# Patient Record
Sex: Male | Born: 1945
Health system: Southern US, Community
[De-identification: ages and names within clinical notes are randomized; demographics above are authoritative.]

## PROBLEM LIST (undated history)

## (undated) DIAGNOSIS — J449 Chronic obstructive pulmonary disease, unspecified: Secondary | ICD-10-CM

## (undated) DIAGNOSIS — G4733 Obstructive sleep apnea (adult) (pediatric): Secondary | ICD-10-CM

## (undated) DIAGNOSIS — I739 Peripheral vascular disease, unspecified: Secondary | ICD-10-CM

## (undated) DIAGNOSIS — I872 Venous insufficiency (chronic) (peripheral): Secondary | ICD-10-CM

## (undated) DIAGNOSIS — I10 Essential (primary) hypertension: Secondary | ICD-10-CM

## (undated) DIAGNOSIS — I639 Cerebral infarction, unspecified: Secondary | ICD-10-CM

## (undated) DIAGNOSIS — J189 Pneumonia, unspecified organism: Secondary | ICD-10-CM

## (undated) DIAGNOSIS — F1011 Alcohol abuse, in remission: Secondary | ICD-10-CM

## (undated) DIAGNOSIS — I219 Acute myocardial infarction, unspecified: Secondary | ICD-10-CM

## (undated) DIAGNOSIS — R7303 Prediabetes: Secondary | ICD-10-CM

## (undated) DIAGNOSIS — Z9981 Dependence on supplemental oxygen: Secondary | ICD-10-CM

## (undated) DIAGNOSIS — M1990 Unspecified osteoarthritis, unspecified site: Secondary | ICD-10-CM

## (undated) DIAGNOSIS — I82409 Acute embolism and thrombosis of unspecified deep veins of unspecified lower extremity: Secondary | ICD-10-CM

## (undated) DIAGNOSIS — I2510 Atherosclerotic heart disease of native coronary artery without angina pectoris: Secondary | ICD-10-CM

## (undated) DIAGNOSIS — I509 Heart failure, unspecified: Secondary | ICD-10-CM

## (undated) DIAGNOSIS — E785 Hyperlipidemia, unspecified: Secondary | ICD-10-CM

## (undated) DIAGNOSIS — R0600 Dyspnea, unspecified: Secondary | ICD-10-CM

## (undated) DIAGNOSIS — E119 Type 2 diabetes mellitus without complications: Secondary | ICD-10-CM

## (undated) HISTORY — DX: Obstructive sleep apnea (adult) (pediatric): G47.33

## (undated) HISTORY — DX: Chronic obstructive pulmonary disease, unspecified: J44.9

## (undated) HISTORY — PX: NOSE SURGERY: SHX723

## (undated) HISTORY — PX: ROTATOR CUFF REPAIR: SHX139

## (undated) HISTORY — DX: Essential (primary) hypertension: I10

## (undated) HISTORY — DX: Hyperlipidemia, unspecified: E78.5

## (undated) HISTORY — DX: Atherosclerotic heart disease of native coronary artery without angina pectoris: I25.10

## (undated) HISTORY — PX: TOTAL HIP ARTHROPLASTY: SHX124

## (undated) HISTORY — PX: LOBECTOMY: SHX5089

## (undated) HISTORY — PX: SPINE SURGERY: SHX786

## (undated) HISTORY — DX: Acute embolism and thrombosis of unspecified deep veins of unspecified lower extremity: I82.409

## (undated) HISTORY — PX: JOINT REPLACEMENT: SHX530

## (undated) HISTORY — DX: Alcohol abuse, in remission: F10.11

## (undated) HISTORY — DX: Heart failure, unspecified: I50.9

## (undated) HISTORY — PX: NEPHRECTOMY: SHX65

## (undated) HISTORY — DX: Cerebral infarction, unspecified: I63.9

## (undated) HISTORY — DX: Venous insufficiency (chronic) (peripheral): I87.2

## (undated) HISTORY — DX: Acute myocardial infarction, unspecified: I21.9
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
## 2003-11-19 ENCOUNTER — Other Ambulatory Visit: Payer: Self-pay

## 2003-12-26 DIAGNOSIS — F1011 Alcohol abuse, in remission: Secondary | ICD-10-CM

## 2003-12-26 HISTORY — DX: Alcohol abuse, in remission: F10.11

## 2004-10-25 ENCOUNTER — Ambulatory Visit (HOSPITAL_COMMUNITY): Admission: RE | Admit: 2004-10-25 | Discharge: 2004-10-25 | Payer: Self-pay | Admitting: Neurosurgery

## 2004-11-04 ENCOUNTER — Encounter: Payer: Self-pay | Admitting: Neurosurgery

## 2004-11-24 ENCOUNTER — Encounter: Payer: Self-pay | Admitting: Neurosurgery

## 2005-02-08 ENCOUNTER — Ambulatory Visit: Payer: Self-pay | Admitting: Anesthesiology

## 2005-03-02 ENCOUNTER — Ambulatory Visit: Payer: Self-pay | Admitting: Anesthesiology

## 2005-04-13 ENCOUNTER — Ambulatory Visit: Payer: Self-pay | Admitting: Anesthesiology

## 2005-05-11 ENCOUNTER — Ambulatory Visit: Payer: Self-pay | Admitting: Anesthesiology

## 2005-05-25 ENCOUNTER — Ambulatory Visit (HOSPITAL_COMMUNITY): Admission: RE | Admit: 2005-05-25 | Discharge: 2005-05-25 | Payer: Self-pay | Admitting: Pediatrics

## 2005-05-31 ENCOUNTER — Ambulatory Visit: Payer: Self-pay | Admitting: Pain Medicine

## 2005-08-30 ENCOUNTER — Other Ambulatory Visit: Payer: Self-pay

## 2005-08-30 ENCOUNTER — Ambulatory Visit: Payer: Self-pay | Admitting: Urology

## 2005-10-15 ENCOUNTER — Encounter: Admission: RE | Admit: 2005-10-15 | Discharge: 2005-10-15 | Payer: Self-pay | Admitting: Neurosurgery

## 2005-11-10 ENCOUNTER — Ambulatory Visit (HOSPITAL_COMMUNITY): Admission: RE | Admit: 2005-11-10 | Discharge: 2005-11-10 | Payer: Self-pay | Admitting: Neurosurgery

## 2005-12-25 HISTORY — PX: LUNG SURGERY: SHX703

## 2006-09-13 ENCOUNTER — Encounter: Payer: Self-pay | Admitting: Cardiovascular Disease

## 2007-05-03 ENCOUNTER — Ambulatory Visit: Payer: Self-pay | Admitting: General Practice

## 2007-05-03 ENCOUNTER — Other Ambulatory Visit: Payer: Self-pay

## 2007-05-17 ENCOUNTER — Ambulatory Visit: Payer: Self-pay | Admitting: General Practice

## 2007-05-31 ENCOUNTER — Encounter: Payer: Self-pay | Admitting: General Practice

## 2007-06-25 ENCOUNTER — Encounter: Payer: Self-pay | Admitting: General Practice

## 2008-07-15 ENCOUNTER — Emergency Department: Payer: Self-pay | Admitting: Emergency Medicine

## 2008-12-25 DIAGNOSIS — I639 Cerebral infarction, unspecified: Secondary | ICD-10-CM

## 2008-12-25 HISTORY — DX: Cerebral infarction, unspecified: I63.9

## 2009-11-16 DIAGNOSIS — I219 Acute myocardial infarction, unspecified: Secondary | ICD-10-CM

## 2009-11-16 HISTORY — DX: Acute myocardial infarction, unspecified: I21.9

## 2010-01-20 ENCOUNTER — Inpatient Hospital Stay: Payer: Self-pay | Admitting: Internal Medicine

## 2010-01-26 LAB — PULMONARY FUNCTION TEST

## 2010-02-10 ENCOUNTER — Encounter: Payer: Self-pay | Admitting: Cardiovascular Disease

## 2010-02-10 LAB — PULMONARY FUNCTION TEST

## 2010-08-09 ENCOUNTER — Encounter: Payer: Self-pay | Admitting: Cardiovascular Disease

## 2010-08-09 LAB — PULMONARY FUNCTION TEST

## 2010-10-24 ENCOUNTER — Inpatient Hospital Stay: Payer: Self-pay | Admitting: Internal Medicine

## 2011-03-01 ENCOUNTER — Ambulatory Visit: Payer: Self-pay | Admitting: Internal Medicine

## 2011-03-28 ENCOUNTER — Encounter: Payer: Self-pay | Admitting: Internal Medicine

## 2011-04-25 ENCOUNTER — Encounter: Payer: Self-pay | Admitting: Internal Medicine

## 2011-06-01 ENCOUNTER — Ambulatory Visit: Payer: Self-pay | Admitting: Internal Medicine

## 2011-08-29 ENCOUNTER — Other Ambulatory Visit: Payer: Self-pay | Admitting: Internal Medicine

## 2011-09-27 ENCOUNTER — Ambulatory Visit (INDEPENDENT_AMBULATORY_CARE_PROVIDER_SITE_OTHER): Payer: MEDICARE | Admitting: Internal Medicine

## 2011-09-27 ENCOUNTER — Encounter: Payer: Self-pay | Admitting: Internal Medicine

## 2011-09-27 VITALS — BP 119/76 | HR 77 | Temp 98.1°F | Resp 16 | Ht 72.0 in | Wt 276.8 lb

## 2011-09-27 DIAGNOSIS — I2589 Other forms of chronic ischemic heart disease: Secondary | ICD-10-CM

## 2011-09-27 DIAGNOSIS — E785 Hyperlipidemia, unspecified: Secondary | ICD-10-CM

## 2011-09-27 DIAGNOSIS — J449 Chronic obstructive pulmonary disease, unspecified: Secondary | ICD-10-CM | POA: Insufficient documentation

## 2011-09-27 DIAGNOSIS — I509 Heart failure, unspecified: Secondary | ICD-10-CM

## 2011-09-27 DIAGNOSIS — J3081 Allergic rhinitis due to animal (cat) (dog) hair and dander: Secondary | ICD-10-CM

## 2011-09-27 DIAGNOSIS — I10 Essential (primary) hypertension: Secondary | ICD-10-CM

## 2011-09-27 DIAGNOSIS — I5030 Unspecified diastolic (congestive) heart failure: Secondary | ICD-10-CM | POA: Insufficient documentation

## 2011-09-27 DIAGNOSIS — I255 Ischemic cardiomyopathy: Secondary | ICD-10-CM

## 2011-09-27 MED ORDER — CARVEDILOL 6.25 MG PO TABS: 6.2500 mg | ORAL_TABLET | Freq: Two times a day (BID) | ORAL | Status: DC

## 2011-09-27 MED ORDER — HYDRALAZINE HCL 25 MG PO TABS: 25.0000 mg | ORAL_TABLET | Freq: Two times a day (BID) | ORAL | Status: DC

## 2011-09-27 MED ORDER — LISINOPRIL 20 MG PO TABS: 20.0000 mg | ORAL_TABLET | Freq: Every day | ORAL | Status: DC

## 2011-09-27 MED ORDER — CLOPIDOGREL BISULFATE 75 MG PO TABS: 75.0000 mg | ORAL_TABLET | Freq: Every day | ORAL | Status: DC

## 2011-09-27 MED ORDER — FLUTICASONE-SALMETEROL 250-50 MCG/DOSE IN AEPB: 1.0000 | INHALATION_SPRAY | Freq: Two times a day (BID) | RESPIRATORY_TRACT | Status: DC

## 2011-09-27 MED ORDER — IPRATROPIUM-ALBUTEROL 18-103 MCG/ACT IN AERO: 2.0000 | INHALATION_SPRAY | Freq: Four times a day (QID) | RESPIRATORY_TRACT | Status: DC | PRN

## 2011-09-27 MED ORDER — SIMVASTATIN 20 MG PO TABS: 20.0000 mg | ORAL_TABLET | Freq: Every day | ORAL | Status: DC

## 2011-09-27 MED ORDER — HYDROCHLOROTHIAZIDE 25 MG PO TABS: 25.0000 mg | ORAL_TABLET | Freq: Every day | ORAL | Status: DC

## 2011-09-27 MED ORDER — CICLESONIDE 50 MCG/ACT NA SUSP: 2.0000 | Freq: Every day | NASAL | Status: DC

## 2011-09-27 MED ORDER — ISOSORBIDE MONONITRATE ER 60 MG PO TB24: 60.0000 mg | ORAL_TABLET | Freq: Every day | ORAL | Status: DC

## 2011-09-27 NOTE — Patient Instructions (Addendum)
Use Afrin sparingly, just once daily to prevent dependence on it.  We are going to try Omnaris, nasal spray  For your congestion and sneezing.  Use 2 squirts in each nostril once a day.  Your blood pressure is well controlled.    We will set you up with the [place] [facility] doctors in the next few months., and change your kidney doctor to [facility] nephrology.  They have an office in [place].

## 2011-09-27 NOTE — Assessment & Plan Note (Signed)
Ischemic cardiomyopathy, with EF of 35% by prior ECHO.  Reviewed the necessity of each of the medications he is taking,  He has requested a change in cardiologist  Will refer to [facility] for followup.

## 2011-09-27 NOTE — Assessment & Plan Note (Signed)
Well-controlled on current regimen. No changes today. 
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
## 2011-09-27 NOTE — Assessment & Plan Note (Signed)
Currently asymptomatic on current regimen.  He is requesting a change in pulmonologist .  Will refer to Dr. Kendrick Fries.

## 2011-09-28 ENCOUNTER — Other Ambulatory Visit: Payer: Self-pay | Admitting: Internal Medicine

## 2011-10-02 ENCOUNTER — Encounter: Payer: Self-pay | Admitting: Internal Medicine

## 2011-10-05 ENCOUNTER — Telehealth: Payer: Self-pay | Admitting: Pulmonary Disease

## 2011-10-05 NOTE — Telephone Encounter (Signed)
Noted  

## 2011-10-06 NOTE — Telephone Encounter (Signed)
Verlon Au, do you need anything else regarding this pt or msg?

## 2011-10-09 ENCOUNTER — Encounter: Payer: Self-pay | Admitting: Pulmonary Disease

## 2011-10-09 ENCOUNTER — Ambulatory Visit (INDEPENDENT_AMBULATORY_CARE_PROVIDER_SITE_OTHER): Payer: Medicare Other | Admitting: Pulmonary Disease

## 2011-10-09 VITALS — BP 126/72 | HR 84 | Temp 98.2°F | Ht 72.0 in | Wt 276.0 lb

## 2011-10-09 DIAGNOSIS — J310 Chronic rhinitis: Secondary | ICD-10-CM

## 2011-10-09 DIAGNOSIS — Z23 Encounter for immunization: Secondary | ICD-10-CM

## 2011-10-09 DIAGNOSIS — J449 Chronic obstructive pulmonary disease, unspecified: Secondary | ICD-10-CM

## 2011-10-09 MED ORDER — IPRATROPIUM BROMIDE 0.03 % NA SOLN: 2.0000 | Freq: Three times a day (TID) | NASAL | Status: DC | PRN

## 2011-10-09 NOTE — Progress Notes (Deleted)
  Subjective:    Patient ID: ATOM SOLIVAN, male    DOB: June 21, 1946, 65 y.o.   MRN: [id number]  HPI    Review of Systems  Constitutional: Negative for fever, chills, activity change, appetite change and unexpected weight change.  HENT: Positive for congestion. Negative for sore throat, rhinorrhea, sneezing, trouble swallowing, dental problem, voice change and postnasal drip.   Eyes: Negative for visual disturbance.  Respiratory: Positive for shortness of breath. Negative for cough and choking.   Cardiovascular: Negative for chest pain and leg swelling.  Gastrointestinal: Negative for nausea, vomiting and abdominal pain.  Genitourinary: Negative for difficulty urinating.  Musculoskeletal: Negative for arthralgias.  Skin: Negative for rash.  Psychiatric/Behavioral: Negative for behavioral problems and confusion.       Objective:   Physical Exam        Assessment & Plan:

## 2011-10-09 NOTE — Progress Notes (Signed)
Subjective:    Patient ID: Victor Daniels, male    DOB: 1946/12/05, 65 y.o.   MRN: [id number]  HPI 65 year old male with COPD, prior MI and stroke in 2010 presented to our office today for further evaluation of his COPD.  He states that he smoked heavily until 2010 when he had his stroke and heart attack.  At that point he quit smoking cigarettes and drinking alcohol and was referred to a pulmonologist (Dr. Mayo Ao) for likely COPD.  He states that he was initially placed on continuous oxygen therapy and inhalers at that point.  He began exercising on a regular basis at that point.  He now walks two miles per day every day while walking his dog, and notes that he usually can't go further than that due to burning in his lungs.  He says that cough does not bother him.  He does not have chest pain.  He notes a constant, daily, clear runny nose that is continuous and is refractory to 11-12 days of what sounds like appropriate use of a nasal steroid.  He does have some swelling in his legs which he says has improved with exercise over the last two years.    Review of Systems  Constitutional: Negative.  Negative for fever, fatigue and unexpected weight change.  HENT: Positive for congestion, rhinorrhea and postnasal drip.   Eyes: Negative.   Respiratory: Positive for chest tightness, shortness of breath and wheezing. Negative for cough.   Cardiovascular: Positive for leg swelling. Negative for chest pain.  Gastrointestinal: Negative.   Genitourinary: Negative.   Musculoskeletal: Negative.   Skin: Negative.   Neurological: Negative.   Hematological: Negative.   Psychiatric/Behavioral: Negative.        Objective:   Physical Exam Gen: well appearing, no acute distress HEENT: NCAT, PERRL, EOMi, OP clear, neck supple without masses PULM: Good air movement, few scattered insp crackles in bases bilaterally CV: RRR, II/VII systolic murmur LUSB, no JVD AB: BS+, soft, nontender, no hsm Ext:  warm, no clubbing or cyanosis; he does have chronic venous stasis changes in his legs bilaterally Derm: no rash or skin breakdown Neuro: A&Ox4, CN II-XII intact, strength 5/5 in all 4 extremeties        Assessment & Plan:  Impression:  1) COPD 2) CHF 3) HTN 4) Obesity 5) Vasomotor Rhinitis.  Mr. Brum is a very pleasant 65 y/o male with severe COPD and vasomotor rhinitis here for evaluation of the same.  His most recent pulmonary function tests show an FEV1 of 33% predicted, consistent with Gold Stage III, severe COPD.  His most significant symptom is shortness of breath with exertion and inability to do "what he used to do".  Of note, he walks two miles daily which is impressive for someone of his age with severe lung obstruction.  I explained to him that considering his lung function, he may derive some benefit from the addition of tiotropium to his regimen in addition to continued weight loss and exercise.  A recent Cochrane review metanalysis showed that combination therapy in patients with class II-IV COPD derived some benefit in post bronchodilator FEV1 and quality of life Yvetta Coder CJ, Cochrane Database Syst Rev. 2012).    His continuous rhinorrhea does not appear to be responding to nasal steroid use (he claims to be using the med appropriately).  The clear, continuous drainage is consistent with vasomotor rhinitis and merits a trial of nasal ipratropium.    1) COPD: GOLD  Stage III Combined recommendations from the [employer], [employer] of  Chest Physicians, Designer, television/film set, European Respiratory Society (Qaseem A et al,  Ann Intern Med. 2011;155(3):179) recommends tobacco cessation, pulmonary rehab (for symptomatic patients with an FEV1 < 50% predicted), supplemental oxygen (for patients with SaO2 <88% or paO2 <55), and appropriate bronchodilator therapy.  In regards to long acting bronchodilators, they recommend monotherapy (FEV1 60-80%  with symptoms weak evidence,  FEV1 with symptoms <60% strong evidence), or combination therapy (FEV1 <60% with symptoms, strong recommendation, moderate evidence). One should also provide patients with annual immunizations and consider therapy for prevention of COPD exacerbations (ie. roflumilast or azithromycin) when appopriate. -O2 therapy: O2 therapy not indicated -Immunizations: Flu shot updated today -Tobacco use: quit since 2010 -Exercise: has undergone pulmonary rehab, currently exercises daily -Bronchodilator therapy: currently on fluticasone/salmeterol which is appropriate, I suggested  tiotropium as noted above, but he would prefer to lose weight and push his exercise routine  before trying this. -Exacerbation prevention: currently not indicated  2) Vasomotor rhinitis (chronic non-allergic rhinitis): When inhaled corticosteroids or antihistamines are not helpful, ipratropium nasal spray (0.03%, 2 puffs each nare tid) is often effective as demonstrated in two trials of 285 and 253 patients (J Allergy Clin Immunol. 1610;96(0 Pt 2):1123. nd J Allergy Clin Immunol. 4540;98(1 Pt 2):1117). -d/c nasal steroid (samples given recently) -start ipratropium nasal spray (0.03%) 2 puffs bid-tid prn  3) RTC 2 months

## 2011-10-09 NOTE — Patient Instructions (Addendum)
1) Continue using your Advair and Combivent as you are doing 2) Continue exercising on a regular basis as you are doing 3) Gradually increase your exercise to a light jog as you plan, but stop if you have excessive shortness of breath or chest pain.  4) Notify our office or any of your doctors if you have chest pain with exertion 5) Flu shot today 6) Start taking ipratropium nasal spray 2 puffs two to three times a day for your runny nose 7) We will see you back in 2 months

## 2011-10-10 NOTE — Progress Notes (Signed)
Called [facility] and gave verbal prescription for atrovent nasal spray, 2 puffs ea nare tid, 3 bottles, refills: 3

## 2011-10-12 ENCOUNTER — Encounter: Payer: Self-pay | Admitting: Cardiovascular Disease

## 2011-10-12 ENCOUNTER — Ambulatory Visit (INDEPENDENT_AMBULATORY_CARE_PROVIDER_SITE_OTHER): Payer: Medicare Other | Admitting: Cardiovascular Disease

## 2011-10-12 DIAGNOSIS — I509 Heart failure, unspecified: Secondary | ICD-10-CM

## 2011-10-12 DIAGNOSIS — I10 Essential (primary) hypertension: Secondary | ICD-10-CM

## 2011-10-12 DIAGNOSIS — E785 Hyperlipidemia, unspecified: Secondary | ICD-10-CM

## 2011-10-12 DIAGNOSIS — J449 Chronic obstructive pulmonary disease, unspecified: Secondary | ICD-10-CM

## 2011-10-12 DIAGNOSIS — I428 Other cardiomyopathies: Secondary | ICD-10-CM

## 2011-10-12 DIAGNOSIS — I2510 Atherosclerotic heart disease of native coronary artery without angina pectoris: Secondary | ICD-10-CM

## 2011-10-12 NOTE — Assessment & Plan Note (Signed)
Symptoms suggest mild COPD. He is improved on his inhalers including Advair.

## 2011-10-12 NOTE — Progress Notes (Signed)
Patient ID: Victor Daniels, male    DOB: [birth year], 65 y.o.   MRN: [id number]  HPI Comments: 65 year old gentleman referred by Dr. Darrick Huntsman, with a history of coronary artery disease, non-Q wave MI in the setting of multiorgan failure in November 2010 in [place], at that time with congestive heart failure and LV dysfunction with ejection fraction 25%, COPD, single kidney with previous renal failure in. On dialysis, CVA x2 with short-term memory loss, history of heavy alcohol abuse and smoking who presents to establish care.  He reports that he did have a significant problem with alcohol and was a heavy smoker though he quit in November. He does not remember much of the details and he was hospitalized in [place] in 2010. He was told that he had a heart attack but he does not remember having a cardiac catheterization. Notes indicate he had a non-Q-wave MI that was likely secondary to multiorgan failure. His low ejection fraction estimated at 25% several years ago has not been reevaluated since that time.   He reports that he exercises on a regular basis, does have mild shortness of breath with heavy exertion, occasional chest tightness with heavy exertion. His lower extremity edema him a which had been chronic with signs of chronic skin changes from venous insufficiency, has significantly improved. He has not been smoking for several years. He does have significant bruising from Plavix. He was off isosorbide for a period of time when this ran out though this was renewed and he has started taking this again.   EKG shows normal sinus rhythm with rate 77 beats per minute with incomplete right bundle branch block, no significant ST or T wave changes   Outpatient Encounter Prescriptions as of 10/12/2011  Medication Sig Dispense Refill  . albuterol-ipratropium (COMBIVENT) 18-103 MCG/ACT inhaler Inhale 2 puffs into the lungs every 6 (six) hours as needed.  3 Inhaler  3  . carvedilol (COREG) 6.25 MG  tablet Take 1 tablet (6.25 mg total) by mouth 2 (two) times daily with a meal.  180 tablet  3  . ciclesonide (OMNARIS) 50 MCG/ACT nasal spray Place 2 sprays into both nostrils daily.  12.5 g  0  . clopidogrel (PLAVIX) 75 MG tablet Take 1 tablet (75 mg total) by mouth daily.  90 tablet  3  . Fluticasone-Salmeterol (ADVAIR DISKUS) 250-50 MCG/DOSE AEPB Inhale 1 puff into the lungs 2 (two) times daily.  60 each  3  . hydrALAZINE (APRESOLINE) 25 MG tablet Take 1 tablet (25 mg total) by mouth 2 (two) times daily.  180 tablet  3  . hydrochlorothiazide (HYDRODIURIL) 25 MG tablet Take 1 tablet (25 mg total) by mouth daily.  90 tablet  3  . isosorbide mononitrate (IMDUR) 60 MG 24 hr tablet Take 1 tablet (60 mg total) by mouth daily.  90 tablet  3  . lisinopril (PRINIVIL,ZESTRIL) 20 MG tablet Take 1 tablet (20 mg total) by mouth daily.  90 tablet  3  . simvastatin (ZOCOR) 20 MG tablet Take 1 tablet (20 mg total) by mouth at bedtime.  90 tablet  3  . ipratropium (ATROVENT) 0.03 % nasal spray Place 2 sprays into the nose 3 (three) times daily as needed for rhinitis. prn  90 mL  3     Review of Systems  Constitutional: Negative.   HENT: Negative.   Eyes: Negative.   Respiratory: Positive for shortness of breath.   Cardiovascular: Positive for chest pain.  Gastrointestinal: Negative.   Musculoskeletal:  Negative.   Skin: Negative.   Neurological: Negative.   Hematological: Negative.   Psychiatric/Behavioral: Negative.   All other systems reviewed and are negative.    BP 142/83  Pulse 77  Ht 6\' 1"  (1.854 m)  Wt 274 lb (124.286 kg)  BMI 36.15 kg/m2  Physical Exam  Nursing note and vitals reviewed. Constitutional: He is oriented to person, place, and time. He appears well-developed and well-nourished.       obesity  HENT:  Head: Normocephalic.  Nose: Nose normal.  Mouth/Throat: Oropharynx is clear and moist.  Eyes: Conjunctivae are normal. Pupils are equal, round, and reactive to light.    Neck: Normal range of motion. Neck supple. No JVD present.  Cardiovascular: Normal rate, regular rhythm, S1 normal, S2 normal, normal heart sounds and intact distal pulses.  Exam reveals no gallop and no friction rub.   No murmur heard.      Discoloration of his lower extremities secondary to venous insufficiency, chronic  Pulmonary/Chest: Effort normal and breath sounds normal. No respiratory distress. He has no wheezes. He has no rales. He exhibits no tenderness.  Abdominal: Soft. Bowel sounds are normal. He exhibits no distension. There is no tenderness.  Musculoskeletal: Normal range of motion. He exhibits edema. He exhibits no tenderness.  Lymphadenopathy:    He has no cervical adenopathy.  Neurological: He is alert and oriented to person, place, and time. Coordination normal.  Skin: Skin is warm and dry. No rash noted. No erythema.  Psychiatric: He has a normal mood and affect. His behavior is normal. Judgment and thought content normal.           Assessment and Plan

## 2011-10-12 NOTE — Assessment & Plan Note (Signed)
Blood pressure is well controlled on today's visit. No changes made to the medications. 

## 2011-10-12 NOTE — Assessment & Plan Note (Signed)
Given his history of non-Q-wave MI, lung history of smoking, obesity, ideally  goal LDL should be close to 70. He is scheduled to have lab work done in followup with Dr. Darrick Huntsman

## 2011-10-12 NOTE — Patient Instructions (Addendum)
You are doing well. No medication changes were made. Please call us if you have new issues that need to be addressed before your next appt.  The office will contact you for a follow up Appt. In 6 months  Your physician has requested that you have an echocardiogram. Echocardiography is a painless test that uses sound waves to create images of your heart. It provides your doctor with information about the size and shape of your heart and how well your heart's chambers and valves are working. This procedure takes approximately one hour. There are no restrictions for this procedure.

## 2011-10-12 NOTE — Assessment & Plan Note (Signed)
I suspect his depressed ejection fraction several years ago could have been secondary to significant alcohol intake. We have ordered an echocardiogram to reevaluate his LV function given the significant prior cardiomyopathy. If his ejection fraction has improved, we would not need to consider an ICD.

## 2011-10-12 NOTE — Assessment & Plan Note (Signed)
Details of his non-Q-wave MI in 2010 are unavailable. We'll try to obtain these records for our system.  I suspect given his long smoking history, he does have underlying coronary artery disease and in the setting of multiorgan failure, he had elevation of his cardiac enzymes. He is unaware if he had a cardiac catheterization at that time or not. As he is essentially pain-free and is exercising without complaints, we will hold off on any stress testing until we have all of his records.

## 2011-10-17 ENCOUNTER — Other Ambulatory Visit: Payer: Medicare Other | Admitting: *Deleted

## 2011-10-26 ENCOUNTER — Ambulatory Visit: Payer: Medicare Other | Admitting: Internal Medicine

## 2011-10-26 ENCOUNTER — Emergency Department: Payer: Self-pay | Admitting: Emergency Medicine

## 2011-10-26 ENCOUNTER — Encounter: Payer: Self-pay | Admitting: Internal Medicine

## 2011-10-29 ENCOUNTER — Encounter: Payer: Self-pay | Admitting: Internal Medicine

## 2011-10-31 ENCOUNTER — Encounter: Payer: Self-pay | Admitting: Internal Medicine

## 2011-10-31 ENCOUNTER — Other Ambulatory Visit (INDEPENDENT_AMBULATORY_CARE_PROVIDER_SITE_OTHER): Payer: Medicare Other | Admitting: *Deleted

## 2011-10-31 ENCOUNTER — Ambulatory Visit (INDEPENDENT_AMBULATORY_CARE_PROVIDER_SITE_OTHER): Payer: Medicare Other | Admitting: Internal Medicine

## 2011-10-31 VITALS — BP 114/70 | HR 90 | Temp 98.3°F | Resp 16 | Ht 73.0 in | Wt 275.8 lb

## 2011-10-31 DIAGNOSIS — R0602 Shortness of breath: Secondary | ICD-10-CM

## 2011-10-31 DIAGNOSIS — I872 Venous insufficiency (chronic) (peripheral): Secondary | ICD-10-CM

## 2011-10-31 DIAGNOSIS — R51 Headache: Secondary | ICD-10-CM

## 2011-10-31 DIAGNOSIS — R6889 Other general symptoms and signs: Secondary | ICD-10-CM

## 2011-10-31 NOTE — Patient Instructions (Addendum)
Stop the hydralazine to see if the headaches resolve.  Check your blood pressure during one of these episodes to see iif it si elevated or low.    I am checking a blood test for the flu today.

## 2011-10-31 NOTE — Progress Notes (Signed)
Subjective:    Patient ID: Victor Daniels, male    DOB: 24-Aug-1946, 65 y.o.   MRN: [id number]  HPI   65 yo white male with history of dilated CM EF 25%, COPD, sent to ER last week after presenting to ffice with hypotension and near syncope.  He was evaluated, given IV fluids and antibiotics and sent home from the ER with a prescription for  Avelox for PNA.  Bps at home have been in the 115 systolic.  He feel better but not completely back to baseline.  HIs chief complaint today is leg pain.  He states that both lower legs have been very sensitive to the touch and feel like they are burning.  The sensation is constant and has been present for 2 years. He saw Dr.  Wyn Quaker 2 yrs ago but no intervention was done because his condition wasn't severe enough. His 2nd  complaint is the occurrence of  twice daily flushing and severe headache which occur about 15 minutes after he takes his meds and last about 30 minutes.     Past Medical History  Diagnosis Date  . COPD (chronic obstructive pulmonary disease)   . CHF (congestive heart failure)     ischemic CM.  EF 25%  . Hyperlipidemia   . Hypertension   . Heart attack 11/16/09  . OSA (obstructive sleep apnea)     not on CPAP  . DVT (deep venous thrombosis)   . Alcoholic gastritis   . CVA (cerebral infarction)     residual short term memory loss  . CAD (coronary artery disease)     Current Outpatient Prescriptions on File Prior to Visit  Medication Sig Dispense Refill  . albuterol-ipratropium (COMBIVENT) 18-103 MCG/ACT inhaler Inhale 2 puffs into the lungs every 6 (six) hours as needed.  3 Inhaler  3  . carvedilol (COREG) 6.25 MG tablet Take 1 tablet (6.25 mg total) by mouth 2 (two) times daily with a meal.  180 tablet  3  . clopidogrel (PLAVIX) 75 MG tablet Take 1 tablet (75 mg total) by mouth daily.  90 tablet  3  . Fluticasone-Salmeterol (ADVAIR DISKUS) 250-50 MCG/DOSE AEPB Inhale 1 puff into the lungs 2 (two) times daily.  60 each  3  .  hydrALAZINE (APRESOLINE) 25 MG tablet Take 1 tablet (25 mg total) by mouth 2 (two) times daily.  180 tablet  3  . hydrochlorothiazide (HYDRODIURIL) 25 MG tablet Take 1 tablet (25 mg total) by mouth daily.  90 tablet  3  . ipratropium (ATROVENT) 0.03 % nasal spray Place 2 sprays into the nose 3 (three) times daily as needed for rhinitis. prn  90 mL  3  . isosorbide mononitrate (IMDUR) 60 MG 24 hr tablet Take 1 tablet (60 mg total) by mouth daily.  90 tablet  3  . lisinopril (PRINIVIL,ZESTRIL) 20 MG tablet Take 1 tablet (20 mg total) by mouth daily.  90 tablet  3  . simvastatin (ZOCOR) 20 MG tablet Take 1 tablet (20 mg total) by mouth at bedtime.  90 tablet  3     Review of Systems  Constitutional: Negative for fever, chills, diaphoresis, activity change, appetite change, fatigue and unexpected weight change.  HENT: Negative for hearing loss, ear pain, nosebleeds, congestion, sore throat, facial swelling, rhinorrhea, sneezing, drooling, mouth sores, trouble swallowing, neck pain, neck stiffness, dental problem, voice change, postnasal drip, sinus pressure, tinnitus and ear discharge.   Eyes: Negative for photophobia, pain, discharge, redness, itching and visual disturbance.  Respiratory: Negative for apnea, cough, choking, chest tightness, shortness of breath, wheezing and stridor.   Cardiovascular: Negative for chest pain, palpitations and leg swelling.  Gastrointestinal: Negative for nausea, vomiting, abdominal pain, diarrhea, constipation, blood in stool, abdominal distention, anal bleeding and rectal pain.  Genitourinary: Negative for dysuria, urgency, frequency, hematuria, flank pain, decreased urine volume, scrotal swelling, difficulty urinating and testicular pain.  Musculoskeletal: Negative for myalgias, back pain, joint swelling, arthralgias and gait problem.  Skin: Negative for color change, rash and wound.  Neurological: Negative for dizziness, tremors, seizures, syncope, speech  difficulty, weakness, light-headedness, numbness and headaches.  Psychiatric/Behavioral: Negative for suicidal ideas, hallucinations, behavioral problems, confusion, sleep disturbance, dysphoric mood, decreased concentration and agitation. The patient is not nervous/anxious.    BP 114/70  Pulse 90  Temp(Src) 98.3 F (36.8 C) (Oral)  Resp 16  Ht 6\' 1"  (1.854 m)  Wt 275 lb 12 oz (125.079 kg)  BMI 36.38 kg/m2  SpO2 93%     Objective:   Physical Exam  Constitutional: He is oriented to person, place, and time.  HENT:  Head: Normocephalic and atraumatic.  Mouth/Throat: Oropharynx is clear and moist.  Eyes: Conjunctivae and EOM are normal.  Neck: Normal range of motion. Neck supple. No JVD present. No thyromegaly present.  Cardiovascular: Normal rate, regular rhythm and normal heart sounds.   Pulmonary/Chest: Effort normal and breath sounds normal. He has no wheezes. He has no rales.  Abdominal: Soft. Bowel sounds are normal. He exhibits no mass. There is no tenderness. There is no rebound.  Musculoskeletal: Normal range of motion. He exhibits no edema.  Neurological: He is alert and oriented to person, place, and time.  Skin: Skin is warm and dry.     Psychiatric: He has a normal mood and affect.          Assessment & Plan:   Pneumonia:  diagnosed during ER evaluation for hypotension :  He has been taking Avelox for treatment and notes improved blood pressures and symptoms.  He will need a 6 week followup chest x ray.  Venous insufficiency: he is requesting referral back to Dr. Wyn Quaker.  He is noncompliant with compression stockings.  Flushing,headaches:  I suspect that this is a reaction to imdur and hydralazine.  Will have him suspend Imdur for a week to see if symptoms resolve.

## 2011-11-06 ENCOUNTER — Encounter: Payer: Self-pay | Admitting: Cardiovascular Disease

## 2011-11-08 LAB — INFLUENZA B ABS IGG & IGM

## 2011-11-08 LAB — INFLUENZA A ABS IGG & IGM

## 2011-12-06 ENCOUNTER — Ambulatory Visit (INDEPENDENT_AMBULATORY_CARE_PROVIDER_SITE_OTHER): Payer: Medicare Other | Admitting: Pulmonary Disease

## 2011-12-06 ENCOUNTER — Encounter: Payer: Self-pay | Admitting: Pulmonary Disease

## 2011-12-06 DIAGNOSIS — J449 Chronic obstructive pulmonary disease, unspecified: Secondary | ICD-10-CM

## 2011-12-06 DIAGNOSIS — J300 Vasomotor rhinitis: Secondary | ICD-10-CM | POA: Insufficient documentation

## 2011-12-06 DIAGNOSIS — J309 Allergic rhinitis, unspecified: Secondary | ICD-10-CM

## 2011-12-06 NOTE — Assessment & Plan Note (Addendum)
GOLD stage III. Doing well on Advair and prn combivent.  Currently exercising more and says that he is not limited by shortness of breath.  I see no indication to change his medications. Recently treated for pneumonia, but he says that he did not have fever, chills, or productive cough.  At baseline he has pleural thickening and atelctasis/scarring in the R base. I see no significant change on the 10/26/11 film compared to a 05/2011 film, so no reason to repeat CXR. Return to clinic in 3 months.

## 2011-12-06 NOTE — Assessment & Plan Note (Signed)
Doing well on ipratropium nasal spray, continue.

## 2011-12-06 NOTE — Progress Notes (Signed)
Subjective:    Patient ID: Victor Daniels, male    DOB: 01-03-1946, 65 y.o.   MRN: [id number]  HPI 11/06/11 ROV --Very pleasant male with GOLD stage III COPD and vasomotor rhinitis presents for follow up.  He states that he has been exercising more and is not limited by shortness of breath.  He rarely uses his combivent inhaler more than once a day, twice is rare.  He has been raking leaves, walking for exercise.  He notes that the ipratropium nasal spray has been very helpful for his allergic rhinitis.  He was taken to [facility] on 10/26/11 for syncope and was treated for bronchitis with avelox, but it sounds like he didn't have cough, shortness of breath or fever.   Review of Systems     Objective:   Physical Exam Filed Vitals:   12/06/11 1332  BP: 124/72  Pulse: 73  Temp: 97.8 F (36.6 C)  TempSrc: Oral  Height: 6\' 1"  (1.854 m)  Weight: 274 lb (124.286 kg)  SpO2: 96%    Gen: well appearing, no acute distress HEENT: NCAT, PERRL, EOMi, OP clear, Neck: supple without masses PULM: CTA B CV: RRR, no mgr, no JVD Ext: warm, no clubbing, no cyanosis   I have personally reviewed the images 10/26/2011 CXR from [facility], see below.    Assessment & Plan:   COPD (chronic obstructive pulmonary disease) GOLD stage III. Doing well on Advair and prn combivent.  Currently exercising more and says that he is not limited by shortness of breath.  I see no indication to change his medications. Recently treated for pneumonia, but he says that he did not have fever, chills, or productive cough.  At baseline he has pleural thickening and atelctasis/scarring in the R base. I see no significant change on the 10/26/11 film compared to a 05/2011 film, so no reason to repeat CXR. Return to clinic in 3 months.  Vasomotor rhinitis Doing well on ipratropium nasal spray, continue.    Updated Medication List Outpatient Encounter Prescriptions as of 12/06/2011  Medication Sig Dispense Refill  .  albuterol (PROVENTIL HFA;VENTOLIN HFA) 108 (90 BASE) MCG/ACT inhaler Inhale 2 puffs into the lungs every 6 (six) hours as needed.        Marland Kitchen albuterol-ipratropium (COMBIVENT) 18-103 MCG/ACT inhaler Inhale 2 puffs into the lungs every 6 (six) hours as needed.  3 Inhaler  3  . carvedilol (COREG) 6.25 MG tablet Take 1 tablet (6.25 mg total) by mouth 2 (two) times daily with a meal.  180 tablet  3  . clopidogrel (PLAVIX) 75 MG tablet Take 1 tablet (75 mg total) by mouth daily.  90 tablet  3  . Fluticasone-Salmeterol (ADVAIR DISKUS) 250-50 MCG/DOSE AEPB Inhale 1 puff into the lungs 2 (two) times daily.  60 each  3  . hydrALAZINE (APRESOLINE) 25 MG tablet Take 1 tablet (25 mg total) by mouth 2 (two) times daily.  180 tablet  3  . hydrochlorothiazide (HYDRODIURIL) 25 MG tablet Take 1 tablet (25 mg total) by mouth daily.  90 tablet  3  . ipratropium (ATROVENT) 0.03 % nasal spray Place 2 sprays into the nose 3 (three) times daily as needed for rhinitis. prn  90 mL  3  . isosorbide mononitrate (IMDUR) 60 MG 24 hr tablet Take 1 tablet (60 mg total) by mouth daily.  90 tablet  3  . lisinopril (PRINIVIL,ZESTRIL) 20 MG tablet Take 1 tablet (20 mg total) by mouth daily.  90 tablet  3  .  moxifloxacin (AVELOX) 400 MG tablet Take 400 mg by mouth daily.        . simvastatin (ZOCOR) 20 MG tablet Take 1 tablet (20 mg total) by mouth at bedtime.  90 tablet  3

## 2011-12-06 NOTE — Patient Instructions (Signed)
Continue exercising regularly and taking your medications as written. We will see you back in 3 months, call sooner if questions or if you need to see [place].

## 2012-01-01 ENCOUNTER — Other Ambulatory Visit (INDEPENDENT_AMBULATORY_CARE_PROVIDER_SITE_OTHER): Payer: Medicare Other | Admitting: *Deleted

## 2012-01-01 ENCOUNTER — Telehealth: Payer: Self-pay | Admitting: *Deleted

## 2012-01-01 DIAGNOSIS — E785 Hyperlipidemia, unspecified: Secondary | ICD-10-CM

## 2012-01-01 DIAGNOSIS — I10 Essential (primary) hypertension: Secondary | ICD-10-CM

## 2012-01-01 LAB — LIPID PANEL
Cholesterol: 143 mg/dL (ref 0–200)
LDL Cholesterol: 79 mg/dL (ref 0–99)
Total CHOL/HDL Ratio: 3
VLDL: 15.8 mg/dL (ref 0.0–40.0)

## 2012-01-01 NOTE — Telephone Encounter (Signed)
Cmet, has a GFR as part of it,  Fasting lipids,

## 2012-01-01 NOTE — Telephone Encounter (Signed)
Labs ordered.

## 2012-01-01 NOTE — Telephone Encounter (Signed)
Patient is also asking if we can check his GFR level

## 2012-01-01 NOTE — Telephone Encounter (Signed)
Patient is coming in today for fasting labs. There is no order. What would you like for me to order.

## 2012-01-02 LAB — COMPREHENSIVE METABOLIC PANEL
ALT: 18 U/L (ref 0–53)
AST: 15 U/L (ref 0–37)
Albumin: 3.8 g/dL (ref 3.5–5.2)
Alkaline Phosphatase: 64 U/L (ref 39–117)
Potassium: 3.7 mEq/L (ref 3.5–5.1)
Sodium: 143 mEq/L (ref 135–145)
Total Bilirubin: 0.5 mg/dL (ref 0.3–1.2)
Total Protein: 7 g/dL (ref 6.0–8.3)

## 2012-01-08 ENCOUNTER — Encounter: Payer: Self-pay | Admitting: Internal Medicine

## 2012-01-08 ENCOUNTER — Telehealth: Payer: Self-pay | Admitting: *Deleted

## 2012-01-08 ENCOUNTER — Ambulatory Visit (INDEPENDENT_AMBULATORY_CARE_PROVIDER_SITE_OTHER): Payer: Medicare Other | Admitting: Internal Medicine

## 2012-01-08 DIAGNOSIS — I10 Essential (primary) hypertension: Secondary | ICD-10-CM

## 2012-01-08 DIAGNOSIS — I509 Heart failure, unspecified: Secondary | ICD-10-CM

## 2012-01-08 DIAGNOSIS — E785 Hyperlipidemia, unspecified: Secondary | ICD-10-CM

## 2012-01-08 DIAGNOSIS — I878 Other specified disorders of veins: Secondary | ICD-10-CM | POA: Insufficient documentation

## 2012-01-08 DIAGNOSIS — I872 Venous insufficiency (chronic) (peripheral): Secondary | ICD-10-CM

## 2012-01-08 NOTE — Patient Instructions (Addendum)
You can try stopping the hydralazine for blood pressure because it also causes fluid retention.  If your blood pressure rises and stays above 140,  Try increasing the carvedilol to 12.5 mg twice daily and recheck bp and pulse daily for monitoring  Goal HR above 60  And bp below 145/80

## 2012-01-08 NOTE — Telephone Encounter (Signed)
When I checked my medications I discovered that I had stopped taking hydralazine back in October. So it seems we need a new game plan....please advise.  (This is a message that patient sent through my chart)

## 2012-01-08 NOTE — Telephone Encounter (Signed)
We were stopping the hydralazine bc it was the least significant medication he was taking  All of his others are necessary for patients with coronary artery disease.  I would not stop any of them.

## 2012-01-08 NOTE — Assessment & Plan Note (Signed)
Secondary to ischemic cardiomyopathy, EF 35%, with no recent exacerbations.  No changes today.

## 2012-01-08 NOTE — Progress Notes (Signed)
Subjective:    Patient ID: Victor Daniels, male    DOB: 1946-05-02, 66 y.o.   MRN: [id number]  HPI  Victor Daniels is a 66 yo white male with a history of COPD secondary to prior tobacco abuse, CAD with ischemic cardioymopathy and reduced EF, and CVC with prior CVA who presents for follow up on general medical issues.  He feels generally well except for recurrent loss of balance, mild, which occurs with sudden position changes,  Particularly in bending over, wh ch is interfering with his yardwork.   He denies any falls in the last 6 months and denies vertigo  and presyncope since his episode of hypotension in the office several weeks ago during an episode of pneumonia. He had a recent vascular surgery follow up with venous Ultrasound by Dr. Wyn Quaker for venous insufficiency.  He refused the compression stockings prescribed by Dr. Wyn Quaker due to cost and difiuculty donning them and has been wearing the OTC type as well as cutting back on sodium in his diet.  Has venous stasis changes but no ulcer.    Past Medical History  Diagnosis Date  . COPD (chronic obstructive pulmonary disease)   . CHF (congestive heart failure)     ischemic CM.  EF 25%  . Hyperlipidemia   . Hypertension   . Heart attack 11/16/09  . OSA (obstructive sleep apnea)     not on CPAP  . DVT (deep venous thrombosis)   . Alcoholic gastritis   . CVA (cerebral infarction)     residual short term memory loss  . CAD (coronary artery disease)    Current Outpatient Prescriptions on File Prior to Visit  Medication Sig Dispense Refill  . albuterol (PROVENTIL HFA;VENTOLIN HFA) 108 (90 BASE) MCG/ACT inhaler Inhale 2 puffs into the lungs every 6 (six) hours as needed.        Marland Kitchen albuterol-ipratropium (COMBIVENT) 18-103 MCG/ACT inhaler Inhale 2 puffs into the lungs every 6 (six) hours as needed.  3 Inhaler  3  . carvedilol (COREG) 6.25 MG tablet Take 1 tablet (6.25 mg total) by mouth 2 (two) times daily with a meal.  180 tablet  3  .  clopidogrel (PLAVIX) 75 MG tablet Take 1 tablet (75 mg total) by mouth daily.  90 tablet  3  . Fluticasone-Salmeterol (ADVAIR DISKUS) 250-50 MCG/DOSE AEPB Inhale 1 puff into the lungs 2 (two) times daily.  60 each  3  . hydrochlorothiazide (HYDRODIURIL) 25 MG tablet Take 1 tablet (25 mg total) by mouth daily.  90 tablet  3  . ipratropium (ATROVENT) 0.03 % nasal spray Place 2 sprays into the nose 3 (three) times daily as needed for rhinitis. prn  90 mL  3  . isosorbide mononitrate (IMDUR) 60 MG 24 hr tablet Take 1 tablet (60 mg total) by mouth daily.  90 tablet  3  . lisinopril (PRINIVIL,ZESTRIL) 20 MG tablet Take 1 tablet (20 mg total) by mouth daily.  90 tablet  3  . simvastatin (ZOCOR) 20 MG tablet Take 1 tablet (20 mg total) by mouth at bedtime.  90 tablet  3  . hydrALAZINE (APRESOLINE) 25 MG tablet Take 1 tablet (25 mg total) by mouth 2 (two) times daily.  180 tablet  3  . moxifloxacin (AVELOX) 400 MG tablet Take 400 mg by mouth daily.          Review of Systems  Constitutional: Negative for fever, chills, diaphoresis, activity change, appetite change, fatigue and unexpected weight change.  HENT: Negative  for hearing loss, ear pain, nosebleeds, congestion, sore throat, facial swelling, rhinorrhea, sneezing, drooling, mouth sores, trouble swallowing, neck pain, neck stiffness, dental problem, voice change, postnasal drip, sinus pressure, tinnitus and ear discharge.   Eyes: Negative for photophobia, pain, discharge, redness, itching and visual disturbance.  Respiratory: Negative for apnea, cough, choking, chest tightness, shortness of breath, wheezing and stridor.   Cardiovascular: Negative for chest pain, palpitations and leg swelling.  Gastrointestinal: Negative for nausea, vomiting, abdominal pain, diarrhea, constipation, blood in stool, abdominal distention, anal bleeding and rectal pain.  Genitourinary: Negative for dysuria, urgency, frequency, hematuria, flank pain, decreased urine volume,  scrotal swelling, difficulty urinating and testicular pain.  Musculoskeletal: Negative for myalgias, back pain, joint swelling, arthralgias and gait problem.  Skin: Negative for color change, rash and wound.  Neurological: Positive for light-headedness. Negative for dizziness, tremors, seizures, syncope, speech difficulty, weakness, numbness and headaches.  Psychiatric/Behavioral: Negative for suicidal ideas, hallucinations, behavioral problems, confusion, sleep disturbance, dysphoric mood, decreased concentration and agitation. The patient is not nervous/anxious.        Objective:   Physical Exam  Constitutional: He is oriented to person, place, and time.  HENT:  Head: Normocephalic and atraumatic.  Mouth/Throat: Oropharynx is clear and moist.  Eyes: Conjunctivae and EOM are normal.  Neck: Normal range of motion. Neck supple. No JVD present. No thyromegaly present.  Cardiovascular: Normal rate, regular rhythm and normal heart sounds.   Pulmonary/Chest: Effort normal and breath sounds normal. He has no wheezes. He has no rales.  Abdominal: Soft. Bowel sounds are normal. He exhibits no mass. There is no tenderness. There is no rebound.  Musculoskeletal: Normal range of motion. He exhibits no edema.  Neurological: He is alert and oriented to person, place, and time.  Skin: Skin is warm and dry.     Psychiatric: He has a normal mood and affect.          Assessment & Plan:

## 2012-01-08 NOTE — Assessment & Plan Note (Signed)
Aggravated by use of vasodilators , improved since stopping hydralazine. Continue leg elevation , compression stockings.

## 2012-01-08 NOTE — Assessment & Plan Note (Signed)
Well controlled on current regimen. Renal function stable, no changes today. 

## 2012-01-08 NOTE — Assessment & Plan Note (Signed)
LDL near gol at 70 on simvastatin 20 mg .  LFTS stable ,  No changes.

## 2012-01-09 NOTE — Telephone Encounter (Signed)
Patient notified

## 2012-01-31 ENCOUNTER — Encounter: Payer: Self-pay | Admitting: Internal Medicine

## 2012-02-28 ENCOUNTER — Ambulatory Visit (INDEPENDENT_AMBULATORY_CARE_PROVIDER_SITE_OTHER): Payer: Medicare Other | Admitting: Pulmonary Disease

## 2012-02-28 ENCOUNTER — Encounter: Payer: Self-pay | Admitting: Pulmonary Disease

## 2012-02-28 VITALS — BP 118/82 | HR 73 | Temp 98.0°F | Ht 73.0 in | Wt 275.0 lb

## 2012-02-28 DIAGNOSIS — J449 Chronic obstructive pulmonary disease, unspecified: Secondary | ICD-10-CM

## 2012-02-28 NOTE — Progress Notes (Signed)
Subjective:    Patient ID: Victor Daniels, male    DOB: August 05, 1946, 66 y.o.   MRN: [id number]  Synopsis: Victor Daniels has COPD (GOLD stage III and vasomotor rhinitis) who was first seen by the [facility] in 09/2011 for the same.    HPI  12/06/11 ROV --Very pleasant male with GOLD stage III COPD and vasomotor rhinitis presents for follow up.  He states that he has been exercising more and is not limited by shortness of breath.  He rarely uses his combivent inhaler more than once a day, twice is rare.  He has been raking leaves, walking for exercise.  He notes that the ipratropium nasal spray has been very helpful for his allergic rhinitis.  He was taken to [facility] on 10/26/11 for syncope and was treated for bronchitis with avelox, but it sounds like he didn't have cough, shortness of breath or fever.  02/28/12 ROV --doing fairly well. Gets some short of breath with exertion. Pushing himself until lungs start burning.  He is walking 20 minutes at a time with his dog but is not back in the gym.  He does not have problems with cough, chest pain or swelling.  Otherwise he is doing well and still using his inhalers.  Combivent helps with shortness of breath on exertion.  Past Medical History  Diagnosis Date  . COPD (chronic obstructive pulmonary disease)   . CHF (congestive heart failure)     ischemic CM.  EF 25%  . Hyperlipidemia   . Hypertension   . Heart attack 11/16/09  . OSA (obstructive sleep apnea)     not on CPAP  . DVT (deep venous thrombosis)   . Alcoholic gastritis   . CVA (cerebral infarction)     residual short term memory loss  . CAD (coronary artery disease)      Review of Systems  CV: no swelling, chest pain or palpitations Gen: weight stable, no sweats, fevers, chills.    Objective:   Physical Exam  Filed Vitals:   02/28/12 1358  BP: 118/82  Pulse: 73  Temp: 98 F (36.7 C)  TempSrc: Oral  Height: 6\' 1"  (1.854 m)  Weight: 275 lb (124.739 kg)    SpO2: 97%    Gen: well appearing, no acute distress HEENT: NCAT, PERRL, EOMi, OP clear, Neck: supple without masses PULM: Diminished throughout, few insp crackles in bases CV: RRR, no mgr, no JVD Ext: warm, no clubbing, no cyanosis      Assessment & Plan:   COPD (chronic obstructive pulmonary disease) GOLD stage III disease.  This has been a stable interval for Victor Daniels.  I don't see a reason to change his medications at this point.  I explained to him at length today that I felt that he should try to exercise more with a goal of 35 minutes daily.  I recommended that he use his combivent before he exercises to see if it helps him walk further.     Updated Medication List Outpatient Encounter Prescriptions as of 02/28/2012  Medication Sig Dispense Refill  . albuterol (PROVENTIL HFA;VENTOLIN HFA) 108 (90 BASE) MCG/ACT inhaler Inhale 2 puffs into the lungs every 6 (six) hours as needed.        Marland Kitchen albuterol-ipratropium (COMBIVENT) 18-103 MCG/ACT inhaler Inhale 2 puffs into the lungs every 6 (six) hours as needed.  3 Inhaler  3  . carvedilol (COREG) 6.25 MG tablet Take 1 tablet (6.25 mg total) by mouth 2 (two) times daily with  a meal.  180 tablet  3  . clopidogrel (PLAVIX) 75 MG tablet Take 1 tablet (75 mg total) by mouth daily.  90 tablet  3  . Fluticasone-Salmeterol (ADVAIR DISKUS) 250-50 MCG/DOSE AEPB Inhale 1 puff into the lungs 2 (two) times daily.  60 each  3  . hydrochlorothiazide (HYDRODIURIL) 25 MG tablet Take 1 tablet (25 mg total) by mouth daily.  90 tablet  3  . ipratropium (ATROVENT) 0.03 % nasal spray Place 2 sprays into the nose 3 (three) times daily as needed for rhinitis. prn  90 mL  3  . isosorbide mononitrate (IMDUR) 60 MG 24 hr tablet Take 1 tablet (60 mg total) by mouth daily.  90 tablet  3  . lisinopril (PRINIVIL,ZESTRIL) 20 MG tablet Take 1 tablet (20 mg total) by mouth daily.  90 tablet  3  . simvastatin (ZOCOR) 20 MG tablet Take 1 tablet (20 mg total) by mouth at  bedtime.  90 tablet  3

## 2012-02-28 NOTE — Patient Instructions (Signed)
Use two puff of the combivent before you exercise. Continue using the advair. Try to exercise more with a goal of 35 minutes of cardiovascular exercise daily.  We will see you back in 3-4 months.

## 2012-02-28 NOTE — Assessment & Plan Note (Signed)
GOLD stage III disease.  This has been a stable interval for Mr. Victor Daniels.  I don't see a reason to change his medications at this point.  I explained to him at length today that I felt that he should try to exercise more with a goal of 35 minutes daily.  I recommended that he use his combivent before he exercises to see if it helps him walk further.

## 2012-03-26 ENCOUNTER — Ambulatory Visit (INDEPENDENT_AMBULATORY_CARE_PROVIDER_SITE_OTHER): Payer: Medicare Other | Admitting: Internal Medicine

## 2012-03-26 ENCOUNTER — Encounter: Payer: Self-pay | Admitting: Internal Medicine

## 2012-03-26 VITALS — BP 122/68 | HR 87 | Temp 98.4°F | Resp 18 | Wt 282.2 lb

## 2012-03-26 DIAGNOSIS — J069 Acute upper respiratory infection, unspecified: Secondary | ICD-10-CM | POA: Insufficient documentation

## 2012-03-26 DIAGNOSIS — J449 Chronic obstructive pulmonary disease, unspecified: Secondary | ICD-10-CM

## 2012-03-26 MED ORDER — LEVOFLOXACIN 500 MG PO TABS: 500.0000 mg | ORAL_TABLET | Freq: Every day | ORAL | Status: AC
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
## 2012-03-26 NOTE — Assessment & Plan Note (Signed)
Will treat with antibiotics given his history of rapid decompensation .

## 2012-03-26 NOTE — Patient Instructions (Signed)
You have a viral Syndrome .  The post nasal drip is causing your sore throat.  Lavage your sinuses twice daly with Simply Saline nasal spray.  You may use  Sudafed PE 10 to 30 every 8 hours to manage any pressure in your sinuses and ears.  Gargle with salt water often for the sore throat.  Use Delsym for cough,  If you need something stronger call and I will call in Cheratussin cough syrup (has codeine) for the cough.  Start the antibiotic

## 2012-04-08 ENCOUNTER — Ambulatory Visit (INDEPENDENT_AMBULATORY_CARE_PROVIDER_SITE_OTHER): Payer: Medicare Other | Admitting: Internal Medicine

## 2012-04-08 ENCOUNTER — Encounter: Payer: Self-pay | Admitting: Internal Medicine

## 2012-04-08 VITALS — BP 122/80 | HR 72 | Temp 98.4°F | Resp 16 | Wt 284.1 lb

## 2012-04-08 DIAGNOSIS — I872 Venous insufficiency (chronic) (peripheral): Secondary | ICD-10-CM

## 2012-04-08 DIAGNOSIS — J069 Acute upper respiratory infection, unspecified: Secondary | ICD-10-CM

## 2012-04-08 DIAGNOSIS — J449 Chronic obstructive pulmonary disease, unspecified: Secondary | ICD-10-CM

## 2012-04-08 DIAGNOSIS — I878 Other specified disorders of veins: Secondary | ICD-10-CM

## 2012-04-08 DIAGNOSIS — E669 Obesity, unspecified: Secondary | ICD-10-CM

## 2012-04-08 DIAGNOSIS — M25559 Pain in unspecified hip: Secondary | ICD-10-CM

## 2012-04-08 DIAGNOSIS — I509 Heart failure, unspecified: Secondary | ICD-10-CM

## 2012-04-08 DIAGNOSIS — M25552 Pain in left hip: Secondary | ICD-10-CM | POA: Insufficient documentation

## 2012-04-08 NOTE — Assessment & Plan Note (Signed)
He is requesting referral to orthopedics for management of what he believes is a bone spur on his right hip. Refer to Dr. Skipper Cliche of [facility].

## 2012-04-08 NOTE — Assessment & Plan Note (Signed)
Managed with compression stockings  

## 2012-04-08 NOTE — Assessment & Plan Note (Addendum)
Resolved with empiric antibiotics and decongestants.
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
## 2012-04-08 NOTE — Patient Instructions (Signed)
Consider the Low Glycemic Index Diet and 6 smaller meals daily :   7 AM Low carbohydrate Protein  Shakes (EAS Carb Control  Or Atkins ,  Available everywhere,   In  cases at [employer] )  2.5 carbs  (Add or substitute a toasted sandwhich thin w/ peanut butter)  10 AM: Protein bar by Atkins (snack size,  Chocolate lover's variety at  [employer])    Lunch: sandwich on pita bread or flatbread (Joseph's makes a low carb pita bread and a flat bread , available at [employer] and [employer]; Toufayah makes a low carb flatbread available at [employer] and [employer]) and [employer] makes a low carb whole wheat tortilla that is 6 net carbs   3 PM:  Mid day :  Another protein bar,  Or a  cheese stick, 1/4 cup of almonds, walnuts, pistachios, pecans, peanuts,  Macadamia nuts  6 PM  Dinner:  "mean and green:"  Meat/chicken/fish, salad, and green veggie : use ranch, vinagrette,  Blue cheese, etc  9 PM snack : Breyer's low carb fudgsicle or  ice cream bar (Carb Smart) Weight Watcher's ice cream bar , or another protein shake

## 2012-04-08 NOTE — Assessment & Plan Note (Signed)
Currently asymptomatic. He has been working out in the yard. I have reminded him that he should use a mask during episodes of high-volume pollens and alert.

## 2012-04-08 NOTE — Assessment & Plan Note (Signed)
He is currently asymptomatic and is able to do light yard work without decompensating.

## 2012-07-08 ENCOUNTER — Encounter: Payer: Self-pay | Admitting: Internal Medicine

## 2012-07-08 ENCOUNTER — Ambulatory Visit (INDEPENDENT_AMBULATORY_CARE_PROVIDER_SITE_OTHER): Payer: Medicare Other | Admitting: Internal Medicine

## 2012-07-08 VITALS — BP 112/80 | HR 83 | Temp 98.4°F | Ht 72.0 in | Wt 286.0 lb

## 2012-07-08 DIAGNOSIS — M25559 Pain in unspecified hip: Secondary | ICD-10-CM

## 2012-07-08 DIAGNOSIS — Z79899 Other long term (current) drug therapy: Secondary | ICD-10-CM

## 2012-07-08 DIAGNOSIS — I878 Other specified disorders of veins: Secondary | ICD-10-CM

## 2012-07-08 DIAGNOSIS — J449 Chronic obstructive pulmonary disease, unspecified: Secondary | ICD-10-CM

## 2012-07-08 DIAGNOSIS — I872 Venous insufficiency (chronic) (peripheral): Secondary | ICD-10-CM | POA: Insufficient documentation

## 2012-07-08 DIAGNOSIS — M25552 Pain in left hip: Secondary | ICD-10-CM

## 2012-07-08 DIAGNOSIS — E785 Hyperlipidemia, unspecified: Secondary | ICD-10-CM

## 2012-07-08 DIAGNOSIS — I428 Other cardiomyopathies: Secondary | ICD-10-CM

## 2012-07-08 DIAGNOSIS — I10 Essential (primary) hypertension: Secondary | ICD-10-CM

## 2012-07-08 DIAGNOSIS — R5383 Other fatigue: Secondary | ICD-10-CM

## 2012-07-08 DIAGNOSIS — I429 Cardiomyopathy, unspecified: Secondary | ICD-10-CM

## 2012-07-08 DIAGNOSIS — F1011 Alcohol abuse, in remission: Secondary | ICD-10-CM | POA: Insufficient documentation

## 2012-07-08 LAB — CBC WITH DIFFERENTIAL/PLATELET
Eosinophils Relative: 8.3 % — ABNORMAL HIGH (ref 0.0–5.0)
HCT: 37.5 % — ABNORMAL LOW (ref 39.0–52.0)
Hemoglobin: 12.5 g/dL — ABNORMAL LOW (ref 13.0–17.0)
Lymphocytes Relative: 27 % (ref 12.0–46.0)
Lymphs Abs: 1.8 10*3/uL (ref 0.7–4.0)
Monocytes Relative: 9.1 % (ref 3.0–12.0)
Neutro Abs: 3.7 10*3/uL (ref 1.4–7.7)
WBC: 6.8 10*3/uL (ref 4.5–10.5)

## 2012-07-08 LAB — LIPID PANEL
LDL Cholesterol: 88 mg/dL (ref 0–99)
Total CHOL/HDL Ratio: 3

## 2012-07-08 MED ORDER — TRAMADOL HCL 50 MG PO TABS: 50.0000 mg | ORAL_TABLET | Freq: Three times a day (TID) | ORAL | Status: DC | PRN

## 2012-07-08 MED ORDER — TRAMADOL HCL 50 MG PO TABS: 50.0000 mg | ORAL_TABLET | Freq: Three times a day (TID) | ORAL | Status: AC | PRN

## 2012-07-08 NOTE — Assessment & Plan Note (Signed)
Well controlled on current regimen, no changes today. 

## 2012-07-08 NOTE — Assessment & Plan Note (Signed)
He has had an evaluation by vascular surgery and is reluctant to go back to them since they have nothing to offer him. He is unable to tolerate compression stockings and is aware that this is the only treatment other than venous ablation

## 2012-07-08 NOTE — Assessment & Plan Note (Signed)
His current complaint shortness of breath appears to be due to the current environment and his chronic illness. He is not wheezing on exam his saturations are fine. Recommended a standing dose during this humid days and continue use his inhalers as directed.

## 2012-07-08 NOTE — Patient Instructions (Signed)
You can take   up up to 2000 mg of tylenol daily    I recommend alternating with tramadol 48m g ,, so you can cut back on your tylenol

## 2012-07-08 NOTE — Assessment & Plan Note (Signed)
He was treated for trochanteric bursitis by Dr. presents with a steroid injection with failure to improve. He does not want narcotics for management of pain. However he is taking excessive amounts of Tylenol at 3 g daily. I have prescribed tramadol for him to alternate with Tylenol to reduce his total Tylenol consumption to 2000 mg daily. CMET ordered today to check liver function tests
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
## 2012-07-09 LAB — COMPLETE METABOLIC PANEL WITH GFR
Albumin: 4.1 g/dL (ref 3.5–5.2)
CO2: 29 mEq/L (ref 19–32)
Calcium: 9.5 mg/dL (ref 8.4–10.5)
Chloride: 102 mEq/L (ref 96–112)
GFR, Est African American: 66 mL/min
GFR, Est Non African American: 57 mL/min — ABNORMAL LOW
Glucose, Bld: 93 mg/dL (ref 70–99)
Potassium: 4.4 mEq/L (ref 3.5–5.3)
Sodium: 140 mEq/L (ref 135–145)
Total Protein: 6.8 g/dL (ref 6.0–8.3)

## 2012-07-10 ENCOUNTER — Encounter: Payer: Self-pay | Admitting: Cardiovascular Disease

## 2012-07-10 ENCOUNTER — Encounter: Payer: Self-pay | Admitting: Internal Medicine

## 2012-07-10 ENCOUNTER — Ambulatory Visit (INDEPENDENT_AMBULATORY_CARE_PROVIDER_SITE_OTHER): Payer: Medicare Other | Admitting: Cardiovascular Disease

## 2012-07-10 VITALS — BP 110/80 | HR 80 | Ht 72.0 in | Wt 285.5 lb

## 2012-07-10 DIAGNOSIS — I2510 Atherosclerotic heart disease of native coronary artery without angina pectoris: Secondary | ICD-10-CM

## 2012-07-10 DIAGNOSIS — E785 Hyperlipidemia, unspecified: Secondary | ICD-10-CM

## 2012-07-10 DIAGNOSIS — I519 Heart disease, unspecified: Secondary | ICD-10-CM

## 2012-07-10 DIAGNOSIS — R42 Dizziness and giddiness: Secondary | ICD-10-CM

## 2012-07-10 DIAGNOSIS — E669 Obesity, unspecified: Secondary | ICD-10-CM

## 2012-07-10 MED ORDER — ISOSORBIDE MONONITRATE ER 30 MG PO TB24: 30.0000 mg | ORAL_TABLET | Freq: Every day | ORAL | Status: DC

## 2012-07-10 MED ORDER — SILODOSIN 8 MG PO CAPS: 8.0000 mg | ORAL_CAPSULE | Freq: Every day | ORAL | Status: DC

## 2012-07-10 MED ORDER — IPRATROPIUM-ALBUTEROL 18-103 MCG/ACT IN AERO: 2.0000 | INHALATION_SPRAY | Freq: Four times a day (QID) | RESPIRATORY_TRACT | Status: DC | PRN

## 2012-07-10 MED ORDER — HYDRALAZINE HCL 25 MG PO TABS: 25.0000 mg | ORAL_TABLET | Freq: Two times a day (BID) | ORAL | Status: DC

## 2012-07-10 MED ORDER — FLUTICASONE-SALMETEROL 250-50 MCG/DOSE IN AEPB: 1.0000 | INHALATION_SPRAY | Freq: Two times a day (BID) | RESPIRATORY_TRACT | Status: DC

## 2012-07-10 MED ORDER — IPRATROPIUM BROMIDE 0.03 % NA SOLN: 2.0000 | Freq: Three times a day (TID) | NASAL | Status: DC | PRN

## 2012-07-10 NOTE — Addendum Note (Signed)
Addended by: Duncan Dull on: 07/10/2012 06:01 AM   Modules accepted: Orders

## 2012-07-10 NOTE — Assessment & Plan Note (Signed)
Recent echocardiogram confirming normal systolic function in December 1610. 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 We have suggested he stay on his current medications and closely monitor his weight.

## 2012-07-10 NOTE — Assessment & Plan Note (Addendum)
I warned about orthostatic type symptoms given low blood pressures with standing in our office, systolic pressure in the low 90s. We have suggested he decrease his isosorbide to 30 mg daily. If symptoms persist, we could decrease the HCTZ in half, or stop this altogether

## 2012-07-10 NOTE — Assessment & Plan Note (Signed)
Cholesterol mildly above goal. We have suggested he closely watch his weight and diet. We could change him to Lipitor 20 or 40 mg in the future if this continues to be elevated.

## 2012-07-10 NOTE — Assessment & Plan Note (Signed)
Currently with no symptoms of angina. No further workup at this time. Continue current medication regimen. 

## 2012-07-10 NOTE — Patient Instructions (Addendum)
You are doing well Please decrease the isosorbide to 30 mg daily (for dizziness) Take plavix every other day  Please call us if you have new issues that need to be addressed before your next appt.  Your physician wants you to follow-up in: 6 months.  You will receive a reminder letter in the mail two months in advance. If you don't receive a letter, please call our office to schedule the follow-up appointment.

## 2012-07-10 NOTE — Assessment & Plan Note (Signed)
We have encouraged continued exercise, careful diet management in an effort to lose weight. 

## 2012-07-10 NOTE — Progress Notes (Signed)
Patient ID: Victor Daniels, male    DOB: December 17, 1946, 66 y.o.   MRN: [id number]  HPI Comments: 66 year old gentleman patient of Dr. Darrick Huntsman, with a history of coronary artery disease, non-Q wave MI in the setting of multiorgan failure in November 2010 in [place], at that time with congestive heart failure and LV dysfunction with ejection fraction 25%, COPD, single kidney with previous renal failure in. On dialysis, CVA x2 with short-term memory loss, history of heavy alcohol abuse and smoking who presents for routine followup. Notes indicate ejection fraction of 40% in 2007, 25% in 2010 at the time when he was drinking heavily, most recent ejection fraction was normal after he stopped drinking. Unable to obtain cardiac catheterization report.  He reports that he has been having episodes of dizziness, typically when he stands up or does anything. He has symptoms when he bends over and stands up. He has migraine headaches. Otherwise he reports feeling well. He denies worsening edema or shortness of breath. He has been more compliant with his medications. He reports not drinking or smoking. He does have significant bruising from Plavix.  EKG shows normal sinus rhythm with rate 81 beats per minute with incomplete right bundle branch block, no significant ST or T wave changes   Outpatient Encounter Prescriptions as of 07/10/2012  Medication Sig Dispense Refill  . albuterol-ipratropium (COMBIVENT) 18-103 MCG/ACT inhaler Inhale 2 puffs into the lungs every 6 (six) hours as needed.  3 Inhaler  3  . carvedilol (COREG) 6.25 MG tablet Take 1 tablet (6.25 mg total) by mouth 2 (two) times daily with a meal.  180 tablet  3  . clopidogrel (PLAVIX) 75 MG tablet Take 1 tablet (75 mg total) by mouth daily.  90 tablet  3  . Fluticasone-Salmeterol (ADVAIR DISKUS) 250-50 MCG/DOSE AEPB Inhale 1 puff into the lungs 2 (two) times daily.  180 each  3  . hydrochlorothiazide (HYDRODIURIL) 25 MG tablet Take 1 tablet (25  mg total) by mouth daily.  90 tablet  3  . ipratropium (ATROVENT) 0.03 % nasal spray Place 2 sprays into the nose 3 (three) times daily as needed for rhinitis. prn  90 mL  3  . lisinopril (PRINIVIL,ZESTRIL) 20 MG tablet Take 1 tablet (20 mg total) by mouth daily.  90 tablet  3  . simvastatin (ZOCOR) 20 MG tablet Take 1 tablet (20 mg total) by mouth at bedtime.  90 tablet  3  . traMADol (ULTRAM) 50 MG tablet Take 1 tablet (50 mg total) by mouth every 8 (eight) hours as needed for pain.  90 tablet  0  .  isosorbide mononitrate (IMDUR) 60 MG 24 hr tablet Take 1 tablet (60 mg total) by mouth daily.  90 tablet  3   Review of Systems  Constitutional: Negative.   HENT: Negative.   Eyes: Negative.   Respiratory: Positive for shortness of breath.   Cardiovascular: Positive for leg swelling.  Gastrointestinal: Negative.   Musculoskeletal: Negative.   Skin: Negative.   Neurological: Negative.   Hematological: Negative.   Psychiatric/Behavioral: Negative.   All other systems reviewed and are negative.    BP 110/80  Pulse 80  Ht 6' (1.829 m)  Wt 285 lb 8 oz (129.502 kg)  BMI 38.72 kg/m2  Physical Exam  Nursing note and vitals reviewed. Constitutional: He is oriented to person, place, and time. He appears well-developed and well-nourished.       obesity  HENT:  Head: Normocephalic.  Nose: Nose normal.  Mouth/Throat: Oropharynx is clear and moist.  Eyes: Conjunctivae are normal. Pupils are equal, round, and reactive to light.  Neck: Normal range of motion. Neck supple. No JVD present.  Cardiovascular: Normal rate, regular rhythm, S1 normal, S2 normal, normal heart sounds and intact distal pulses.  Exam reveals no gallop and no friction rub.   No murmur heard.      Discoloration of his lower extremities secondary to venous insufficiency, chronic  Pulmonary/Chest: Effort normal and breath sounds normal. No respiratory distress. He has no wheezes. He has no rales. He exhibits no tenderness.    Abdominal: Soft. Bowel sounds are normal. He exhibits no distension. There is no tenderness.  Musculoskeletal: Normal range of motion. He exhibits edema. He exhibits no tenderness.  Lymphadenopathy:    He has no cervical adenopathy.  Neurological: He is alert and oriented to person, place, and time. Coordination normal.  Skin: Skin is warm and dry. No rash noted. No erythema.  Psychiatric: He has a normal mood and affect. His behavior is normal. Judgment and thought content normal.           Assessment and Plan

## 2012-07-14 ENCOUNTER — Encounter: Payer: Self-pay | Admitting: Internal Medicine

## 2012-07-18 ENCOUNTER — Encounter: Payer: Self-pay | Admitting: Internal Medicine

## 2012-07-30 ENCOUNTER — Telehealth: Payer: Self-pay | Admitting: *Deleted

## 2012-07-30 MED ORDER — IPRATROPIUM-ALBUTEROL 20-100 MCG/ACT IN AERS: 1.0000 | INHALATION_SPRAY | Freq: Four times a day (QID) | RESPIRATORY_TRACT | Status: DC | PRN

## 2012-07-30 NOTE — Telephone Encounter (Signed)
Patient notified, he will pick up the Rx on Friday.

## 2012-07-30 NOTE — Telephone Encounter (Signed)
Received fax from pharmacy stating that the combivent has been discontinued by the [facility]. They are asking for a new rx for an alternative.

## 2012-07-30 NOTE — Telephone Encounter (Signed)
Yes, it has,  The replacement is called respimat.  We have a coupon at the office for free 30 days.  He can come by to get the coupon and rx.

## 2012-09-18 ENCOUNTER — Ambulatory Visit (INDEPENDENT_AMBULATORY_CARE_PROVIDER_SITE_OTHER): Payer: Medicare Other | Admitting: Internal Medicine

## 2012-09-18 ENCOUNTER — Encounter: Payer: Self-pay | Admitting: Internal Medicine

## 2012-09-18 VITALS — BP 124/76 | HR 80 | Temp 98.1°F | Resp 18 | Wt 282.0 lb

## 2012-09-18 DIAGNOSIS — J449 Chronic obstructive pulmonary disease, unspecified: Secondary | ICD-10-CM

## 2012-09-18 DIAGNOSIS — M25559 Pain in unspecified hip: Secondary | ICD-10-CM

## 2012-09-18 DIAGNOSIS — J069 Acute upper respiratory infection, unspecified: Secondary | ICD-10-CM

## 2012-09-18 DIAGNOSIS — M25552 Pain in left hip: Secondary | ICD-10-CM

## 2012-09-18 DIAGNOSIS — R42 Dizziness and giddiness: Secondary | ICD-10-CM

## 2012-09-18 DIAGNOSIS — M542 Cervicalgia: Secondary | ICD-10-CM

## 2012-09-18 MED ORDER — HYDROCODONE-ACETAMINOPHEN 7.5-300 MG PO TABS: 1.0000 | ORAL_TABLET | Freq: Four times a day (QID) | ORAL | Status: DC | PRN

## 2012-09-18 MED ORDER — CARVEDILOL 6.25 MG PO TABS: 6.2500 mg | ORAL_TABLET | Freq: Two times a day (BID) | ORAL | Status: DC

## 2012-09-18 MED ORDER — SIMVASTATIN 20 MG PO TABS: 20.0000 mg | ORAL_TABLET | Freq: Every day | ORAL | Status: DC

## 2012-09-18 MED ORDER — AMOXICILLIN-POT CLAVULANATE 875-125 MG PO TABS: 1.0000 | ORAL_TABLET | Freq: Two times a day (BID) | ORAL | Status: DC

## 2012-09-18 MED ORDER — ISOSORBIDE MONONITRATE ER 30 MG PO TB24: 30.0000 mg | ORAL_TABLET | Freq: Every day | ORAL | Status: DC

## 2012-09-18 MED ORDER — CLOPIDOGREL BISULFATE 75 MG PO TABS: 75.0000 mg | ORAL_TABLET | ORAL | Status: DC

## 2012-09-18 MED ORDER — LISINOPRIL 20 MG PO TABS: 20.0000 mg | ORAL_TABLET | Freq: Every day | ORAL | Status: DC

## 2012-09-18 MED ORDER — HYDROCHLOROTHIAZIDE 25 MG PO TABS: 25.0000 mg | ORAL_TABLET | Freq: Every day | ORAL | Status: DC

## 2012-09-18 NOTE — Patient Instructions (Addendum)
You have a viral  Syndrome .  The post nasal drip is causing your sore throat.  Lavage your sinuses twice daly with Simply saline nasal spray.  Use benadryl 25 mg every 8 hours and Sudafed PE 10 to 30 every 8 hours to manage the drainage and congestion.  Gargle with salt water often for the sore throat.  If the throat is no better  In 3 to 4 days OR  if you develop T > 100.4,  Green nasal discharge,  Or facial pain,  Start the amoxicillin    Suspend the Imdur .It drops yor blood pressure and may cause flushing.   Trial of hydrocodone/APAP (tylenol with narcotic added)  for pain control.  Continue daily laxative to prevent constipation.  Cut the pain pill in half if it is too strong   You can substitute Symbicort ( 2 puffs twice daily) for the Advair if you run out

## 2012-09-18 NOTE — Assessment & Plan Note (Signed)
Accompanied by neck pain and multiple joint aches. He had no relief of pain with tramadol. Trial of Vicodin. Reminded to take a laxative to prevent constipation.

## 2012-09-18 NOTE — Assessment & Plan Note (Signed)
Presumed secondary to head cold and her persistent hypotension. I have temporarily suspended Imdur .

## 2012-09-18 NOTE — Assessment & Plan Note (Signed)
He has no current radiculopathy symptoms. He does have a history of cervical spine stenosis and underwent decompression with fusion in 2006. Trial of Vicodin for pain control.
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
## 2012-09-18 NOTE — Assessment & Plan Note (Signed)
His exam and history is consistent with a viral URI at this time. He has no myalgias. A flu shot was requested. I have recommended symptomatic management of symptoms in addition of amoxicillin in 2-3 days if symptoms escalate to include fevers facial pain or purulent discharge. He is not wheezing and does not need any treatment for COPD exacerbation.

## 2012-09-18 NOTE — Assessment & Plan Note (Addendum)
Currently managed with Advair twice daily and Combivent 4 times daily.Peggye Form given him samples of Symbicort as he is in the donut hole currently.

## 2012-09-25 ENCOUNTER — Telehealth: Payer: Self-pay | Admitting: *Deleted

## 2012-09-25 MED ORDER — CLOPIDOGREL BISULFATE 75 MG PO TABS: 75.0000 mg | ORAL_TABLET | Freq: Every day | ORAL | Status: DC

## 2012-09-25 NOTE — Telephone Encounter (Signed)
No,  Daily,  rx on printer

## 2012-09-25 NOTE — Telephone Encounter (Signed)
Pharmacist called to verify Plavix Rx, the Rx they have says once every other day.  Is this correct?  Please advise.

## 2012-09-25 NOTE — Telephone Encounter (Signed)
Called and left message on pharmacy voicemail advising as instructed. 

## 2012-10-15 ENCOUNTER — Ambulatory Visit: Payer: Medicare Other | Admitting: Internal Medicine

## 2012-10-23 DIAGNOSIS — R361 Hematospermia: Secondary | ICD-10-CM | POA: Insufficient documentation

## 2012-10-23 DIAGNOSIS — M5430 Sciatica, unspecified side: Secondary | ICD-10-CM | POA: Insufficient documentation

## 2012-10-23 DIAGNOSIS — N401 Enlarged prostate with lower urinary tract symptoms: Secondary | ICD-10-CM | POA: Insufficient documentation

## 2012-10-23 DIAGNOSIS — N411 Chronic prostatitis: Secondary | ICD-10-CM | POA: Insufficient documentation

## 2012-10-23 HISTORY — DX: Hematospermia: R36.1

## 2012-11-27 ENCOUNTER — Ambulatory Visit (INDEPENDENT_AMBULATORY_CARE_PROVIDER_SITE_OTHER): Payer: Medicare Other | Admitting: Internal Medicine

## 2012-11-27 ENCOUNTER — Encounter: Payer: Self-pay | Admitting: Internal Medicine

## 2012-11-27 VITALS — BP 140/82 | HR 84 | Temp 98.0°F | Resp 12 | Ht 73.0 in | Wt 292.5 lb

## 2012-11-27 DIAGNOSIS — Z1331 Encounter for screening for depression: Secondary | ICD-10-CM

## 2012-11-27 DIAGNOSIS — J449 Chronic obstructive pulmonary disease, unspecified: Secondary | ICD-10-CM

## 2012-11-27 DIAGNOSIS — I509 Heart failure, unspecified: Secondary | ICD-10-CM

## 2012-11-27 DIAGNOSIS — R42 Dizziness and giddiness: Secondary | ICD-10-CM

## 2012-11-27 DIAGNOSIS — I2510 Atherosclerotic heart disease of native coronary artery without angina pectoris: Secondary | ICD-10-CM

## 2012-11-27 DIAGNOSIS — E669 Obesity, unspecified: Secondary | ICD-10-CM

## 2012-11-27 MED ORDER — TRAMADOL HCL 50 MG PO TABS: 50.0000 mg | ORAL_TABLET | Freq: Four times a day (QID) | ORAL | Status: DC | PRN
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
## 2012-11-27 NOTE — Patient Instructions (Addendum)
I am substituting Symbicort   2 puffs twice daily  (takes the place of Advair)   You have spiriva samples  For a month  (takes the place of combivent 4 times daily, so save it for when you run out of it)   Try adding tramadol for pain.  50 mg you can take it up to four times daily for pain relief.  It can be added to alleve.   Try cutting the carvedilol in half and using it twice daily to help your heart,  Eat less peanut butter!! You have gained 10 lbs

## 2012-11-29 ENCOUNTER — Other Ambulatory Visit: Payer: Self-pay

## 2012-11-29 ENCOUNTER — Encounter: Payer: Self-pay | Admitting: Internal Medicine

## 2012-11-29 MED ORDER — CARVEDILOL 3.125 MG PO TABS: 6.2500 mg | ORAL_TABLET | Freq: Two times a day (BID) | ORAL | Status: DC

## 2012-11-29 NOTE — Assessment & Plan Note (Signed)
Multifactorial by history ,  Due to trial of tramadol and multiple bp lowering medication started bc of his cardiomyopathy.  Suggested that he resume the carvedilol at 1/2 dose twice daily .

## 2012-11-29 NOTE — Assessment & Plan Note (Signed)
Advised to omit peanut butter as he has been overindulging.  Low gi diet advised.

## 2012-11-29 NOTE — Assessment & Plan Note (Signed)
Weight gain noted,  No significant edema on exam.  Resume carvedilol twice daily at lower dose if tolerated.  Advised to buy scale and weigh daily .  Low sodium diet reminder.

## 2012-11-29 NOTE — Assessment & Plan Note (Signed)
Samples of Symbicort and spiriva given as patient is in donut hole.

## 2012-11-29 NOTE — Assessment & Plan Note (Signed)
Asymptomatic since stopping Imdur,  Resume carvedilol at twice daily lower dose. If tolerated without recurrent of dizziness.

## 2012-12-03 ENCOUNTER — Ambulatory Visit (INDEPENDENT_AMBULATORY_CARE_PROVIDER_SITE_OTHER): Payer: Medicare Other | Admitting: Pulmonary Disease

## 2012-12-03 ENCOUNTER — Encounter: Payer: Self-pay | Admitting: Pulmonary Disease

## 2012-12-03 VITALS — BP 132/88 | HR 83 | Temp 98.4°F | Ht 72.0 in | Wt 293.0 lb

## 2012-12-03 DIAGNOSIS — Z23 Encounter for immunization: Secondary | ICD-10-CM

## 2012-12-03 DIAGNOSIS — J449 Chronic obstructive pulmonary disease, unspecified: Secondary | ICD-10-CM

## 2012-12-03 NOTE — Patient Instructions (Signed)
Take the [facility] sample after you use up the symbicort. Use the albuterol (ProAir) inhaler as a rescue inhaler In 2014 I would like to start you back on Advair and combivent. There is no need to continue the Spiriva after the first of the year.  We will see you back in 4-6 months or sooner if needed.

## 2012-12-03 NOTE — Progress Notes (Signed)
Subjective:    Patient ID: Victor Daniels, male    DOB: 04-11-1946, 66 y.o.   MRN: [id number]  Synopsis: Victor Daniels has COPD (GOLD stage III and vasomotor rhinitis) who was first seen by the [facility] in 09/2011 for the same.    HPI  12/06/11 ROV --Very pleasant male with GOLD stage III COPD and vasomotor rhinitis presents for follow up.  He states that he has been exercising more and is not limited by shortness of breath.  He rarely uses his combivent inhaler more than once a day, twice is rare.  He has been raking leaves, walking for exercise.  He notes that the ipratropium nasal spray has been very helpful for his allergic rhinitis.  He was taken to [facility] on 10/26/11 for syncope and was treated for bronchitis with avelox, but it sounds like he didn't have cough, shortness of breath or fever.  02/28/12 ROV --doing fairly well. Gets some short of breath with exertion. Pushing himself until lungs start burning.  He is walking 20 minutes at a time with his dog but is not back in the gym.  He does not have problems with cough, chest pain or swelling.  Otherwise he is doing well and still using his inhalers.  Combivent helps with shortness of breath on exertion.  12/03/2012 ROV -- Victor Daniels is doing well.  He has been active every day in his yard getting up leaves.  He is not having trouble with shortness of breath or cough.  Sometimes he has to slow down if he pushes himself to hard.  Rest and a rescue inhaler help with this.  He has fallen into the donut hole so he has recently switched to Symbicort and Spiriva since he is relying on samples from [place] until Jan 2014.  Past Medical History  Diagnosis Date  . COPD (chronic obstructive pulmonary disease)   . CHF (congestive heart failure)     ischemic CM.  EF 25%  . Hyperlipidemia   . Hypertension   . Heart attack 11/16/09  . OSA (obstructive sleep apnea)     not on CPAP  . DVT (deep venous thrombosis)   . Alcoholic gastritis   .  CVA (cerebral infarction)     residual short term memory loss  . CAD (coronary artery disease)   . Venous insufficiency of leg   . History of alcohol abuse 2005    now abstinent for years     Review of Systems not obtained    Objective:   Physical Exam  Filed Vitals:   12/03/12 1143  BP: 132/88  Pulse: 83  Temp: 98.4 F (36.9 C)  TempSrc: Oral  Height: 6' (1.829 m)  Weight: 293 lb (132.904 kg)  SpO2: 98%    Gen: well appearing, no acute distress HEENT: NCAT, PERRL, EOMi, OP clear, Neck: supple without masses PULM: Diminished throughout, few insp crackles in bases CV: RRR, no mgr, no JVD Ext: warm, no clubbing, no cyanosis      Assessment & Plan:   COPD (chronic obstructive pulmonary disease) This has been a stable interval for Victor Daniels.  He is surviving on drug samples right now as he fell in the donut hole financially.    He really should go back to Advair and combivent after Jan 1 , 2014 as he has done well with this.  He gets benefit from short acting bronchodilators and seems to have some degree of an allergic component so I'd prefer to keep him  on an inhaled corticosteroid.  Plan: -continue the combivent or dulera samples sequentially until Dec 25, 2012 -stop spiriva after he completes the sample -substitute albuterol for combivent temporarily while on spiriva -restart Advair/Combivent after Dec 25, 2012 -flu shot today -f/u with [place] in 4-6 months    Updated Medication List Outpatient Encounter Prescriptions as of 12/03/2012  Medication Sig Dispense Refill  . carvedilol (COREG) 3.125 MG tablet Take 3.125 mg by mouth 2 (two) times daily with a meal.      . clopidogrel (PLAVIX) 75 MG tablet Take 75 mg by mouth every other day.      . Fluticasone-Salmeterol (ADVAIR) 250-50 MCG/DOSE AEPB Inhale 1 puff into the lungs daily.      Marland Kitchen ipratropium (ATROVENT) 0.03 % nasal spray Place 2 sprays into the nose 3 (three) times daily as needed for rhinitis. prn  90 mL  3  .  Ipratropium-Albuterol (COMBIVENT) 20-100 MCG/ACT AERS respimat 2 puffs before exercise      . lisinopril (PRINIVIL,ZESTRIL) 20 MG tablet Take 1 tablet (20 mg total) by mouth daily.  90 tablet  3  . simvastatin (ZOCOR) 20 MG tablet Take 1 tablet (20 mg total) by mouth at bedtime.  90 tablet  3  . tiotropium (SPIRIVA) 18 MCG inhalation capsule Place 18 mcg into inhaler and inhale daily.      . [DISCONTINUED] carvedilol (COREG) 3.125 MG tablet Take 2 tablets (6.25 mg total) by mouth 2 (two) times daily with a meal.  180 tablet  3  . [DISCONTINUED] clopidogrel (PLAVIX) 75 MG tablet Take 1 tablet (75 mg total) by mouth daily.  90 tablet  3  . [DISCONTINUED] Fluticasone-Salmeterol (ADVAIR DISKUS) 250-50 MCG/DOSE AEPB Inhale 1 puff into the lungs 2 (two) times daily.  180 each  3  . [DISCONTINUED] Ipratropium-Albuterol (COMBIVENT RESPIMAT) 20-100 MCG/ACT AERS respimat Inhale 1 puff into the lungs every 6 (six) hours as needed for wheezing.  4 g  11  . [DISCONTINUED] hydrochlorothiazide (HYDRODIURIL) 25 MG tablet Take 1 tablet (25 mg total) by mouth daily.  90 tablet  3  . [DISCONTINUED] Hydrocodone-Acetaminophen 7.5-300 MG TABS Take 1 tablet by mouth every 6 (six) hours as needed.  120 each  1  . [DISCONTINUED] traMADol (ULTRAM) 50 MG tablet Take 1 tablet (50 mg total) by mouth every 6 (six) hours as needed for pain.  90 tablet  3

## 2012-12-03 NOTE — Assessment & Plan Note (Signed)
This has been a stable interval for Trayon.  He is surviving on drug samples right now as he fell in the donut hole financially.    He really should go back to Advair and combivent after Jan 1 , 2014 as he has done well with this.  He gets benefit from short acting bronchodilators and seems to have some degree of an allergic component so I'd prefer to keep him on an inhaled corticosteroid.  Plan: -continue the combivent or dulera samples sequentially until Dec 25, 2012 -stop spiriva after he completes the sample -substitute albuterol for combivent temporarily while on spiriva -restart Advair/Combivent after Dec 25, 2012 -flu shot today -f/u with [place] in 4-6 months

## 2012-12-31 ENCOUNTER — Ambulatory Visit: Payer: Medicare Other | Admitting: Internal Medicine

## 2013-01-07 ENCOUNTER — Encounter: Payer: Self-pay | Admitting: Cardiovascular Disease

## 2013-01-07 ENCOUNTER — Ambulatory Visit (INDEPENDENT_AMBULATORY_CARE_PROVIDER_SITE_OTHER): Payer: Medicare Other | Admitting: Cardiovascular Disease

## 2013-01-07 VITALS — BP 132/84 | HR 80 | Ht 72.0 in | Wt 292.8 lb

## 2013-01-07 DIAGNOSIS — E785 Hyperlipidemia, unspecified: Secondary | ICD-10-CM

## 2013-01-07 DIAGNOSIS — I872 Venous insufficiency (chronic) (peripheral): Secondary | ICD-10-CM

## 2013-01-07 DIAGNOSIS — I509 Heart failure, unspecified: Secondary | ICD-10-CM

## 2013-01-07 DIAGNOSIS — I10 Essential (primary) hypertension: Secondary | ICD-10-CM

## 2013-01-07 DIAGNOSIS — R0602 Shortness of breath: Secondary | ICD-10-CM

## 2013-01-07 DIAGNOSIS — F1011 Alcohol abuse, in remission: Secondary | ICD-10-CM

## 2013-01-07 DIAGNOSIS — I878 Other specified disorders of veins: Secondary | ICD-10-CM

## 2013-01-07 DIAGNOSIS — I2510 Atherosclerotic heart disease of native coronary artery without angina pectoris: Secondary | ICD-10-CM

## 2013-01-07 NOTE — Assessment & Plan Note (Signed)
Blood pressure is well controlled on today's visit. No changes made to the medications. 

## 2013-01-07 NOTE — Assessment & Plan Note (Signed)
He reports that he has approximately 2 beers per month when he goes out socially and has a meal with his buddies

## 2013-01-07 NOTE — Progress Notes (Signed)
Patient ID: Victor Daniels, male    DOB: [birth year], 67 y.o.   MRN: [id number]  HPI Comments: 67 year old gentleman patient of Dr. Darrick Huntsman, with a history of coronary artery disease, non-Q wave MI in the setting of multiorgan failure in November 2010 in [place], at that time with congestive heart failure and LV dysfunction with ejection fraction 25%, COPD, single kidney with previous renal failure in. On dialysis, CVA x2 with short-term memory loss, history of heavy alcohol abuse and smoking who presents for routine followup. Notes indicate ejection fraction of 40% in 2007, 25% in 2010 at the time when he was drinking heavily, most recent ejection fraction was normal after he stopped drinking. Unable to obtain cardiac catheterization report. He stopped smoking 3 years ago, smoking history for 30 years  Overall he reports that he's been doing well. He continues to take Plavix every other day. He does report having some tingling in neuropathy in his legs which bother him at nighttime. No significant shortness of breath or fluid retention. He is not taking Lasix, only HCTZ. He takes his inhalers periodically, not on regular basis. He would like to lose more weight. No chest pain or symptoms concerning for angina with exertion  EKG shows normal sinus rhythm with rate 80 beats per minute, no significant ST or T wave changes   Outpatient Encounter Prescriptions as of 01/07/2013  Medication Sig Dispense Refill  . albuterol (PROVENTIL) (2.5 MG/3ML) 0.083% nebulizer solution Take 2.5 mg by nebulization every 6 (six) hours as needed.      . budesonide-formoterol (SYMBICORT) 80-4.5 MCG/ACT inhaler Inhale 2 puffs into the lungs 2 (two) times daily.      . carvedilol (COREG) 3.125 MG tablet Take 3.125 mg by mouth 2 (two) times daily with a meal.      . clopidogrel (PLAVIX) 75 MG tablet Take 75 mg by mouth every other day.      . hydrochlorothiazide (HYDRODIURIL) 25 MG tablet Take 25 mg by mouth daily.       Marland Kitchen ipratropium (ATROVENT) 0.03 % nasal spray Place 2 sprays into the nose 3 (three) times daily as needed for rhinitis. prn  90 mL  3  . lisinopril (PRINIVIL,ZESTRIL) 20 MG tablet Take 1 tablet (20 mg total) by mouth daily.  90 tablet  3  . simvastatin (ZOCOR) 20 MG tablet Take 1 tablet (20 mg total) by mouth at bedtime.  90 tablet  3  . tiotropium (SPIRIVA) 18 MCG inhalation capsule Place 18 mcg into inhaler and inhale daily.      . [DISCONTINUED] Fluticasone-Salmeterol (ADVAIR) 250-50 MCG/DOSE AEPB Inhale 1 puff into the lungs daily.      . [DISCONTINUED] Ipratropium-Albuterol (COMBIVENT) 20-100 MCG/ACT AERS respimat 2 puffs before exercise        Review of Systems  Constitutional: Negative.   HENT: Negative.   Eyes: Negative.   Gastrointestinal: Negative.   Musculoskeletal: Negative.   Skin: Negative.   Neurological: Negative.   Hematological: Negative.   Psychiatric/Behavioral: Negative.   All other systems reviewed and are negative.    BP 132/84  Pulse 80  Ht 6' (1.829 m)  Wt 292 lb 12 oz (132.791 kg)  BMI 39.70 kg/m2  Physical Exam  Nursing note and vitals reviewed. Constitutional: He is oriented to person, place, and time. He appears well-developed and well-nourished.       obesity  HENT:  Head: Normocephalic.  Nose: Nose normal.  Mouth/Throat: Oropharynx is clear and moist.  Eyes: Conjunctivae normal  are normal. Pupils are equal, round, and reactive to light.  Neck: Normal range of motion. Neck supple. No JVD present.  Cardiovascular: Normal rate, regular rhythm, S1 normal, S2 normal, normal heart sounds and intact distal pulses.  Exam reveals no gallop and no friction rub.   No murmur heard.      Discoloration of his lower extremities secondary to venous insufficiency, chronic  Pulmonary/Chest: Effort normal and breath sounds normal. No respiratory distress. He has no wheezes. He has no rales. He exhibits no tenderness.  Abdominal: Soft. Bowel sounds are normal.  He exhibits no distension. There is no tenderness.  Musculoskeletal: Normal range of motion. He exhibits edema. He exhibits no tenderness.  Lymphadenopathy:    He has no cervical adenopathy.  Neurological: He is alert and oriented to person, place, and time. Coordination normal.  Skin: Skin is warm and dry. No rash noted. No erythema.  Psychiatric: He has a normal mood and affect. His behavior is normal. Judgment and thought content normal.           Assessment and Plan

## 2013-01-07 NOTE — Patient Instructions (Addendum)
You are doing well. No medication changes were made.  Please call us if you have new issues that need to be addressed before your next appt.  Your physician wants you to follow-up in: 12 months.  You will receive a reminder letter in the mail two months in advance. If you don't receive a letter, please call our office to schedule the follow-up appointment. 

## 2013-01-07 NOTE — Assessment & Plan Note (Signed)
Recommended that he stay on his simvastatin 

## 2013-01-07 NOTE — Assessment & Plan Note (Signed)
Currently with no symptoms of angina. No further workup at this time. Continue current medication regimen. 

## 2013-01-07 NOTE — Assessment & Plan Note (Signed)
Stable venous insufficiency. No significant edema on today's visit.

## 2013-01-07 NOTE — Assessment & Plan Note (Signed)
Clinical he appears to be euvolemic. No significant clinical signs of heart failure. No changes to his medications.

## 2013-01-14 ENCOUNTER — Telehealth: Payer: Self-pay | Admitting: Pulmonary Disease

## 2013-01-14 MED ORDER — ALBUTEROL SULFATE HFA 108 (90 BASE) MCG/ACT IN AERS: 2.0000 | INHALATION_SPRAY | Freq: Four times a day (QID) | RESPIRATORY_TRACT | Status: DC | PRN

## 2013-01-14 NOTE — Telephone Encounter (Signed)
Per Keane Scrape pt that she will send a 90 day ProAir Rx to [facility].  Pt verbalized understanding & stated nothing further needed at this time.    Antionette Fairy

## 2013-01-14 NOTE — Telephone Encounter (Signed)
Message copied from MyChart:   Appointment Request From: [name]  With Provider: Max Fickle, MD [facility] PULMONARY [facility] ] Preferred Date Range: From 01/14/2013 To 01/20/2013  Preferred Times: Mon Morning Reason for visit: Annual Physical Comments:   I am in need of a prescription for an emergency inhaler. Dr Kendrick Fries had given me a sample of Proair'HFR that seems to have worked ok. Let me know what he decides so I can check pricing at [facility] vs [facility].

## 2013-01-14 NOTE — Telephone Encounter (Signed)
RX sent to pharmacy and pt is aware 

## 2013-01-14 NOTE — Telephone Encounter (Signed)
I spoke with pt and he stated he is going to check prices and then call us back for rx to be sent. He is not sure when he will get around to this so will sign off message

## 2013-01-19 ENCOUNTER — Encounter: Payer: Self-pay | Admitting: Pulmonary Disease

## 2013-01-20 MED ORDER — ALBUTEROL SULFATE HFA 108 (90 BASE) MCG/ACT IN AERS: 2.0000 | INHALATION_SPRAY | Freq: Four times a day (QID) | RESPIRATORY_TRACT | Status: DC | PRN

## 2013-03-05 ENCOUNTER — Encounter: Payer: Self-pay | Admitting: Internal Medicine

## 2013-03-05 ENCOUNTER — Ambulatory Visit (INDEPENDENT_AMBULATORY_CARE_PROVIDER_SITE_OTHER): Payer: Medicare Other | Admitting: Internal Medicine

## 2013-03-05 VITALS — BP 140/80 | HR 95 | Temp 98.2°F | Resp 17 | Wt 289.5 lb

## 2013-03-05 DIAGNOSIS — E669 Obesity, unspecified: Secondary | ICD-10-CM

## 2013-03-05 DIAGNOSIS — R42 Dizziness and giddiness: Secondary | ICD-10-CM

## 2013-03-05 DIAGNOSIS — J441 Chronic obstructive pulmonary disease with (acute) exacerbation: Secondary | ICD-10-CM | POA: Insufficient documentation

## 2013-03-05 HISTORY — DX: Chronic obstructive pulmonary disease with (acute) exacerbation: J44.1

## 2013-03-05 LAB — COMPREHENSIVE METABOLIC PANEL
ALT: 25 U/L (ref 0–53)
AST: 20 U/L (ref 0–37)
Albumin: 3 g/dL — ABNORMAL LOW (ref 3.5–5.2)
CO2: 30 mEq/L (ref 19–32)
Calcium: 8.9 mg/dL (ref 8.4–10.5)
Chloride: 99 mEq/L (ref 96–112)
GFR: 59.16 mL/min — ABNORMAL LOW (ref >60.00)
Potassium: 4.3 mEq/L (ref 3.5–5.1)
Total Protein: 7.5 g/dL (ref 6.0–8.3)

## 2013-03-05 LAB — CBC WITH DIFFERENTIAL/PLATELET
Basophils Absolute: 0.1 10*3/uL (ref 0.0–0.1)
Eosinophils Absolute: 0.7 10*3/uL (ref 0.0–0.7)
Lymphocytes Relative: 14.1 % (ref 12.0–46.0)
MCHC: 33.3 g/dL (ref 30.0–36.0)
Monocytes Relative: 9 % (ref 3.0–12.0)
Neutro Abs: 7.6 10*3/uL (ref 1.4–7.7)
Neutrophils Relative %: 69.8 % (ref 43.0–77.0)
Platelets: 325 10*3/uL (ref 150.0–400.0)
RDW: 13.7 % (ref 11.5–14.6)

## 2013-03-05 LAB — LIPID PANEL
Cholesterol: 115 mg/dL (ref 0–200)
HDL: 28.4 mg/dL — ABNORMAL LOW (ref >39.00)
LDL Cholesterol: 72 mg/dL (ref 0–99)
Total CHOL/HDL Ratio: 4
Triglycerides: 74 mg/dL (ref 0.0–149.0)
VLDL: 14.8 mg/dL (ref 0.0–40.0)

## 2013-03-05 MED ORDER — PREDNISONE (PAK) 10 MG PO TABS: ORAL_TABLET | ORAL | Status: DC

## 2013-03-05 MED ORDER — LEVOFLOXACIN 500 MG PO TABS: 500.0000 mg | ORAL_TABLET | Freq: Every day | ORAL | Status: DC

## 2013-03-05 MED ORDER — ALBUTEROL SULFATE (2.5 MG/3ML) 0.083% IN NEBU
2.5000 mg | INHALATION_SOLUTION | Freq: Once | RESPIRATORY_TRACT | Status: AC
  Administered 2013-03-05: 2.5 mg via RESPIRATORY_TRACT

## 2013-03-05 MED ORDER — METHYLPREDNISOLONE ACETATE 40 MG/ML IJ SUSP
40.0000 mg | Freq: Once | INTRAMUSCULAR | Status: AC
  Administered 2013-03-05: 40 mg via INTRAMUSCULAR
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
## 2013-03-05 NOTE — Patient Instructions (Addendum)
You have a viral infection complicated by bacterial infection    .  I am prescribing an antibiotic (levaquin  and  prednisone taper \ to manage the inflammation in your sinuses and lungs.   I also advise use of the following OTC meds to help with your other symptoms.   Take generic OTC benadryl 25 mg every 8 hours for the drainage,  Sudafed PE  10 to 30 mg every 8 hours for the congestion, you may substitute Afrin nasal spray for the nighttime dose of sudafed PE  If needed to prevent insomnia.  flushes your sinuses twice daily with Simply Saline (do over the sink because if you do it right you will spit out globs of mucus)  Use OTC  Delsym for daily cough  Gargle with salt water as needed for sore throat.

## 2013-03-06 ENCOUNTER — Encounter: Payer: Self-pay | Admitting: Internal Medicine

## 2013-03-06 NOTE — Assessment & Plan Note (Signed)
Steroids, empiric abx, and bronchodilator therapy.  Given IM Depo Medrol and albuterol neb in office. Return in one week.

## 2013-03-12 ENCOUNTER — Ambulatory Visit (INDEPENDENT_AMBULATORY_CARE_PROVIDER_SITE_OTHER): Payer: Medicare Other | Admitting: Internal Medicine

## 2013-03-12 ENCOUNTER — Encounter: Payer: Self-pay | Admitting: Internal Medicine

## 2013-03-12 VITALS — BP 126/80 | HR 101 | Temp 98.4°F | Resp 16 | Wt 292.0 lb

## 2013-03-12 NOTE — Assessment & Plan Note (Signed)
Well controlled on current regimen. Renal function stable, no changes today. 

## 2013-03-12 NOTE — Assessment & Plan Note (Signed)
improved with steroids and empiric abx, lung exam is clear today.  Continue maintenance inhalers.
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
## 2013-03-20 ENCOUNTER — Encounter: Payer: Self-pay | Admitting: Internal Medicine

## 2013-03-20 MED ORDER — PREDNISONE (PAK) 10 MG PO TABS: ORAL_TABLET | ORAL | Status: DC

## 2013-03-20 MED ORDER — DOXYCYCLINE HYCLATE 100 MG PO TABS: 100.0000 mg | ORAL_TABLET | Freq: Two times a day (BID) | ORAL | Status: DC

## 2013-04-08 ENCOUNTER — Ambulatory Visit (INDEPENDENT_AMBULATORY_CARE_PROVIDER_SITE_OTHER): Payer: Medicare Other | Admitting: Internal Medicine

## 2013-04-08 ENCOUNTER — Encounter: Payer: Self-pay | Admitting: Internal Medicine

## 2013-04-08 VITALS — BP 136/72 | HR 78 | Temp 98.1°F | Resp 18 | Wt 291.5 lb

## 2013-04-08 DIAGNOSIS — R739 Hyperglycemia, unspecified: Secondary | ICD-10-CM

## 2013-04-08 DIAGNOSIS — R41841 Cognitive communication deficit: Secondary | ICD-10-CM

## 2013-04-08 DIAGNOSIS — D649 Anemia, unspecified: Secondary | ICD-10-CM

## 2013-04-08 DIAGNOSIS — R232 Flushing: Secondary | ICD-10-CM

## 2013-04-08 DIAGNOSIS — R7309 Other abnormal glucose: Secondary | ICD-10-CM

## 2013-04-08 DIAGNOSIS — E538 Deficiency of other specified B group vitamins: Secondary | ICD-10-CM

## 2013-04-08 DIAGNOSIS — G459 Transient cerebral ischemic attack, unspecified: Secondary | ICD-10-CM | POA: Insufficient documentation

## 2013-04-08 DIAGNOSIS — R079 Chest pain, unspecified: Secondary | ICD-10-CM

## 2013-04-08 DIAGNOSIS — R42 Dizziness and giddiness: Secondary | ICD-10-CM

## 2013-04-08 LAB — CBC WITH DIFFERENTIAL/PLATELET
Eosinophils Relative: 3.1 % (ref 0.0–5.0)
HCT: 34 % — ABNORMAL LOW (ref 39.0–52.0)
Hemoglobin: 11.3 g/dL — ABNORMAL LOW (ref 13.0–17.0)
Lymphs Abs: 1.4 10*3/uL (ref 0.7–4.0)
MCV: 86.4 fl (ref 78.0–100.0)
Monocytes Absolute: 0.5 10*3/uL (ref 0.1–1.0)
Monocytes Relative: 9 % (ref 3.0–12.0)
Neutro Abs: 4 10*3/uL (ref 1.4–7.7)
WBC: 6.1 10*3/uL (ref 4.5–10.5)

## 2013-04-08 LAB — TSH: TSH: 0.33 u[IU]/mL — ABNORMAL LOW (ref 0.35–5.50)

## 2013-04-08 MED ORDER — NITROGLYCERIN 0.4 MG SL SUBL: 0.4000 mg | SUBLINGUAL_TABLET | SUBLINGUAL | Status: DC | PRN

## 2013-04-08 NOTE — Assessment & Plan Note (Signed)
Exertional.  He has a history of CAD but no recent evaluation. New sl nitroglycerin rxd and refer to Dr. Mariah Milling for stress test.

## 2013-04-08 NOTE — Assessment & Plan Note (Signed)
Suspected by history of recurrent episodes of loss of conversational thread, and episodes of dizziness.  Carotid ultrasounds ordered and cardiology evaluation .

## 2013-04-08 NOTE — Patient Instructions (Addendum)
I agree with you that your symptoms are concerning   We are going to evaluate your heart and carotid arteries to look for blockages , and we will run some tests on your urine that look for hormones that can cause the flushing symptoms and screenyou for for diabetes   You will have to stop you carvedilol before we do the urine test but we will wait until the heart and carotid arteries have been fully evaluated. (Dr Mariah Milling)   I will send you a new nitroglycerin tablet to pharmacy,  Please take one the next time you have chest pressure.

## 2013-04-08 NOTE — Assessment & Plan Note (Signed)
Etiology unclear given absence of postprandial diarrhea and elevations in blood pressure., so carcinoid tumor and pheochromocytoma less likely but will need to be ruled out if serologic and cardiologic evaluations are normal.  He will need his bet blocker discontinued prior to 25 urine collection.
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
## 2013-04-09 LAB — TESTOSTERONE, FREE, TOTAL, SHBG
Sex Hormone Binding: 37 nmol/L (ref 13–71)
Testosterone: 254 ng/dL — ABNORMAL LOW (ref 300–890)

## 2013-04-09 NOTE — Addendum Note (Signed)
Addended by: [name] on: 04/09/2013 01:22 PM   Modules accepted: Orders

## 2013-04-13 ENCOUNTER — Encounter: Payer: Self-pay | Admitting: Internal Medicine

## 2013-04-14 ENCOUNTER — Ambulatory Visit (INDEPENDENT_AMBULATORY_CARE_PROVIDER_SITE_OTHER): Payer: Medicare Other | Admitting: Cardiovascular Disease

## 2013-04-14 ENCOUNTER — Encounter: Payer: Self-pay | Admitting: Cardiovascular Disease

## 2013-04-14 VITALS — BP 158/82 | HR 76 | Ht 72.0 in | Wt 284.5 lb

## 2013-04-14 DIAGNOSIS — I2510 Atherosclerotic heart disease of native coronary artery without angina pectoris: Secondary | ICD-10-CM

## 2013-04-14 DIAGNOSIS — E669 Obesity, unspecified: Secondary | ICD-10-CM

## 2013-04-14 DIAGNOSIS — I509 Heart failure, unspecified: Secondary | ICD-10-CM

## 2013-04-14 DIAGNOSIS — E785 Hyperlipidemia, unspecified: Secondary | ICD-10-CM

## 2013-04-14 DIAGNOSIS — I10 Essential (primary) hypertension: Secondary | ICD-10-CM

## 2013-04-14 DIAGNOSIS — J449 Chronic obstructive pulmonary disease, unspecified: Secondary | ICD-10-CM

## 2013-04-14 DIAGNOSIS — R262 Difficulty in walking, not elsewhere classified: Secondary | ICD-10-CM

## 2013-04-14 DIAGNOSIS — R0602 Shortness of breath: Secondary | ICD-10-CM

## 2013-04-14 DIAGNOSIS — R079 Chest pain, unspecified: Secondary | ICD-10-CM

## 2013-04-14 NOTE — Assessment & Plan Note (Signed)
I suspect his shortness of breath is secondary to underlying COPD, obesity, deconditioning. Unable to exclude mild diastolic CHF or ischemia. Stress test has been ordered to rule out ischemia. Unable to treadmill secondary to severe shortness of breath.

## 2013-04-14 NOTE — Progress Notes (Signed)
Patient ID: Victor Daniels, male    DOB: Jun 15, 1946, 67 y.o.   MRN: [id number]  HPI Comments: 67 year old gentleman patient of Dr. Darrick Huntsman, with a history of coronary artery disease, non-Q wave MI in the setting of multiorgan failure in November 2010 in [place], at that time with congestive heart failure and LV dysfunction with ejection fraction 25%, COPD, single kidney with previous renal failure in. On dialysis, CVA x2 with short-term memory loss, history of heavy alcohol abuse and smoking who presents for routine followup. Notes indicate ejection fraction of 40% in 2007, 25% in 2010 at the time when he was drinking heavily, most recent ejection fraction was normal after he stopped drinking. Unable to obtain cardiac catheterization report. He stopped smoking 3 years ago, smoking history for 30 years  Overall he reports that he's been doing well. He is concerned about shortness of breath with exertion. He is unable to do any significant walking secondary to symptoms. He states that he has been having significant wheezing.   He is not taking Lasix, only HCTZ. He takes his inhalers. Weight continues to be a major problem. He does not do any regular exercise. Minimal edema of the left lower extremity. No chest pain. Takes Lasix every other day, aspirin daily  EKG shows normal sinus rhythm with rate 76 beats per minute, no significant ST or T wave changes   Outpatient Encounter Prescriptions as of 04/14/2013  Medication Sig Dispense Refill  . albuterol (PROVENTIL HFA;VENTOLIN HFA) 108 (90 BASE) MCG/ACT inhaler Inhale 2 puffs into the lungs every 6 (six) hours as needed for wheezing or shortness of breath.  1 Inhaler  3  . albuterol (PROVENTIL) (2.5 MG/3ML) 0.083% nebulizer solution Take 2.5 mg by nebulization every 6 (six) hours as needed.      . carvedilol (COREG) 3.125 MG tablet Take 3.125 mg by mouth 2 (two) times daily with a meal.      . clopidogrel (PLAVIX) 75 MG tablet Take 75 mg by  mouth every other day.      . Fluticasone-Salmeterol (ADVAIR) 500-50 MCG/DOSE AEPB Inhale 1 puff into the lungs every 12 (twelve) hours.      . hydrochlorothiazide (HYDRODIURIL) 25 MG tablet Take 25 mg by mouth daily.      Marland Kitchen lisinopril (PRINIVIL,ZESTRIL) 20 MG tablet Take 1 tablet (20 mg total) by mouth daily.  90 tablet  3  . nitroGLYCERIN (NITROSTAT) 0.4 MG SL tablet Place 1 tablet (0.4 mg total) under the tongue every 5 (five) minutes as needed for chest pain.  50 tablet  3  . simvastatin (ZOCOR) 20 MG tablet Take 1 tablet (20 mg total) by mouth at bedtime.  90 tablet  3  . [DISCONTINUED] budesonide-formoterol (SYMBICORT) 80-4.5 MCG/ACT inhaler Inhale 2 puffs into the lungs 2 (two) times daily.      . [DISCONTINUED] doxycycline (VIBRA-TABS) 100 MG tablet Take 1 tablet (100 mg total) by mouth 2 (two) times daily.  20 tablet  0  . [DISCONTINUED] ipratropium (ATROVENT) 0.03 % nasal spray Place 2 sprays into the nose 3 (three) times daily as needed for rhinitis. prn  90 mL  3  . [DISCONTINUED] predniSONE (STERAPRED UNI-PAK) 10 MG tablet 6 tablets daily for 2 days, decrease by 1 tablet every 2 days until gone (12 day taper)  42 tablet  0  . [DISCONTINUED] tiotropium (SPIRIVA) 18 MCG inhalation capsule Place 18 mcg into inhaler and inhale daily.       No facility-administered encounter medications on  file as of 04/14/2013.    Review of Systems  Constitutional: Negative.   HENT: Negative.   Eyes: Negative.   Respiratory: Positive for shortness of breath.   Cardiovascular: Positive for leg swelling.  Gastrointestinal: Negative.   Musculoskeletal: Negative.   Skin: Negative.   Neurological: Negative.   Psychiatric/Behavioral: Negative.   All other systems reviewed and are negative.    BP 158/82  Pulse 76  Ht 6' (1.829 m)  Wt 284 lb 8 oz (129.048 kg)  BMI 38.58 kg/m2  Physical Exam  Nursing note and vitals reviewed. Constitutional: He is oriented to person, place, and time. He appears  well-developed and well-nourished.  obesity  HENT:  Head: Normocephalic.  Nose: Nose normal.  Mouth/Throat: Oropharynx is clear and moist.  Eyes: Conjunctivae are normal. Pupils are equal, round, and reactive to light.  Neck: Normal range of motion. Neck supple. No JVD present.  Cardiovascular: Normal rate, regular rhythm, S1 normal, S2 normal, normal heart sounds and intact distal pulses.  Exam reveals no gallop and no friction rub.   No murmur heard. Discoloration of his lower extremities secondary to venous insufficiency, chronic  Pulmonary/Chest: Effort normal and breath sounds normal. No respiratory distress. He has no wheezes. He has no rales. He exhibits no tenderness.  Abdominal: Soft. Bowel sounds are normal. He exhibits no distension. There is no tenderness.  Musculoskeletal: Normal range of motion. He exhibits edema. He exhibits no tenderness.  Lymphadenopathy:    He has no cervical adenopathy.  Neurological: He is alert and oriented to person, place, and time. Coordination normal.  Skin: Skin is warm and dry. No rash noted. No erythema.  Psychiatric: He has a normal mood and affect. His behavior is normal. Judgment and thought content normal.      Assessment and Plan

## 2013-04-14 NOTE — Patient Instructions (Addendum)
You are doing well. No medication changes were made.  We will schedule you for a stress test on April 30th   Please call us if you have new issues that need to be addressed before your next appt.  Your physician wants you to follow-up in: 6 months.  You will receive a reminder letter in the mail two months in advance. If you don't receive a letter, please call our office to schedule the follow-up appointment.  [facility]  Your caregiver has ordered a Stress Test with nuclear imaging. The purpose of this test is to evaluate the blood supply to your heart muscle. This procedure is referred to as a "Non-Invasive Stress Test." This is because other than having an IV started in your vein, nothing is inserted or "invades" your body. Cardiac stress tests are done to find areas of poor blood flow to the heart by determining the extent of coronary artery disease (CAD). Some patients exercise on a treadmill, which naturally increases the blood flow to your heart, while others who are  unable to walk on a treadmill due to physical limitations have a pharmacologic/chemical stress agent called Lexiscan . This medicine will mimic walking on a treadmill by temporarily increasing your coronary blood flow.   Please note: these test may take anywhere between 2-4 hours to complete  PLEASE REPORT TO [facility]TR[facility]  THE VOLUNTEERS AT THE FIRST DESK WILL DIRECT YOU WHERE TO GO  Date of Procedure:__________4/30/14___________________________  Arrival Time for Procedure:_________10:15 am_____________________  Instructions regarding medication:   ____ : Hold diabetes medication morning of procedure  _x___:  Hold betablocker(s) night before procedure and morning of procedure- carvedilol/coreg  ____:  Hold other medications as  follows:_________________________________________________________________________________________________________________________________________________________________________________________________________________________________________________________________________________________  PLEASE NOTIFY THE OFFICE AT LEAST 24 HOURS IN ADVANCE IF YOU ARE UNABLE TO KEEP YOUR APPOINTMENT.  [phone]  How to prepare for your [facility] test:  1. Do not eat or drink after midnight 2. No caffeine for 24 hours prior to test 3. Your medication may be taken with water.  If your doctor stopped a medication because of this test, do not take that medication. 4. Ladies, please do not wear dresses.  Skirts or pants are appropriate. Please wear a short sleeve shirt. 5. No perfume, cologne or lotion. 6. Wear comfortable walking shoes. No heels!

## 2013-04-14 NOTE — Assessment & Plan Note (Signed)
Will start his workup with a stress test. If this shows no ischemia, could perform echocardiogram to evaluate right ventricular systolic pressures. He may benefit from very low-dose diuretics. Would need to proceed carefully given his kidney issues. For now we have suggested he stay on HCTZ and watch his fluid intake.

## 2013-04-14 NOTE — Assessment & Plan Note (Addendum)
Blood pressure elevated. He we'll closely monitor this. Plenty of room for additional medication modification if this continues to run high

## 2013-04-14 NOTE — Assessment & Plan Note (Signed)
I suspect this is a major issue in addition to his weight contributing to shortness of breath

## 2013-04-14 NOTE — Assessment & Plan Note (Signed)
Stress test ordered to rule out ischemia. We'll [place] to [place]

## 2013-04-14 NOTE — Assessment & Plan Note (Signed)
Cholesterol is at goal on the current lipid regimen. No changes to the medications were made.  

## 2013-04-14 NOTE — Assessment & Plan Note (Signed)
We have encouraged continued exercise, careful diet management in an effort to lose weight. I suspect this is a major contributor to his shortness of breath.

## 2013-04-16 ENCOUNTER — Ambulatory Visit: Payer: Medicare Other | Admitting: Internal Medicine

## 2013-04-17 ENCOUNTER — Ambulatory Visit (INDEPENDENT_AMBULATORY_CARE_PROVIDER_SITE_OTHER): Payer: Medicare Other | Admitting: Internal Medicine

## 2013-04-17 VITALS — BP 140/82 | HR 63 | Temp 98.5°F | Resp 14 | Wt 293.8 lb

## 2013-04-17 DIAGNOSIS — J441 Chronic obstructive pulmonary disease with (acute) exacerbation: Secondary | ICD-10-CM

## 2013-04-17 MED ORDER — PREDNISONE (PAK) 10 MG PO TABS: ORAL_TABLET | ORAL | Status: DC

## 2013-04-17 MED ORDER — LEVOFLOXACIN 500 MG PO TABS: 500.0000 mg | ORAL_TABLET | Freq: Every day | ORAL | Status: DC

## 2013-04-17 MED ORDER — ALBUTEROL SULFATE HFA 108 (90 BASE) MCG/ACT IN AERS: 2.0000 | INHALATION_SPRAY | Freq: Four times a day (QID) | RESPIRATORY_TRACT | Status: DC | PRN
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
## 2013-04-17 NOTE — Patient Instructions (Addendum)
Please go ahead with the stress test.  I have never had a patient have a bad time from it .  Levaquin/prednisone for one week  For your bronchitis.

## 2013-04-19 ENCOUNTER — Encounter: Payer: Self-pay | Admitting: Internal Medicine

## 2013-04-19 NOTE — Assessment & Plan Note (Signed)
With one week history of increased sputum, chest tightness and malaise. Empiric antibiotics and steroid taper.

## 2013-04-21 ENCOUNTER — Telehealth: Payer: Self-pay

## 2013-04-21 NOTE — Telephone Encounter (Signed)
See below

## 2013-04-21 NOTE — Telephone Encounter (Signed)
Message copied by [facility], Brylie Sneath E on Mon Apr 21, 2013  3:37 PM ------      Message from: Coralee Rud      Created: Mon Apr 21, 2013  1:30 PM      Regarding: Victor Daniels Stress Test April 30       Pt called and wants to cancel stress test. FYI.Marland KitchenMarland KitchenStates he cannot get anyone to tell him a ballpark figure he will have to pay. He states he knows he will have to pay 20%, but wants to know a figure. I gave him the contact # for authorizations in [place], he states they could not help him. I gave him the # for billing, he called back and stated they could not help him either.  I told him I would try to call and get an answer for him, and he stated he did not want to have the test done anyway. ------

## 2013-04-21 NOTE — Telephone Encounter (Signed)
lmtcb

## 2013-04-22 NOTE — Telephone Encounter (Signed)
lmom that I am cancelling stress test per pt message

## 2013-04-23 NOTE — Telephone Encounter (Signed)
FYI See below 

## 2013-04-23 NOTE — Telephone Encounter (Signed)
i called [facility] to see if pt showed for stress test since i am unable to get in touch with him. They tell me he did not show I will make MD aware

## 2013-05-28 ENCOUNTER — Ambulatory Visit (INDEPENDENT_AMBULATORY_CARE_PROVIDER_SITE_OTHER)
Admission: RE | Admit: 2013-05-28 | Discharge: 2013-05-28 | Disposition: A | Discharge status: Home / Self Care | Payer: Medicare Other | Source: Ambulatory Visit | Attending: Internal Medicine | Admitting: Internal Medicine

## 2013-05-28 ENCOUNTER — Ambulatory Visit (INDEPENDENT_AMBULATORY_CARE_PROVIDER_SITE_OTHER): Payer: Medicare Other | Admitting: Internal Medicine

## 2013-05-28 ENCOUNTER — Encounter: Payer: Self-pay | Admitting: Internal Medicine

## 2013-05-28 VITALS — BP 162/92 | HR 84 | Temp 98.2°F | Resp 16 | Wt 297.5 lb

## 2013-05-28 DIAGNOSIS — R0609 Other forms of dyspnea: Secondary | ICD-10-CM

## 2013-05-28 DIAGNOSIS — Z79899 Other long term (current) drug therapy: Secondary | ICD-10-CM

## 2013-05-28 DIAGNOSIS — R0600 Dyspnea, unspecified: Secondary | ICD-10-CM

## 2013-05-28 DIAGNOSIS — R0989 Other specified symptoms and signs involving the circulatory and respiratory systems: Secondary | ICD-10-CM

## 2013-05-28 DIAGNOSIS — J441 Chronic obstructive pulmonary disease with (acute) exacerbation: Secondary | ICD-10-CM

## 2013-05-28 LAB — BASIC METABOLIC PANEL
BUN: 18 mg/dL (ref 6–23)
CO2: 31 mEq/L (ref 19–32)
Chloride: 102 mEq/L (ref 96–112)
Creatinine, Ser: 1.1 mg/dL (ref 0.4–1.5)

## 2013-05-28 MED ORDER — FUROSEMIDE 20 MG PO TABS: 20.0000 mg | ORAL_TABLET | Freq: Every day | ORAL | Status: DC

## 2013-05-28 NOTE — Patient Instructions (Addendum)
I am prescribing a stronger diuretic than hctz. Take the furosemide daily in the morning for 4 days and follow your weights daily to see if we are making some progress.   DO NOT TAKE HCTZ AND FUROSEMIDE ON THE SAME DAY  If you need a potassium supplement based on your labs today ,  I will call it in and let you know  Chest X Ray at [facility] office
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
## 2013-05-29 ENCOUNTER — Encounter: Payer: Self-pay | Admitting: Internal Medicine

## 2013-05-29 ENCOUNTER — Other Ambulatory Visit: Payer: Self-pay | Admitting: Internal Medicine

## 2013-05-29 NOTE — Assessment & Plan Note (Signed)
Suggested by history,  With BNP and CXR reassuring that he is not in decompensated failure.  If the short trial of lasxi does not help will repeat prednisone trail for 10 days. continue advair and albuterol

## 2013-06-09 ENCOUNTER — Ambulatory Visit (INDEPENDENT_AMBULATORY_CARE_PROVIDER_SITE_OTHER): Payer: Medicare Other | Admitting: Internal Medicine

## 2013-06-09 ENCOUNTER — Encounter: Payer: Self-pay | Admitting: Internal Medicine

## 2013-06-09 VITALS — BP 140/88 | HR 75 | Temp 98.1°F | Resp 14 | Wt 293.5 lb

## 2013-06-09 DIAGNOSIS — J4489 Other specified chronic obstructive pulmonary disease: Secondary | ICD-10-CM

## 2013-06-09 DIAGNOSIS — R7989 Other specified abnormal findings of blood chemistry: Secondary | ICD-10-CM

## 2013-06-09 DIAGNOSIS — R946 Abnormal results of thyroid function studies: Secondary | ICD-10-CM

## 2013-06-09 DIAGNOSIS — J449 Chronic obstructive pulmonary disease, unspecified: Secondary | ICD-10-CM

## 2013-06-09 DIAGNOSIS — D649 Anemia, unspecified: Secondary | ICD-10-CM

## 2013-06-09 DIAGNOSIS — E538 Deficiency of other specified B group vitamins: Secondary | ICD-10-CM

## 2013-06-09 DIAGNOSIS — Z79899 Other long term (current) drug therapy: Secondary | ICD-10-CM

## 2013-06-09 LAB — B12 AND FOLATE PANEL: Vitamin B-12: 248 pg/mL (ref 211–911)

## 2013-06-09 LAB — IRON AND TIBC
Iron: 76 ug/dL (ref 42–165)
TIBC: 304 ug/dL (ref 215–435)
UIBC: 228 ug/dL (ref 125–400)

## 2013-06-09 LAB — CBC WITH DIFFERENTIAL/PLATELET
Basophils Absolute: 0.1 10*3/uL (ref 0.0–0.1)
HCT: 37.6 % — ABNORMAL LOW (ref 39.0–52.0)
Lymphocytes Relative: 32 % (ref 12.0–46.0)
Lymphs Abs: 1.6 10*3/uL (ref 0.7–4.0)
Monocytes Relative: 7.2 % (ref 3.0–12.0)
Neutrophils Relative %: 52.2 % (ref 43.0–77.0)
Platelets: 166 10*3/uL (ref 150.0–400.0)
RDW: 14.6 % (ref 11.5–14.6)

## 2013-06-09 LAB — COMPREHENSIVE METABOLIC PANEL
Alkaline Phosphatase: 44 U/L (ref 39–117)
BUN: 17 mg/dL (ref 6–23)
Glucose, Bld: 78 mg/dL (ref 70–99)
Total Bilirubin: 0.6 mg/dL (ref 0.3–1.2)

## 2013-06-09 LAB — FERRITIN: Ferritin: 48.4 ng/mL (ref 22.0–322.0)

## 2013-06-09 LAB — T4, FREE: Free T4: 0.84 ng/dL (ref 0.60–1.60)

## 2013-06-09 MED ORDER — PREDNISONE 10 MG PO TABS: ORAL_TABLET | ORAL | Status: DC

## 2013-06-09 NOTE — Patient Instructions (Addendum)
12 day prednisone taper  (60 mg  60 mg.  50 mg  50 mg,   40 40 30 30 20 20 10 10  done )   Resume htcz and stop the furosemide
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
## 2013-06-10 ENCOUNTER — Encounter: Payer: Self-pay | Admitting: Internal Medicine

## 2013-06-10 NOTE — Assessment & Plan Note (Addendum)
No significant improvement with use of of mild diuretic. Will stop the furosemide, resume HCTZ and  treat with 2 week prednisone taper and have patient avoid going outside during days of high humidity and heat.

## 2013-06-18 ENCOUNTER — Ambulatory Visit (INDEPENDENT_AMBULATORY_CARE_PROVIDER_SITE_OTHER): Payer: Medicare Other | Admitting: Internal Medicine

## 2013-06-18 ENCOUNTER — Encounter: Payer: Self-pay | Admitting: Internal Medicine

## 2013-06-18 VITALS — BP 138/74 | HR 84 | Temp 98.4°F | Resp 16 | Wt 284.8 lb

## 2013-06-18 DIAGNOSIS — J449 Chronic obstructive pulmonary disease, unspecified: Secondary | ICD-10-CM

## 2013-06-18 DIAGNOSIS — J988 Other specified respiratory disorders: Secondary | ICD-10-CM

## 2013-06-18 DIAGNOSIS — J300 Vasomotor rhinitis: Secondary | ICD-10-CM

## 2013-06-18 DIAGNOSIS — J398 Other specified diseases of upper respiratory tract: Secondary | ICD-10-CM

## 2013-06-18 DIAGNOSIS — R0600 Dyspnea, unspecified: Secondary | ICD-10-CM

## 2013-06-18 DIAGNOSIS — R0609 Other forms of dyspnea: Secondary | ICD-10-CM

## 2013-06-18 DIAGNOSIS — J309 Allergic rhinitis, unspecified: Secondary | ICD-10-CM

## 2013-06-18 DIAGNOSIS — J441 Chronic obstructive pulmonary disease with (acute) exacerbation: Secondary | ICD-10-CM

## 2013-06-18 MED ORDER — CYANOCOBALAMIN 1000 MCG SL SUBL: 1.0000 | SUBLINGUAL_TABLET | Freq: Every day | SUBLINGUAL | Status: DC

## 2013-06-18 MED ORDER — MONTELUKAST SODIUM 10 MG PO TABS: 10.0000 mg | ORAL_TABLET | Freq: Every day | ORAL | Status: DC
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
## 2013-06-18 NOTE — Patient Instructions (Addendum)
Your blood work suggests that yuor allergies are aggravating your sinus and lung symptoms.  should be taking generic allegra (fexofenadine,  180 mg daily)  I am adding Singulair (generic monteleukast) for your allergies.  To take daily  Finish the prednisone taper and follow up with Dr Kendrick Fries as soon as possible    I am ordering a CT of your chest and neck to figure out why your trachea is deviated   You might also get some benefit from a B12 supplement taken oralyl under the tongue (called to pharmacy)

## 2013-06-19 ENCOUNTER — Encounter: Payer: Self-pay | Admitting: Internal Medicine

## 2013-06-19 ENCOUNTER — Encounter: Payer: Self-pay | Admitting: Emergency Medicine

## 2013-06-19 DIAGNOSIS — J398 Other specified diseases of upper respiratory tract: Secondary | ICD-10-CM | POA: Insufficient documentation

## 2013-06-19 NOTE — Assessment & Plan Note (Signed)
May be related to goiter vs prior intubation cuasing scar tissue.,  Dysphagia noted ,  CT chest ordered.

## 2013-06-19 NOTE — Assessment & Plan Note (Signed)
Exacerbation slow to resolve this time.  Finish  12 day prednisone taper , continue Advair,  Adding singulalir for eosinophila.  Follow up with [facility] post CT of chest

## 2013-06-19 NOTE — Assessment & Plan Note (Signed)
Adding singulair and sterid nasal spray

## 2013-06-20 ENCOUNTER — Ambulatory Visit (INDEPENDENT_AMBULATORY_CARE_PROVIDER_SITE_OTHER): Payer: Medicare Other | Admitting: Internal Medicine

## 2013-06-20 ENCOUNTER — Encounter: Payer: Self-pay | Admitting: Internal Medicine

## 2013-06-20 ENCOUNTER — Telehealth: Payer: Self-pay | Admitting: Internal Medicine

## 2013-06-20 VITALS — BP 108/68 | HR 116 | Temp 99.3°F | Resp 18 | Wt 282.2 lb

## 2013-06-20 DIAGNOSIS — J9601 Acute respiratory failure with hypoxia: Secondary | ICD-10-CM

## 2013-06-20 DIAGNOSIS — J441 Chronic obstructive pulmonary disease with (acute) exacerbation: Secondary | ICD-10-CM

## 2013-06-20 DIAGNOSIS — J9600 Acute respiratory failure, unspecified whether with hypoxia or hypercapnia: Secondary | ICD-10-CM

## 2013-06-20 DIAGNOSIS — M25552 Pain in left hip: Secondary | ICD-10-CM

## 2013-06-20 DIAGNOSIS — M25559 Pain in unspecified hip: Secondary | ICD-10-CM

## 2013-06-20 LAB — BRAIN NATRIURETIC PEPTIDE: Brain Natriuretic Peptide: 31.3 pg/mL (ref 0.0–100.0)

## 2013-06-20 LAB — TROPONIN I: Troponin I: 0.02 ng/mL (ref <0.06)

## 2013-06-20 MED ORDER — PREDNISONE 10 MG PO TABS: ORAL_TABLET | ORAL | Status: DC

## 2013-06-20 MED ORDER — METHYLPREDNISOLONE ACETATE 80 MG/ML IJ SUSP
80.0000 mg | Freq: Once | INTRAMUSCULAR | Status: AC
  Administered 2013-06-23: 80 mg via INTRAMUSCULAR

## 2013-06-20 MED ORDER — METHYLPREDNISOLONE ACETATE 80 MG/ML IJ SUSP: 80.0000 mg | Freq: Once | INTRAMUSCULAR | Status: DC

## 2013-06-20 MED ORDER — ALBUTEROL SULFATE (2.5 MG/3ML) 0.083% IN NEBU: 2.5000 mg | INHALATION_SOLUTION | Freq: Four times a day (QID) | RESPIRATORY_TRACT | Status: DC | PRN

## 2013-06-20 MED ORDER — LEVOFLOXACIN 500 MG PO TABS: 500.0000 mg | ORAL_TABLET | Freq: Every day | ORAL | Status: DC

## 2013-06-20 MED ORDER — ALBUTEROL (5 MG/ML) CONTINUOUS INHALATION SOLN
2.5000 mg/h | INHALATION_SOLUTION | Freq: Once | RESPIRATORY_TRACT | Status: AC
  Administered 2013-06-23: 2.5 mg/h via RESPIRATORY_TRACT

## 2013-06-20 NOTE — Progress Notes (Signed)
Patient 02 at 83% on room air with 02 returned to 95% at 2L

## 2013-06-20 NOTE — Telephone Encounter (Signed)
Called patient stated he is okay waiting til 3:30 to see you, FYI
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
## 2013-06-20 NOTE — Assessment & Plan Note (Signed)
Based on exam, with ambulatory hypoxia .  Refuses to go to hospital will treat with steroids, nebs,  Home 02 and abx .  Cardiac enzymes and BNP

## 2013-06-20 NOTE — Telephone Encounter (Signed)
Patient Information:  Caller Name: Titus  Phone: [phone]  Patient: [name], Vangieson  Gender: Male  DOB: 09/16/1946  Age: 67 Years  PCP: Duncan Dull (Adults only)  Office Follow Up:  Does the office need to follow up with this patient?: Yes  Instructions For The Office: Office, disposition was "go to office now" pt has appt at 1530; is Dr Darrick Huntsman ok with appt time? or can pt be seen sooner? Please follow up with patient.  RN Note:  Pt has appt already scheduled for 1530 today  Symptoms  Reason For Call & Symptoms: BP is 104/69, P 105; pt has body aches, chest cough, cough is productive with white phelgm  Reviewed Health History In EMR: Yes  Reviewed Medications In EMR: Yes  Reviewed Allergies In EMR: Yes  Reviewed Surgeries / Procedures: Yes  Date of Onset of Symptoms: 06/19/2013  Treatments Tried: Robitussin, Advil  Treatments Tried Worked: No  Any Fever: Yes  Fever Taken: Oral  Fever Time Of Reading: 08:00:00  Fever Last Reading: 99.2  Guideline(s) Used:  Cough  Disposition Per Guideline:   Go to Office Now  Reason For Disposition Reached:   Wheezing is present  Advice Given:  N/A  Patient Will Follow Care Advice:  YES

## 2013-06-21 LAB — CK: Total CK: 53 U/L (ref 7–232)

## 2013-06-21 LAB — CREATININE KINASE MB: CK, MB: 1.5 ng/mL (ref 0.3–4.0)

## 2013-06-22 ENCOUNTER — Encounter: Payer: Self-pay | Admitting: Internal Medicine

## 2013-06-25 ENCOUNTER — Ambulatory Visit: Payer: Self-pay | Admitting: Internal Medicine

## 2013-06-30 ENCOUNTER — Telehealth: Payer: Self-pay | Admitting: Internal Medicine

## 2013-06-30 NOTE — Telephone Encounter (Signed)
His CT confirmed pneumonia.  He will need a follow up chext x ray in 6 weeks to cofirm  Resolution of the changes seen on the CT

## 2013-07-01 NOTE — Telephone Encounter (Signed)
Patient notified as requested and patient stated he is feeling much better.

## 2013-07-03 NOTE — Telephone Encounter (Signed)
Patient was seen in office

## 2013-07-08 ENCOUNTER — Ambulatory Visit (INDEPENDENT_AMBULATORY_CARE_PROVIDER_SITE_OTHER): Payer: Medicare Other | Admitting: Pulmonary Disease

## 2013-07-08 ENCOUNTER — Encounter: Payer: Self-pay | Admitting: Pulmonary Disease

## 2013-07-08 VITALS — BP 122/78 | HR 76 | Temp 98.5°F | Ht 72.0 in | Wt 285.0 lb

## 2013-07-08 DIAGNOSIS — J449 Chronic obstructive pulmonary disease, unspecified: Secondary | ICD-10-CM

## 2013-07-08 DIAGNOSIS — J690 Pneumonitis due to inhalation of food and vomit: Secondary | ICD-10-CM | POA: Insufficient documentation

## 2013-07-08 DIAGNOSIS — J189 Pneumonia, unspecified organism: Secondary | ICD-10-CM

## 2013-07-08 MED ORDER — AMOXICILLIN-POT CLAVULANATE 875-125 MG PO TABS: 1.0000 | ORAL_TABLET | Freq: Two times a day (BID) | ORAL | Status: AC

## 2013-07-08 NOTE — Progress Notes (Signed)
Subjective:    Patient ID: Victor Daniels, male    DOB: 1946-11-25, 67 y.o.   MRN: [id number]  Synopsis: Victor Daniels has COPD (GOLD stage III and vasomotor rhinitis) who was first seen by the [facility] in 09/2011 for the same.    HPI  12/06/11 ROV --Very pleasant male with GOLD stage III COPD and vasomotor rhinitis presents for follow up.  He states that he has been exercising more and is not limited by shortness of breath.  He rarely uses his combivent inhaler more than once a day, twice is rare.  He has been raking leaves, walking for exercise.  He notes that the ipratropium nasal spray has been very helpful for his allergic rhinitis.  He was taken to [facility] on 10/26/11 for syncope and was treated for bronchitis with avelox, but it sounds like he didn't have cough, shortness of breath or fever.  02/28/12 ROV --doing fairly well. Gets some short of breath with exertion. Pushing himself until lungs start burning.  He is walking 20 minutes at a time with his dog but is not back in the gym.  He does not have problems with cough, chest pain or swelling.  Otherwise he is doing well and still using his inhalers.  Combivent helps with shortness of breath on exertion.  12/03/2012 ROV -- Victor Daniels is doing well.  He has been active every day in his yard getting up leaves.  He is not having trouble with shortness of breath or cough.  Sometimes he has to slow down if he pushes himself to hard.  Rest and a rescue inhaler help with this.  He has fallen into the donut hole so he has recently switched to Symbicort and Spiriva since he is relying on samples from [place] until Jan 2014.  07/08/2013 ROV > Victor Daniels has had a hard time since the last visit. He saw his primary care physician in June with shortness of breath, wheezing, and cough. He was treated with Solu-Medrol and Levaquin and ended up having a CT scan performed in early July which showed right lower lobe pneumonia. He tells me that he does not  have fevers or chills his cough has improved but in general his breathing is still not quite back to baseline. He tells that he frequently chokes on food and he thinks that he likely aspirated this time. He says he's had multiple swallowing test in the past medical all come back "normal". It he continues to use his Advair twice a day. He was placed on oxygen around the time of the pneumonia.   Past Medical History  Diagnosis Date  . COPD (chronic obstructive pulmonary disease)   . CHF (congestive heart failure)     ischemic CM.  EF 25%  . Hyperlipidemia   . Hypertension   . Heart attack 11/16/09  . OSA (obstructive sleep apnea)     not on CPAP  . DVT (deep venous thrombosis)   . Alcoholic gastritis   . CVA (cerebral infarction)     residual short term memory loss  . CAD (coronary artery disease)   . Venous insufficiency of leg   . History of alcohol abuse 2005    now abstinent for years     Review of Systems  Constitutional: Positive for fatigue. Negative for fever and chills.  HENT: Negative for nosebleeds, congestion and rhinorrhea.   Respiratory: Positive for cough and shortness of breath. Negative for wheezing.   Cardiovascular: Positive for chest pain. Negative  for palpitations and leg swelling.      Objective:   Physical Exam  Filed Vitals:   07/08/13 1029  BP: 122/78  Pulse: 76  Temp: 98.5 F (36.9 C)  TempSrc: Oral  Height: 6' (1.829 m)  Weight: 285 lb (129.275 kg)  SpO2: 96%   Walked 500 feet in the office on 07/08/2013 and did not drop oxygen saturation below 93%   Gen: well appearing, no acute distress HEENT: NCAT, PERRL, EOMi, OP clear, Neck: supple without masses PULM: Diminished throughout, inspiratory crackles right base CV: RRR, no mgr, no JVD Ext: warm, no clubbing, no cyanosis  July 2014 CT chest with contrast>> right lower lobe patchy groundglass opacification with right middle lobe and right lower lobe atelectasis and scarring. Mild  mediastinal lymphadenopathy    Assessment & Plan:   COPD (chronic obstructive pulmonary disease) Victor Daniels had a flare of COPD which I think is primarily due to the right lower lobe pneumonia identified by his PCP.  He is not wheezing today but doesn't quite feel back to baseline.  He did not desaturate while walking 500 feet on room air today in the office.  Plan: -continue Advair -hold off on further steroids -see pneumonia  Aspiration pneumonia In general, Victor Daniels is getting better in that he is no longer hypoxemic and not coughing up as much. However, he still doesn't feel back to baseline and he continues to have crackles on exam.  I think that his pneumonia was likely aspiration pneumonia since he says that he chokes on food often and the changes were seen in the RLL.  Some of the findings on his CT scan were related to his prior empyema resection.  He says that he has had multiple swallow tests in the past which were all normal.  Plan: -reviewed proper swallowing technique (chew well, chin tuck, etc.) -Augmentin for 7 more days with probiotic -repeat CT chest in 3 months ordered -reviewed red flag symptoms to call us if worse    Updated Medication List Outpatient Encounter Prescriptions as of 07/08/2013  Medication Sig Dispense Refill  . albuterol (PROVENTIL HFA;VENTOLIN HFA) 108 (90 BASE) MCG/ACT inhaler Inhale 2 puffs into the lungs every 6 (six) hours as needed for wheezing or shortness of breath.  1 Inhaler  3  . albuterol (PROVENTIL) (2.5 MG/3ML) 0.083% nebulizer solution Take 2.5 mg by nebulization every 6 (six) hours as needed.      Marland Kitchen albuterol (PROVENTIL) (2.5 MG/3ML) 0.083% nebulizer solution Take 3 mLs (2.5 mg total) by nebulization every 6 (six) hours as needed for wheezing.  75 mL  12  . carvedilol (COREG) 3.125 MG tablet Take 3.125 mg by mouth 2 (two) times daily with a meal.      . clopidogrel (PLAVIX) 75 MG tablet Take 75 mg by mouth every other day.      .  Cyanocobalamin 1000 MCG SUBL Place 1 tablet (1,000 mcg total) under the tongue daily.  90 tablet  3  . Fluticasone-Salmeterol (ADVAIR) 500-50 MCG/DOSE AEPB Inhale 1 puff into the lungs every 12 (twelve) hours.      . hydrochlorothiazide (HYDRODIURIL) 25 MG tablet Take 25 mg by mouth daily.      Marland Kitchen levofloxacin (LEVAQUIN) 500 MG tablet Take 1 tablet (500 mg total) by mouth daily.  7 tablet  0  . levofloxacin (LEVAQUIN) 500 MG tablet Take 1 tablet (500 mg total) by mouth daily.  7 tablet  0  . lisinopril (PRINIVIL,ZESTRIL) 20 MG tablet Take 1 tablet (  20 mg total) by mouth daily.  90 tablet  3  . montelukast (SINGULAIR) 10 MG tablet Take 1 tablet (10 mg total) by mouth at bedtime.  30 tablet  3  . nitroGLYCERIN (NITROSTAT) 0.4 MG SL tablet Place 1 tablet (0.4 mg total) under the tongue every 5 (five) minutes as needed for chest pain.  50 tablet  3  . predniSONE (DELTASONE) 10 MG tablet 6 tablets daily for 2 days then taper by 1 tablet every 2 days until gone  42 tablet  0  . simvastatin (ZOCOR) 20 MG tablet Take 1 tablet (20 mg total) by mouth at bedtime.  90 tablet  3   Facility-Administered Encounter Medications as of 07/08/2013  Medication Dose Route Frequency Provider Last Rate Last Dose  . methylPREDNISolone acetate (DEPO-MEDROL) injection 80 mg  80 mg Intramuscular Once [name], MD

## 2013-07-08 NOTE — Assessment & Plan Note (Signed)
Victor Daniels had a flare of COPD which I think is primarily due to the right lower lobe pneumonia identified by his PCP.  He is not wheezing today but doesn't quite feel back to baseline.  He did not desaturate while walking 500 feet on room air today in the office.  Plan: -continue Advair -hold off on further steroids -see pneumonia

## 2013-07-08 NOTE — Patient Instructions (Signed)
Make sure you chew your food well and tuck your chin when you swallow  Take the augmentin for one week. Take it with yogurt or a probiotic  We will schedule a follow up CT chest for October and see you after that  Keep using your medications as you are doing  We will see you back in Early October

## 2013-07-08 NOTE — Assessment & Plan Note (Signed)
In general, Victor Daniels is getting better in that he is no longer hypoxemic and not coughing up as much. However, he still doesn't feel back to baseline and he continues to have crackles on exam.  I think that his pneumonia was likely aspiration pneumonia since he says that he chokes on food often and the changes were seen in the RLL.  Some of the findings on his CT scan were related to his prior empyema resection.  He says that he has had multiple swallow tests in the past which were all normal.  Plan: -reviewed proper swallowing technique (chew well, chin tuck, etc.) -Augmentin for 7 more days with probiotic -repeat CT chest in 3 months ordered -reviewed red flag symptoms to call us if worse

## 2013-07-11 ENCOUNTER — Encounter: Payer: Self-pay | Admitting: Internal Medicine

## 2013-07-22 ENCOUNTER — Other Ambulatory Visit: Payer: Self-pay | Admitting: *Deleted

## 2013-07-22 ENCOUNTER — Other Ambulatory Visit (INDEPENDENT_AMBULATORY_CARE_PROVIDER_SITE_OTHER): Payer: Medicare Other

## 2013-07-22 DIAGNOSIS — Z1211 Encounter for screening for malignant neoplasm of colon: Secondary | ICD-10-CM

## 2013-07-23 ENCOUNTER — Encounter: Payer: Self-pay | Admitting: Internal Medicine

## 2013-07-25 NOTE — Telephone Encounter (Signed)
Mailed unread message to patient.  

## 2013-07-30 ENCOUNTER — Other Ambulatory Visit: Payer: Self-pay

## 2013-08-03 ENCOUNTER — Encounter: Payer: Self-pay | Admitting: Internal Medicine

## 2013-08-06 NOTE — Telephone Encounter (Signed)
With his condition should i advise him to keep this in place longer.

## 2013-08-15 ENCOUNTER — Telehealth: Payer: Self-pay | Admitting: Pulmonary Disease

## 2013-08-15 DIAGNOSIS — J449 Chronic obstructive pulmonary disease, unspecified: Secondary | ICD-10-CM

## 2013-08-15 NOTE — Telephone Encounter (Signed)
I spoke with [facility]. She is wanting to know if they can order an ONO on RA to see if he still is needing this at bedtime. Please advise Dr. Kendrick Fries thanks

## 2013-08-19 ENCOUNTER — Other Ambulatory Visit: Payer: Self-pay | Admitting: Internal Medicine

## 2013-08-20 NOTE — Telephone Encounter (Signed)
Fine by me 

## 2013-08-20 NOTE — Telephone Encounter (Signed)
Order has been sent and Angelica Chessman is aware.

## 2013-09-10 ENCOUNTER — Encounter: Payer: Self-pay | Admitting: Internal Medicine

## 2013-09-10 ENCOUNTER — Ambulatory Visit (INDEPENDENT_AMBULATORY_CARE_PROVIDER_SITE_OTHER): Payer: Medicare Other | Admitting: Internal Medicine

## 2013-09-10 VITALS — BP 154/88 | HR 82 | Temp 98.8°F | Resp 16 | Ht 72.0 in | Wt 289.5 lb

## 2013-09-10 DIAGNOSIS — Z23 Encounter for immunization: Secondary | ICD-10-CM

## 2013-09-10 DIAGNOSIS — J449 Chronic obstructive pulmonary disease, unspecified: Secondary | ICD-10-CM

## 2013-09-10 DIAGNOSIS — I10 Essential (primary) hypertension: Secondary | ICD-10-CM

## 2013-09-10 DIAGNOSIS — D649 Anemia, unspecified: Secondary | ICD-10-CM

## 2013-09-10 DIAGNOSIS — I2510 Atherosclerotic heart disease of native coronary artery without angina pectoris: Secondary | ICD-10-CM
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
## 2013-09-11 ENCOUNTER — Telehealth: Payer: Self-pay | Admitting: Internal Medicine

## 2013-09-11 ENCOUNTER — Encounter: Payer: Self-pay | Admitting: Internal Medicine

## 2013-09-11 DIAGNOSIS — D649 Anemia, unspecified: Secondary | ICD-10-CM | POA: Insufficient documentation

## 2013-09-11 MED ORDER — CARVEDILOL 3.125 MG PO TABS: 3.1250 mg | ORAL_TABLET | Freq: Two times a day (BID) | ORAL | Status: DC

## 2013-09-11 MED ORDER — MONTELUKAST SODIUM 10 MG PO TABS: 10.0000 mg | ORAL_TABLET | Freq: Every day | ORAL | Status: DC

## 2013-09-11 MED ORDER — LISINOPRIL 20 MG PO TABS: 20.0000 mg | ORAL_TABLET | Freq: Every day | ORAL | Status: DC

## 2013-09-11 MED ORDER — CYANOCOBALAMIN 1000 MCG SL SUBL: 1.0000 | SUBLINGUAL_TABLET | Freq: Every day | SUBLINGUAL | Status: DC

## 2013-09-11 MED ORDER — HYDROCHLOROTHIAZIDE 25 MG PO TABS: 25.0000 mg | ORAL_TABLET | Freq: Every day | ORAL | Status: DC

## 2013-09-11 MED ORDER — SIMVASTATIN 20 MG PO TABS: 20.0000 mg | ORAL_TABLET | Freq: Every day | ORAL | Status: DC

## 2013-09-11 MED ORDER — CLOPIDOGREL BISULFATE 75 MG PO TABS: 75.0000 mg | ORAL_TABLET | Freq: Every day | ORAL | Status: DC

## 2013-09-11 NOTE — Telephone Encounter (Signed)
90 day srcipts for all oral meds printed for patient to pickup

## 2013-09-11 NOTE — Assessment & Plan Note (Addendum)
Improving with B12 supplementation  Hemoccult was negative,  iron studies were normal and he has normal renal function

## 2013-09-11 NOTE — Assessment & Plan Note (Signed)
Elevated today.  Reviewed list of meds, patient is not taking OTC meds that could be causing,. It.  Have asked patient to recheck bp at home a minimum of 5 times over the next 4 weeks and call readings to office for adjustment of medications.   

## 2013-09-11 NOTE — Assessment & Plan Note (Signed)
He is currently asymptomatic and not requiring supplemental oxygen. Continue current inhaled therapy. Warned to avoid crowds and use sterile saline rinses twice daily during the flu season to mitigate risk of infection he did receive the flu shot today. He will be called when the Prevnar vaccine is available.Victor Daniels

## 2013-09-11 NOTE — Telephone Encounter (Signed)
Patient notified scripts ready for pickup.  

## 2013-09-11 NOTE — Assessment & Plan Note (Addendum)
He was referred to cardiology for stress testing in April but did not complete the evaluation due to cost. 2-D echocardiogram showed normal systolic function and no wall motion abnormalities.

## 2013-09-26 ENCOUNTER — Ambulatory Visit: Payer: Self-pay | Admitting: Pulmonary Disease

## 2013-09-30 ENCOUNTER — Encounter: Payer: Self-pay | Admitting: Pulmonary Disease

## 2013-09-30 ENCOUNTER — Telehealth: Payer: Self-pay | Admitting: *Deleted

## 2013-09-30 NOTE — Telephone Encounter (Signed)
Message copied by Caryl Ada on Tue Sep 30, 2013  1:56 PM ------      Message from: Max Fickle B      Created: Tue Sep 30, 2013  1:24 PM       L,            Please let him know that his CT chest looked better            Thanks,      B ------

## 2013-09-30 NOTE — Telephone Encounter (Signed)
Pt is aware of results. 

## 2013-10-08 DIAGNOSIS — R339 Retention of urine, unspecified: Secondary | ICD-10-CM | POA: Insufficient documentation

## 2013-10-16 ENCOUNTER — Encounter: Payer: Self-pay | Admitting: Cardiovascular Disease

## 2013-10-16 ENCOUNTER — Ambulatory Visit (INDEPENDENT_AMBULATORY_CARE_PROVIDER_SITE_OTHER): Payer: Medicare Other | Admitting: Cardiovascular Disease

## 2013-10-16 VITALS — BP 150/90 | HR 74 | Ht 72.0 in | Wt 290.0 lb

## 2013-10-16 DIAGNOSIS — R0602 Shortness of breath: Secondary | ICD-10-CM

## 2013-10-16 DIAGNOSIS — I10 Essential (primary) hypertension: Secondary | ICD-10-CM

## 2013-10-16 DIAGNOSIS — I509 Heart failure, unspecified: Secondary | ICD-10-CM

## 2013-10-16 DIAGNOSIS — I2510 Atherosclerotic heart disease of native coronary artery without angina pectoris: Secondary | ICD-10-CM

## 2013-10-16 DIAGNOSIS — E669 Obesity, unspecified: Secondary | ICD-10-CM

## 2013-10-16 NOTE — Assessment & Plan Note (Signed)
Currently with no symptoms of angina. No further workup at this time. Continue current medication regimen. 

## 2013-10-16 NOTE — Assessment & Plan Note (Signed)
We have encouraged continued exercise, careful diet management in an effort to lose weight. 

## 2013-10-16 NOTE — Assessment & Plan Note (Signed)
Blood pressure is elevated today. He reports that he is stressed. We have recommended he closely monitor his blood pressure at home and call our office if it runs high

## 2013-10-16 NOTE — Progress Notes (Signed)
Patient ID: Victor Daniels, male    DOB: 1946/01/07, 67 y.o.   MRN: [id number]  HPI Comments: 67 year old gentleman patient of Dr. Darrick Huntsman, with a history of heavy alcohol use, smoking, coronary artery disease, non-Q wave MI in the setting of multiorgan failure in November 2010 in [place], at that time with congestive heart failure and LV dysfunction with ejection fraction 25%, COPD, single kidney with previous renal failure in. On dialysis, CVA x2 with short-term memory loss, who presents for routine followup. Notes indicate ejection fraction of 40% in 2007, 25% in 2010 at the time when he was drinking heavily, most recent ejection fraction in 2012 was greater than 55% after he stopped drinking. Unable to obtain cardiac catheterization report. He stopped smoking several years ago, smoking history for 30 years  Overall he reports that he's been doing well. He is trying to work on his weight. This continues to be a problem. He is active, reports he is more stressed recently as he is working on [employer], doing volunteering.   He takes his inhalers.  He does not do any regular exercise. Marland Kitchen No chest pain.He was unable to tolerate a strict diet as recommended by Dr. Darrick Huntsman. He reports his leg edema has improved. Blood pressure at home by his report is typically in the 130 range.  EKG shows normal sinus rhythm with rate 74 beats per minute, no significant ST or T wave changes   Outpatient Encounter Prescriptions as of 10/16/2013  Medication Sig Dispense Refill  . carvedilol (COREG) 3.125 MG tablet Take 1 tablet (3.125 mg total) by mouth 2 (two) times daily with a meal.  180 tablet  3  . clopidogrel (PLAVIX) 75 MG tablet Take 1 tablet (75 mg total) by mouth daily.  90 tablet  3  . Fluticasone-Salmeterol (ADVAIR) 500-50 MCG/DOSE AEPB Inhale 1 puff into the lungs every 12 (twelve) hours.      . hydrochlorothiazide (HYDRODIURIL) 25 MG tablet Take 1 tablet (25 mg total) by mouth daily.  90 tablet   3  . ipratropium (ATROVENT) 0.03 % nasal spray Place 2 sprays into the nose 2 (two) times daily as needed for rhinitis.      Marland Kitchen lisinopril (PRINIVIL,ZESTRIL) 20 MG tablet Take 1 tablet (20 mg total) by mouth daily.  90 tablet  3  . montelukast (SINGULAIR) 10 MG tablet Take 1 tablet (10 mg total) by mouth at bedtime.  90 tablet  3  . nitroGLYCERIN (NITROSTAT) 0.4 MG SL tablet Place 1 tablet (0.4 mg total) under the tongue every 5 (five) minutes as needed for chest pain.  50 tablet  3  . simvastatin (ZOCOR) 20 MG tablet Take 1 tablet (20 mg total) by mouth at bedtime.  90 tablet  3  . VENTOLIN HFA 108 (90 BASE) MCG/ACT inhaler INHALE TWO PUFFS EVERY 6 HOURS AS NEEDED FOR WHEEZING OR SHORTNESS OF BREATH  18 each  2  . [DISCONTINUED] albuterol (PROVENTIL) (2.5 MG/3ML) 0.083% nebulizer solution Take 2.5 mg by nebulization every 6 (six) hours as needed.      . [DISCONTINUED] albuterol (PROVENTIL) (2.5 MG/3ML) 0.083% nebulizer solution Take 3 mLs (2.5 mg total) by nebulization every 6 (six) hours as needed for wheezing.  75 mL  12  . [DISCONTINUED] Cyanocobalamin 1000 MCG SUBL Place 1 tablet (1,000 mcg total) under the tongue daily.  90 tablet  3  . [DISCONTINUED] levofloxacin (LEVAQUIN) 500 MG tablet Take 1 tablet (500 mg total) by mouth daily.  7 tablet  0  . [DISCONTINUED] levofloxacin (LEVAQUIN) 500 MG tablet Take 1 tablet (500 mg total) by mouth daily.  7 tablet  0  . [DISCONTINUED] predniSONE (DELTASONE) 10 MG tablet 6 tablets daily for 2 days then taper by 1 tablet every 2 days until gone  42 tablet  0   No facility-administered encounter medications on file as of 10/16/2013.    Review of Systems  Constitutional: Negative.   HENT: Negative.   Eyes: Negative.   Respiratory: Positive for shortness of breath.   Cardiovascular: Negative.   Gastrointestinal: Negative.   Endocrine: Negative.   Musculoskeletal: Negative.   Skin: Negative.   Allergic/Immunologic: Negative.   Neurological:  Negative.   Hematological: Negative.   Psychiatric/Behavioral: Negative.   All other systems reviewed and are negative.    BP 150/90  Ht 6' (1.829 m)  Wt 290 lb (131.543 kg)  BMI 39.32 kg/m2  Physical Exam  Nursing note and vitals reviewed. Constitutional: He is oriented to person, place, and time. He appears well-developed and well-nourished.  obesity  HENT:  Head: Normocephalic.  Nose: Nose normal.  Mouth/Throat: Oropharynx is clear and moist.  Eyes: Conjunctivae are normal. Pupils are equal, round, and reactive to light.  Neck: Normal range of motion. Neck supple. No JVD present.  Cardiovascular: Normal rate, regular rhythm, S1 normal, S2 normal, normal heart sounds and intact distal pulses.  Exam reveals no gallop and no friction rub.   No murmur heard. Discoloration of his lower extremities secondary to venous insufficiency, chronic  Pulmonary/Chest: Effort normal and breath sounds normal. No respiratory distress. He has no wheezes. He has no rales. He exhibits no tenderness.  Abdominal: Soft. Bowel sounds are normal. He exhibits no distension. There is no tenderness.  Musculoskeletal: Normal range of motion. He exhibits edema. He exhibits no tenderness.  Lymphadenopathy:    He has no cervical adenopathy.  Neurological: He is alert and oriented to person, place, and time. Coordination normal.  Skin: Skin is warm and dry. No rash noted. No erythema.  Psychiatric: He has a normal mood and affect. His behavior is normal. Judgment and thought content normal.      Assessment and Plan

## 2013-10-16 NOTE — Assessment & Plan Note (Signed)
Ejection fraction improved since 2012, normal at that time. He is on HCTZ. Appears relatively euvolemic on today's visit. He does drink significant fluids and takes Lasix every other day. We have suggested he closely monitor his leg edema, abdominal bloating, breathing. For any weight gain or new symptoms concerning for CHF, we have suggested he take Lasix twice a day until symptoms improve.

## 2013-10-16 NOTE — Patient Instructions (Signed)
You are doing well. No medication changes were made.  Please call us if you have new issues that need to be addressed before your next appt.  Your physician wants you to follow-up in: 12 months.  You will receive a reminder letter in the mail two months in advance. If you don't receive a letter, please call our office to schedule the follow-up appointment. 

## 2013-10-16 NOTE — Assessment & Plan Note (Signed)
Mild shortness of breath from underlying COPD, deconditioning, obesity. Suggested he continue Lasix as he is high risk of diastolic CHF.

## 2013-10-23 ENCOUNTER — Other Ambulatory Visit: Payer: Self-pay | Admitting: *Deleted

## 2013-10-23 MED ORDER — IPRATROPIUM BROMIDE 0.03 % NA SOLN: 2.0000 | Freq: Two times a day (BID) | NASAL | Status: DC | PRN

## 2013-11-05 ENCOUNTER — Encounter: Payer: Self-pay | Admitting: Pulmonary Disease

## 2013-11-17 ENCOUNTER — Other Ambulatory Visit: Payer: Self-pay | Admitting: Internal Medicine

## 2013-12-11 ENCOUNTER — Ambulatory Visit (INDEPENDENT_AMBULATORY_CARE_PROVIDER_SITE_OTHER): Payer: Medicare Other | Admitting: Internal Medicine

## 2013-12-11 VITALS — BP 134/89 | HR 76 | Temp 98.5°F | Wt 299.0 lb

## 2013-12-11 DIAGNOSIS — Z862 Personal history of diseases of the blood and blood-forming organs and certain disorders involving the immune mechanism: Secondary | ICD-10-CM

## 2013-12-11 DIAGNOSIS — E785 Hyperlipidemia, unspecified: Secondary | ICD-10-CM

## 2013-12-11 DIAGNOSIS — J449 Chronic obstructive pulmonary disease, unspecified: Secondary | ICD-10-CM

## 2013-12-11 DIAGNOSIS — E669 Obesity, unspecified: Secondary | ICD-10-CM

## 2013-12-11 DIAGNOSIS — I2510 Atherosclerotic heart disease of native coronary artery without angina pectoris: Secondary | ICD-10-CM

## 2013-12-11 DIAGNOSIS — I509 Heart failure, unspecified: Secondary | ICD-10-CM

## 2013-12-11 DIAGNOSIS — I10 Essential (primary) hypertension: Secondary | ICD-10-CM

## 2013-12-11 LAB — CBC WITH DIFFERENTIAL/PLATELET
Basophils Absolute: 0 10*3/uL (ref 0.0–0.1)
Basophils Relative: 0.6 % (ref 0.0–3.0)
Eosinophils Absolute: 0.5 10*3/uL (ref 0.0–0.7)
Hemoglobin: 13.2 g/dL (ref 13.0–17.0)
Lymphocytes Relative: 31.5 % (ref 12.0–46.0)
Monocytes Absolute: 0.5 10*3/uL (ref 0.1–1.0)
Monocytes Relative: 8.8 % (ref 3.0–12.0)
Neutro Abs: 3.1 10*3/uL (ref 1.4–7.7)
Neutrophils Relative %: 50.7 % (ref 43.0–77.0)
RBC: 4.57 Mil/uL (ref 4.22–5.81)
RDW: 13.6 % (ref 11.5–14.6)

## 2013-12-11 LAB — COMPREHENSIVE METABOLIC PANEL
Alkaline Phosphatase: 54 U/L (ref 39–117)
BUN: 18 mg/dL (ref 6–23)
CO2: 30 mEq/L (ref 19–32)
Creatinine, Ser: 1.2 mg/dL (ref 0.4–1.5)
GFR: 64.78 mL/min (ref >60.00)
Glucose, Bld: 93 mg/dL (ref 70–99)
Total Bilirubin: 0.7 mg/dL (ref 0.3–1.2)
Total Protein: 6.9 g/dL (ref 6.0–8.3)

## 2013-12-11 LAB — HEMOGLOBIN A1C: Hgb A1c MFr Bld: 6.2 % (ref 4.6–6.5)

## 2013-12-11 LAB — LIPID PANEL
HDL: 55.8 mg/dL (ref >39.00)
Total CHOL/HDL Ratio: 3
Triglycerides: 96 mg/dL (ref 0.0–149.0)
VLDL: 19.2 mg/dL (ref 0.0–40.0)

## 2013-12-11 NOTE — Patient Instructions (Addendum)
You have gained 9 lbs since last visit!!  Substitute Atkins bars for the granola bars.  Try munching on the mini sweet peppers as a side dish to your sandwich  Instead of cheese doodles  Also try [employer] .  It is a good popcorn snack with less sugar and calories   We are checking your cholesterol today   For your inhalers:  Use advair OR symbicort twice daily (symbicort 2 puffs twice daily)  Use spiriva daily
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
## 2013-12-11 NOTE — Progress Notes (Signed)
Pre visit review using our clinic review tool, if applicable. No additional management support is needed unless otherwise documented below in the visit note. 

## 2013-12-14 NOTE — Assessment & Plan Note (Signed)
Lung exam is clear today.  Continue daily use of LABA .  Samples given of symbicort and spiriva.

## 2013-12-14 NOTE — Assessment & Plan Note (Signed)
Weight gain noted,  No significant edema on exam.

## 2013-12-14 NOTE — Assessment & Plan Note (Addendum)
At goal  on current statin therapy.   Liver enzymes are normal , no changes today.  Lab Results  Component Value Date   CHOL 154 12/11/2013   HDL 55.80 12/11/2013   LDLCALC 79 12/11/2013   TRIG 96.0 12/11/2013   CHOLHDL 3 12/11/2013   Lab Results  Component Value Date   ALT 20 12/11/2013   AST 15 12/11/2013   ALKPHOS 54 12/11/2013   BILITOT 0.7 12/11/2013

## 2013-12-14 NOTE — Assessment & Plan Note (Signed)
I have addressed  BMI and recommended wt loss of 10% of body weigh over the next 6 months using a low glycemic index diet and regular exercise a minimum of 5 days per week.   

## 2013-12-15 ENCOUNTER — Encounter: Payer: Self-pay | Admitting: Internal Medicine

## 2013-12-30 ENCOUNTER — Telehealth: Payer: Self-pay | Admitting: Internal Medicine

## 2013-12-30 NOTE — Telephone Encounter (Signed)
Pt states he does not want the prescriptions yet, wants us to tell mail order to hold them until he needs them.  Does not need any filled at this time but does want them called to [facility] so they are ready when he is.

## 2013-12-30 NOTE — Telephone Encounter (Signed)
Pt came into office with new insurance card.  Asking for prescriptions to now be called to [facility] mail order.  Card scanned, ins updated.

## 2013-12-31 ENCOUNTER — Encounter: Payer: Self-pay | Admitting: Internal Medicine

## 2013-12-31 NOTE — Telephone Encounter (Signed)
Is okay to send meds.

## 2014-01-01 MED ORDER — CARVEDILOL 3.125 MG PO TABS: 3.1250 mg | ORAL_TABLET | Freq: Two times a day (BID) | ORAL | Status: DC

## 2014-01-01 NOTE — Telephone Encounter (Signed)
Carvedilol was due and refill sent to [facility]

## 2014-01-02 MED ORDER — LISINOPRIL 20 MG PO TABS: 20.0000 mg | ORAL_TABLET | Freq: Every day | ORAL | Status: DC

## 2014-01-02 MED ORDER — HYDROCHLOROTHIAZIDE 25 MG PO TABS: 25.0000 mg | ORAL_TABLET | Freq: Every day | ORAL | Status: DC

## 2014-01-02 NOTE — Addendum Note (Signed)
Addended by: Dennie BibleAVIS, Anzleigh Slaven R on: 01/02/2014 11:53 AM   Modules accepted: Orders

## 2014-01-02 NOTE — Telephone Encounter (Signed)
This is a new pharmacy for patient due to insurance change so he has to have new scripts sent to pharmacy I called patient verified which ones he was running out of and refilled.

## 2014-01-06 ENCOUNTER — Other Ambulatory Visit: Payer: Self-pay | Admitting: Internal Medicine

## 2014-01-06 ENCOUNTER — Encounter: Payer: Self-pay | Admitting: Internal Medicine

## 2014-01-06 DIAGNOSIS — I2510 Atherosclerotic heart disease of native coronary artery without angina pectoris: Secondary | ICD-10-CM

## 2014-01-06 MED ORDER — CLOPIDOGREL BISULFATE 75 MG PO TABS: 75.0000 mg | ORAL_TABLET | ORAL | Status: DC

## 2014-01-15 ENCOUNTER — Encounter: Payer: Self-pay | Admitting: Internal Medicine

## 2014-01-15 MED ORDER — FLUTICASONE-SALMETEROL 500-50 MCG/DOSE IN AEPB: 1.0000 | INHALATION_SPRAY | Freq: Two times a day (BID) | RESPIRATORY_TRACT | Status: DC

## 2014-01-15 MED ORDER — ALBUTEROL SULFATE HFA 108 (90 BASE) MCG/ACT IN AERS: INHALATION_SPRAY | RESPIRATORY_TRACT | Status: DC

## 2014-01-20 ENCOUNTER — Encounter: Payer: Self-pay | Admitting: Internal Medicine

## 2014-01-20 MED ORDER — IPRATROPIUM BROMIDE 0.03 % NA SOLN: 2.0000 | Freq: Two times a day (BID) | NASAL | Status: DC | PRN

## 2014-03-12 ENCOUNTER — Ambulatory Visit: Payer: Medicare Other | Admitting: Internal Medicine

## 2014-03-20 ENCOUNTER — Other Ambulatory Visit: Payer: Self-pay | Admitting: *Deleted

## 2014-03-20 MED ORDER — SIMVASTATIN 20 MG PO TABS: 20.0000 mg | ORAL_TABLET | Freq: Every day | ORAL | Status: DC

## 2014-03-23 ENCOUNTER — Encounter: Payer: Self-pay | Admitting: Internal Medicine

## 2014-03-23 ENCOUNTER — Ambulatory Visit (INDEPENDENT_AMBULATORY_CARE_PROVIDER_SITE_OTHER): Payer: Medicare HMO | Admitting: Internal Medicine

## 2014-03-23 VITALS — BP 144/84 | HR 81 | Temp 98.1°F | Resp 18 | Wt 301.8 lb

## 2014-03-23 DIAGNOSIS — M25551 Pain in right hip: Secondary | ICD-10-CM

## 2014-03-23 DIAGNOSIS — M25559 Pain in unspecified hip: Secondary | ICD-10-CM

## 2014-03-23 DIAGNOSIS — E119 Type 2 diabetes mellitus without complications: Secondary | ICD-10-CM

## 2014-03-23 DIAGNOSIS — I5030 Unspecified diastolic (congestive) heart failure: Secondary | ICD-10-CM

## 2014-03-23 DIAGNOSIS — J441 Chronic obstructive pulmonary disease with (acute) exacerbation: Secondary | ICD-10-CM

## 2014-03-23 DIAGNOSIS — I509 Heart failure, unspecified: Secondary | ICD-10-CM

## 2014-03-23 MED ORDER — HYDROCODONE-ACETAMINOPHEN 5-325 MG PO TABS: 1.0000 | ORAL_TABLET | Freq: Four times a day (QID) | ORAL | Status: DC | PRN

## 2014-03-23 MED ORDER — PREDNISONE (PAK) 10 MG PO TABS: ORAL_TABLET | ORAL | Status: DC

## 2014-03-23 MED ORDER — DOXYCYCLINE HYCLATE 100 MG PO CAPS: 100.0000 mg | ORAL_CAPSULE | Freq: Two times a day (BID) | ORAL | Status: DC

## 2014-03-23 NOTE — Patient Instructions (Addendum)
Increase the furosemide to 40 mg daily for a few days( 3)  Use the nebulizer  Up to 4 times daily with albuterol,  hycrodocoen for cough and hip pain   Prednisone taper  For 6 days   Add the doxycycline (antibiotic)  WITH FOOD TWICE DAILY IF YOU DEVELOP COLORED SPUTUM  PLAIN FILMS OF THE RIGHT HIP WHEN YOU ARE FEELING BETTER

## 2014-03-23 NOTE — Progress Notes (Signed)
Pre-visit discussion using our clinic review tool. No additional management support is needed unless otherwise documented below in the visit note.  

## 2014-03-24 DIAGNOSIS — M169 Osteoarthritis of hip, unspecified: Secondary | ICD-10-CM | POA: Insufficient documentation

## 2014-03-24 LAB — COMPREHENSIVE METABOLIC PANEL
ALT: 20 U/L (ref 0–53)
AST: 18 U/L (ref 0–37)
Albumin: 4.2 g/dL (ref 3.5–5.2)
Alkaline Phosphatase: 57 U/L (ref 39–117)
BUN: 21 mg/dL (ref 6–23)
CO2: 33 mEq/L — ABNORMAL HIGH (ref 19–32)
CREATININE: 1.3 mg/dL (ref 0.4–1.5)
Calcium: 9.5 mg/dL (ref 8.4–10.5)
Chloride: 99 mEq/L (ref 96–112)
GFR: 57.43 mL/min — ABNORMAL LOW (ref >60.00)
GLUCOSE: 81 mg/dL (ref 70–99)
Potassium: 4 mEq/L (ref 3.5–5.1)
SODIUM: 140 meq/L (ref 135–145)
TOTAL PROTEIN: 7.3 g/dL (ref 6.0–8.3)
Total Bilirubin: 0.6 mg/dL (ref 0.3–1.2)

## 2014-03-24 LAB — HEMOGLOBIN A1C: HEMOGLOBIN A1C: 6.1 % (ref 4.6–6.5)

## 2014-03-24 LAB — BRAIN NATRIURETIC PEPTIDE: Pro B Natriuretic peptide (BNP): 47 pg/mL (ref 0.0–100.0)
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
## 2014-03-24 NOTE — Assessment & Plan Note (Signed)
Based on exam, but not hypoxic today or hypotensive.  will treat with steroids, nebs,  Home 02 and add abx if sputum turns purulent (doxy rx given)  Cardiac enzymes and BNP

## 2014-03-24 NOTE — Assessment & Plan Note (Signed)
He has increased LE edema toady and increased dyspnea,.  Increase lasix for 3 days to 40 mg daiy .

## 2014-03-24 NOTE — Assessment & Plan Note (Addendum)
Plain films ordered.  Steroid taper for COPD exacerbation may help.  vicdoin for pain .

## 2014-03-27 ENCOUNTER — Encounter: Payer: Self-pay | Admitting: Internal Medicine

## 2014-04-03 ENCOUNTER — Telehealth: Payer: Self-pay

## 2014-04-03 ENCOUNTER — Ambulatory Visit: Payer: Self-pay | Admitting: Internal Medicine

## 2014-04-03 NOTE — Telephone Encounter (Signed)
Relevant patient education assigned to patient using Emmi. ° °

## 2014-04-06 ENCOUNTER — Encounter: Payer: Self-pay | Admitting: Internal Medicine

## 2014-04-07 ENCOUNTER — Telehealth: Payer: Self-pay | Admitting: Internal Medicine

## 2014-04-07 DIAGNOSIS — M25551 Pain in right hip: Secondary | ICD-10-CM

## 2014-04-07 NOTE — Telephone Encounter (Signed)
Referral is in process as requested 

## 2014-04-07 NOTE — Telephone Encounter (Signed)
Plain films of right hip showed signigicant arthritic changes and mild dysplasia of the joint .  Left hip joint was normal. I am [facility]  [facility]

## 2014-04-07 NOTE — Telephone Encounter (Signed)
Patient stated that as long as the dr. Are approved by insurance. If not will have to find another orthopedist. Patient does want referral.

## 2014-04-07 NOTE — Assessment & Plan Note (Signed)
Plain films of right hip showed signigicant arthritic changes and mild dysplasia of the joint .  Left hip joint was normal. I am recommending orthopedic evaluation  By [facility]  

## 2014-04-08 ENCOUNTER — Other Ambulatory Visit: Payer: Self-pay | Admitting: Internal Medicine

## 2014-04-08 MED ORDER — HYDROCODONE-ACETAMINOPHEN 10-325 MG PO TABS: 1.0000 | ORAL_TABLET | Freq: Three times a day (TID) | ORAL | Status: DC | PRN

## 2014-04-08 NOTE — Progress Notes (Unsigned)
Patient notified

## 2014-04-13 ENCOUNTER — Encounter: Payer: Self-pay | Admitting: Internal Medicine

## 2014-04-13 NOTE — Telephone Encounter (Signed)
Any news on his referral?

## 2014-04-13 NOTE — Telephone Encounter (Signed)
He has [employer], and we have not got the ok to schedule an apt with [facility].

## 2014-04-16 ENCOUNTER — Telehealth: Payer: Self-pay | Admitting: Internal Medicine

## 2014-04-16 NOTE — Telephone Encounter (Signed)
Requesting Provider - Duncan Dulleresa Tullo  Treating Provider - Venita Lickahari Brooks  [facility]  Procedure -  [phone]- Unlisted E& M services  Visits - 4  Start Date - 4.16.15  Expires on Date - 7.15.15  Diagnosis - 719.45 Joint Pain -Pelvis

## 2014-04-17 ENCOUNTER — Telehealth: Payer: Self-pay | Admitting: Internal Medicine

## 2014-04-17 NOTE — Telephone Encounter (Signed)
Pt states [facility] and told to bring copy of his x-rays to his appt.  Advised pt he can go to [facility], 2nd desk on right is Radiology, and ask [facility].  Pt agrees.

## 2014-05-01 ENCOUNTER — Encounter: Payer: Self-pay | Admitting: Internal Medicine

## 2014-05-04 ENCOUNTER — Encounter: Payer: Self-pay | Admitting: Internal Medicine

## 2014-05-17 ENCOUNTER — Encounter: Payer: Self-pay | Admitting: Internal Medicine

## 2014-05-18 ENCOUNTER — Encounter: Payer: Self-pay | Admitting: Internal Medicine

## 2014-05-18 DIAGNOSIS — M25561 Pain in right knee: Secondary | ICD-10-CM

## 2014-05-19 MED ORDER — FLUTICASONE-SALMETEROL 500-50 MCG/DOSE IN AEPB: 1.0000 | INHALATION_SPRAY | Freq: Two times a day (BID) | RESPIRATORY_TRACT | Status: DC

## 2014-05-20 ENCOUNTER — Ambulatory Visit: Payer: Self-pay | Admitting: Internal Medicine

## 2014-05-21 ENCOUNTER — Encounter: Payer: Self-pay | Admitting: Internal Medicine

## 2014-05-25 ENCOUNTER — Telehealth: Payer: Self-pay | Admitting: Internal Medicine

## 2014-06-22 ENCOUNTER — Encounter: Payer: Self-pay | Admitting: Internal Medicine

## 2014-06-25 ENCOUNTER — Ambulatory Visit (INDEPENDENT_AMBULATORY_CARE_PROVIDER_SITE_OTHER): Payer: Medicare HMO | Admitting: Internal Medicine

## 2014-06-25 ENCOUNTER — Encounter: Payer: Self-pay | Admitting: Internal Medicine

## 2014-06-25 VITALS — BP 144/80 | HR 89 | Resp 16 | Ht 72.0 in | Wt 290.5 lb

## 2014-06-25 DIAGNOSIS — I10 Essential (primary) hypertension: Secondary | ICD-10-CM

## 2014-06-25 DIAGNOSIS — R238 Other skin changes: Secondary | ICD-10-CM

## 2014-06-25 DIAGNOSIS — E669 Obesity, unspecified: Secondary | ICD-10-CM

## 2014-06-25 DIAGNOSIS — I872 Venous insufficiency (chronic) (peripheral): Secondary | ICD-10-CM

## 2014-06-25 DIAGNOSIS — R233 Spontaneous ecchymoses: Secondary | ICD-10-CM

## 2014-06-25 DIAGNOSIS — I509 Heart failure, unspecified: Secondary | ICD-10-CM

## 2014-06-25 DIAGNOSIS — I5030 Unspecified diastolic (congestive) heart failure: Secondary | ICD-10-CM

## 2014-06-25 DIAGNOSIS — M25551 Pain in right hip: Secondary | ICD-10-CM

## 2014-06-25 DIAGNOSIS — M25559 Pain in unspecified hip: Secondary | ICD-10-CM

## 2014-06-25 NOTE — Patient Instructions (Addendum)
EAS Carb Contol chocolate  and vanilla shakes  Atkins shakes  Are also  Available at [facility]   You can use these  For breakfast,  They are lower calorie than Ensure  If you use the powdered EAS  Protein,  Use almond/coconut milk,  Unsweetened  45 cal/1 carb   You can add tylenol 1000 mg twice daily  To the aleve for pain control  Ok to suspend Plavix and use a baby or full aspirin daily   Look up the [employer] Fast diet as an alternative to Nutrisystem ,  It is available for review online   Return for fasting labs in October, before your next visit

## 2014-06-25 NOTE — Progress Notes (Signed)
Pre visit review using our clinic review tool, if applicable. No additional management support is needed unless otherwise documented below in the visit note. 
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
## 2014-06-28 DIAGNOSIS — D692 Other nonthrombocytopenic purpura: Secondary | ICD-10-CM | POA: Insufficient documentation

## 2014-06-28 NOTE — Assessment & Plan Note (Signed)
With venous stasis changes.  Advised use of compression stockings as much as possible

## 2014-06-28 NOTE — Assessment & Plan Note (Signed)
Well controlled on current regimen. Renal function stable, no changes today.  Lab Results  Component Value Date   CREATININE 1.3 03/24/2014   Lab Results  Component Value Date   NA 140 03/24/2014   K 4.0 03/24/2014   CL 99 03/24/2014   CO2 33* 03/24/2014

## 2014-06-28 NOTE — Assessment & Plan Note (Signed)
I have congratulated him in reduction of   BMI and encouraged  Continued weight loss with goal of 10% of body weighg over the next 6 months using a low glycemic index diet and regular exercise a minimum of 5 days per week.

## 2014-06-28 NOTE — Assessment & Plan Note (Addendum)
Secondary to moderate to severe DJD by plain films.  He is not interested in hip replacement until he loses more weight.  Continue NSAIDs and I will refill narcotics as needed.

## 2014-06-28 NOTE — Assessment & Plan Note (Signed)
He is currently asymptomatic.  No changes to meds today

## 2014-06-28 NOTE — Assessment & Plan Note (Signed)
He is requesting discontinuation of Plavix, which he has taken for years.  No history of CVA and no recent stent placement. Recommended use of baby aspirin daily instead

## 2014-06-28 NOTE — Assessment & Plan Note (Signed)
Currently asymptomatic, managed with LABA ,

## 2014-07-31 ENCOUNTER — Encounter: Payer: Self-pay | Admitting: Adult Health

## 2014-07-31 ENCOUNTER — Ambulatory Visit (INDEPENDENT_AMBULATORY_CARE_PROVIDER_SITE_OTHER): Payer: Medicare HMO | Admitting: Adult Health

## 2014-07-31 VITALS — BP 158/92 | HR 98 | Temp 98.3°F | Resp 18 | Ht 72.0 in | Wt 297.8 lb

## 2014-07-31 DIAGNOSIS — J441 Chronic obstructive pulmonary disease with (acute) exacerbation: Secondary | ICD-10-CM

## 2014-07-31 DIAGNOSIS — R600 Localized edema: Secondary | ICD-10-CM

## 2014-07-31 DIAGNOSIS — R609 Edema, unspecified: Secondary | ICD-10-CM

## 2014-07-31 DIAGNOSIS — IMO0002 Reserved for concepts with insufficient information to code with codable children: Secondary | ICD-10-CM

## 2014-07-31 MED ORDER — LEVOFLOXACIN 500 MG PO TABS: 500.0000 mg | ORAL_TABLET | Freq: Every day | ORAL | Status: DC

## 2014-07-31 NOTE — Progress Notes (Signed)
Pre-visit discussion using our clinic review tool. No additional management support is needed unless otherwise documented below in the visit note.  

## 2014-07-31 NOTE — Patient Instructions (Addendum)
   Increase lasix to 2 tablets for the next 3 days.  Elevate your legs as much as possible.  Start levaquin 500 mg daily for 10 days  Continue your nebulizer treatments at home. Call if no improvement within 4-5 days or sooner if necessary.  Do not pull the dressings off. Let them fall off on their own.

## 2014-07-31 NOTE — Progress Notes (Signed)
Patient ID: Victor Daniels, male   DOB: July 15, 1946, 68 y.o.   MRN: [id number]    Subjective:    Patient ID: Victor Daniels, male    DOB: 21-Mar-1946, 68 y.o.   MRN: [id number]  HPI Pt is a pleasant 68 y/o male who presents to clinic with the following concerns:  1. Cough He has recently be started on a prednisone taper. Reports increasing shortness of breath and wheezing. Has been using his nebulizer for wheezing.  2. Skin tear Ran into his car door mirror and developed a skin tear on his right arm. He also has a skin tear behind his left calf. Rubbed against something and caused a tear in his skin. This one is weeping.   3. Bilateral edema of lower extremity Hx of venous insufficiency. He has seen vascular in the past. Cannot wear compression stocking because he reports getting short of breath trying to get them on. Has noticed a slight increase in his edema in the last couple of days.   Past Medical History  Diagnosis Date  . COPD (chronic obstructive pulmonary disease)   . CHF (congestive heart failure)     ischemic CM.  EF 25%  . Hyperlipidemia   . Hypertension   . Heart attack 11/16/09  . OSA (obstructive sleep apnea)     not on CPAP  . DVT (deep venous thrombosis)   . Alcoholic gastritis   . CVA (cerebral infarction)     residual short term memory loss  . CAD (coronary artery disease)   . Venous insufficiency of leg   . History of alcohol abuse 2005    now abstinent for years    Current Outpatient Prescriptions on File Prior to Visit  Medication Sig Dispense Refill  . albuterol (VENTOLIN HFA) 108 (90 BASE) MCG/ACT inhaler INHALE TWO PUFFS EVERY 6 HOURS AS NEEDED FOR WHEEZING OR SHORTNESS OF BREATH  18 each  4  . carvedilol (COREG) 3.125 MG tablet Take 1 tablet (3.125 mg total) by mouth 2 (two) times daily with a meal.  180 tablet  3  . cyanocobalamin 1000 MCG tablet Take 100 mcg by mouth daily.      . fexofenadine (ALLEGRA) 180 MG tablet Take 180 mg by  mouth daily.      . Fluticasone-Salmeterol (ADVAIR) 500-50 MCG/DOSE AEPB Inhale 1 puff into the lungs every 12 (twelve) hours.  180 each  1  . furosemide (LASIX) 20 MG tablet Take 20 mg by mouth every other day.      . hydrochlorothiazide (HYDRODIURIL) 25 MG tablet Take 1 tablet (25 mg total) by mouth daily.  90 tablet  3  . ipratropium (ATROVENT) 0.03 % nasal spray Place 2 sprays into the nose 2 (two) times daily as needed for rhinitis.  30 mL  2  . lisinopril (PRINIVIL,ZESTRIL) 20 MG tablet Take 1 tablet (20 mg total) by mouth daily.  90 tablet  3  . naproxen sodium (ANAPROX) 220 MG tablet Take 440 mg by mouth 3 (three) times daily with meals.      . nitroGLYCERIN (NITROSTAT) 0.4 MG SL tablet Place 1 tablet (0.4 mg total) under the tongue every 5 (five) minutes as needed for chest pain.  50 tablet  3  . simvastatin (ZOCOR) 20 MG tablet Take 1 tablet (20 mg total) by mouth at bedtime.  90 tablet  1   No current facility-administered medications on file prior to visit.     Review of Systems  Constitutional: Negative.  Respiratory: Positive for cough, chest tightness, shortness of breath and wheezing.   Cardiovascular: Positive for leg swelling. Negative for chest pain and palpitations.  Gastrointestinal: Negative.   Genitourinary: Negative.   Skin:       Skin tear right arm and left lower extremity  Neurological: Negative.   Psychiatric/Behavioral: Negative.   All other systems reviewed and are negative.      Objective:  BP 158/92  Pulse 98  Temp(Src) 98.3 F (36.8 C) (Oral)  Resp 18  Ht 6' (1.829 m)  Wt 297 lb 12 oz (135.059 kg)  BMI 40.37 kg/m2  SpO2 95%   Physical Exam  Constitutional: He is oriented to person, place, and time. He appears well-developed and well-nourished. No distress.  HENT:  Head: Normocephalic and atraumatic.  Eyes: Conjunctivae and EOM are normal.  Cardiovascular: Normal rate, regular rhythm, normal heart sounds and intact distal pulses.  Exam  reveals no gallop and no friction rub.   No murmur heard. Pulmonary/Chest: Effort normal. No respiratory distress. He has wheezes. He has no rales.  Scattered rhonchi - does not clear with coughing. On prednisone taper.  Musculoskeletal: He exhibits edema (2+ pitting edema bil LE).  Neurological: He is alert and oriented to person, place, and time.  Skin:  Right forearm with skin tear. Left calf skin tear.   Psychiatric: He has a normal mood and affect. His behavior is normal. Judgment and thought content normal.      Assessment & Plan:   1. COPD exacerbation Continue prednisone taper. Add levaquin 500 mg daily x 10 days. Continue nebulizer treatments q6h for wheezing, shortness of breath. RTC if symptoms not improved within 4-5 days or sooner if necessary.  2. Skin tear Area cleased with saline and dried. Tegaderm applied to each skin tear.  3. Bilateral edema of lower extremity Increase lasix to bid for the next 3-4 days. Elevate lower extremities. Call if no improvement within 3-4 days.

## 2014-08-04 ENCOUNTER — Encounter: Payer: Self-pay | Admitting: Internal Medicine

## 2014-08-04 MED ORDER — FUROSEMIDE 20 MG PO TABS: 20.0000 mg | ORAL_TABLET | ORAL | Status: DC

## 2014-08-14 ENCOUNTER — Telehealth: Payer: Self-pay | Admitting: Internal Medicine

## 2014-08-14 NOTE — Telephone Encounter (Signed)
Received this message from pt please advise "My issues are sweating of the head, dizziness, headaches, numbness of the neck, tightness across the upper back, blood pressure swings from to high 159/124 to on the low side 103/68. Constant tingling of fingers on both hands. All the present while at rest seated. I can't wait to 09-25-14 to see Dr [name]...is there Anything SOONER???"

## 2014-08-14 NOTE — Telephone Encounter (Signed)
Spoke to pt, states these symptoms have been going on for awhile, has seen numerous specialists, pulmonary, cardiology, states he has been told it's not vertigo, just would like to talk to Dr. [name] about what to do from here. Not currently having any symptoms. Advised ED visit with symptoms of chest pain, increased SOB, verbalized understanding. Appt scheduled 08/20/14 to discuss.

## 2014-08-20 ENCOUNTER — Encounter: Payer: Self-pay | Admitting: Internal Medicine

## 2014-08-20 ENCOUNTER — Ambulatory Visit (INDEPENDENT_AMBULATORY_CARE_PROVIDER_SITE_OTHER): Payer: Medicare HMO | Admitting: Internal Medicine

## 2014-08-20 VITALS — BP 134/78 | HR 81 | Temp 98.3°F | Resp 16 | Ht 72.0 in | Wt 275.5 lb

## 2014-08-20 DIAGNOSIS — L97929 Non-pressure chronic ulcer of unspecified part of left lower leg with unspecified severity: Secondary | ICD-10-CM

## 2014-08-20 DIAGNOSIS — I209 Angina pectoris, unspecified: Secondary | ICD-10-CM

## 2014-08-20 DIAGNOSIS — E785 Hyperlipidemia, unspecified: Secondary | ICD-10-CM

## 2014-08-20 DIAGNOSIS — R072 Precordial pain: Secondary | ICD-10-CM

## 2014-08-20 DIAGNOSIS — I2510 Atherosclerotic heart disease of native coronary artery without angina pectoris: Secondary | ICD-10-CM

## 2014-08-20 DIAGNOSIS — I872 Venous insufficiency (chronic) (peripheral): Secondary | ICD-10-CM

## 2014-08-20 DIAGNOSIS — I10 Essential (primary) hypertension: Secondary | ICD-10-CM

## 2014-08-20 DIAGNOSIS — H814 Vertigo of central origin: Secondary | ICD-10-CM

## 2014-08-20 DIAGNOSIS — R410 Disorientation, unspecified: Secondary | ICD-10-CM

## 2014-08-20 DIAGNOSIS — I25119 Atherosclerotic heart disease of native coronary artery with unspecified angina pectoris: Secondary | ICD-10-CM

## 2014-08-20 DIAGNOSIS — Z79899 Other long term (current) drug therapy: Secondary | ICD-10-CM

## 2014-08-20 DIAGNOSIS — F05 Delirium due to known physiological condition: Secondary | ICD-10-CM

## 2014-08-20 LAB — CBC WITH DIFFERENTIAL/PLATELET
BASOS ABS: 0 10*3/uL (ref 0.0–0.1)
BASOS PCT: 0.4 % (ref 0.0–3.0)
EOS ABS: 0.1 10*3/uL (ref 0.0–0.7)
Eosinophils Relative: 0.9 % (ref 0.0–5.0)
HCT: 37.9 % — ABNORMAL LOW (ref 39.0–52.0)
Hemoglobin: 12.6 g/dL — ABNORMAL LOW (ref 13.0–17.0)
LYMPHS PCT: 31.2 % (ref 12.0–46.0)
Lymphs Abs: 3 10*3/uL (ref 0.7–4.0)
MCHC: 33.2 g/dL (ref 30.0–36.0)
MCV: 90.9 fl (ref 78.0–100.0)
MONO ABS: 0.8 10*3/uL (ref 0.1–1.0)
Monocytes Relative: 8.8 % (ref 3.0–12.0)
Neutro Abs: 5.6 10*3/uL (ref 1.4–7.7)
Neutrophils Relative %: 58.7 % (ref 43.0–77.0)
PLATELETS: 183 10*3/uL (ref 150.0–400.0)
RBC: 4.17 Mil/uL — ABNORMAL LOW (ref 4.22–5.81)
RDW: 13.8 % (ref 11.5–15.5)
WBC: 9.6 10*3/uL (ref 4.0–10.5)

## 2014-08-20 LAB — LIPID PANEL
CHOLESTEROL: 163 mg/dL (ref 0–200)
HDL: 67.3 mg/dL (ref >39.00)
LDL Cholesterol: 79 mg/dL (ref 0–99)
NonHDL: 95.7
Total CHOL/HDL Ratio: 2
Triglycerides: 86 mg/dL (ref 0.0–149.0)
VLDL: 17.2 mg/dL (ref 0.0–40.0)

## 2014-08-20 LAB — COMPREHENSIVE METABOLIC PANEL
ALBUMIN: 3.5 g/dL (ref 3.5–5.2)
ALK PHOS: 62 U/L (ref 39–117)
ALT: 35 U/L (ref 0–53)
AST: 26 U/L (ref 0–37)
BUN: 28 mg/dL — AB (ref 6–23)
CO2: 33 mEq/L — ABNORMAL HIGH (ref 19–32)
Calcium: 9.1 mg/dL (ref 8.4–10.5)
Chloride: 96 mEq/L (ref 96–112)
Creatinine, Ser: 1.3 mg/dL (ref 0.4–1.5)
GFR: 59.43 mL/min — ABNORMAL LOW (ref >60.00)
Glucose, Bld: 88 mg/dL (ref 70–99)
POTASSIUM: 3.5 meq/L (ref 3.5–5.1)
SODIUM: 139 meq/L (ref 135–145)
Total Bilirubin: 0.8 mg/dL (ref 0.2–1.2)
Total Protein: 6.6 g/dL (ref 6.0–8.3)

## 2014-08-20 LAB — URINALYSIS, ROUTINE W REFLEX MICROSCOPIC
BILIRUBIN URINE: NEGATIVE
Hgb urine dipstick: NEGATIVE
Ketones, ur: NEGATIVE
Leukocytes, UA: NEGATIVE
Nitrite: NEGATIVE
PH: 6 (ref 5.0–8.0)
RBC / HPF: NONE SEEN (ref 0–?)
Specific Gravity, Urine: 1.01 (ref 1.000–1.030)
TOTAL PROTEIN, URINE-UPE24: NEGATIVE
Urine Glucose: NEGATIVE
Urobilinogen, UA: 0.2 (ref 0.0–1.0)
WBC, UA: NONE SEEN (ref 0–?)

## 2014-08-20 LAB — POCT URINALYSIS DIPSTICK
BILIRUBIN UA: NEGATIVE
Glucose, UA: NEGATIVE
Ketones, UA: NEGATIVE
Leukocytes, UA: NEGATIVE
Nitrite, UA: NEGATIVE
Protein, UA: NEGATIVE
RBC UA: NEGATIVE
SPEC GRAV UA: 1.01
Urobilinogen, UA: 0.2
pH, UA: 5.5

## 2014-08-20 LAB — HEMOGLOBIN A1C: Hgb A1c MFr Bld: 6.3 % (ref 4.6–6.5)

## 2014-08-20 LAB — CK TOTAL AND CKMB (NOT AT ARMC)
CK TOTAL: 73 U/L (ref 7–232)
CK, MB: 1.3 ng/mL (ref 0.0–5.0)

## 2014-08-20 LAB — MAGNESIUM: Magnesium: 1.8 mg/dL (ref 1.5–2.5)

## 2014-08-20 LAB — TROPONIN I: TROPONIN I: 0.02 ng/mL (ref <0.06)

## 2014-08-20 LAB — BRAIN NATRIURETIC PEPTIDE: Pro B Natriuretic peptide (BNP): 59 pg/mL (ref 0.0–100.0)

## 2014-08-20 MED ORDER — CARVEDILOL 6.25 MG PO TABS: 3.1250 mg | ORAL_TABLET | Freq: Two times a day (BID) | ORAL | Status: DC

## 2014-08-20 NOTE — Assessment & Plan Note (Signed)
His EKG is unchanged.  He never had the Myoview that Dr Mariah Milling recommended because he states that he had too many unanswered questions about the risks of the procedure. I assured him that the procedure had a very low risk of complications.

## 2014-08-20 NOTE — Patient Instructions (Addendum)
I am increasing your carvedilol to 6.25 mg twice daily.  You cna use your current tablets; just take 2 twice daily until gone  Your attacks of vertigo and sweating may be due to an abnormal heart rhythm with or without a blockage of an artery in your heart  I am sending you to [facility] cardiology  To see Dr Mariah Milling tomorrow at 8 am   Please take a nitroglycerin tablet the night time you have chest pain   Reduce your lasix to once daily
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
## 2014-08-20 NOTE — Assessment & Plan Note (Signed)
Recurrent episodes accompaniedy by flushing  And headache in the setting of CAD .  Last episodes was this am.  Checking lyte  Cardiac enzyemes and EKG.,  Increasing carvedilol to 6.l25 mg bid and urgent cardiology eval

## 2014-08-20 NOTE — Assessment & Plan Note (Signed)
With venous ulcers that are oozing.  He has lost 22 lbs with lasix dosing and I am concerned that he has caused some renal dysfunciton or electrolyte distrubances.  Will reduce lasxi to once daily and refer to Wound Care for compression wraps to reduce his leg girth.

## 2014-08-20 NOTE — Progress Notes (Signed)
Pre-visit discussion using our clinic review tool. No additional management support is needed unless otherwise documented below in the visit note.  

## 2014-08-21 ENCOUNTER — Encounter: Payer: Self-pay | Admitting: Cardiovascular Disease

## 2014-08-21 ENCOUNTER — Encounter: Payer: Self-pay | Admitting: Internal Medicine

## 2014-08-21 ENCOUNTER — Ambulatory Visit (INDEPENDENT_AMBULATORY_CARE_PROVIDER_SITE_OTHER): Payer: Medicare HMO | Admitting: Cardiovascular Disease

## 2014-08-21 VITALS — BP 160/100 | HR 79 | Ht 72.0 in | Wt 275.5 lb

## 2014-08-21 DIAGNOSIS — R0602 Shortness of breath: Secondary | ICD-10-CM

## 2014-08-21 DIAGNOSIS — E669 Obesity, unspecified: Secondary | ICD-10-CM

## 2014-08-21 DIAGNOSIS — I2510 Atherosclerotic heart disease of native coronary artery without angina pectoris: Secondary | ICD-10-CM

## 2014-08-21 DIAGNOSIS — I25119 Atherosclerotic heart disease of native coronary artery with unspecified angina pectoris: Secondary | ICD-10-CM

## 2014-08-21 DIAGNOSIS — I10 Essential (primary) hypertension: Secondary | ICD-10-CM

## 2014-08-21 DIAGNOSIS — I5030 Unspecified diastolic (congestive) heart failure: Secondary | ICD-10-CM

## 2014-08-21 DIAGNOSIS — E785 Hyperlipidemia, unspecified: Secondary | ICD-10-CM

## 2014-08-21 DIAGNOSIS — I872 Venous insufficiency (chronic) (peripheral): Secondary | ICD-10-CM

## 2014-08-21 DIAGNOSIS — I509 Heart failure, unspecified: Secondary | ICD-10-CM

## 2014-08-21 DIAGNOSIS — I209 Angina pectoris, unspecified: Secondary | ICD-10-CM

## 2014-08-21 DIAGNOSIS — R42 Dizziness and giddiness: Secondary | ICD-10-CM

## 2014-08-21 LAB — URINE CULTURE
COLONY COUNT: NO GROWTH
ORGANISM ID, BACTERIA: NO GROWTH

## 2014-08-21 NOTE — Assessment & Plan Note (Signed)
He appears relatively euvolemic on today's visit. He is down to Lasix 20 mg daily, down from 20 mg twice a day

## 2014-08-21 NOTE — Patient Instructions (Addendum)
You are doing well. No medication changes were made.  We will schedule a stress test for shortness of breath  Please call us if you have new issues that need to be addressed before your next appt.  Your physician wants you to follow-up in: 6 months.  You will receive a reminder letter in the mail two months in advance. If you don't receive a letter, please call our office to schedule the follow-up appointment.  [facility] MYOVIEW  Your caregiver has ordered a Stress Test with nuclear imaging. The purpose of this test is to evaluate the blood supply to your heart muscle. This procedure is referred to as a "Non-Invasive Stress Test." This is because other than having an IV started in your vein, nothing is inserted or "invades" your body. Cardiac stress tests are done to find areas of poor blood flow to the heart by determining the extent of coronary artery disease (CAD). Some patients exercise on a treadmill, which naturally increases the blood flow to your heart, while others who are  unable to walk on a treadmill due to physical limitations have a pharmacologic/chemical stress agent called Lexiscan . This medicine will mimic walking on a treadmill by temporarily increasing your coronary blood flow.   Please note: these test may take anywhere between 2-4 hours to complete  PLEASE REPORT TO [facility]TR[facility]  THE VOLUNTEERS AT THE FIRST DESK WILL DIRECT YOU WHERE TO GO  Date of Procedure:______Wednesday, September 9___________  Arrival Time for Procedure:_______9:45am_____________________  Instructions regarding medication:   __X__:  Hold betablocker(s) night before procedure and morning of procedure:  CARVEDILOL   PLEASE NOTIFY THE OFFICE AT LEAST 24 HOURS IN ADVANCE IF YOU ARE UNABLE TO KEEP YOUR APPOINTMENT.  [phone] AND  PLEASE NOTIFY NUCLEAR MEDICINE AT [facility] AT LEAST 24 HOURS IN ADVANCE IF YOU ARE UNABLE TO KEEP YOUR APPOINTMENT. [phone]  How to prepare for your  Myoview test:  1. Do not eat or drink after midnight 2. No caffeine for 24 hours prior to test 3. No smoking 24 hours prior to test. 4. Your medication may be taken with water.  If your doctor stopped a medication because of this test, do not take that medication: CARVEDILOL 5. Ladies, please do not wear dresses.  Skirts or pants are appropriate. Please wear a short sleeve shirt. 6. No perfume, cologne or lotion. 7. Wear comfortable walking shoes. No heels!

## 2014-08-21 NOTE — Assessment & Plan Note (Signed)
Significant shortness of breath with exertion, possible ischemia. He is unable to treadmill for testing. We will order a pharmacologic Myoview, lexiscan to rule out ischemia

## 2014-08-21 NOTE — Assessment & Plan Note (Signed)
Blood pressure is well controlled on today's visit. No changes made to the medications. 

## 2014-08-21 NOTE — Assessment & Plan Note (Signed)
Close to goal total cholesterol less than 70. Recommended weight loss. Could consider higher statin if unable to reach goal

## 2014-08-21 NOTE — Assessment & Plan Note (Signed)
Recommended compression hose. He is unable to put these on secondary to severe hip arthritis

## 2014-08-21 NOTE — Assessment & Plan Note (Signed)
We have encouraged continued exercise, careful diet management in an effort to lose weight. 

## 2014-08-21 NOTE — Assessment & Plan Note (Signed)
Shortness of breath likely multifactorial including COPD, deconditioning, obesity. Unable to exclude ischemia. Pharmacologic stress test has been ordered

## 2014-08-21 NOTE — Progress Notes (Signed)
Patient ID: Victor Daniels, male    DOB: Mar 25, 1946, 68 y.o.   MRN: [id number]  HPI Comments: 68 year old gentleman patient of Dr. Darrick Huntsman, with a history of heavy alcohol use, long  Smoking history for at least 30 years, coronary artery disease, non-Q wave MI in the setting of multiorgan failure in November 2010 in [place], at that time with congestive heart failure and LV dysfunction with ejection fraction 25%, COPD, single kidney with previous renal failure. On dialysis in the past, CVA x2 with short-term memory loss, who presents for routine followup. He reports having lobectomy on the right in the past   ejection fraction of 40% in 2007, 25% in 2010 at the time when he was drinking heavily,   ejection fraction in 2012 was greater than 55% after he stopped drinking.   In followup today, he reports that he was previously taking Lasix 20 mg every other day.  After he had weight gain and leg edema, started taking Lasix 20 mg twice a day. On this he had a 15 pound weight loss, creatinine stable at 1.3 with climb in his BUN up to 28 Now taking Lasix 20 mg daily Reports his leg edema has significantly improved, still some cracking   He takes his inhalers.  He does not do any regular exercise. . does report significant shortness of breath with exertion  No chest pain.  Blood pressure at home by his report is typically in the 130 range.  EKG shows normal sinus rhythm with rate 78 beats per minute, no significant ST or T wave changes   Outpatient Encounter Prescriptions as of 08/21/2014  Medication Sig  . albuterol (VENTOLIN HFA) 108 (90 BASE) MCG/ACT inhaler INHALE TWO PUFFS EVERY 6 HOURS AS NEEDED FOR WHEEZING OR SHORTNESS OF BREATH  . aspirin 81 MG tablet Take 81 mg by mouth daily.  . carvedilol (COREG) 6.25 MG tablet Take 0.5 tablets (3.125 mg total) by mouth 2 (two) times daily with a meal.  . cyanocobalamin 1000 MCG tablet Take 100 mcg by mouth daily.  . fexofenadine (ALLEGRA) 180  MG tablet Take 180 mg by mouth daily.  . Fluticasone-Salmeterol (ADVAIR) 500-50 MCG/DOSE AEPB Inhale 1 puff into the lungs every 12 (twelve) hours.  . furosemide (LASIX) 20 MG tablet Take 20 mg by mouth 2 (two) times daily.  . hydrochlorothiazide (HYDRODIURIL) 25 MG tablet Take 1 tablet (25 mg total) by mouth daily.  Marland Kitchen ipratropium (ATROVENT) 0.03 % nasal spray Place 2 sprays into the nose 2 (two) times daily as needed for rhinitis.  Marland Kitchen lisinopril (PRINIVIL,ZESTRIL) 20 MG tablet Take 1 tablet (20 mg total) by mouth daily.  . naproxen sodium (ANAPROX) 220 MG tablet Take 440 mg by mouth 3 (three) times daily with meals.  . nitroGLYCERIN (NITROSTAT) 0.4 MG SL tablet Place 1 tablet (0.4 mg total) under the tongue every 5 (five) minutes as needed for chest pain.  . simvastatin (ZOCOR) 20 MG tablet Take 1 tablet (20 mg total) by mouth at bedtime.    Review of Systems  Constitutional: Negative.   HENT: Negative.   Eyes: Negative.   Respiratory: Positive for shortness of breath.   Cardiovascular: Positive for leg swelling.  Gastrointestinal: Negative.   Endocrine: Negative.   Musculoskeletal: Negative.   Skin: Negative.   Allergic/Immunologic: Negative.   Neurological: Negative.   Hematological: Negative.   Psychiatric/Behavioral: Negative.   All other systems reviewed and are negative.   BP 160/100  Pulse 79  Ht 6' (1.829  m)  Wt 275 lb 8 oz (124.966 kg)  BMI 37.36 kg/m2  Physical Exam  Nursing note and vitals reviewed. Constitutional: He is oriented to person, place, and time. He appears well-developed and well-nourished.  obesity  HENT:  Head: Normocephalic.  Nose: Nose normal.  Mouth/Throat: Oropharynx is clear and moist.  Eyes: Conjunctivae are normal. Pupils are equal, round, and reactive to light.  Neck: Normal range of motion. Neck supple. No JVD present.  Cardiovascular: Normal rate, regular rhythm, S1 normal, S2 normal, normal heart sounds and intact distal pulses.  Exam  reveals no gallop and no friction rub.   No murmur heard. Discoloration of his lower extremities secondary to venous insufficiency, chronic  Pulmonary/Chest: Effort normal and breath sounds normal. No respiratory distress. He has no wheezes. He has no rales. He exhibits no tenderness.  Abdominal: Soft. Bowel sounds are normal. He exhibits no distension. There is no tenderness.  Musculoskeletal: Normal range of motion. He exhibits edema. He exhibits no tenderness.  Lymphadenopathy:    He has no cervical adenopathy.  Neurological: He is alert and oriented to person, place, and time. Coordination normal.  Skin: Skin is warm and dry. No rash noted. No erythema.  Psychiatric: He has a normal mood and affect. His behavior is normal. Judgment and thought content normal.      Assessment and Plan

## 2014-08-24 NOTE — Telephone Encounter (Signed)
Mailed unread message to pt  

## 2014-08-25 ENCOUNTER — Other Ambulatory Visit: Payer: Self-pay | Admitting: Internal Medicine

## 2014-08-25 MED ORDER — FUROSEMIDE 20 MG PO TABS: 20.0000 mg | ORAL_TABLET | Freq: Two times a day (BID) | ORAL | Status: DC

## 2014-08-25 NOTE — Progress Notes (Signed)
Patient requested refill on furosemide refill sent electronically.

## 2014-08-26 ENCOUNTER — Encounter: Payer: Self-pay | Admitting: Internal Medicine

## 2014-09-01 ENCOUNTER — Encounter: Payer: Self-pay | Admitting: General Surgery

## 2014-09-02 ENCOUNTER — Other Ambulatory Visit: Payer: Self-pay

## 2014-09-02 ENCOUNTER — Ambulatory Visit: Payer: Self-pay | Admitting: Cardiovascular Disease

## 2014-09-02 DIAGNOSIS — R42 Dizziness and giddiness: Secondary | ICD-10-CM

## 2014-09-02 DIAGNOSIS — R0602 Shortness of breath: Secondary | ICD-10-CM

## 2014-09-07 ENCOUNTER — Encounter: Payer: Self-pay | Admitting: Internal Medicine

## 2014-09-07 ENCOUNTER — Ambulatory Visit (INDEPENDENT_AMBULATORY_CARE_PROVIDER_SITE_OTHER): Payer: Medicare HMO | Admitting: Internal Medicine

## 2014-09-07 VITALS — BP 136/80 | HR 81 | Temp 98.4°F | Resp 16 | Ht 72.0 in | Wt 290.2 lb

## 2014-09-07 DIAGNOSIS — R079 Chest pain, unspecified: Secondary | ICD-10-CM

## 2014-09-07 DIAGNOSIS — J449 Chronic obstructive pulmonary disease, unspecified: Secondary | ICD-10-CM

## 2014-09-07 DIAGNOSIS — M1631 Unilateral osteoarthritis resulting from hip dysplasia, right hip: Secondary | ICD-10-CM

## 2014-09-07 DIAGNOSIS — Z9889 Other specified postprocedural states: Secondary | ICD-10-CM

## 2014-09-07 DIAGNOSIS — Z905 Acquired absence of kidney: Secondary | ICD-10-CM | POA: Insufficient documentation

## 2014-09-07 DIAGNOSIS — I509 Heart failure, unspecified: Secondary | ICD-10-CM

## 2014-09-07 DIAGNOSIS — I5030 Unspecified diastolic (congestive) heart failure: Secondary | ICD-10-CM

## 2014-09-07 DIAGNOSIS — I872 Venous insufficiency (chronic) (peripheral): Secondary | ICD-10-CM

## 2014-09-07 DIAGNOSIS — M167 Other unilateral secondary osteoarthritis of hip: Secondary | ICD-10-CM

## 2014-09-07 HISTORY — DX: Other specified postprocedural states: Z98.890

## 2014-09-07 MED ORDER — FUROSEMIDE 20 MG PO TABS: 20.0000 mg | ORAL_TABLET | Freq: Two times a day (BID) | ORAL | Status: DC

## 2014-09-07 MED ORDER — NAPROXEN SODIUM 220 MG PO TABS: ORAL_TABLET | ORAL | Status: DC
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
## 2014-09-07 NOTE — Progress Notes (Signed)
Pre-visit discussion using our clinic review tool. No additional management support is needed unless otherwise documented below in the visit note.  

## 2014-09-07 NOTE — Patient Instructions (Signed)
You are doing well  Continue furosemide two tablets daily  For swelling  Return in 3 months

## 2014-09-08 ENCOUNTER — Encounter: Payer: Self-pay | Admitting: Internal Medicine

## 2014-09-08 NOTE — Assessment & Plan Note (Signed)
He has normal renal function

## 2014-09-08 NOTE — Assessment & Plan Note (Signed)
Repeat assessment with adenosine Myoview shoed normal KV function and no reversible ischemia

## 2014-09-08 NOTE — Assessment & Plan Note (Signed)
No reversible ischemia seen on myoview August 2015.

## 2014-09-08 NOTE — Assessment & Plan Note (Addendum)
He had no relief with prior steroid injection done by [facility] [name]. And is requesting referral to [facility] [name].  He is not taking narcotics by choice.

## 2014-09-08 NOTE — Assessment & Plan Note (Signed)
His disease is optimally managed.  He does not want to follow up with pulmonology at this time.  Meds reviewed

## 2014-09-08 NOTE — Assessment & Plan Note (Addendum)
Complicated by cor pulmonale With ulcerations, managed with [facility] recently with nearly 1005 resolution,  Advised to resume compression stocking use once ulcer has completely healed.  Continue furosemide 40 mg daily

## 2014-09-19 ENCOUNTER — Ambulatory Visit (INDEPENDENT_AMBULATORY_CARE_PROVIDER_SITE_OTHER): Payer: Commercial Managed Care - HMO

## 2014-09-19 DIAGNOSIS — Z23 Encounter for immunization: Secondary | ICD-10-CM

## 2014-09-21 ENCOUNTER — Telehealth: Payer: Self-pay

## 2014-09-21 MED ORDER — OXYCODONE-ACETAMINOPHEN 5-325 MG PO TABS: 1.0000 | ORAL_TABLET | Freq: Three times a day (TID) | ORAL | Status: DC | PRN

## 2014-09-21 NOTE — Telephone Encounter (Signed)
vicodin 10 /325 was given i Aril with no effect.  Will prescribe oxycodone /Apap (percocet) much much stronger. Remind him to take  Dulcolax every night to prevent constipation

## 2014-09-21 NOTE — Telephone Encounter (Signed)
Patient Knee and back pain is 8 out of 10 and cannot sleep due to the pain. Patient stated you prescribed Norco at one time but did not seem to help, just constipated him. Patient does see Ortho next week but wanted to know if he can get something for pain until then?

## 2014-09-21 NOTE — Telephone Encounter (Signed)
The patient called and is hoping to get a different pain medication.  He states the pain medicine he is on now is not working.  Pt callback - [phone]

## 2014-09-21 NOTE — Telephone Encounter (Signed)
Pt notified and  verbalized understanding. Rx placed up front for pick up 

## 2014-09-24 ENCOUNTER — Other Ambulatory Visit: Payer: Medicare HMO

## 2014-09-25 ENCOUNTER — Ambulatory Visit: Payer: Medicare HMO | Admitting: Internal Medicine

## 2014-09-28 ENCOUNTER — Telehealth: Payer: Self-pay | Admitting: Internal Medicine

## 2014-09-28 MED ORDER — OXYCODONE HCL 15 MG PO TABS: 15.0000 mg | ORAL_TABLET | Freq: Three times a day (TID) | ORAL | Status: DC | PRN

## 2014-09-28 NOTE — Telephone Encounter (Signed)
All medications to relieve severe pain are constipating.  He HAS to take dulcolax 10 mg daily at bedtime .  Oxy IR one tablet every 8 hours prn

## 2014-09-28 NOTE — Telephone Encounter (Signed)
Patient saw [facility] today and is scheduled for hip replacement at the end of the month, patient stated that the Oxycodone is to constipating and is not relieving the pain and wanted to know if there is something he can get until his surgery.

## 2014-09-29 ENCOUNTER — Encounter: Payer: Self-pay | Admitting: Internal Medicine

## 2014-09-29 NOTE — Telephone Encounter (Signed)
Patient notified medication ready for pickup and that he needs to take Dulcolax every night to avoid constipation.

## 2014-10-01 ENCOUNTER — Other Ambulatory Visit: Payer: Self-pay | Admitting: Internal Medicine

## 2014-10-05 NOTE — Telephone Encounter (Signed)
See other telephone note.  

## 2014-10-06 ENCOUNTER — Ambulatory Visit: Payer: Medicare HMO | Admitting: Pulmonary Disease

## 2014-10-08 ENCOUNTER — Ambulatory Visit: Payer: Self-pay | Admitting: Orthopedic Surgery

## 2014-10-08 LAB — BASIC METABOLIC PANEL
ANION GAP: 10 (ref 7–16)
BUN: 46 mg/dL — AB (ref 4–21)
BUN: 46 mg/dL — AB (ref 7–18)
CALCIUM: 9.4 mg/dL (ref 8.5–10.1)
Chloride: 90 mmol/L — ABNORMAL LOW (ref 98–107)
Co2: 35 mmol/L — ABNORMAL HIGH (ref 21–32)
Creatinine: 2.2 mg/dL — AB (ref 0.6–1.3)
Creatinine: 2.21 mg/dL — ABNORMAL HIGH (ref 0.60–1.30)
EGFR (African American): 38 — ABNORMAL LOW
EGFR (Non-African Amer.): 32 — ABNORMAL LOW
GLUCOSE: 117 mg/dL — AB (ref 65–99)
GLUCOSE: 117 mg/dL
OSMOLALITY: 283 (ref 275–301)
POTASSIUM: 3 mmol/L — AB (ref 3.4–5.3)
Potassium: 3 mmol/L — ABNORMAL LOW (ref 3.5–5.1)
Sodium: 135 mmol/L — ABNORMAL LOW (ref 136–145)
Sodium: 136 mmol/L — AB (ref 137–147)

## 2014-10-08 LAB — APTT: Activated PTT: 37.1 secs — ABNORMAL HIGH (ref 23.6–35.9)

## 2014-10-08 LAB — PROTIME-INR
INR: 1.1
Prothrombin Time: 13.6 secs (ref 11.5–14.7)

## 2014-10-08 LAB — CBC
HCT: 38.5 % — AB (ref 40.0–52.0)
HGB: 12.7 g/dL — ABNORMAL LOW (ref 13.0–18.0)
MCH: 29.7 pg (ref 26.0–34.0)
MCHC: 32.9 g/dL (ref 32.0–36.0)
MCV: 90 fL (ref 80–100)
PLATELETS: 221 10*3/uL (ref 150–440)
RBC: 4.27 10*6/uL — ABNORMAL LOW (ref 4.40–5.90)
RDW: 13.6 % (ref 11.5–14.5)
WBC: 7.6 10*3/uL (ref 3.8–10.6)

## 2014-10-08 LAB — MRSA PCR SCREENING

## 2014-10-08 LAB — URINALYSIS, COMPLETE
Bacteria: NONE SEEN
Bilirubin,UR: NEGATIVE
Blood: NEGATIVE
GLUCOSE, UR: NEGATIVE mg/dL (ref 0–75)
Hyaline Cast: 13
KETONE: NEGATIVE
Leukocyte Esterase: NEGATIVE
Nitrite: NEGATIVE
PH: 5 (ref 4.5–8.0)
PROTEIN: NEGATIVE
RBC, UR: NONE SEEN /HPF (ref 0–5)
SPECIFIC GRAVITY: 1.008 (ref 1.003–1.030)
SQUAMOUS EPITHELIAL: NONE SEEN
WBC UR: <1 /HPF (ref 0–5)

## 2014-10-08 LAB — POCT ERYTHROCYTE SEDIMENTATION RATE, NON-AUTOMATED: SED RATE: 66 mm

## 2014-10-08 LAB — SEDIMENTATION RATE: Erythrocyte Sed Rate: 66 mm/hr — ABNORMAL HIGH (ref 0–20)

## 2014-10-12 ENCOUNTER — Telehealth: Payer: Self-pay | Admitting: Internal Medicine

## 2014-10-12 MED ORDER — LACTULOSE 20 GM/30ML PO SOLN: 30.0000 mL | Freq: Four times a day (QID) | ORAL | Status: DC | PRN

## 2014-10-12 NOTE — Telephone Encounter (Signed)
Patient has been taking laxatives, furosemide and the Hctz. Patient is having severe constipation due to Oxycodone, and is having some nausea, not eating well.

## 2014-10-12 NOTE — Telephone Encounter (Signed)
Patient notified and voiced understanding repeated medication to stop and lab appointment made.

## 2014-10-12 NOTE — Telephone Encounter (Signed)
See note Dr. [name]

## 2014-10-12 NOTE — Telephone Encounter (Signed)
The patient has been cleared by Dr. [name] for surgery. Dr. Martha ClanKrasinski wants her to look at the labs that were done at [facility] . He is wanting her to check his Kidney functions.

## 2014-10-12 NOTE — Telephone Encounter (Signed)
Ed's recent renal function checked at the hospitla was off,  Please fid out if he has been taking any diuretics and NSAIDs.

## 2014-10-12 NOTE — Telephone Encounter (Signed)
Tell him to stop the hctz and the furosemide.  Immediately   He is very dehydrated, and he needs to take dulcolax 10 mg every night for the constipation  I will also call in a strong cathartic leaxative to take as well called lactulose but he needs to increase his water intake to 3 20 ounce servings daily and have a repeat kidney function test on Thursday here. im the office.

## 2014-10-13 ENCOUNTER — Other Ambulatory Visit: Payer: Self-pay | Admitting: *Deleted

## 2014-10-13 MED ORDER — LACTULOSE 20 GM/30ML PO SOLN: 30.0000 mL | Freq: Four times a day (QID) | ORAL | Status: DC | PRN

## 2014-10-13 NOTE — Progress Notes (Signed)
Resent lactulose to local pharmacy.

## 2014-10-15 ENCOUNTER — Telehealth: Payer: Self-pay | Admitting: *Deleted

## 2014-10-15 ENCOUNTER — Other Ambulatory Visit (INDEPENDENT_AMBULATORY_CARE_PROVIDER_SITE_OTHER): Payer: Medicare HMO

## 2014-10-15 DIAGNOSIS — N179 Acute kidney failure, unspecified: Secondary | ICD-10-CM

## 2014-10-15 NOTE — Telephone Encounter (Signed)
What labs and dx?  

## 2014-10-15 NOTE — Telephone Encounter (Signed)
Pt stopped by scales to check weight on the way to lab. Pt wanted to inform you that today's weight was 275lbs 8oz. Per last office visit, he was 290lbs 4oz on 09/07/14.

## 2014-10-16 LAB — BASIC METABOLIC PANEL
BUN: 53 mg/dL — ABNORMAL HIGH (ref 6–23)
CALCIUM: 9.8 mg/dL (ref 8.4–10.5)
CO2: 32 mEq/L (ref 19–32)
Chloride: 84 mEq/L — ABNORMAL LOW (ref 96–112)
Creatinine, Ser: 2.6 mg/dL — ABNORMAL HIGH (ref 0.4–1.5)
GFR: 26.57 mL/min — AB (ref >60.00)
Glucose, Bld: 111 mg/dL — ABNORMAL HIGH (ref 70–99)
Potassium: 3.4 mEq/L — ABNORMAL LOW (ref 3.5–5.1)
SODIUM: 130 meq/L — AB (ref 135–145)

## 2014-10-18 ENCOUNTER — Encounter: Payer: Self-pay | Admitting: Internal Medicine

## 2014-10-18 ENCOUNTER — Other Ambulatory Visit: Payer: Self-pay | Admitting: Internal Medicine

## 2014-10-18 DIAGNOSIS — N179 Acute kidney failure, unspecified: Secondary | ICD-10-CM

## 2014-10-19 ENCOUNTER — Telehealth: Payer: Self-pay | Admitting: Internal Medicine

## 2014-10-19 ENCOUNTER — Emergency Department: Payer: Self-pay | Admitting: Emergency Medicine

## 2014-10-19 LAB — CBC WITH DIFFERENTIAL/PLATELET
BASOS PCT: 1.2 %
Basophil #: 0.1 10*3/uL (ref 0.0–0.1)
EOS PCT: 3.9 %
Eosinophil #: 0.3 10*3/uL (ref 0.0–0.7)
HCT: 36.4 % — ABNORMAL LOW (ref 40.0–52.0)
HGB: 12.1 g/dL — AB (ref 13.0–18.0)
LYMPHS PCT: 20.8 %
Lymphocyte #: 1.5 10*3/uL (ref 1.0–3.6)
MCH: 30.3 pg (ref 26.0–34.0)
MCHC: 33.3 g/dL (ref 32.0–36.0)
MCV: 91 fL (ref 80–100)
Monocyte #: 0.7 x10 3/mm (ref 0.2–1.0)
Monocyte %: 10 %
Neutrophil #: 4.5 10*3/uL (ref 1.4–6.5)
Neutrophil %: 64.1 %
PLATELETS: 185 10*3/uL (ref 150–440)
RBC: 4 10*6/uL — ABNORMAL LOW (ref 4.40–5.90)
RDW: 13.3 % (ref 11.5–14.5)
WBC: 7 10*3/uL (ref 3.8–10.6)

## 2014-10-19 LAB — URINALYSIS, COMPLETE
Bacteria: NONE SEEN
Bilirubin,UR: NEGATIVE
Blood: NEGATIVE
GLUCOSE, UR: NEGATIVE mg/dL (ref 0–75)
Ketone: NEGATIVE
Leukocyte Esterase: NEGATIVE
Nitrite: NEGATIVE
PROTEIN: NEGATIVE
Ph: 7 (ref 4.5–8.0)
RBC,UR: NONE SEEN /HPF (ref 0–5)
Specific Gravity: 1.014 (ref 1.003–1.030)
Squamous Epithelial: NONE SEEN

## 2014-10-19 LAB — BASIC METABOLIC PANEL
Anion Gap: 7 (ref 7–16)
BUN: 30 mg/dL — ABNORMAL HIGH (ref 7–18)
Calcium, Total: 9.2 mg/dL (ref 8.5–10.1)
Chloride: 99 mmol/L (ref 98–107)
Co2: 35 mmol/L — ABNORMAL HIGH (ref 21–32)
Creatinine: 1.67 mg/dL — ABNORMAL HIGH (ref 0.60–1.30)
EGFR (African American): 53 — ABNORMAL LOW
GFR CALC NON AF AMER: 44 — AB
Glucose: 92 mg/dL (ref 65–99)
Osmolality: 287 (ref 275–301)
Potassium: 3.6 mmol/L (ref 3.5–5.1)
SODIUM: 141 mmol/L (ref 136–145)

## 2014-10-19 NOTE — Telephone Encounter (Signed)
Notified patient and patient is taking advice an going to ER notified ER by fax, Labs and OV notes faxed to ER.

## 2014-10-19 NOTE — Telephone Encounter (Signed)
Caller name:Hutch Relation to ZO:XWRUpt:self  Call back number:[phone] Pharmacy:  Reason for call:  Called patient to advise of ultrasound appointment.  He said he feels really bad and wondered if you had considered putting him in the hospital

## 2014-10-19 NOTE — Telephone Encounter (Signed)
I cannot direct admit a patient to the hospital ,  But given his kidney failure and feeling so poorly   If he went to the ER and told them his doctor said he was acute renal failure,  they would definitely admit him and the workup would be expedited.

## 2014-10-20 ENCOUNTER — Telehealth: Payer: Self-pay | Admitting: Internal Medicine

## 2014-10-20 NOTE — Telephone Encounter (Signed)
EPIC strikes again,  just typed 5 minute note about hsi ER visit and it is apparenlty lost into the EPIC ether  So here goes AGAIN:  Was not admitted from ER Kidney function much better but not at baseline yet.  Stay off the meds we have been holding and repeat BMET on Friday. Kidney looked OK on CT scan done in ER so no ultrasound needed. Will keep the nephrology referral just in case. If still constipated let me know.

## 2014-10-20 NOTE — Telephone Encounter (Signed)
Message copied by Sherlene ShamsULLO, Ross Hefferan L on Tue Oct 20, 2014  8:26 AM ------      Message from: Dennie BibleAVIS, KATHY R      Created: Mon Oct 19, 2014  1:30 PM       Patient has talked with Ortho and surgery is being rescheduled. Patient stated he is not taking the Naproxen. FYI ------

## 2014-10-20 NOTE — Telephone Encounter (Signed)
Patient aware and scheduled patient for BMET on Friday.

## 2014-10-22 ENCOUNTER — Other Ambulatory Visit: Payer: Medicare HMO

## 2014-10-22 ENCOUNTER — Telehealth: Payer: Self-pay

## 2014-10-22 NOTE — Telephone Encounter (Signed)
Received clearance request from [facility].  Pt is sched for rt total hip surgery on 10/22/14. Per Eula Listenyan Dunn, PA: 1.  Recent lexiscan myoview 09/02/14 w/ no significant ischemia, no wall motion abnormality, EF 67%, no EKG changes concerning for ischemia.  Overall low risk study. 2.  May stop aspirin 3-5 days prior, if needed.  Restart when possible.   Faxed to [phone].

## 2014-10-23 ENCOUNTER — Other Ambulatory Visit (INDEPENDENT_AMBULATORY_CARE_PROVIDER_SITE_OTHER): Payer: Medicare HMO

## 2014-10-23 DIAGNOSIS — N179 Acute kidney failure, unspecified: Secondary | ICD-10-CM

## 2014-10-23 LAB — RENAL FUNCTION PANEL
Albumin: 3 g/dL — ABNORMAL LOW (ref 3.5–5.2)
BUN: 16 mg/dL (ref 6–23)
CALCIUM: 9.4 mg/dL (ref 8.4–10.5)
CHLORIDE: 101 meq/L (ref 96–112)
CO2: 27 meq/L (ref 19–32)
Creatinine, Ser: 1.4 mg/dL (ref 0.4–1.5)
GFR: 51.85 mL/min — ABNORMAL LOW (ref >60.00)
Glucose, Bld: 101 mg/dL — ABNORMAL HIGH (ref 70–99)
POTASSIUM: 3.7 meq/L (ref 3.5–5.1)
Phosphorus: 3 mg/dL (ref 2.3–4.6)
SODIUM: 138 meq/L (ref 135–145)

## 2014-10-24 ENCOUNTER — Encounter: Payer: Self-pay | Admitting: Internal Medicine

## 2014-10-26 LAB — PTH, INTACT AND CALCIUM
Calcium: 8.6 mg/dL (ref 8.4–10.5)
PTH: 38 pg/mL (ref 14–64)

## 2014-10-27 ENCOUNTER — Telehealth: Payer: Self-pay

## 2014-10-27 LAB — PROTEIN ELECTROPHORESIS, SERUM
ALPHA-1-GLOBULIN: 6 % — AB (ref 2.9–4.9)
ALPHA-2-GLOBULIN: 13.4 % — AB (ref 7.1–11.8)
Albumin ELP: 51.5 % — ABNORMAL LOW (ref 55.8–66.1)
BETA GLOBULIN: 6.4 % (ref 4.7–7.2)
Beta 2: 6.2 % (ref 3.2–6.5)
Gamma Globulin: 16.5 % (ref 11.1–18.8)
Total Protein, Serum Electrophoresis: 5.6 g/dL — ABNORMAL LOW (ref 6.0–8.3)

## 2014-10-27 LAB — UIFE/LIGHT CHAINS/TP QN, 24-HR UR
ALPHA 2 UR: DETECTED — AB
Albumin, U: DETECTED
Alpha 1, Urine: DETECTED — AB
Beta, Urine: DETECTED — AB
GAMMA UR: DETECTED — AB
Total Protein, Urine: 34 mg/dL — ABNORMAL HIGH (ref 5–25)

## 2014-10-27 NOTE — Telephone Encounter (Signed)
Faxed letter to DR. Charm RingsK's office and notified his nurse Tabitha.

## 2014-10-27 NOTE — Telephone Encounter (Signed)
Placed letter in quick sign

## 2014-10-27 NOTE — Telephone Encounter (Signed)
Dr.Krasinski's office is calling, hoping to have a couple questions answered regarding the patient being cleared for surgery, even with his poor kidney function.   Callback - [phone]

## 2014-10-27 NOTE — Telephone Encounter (Signed)
Called Dr. Samuel GermanyKrasinski's office and left message for them to return call, not available at this time.

## 2014-10-27 NOTE — Telephone Encounter (Signed)
Letter printed .  Cleared for surgery

## 2014-10-27 NOTE — Telephone Encounter (Signed)
Dr. Kirtland BouchardK would like surgical clearance in writing and should they wait for Nephrology before putting him on schedule? Tabitha called for advice. Patient is wanting to go on with surgery and stated to Dr. Charm RingsK's office MD is clearing him for surgery Please advise.

## 2014-10-30 ENCOUNTER — Telehealth: Payer: Self-pay | Admitting: Internal Medicine

## 2014-10-30 MED ORDER — OXYCODONE HCL 15 MG PO TABS: 15.0000 mg | ORAL_TABLET | Freq: Three times a day (TID) | ORAL | Status: DC | PRN

## 2014-10-30 NOTE — Telephone Encounter (Signed)
Patient notified and script ready for pick up placed at front desk.

## 2014-10-30 NOTE — Telephone Encounter (Signed)
Patient calling for refill on Oxycodone 15 mg IR please advise ok to fill? Last fill 09/28/14

## 2014-10-30 NOTE — Telephone Encounter (Signed)
Ok to refill,  printed rx  

## 2014-10-30 NOTE — Telephone Encounter (Signed)
Refaxed form as requested to [place] .

## 2014-10-30 NOTE — Telephone Encounter (Signed)
:  The patient stated that Victor Daniels at [facility] sent you a message regarding the release for the patients surgery. Victor Daniels has not received a release.

## 2014-10-31 ENCOUNTER — Encounter: Payer: Self-pay | Admitting: Internal Medicine

## 2014-11-02 ENCOUNTER — Telehealth: Payer: Self-pay | Admitting: Internal Medicine

## 2014-11-02 NOTE — Telephone Encounter (Signed)
Victor Daniels called saying his hip operation has been moved up to Dec. 10th. He appreciates all that Dr. [name] has done and wanted to let her know. Thank you.

## 2014-11-02 NOTE — Telephone Encounter (Signed)
Olegario MessierKathy, please disregard the message about calling Dr Dario AveKraskinski ,  The surgery has been moved up

## 2014-11-03 ENCOUNTER — Telehealth: Payer: Self-pay | Admitting: Internal Medicine

## 2014-11-03 NOTE — Telephone Encounter (Signed)
Victor Daniels called wondering if Dr. [name] can get his surgery moved up even more. He said he realizes it's on Dec 10th but he can't imagine continuing with the pain that long. He said he's taking Oxycodone but doesn't want to become addicted to it. He's wondering if Dr. [name] can move up the surgery date even more. Please call the pt. Pt ph# [phone]. Thank you.

## 2014-11-03 NOTE — Telephone Encounter (Signed)
Spoke with Egyptabatha at [facility].  She states Dr Martha ClanKrasinski does not have an earlier surgical date.  She further states she actually asked another pt to move their surgery so that Mr Maryruth BunKirchgessner could be seen on 12.10.15.  Pt notified.

## 2014-11-03 NOTE — Telephone Encounter (Signed)
Please call Dr Samuel GermanyKrasinski's RN to see if his surgery can be moved up from Dec 10 tp ASAP

## 2014-11-03 NOTE — Telephone Encounter (Signed)
Please tell patient what Dr Samuel GermanyKrasinski;s RN said .

## 2014-11-04 ENCOUNTER — Telehealth: Payer: Self-pay | Admitting: Internal Medicine

## 2014-11-04 MED ORDER — AMLODIPINE BESYLATE 5 MG PO TABS: 5.0000 mg | ORAL_TABLET | Freq: Every day | ORAL | Status: DC

## 2014-11-04 NOTE — Telephone Encounter (Signed)
Patient Information:  Caller Name: Ramon Dredgedward  Phone: [phone]  Patient: Victor Daniels, Polk  Gender: Male  DOB: [birth year]  Age: 68 Years  PCP: Duncan Dullullo, Teresa (Adults only)  Office Follow Up:  Does the office need to follow up with this patient?: Yes  Instructions For The Office: Please review Kidney function lab studies. Please review Blood pressure medications.   Question regarding elevated blood pressure and starting medications again.  He goes tomorrow for Pre-Op for hip surgery and does not want pressure elevated. Please review and contact patient.  RN Note:  Please review Kidney function lab studies. Please review Blood pressure medications.   Question regarding elevated blood pressure and starting medications again.  He goes tomorrow for Pre-Op for hip surgery and does not want pressure elevated. Please review and contact patient.  Symptoms  Reason For Call & Symptoms: Patient is having a hip replacement November 18th, 2015.  He was taken diuretics because of his Kidney function. He stopped Lasix , HCTZ, lisinopril and Naprosyn.  BUN/Creating has improved . Labs 10/23/14.  He checked his blood pressure- 186/109 P 77.  He would like guidance from physician.  What should he do?  No CP , no shortness of breath, no headaches, no vision changes.  Reviewed Health History In EMR: Yes  Reviewed Medications In EMR: Yes  Reviewed Allergies In EMR: Yes  Reviewed Surgeries / Procedures: Yes  Date of Onset of Symptoms: 11/04/2014  Treatments Tried: Hip medication tid- oxycodone for hip pain.  Treatments Tried Worked: No  Guideline(s) Used:  High Blood Pressure  Disposition Per Guideline:   See Within 2 Weeks in Office  Reason For Disposition Reached:   BP > 160/100  Advice Given:  BP less than 120 / 80   This is considered normal blood pressure  Call Back If:  Headache, blurred vision, difficulty talking, or difficulty walking occurs  Chest pain or difficulty breathing occurs  You become worse.  RN Overrode Recommendation:  Document Patient  Please review Kidney function lab studies. Please review Blood pressure medications.   Question regarding elevated blood pressure and starting medications again.  He goes tomorrow for Pre-Op for hip surgery and does not want pressure elevated. Please review and contact patient.

## 2014-11-04 NOTE — Telephone Encounter (Signed)
Patient notified of medication change and will start immediately.

## 2014-11-04 NOTE — Telephone Encounter (Signed)
I have sent a new bp medication called amlodipine to his pharmacy to start today.  It wiill NOT affect his kidney function.  Take it once daily starting today and take it again tomorrow morning as soon as he wakes up so it is in effect when he goes for pre op. We can increase it in one week if needed for BP control, to 10 mg daily

## 2014-11-05 ENCOUNTER — Ambulatory Visit: Payer: Self-pay | Admitting: Orthopedic Surgery

## 2014-11-05 LAB — URINALYSIS, COMPLETE
BACTERIA: NONE SEEN
BLOOD: NEGATIVE
Bilirubin,UR: NEGATIVE
Glucose,UR: NEGATIVE mg/dL (ref 0–75)
Ketone: NEGATIVE
Leukocyte Esterase: NEGATIVE
NITRITE: NEGATIVE
Ph: 5 (ref 4.5–8.0)
Protein: NEGATIVE
RBC, UR: NONE SEEN /HPF (ref 0–5)
SPECIFIC GRAVITY: 1.016 (ref 1.003–1.030)

## 2014-11-05 LAB — BASIC METABOLIC PANEL
Anion Gap: 7 (ref 7–16)
BUN: 11 mg/dL (ref 7–18)
CALCIUM: 8.8 mg/dL (ref 8.5–10.1)
CREATININE: 1.11 mg/dL (ref 0.60–1.30)
Chloride: 106 mmol/L (ref 98–107)
Co2: 28 mmol/L (ref 21–32)
EGFR (Non-African Amer.): >60
Glucose: 86 mg/dL (ref 65–99)
Osmolality: 280 (ref 275–301)
Potassium: 4 mmol/L (ref 3.5–5.1)
Sodium: 141 mmol/L (ref 136–145)

## 2014-11-05 LAB — MRSA PCR SCREENING

## 2014-11-05 LAB — CBC
HCT: 34.4 % — ABNORMAL LOW (ref 40.0–52.0)
HGB: 11.2 g/dL — ABNORMAL LOW (ref 13.0–18.0)
MCH: 29.6 pg (ref 26.0–34.0)
MCHC: 32.5 g/dL (ref 32.0–36.0)
MCV: 91 fL (ref 80–100)
PLATELETS: 229 10*3/uL (ref 150–440)
RBC: 3.77 10*6/uL — ABNORMAL LOW (ref 4.40–5.90)
RDW: 13.6 % (ref 11.5–14.5)
WBC: 6.5 10*3/uL (ref 3.8–10.6)

## 2014-11-05 LAB — SEDIMENTATION RATE: ERYTHROCYTE SED RATE: 56 mm/h — AB (ref 0–20)

## 2014-11-05 LAB — APTT: ACTIVATED PTT: 36.2 s — AB (ref 23.6–35.9)

## 2014-11-05 LAB — PROTIME-INR
INR: 1.1
Prothrombin Time: 13.9 secs (ref 11.5–14.7)

## 2014-11-10 ENCOUNTER — Telehealth: Payer: Self-pay | Admitting: Cardiovascular Disease

## 2014-11-10 NOTE — Telephone Encounter (Signed)
Faxed over an EKG yesterday,  Pt had surgery tomorrow. Pt is having a Right total hip surgery. They wanted to know if pt can do surgery based upon the ekg.  Please call them ([facility]) .

## 2014-11-10 NOTE — Telephone Encounter (Signed)
The EKG is not scanned in for Dr. Mariah MillingGollan to review.

## 2014-11-10 NOTE — Telephone Encounter (Signed)
Attempted to call Mindi JunkerMarsha, but she was in-between locations.  Faxed Ryan's clearance consent to [facility].

## 2014-11-10 NOTE — Telephone Encounter (Signed)
All EKGs given to Eula Listenyan Dunn, PA for review.

## 2014-11-10 NOTE — Telephone Encounter (Signed)
Pre admit calling back, needs to know if pt is cleared for surgery. I asked Mindi JunkerMarsha to refax EKG, I will obtain and scan to triage. Fax [phone]

## 2014-11-11 ENCOUNTER — Inpatient Hospital Stay: Payer: Self-pay | Admitting: Orthopedic Surgery

## 2014-11-11 LAB — CBC WITH DIFFERENTIAL/PLATELET
BASOS PCT: 0.4 %
Basophil #: 0 10*3/uL (ref 0.0–0.1)
Eosinophil #: 0.1 10*3/uL (ref 0.0–0.7)
Eosinophil %: 1.3 %
HCT: 30.1 % — ABNORMAL LOW (ref 40.0–52.0)
HGB: 9.8 g/dL — ABNORMAL LOW (ref 13.0–18.0)
LYMPHS PCT: 10.5 %
Lymphocyte #: 0.8 10*3/uL — ABNORMAL LOW (ref 1.0–3.6)
MCH: 29.5 pg (ref 26.0–34.0)
MCHC: 32.7 g/dL (ref 32.0–36.0)
MCV: 90 fL (ref 80–100)
MONOS PCT: 10 %
Monocyte #: 0.8 x10 3/mm (ref 0.2–1.0)
Neutrophil #: 5.8 10*3/uL (ref 1.4–6.5)
Neutrophil %: 77.8 %
PLATELETS: 196 10*3/uL (ref 150–440)
RBC: 3.34 10*6/uL — AB (ref 4.40–5.90)
RDW: 13.7 % (ref 11.5–14.5)
WBC: 7.5 10*3/uL (ref 3.8–10.6)

## 2014-11-12 LAB — CBC WITH DIFFERENTIAL/PLATELET
BASOS ABS: 0 10*3/uL (ref 0.0–0.1)
Basophil %: 0.5 %
EOS ABS: 0 10*3/uL (ref 0.0–0.7)
Eosinophil %: 0.2 %
HCT: 28.6 % — ABNORMAL LOW (ref 40.0–52.0)
HGB: 9.4 g/dL — AB (ref 13.0–18.0)
LYMPHS ABS: 0.9 10*3/uL — AB (ref 1.0–3.6)
LYMPHS PCT: 15.6 %
MCH: 29.6 pg (ref 26.0–34.0)
MCHC: 32.9 g/dL (ref 32.0–36.0)
MCV: 90 fL (ref 80–100)
MONO ABS: 0.7 x10 3/mm (ref 0.2–1.0)
Monocyte %: 11.8 %
NEUTROS ABS: 4.4 10*3/uL (ref 1.4–6.5)
Neutrophil %: 71.9 %
Platelet: 184 10*3/uL (ref 150–440)
RBC: 3.19 10*6/uL — ABNORMAL LOW (ref 4.40–5.90)
RDW: 13.5 % (ref 11.5–14.5)
WBC: 6.1 10*3/uL (ref 3.8–10.6)

## 2014-11-12 LAB — BASIC METABOLIC PANEL
ANION GAP: 4 — AB (ref 7–16)
BUN: 15 mg/dL (ref 7–18)
CHLORIDE: 105 mmol/L (ref 98–107)
Calcium, Total: 7.9 mg/dL — ABNORMAL LOW (ref 8.5–10.1)
Co2: 29 mmol/L (ref 21–32)
Creatinine: 1.06 mg/dL (ref 0.60–1.30)
EGFR (African American): >60
EGFR (Non-African Amer.): >60
GLUCOSE: 123 mg/dL — AB (ref 65–99)
Osmolality: 278 (ref 275–301)
Potassium: 4.1 mmol/L (ref 3.5–5.1)
SODIUM: 138 mmol/L (ref 136–145)

## 2014-11-13 LAB — HEMOGLOBIN: HGB: 8.1 g/dL — ABNORMAL LOW (ref 13.0–18.0)

## 2014-11-14 LAB — CBC WITH DIFFERENTIAL/PLATELET
BASOS ABS: 0.1 10*3/uL (ref 0.0–0.1)
BASOS PCT: 0.6 %
Eosinophil #: 0.2 10*3/uL (ref 0.0–0.7)
Eosinophil %: 2 %
HCT: 25.6 % — ABNORMAL LOW (ref 40.0–52.0)
HGB: 8.5 g/dL — ABNORMAL LOW (ref 13.0–18.0)
Lymphocyte #: 1.4 10*3/uL (ref 1.0–3.6)
Lymphocyte %: 17.9 %
MCH: 29.6 pg (ref 26.0–34.0)
MCHC: 33.1 g/dL (ref 32.0–36.0)
MCV: 89 fL (ref 80–100)
MONOS PCT: 11.7 %
Monocyte #: 0.9 x10 3/mm (ref 0.2–1.0)
NEUTROS ABS: 5.3 10*3/uL (ref 1.4–6.5)
Neutrophil %: 67.8 %
PLATELETS: 183 10*3/uL (ref 150–440)
RBC: 2.87 10*6/uL — ABNORMAL LOW (ref 4.40–5.90)
RDW: 13.8 % (ref 11.5–14.5)
WBC: 7.9 10*3/uL (ref 3.8–10.6)

## 2014-11-14 LAB — BASIC METABOLIC PANEL
ANION GAP: 6 — AB (ref 7–16)
BUN: 13 mg/dL (ref 7–18)
CALCIUM: 8.3 mg/dL — AB (ref 8.5–10.1)
CO2: 30 mmol/L (ref 21–32)
Chloride: 101 mmol/L (ref 98–107)
Creatinine: 1.19 mg/dL (ref 0.60–1.30)
Glucose: 131 mg/dL — ABNORMAL HIGH (ref 65–99)
Osmolality: 276 (ref 275–301)
Potassium: 3.8 mmol/L (ref 3.5–5.1)
Sodium: 137 mmol/L (ref 136–145)

## 2014-12-11 ENCOUNTER — Ambulatory Visit (INDEPENDENT_AMBULATORY_CARE_PROVIDER_SITE_OTHER): Payer: Medicare HMO | Admitting: Internal Medicine

## 2014-12-11 ENCOUNTER — Encounter: Payer: Self-pay | Admitting: Internal Medicine

## 2014-12-11 VITALS — BP 142/78 | HR 88 | Temp 98.6°F | Resp 16 | Wt 269.5 lb

## 2014-12-11 DIAGNOSIS — M1612 Unilateral primary osteoarthritis, left hip: Secondary | ICD-10-CM

## 2014-12-11 DIAGNOSIS — Z9660 Presence of unspecified orthopedic joint implant: Secondary | ICD-10-CM

## 2014-12-11 DIAGNOSIS — J441 Chronic obstructive pulmonary disease with (acute) exacerbation: Secondary | ICD-10-CM

## 2014-12-11 DIAGNOSIS — Z96649 Presence of unspecified artificial hip joint: Secondary | ICD-10-CM

## 2014-12-11 MED ORDER — DOXYCYCLINE HYCLATE 100 MG PO CAPS: 100.0000 mg | ORAL_CAPSULE | Freq: Two times a day (BID) | ORAL | Status: DC

## 2014-12-11 MED ORDER — PREDNISONE (PAK) 10 MG PO TABS: ORAL_TABLET | ORAL | Status: DC

## 2014-12-11 NOTE — Progress Notes (Signed)
Pre visit review using our clinic review tool, if applicable. No additional management support is needed unless otherwise documented below in the visit note. 

## 2014-12-11 NOTE — Patient Instructions (Signed)
I am treating your for a COPD exacerbation :  Prednisone taper for 6 days  Doxycycline 100 mg twice daily 7 days TAKE WITH FOOD   Continue your inhalers  Use your albuterol inhaler as needed
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
## 2014-12-12 DIAGNOSIS — Z96649 Presence of unspecified artificial hip joint: Secondary | ICD-10-CM | POA: Insufficient documentation

## 2014-12-12 DIAGNOSIS — J441 Chronic obstructive pulmonary disease with (acute) exacerbation: Secondary | ICD-10-CM | POA: Insufficient documentation

## 2014-12-12 NOTE — Assessment & Plan Note (Signed)
Current presentation suggest viral etiology. However he has COPD.  Will treat supportively,  Advised to start the antibiotic and prednisone taper

## 2014-12-12 NOTE — Assessment & Plan Note (Signed)
[facility] Dr[facility] Murlean CallerKevin Krasinksi.  He is not taking narcotics by choice.

## 2014-12-14 ENCOUNTER — Other Ambulatory Visit: Payer: Self-pay | Admitting: Internal Medicine

## 2014-12-14 ENCOUNTER — Other Ambulatory Visit: Payer: Self-pay | Admitting: *Deleted

## 2014-12-14 ENCOUNTER — Telehealth: Payer: Self-pay | Admitting: Internal Medicine

## 2014-12-14 MED ORDER — LEVOFLOXACIN 500 MG PO TABS: 500.0000 mg | ORAL_TABLET | Freq: Every day | ORAL | Status: DC

## 2014-12-14 NOTE — Telephone Encounter (Signed)
Mr. Victor Daniels called saying he saw Dr. [name] on Friday and was given medication but was also told if he's not feeling better by Monday to call back. He said he's not feeling any better and actually still "feels like crap." He has the same symptoms: body aches, cough, chest cold, etc and has taken half of the medication that was prescribed to him. He's wondering if he needs to come back in or if something else can be prescribed for him. Please call the pt. Pt ph# [phone] Thank you.

## 2014-12-14 NOTE — Progress Notes (Signed)
Pt notified & Rx already sent in

## 2014-12-14 NOTE — Progress Notes (Unsigned)
Called in different antibiotic for him to take,  Instead of doxycycline,  Can start levaquin .

## 2014-12-19 ENCOUNTER — Encounter: Payer: Self-pay | Admitting: Internal Medicine

## 2014-12-21 MED ORDER — FLUTICASONE-SALMETEROL 500-50 MCG/DOSE IN AEPB: 1.0000 | INHALATION_SPRAY | Freq: Two times a day (BID) | RESPIRATORY_TRACT | Status: DC

## 2014-12-21 MED ORDER — ALBUTEROL SULFATE HFA 108 (90 BASE) MCG/ACT IN AERS: INHALATION_SPRAY | RESPIRATORY_TRACT | Status: DC

## 2014-12-21 NOTE — Telephone Encounter (Signed)
Refills sent. Please advise in Dr. Melina Schoolsullo's absence, thanks

## 2014-12-24 ENCOUNTER — Ambulatory Visit (INDEPENDENT_AMBULATORY_CARE_PROVIDER_SITE_OTHER)
Admission: RE | Admit: 2014-12-24 | Discharge: 2014-12-24 | Disposition: A | Discharge status: Home / Self Care | Payer: Commercial Managed Care - HMO | Source: Ambulatory Visit | Attending: Internal Medicine | Admitting: Internal Medicine

## 2014-12-24 ENCOUNTER — Encounter: Payer: Self-pay | Admitting: Internal Medicine

## 2014-12-24 ENCOUNTER — Ambulatory Visit (INDEPENDENT_AMBULATORY_CARE_PROVIDER_SITE_OTHER): Payer: Medicare HMO | Admitting: Internal Medicine

## 2014-12-24 VITALS — BP 128/76 | HR 82 | Temp 98.5°F | Resp 16 | Ht 72.0 in | Wt 265.2 lb

## 2014-12-24 DIAGNOSIS — R0600 Dyspnea, unspecified: Secondary | ICD-10-CM

## 2014-12-24 DIAGNOSIS — R0602 Shortness of breath: Secondary | ICD-10-CM

## 2014-12-24 DIAGNOSIS — J441 Chronic obstructive pulmonary disease with (acute) exacerbation: Secondary | ICD-10-CM

## 2014-12-24 DIAGNOSIS — R5383 Other fatigue: Secondary | ICD-10-CM

## 2014-12-24 LAB — COMPREHENSIVE METABOLIC PANEL
ALBUMIN: 3.4 g/dL — AB (ref 3.5–5.2)
ALK PHOS: 71 U/L (ref 39–117)
ALT: 10 U/L (ref 0–53)
AST: 13 U/L (ref 0–37)
BUN: 12 mg/dL (ref 6–23)
CALCIUM: 9.2 mg/dL (ref 8.4–10.5)
CHLORIDE: 102 meq/L (ref 96–112)
CO2: 30 mEq/L (ref 19–32)
Creatinine, Ser: 1 mg/dL (ref 0.4–1.5)
GFR: 77.15 mL/min (ref >60.00)
Glucose, Bld: 99 mg/dL (ref 70–99)
Potassium: 4.5 mEq/L (ref 3.5–5.1)
SODIUM: 137 meq/L (ref 135–145)
TOTAL PROTEIN: 6.4 g/dL (ref 6.0–8.3)
Total Bilirubin: 0.5 mg/dL (ref 0.2–1.2)

## 2014-12-24 LAB — CBC WITH DIFFERENTIAL/PLATELET
BASOS ABS: 0.1 10*3/uL (ref 0.0–0.1)
Basophils Relative: 0.7 % (ref 0.0–3.0)
EOS ABS: 0.4 10*3/uL (ref 0.0–0.7)
EOS PCT: 4.9 % (ref 0.0–5.0)
HEMATOCRIT: 33.7 % — AB (ref 39.0–52.0)
Hemoglobin: 10.6 g/dL — ABNORMAL LOW (ref 13.0–17.0)
LYMPHS ABS: 1.7 10*3/uL (ref 0.7–4.0)
LYMPHS PCT: 20.1 % (ref 12.0–46.0)
MCHC: 31.5 g/dL (ref 30.0–36.0)
MCV: 83.4 fl (ref 78.0–100.0)
MONOS PCT: 6.5 % (ref 3.0–12.0)
Monocytes Absolute: 0.5 10*3/uL (ref 0.1–1.0)
Neutro Abs: 5.6 10*3/uL (ref 1.4–7.7)
Neutrophils Relative %: 67.8 % (ref 43.0–77.0)
Platelets: 317 10*3/uL (ref 150.0–400.0)
RBC: 4.03 Mil/uL — ABNORMAL LOW (ref 4.22–5.81)
RDW: 16.2 % — AB (ref 11.5–15.5)
WBC: 8.3 10*3/uL (ref 4.0–10.5)

## 2014-12-24 LAB — BRAIN NATRIURETIC PEPTIDE: Pro B Natriuretic peptide (BNP): 44 pg/mL (ref 0.0–100.0)

## 2014-12-24 LAB — TSH: TSH: 1.67 u[IU]/mL (ref 0.35–4.50)

## 2014-12-24 MED ORDER — ALBUTEROL SULFATE HFA 108 (90 BASE) MCG/ACT IN AERS: INHALATION_SPRAY | RESPIRATORY_TRACT | Status: DC

## 2014-12-24 MED ORDER — OXYCODONE HCL 15 MG PO TABS: 15.0000 mg | ORAL_TABLET | Freq: Three times a day (TID) | ORAL | Status: DC | PRN

## 2014-12-24 MED ORDER — METHYLPREDNISOLONE ACETATE 80 MG/ML IJ SUSP
40.0000 mg | Freq: Once | INTRAMUSCULAR | Status: AC
  Administered 2014-12-24: 40 mg via INTRAMUSCULAR

## 2014-12-24 MED ORDER — FLUTICASONE-SALMETEROL 500-50 MCG/DOSE IN AEPB: 1.0000 | INHALATION_SPRAY | Freq: Two times a day (BID) | RESPIRATORY_TRACT | Status: DC

## 2014-12-24 MED ORDER — BENZONATATE 200 MG PO CAPS: 200.0000 mg | ORAL_CAPSULE | Freq: Three times a day (TID) | ORAL | Status: DC | PRN

## 2014-12-24 MED ORDER — PREDNISONE (PAK) 10 MG PO TABS: ORAL_TABLET | ORAL | Status: DC

## 2014-12-24 NOTE — Patient Instructions (Signed)
I am continuining the prednisone for another week and will add tessalon perles for the cough  You may end up on another antibiotic if the chest  Xray suggests you have a pneumonia   Your fatigue may be due to worsening sleep apnea,  Which also  puts a strain on your heart,  But I am ruling out anemia and thyroid problems today

## 2014-12-24 NOTE — Progress Notes (Signed)
Pre-visit discussion using our clinic review tool. No additional management support is needed unless otherwise documented below in the visit note.  
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
## 2014-12-25 ENCOUNTER — Encounter: Payer: Self-pay | Admitting: Internal Medicine

## 2014-12-26 ENCOUNTER — Encounter: Payer: Self-pay | Admitting: Internal Medicine

## 2014-12-26 NOTE — Assessment & Plan Note (Addendum)
He has finished a 7 day course of antibiotics, first doxy for 3 days,  Now finishing Levaquin.  CBC is normal, and CX suggests resolving PNA vs atelectasis.  Given his lack of fevers and purulent sputum, will avoid additional antibiotics,  But repeat the prednisolone taper and add a cough suppressant.   Lab Results  Component Value Date   WBC 8.3 12/24/2014   HGB 10.6* 12/24/2014   HCT 33.7* 12/24/2014   MCV 83.4 12/24/2014   PLT 317.0 12/24/2014

## 2014-12-26 NOTE — Assessment & Plan Note (Signed)
BNP was checked to rule out concurrent CHf,  And was < 100.

## 2014-12-28 ENCOUNTER — Telehealth: Payer: Self-pay | Admitting: Internal Medicine

## 2014-12-28 DIAGNOSIS — K521 Toxic gastroenteritis and colitis: Secondary | ICD-10-CM

## 2014-12-28 DIAGNOSIS — T3695XA Adverse effect of unspecified systemic antibiotic, initial encounter: Principal | ICD-10-CM

## 2014-12-28 NOTE — Telephone Encounter (Signed)
Needing a follow up appointment this week late morning or early afternoon.

## 2014-12-29 NOTE — Telephone Encounter (Signed)
If his sputum is clear or white and he is not having documented fevers,  Does not need another round of antibiotics.  Have him add sudafed PE 10 mg every 6 hours for the congestion . If he is having diarrhe he needs to drop off a stool sample for c dif.  Encourage him to start taking a probiotic daily .  c dif ordered.

## 2014-12-29 NOTE — Telephone Encounter (Signed)
Patient stated that he took lactulose on 12/28/14 that is when about 30 minutes later he had a large loose stool but has not had one since, nurse had questioned patient to this earlier and denied taking anything but stool softener, patient was advised Md order test for C-diff and of OTC medication for congestion, nurse did advise patient lactulose can cause very large loose stools.

## 2014-12-29 NOTE — Telephone Encounter (Signed)
Patient scheduled but stated things are the same and he is not feeling any better than before, still having constipation with bouts of diarrhea and just general malaise, patient C/O congestion and cough, not really changed from last Visit.

## 2015-01-01 DIAGNOSIS — M19011 Primary osteoarthritis, right shoulder: Secondary | ICD-10-CM | POA: Diagnosis not present

## 2015-01-01 DIAGNOSIS — Z96641 Presence of right artificial hip joint: Secondary | ICD-10-CM | POA: Diagnosis not present

## 2015-01-11 ENCOUNTER — Ambulatory Visit (INDEPENDENT_AMBULATORY_CARE_PROVIDER_SITE_OTHER): Payer: Commercial Managed Care - HMO | Admitting: Internal Medicine

## 2015-01-11 ENCOUNTER — Encounter: Payer: Self-pay | Admitting: Internal Medicine

## 2015-01-11 VITALS — BP 140/78 | HR 86 | Temp 98.0°F | Resp 16 | Ht 72.0 in | Wt 264.8 lb

## 2015-01-11 DIAGNOSIS — J441 Chronic obstructive pulmonary disease with (acute) exacerbation: Secondary | ICD-10-CM

## 2015-01-11 DIAGNOSIS — E785 Hyperlipidemia, unspecified: Secondary | ICD-10-CM

## 2015-01-11 DIAGNOSIS — D510 Vitamin B12 deficiency anemia due to intrinsic factor deficiency: Secondary | ICD-10-CM

## 2015-01-11 DIAGNOSIS — D509 Iron deficiency anemia, unspecified: Secondary | ICD-10-CM | POA: Diagnosis not present

## 2015-01-11 MED ORDER — BENZONATATE 200 MG PO CAPS: 200.0000 mg | ORAL_CAPSULE | Freq: Three times a day (TID) | ORAL | Status: DC | PRN

## 2015-01-11 NOTE — Patient Instructions (Signed)
Your lung exam is improving, but I recommend refraining from any outdoor activities (like raking leaves!) for another 10 days to let your lungs recover from the pneumonia  I am checking your hemoglobin today , along with iron, B12 and folate to see if the problem is due to a deficiency  Continue your current medications and continue deep breathing exercises to clear your lungs

## 2015-01-11 NOTE — Progress Notes (Signed)
Pre-visit discussion using our clinic review tool. No additional management support is needed unless otherwise documented below in the visit note.  
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
## 2015-01-12 ENCOUNTER — Encounter: Payer: Self-pay | Admitting: Internal Medicine

## 2015-01-12 LAB — CBC WITH DIFFERENTIAL/PLATELET
BASOS ABS: 0 10*3/uL (ref 0.0–0.1)
Basophils Relative: 0.5 % (ref 0.0–3.0)
EOS ABS: 0.4 10*3/uL (ref 0.0–0.7)
Eosinophils Relative: 5.1 % — ABNORMAL HIGH (ref 0.0–5.0)
HCT: 36.9 % — ABNORMAL LOW (ref 39.0–52.0)
Hemoglobin: 11.7 g/dL — ABNORMAL LOW (ref 13.0–17.0)
Lymphocytes Relative: 23.7 % (ref 12.0–46.0)
Lymphs Abs: 1.8 10*3/uL (ref 0.7–4.0)
MCHC: 31.8 g/dL (ref 30.0–36.0)
MCV: 82 fl (ref 78.0–100.0)
MONO ABS: 0.4 10*3/uL (ref 0.1–1.0)
Monocytes Relative: 5 % (ref 3.0–12.0)
NEUTROS ABS: 5 10*3/uL (ref 1.4–7.7)
Neutrophils Relative %: 65.7 % (ref 43.0–77.0)
Platelets: 209 10*3/uL (ref 150.0–400.0)
RBC: 4.5 Mil/uL (ref 4.22–5.81)
RDW: 16.5 % — ABNORMAL HIGH (ref 11.5–15.5)
WBC: 7.6 10*3/uL (ref 4.0–10.5)

## 2015-01-12 LAB — VITAMIN B12: VITAMIN B 12: 208 pg/mL — AB (ref 211–911)

## 2015-01-12 LAB — FERRITIN: Ferritin: 52.1 ng/mL (ref 22.0–322.0)

## 2015-01-12 LAB — IRON AND TIBC
%SAT: 16 % — ABNORMAL LOW (ref 20–55)
IRON: 40 ug/dL — AB (ref 42–165)
TIBC: 250 ug/dL (ref 215–435)
UIBC: 210 ug/dL (ref 125–400)

## 2015-01-13 LAB — FOLATE RBC: RBC FOLATE: 1027 ng/mL (ref >280)

## 2015-01-14 DIAGNOSIS — D510 Vitamin B12 deficiency anemia due to intrinsic factor deficiency: Secondary | ICD-10-CM | POA: Insufficient documentation

## 2015-01-14 NOTE — Assessment & Plan Note (Addendum)
Symptoms have finally resolved after second round of prednisone and antibiotic for chest x ray concerning for lobar pneumonia. Victor Daniels.  He is no longer wheezing and has no evidence of antibiotic associated diarrhea.  Continue maintenance inhaled therapy, and refrain from outdoor activities for two weeks.

## 2015-01-14 NOTE — Assessment & Plan Note (Addendum)
Repeat assessment of recent drop in hemogloboin noted two weeks ago notes b12 deficiency. Dietary llikely,  But will start injections.   Lab Results  Component Value Date   VITAMINB12 208* 01/11/2015   Lab Results  Component Value Date   WBC 7.6 01/11/2015   HGB 11.7* 01/11/2015   HCT 36.9* 01/11/2015   MCV 82.0 01/11/2015   PLT 209.0 01/11/2015

## 2015-01-19 ENCOUNTER — Ambulatory Visit (INDEPENDENT_AMBULATORY_CARE_PROVIDER_SITE_OTHER): Payer: Commercial Managed Care - HMO | Admitting: *Deleted

## 2015-01-19 DIAGNOSIS — E538 Deficiency of other specified B group vitamins: Secondary | ICD-10-CM

## 2015-01-19 MED ORDER — CYANOCOBALAMIN 1000 MCG/ML IJ SOLN
1000.0000 ug | Freq: Once | INTRAMUSCULAR | Status: AC
  Administered 2015-01-19: 1000 ug via INTRAMUSCULAR

## 2015-01-26 ENCOUNTER — Ambulatory Visit (INDEPENDENT_AMBULATORY_CARE_PROVIDER_SITE_OTHER): Payer: Commercial Managed Care - HMO | Admitting: *Deleted

## 2015-01-26 DIAGNOSIS — E538 Deficiency of other specified B group vitamins: Secondary | ICD-10-CM

## 2015-01-26 MED ORDER — CYANOCOBALAMIN 1000 MCG/ML IJ SOLN
1000.0000 ug | Freq: Once | INTRAMUSCULAR | Status: AC
  Administered 2015-01-26: 1000 ug via INTRAMUSCULAR

## 2015-02-02 ENCOUNTER — Ambulatory Visit (INDEPENDENT_AMBULATORY_CARE_PROVIDER_SITE_OTHER): Payer: Commercial Managed Care - HMO | Admitting: *Deleted

## 2015-02-02 DIAGNOSIS — E538 Deficiency of other specified B group vitamins: Secondary | ICD-10-CM | POA: Diagnosis not present

## 2015-02-02 MED ORDER — CYANOCOBALAMIN 1000 MCG/ML IJ SOLN
1000.0000 ug | Freq: Once | INTRAMUSCULAR | Status: AC
  Administered 2015-02-02: 1000 ug via INTRAMUSCULAR

## 2015-02-09 ENCOUNTER — Ambulatory Visit (INDEPENDENT_AMBULATORY_CARE_PROVIDER_SITE_OTHER): Payer: Commercial Managed Care - HMO | Admitting: *Deleted

## 2015-02-09 DIAGNOSIS — E538 Deficiency of other specified B group vitamins: Secondary | ICD-10-CM | POA: Diagnosis not present

## 2015-02-09 MED ORDER — CYANOCOBALAMIN 1000 MCG/ML IJ SOLN
1000.0000 ug | Freq: Once | INTRAMUSCULAR | Status: AC
  Administered 2015-02-09: 1000 ug via INTRAMUSCULAR

## 2015-02-23 ENCOUNTER — Encounter: Payer: Self-pay | Admitting: Internal Medicine

## 2015-02-23 ENCOUNTER — Ambulatory Visit: Payer: Commercial Managed Care - HMO

## 2015-02-23 ENCOUNTER — Ambulatory Visit (INDEPENDENT_AMBULATORY_CARE_PROVIDER_SITE_OTHER): Payer: Commercial Managed Care - HMO | Admitting: Internal Medicine

## 2015-02-23 VITALS — BP 146/88 | HR 81 | Temp 98.7°F | Resp 16 | Ht 74.0 in | Wt 271.5 lb

## 2015-02-23 DIAGNOSIS — R059 Cough, unspecified: Secondary | ICD-10-CM

## 2015-02-23 DIAGNOSIS — R918 Other nonspecific abnormal finding of lung field: Secondary | ICD-10-CM

## 2015-02-23 DIAGNOSIS — R05 Cough: Secondary | ICD-10-CM | POA: Diagnosis not present

## 2015-02-23 DIAGNOSIS — I10 Essential (primary) hypertension: Secondary | ICD-10-CM

## 2015-02-23 DIAGNOSIS — R5383 Other fatigue: Secondary | ICD-10-CM

## 2015-02-23 DIAGNOSIS — D509 Iron deficiency anemia, unspecified: Secondary | ICD-10-CM

## 2015-02-23 DIAGNOSIS — R351 Nocturia: Secondary | ICD-10-CM
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
## 2015-02-23 NOTE — Patient Instructions (Signed)
Iron Deficiency Anemia Anemia is a condition in which there are less red blood cells or hemoglobin in the blood than normal. Hemoglobin is the part of red blood cells that carries oxygen. Iron deficiency anemia is anemia caused by too little iron. It is the most common type of anemia. It may leave you tired and short of breath. CAUSES   Lack of iron in the diet.  Poor absorption of iron, as seen with intestinal disorders.  Intestinal bleeding.  Heavy periods. SIGNS AND SYMPTOMS  Mild anemia may not be noticeable. Symptoms may include:  Fatigue.  Headache.  Pale skin.  Weakness.  Tiredness.  Shortness of breath.  Dizziness.  Cold hands and feet.  Fast or irregular heartbeat. DIAGNOSIS  Diagnosis requires a thorough evaluation and physical exam by your health care provider. Blood tests are generally used to confirm iron deficiency anemia. Additional tests may be done to find the underlying cause of your anemia. These may include:  Testing for blood in the stool (fecal occult blood test).  A procedure to see inside the colon and rectum (colonoscopy).  A procedure to see inside the esophagus and stomach (endoscopy). TREATMENT  Iron deficiency anemia is treated by correcting the cause of the deficiency. Treatment may involve:  Adding iron-rich foods to your diet.  Taking iron supplements. Pregnant or breastfeeding women need to take extra iron because their normal diet usually does not provide the required amount.  Taking vitamins. Vitamin C improves the absorption of iron. Your health care provider may recommend that you take your iron tablets with a glass of orange juice or vitamin C supplement.  Medicines to make heavy menstrual flow lighter.  Surgery. HOME CARE INSTRUCTIONS   Take iron as directed by your health care provider.  If you cannot tolerate taking iron supplements by mouth, talk to your health care provider about taking them through a vein  (intravenously) or an injection into a muscle.  For the best iron absorption, iron supplements should be taken on an empty stomach. If you cannot tolerate them on an empty stomach, you may need to take them with food.  Do not drink milk or take antacids at the same time as your iron supplements. Milk and antacids may interfere with the absorption of iron.  Iron supplements can cause constipation. Make sure to include fiber in your diet to prevent constipation. A stool softener may also be recommended.  Take vitamins as directed by your health care provider.  Eat a diet rich in iron. Foods high in iron include liver, lean beef, whole-grain bread, eggs, dried fruit, and dark green leafy vegetables. SEEK IMMEDIATE MEDICAL CARE IF:   You faint. If this happens, do not drive. Call your local emergency services (911 in [place] if no other help is available.  You have chest pain.  You feel nauseous or vomit.  You have severe or increased shortness of breath with activity.  You feel weak.  You have a rapid heartbeat.  You have unexplained sweating.  You become light-headed when getting up from a chair or bed. MAKE SURE YOU:   Understand these instructions.  Will watch your condition.  Will get help right away if you are not doing well or get worse. Document Released: 12/08/2000 Document Revised: 12/16/2013 Document Reviewed: 08/18/2013 [employer] Patient Information 2015 [employer]. This information is not intended to replace advice given to you by your health care provider. Make sure you discuss any questions you have with your health care provider.  

## 2015-02-23 NOTE — Progress Notes (Signed)
Pre-visit discussion using our clinic review tool. No additional management support is needed unless otherwise documented below in the visit note.  

## 2015-02-25 ENCOUNTER — Encounter: Payer: Self-pay | Admitting: Internal Medicine

## 2015-02-25 DIAGNOSIS — D509 Iron deficiency anemia, unspecified: Secondary | ICD-10-CM | POA: Insufficient documentation

## 2015-02-25 DIAGNOSIS — R5383 Other fatigue: Secondary | ICD-10-CM | POA: Insufficient documentation

## 2015-02-25 DIAGNOSIS — R531 Weakness: Secondary | ICD-10-CM | POA: Insufficient documentation

## 2015-02-25 DIAGNOSIS — R351 Nocturia: Secondary | ICD-10-CM | POA: Insufficient documentation

## 2015-02-25 NOTE — Assessment & Plan Note (Signed)
Well controlled on current regimen. Renal function stable, no changes today.  Lab Results  Component Value Date   CREATININE 1.0 12/24/2014   Lab Results  Component Value Date   NA 137 12/24/2014   K 4.5 12/24/2014   CL 102 12/24/2014   CO2 30 12/24/2014

## 2015-02-25 NOTE — Assessment & Plan Note (Signed)
Chronic x 6, requesting referral to Urology

## 2015-02-25 NOTE — Assessment & Plan Note (Signed)
Likely multifactorial ,  From b12 deficiency,  Untreated OSA, and sedentary lifestyle more recently. Will reassess after his b12 deficiency has been addressed

## 2015-02-25 NOTE — Assessment & Plan Note (Signed)
MILD, with normal TIBC, low iron saturation.  Will recommend once daily ferrous sulfate

## 2015-03-05 ENCOUNTER — Ambulatory Visit: Payer: Self-pay | Admitting: Internal Medicine

## 2015-03-05 DIAGNOSIS — J449 Chronic obstructive pulmonary disease, unspecified: Secondary | ICD-10-CM | POA: Diagnosis not present

## 2015-03-05 DIAGNOSIS — I2510 Atherosclerotic heart disease of native coronary artery without angina pectoris: Secondary | ICD-10-CM | POA: Diagnosis not present

## 2015-03-09 ENCOUNTER — Telehealth: Payer: Self-pay | Admitting: Internal Medicine

## 2015-03-09 DIAGNOSIS — J449 Chronic obstructive pulmonary disease, unspecified: Secondary | ICD-10-CM

## 2015-03-09 NOTE — Assessment & Plan Note (Signed)
CT showed stable lung volumes,  peribronchial thickening (which is a sign of inflammation) and dependent secretions in the carina and right mainstem bronchus (persistent mucous production) . These would account for any persistent cough. You need to resume Advair twice daily and add combivent inhaler every 6 hours to help dry up the secretions .  If the advair is too $$ or if you are already currently using it,  i will add a 6 day prednisone taper.

## 2015-03-09 NOTE — Telephone Encounter (Signed)
Your CT showed stable lung volumes,  peribronchial thickening (which is a sign of inflammation) and dependent secretions in the carina and right mainstem bronchus (persistent mucous production) . These would account for any persistent cough. You need to resume Advair twice daily and add combivent inhaler every 6 hours to help dry up the secretions .  If the advair is too $$ or if you are already currently using it,  i will add a 6 day prednisone taper.

## 2015-03-12 ENCOUNTER — Ambulatory Visit: Payer: Medicare HMO | Admitting: Internal Medicine

## 2015-03-24 ENCOUNTER — Ambulatory Visit (INDEPENDENT_AMBULATORY_CARE_PROVIDER_SITE_OTHER): Payer: Commercial Managed Care - HMO | Admitting: Internal Medicine

## 2015-03-24 ENCOUNTER — Encounter: Payer: Self-pay | Admitting: Internal Medicine

## 2015-03-24 ENCOUNTER — Telehealth: Payer: Self-pay | Admitting: Internal Medicine

## 2015-03-24 VITALS — BP 164/96 | HR 79 | Temp 98.0°F | Resp 16 | Ht 74.0 in | Wt 277.2 lb

## 2015-03-24 DIAGNOSIS — D692 Other nonthrombocytopenic purpura: Secondary | ICD-10-CM

## 2015-03-24 DIAGNOSIS — I10 Essential (primary) hypertension: Secondary | ICD-10-CM | POA: Diagnosis not present

## 2015-03-24 DIAGNOSIS — M4722 Other spondylosis with radiculopathy, cervical region: Secondary | ICD-10-CM | POA: Insufficient documentation

## 2015-03-24 DIAGNOSIS — M542 Cervicalgia: Secondary | ICD-10-CM | POA: Diagnosis not present

## 2015-03-24 DIAGNOSIS — J42 Unspecified chronic bronchitis: Secondary | ICD-10-CM | POA: Diagnosis not present

## 2015-03-24 MED ORDER — OXYCODONE HCL 15 MG PO TABS: 15.0000 mg | ORAL_TABLET | Freq: Three times a day (TID) | ORAL | Status: DC | PRN

## 2015-03-24 MED ORDER — TIOTROPIUM BROMIDE MONOHYDRATE 2.5 MCG/ACT IN AERS: 2.0000 | INHALATION_SPRAY | Freq: Every day | RESPIRATORY_TRACT | Status: DC

## 2015-03-24 MED ORDER — TAMSULOSIN HCL 0.4 MG PO CAPS: 0.4000 mg | ORAL_CAPSULE | Freq: Every day | ORAL | Status: DC

## 2015-03-24 NOTE — Progress Notes (Signed)
Pre-visit discussion using our clinic review tool. No additional management support is needed unless otherwise documented below in the visit note.  
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
## 2015-03-24 NOTE — Telephone Encounter (Signed)
emmi emailed °

## 2015-03-24 NOTE — Assessment & Plan Note (Addendum)
Elevated secondary to rapaflo. Dc'd,  Flomax prescribed for BPH w ith LUTs issues

## 2015-03-24 NOTE — Patient Instructions (Signed)
I recommend Dr Danford BadVan Dre  For general dentistry and [facility] for [facility]   Your numbness is coming from  The arthritis and herniated disks in your neck.  If it gets worse, please let me know.  i have added spiriva inhlaer to help your cough,  If it is too $$$$ let me know

## 2015-03-24 NOTE — Assessment & Plan Note (Addendum)
No improvement,  adding spiriva  To steroid /LABA inhaler.  He does not want o to see pulmonology

## 2015-03-25 IMAGING — CR PELVIS - 1-2 VIEW
1 series · 1 of 1 positions shown · non-contrast
Comparison: 04/03/2014 and 11/11/2014 earlier time

CLINICAL DATA: Postop right hip replacement.

EXAM:
PELVIS - 1-2 VIEW

[ap]
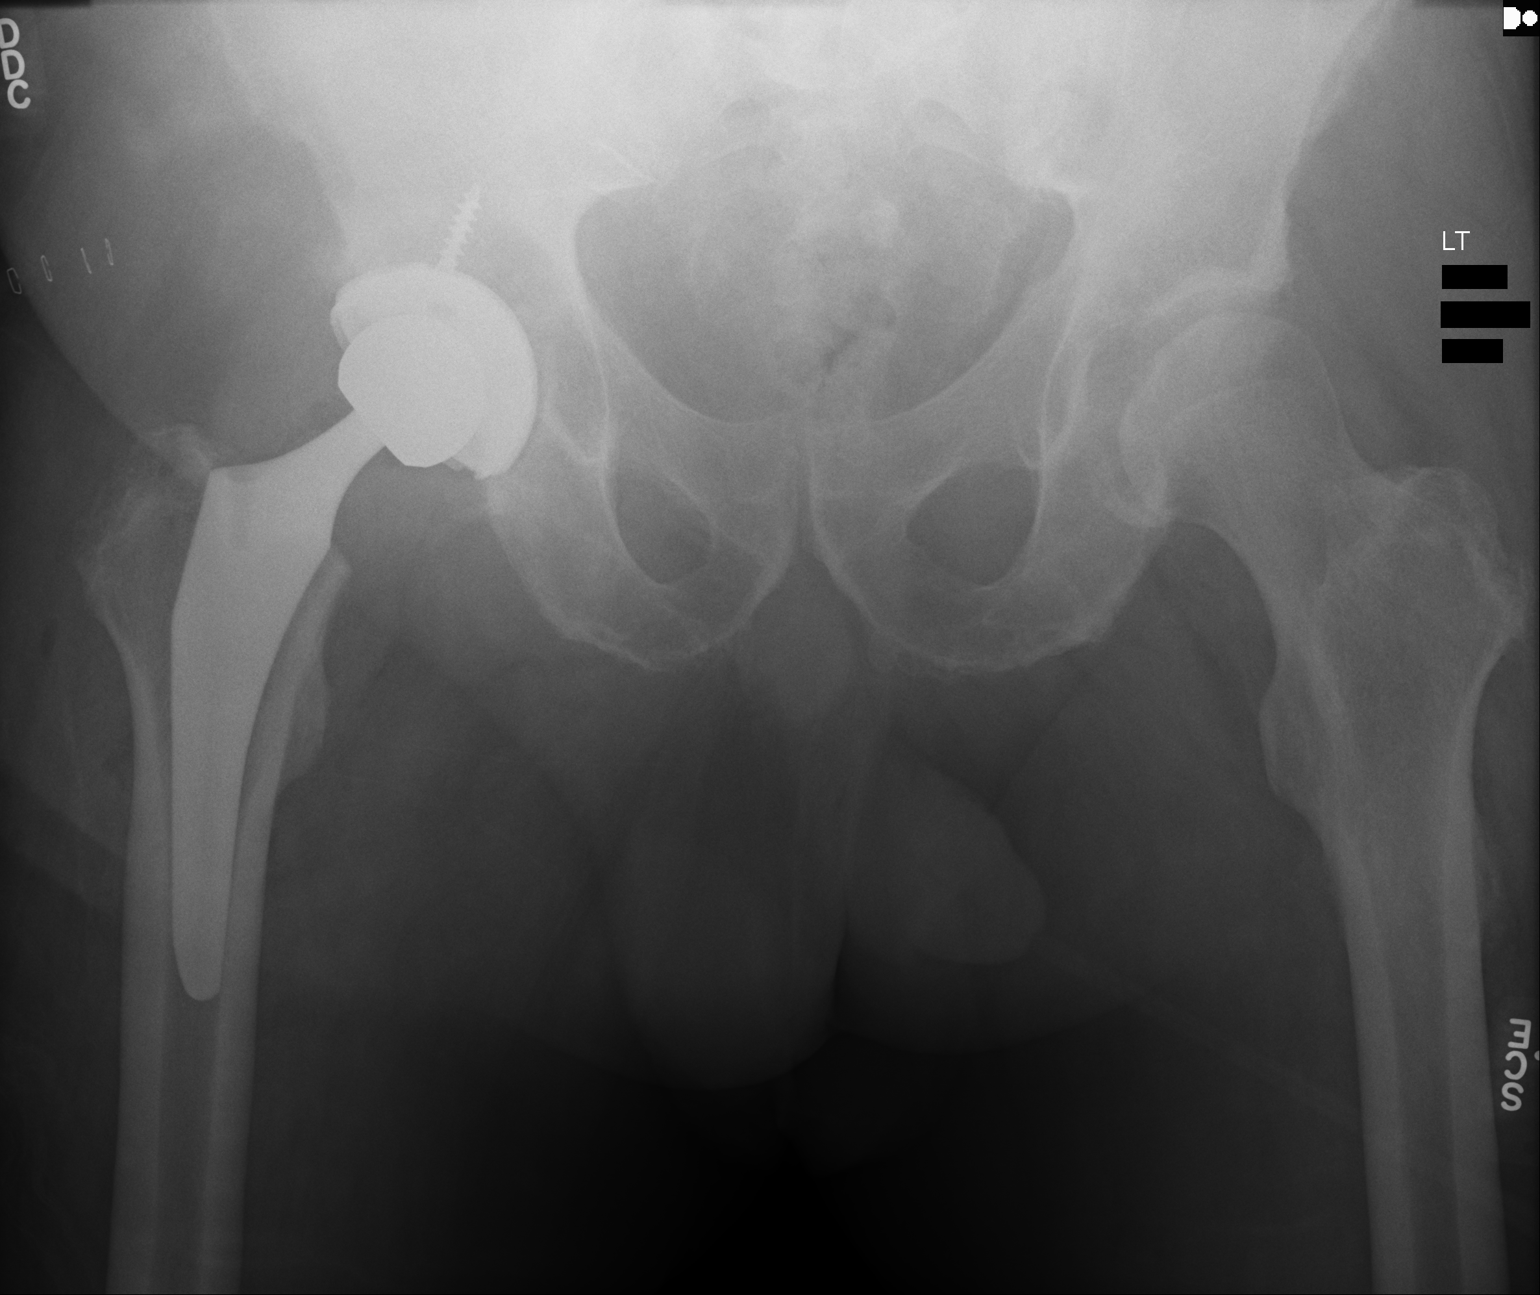

[1 of 1 positions shown; findings below may reference images not displayed]

FINDINGS: Examination demonstrates evidence of patient's right total hip
arthroplasty with femoral and acetabular components intact and
normally located. Skin staples are present over the superior lateral
soft tissues. Stable mild to moderate degenerative changes of the
left hip.
IMPRESSION: Normal postoperative appearance right total hip arthroplasty.

## 2015-03-27 ENCOUNTER — Encounter: Payer: Self-pay | Admitting: Internal Medicine

## 2015-03-27 NOTE — Assessment & Plan Note (Signed)
Continue oxycodone bid for neck pain with failed surgery

## 2015-03-27 NOTE — Assessment & Plan Note (Signed)
He has recurrent bruising of forearms and recently adopted a puppy.

## 2015-04-02 ENCOUNTER — Inpatient Hospital Stay
Admit: 2015-04-02 | Disposition: A | Discharge status: Home / Self Care | Payer: Self-pay | Attending: Specialist | Admitting: Specialist

## 2015-04-02 ENCOUNTER — Telehealth: Payer: Self-pay | Admitting: Internal Medicine

## 2015-04-02 DIAGNOSIS — N400 Enlarged prostate without lower urinary tract symptoms: Secondary | ICD-10-CM | POA: Diagnosis not present

## 2015-04-02 DIAGNOSIS — R079 Chest pain, unspecified: Secondary | ICD-10-CM | POA: Diagnosis not present

## 2015-04-02 DIAGNOSIS — I10 Essential (primary) hypertension: Secondary | ICD-10-CM | POA: Diagnosis not present

## 2015-04-02 DIAGNOSIS — Z8679 Personal history of other diseases of the circulatory system: Secondary | ICD-10-CM | POA: Diagnosis not present

## 2015-04-02 DIAGNOSIS — R0602 Shortness of breath: Secondary | ICD-10-CM | POA: Diagnosis not present

## 2015-04-02 DIAGNOSIS — J9601 Acute respiratory failure with hypoxia: Secondary | ICD-10-CM | POA: Diagnosis not present

## 2015-04-02 DIAGNOSIS — R0902 Hypoxemia: Secondary | ICD-10-CM | POA: Diagnosis not present

## 2015-04-02 DIAGNOSIS — A419 Sepsis, unspecified organism: Secondary | ICD-10-CM | POA: Diagnosis not present

## 2015-04-02 DIAGNOSIS — R05 Cough: Secondary | ICD-10-CM | POA: Diagnosis not present

## 2015-04-02 DIAGNOSIS — Z882 Allergy status to sulfonamides status: Secondary | ICD-10-CM | POA: Diagnosis not present

## 2015-04-02 DIAGNOSIS — R0689 Other abnormalities of breathing: Secondary | ICD-10-CM | POA: Diagnosis not present

## 2015-04-02 DIAGNOSIS — I509 Heart failure, unspecified: Secondary | ICD-10-CM | POA: Diagnosis not present

## 2015-04-02 DIAGNOSIS — J189 Pneumonia, unspecified organism: Secondary | ICD-10-CM | POA: Diagnosis not present

## 2015-04-02 DIAGNOSIS — J441 Chronic obstructive pulmonary disease with (acute) exacerbation: Secondary | ICD-10-CM | POA: Diagnosis not present

## 2015-04-02 DIAGNOSIS — G8929 Other chronic pain: Secondary | ICD-10-CM | POA: Diagnosis not present

## 2015-04-02 LAB — CBC WITH DIFFERENTIAL/PLATELET
BASOS PCT: 0.9 %
Basophil #: 0.2 10*3/uL — ABNORMAL HIGH (ref 0.0–0.1)
Eosinophil #: 0.3 10*3/uL (ref 0.0–0.7)
Eosinophil %: 1.6 %
HCT: 42.9 % (ref 40.0–52.0)
HGB: 13.4 g/dL (ref 13.0–18.0)
Lymphocyte #: 2 10*3/uL (ref 1.0–3.6)
Lymphocyte %: 12.1 %
MCH: 25.1 pg — ABNORMAL LOW (ref 26.0–34.0)
MCHC: 31.3 g/dL — ABNORMAL LOW (ref 32.0–36.0)
MCV: 80 fL (ref 80–100)
MONOS PCT: 3.6 %
Monocyte #: 0.6 x10 3/mm (ref 0.2–1.0)
Neutrophil #: 13.8 10*3/uL — ABNORMAL HIGH (ref 1.4–6.5)
Neutrophil %: 81.8 %
Platelet: 198 10*3/uL (ref 150–440)
RBC: 5.35 10*6/uL (ref 4.40–5.90)
RDW: 18 % — ABNORMAL HIGH (ref 11.5–14.5)
WBC: 16.8 10*3/uL — AB (ref 3.8–10.6)

## 2015-04-02 LAB — PROTIME-INR
INR: 0.9
PROTHROMBIN TIME: 12.5 s

## 2015-04-02 LAB — COMPREHENSIVE METABOLIC PANEL
ANION GAP: 9 (ref 7–16)
Albumin: 4.1 g/dL
Alkaline Phosphatase: 79 U/L
BILIRUBIN TOTAL: 0.8 mg/dL
BUN: 19 mg/dL
CALCIUM: 9.3 mg/dL
CO2: 29 mmol/L
Chloride: 101 mmol/L
Creatinine: 1 mg/dL
EGFR (Non-African Amer.): >60
Glucose: 149 mg/dL — ABNORMAL HIGH
Potassium: 3.7 mmol/L
SGOT(AST): 18 U/L
SGPT (ALT): 12 U/L — ABNORMAL LOW
SODIUM: 139 mmol/L
Total Protein: 7.7 g/dL

## 2015-04-02 LAB — URINALYSIS, COMPLETE
BACTERIA: NONE SEEN
Bilirubin,UR: NEGATIVE
Blood: NEGATIVE
GLUCOSE, UR: NEGATIVE mg/dL (ref 0–75)
Ketone: NEGATIVE
LEUKOCYTE ESTERASE: NEGATIVE
Nitrite: NEGATIVE
Ph: 6 (ref 4.5–8.0)
Protein: NEGATIVE
Specific Gravity: 1.013 (ref 1.003–1.030)

## 2015-04-02 LAB — RAPID INFLUENZA A&B ANTIGENS (ARMC ONLY)

## 2015-04-02 LAB — PHOSPHORUS: Phosphorus: 2.7 mg/dL

## 2015-04-02 LAB — TROPONIN I

## 2015-04-02 LAB — MAGNESIUM: MAGNESIUM: 1.8 mg/dL

## 2015-04-02 LAB — PRO B NATRIURETIC PEPTIDE: B-Type Natriuretic Peptide: 71 pg/mL

## 2015-04-02 LAB — LACTIC ACID, PLASMA: LACTIC ACID, VENOUS: 1.8 mmol/L

## 2015-04-02 NOTE — Telephone Encounter (Signed)
The patient has been admitted to [facility]. He called to inform Dr. [name] . If she wishes to call the patient he can be reached on his cell phone.

## 2015-04-03 LAB — CBC WITH DIFFERENTIAL/PLATELET
BASOS PCT: 0.3 %
Basophil #: 0 10*3/uL (ref 0.0–0.1)
Eosinophil #: 0.1 10*3/uL (ref 0.0–0.7)
Eosinophil %: 0.4 %
HCT: 33.9 % — ABNORMAL LOW (ref 40.0–52.0)
HGB: 10.7 g/dL — ABNORMAL LOW (ref 13.0–18.0)
Lymphocyte #: 1.2 10*3/uL (ref 1.0–3.6)
Lymphocyte %: 8.1 %
MCH: 25.5 pg — AB (ref 26.0–34.0)
MCHC: 31.7 g/dL — ABNORMAL LOW (ref 32.0–36.0)
MCV: 81 fL (ref 80–100)
MONO ABS: 0.8 x10 3/mm (ref 0.2–1.0)
MONOS PCT: 5.2 %
Neutrophil #: 13.3 10*3/uL — ABNORMAL HIGH (ref 1.4–6.5)
Neutrophil %: 86 %
Platelet: 124 10*3/uL — ABNORMAL LOW (ref 150–440)
RBC: 4.21 10*6/uL — AB (ref 4.40–5.90)
RDW: 17.8 % — ABNORMAL HIGH (ref 11.5–14.5)
WBC: 15.4 10*3/uL — AB (ref 3.8–10.6)

## 2015-04-03 LAB — BASIC METABOLIC PANEL
Anion Gap: 5 — ABNORMAL LOW (ref 7–16)
BUN: 16 mg/dL
CO2: 30 mmol/L
Calcium, Total: 8.4 mg/dL — ABNORMAL LOW
Chloride: 100 mmol/L — ABNORMAL LOW
Creatinine: 1 mg/dL
EGFR (African American): >60
Glucose: 161 mg/dL — ABNORMAL HIGH
POTASSIUM: 3.2 mmol/L — AB
Sodium: 135 mmol/L

## 2015-04-04 LAB — CBC WITH DIFFERENTIAL/PLATELET
BASOS ABS: 0 10*3/uL (ref 0.0–0.1)
Basophil %: 0.4 %
Eosinophil #: 0 10*3/uL (ref 0.0–0.7)
Eosinophil %: 0.4 %
HCT: 35.1 % — ABNORMAL LOW (ref 40.0–52.0)
HGB: 11 g/dL — ABNORMAL LOW (ref 13.0–18.0)
Lymphocyte #: 1.4 10*3/uL (ref 1.0–3.6)
Lymphocyte %: 12.7 %
MCH: 25.6 pg — ABNORMAL LOW (ref 26.0–34.0)
MCHC: 31.5 g/dL — ABNORMAL LOW (ref 32.0–36.0)
MCV: 81 fL (ref 80–100)
MONOS PCT: 6.6 %
Monocyte #: 0.7 x10 3/mm (ref 0.2–1.0)
Neutrophil #: 8.8 10*3/uL — ABNORMAL HIGH (ref 1.4–6.5)
Neutrophil %: 79.9 %
Platelet: 150 10*3/uL (ref 150–440)
RBC: 4.31 10*6/uL — ABNORMAL LOW (ref 4.40–5.90)
RDW: 17.4 % — ABNORMAL HIGH (ref 11.5–14.5)
WBC: 11 10*3/uL — AB (ref 3.8–10.6)

## 2015-04-04 LAB — POTASSIUM: POTASSIUM: 3.6 mmol/L

## 2015-04-04 LAB — URINE CULTURE

## 2015-04-05 ENCOUNTER — Telehealth: Payer: Self-pay | Admitting: Internal Medicine

## 2015-04-05 NOTE — Telephone Encounter (Signed)
i can't say for sure, since he has taken prednisone and levaquin  Plenty of times before.  Has he been checked for glaucoma in the last year?  Is he having any discharge fro [facility]?  Any vision changes ? arr the eyes bloodshot?

## 2015-04-05 NOTE — Telephone Encounter (Addendum)
     Discharge Date: 04/04/15  Transition Care Management Follow-up Telephone Call  How have you been since you were released from the hospital? Pt was discharged home yesterday. States he has low energy, but is taking it easy, has run a few errands.    Do you understand why you were in the hospital?  YES. Pneumonia, COPD exacerbation   Do you understand the discharge instructions? YES, reviewed new medications with patient. See below.   Items Reviewed:  Medications reviewed: YES. Started on Prednisone taper x 3 days and Levaquin 750 mg daily x 5 days. Has picked up Rxs and started.   Allergies reviewed: YES, no new allergies  Dietary changes reviewed: No changes  Referrals reviewed: n/a   Functional Questionnaire:   Activities of Daily Living (ADLs):  States they are independent in the following: Independent with ADLs.  States they require assistance with the following: None   Any transportation issues/concerns?: NO   Any patient concerns? Yes, pt states since discharge he has noticed pain around bilateral eye sockets, mainly around the right eye.  Michaelyn BarterWanders if it is related to one of his medications? Denies sinus congestion or pressure. Denies edema, denies fever.    Confirmed importance and date/time of follow-up visits scheduled: YES 04/12/15 @ 2:00 with Dr. [name]   Confirmed with patient if condition begins to worsen call PCP or go to the ER. Patient was given the Call-a-Nurse line [phone]: YES

## 2015-04-05 NOTE — Telephone Encounter (Signed)
TCM call completed. See below. Pt had one question/concern. Advised I would let Dr. [name] know.  Any patient concerns? Yes, pt states since discharge he has noticed pain around bilateral eye sockets, mainly around the right eye.  Michaelyn BarterWanders if it is related to one of his medications? Denies sinus congestion or pressure. Denies edema, denies fever, no rash.

## 2015-04-05 NOTE — Telephone Encounter (Signed)
Called and scheduled patient hospital follow up for 04/12/15, FYI

## 2015-04-05 NOTE — Telephone Encounter (Signed)
Left message for pt to return my call.

## 2015-04-06 LAB — EXPECTORATED SPUTUM ASSESSMENT W REFEX TO RESP CULTURE

## 2015-04-06 NOTE — Telephone Encounter (Signed)
Since annual eye exams are recommended for everyone over 65 for glaucoma checks, Offer to set that up for him bc I can't really offer any advice about what else could be causing it, except maybe allergies.

## 2015-04-06 NOTE — Telephone Encounter (Signed)
Patient denies any discharge or redness to either eye. Patient stated his eyes have always been watery just the left eye seems to be painful at a 2 on scale of 1  To 10, patient stated he is having allergies, patient also confirmed last eye doctor 1 year ago and was checked for glaucoma at that time.

## 2015-04-07 LAB — CULTURE, BLOOD (SINGLE)

## 2015-04-07 NOTE — Telephone Encounter (Signed)
Patient will discuss at visit.   

## 2015-04-12 ENCOUNTER — Ambulatory Visit (INDEPENDENT_AMBULATORY_CARE_PROVIDER_SITE_OTHER): Payer: Commercial Managed Care - HMO | Admitting: Internal Medicine

## 2015-04-12 ENCOUNTER — Encounter: Payer: Self-pay | Admitting: Internal Medicine

## 2015-04-12 VITALS — BP 148/80 | HR 80 | Temp 98.4°F | Resp 18 | Ht 74.0 in | Wt 278.2 lb

## 2015-04-12 DIAGNOSIS — Z635 Disruption of family by separation and divorce: Secondary | ICD-10-CM

## 2015-04-12 DIAGNOSIS — J189 Pneumonia, unspecified organism: Secondary | ICD-10-CM

## 2015-04-12 DIAGNOSIS — D509 Iron deficiency anemia, unspecified: Secondary | ICD-10-CM

## 2015-04-12 DIAGNOSIS — Z630 Problems in relationship with spouse or partner: Secondary | ICD-10-CM

## 2015-04-12 DIAGNOSIS — R5382 Chronic fatigue, unspecified: Secondary | ICD-10-CM

## 2015-04-12 DIAGNOSIS — J449 Chronic obstructive pulmonary disease, unspecified: Secondary | ICD-10-CM

## 2015-04-12 MED ORDER — IPRATROPIUM BROMIDE 0.03 % NA SOLN: 2.0000 | Freq: Two times a day (BID) | NASAL | Status: DC
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
## 2015-04-12 NOTE — Patient Instructions (Addendum)
Contact [email or web address] for a catalogue of their compression stockings. (they make them in an athletic sock)  I have refilled your Atrovent nasal spray  Please return in 4 weeks

## 2015-04-13 DIAGNOSIS — Z635 Disruption of family by separation and divorce: Secondary | ICD-10-CM | POA: Insufficient documentation

## 2015-04-13 DIAGNOSIS — J189 Pneumonia, unspecified organism: Secondary | ICD-10-CM | POA: Insufficient documentation

## 2015-04-13 NOTE — Assessment & Plan Note (Signed)
MILD,multifactorial with normal TIBC, low iron saturation.  Will continue once daily ferrous sulfate    Lab Results  Component Value Date   HGB 11.7* 01/11/2015

## 2015-04-13 NOTE — Assessment & Plan Note (Addendum)
GOLD stage III 07/2010 PFT [facility] >>Ratio 44%, FEV1 1.2 (33%), TLC 5.47 L ( 80% pred) DLCO 59% pred  History of VATS with decortication and pleural biopsy Nov 2004 ([facility] [place]) complicated by PTX   09/2013 CT chest > resolved RLL patchy ggo and airspace disease, persistent scar RML with no change on repeat CT march 2016

## 2015-04-13 NOTE — Assessment & Plan Note (Signed)
No reversible causes given his refusal to treat OSA, other deficiencies addressed

## 2015-04-13 NOTE — Assessment & Plan Note (Addendum)
Treated with 7 days of levaquin. RML infiltrate; CT done one month earlier showed persistent unchanged RML scarring, nothing more

## 2015-04-13 NOTE — Assessment & Plan Note (Signed)
complicated by wife's ongoing alcohol abuse .Marland Kitchen.   A total of 40 minutes was spent with patient more than half of which was spent in counseling patient on the above mentioned issues , reviewing and explaining recent labs and imaging studies done, and coordination of care.

## 2015-04-16 DIAGNOSIS — N411 Chronic prostatitis: Secondary | ICD-10-CM | POA: Diagnosis not present

## 2015-04-16 DIAGNOSIS — I10 Essential (primary) hypertension: Secondary | ICD-10-CM | POA: Diagnosis not present

## 2015-04-16 DIAGNOSIS — Z96641 Presence of right artificial hip joint: Secondary | ICD-10-CM | POA: Diagnosis not present

## 2015-04-16 DIAGNOSIS — R361 Hematospermia: Secondary | ICD-10-CM | POA: Diagnosis not present

## 2015-04-16 DIAGNOSIS — Z905 Acquired absence of kidney: Secondary | ICD-10-CM | POA: Diagnosis not present

## 2015-04-16 DIAGNOSIS — N401 Enlarged prostate with lower urinary tract symptoms: Secondary | ICD-10-CM | POA: Diagnosis not present

## 2015-04-16 DIAGNOSIS — R600 Localized edema: Secondary | ICD-10-CM | POA: Diagnosis not present

## 2015-04-16 DIAGNOSIS — R339 Retention of urine, unspecified: Secondary | ICD-10-CM | POA: Diagnosis not present

## 2015-04-17 NOTE — H&P (Signed)
Subjective/Chief Complaint Right hip osteoarthritis   History of Present Illness 69 year old male who has had progressively worsening right hip pain.  Pain is now so severe that he has difficulty walking.  Patient has joint space narrowing, subchondral cysts and sclerosis and osteophytes by xrays.  He has failed non-operative management and wishes to proceed with a right total hip arthroplasty.   Past Med/Surgical Hx:  Folliculitis to scrotum:   CVA x 2:   MI - Myocardial Infarct:   5 left eye surgeries for lazy eye:   copd:   AAA - Abdominal Aortic Aneurysm Repair:   nose surgery:   left nephrectomy:   ALLERGIES:  Catapres: Rash  Sulfa drugs: Other  HOME MEDICATIONS: Medication Instructions Status  albuterol CFC free 90 mcg/inh aerosol 2 puff(s) inhaled 4 times a day, As Needed Active  carvedilol 3.125 mg oral tablet 1 tab(s) orally 2 times a day Active  Aspirin Low Dose 81 mg oral delayed release tablet 1 tab(s) orally once a day Active  oxyCODONE 15 mg oral tablet 1 tab(s) orally every 8 hours, As Needed - for Pain Active  Advair Diskus 500 mcg-50 mcg inhalation powder 1 puff(s) inhaled 2 times a day Active  amLODIPine 5 mg oral tablet 1 tab(s) orally once a day Active  Dulcolax Laxative 5 mg oral delayed release tablet 2-3 tab(s) orally once a day Active  nitroglycerin 0.3 mg sublingual tablet 1  sublingual  (under tongue) every 5 minutes as needed for chest pain, up to 3 times then call 911 Active  docusate sodium 100 mg oral capsule 1 cap(s) orally once a day (in the morning), As Needed - for Chest Pain, for Constipation  Active   Family and Social History:  Family History Non-Contributory   Place of Living Home   Review of Systems:  Subjective/Chief Complaint Severe right hip pain   Medications/Allergies Reviewed Medications/Allergies reviewed   Physical Exam:  GEN no acute distress   HEENT PERRL, hearing intact to voice, moist oral mucosa, Oropharynx clear    NECK supple  No masses  trachea midline   RESP normal resp effort  clear BS  no use of accessory muscles   CARD regular rate  no murmur   EXTR Chronic venous stasis changes, mild chronic lower leg edema.  Right leg shorter by approximately half an inch.  Motor/sensory function intact.  Right hip is limited in IR and ER.   SKIN normal to palpation, No ulcers   NEURO motor/sensory function intact   PSYCH A+O to time, place, person   Lab Results: Routine BB:  12-Nov-15 13:01   Crossmatch Unit 1 Ready  Crossmatch Unit 2 Ready (Result(s) reported on 11 Nov 2014 at 10:58AM.)   Radiology Results: XRay:    10-Apr-15 13:26, Hip Right Complete  Hip Right Complete  REASON FOR EXAM:    hip pain  COMMENTS:       PROCEDURE: DXR - DXR HIP RIGHT COMPLETE  - Apr 03 2014  1:26PM     CLINICAL DATA:  Persistent right hip pain.  No injury    EXAM:  RIGHT HIP - COMPLETE 2+ VIEW    COMPARISON:  None.    FINDINGS:  No fracture or dislocation.  There is marked superior lateral right hip joint space narrowing,  subchondral sclerosis, bony prominence along the superior acetabular  ran and small osteophytes from the base of the right femoral head.    The left hip joint is normally space and  aligned.    Bones are demineralized.  SI joints are normally aligned.    Soft tissues are unremarkable.     IMPRESSION:  1. No fracture or dislocation.  2. Advanced arthropathic changes. Findings are consistent with  osteoarthritis. Mild right hip dysplasia suggested.  Electronically Signed    By: Amie Portland M.D.    On: 04/03/2014 14:08         Verified By: Domenic Moras, M.D.,    27-May-15 14:17, Knee Right Complete  Knee Right Complete  REASON FOR EXAM:    persistent knee pain  COMMENTS:       PROCEDURE: DXR - DXR KNEE RT COMP WITH OBLIQUES  - May 20 2014  2:17PM     CLINICAL DATA:  Knee pain for 3 to 4 years.    EXAM:  RIGHT KNEE - COMPLETE 4+ VIEW    COMPARISON:   None.    FINDINGS:  There is no evidence of fracture, dislocation, or joint effusion.  There is minimal tibial spine osteophytosis. There is minimal  narrowing of the medial femoral tibial joint space. Soft tissues are  unremarkable.     IMPRESSION:  No acute fractureor dislocation. Minimal degenerative joint changes  of right knee.      Electronically Signed    By: Sherian Rein M.D.    On: 05/20/2014 15:47         Verified By: Sherian Rein, M.D.,  LabUnknown:    10-Apr-15 13:26, Hip Right Complete  PACS Image    27-May-15 14:17, Knee Right Complete  PACS Image    Assessment/Admission Diagnosis Right hip osteoarthritis   Plan Patient is scheduled for a right total hip arthroplasty.  The risks and benefits of surgical intervention were discussed in detail with the patient. The patient expressed understanding of the risks and benefits and agreed with plans for surgery. The risks include, but are not limited to: infection requiring removal of the prosthesis, bleeding requiring transfusion, nerve and blood vessel injury (especially the sciatic nerve leading to foot drop), dislocation, fracture, dislocation, leg length discrepancy, change in lower extremity rotation, persistent hip pain or loss of motion, need for more surgery including conversion to a total hip arthroplasty, DVT, and PE, MI, stroke, pneumonia, respiratory failure and death.  He has been cleared for surgery by his PCP, Dr. Darrick Huntsman.  Surgical site signed as per "right site surgery" protocol.   Electronic Signatures: Juanell Fairly (MD)  (Signed [phone] 12:53)  Authored: CHIEF COMPLAINT and HISTORY, PAST MEDICAL/SURGIAL HISTORY, ALLERGIES, HOME MEDICATIONS, FAMILY AND SOCIAL HISTORY, REVIEW OF SYSTEMS, PHYSICAL EXAM, LABS, Radiology, ASSESSMENT AND PLAN   Last Updated: 18-Nov-15 12:53 by Juanell Fairly (MD)

## 2015-04-17 NOTE — Op Note (Signed)
PATIENT NAME:  Victor Daniels, COLEE MR#:  [id number] DATE OF BIRTH:  03-03-1946  DATE OF PROCEDURE:  11/11/2014  PREOPERATIVE DIAGNOSIS: Right hip osteoarthritis.   POSTOPERATIVE DIAGNOSIS: Right hip osteoarthritis.      PROCEDURE: Right total hip arthroplasty.   SURGEON: Juanell Fairly, MD    ASSISTANT: Dr. Myra Rude.   ANESTHESIA: Spinal.   ESTIMATED BLOOD LOSS: 250 mL    URINE OUTPUT: 200 mL   IMPLANTS: Stryker Accolade 2; 132 degree neck angle hip stem size 6, a Stryker Tritanium hemispherical cluster hole acetabular shell size 58 mm, a Stryker Torx 6.5 mm cancellous bone screw x 1 and 25 mm in length, acetabular dome hole plug, a Trident X3, 10 degree polyethylene liner, 36 mm in diameter and finally, a Stryker LFIT anatomic V40 femoral head 36 + 5.   INDICATIONS FOR PROCEDURE: Victor Daniels is a 69 year old male who has had severe pain in the right hip making it difficult for him to stand or ambulate.  He has radiographs demonstrating bone on bone contact in the right hip, subchondral cyst formation and sclerosis with marginal osteophytes.  Given his severe disability due to his pain, he decided to proceed with a total hip arthroplasty.   I reviewed the risks and benefits of surgery with the patient prior to surgery. He understands that the risks include infection requiring the removal of the prosthesis, bleeding requiring blood transfusion, nerve or blood vessel injury, especially injury to the sciatic nerve leading to permanent foot drop and/or numbness in operative leg, persistent hip pain, change in leg length or change in lower extremity rotation, fracture, dislocation, persistent hip pain or weakness and the need for further surgery including revision total hip arthroplasty. Medical risks include, but are not limited to deep vein thrombosis and pulmonary embolism, myocardial infarction, stroke, pneumonia, respiratory failure and death. The patient understood these  risks and wished to proceed.   PROCEDURE NOTE: The patient was brought to the Operating Room. He underwent a spinal anesthetic by the anesthesia service. A Foley was placed by the circulating nurse.  He was then placed on his left side in a left lateral decubitus position.  An axillary roll was placed under his left side, and all bony prominences were adequately padded. The padding was placed under the left leg to avoid pressure on the common peroneal nerve during this case. A pegboard was used to position the patient. He was then prepped and draped in a sterile fashion.  The timeout was performed to verify the patient's name, date of birth, medical record number, correct site of surgery and correct procedure to be performed.  It was also used to verify the patient had received antibiotics and that all appropriate instruments, implants, and radiographic studies were available in the room. Once all in attendance were in agreement, the case began.   A curvilinear posterior lateral incision was made over the right hip. The subcutaneous tissues were dissected using electrocautery and all bleeding vessels were cauterized during exposure. The fascia lata was then identified and sharply incised with a deep #10 blade revealing the underlying hip bursa. Bursa was resected off the greater trochanter. The external rotators were then identified and dissected off their posterior attachment to the greater trochanter. They were tagged with a #2 Tycron and reflected posteriorly to protect the sciatic nerve. The underlying hip capsule was then identified.  A T-shaped capsulotomy was created and again a #2 Tycron was used to tag each leaflet of the capsulotomy for later  repair.  Right hip was then dislocated. An oscillating saw was then used to make a proximal femoral osteotomy approximately a centimeter above the lesser trochanter.  The attention was then turned to the acetabulum. Two straight Hohmann retractors were placed over  the anterior and posterior rim of the acetabulum. The labrum was resected using a #10 blade.  Then sequential acetabular reamers were used to prepare the acetabulum for the prosthesis.  Initially, a 46 mm basket was used to medialize the acetabulum close to the medial wall.  Then sequential baskets were used to achieve an excellent rim fit. The patient had the best rim fit with a 58 mm hemispherical trial. The actual Tritanium 58 mm hemispherical cluster hole shell was then impacted into the acetabulum. A single 6.5 Torx cancellous bone screw was placed through 1 of the cluster holes in the dome for additional fixation.  A trial liner was placed in the acetabulum. The attention was then turned to preparation of the femur.   A femoral hip skid was placed under the femoral neck along with a cobra retractor for adequate visualization of the proximal femur.  A box osteotome was used to make the initial entry into the proximal femur.  A single hand reamer was then sent down the femoral canal, and a femoral canal sounder was then used to confirm that no penetration of the femoral cortex had occurred during reaming.  Sequential broaches were then used to fill the proximal femoral canal.  Initially a size 4 stem with a trial 132 degree neck angle and a 36 + 0 head was placed.   This was then reduced. The ligaments were slightly short so then the 0 was exchanged out for a 36 + 5 head.  The leg lengths were improved.  AP pelvis was taken of this construct.  The leg lengths were adequate but the size 4 stem was found to be undersized.  Therefore, the femoral canal was re- broached and a size 6 was implanted with excellent stability.  Again, the 36 + 5 head was placed on the femoral stem and the hip taken through a range of motion and found to have excellent stability. All femoral trials were removed.   Two Hohmann retractors were then placed again over the anterior and posterior rims of the acetabulum. The trial acetabular  shell was removed.  A Stryker Trident X3, 10 degree polyethylene, a 36 mm liner was then implanted after a dome screw had been placed in the acetabular shell to eliminate the central hole. The liner was checked for stability.   The actual femoral implants were then implanted.  The Accolade 2, 132 degree neck angle size 6 hip stem was then implanted and gently malleted into position.  It had excellent stability. The actual 36 + 5 head was then [facility] onto the trunnion.  The construct was reduced.  It was again taken through a full range of motion and found to have excellent stability. The leg lengths were equivalent. The hip joint was then copiously irrigated with a pulse lavage impregnated with GU saline. The capsule was repaired using #2 Tycron.  A soft tissue repair was performed on the external rotators.  The hip joint again was copiously irrigated. The fascia lata was closed with an interrupted 0 Vicryl.  The subcutaneous tissue was closed in 2 layers with 2-0 Vicryl. The skin was approximated with staples.  A dry sterile dressing was applied.  The patient was then turned onto his back on  the OR table.  Leg lengths were equivalent on examination.  A hip abduction pillow was placed between his legs. He was transferred carefully to a hospital bed and brought to the PACU in stable condition.   I was scrubbed and present for the entire case, and all sharp and instrument counts were correct at the conclusion of the case.  I spoke with his wife in the postoperative consultation room to let her know the case had gone without complication and the patient was stable in the recovery room.      ____________________________ Kathreen Devoid, MD klk:DT D: 11/16/2014 08:31:50 ET T: 11/16/2014 08:55:06 ET JOB#: [id number]  cc: Kathreen Devoid, MD, <Dictator> Kathreen Devoid MD ELECTRONICALLY SIGNED 11/17/2014 10:06

## 2015-04-17 NOTE — Discharge Summary (Signed)
PATIENT NAME:  Victor Daniels, [name] MR#:  [id number] DATE OF BIRTH:  [birth year]  DATE OF ADMISSION:  11/11/2014 DATE OF DISCHARGE:  11/14/2014  REASON FOR ADMISSION: Status post right total hip arthroplasty.   HOSPITAL COURSE: Victor Daniels is a 69 year old male who underwent an uncomplicated right total hip arthroplasty on 11/11/2014. He was admitted to the orthopedic surgery floor postoperatively. On the date of the operation, the patient was stable and doing well postoperatively. Later that night, he was found to have significant drainage from his right hip incision. The orthopedic surgeon placed a pressure dressing over the right hip. The patient struggled with postoperative pain control as well as he is normally on oxycodone 15 mg every 8 at home. The following morning, the patient's drainage had all but stopped. Nevertheless, the decision was made to place a Prevena VAC dressing over his right hip incision. This was placed by the orthopedic surgeon. The patient's pain was much better controlled. He was able to participate with physical therapy and was up out of bed to a chair. The patient completed 24 hours of postoperative antibiotics.   Physical therapy and occupational therapy saw and evaluated the patient beginning on postoperative day #1. On postoperative day #2, the patient's Foley was removed. He continued to have excellent pain control and was making appropriate progress with physical therapy. It was recommended, however, that he go to rehab upon discharge. Given the patient's clinical improvement, he is being prepared for discharge on postoperative day #3. His laboratory studies remained stable postoperatively as did his vital signs. The Prevena dressing continued to function but was not putting out any significant drainage. There was no redness or erythema around his incision. His thigh compartments were soft and compressible. Distally, he was neurovascularly intact and had intact  dorsiflexion, plantar flexion of the ankle on the right side. He had no calf tenderness. He can flex and extend his toes. His sensation was at baseline. He has some baseline loss of sensation in both feet, likely due to a neuropathy.   DISCHARGE INSTRUCTIONS: The patient will be discharged to rehab with instructions to continue carefully follow posterior precautions. He will continue the Prevena dressing until he returns to the office in approximately 7 days. He will be partially weight-bearing on the right lower extremity. He will continue wearing his TED stockings. The right lower extremity should be elevated and ice may be applied to the right hip. He will continue physical and occupational therapy at rehab. He would require these services to improve his gait training, hip range of motion and lower extremity strengthening. He will use a walker for assistance with ambulation. He will be partially weight-bearing for a total of 4 weeks postoperative. He will remain on Lovenox 40 mg b.i.d. for postoperative DVT prophylaxis given his weight. He will remain on oxycodone 10 to 15 mg every 3 to 4 hours p.r.n. for pain control.      ____________________________ Kathreen DevoidKevin L. Clevester Helzer, MD klk:TT D: 11/13/2014 17:35:00 ET T: 11/13/2014 18:18:56 ET JOB#: [id number]  cc: Kathreen DevoidKevin L. Stanton Kissoon, MD, <Dictator> Kathreen DevoidKEVIN L Vinessa Macconnell MD ELECTRONICALLY SIGNED 11/17/2014 10:06

## 2015-04-19 LAB — SURGICAL PATHOLOGY

## 2015-04-25 NOTE — H&P (Signed)
PATIENT NAME:  Victor Daniels, RIDDLES MR#:  [id number] DATE OF BIRTH:  [birth year]  DATE OF ADMISSION:  04/02/2015  PRIMARY CARE PHYSICIAN: Duncan Dull, MD   CHIEF COMPLAINT: Shortness of breath.   HISTORY OF PRESENT ILLNESS: This is a 69 year old male who presents to the hospital due to progressive shortness of breath over the past 2 days. The patient has a history of underlying COPD, but is not on oxygen at home. He says that his shortness of breath has gotten progressively worse, even at rest or with minimal exertion. He also has had chills, cough, which is nonproductive, but no nausea, no vomiting. Positive chest pain associated with the cough. He came to the ER for further evaluation and was noted to be febrile with a temperature of 101, also noted to be tachycardic, and chest x-ray findings suggestive of pneumonia. Hospitalist services were contacted for further treatment and evaluation.   REVIEW OF SYSTEMS:  CONSTITUTIONAL: Positive documented fever of 101. Positive weakness. No weight gain, no weight loss.  EYES: No blurry or double vision.  ENT: No tinnitus. No postnasal drip. No redness of the oropharynx.  RESPIRATORY: Positive cough, no wheeze, no hemoptysis. Positive dyspnea.  CARDIOVASCULAR: No chest pain. No orthopnea, no palpitations, no syncope.  GASTROINTESTINAL: No nausea. No vomiting, diarrhea. No abdominal pain. No melena or hematochezia.  GENITOURINARY: No dysuria or hematuria.  ENDOCRINE: No polyuria or nocturia. No heat or cold intolerance.  HEMATOLOGY: No anemia, no bruising, no bleeding.  INTEGUMENTARY: No rashes, no lesions.  MUSCULOSKELETAL: No arthritis, no swelling, no gout.  NEUROLOGIC: No numbness, tingling, or ataxia. No seizure-type activity.  PHYCHIATRIC: No anxiety, no insomnia, no ADD.   PAST MEDICAL HISTORY: Consistent with a history of COPD, hypertension, CHF, chronic pain, BPH.   ALLERGIES: CATAPRES AND SULFA DRUGS.   SOCIAL HISTORY: He used to be a  smoker, a pack per day for 25 years, quit 5 years ago. Also used to drink heavily, now just drinks occasionally. No illicit drug abuse. Lives at home with his wife.   FAMILY HISTORY: Mother and father both deceased. Both died from complications of an MI.   CURRENT MEDICATIONS: As follows: Advair 500/50 one puff b.i.d., albuterol inhaler 2 puffs 4 times daily as needed, amlodipine 5 mg daily, aspirin 81 mg daily, Tessalon Perles t.i.d. as needed, Coreg 3.125 mg b.i.d., chlorpheniramine and dextromethorphan 1 tablet q. 6 hours as needed, Colace 100 mg b.i.d., fexofenadine 180 mg daily, add sublingual nitroglycerin as needed. Oxycodone 15 mg every 8 hours as needed, Spiriva Respimat 2 puffs daily, Flomax 0.4 mg daily, vitamin B12 at 2500 mcg sublingual daily.   PHYSICAL EXAMINATION: Presently is as follows:  VITAL SIGNS: Noted to be: 102, pulse 130, respirations 20, blood pressure 153/91, saturations 98% on a BiPAP.  GENERAL: He is a pleasant -appearing male in mild respiratory distress.  HEAD, EYES, EARS, NOSE AND THROAT: Atraumatic, normocephalic. Extraocular muscles are intact. Pupils are equal and reactive to light. Sclerae anicteric. No conjunctival injection. No pharyngeal erythema.  NECK: Supple. There is no jugular venous distention. No bruits, no lymphadenopathy, no thyromegaly.  HEART: Regular rate and rhythm, tachycardic. No murmurs, no rubs, no clicks.  LUNGS: He has some coarse rhonchi and crackles at the bases. Negative use of accessory muscle, dullness to percussion, no wheezing.  ABDOMEN: Soft, flat, nontender, nondistended. Has good bowel sounds. No hepatosplenomegaly appreciated.  EXTREMITIES: No evidence of any cyanosis or clubbing. He does have +1 to 2 pitting edema from the knees  to the ankles bilaterally, +2 pedal and radial pulses bilaterally.  NEUROLOGICAL: The patient is alert and oriented x 3 with no focal motor or sensory deficits appreciated bilaterally.  SKIN: Moist and warm  with no rashes.  LYMPHATIC: There is no cervical or axillary lymphadenopathy.   LABORATORY DATA: Serum glucose of 149, BUN 19, creatinine 1, sodium 139, potassium 3.7, chloride 101, bicarbonate 29. The patient's LFTs are within normal limits. Troponin less than 0.03. White cell count 16.8, hemoglobin 13.4, hematocrit 42.9, platelet count 198, INR 0.9. ABG showed a pH of 7.41, pCO2 of 46, pO2 of 79, saturations 95% on BiPAP.   IMAGING STUDIES: The patient did have a chest x-ray done, which showed a considerable new airspace opacity in the right midlung and right lung base, associated with pleural thickening, concerning for pneumonia.   ASSESSMENT AND PLAN: This is a 69 year old male with history of congestive heart failure, chronic obstructive pulmonary disease, hypertension, chronic pain, benign prostatic hypertrophy, history of constipation, who presents to the hospital with shortness of breath, fevers, chills, and noted to have sepsis secondary to pneumonia.  1. Sepsis. This is likely secondary to pneumonia. The patient presented with fever, tachycardia, and hypoxia and chest x-ray findings suggestive of pneumonia. For now, I will treat the patient with IV antibiotics with ceftriaxone and Zithromax for community-acquired pneumonia, follow blood and sputum cultures and follow the patient clinically. Use antipyretics for his fever.  2. Pneumonia. This was likely community-acquired pneumonia. We will treat the patient on IV ceftriaxone and Zithromax. Follow sputum and blood cultures, continue antitussives.  3. Acute respiratory failure with hypoxia. This is secondary to his pneumonia. Currently, he is on BiPAP, but he likely can be weaned off of that. We will place him on nasal cannula O2 for now. We will continue IV antibiotics for pneumonia. Follow him clinically.  4. Chronic obstructive pulmonary disease. There is no acute chronic obstructive pulmonary disease exacerbation. Continue his Advair, Spiriva,  and p.r.n. DuoNebs. Assess him for home oxygen prior to discharge.  5. Hypertension. Continue Coreg and Norvasc.  6. History of congestive heart failure. Clinically, the patient is not in congestive heart failure. I will continue his Coreg for now.  7. Benign prostatic hypertrophy. Continue Flomax.  8. Chronic pain. Continue oxycodone.   CODE STATUS: The patient is a full code.   TIME SPENT ON THE ADMISSION: 50 minutes     ____________________________ Rolly PancakeVivek J. Cherlynn KaiserSainani, MD vjs:mw D: 04/02/2015 10:57:16 ET T: 04/02/2015 11:15:14 ET JOB#: [id number]  cc: Rolly PancakeVivek J. Cherlynn KaiserSainani, MD, <Dictator> Houston SirenVIVEK J SAINANI MD ELECTRONICALLY SIGNED 04/14/2015 11:27

## 2015-04-25 NOTE — Discharge Summary (Signed)
PATIENT NAME:  Victor Daniels, Victor Daniels MR#:  [id number] DATE OF BIRTH:  [birth year]  DATE OF ADMISSION:  04/02/2015 DATE OF DISCHARGE:  04/04/2015  For details, please look at the history and physical done on admission.   DIAGNOSES AT DISCHARGE: 1. Acute respiratory failure with hypoxia secondary to pneumonia and chronic obstructive pulmonary disease exacerbation.  2. Pneumonia, likely community-acquired pneumonia.  3. Chronic obstructive pulmonary disease exacerbation.  4. Chronic pain.  5. History of congestive heart failure.  6. Benign prostatic hypertrophy.   DIET: The patient is being discharged on a low-sodium, low-fat diet.   ACTIVITY: As tolerated.   FOLLOWUP: With Dr. Duncan Dull in the next 1 to 2 weeks.   DISCHARGE MEDICATIONS: Albuterol inhaler 2 puffs 4 times daily as needed, aspirin 81 mg daily, Advair 500/50 one puff b.i.d. amlodipine 5 mg daily, Coreg 6.25 mg 1/2 tablet b.i.d., Colace 100 mg b.i.d. as needed, Tessalon Perles t.i.d. as needed, Allegra 180 mg daily, vitamin B12 at 2500 mcg sublingual daily, dextromethorphan chlorpheniramine 4/30 one tablet every 6 hours as needed, Spiriva Respimat 2 puffs daily, Flomax 0.4 mg daily, sublingual nitroglycerin as needed, oxycodone 50 mg q.8 h. as needed, prednisone taper starting at 30 mg down to 10 mg over the next 3 days, and Levaquin 750 mg daily x5 days.   PERTINENT STUDIES DONE DURING THE HOSPITAL COURSE: A chest x-ray done on admission showing considerable new airspace opacity in the right mid lung and right lung base associated with pleural thickening concerning for pneumonia. The patient's blood cultures noted to be negative.   HOSPITAL COURSE: This is a 69 year old male with a past medical history as mentioned above, presented to the hospital due to fever, chills, shortness of breath, and noted to have pneumonia.   1. Sepsis. This is likely secondary to pneumonia. The patient presented with fever, tachycardia, hypoxia and  chest x-ray findings suggestive of pneumonia. The patient was admitted to the hospital, started on IV ceftriaxone and Zithromax. Also given some IV fluids and antipyretics. After getting the IV antibiotics and fluids over the next 48 hours, the patient's clinical symptoms have improved. He is currently afebrile and hemodynamically stable. His blood cultures are negative; therefore, he is being discharged on oral Levaquin for treatment of his pneumonia.  2. Pneumonia, this was likely community-acquired pneumonia. The patient was treated with IV ceftriaxone and Zithromax in the hospital, and is currently being discharged on oral Levaquin. His blood cultures have remained negative.  3. Acute respiratory failure with hypoxia. This was secondary to the pneumonia. The patient was treated with IV antibiotics as mentioned. He was also assessed for home oxygen, which he did not qualify for. His hypoxia and shortness of breath has significantly improved, and, therefore, he is being discharged home on an oral prednisone taper and oral antibiotics.  4. COPD with acute exacerbation. This was likely secondary to pneumonia. The patient was placed on an oral prednisone taper while in the hospital. Also given IV antibiotics for his pneumonia, continued on his Advair and Spiriva. His bronchospasm and wheezing has improved; therefore, he is being discharged on finishing his prednisone taper and oral antibiotics and maintenance of his inhalers. As mentioned above, he did not qualify for home oxygen.  5. Hypertension. The patient remained hemodynamically stable. He will continue his Coreg and Norvasc.  6. History of congestive heart failure. Clinically, the patient was not in congestive heart failure while in the hospital. He will continue his Coreg. He is currently not on  diuretics.  7. BPH. The patient was maintained on his Flomax. He will resume that.  8. Chronic pain. The patient was maintained on his oxycodone. He will  resume that upon discharge.   CODE STATUS: The patient is a FULL CODE.   DISPOSITION: He is being discharged home.   TIME SPENT ON DISCHARGE: 40 minutes    ____________________________ Rolly PancakeVivek J. Cherlynn KaiserSainani, MD vjs:JT D: 04/04/2015 13:08:23 ET T: 04/04/2015 14:03:00 ET JOB#: [id number]  cc: Rolly PancakeVivek J. Cherlynn KaiserSainani, MD, <Dictator> Duncan Dulleresa Tullo, MD Houston SirenVIVEK J Rheba Diamond MD ELECTRONICALLY SIGNED 04/14/2015 11:27

## 2015-04-30 ENCOUNTER — Other Ambulatory Visit: Payer: Self-pay | Admitting: Internal Medicine

## 2015-05-05 ENCOUNTER — Encounter: Payer: Self-pay | Admitting: Internal Medicine

## 2015-05-10 ENCOUNTER — Ambulatory Visit (INDEPENDENT_AMBULATORY_CARE_PROVIDER_SITE_OTHER): Payer: Commercial Managed Care - HMO | Admitting: Internal Medicine

## 2015-05-10 ENCOUNTER — Encounter: Payer: Self-pay | Admitting: Internal Medicine

## 2015-05-10 VITALS — BP 158/88 | HR 80 | Temp 98.2°F | Resp 14 | Ht 74.0 in | Wt 278.5 lb

## 2015-05-10 DIAGNOSIS — M542 Cervicalgia: Secondary | ICD-10-CM

## 2015-05-10 DIAGNOSIS — I10 Essential (primary) hypertension: Secondary | ICD-10-CM

## 2015-05-10 DIAGNOSIS — I872 Venous insufficiency (chronic) (peripheral): Secondary | ICD-10-CM | POA: Diagnosis not present

## 2015-05-10 DIAGNOSIS — R0602 Shortness of breath: Secondary | ICD-10-CM | POA: Diagnosis not present

## 2015-05-10 MED ORDER — OXYCODONE HCL 15 MG PO TABS: 15.0000 mg | ORAL_TABLET | Freq: Three times a day (TID) | ORAL | Status: DC | PRN

## 2015-05-10 MED ORDER — ALBUTEROL SULFATE HFA 108 (90 BASE) MCG/ACT IN AERS: INHALATION_SPRAY | RESPIRATORY_TRACT | Status: DC

## 2015-05-10 MED ORDER — CARVEDILOL 6.25 MG PO TABS: 6.2500 mg | ORAL_TABLET | Freq: Two times a day (BID) | ORAL | Status: DC
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
## 2015-05-10 NOTE — Patient Instructions (Addendum)
Please increase your carvedilol to 6.25 mg every 12 hours (2 doses daily) for your blood pressure  If your BP is not < 140/80 after a week,  Let me know    I agree with starting the [facility]akers Program at the [facility] to improve your endurance   Your legs look much better since you started using the compression stockings  Don't let Rocky scratch your legs!!

## 2015-05-10 NOTE — Progress Notes (Signed)
Pre visit review using our clinic review tool, if applicable. No additional management support is needed unless otherwise documented below in the visit note. 

## 2015-05-11 ENCOUNTER — Encounter: Payer: Self-pay | Admitting: Internal Medicine

## 2015-05-11 NOTE — Assessment & Plan Note (Signed)
He has no interest in the Cardiopulmonary rehab program. Advised to consider the [employer] [facility]

## 2015-05-11 NOTE — Assessment & Plan Note (Signed)
With radiculopathy o right shoulder.  Patient is using  peercocet every 8 hours. For severe pain . Patient has not had an escalation of use of narcotics and is reminded not to share medication with others or to combine with alcohol or other sedating medications.  Patient is providing urine sample upon request per narcotics contract, Refill on narcotic was given today.

## 2015-05-11 NOTE — Assessment & Plan Note (Signed)
Elevated today  ,  Due to medication nonadherence,  increse carvedilol to 6 .25 mg twice daily

## 2015-05-11 NOTE — Assessment & Plan Note (Signed)
Improved with use of venous insufficiency,.  No open wounds,

## 2015-05-18 ENCOUNTER — Telehealth: Payer: Self-pay | Admitting: *Deleted

## 2015-05-18 NOTE — Telephone Encounter (Signed)
Pt called states his blood pressure is still elevated.  States this morning it was 173/101, yesterday and Sunday it was 176/93.  Please advise

## 2015-05-18 NOTE — Telephone Encounter (Signed)
Pt states this morning his pulse was 101, yesterday 96 and Sunday 85.  Pt states he is taking Carvedilol 6.25 mg BID.

## 2015-05-18 NOTE — Telephone Encounter (Signed)
Spoke with pt, advised of medication increase.  Pt states he has refills at the pharmacy.

## 2015-05-18 NOTE — Telephone Encounter (Signed)
I cannot adjust meds without knowing his pulse.  Please be advised that you should always get both,  And review his list of bp meds to confirm that he dose he is taking

## 2015-05-18 NOTE — Telephone Encounter (Signed)
HE CAN INCREASE THE CARVEDILOL TO 12.5 MG BID ,  DOES HE HAVE ENOUGH TO DO THAT FOR A WEEK?

## 2015-05-22 ENCOUNTER — Other Ambulatory Visit: Payer: Self-pay | Admitting: Internal Medicine

## 2015-05-27 ENCOUNTER — Encounter: Payer: Self-pay | Admitting: Internal Medicine

## 2015-05-27 MED ORDER — LOSARTAN POTASSIUM 50 MG PO TABS: 50.0000 mg | ORAL_TABLET | Freq: Every day | ORAL | Status: DC

## 2015-06-07 ENCOUNTER — Other Ambulatory Visit: Payer: Self-pay | Admitting: *Deleted

## 2015-06-07 MED ORDER — CARVEDILOL 12.5 MG PO TABS: 12.5000 mg | ORAL_TABLET | Freq: Two times a day (BID) | ORAL | Status: DC

## 2015-06-07 NOTE — Telephone Encounter (Signed)
Pharmacy sent fax, pt stating he is taking Carvedilol 6.25 mg 2 tabs bid, pharmacy requesting new Rx. Per 05/18/15 telephone note, pt was increased to 12 .5 mg bid by Dr. Darrick Huntsman. New Rx sent to pharmacy.

## 2015-06-14 ENCOUNTER — Encounter: Payer: Self-pay | Admitting: Internal Medicine

## 2015-06-14 MED ORDER — LOSARTAN POTASSIUM-HCTZ 100-25 MG PO TABS: 1.0000 | ORAL_TABLET | Freq: Every day | ORAL | Status: DC

## 2015-06-24 ENCOUNTER — Telehealth: Payer: Self-pay | Admitting: *Deleted

## 2015-06-24 DIAGNOSIS — R5383 Other fatigue: Secondary | ICD-10-CM

## 2015-06-24 DIAGNOSIS — I2510 Atherosclerotic heart disease of native coronary artery without angina pectoris: Secondary | ICD-10-CM

## 2015-06-24 DIAGNOSIS — D510 Vitamin B12 deficiency anemia due to intrinsic factor deficiency: Secondary | ICD-10-CM

## 2015-06-24 DIAGNOSIS — E559 Vitamin D deficiency, unspecified: Secondary | ICD-10-CM

## 2015-06-24 DIAGNOSIS — D509 Iron deficiency anemia, unspecified: Secondary | ICD-10-CM

## 2015-06-24 DIAGNOSIS — I2583 Coronary atherosclerosis due to lipid rich plaque: Principal | ICD-10-CM

## 2015-06-24 DIAGNOSIS — R42 Dizziness and giddiness: Secondary | ICD-10-CM

## 2015-06-24 NOTE — Addendum Note (Signed)
Addended by: Sherlene ShamsULLO, Uri Turnbough L on: 06/24/2015 05:06 PM   Modules accepted: Orders

## 2015-06-24 NOTE — Telephone Encounter (Signed)
Pt coming in tomorrow what labs and dx?  

## 2015-06-25 ENCOUNTER — Other Ambulatory Visit (INDEPENDENT_AMBULATORY_CARE_PROVIDER_SITE_OTHER): Payer: Commercial Managed Care - HMO

## 2015-06-25 DIAGNOSIS — I2510 Atherosclerotic heart disease of native coronary artery without angina pectoris: Secondary | ICD-10-CM | POA: Diagnosis not present

## 2015-06-25 DIAGNOSIS — I2583 Coronary atherosclerosis due to lipid rich plaque: Secondary | ICD-10-CM

## 2015-06-25 DIAGNOSIS — D509 Iron deficiency anemia, unspecified: Secondary | ICD-10-CM

## 2015-06-25 DIAGNOSIS — R5383 Other fatigue: Secondary | ICD-10-CM

## 2015-06-25 DIAGNOSIS — E559 Vitamin D deficiency, unspecified: Secondary | ICD-10-CM | POA: Diagnosis not present

## 2015-06-25 DIAGNOSIS — D510 Vitamin B12 deficiency anemia due to intrinsic factor deficiency: Secondary | ICD-10-CM | POA: Diagnosis not present

## 2015-06-25 LAB — COMPREHENSIVE METABOLIC PANEL
ALBUMIN: 3.7 g/dL (ref 3.5–5.2)
ALT: 13 U/L (ref 0–53)
AST: 12 U/L (ref 0–37)
Alkaline Phosphatase: 77 U/L (ref 39–117)
BUN: 20 mg/dL (ref 6–23)
CALCIUM: 9.2 mg/dL (ref 8.4–10.5)
CHLORIDE: 99 meq/L (ref 96–112)
CO2: 33 mEq/L — ABNORMAL HIGH (ref 19–32)
Creatinine, Ser: 1.17 mg/dL (ref 0.40–1.50)
GFR: 65.76 mL/min (ref >60.00)
GLUCOSE: 105 mg/dL — AB (ref 70–99)
Potassium: 3.8 mEq/L (ref 3.5–5.1)
SODIUM: 140 meq/L (ref 135–145)
TOTAL PROTEIN: 6.7 g/dL (ref 6.0–8.3)
Total Bilirubin: 0.5 mg/dL (ref 0.2–1.2)

## 2015-06-25 LAB — CBC WITH DIFFERENTIAL/PLATELET
BASOS PCT: 0.5 % (ref 0.0–3.0)
Basophils Absolute: 0 10*3/uL (ref 0.0–0.1)
Eosinophils Absolute: 0.3 10*3/uL (ref 0.0–0.7)
Eosinophils Relative: 4.3 % (ref 0.0–5.0)
HCT: 41.2 % (ref 39.0–52.0)
Hemoglobin: 13.6 g/dL (ref 13.0–17.0)
Lymphocytes Relative: 27.7 % (ref 12.0–46.0)
Lymphs Abs: 1.9 10*3/uL (ref 0.7–4.0)
MCHC: 33 g/dL (ref 30.0–36.0)
MCV: 84.1 fl (ref 78.0–100.0)
MONOS PCT: 11.5 % (ref 3.0–12.0)
Monocytes Absolute: 0.8 10*3/uL (ref 0.1–1.0)
Neutro Abs: 3.9 10*3/uL (ref 1.4–7.7)
Neutrophils Relative %: 56 % (ref 43.0–77.0)
PLATELETS: 181 10*3/uL (ref 150.0–400.0)
RBC: 4.9 Mil/uL (ref 4.22–5.81)
RDW: 16 % — ABNORMAL HIGH (ref 11.5–15.5)
WBC: 7 10*3/uL (ref 4.0–10.5)

## 2015-06-25 LAB — IRON AND TIBC
%SAT: 23 % (ref 20–55)
Iron: 68 ug/dL (ref 42–165)
TIBC: 293 ug/dL (ref 215–435)
UIBC: 225 ug/dL (ref 125–400)

## 2015-06-25 LAB — LIPID PANEL
CHOL/HDL RATIO: 3
Cholesterol: 196 mg/dL (ref 0–200)
HDL: 61.1 mg/dL (ref >39.00)
LDL Cholesterol: 115 mg/dL — ABNORMAL HIGH (ref 0–99)
NonHDL: 134.9
Triglycerides: 99 mg/dL (ref 0.0–149.0)
VLDL: 19.8 mg/dL (ref 0.0–40.0)

## 2015-06-25 LAB — FERRITIN: Ferritin: 37.4 ng/mL (ref 22.0–322.0)

## 2015-06-25 LAB — VITAMIN D 25 HYDROXY (VIT D DEFICIENCY, FRACTURES): VITD: 31.68 ng/mL (ref 30.00–100.00)

## 2015-06-25 LAB — VITAMIN B12: Vitamin B-12: 1050 pg/mL — ABNORMAL HIGH (ref 211–911)

## 2015-06-28 ENCOUNTER — Encounter: Payer: Self-pay | Admitting: Internal Medicine

## 2015-06-29 ENCOUNTER — Ambulatory Visit (INDEPENDENT_AMBULATORY_CARE_PROVIDER_SITE_OTHER): Payer: Commercial Managed Care - HMO | Admitting: Internal Medicine

## 2015-06-29 ENCOUNTER — Encounter: Payer: Self-pay | Admitting: Internal Medicine

## 2015-06-29 ENCOUNTER — Telehealth: Payer: Self-pay | Admitting: Internal Medicine

## 2015-06-29 VITALS — BP 139/78 | HR 72 | Temp 98.5°F | Ht 74.0 in | Wt 283.0 lb

## 2015-06-29 DIAGNOSIS — M542 Cervicalgia: Secondary | ICD-10-CM

## 2015-06-29 DIAGNOSIS — I2510 Atherosclerotic heart disease of native coronary artery without angina pectoris: Secondary | ICD-10-CM | POA: Diagnosis not present

## 2015-06-29 DIAGNOSIS — I208 Other forms of angina pectoris: Secondary | ICD-10-CM | POA: Diagnosis not present

## 2015-06-29 DIAGNOSIS — I2583 Coronary atherosclerosis due to lipid rich plaque: Principal | ICD-10-CM

## 2015-06-29 MED ORDER — OXYCODONE HCL ER 15 MG PO T12A: 15.0000 mg | EXTENDED_RELEASE_TABLET | Freq: Two times a day (BID) | ORAL | Status: DC

## 2015-06-29 MED ORDER — OXYCODONE HCL 15 MG PO TABS: 15.0000 mg | ORAL_TABLET | Freq: Three times a day (TID) | ORAL | Status: DC | PRN

## 2015-06-29 MED ORDER — NITROGLYCERIN 0.4 MG SL SUBL: 0.4000 mg | SUBLINGUAL_TABLET | SUBLINGUAL | Status: AC | PRN

## 2015-06-29 NOTE — Telephone Encounter (Signed)
Pt dropped off handicapped driver plate form to be filled out.. Placed in Dr.Tullos box.. Please advise pt when ready

## 2015-06-29 NOTE — Progress Notes (Signed)
Subjective:  Patient ID: Victor KempEdward R Pursley, male    DOB: Sep 27, 1946  Age: 69 y.o. MRN: [id number]  CC: The primary encounter diagnosis was Coronary artery disease due to lipid rich plaque. Diagnoses of Cervical spine pain and Angina at rest were also pertinent to this visit.  HPI Victor Kempdward R Bushnell presents for follow up on COPD, hypertension, CAD, and chronic neck pain due to DDD cervical spine and venous insufficiency   He has not been feeling well.  He has been having recurrent episodes of chest tightness,  Sometimes"knew what they were doing",  But he is willing to see Dr. Mariah MillingGollan again .   He has been having episodes of bilateral hand numbness and tingling.  He has noted Some loss of dexterity.  He  has persistent ,neck pain with recurrent sharp pain occurring in the parietal scalp area when he turns his head from side to side. He has a history of previous decompression of cercical spine  By Dr  Dutch QuintPoole 10 years ago (says it made things worse)  After recurrent trials of  ESI  By  Pain Clinic  did not help.    He was offered and MRI of cervical spine today but has decided against it "since I'm not going to do anything about it."  He is requesting an increase pr change in his narcotics to trrat his persistent pain bette.r  He is taking oxycodone every 8 hours and the medication is only providing 4 hours of transient moderate relief  He is never pain free.    He reports constant fatigue.  He continues to defere treatment of sleep apnea due to intolerance of CPAP .   Outpatient Prescriptions Prior to Visit  Medication Sig Dispense Refill  . albuterol (VENTOLIN HFA) 108 (90 BASE) MCG/ACT inhaler INHALE TWO PUFFS BY MOUTH EVERY 6 HOURS AS NEEDED FOR WHEEZING OR SHORTNESS OF BREATH 18 each 5  . amLODipine (NORVASC) 5 MG tablet Take 1 tablet (5 mg total) by mouth daily. 90 tablet 3  . aspirin 81 MG tablet Take 81 mg by mouth daily.    . benzonatate (TESSALON) 200 MG capsule Take 1 capsule  (200 mg total) by mouth 3 (three) times daily as needed for cough. 60 capsule 3  . carvedilol (COREG) 12.5 MG tablet Take 1 tablet (12.5 mg total) by mouth 2 (two) times daily with a meal. 60 tablet 5  . cyanocobalamin 1000 MCG tablet Take 100 mcg by mouth daily.    Marland Kitchen. ipratropium (ATROVENT) 0.03 % nasal spray Place 2 sprays into both nostrils every 12 (twelve) hours. 30 mL 12  . losartan-hydrochlorothiazide (HYZAAR) 100-25 MG per tablet Take 1 tablet by mouth daily. 90 tablet 3  . montelukast (SINGULAIR) 10 MG tablet Take 10 mg by mouth at bedtime.    . nitroGLYCERIN (NITROSTAT) 0.4 MG SL tablet Place 1 tablet (0.4 mg total) under the tongue every 5 (five) minutes as needed for chest pain. 50 tablet 3  . phenylephrine (SUDAFED PE) 10 MG TABS tablet Take 10 mg by mouth every 6 (six) hours as needed.    Marland Kitchen. SPIRIVA RESPIMAT 2.5 MCG/ACT AERS INHALE TWO SPRAY(S) BY MOUTH ONCE DAILY 4 g 3  . losartan (COZAAR) 50 MG tablet Take 1 tablet (50 mg total) by mouth daily. 90 tablet 3  . oxyCODONE (ROXICODONE) 15 MG immediate release tablet Take 1 tablet (15 mg total) by mouth every 8 (eight) hours as needed for pain. 90 tablet 0  . tamsulosin (FLOMAX)  0.4 MG CAPS capsule Take 1 capsule (0.4 mg total) by mouth daily after supper. 30 capsule 5   No facility-administered medications prior to visit.    Review of Systems;  Patient denies headache, fevers, malaise, unintentional weight loss, skin rash, eye pain, sinus congestion and sinus pain, sore throat, dysphagia,  hemoptysis , cough, dyspnea, wheezing, chest pain, palpitations, orthopnea, edema, abdominal pain, nausea, melena, diarrhea, constipation, flank pain, dysuria, hematuria, urinary  Frequency, nocturia, numbness, tingling, seizures,  Focal weakness, Loss of consciousness,  Tremor, insomnia, depression, anxiety, and suicidal ideation.      Objective:  BP 139/78 mmHg  Pulse 72  Temp(Src) 98.5 F (36.9 C) (Oral)  Ht 6\' 2"  (1.88 m)  Wt 283 lb  (128.368 kg)  BMI 36.32 kg/m2  SpO2 95%  BP Readings from Last 3 Encounters:  06/29/15 139/78  05/10/15 158/88  04/12/15 148/80    Wt Readings from Last 3 Encounters:  06/29/15 283 lb (128.368 kg)  05/10/15 278 lb 8 oz (126.327 kg)  04/12/15 278 lb 4 oz (126.213 kg)    General appearance: alert, cooperative and appears stated age Ears: normal TM's and external ear canals both ears Throat: lips, mucosa, and tongue normal; teeth and gums normal Neck: no adenopathy, no carotid bruit, supple, symmetrical, trachea midline and thyroid not enlarged, symmetric, no tenderness/mass/nodules Back: symmetric, no curvature. ROM normal. No CVA tenderness. Lungs: clear to auscultation bilaterally Heart: regular rate and rhythm, S1, S2 normal, no murmur, click, rub or gallop Abdomen: soft, non-tender; bowel sounds normal; no masses,  no organomegaly Pulses: 2+ and symmetric Skin: Skin color, texture, turgor normal. No rashes or lesions Lymph nodes: Cervical, supraclavicular, and axillary nodes normal.  Lab Results  Component Value Date   HGBA1C 6.3 08/20/2014   HGBA1C 6.1 03/24/2014   HGBA1C 6.2 12/11/2013    Lab Results  Component Value Date   CREATININE 1.17 06/25/2015   CREATININE 1.00 04/03/2015   CREATININE 1.00 04/02/2015    Lab Results  Component Value Date   WBC 7.0 06/25/2015   HGB 13.6 06/25/2015   HCT 41.2 06/25/2015   PLT 181.0 06/25/2015   GLUCOSE 105* 06/25/2015   CHOL 196 06/25/2015   TRIG 99.0 06/25/2015   HDL 61.10 06/25/2015   LDLCALC 115* 06/25/2015   ALT 13 06/25/2015   AST 12 06/25/2015   NA 140 06/25/2015   K 3.8 06/25/2015   CL 99 06/25/2015   CREATININE 1.17 06/25/2015   BUN 20 06/25/2015   CO2 33* 06/25/2015   TSH 1.67 12/24/2014   INR 0.9 04/02/2015   HGBA1C 6.3 08/20/2014    No results found.  Assessment & Plan:   Problem List Items Addressed This Visit      Unprioritized   CAD (coronary artery disease) - Primary    With recurrent  episodes of chest pain concerning for angina at rest.  NTG prescribed,  appt made with Dr. Mariah Milling for repeat evaluation.       Relevant Medications   nitroGLYCERIN (NITROSTAT) 0.4 MG SL tablet   Other Relevant Orders   Ambulatory referral to Cardiology   Cervical spine pain    With radiculopathy,  Prior decompressive surgery.  He refuses MRI , neurosurgical evaluation and Pain Clinic referral since prior ESIs did not help.  His pain is not controlled with oxycodone 15 mg every 8 hours due to breakthrough pain, . Adding Oxycontin 15 mg every 12 hours for long acting relief and continue 15 mg immediate release for breakthrough pain.  Other Visit Diagnoses    Angina at rest        Relevant Medications    nitroGLYCERIN (NITROSTAT) 0.4 MG SL tablet    Other Relevant Orders    Ambulatory referral to Cardiology     A total of 25 minutes of face to face time was spent with patient more than half of which was spent in counselling about the above mentioned conditions  and coordination of care   I have discontinued Mr. Stejskal tamsulosin and losartan. I am also having him start on OxyCODONE and nitroGLYCERIN. Additionally, I am having him maintain his nitroGLYCERIN, cyanocobalamin, aspirin, montelukast, amLODipine, phenylephrine, benzonatate, ipratropium, albuterol, SPIRIVA RESPIMAT, carvedilol, losartan-hydrochlorothiazide, and oxyCODONE.  Meds ordered this encounter  Medications  . oxyCODONE (ROXICODONE) 15 MG immediate release tablet    Sig: Take 1 tablet (15 mg total) by mouth every 8 (eight) hours as needed for pain.    Dispense:  90 tablet    Refill:  0  . OxyCODONE (OXYCONTIN) 15 mg T12A 12 hr tablet    Sig: Take 1 tablet (15 mg total) by mouth every 12 (twelve) hours.    Dispense:  60 tablet    Refill:  0  . nitroGLYCERIN (NITROSTAT) 0.4 MG SL tablet    Sig: Place 1 tablet (0.4 mg total) under the tongue every 5 (five) minutes as needed for chest pain. Maximum dose 3  tablets    Dispense:  50 tablet    Refill:  3    Medications Discontinued During This Encounter  Medication Reason  . tamsulosin (FLOMAX) 0.4 MG CAPS capsule Patient Preference  . losartan (COZAAR) 50 MG tablet Change in therapy  . oxyCODONE (ROXICODONE) 15 MG immediate release tablet Reorder    Follow-up: No Follow-up on file.   [name], MD

## 2015-06-29 NOTE — Patient Instructions (Addendum)
I am prescribing oxycodone ("oxycontin") to use every 12 hours  You can continue using the oxycodone in between for breakthrough pain   You will need to use dulcolax or sennakot s  Daily to prevent constipation   MRi  Cervical spine to be done  Referral to Dr Mariah MillingGollan for evaluation  of angina  Please use your nitrolgycerin when you get chest  tightness

## 2015-06-29 NOTE — Progress Notes (Signed)
Pre visit review using our clinic review tool, if applicable. No additional management support is needed unless otherwise documented below in the visit note. 

## 2015-06-30 NOTE — Telephone Encounter (Signed)
Form signed by physician and mailed to the pt as requested

## 2015-07-01 NOTE — Assessment & Plan Note (Signed)
With radiculopathy,  Prior decompressive surgery.  He refuses MRI , neurosurgical evaluation and Pain Clinic referral since prior ESIs did not help.  His pain is not controlled with oxycodone 15 mg every 8 hours due to breakthrough pain, . Adding Oxycontin 15 mg every 12 hours for long acting relief and continue 15 mg immediate release for breakthrough pain.

## 2015-07-01 NOTE — Assessment & Plan Note (Signed)
With recurrent episodes of chest pain concerning for angina at rest.  NTG prescribed,  appt made with Dr. Mariah MillingGollan for repeat evaluation.

## 2015-07-05 ENCOUNTER — Encounter: Payer: Self-pay | Admitting: Internal Medicine

## 2015-07-12 ENCOUNTER — Telehealth: Payer: Self-pay | Admitting: Internal Medicine

## 2015-07-12 NOTE — Telephone Encounter (Signed)
PA for OxyContin has been started on cover my meds. FYI

## 2015-07-14 ENCOUNTER — Telehealth: Payer: Self-pay | Admitting: Cardiovascular Disease

## 2015-07-14 NOTE — Telephone Encounter (Signed)
PCP wants patient seen for Angina at rest.  Gollan 1st available is 09-02-15 .  Put patient on waiting list.  Any other suggestions?  Thanks!

## 2015-07-20 ENCOUNTER — Encounter: Payer: Self-pay | Admitting: Cardiovascular Disease

## 2015-07-20 ENCOUNTER — Ambulatory Visit (INDEPENDENT_AMBULATORY_CARE_PROVIDER_SITE_OTHER): Payer: Commercial Managed Care - HMO | Admitting: Cardiovascular Disease

## 2015-07-20 VITALS — BP 112/82 | HR 69 | Ht 72.0 in | Wt 286.5 lb

## 2015-07-20 DIAGNOSIS — R0602 Shortness of breath: Secondary | ICD-10-CM

## 2015-07-20 DIAGNOSIS — I2510 Atherosclerotic heart disease of native coronary artery without angina pectoris: Secondary | ICD-10-CM | POA: Diagnosis not present

## 2015-07-20 DIAGNOSIS — I10 Essential (primary) hypertension: Secondary | ICD-10-CM

## 2015-07-20 DIAGNOSIS — I2583 Coronary atherosclerosis due to lipid rich plaque: Secondary | ICD-10-CM

## 2015-07-20 DIAGNOSIS — R0789 Other chest pain: Secondary | ICD-10-CM | POA: Diagnosis not present

## 2015-07-20 DIAGNOSIS — E785 Hyperlipidemia, unspecified: Secondary | ICD-10-CM

## 2015-07-20 DIAGNOSIS — R079 Chest pain, unspecified: Secondary | ICD-10-CM

## 2015-07-20 DIAGNOSIS — I872 Venous insufficiency (chronic) (peripheral): Secondary | ICD-10-CM

## 2015-07-20 DIAGNOSIS — I5030 Unspecified diastolic (congestive) heart failure: Secondary | ICD-10-CM | POA: Diagnosis not present

## 2015-07-20 MED ORDER — SIMVASTATIN 20 MG PO TABS: 20.0000 mg | ORAL_TABLET | Freq: Every day | ORAL | Status: DC

## 2015-07-20 NOTE — Assessment & Plan Note (Signed)
Atypical chest pain. Discussed his symptoms in detail with him. As symptoms are presenting at rest, not with exertion, atypical in nature, recommended we monitor his symptoms for now.  No change on EKG. Recommended he start his exercise program. He has joined a gym If he has worsening symptoms of chest discomfort with exertion, occluded he call our office for testing He would like a treadmill stress Myoview test rather than a pharmacologic Myoview He is hoping to avoid a catheterization

## 2015-07-20 NOTE — Assessment & Plan Note (Signed)
Atypical chest pain as above He will contact us if symptoms persist. Treadmill Myoview could be ordered at that time. He declined on today's visit

## 2015-07-20 NOTE — Assessment & Plan Note (Signed)
Appears relatively euvolemic. Prior echocardiogram with normal ejection fraction Symptoms improved after he stopped drinking alcohol

## 2015-07-20 NOTE — Assessment & Plan Note (Signed)
Previously on simvastatin 20 minute grams daily. Cholesterol was well controlled on this. He does not know where this will off the list. We will restart this for him today. Prescription sent in

## 2015-07-20 NOTE — Assessment & Plan Note (Signed)
Chronic shortness of breath. Likely exacerbated too long smoking history, obesity, deconditioning Unable to exclude ischemia. If symptoms get worse, will start ischemia workup

## 2015-07-20 NOTE — Assessment & Plan Note (Signed)
Encouraged him to continue wearing his compression hose
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
## 2015-07-20 NOTE — Assessment & Plan Note (Signed)
Blood pressure is well controlled on today's visit. No changes made to the medications. 

## 2015-07-20 NOTE — Patient Instructions (Addendum)
You are doing well.  Please restart simvastatin one a day  Please call the office if you have more chest pain with exertion We would order a stress or cardiac cath  Please call us if you have new issues that need to be addressed before your next appt.  Your physician wants you to follow-up in: 6 months.  You will receive a reminder letter in the mail two months in advance. If you don't receive a letter, please call our office to schedule the follow-up appointment.

## 2015-07-21 ENCOUNTER — Other Ambulatory Visit: Payer: Self-pay

## 2015-07-21 MED ORDER — IPRATROPIUM BROMIDE 0.03 % NA SOLN: 2.0000 | Freq: Two times a day (BID) | NASAL | Status: DC

## 2015-07-21 MED ORDER — LOSARTAN POTASSIUM-HCTZ 100-25 MG PO TABS: 1.0000 | ORAL_TABLET | Freq: Every day | ORAL | Status: DC

## 2015-07-23 ENCOUNTER — Telehealth: Payer: Self-pay | Admitting: Internal Medicine

## 2015-07-23 DIAGNOSIS — M25511 Pain in right shoulder: Secondary | ICD-10-CM

## 2015-07-23 DIAGNOSIS — M25512 Pain in left shoulder: Secondary | ICD-10-CM

## 2015-07-23 NOTE — Telephone Encounter (Signed)
Pt was seen by Dr. Mariah Milling and request a two opinon. Pt also stated that is will need another referral to Dr. Darrel Reach

## 2015-07-23 NOTE — Telephone Encounter (Signed)
Please advise 

## 2015-07-25 ENCOUNTER — Encounter: Payer: Self-pay | Admitting: Internal Medicine

## 2015-07-25 DIAGNOSIS — M25511 Pain in right shoulder: Secondary | ICD-10-CM

## 2015-07-26 NOTE — Telephone Encounter (Signed)
Referral to Dr Shela Nevin is in process.  I read Dr Windell Hummingbird note. He stated that Mr Victor Daniels  Can call his office any time  to arrange a treadmill stress test if he develops more chest pain

## 2015-07-26 NOTE — Telephone Encounter (Signed)
Patient has changed his mind about cardiology but does need referral for Right shoulder pain to Dr. Martha Clan.

## 2015-07-26 NOTE — Telephone Encounter (Signed)
Is he Ok with referral to Dr Lady Gary at [facility] for the cardiology?  And the referral to Dr Martha Clan is for which joint?

## 2015-07-27 ENCOUNTER — Telehealth: Payer: Self-pay

## 2015-07-27 ENCOUNTER — Other Ambulatory Visit: Payer: Self-pay

## 2015-07-27 MED ORDER — AMLODIPINE BESYLATE 5 MG PO TABS: 5.0000 mg | ORAL_TABLET | Freq: Every day | ORAL | Status: DC

## 2015-07-27 NOTE — Telephone Encounter (Signed)
Pt was called about which pharmacy he wanted the prescription sent to. He had [facility] for this prescription, but Francine Graven is sending a new Rx form for this medication.

## 2015-07-27 NOTE — Telephone Encounter (Signed)
Pt was called about where he wanted his medications sent. The question was to [facility].

## 2015-07-27 NOTE — Telephone Encounter (Signed)
Pt want his medication sent to [facility] mail order.

## 2015-07-27 NOTE — Telephone Encounter (Signed)
[facility] pharmacy has faxed a physician new rx request form for carvedilol which was refilled by becky on 06/07/15 with 5 refills, losartan- hydrochlorothiazide which was refilled by Tanya on 07/21/15 and gave 3 refills. Please advise on how'd you would like to proceed.

## 2015-07-28 MED ORDER — LOSARTAN POTASSIUM-HCTZ 100-25 MG PO TABS: 1.0000 | ORAL_TABLET | Freq: Every day | ORAL | Status: DC

## 2015-07-28 MED ORDER — CARVEDILOL 12.5 MG PO TABS: 12.5000 mg | ORAL_TABLET | Freq: Two times a day (BID) | ORAL | Status: DC

## 2015-07-28 NOTE — Telephone Encounter (Signed)
Ok to refill,  Refill sent TO [facility]

## 2015-07-29 ENCOUNTER — Other Ambulatory Visit: Payer: Self-pay

## 2015-07-29 MED ORDER — LOSARTAN POTASSIUM-HCTZ 100-25 MG PO TABS: 1.0000 | ORAL_TABLET | Freq: Every day | ORAL | Status: DC

## 2015-07-29 MED ORDER — AMLODIPINE BESYLATE 5 MG PO TABS: 5.0000 mg | ORAL_TABLET | Freq: Every day | ORAL | Status: DC

## 2015-07-29 MED ORDER — CARVEDILOL 12.5 MG PO TABS: 12.5000 mg | ORAL_TABLET | Freq: Two times a day (BID) | ORAL | Status: DC

## 2015-07-29 NOTE — Telephone Encounter (Signed)
Pt sent in a prescription request but [facility] had already faxed over medication request and the refills were faxed back. So the ones sent electronically are not going to be used. If any other questions are needed they will call.

## 2015-08-03 DIAGNOSIS — M25512 Pain in left shoulder: Secondary | ICD-10-CM | POA: Insufficient documentation

## 2015-08-03 NOTE — Telephone Encounter (Signed)
Chart updated with patiebt's deferral of Ortho appt

## 2015-08-03 NOTE — Telephone Encounter (Signed)
-----   Message from Mordecai Maes sent at 08/02/2015  2:18 PM EDT ----- Regarding: referral Dr. Darrick Huntsman, I had scheduled Ramon Dredge to see Dr. Martha Clan for this Friday Aug 12 but he does not want the appt. I have called and cxled. I will update the referral as well. Thanks, [employer]

## 2015-09-02 ENCOUNTER — Ambulatory Visit: Payer: Commercial Managed Care - HMO | Admitting: Cardiovascular Disease

## 2015-09-02 ENCOUNTER — Telehealth: Payer: Self-pay | Admitting: *Deleted

## 2015-09-02 MED ORDER — OXYCODONE HCL ER 15 MG PO T12A: 15.0000 mg | EXTENDED_RELEASE_TABLET | Freq: Two times a day (BID) | ORAL | Status: DC

## 2015-09-02 NOTE — Telephone Encounter (Signed)
Pt called requesting Oxycodone .  Last OV 7.5.16.  Please advise refill.

## 2015-09-02 NOTE — Telephone Encounter (Signed)
Ok to refill,  printed rx  

## 2015-09-02 NOTE — Telephone Encounter (Signed)
Notified patient script ready for pick up placed at front desk by Nitchia.

## 2015-09-06 ENCOUNTER — Other Ambulatory Visit: Payer: Self-pay | Admitting: *Deleted

## 2015-09-06 MED ORDER — OXYCODONE HCL 15 MG PO TABS: 15.0000 mg | ORAL_TABLET | Freq: Three times a day (TID) | ORAL | Status: DC | PRN

## 2015-09-06 NOTE — Progress Notes (Signed)
12 hour Oxy cost 90 and the IR patient can get for 9 dollars.

## 2015-10-06 ENCOUNTER — Telehealth: Payer: Self-pay | Admitting: *Deleted

## 2015-10-06 NOTE — Telephone Encounter (Signed)
Patient dropped off a form to be filled out, for a parking placard. Forms are in the box.

## 2015-10-07 NOTE — Telephone Encounter (Signed)
Forms ready for pick up and patient notified placed at front desk.

## 2015-10-25 ENCOUNTER — Other Ambulatory Visit: Payer: Self-pay | Admitting: *Deleted

## 2015-10-25 NOTE — Telephone Encounter (Signed)
Patient calling for refill on Oxycodone IR  Last fill 09/06/15 ok to fill?

## 2015-10-25 NOTE — Telephone Encounter (Signed)
Patient has requested a medication refill for his fast acting oxycodone .

## 2015-10-25 NOTE — Telephone Encounter (Signed)
Left message for patient to return call to office patient last filled the Oxycodone IR need to verify which.

## 2015-10-27 MED ORDER — OXYCODONE HCL 15 MG PO TABS: 15.0000 mg | ORAL_TABLET | Freq: Three times a day (TID) | ORAL | Status: DC | PRN

## 2015-10-27 NOTE — Telephone Encounter (Signed)
Ok to refill,  printed rx  

## 2015-10-27 NOTE — Telephone Encounter (Signed)
Called patient to let him know RX is up front for him to pick up.

## 2015-10-28 ENCOUNTER — Telehealth: Payer: Self-pay | Admitting: Internal Medicine

## 2015-10-28 NOTE — Telephone Encounter (Signed)
Pt came in stating that he needs a refill on  albuterol (VENTOLIN HFA) 108 (90 BASE) MCG/ACT inhaler..please advise pt..Marland Kitchen

## 2015-10-28 NOTE — Telephone Encounter (Signed)
Rx at pharmacy already. Per pharmacy, pt was using a old Rx number when requesting refills.

## 2015-12-13 ENCOUNTER — Encounter: Payer: Self-pay | Admitting: Family Medicine

## 2015-12-13 ENCOUNTER — Ambulatory Visit (INDEPENDENT_AMBULATORY_CARE_PROVIDER_SITE_OTHER): Payer: Commercial Managed Care - HMO | Admitting: Family Medicine

## 2015-12-13 VITALS — BP 132/80 | HR 70 | Temp 98.1°F | Ht 72.0 in | Wt 300.0 lb

## 2015-12-13 DIAGNOSIS — J029 Acute pharyngitis, unspecified: Secondary | ICD-10-CM

## 2015-12-13 LAB — POCT RAPID STREP A (OFFICE): Rapid Strep A Screen: NEGATIVE

## 2015-12-13 NOTE — Progress Notes (Signed)
   Subjective:  Patient ID: Victor Daniels, male    DOB: 1946-12-07  Age: 69 y.o. MRN: [id number]  CC: Sore throat  HPI:  69 year old male with a collocated past medical history including CHF, CAD, COPD presents to clinic today for an acute visit with complaints of sore throat.  Sore Throat  Patient reports that he developed sore throat this past Friday.  He states that it is severe and makes swallowing difficult.  He's been using lozenges and over-the-counter Tylenol as well as OTC cold medications with no improvement.  No associated fever, chills. He denies any shortness of breath. No reports of cough.  No known exacerbating factors.  Social Hx   Social History   Social History  . Marital Status: Married    Spouse Name: N/A  . Number of Children: 0  . Years of Education: N/A   Occupational History  . Retired     Landscape architectTraveling Salesman   Social History Main Topics  . Smoking status: Former Smoker -- 2.00 packs/day for 25 years    Types: Cigarettes    Quit date: 09/26/2009  . Smokeless tobacco: Never Used  . Alcohol Use: No  . Drug Use: No  . Sexual Activity: Not Asked   Other Topics Concern  . None   Social History Narrative   Review of Systems  Constitutional: Negative for fever.  HENT: Positive for sore throat.   Respiratory: Negative for cough and shortness of breath.    Objective:  BP 132/80 mmHg  Pulse 70  Temp(Src) 98.1 F (36.7 C) (Oral)  Ht 6' (1.829 m)  Wt 300 lb (136.079 kg)  BMI 40.68 kg/m2  SpO2 93%  BP/Weight 12/13/2015 07/20/2015 06/29/2015  Systolic BP 132 112 139  Diastolic BP 80 82 78  Wt. (Lbs) 300 286.5 283  BMI 40.68 38.85 36.32    Physical Exam  Constitutional:  Obese elderly male in no acute distress.  HENT:  Head: Normocephalic and atraumatic.  Oropharynx with mild erythema. No exudate.  Eyes: Conjunctivae are normal.  Neck: Neck supple.  Cardiovascular: Normal rate and regular rhythm.   Pulmonary/Chest: Effort  normal and breath sounds normal.  Lymphadenopathy:    He has no cervical adenopathy.  Neurological: He is alert.  Psychiatric: He has a normal mood and affect.  Vitals reviewed.  Lab Results  Component Value Date   WBC 7.0 06/25/2015   HGB 13.6 06/25/2015   HCT 41.2 06/25/2015   PLT 181.0 06/25/2015   GLUCOSE 105* 06/25/2015   CHOL 196 06/25/2015   TRIG 99.0 06/25/2015   HDL 61.10 06/25/2015   LDLCALC 115* 06/25/2015   ALT 13 06/25/2015   AST 12 06/25/2015   NA 140 06/25/2015   K 3.8 06/25/2015   CL 99 06/25/2015   CREATININE 1.17 06/25/2015   BUN 20 06/25/2015   CO2 33* 06/25/2015   TSH 1.67 12/24/2014   INR 0.9 04/02/2015   HGBA1C 6.3 08/20/2014   Assessment & Plan:   Problem List Items Addressed This Visit    Sore throat - Primary    New problem. Rapid strep negative today. This is likely viral in etiology. Advised supportive care and PRN Tylenol for pain.       Relevant Orders   POCT rapid strep A (Completed)     Follow-up: PRN  Everlene OtherJayce Whitnee Orzel DO [facility] [place]

## 2015-12-13 NOTE — Progress Notes (Signed)
Pre visit review using our clinic review tool, if applicable. No additional management support is needed unless otherwise documented below in the visit note. 

## 2015-12-13 NOTE — Patient Instructions (Signed)
Your strep test was negative.  Continue supportive care and OTC Tylenol.  Call your PCP if you worsen.  Take care  Dr. Adriana Simasook

## 2015-12-13 NOTE — Assessment & Plan Note (Signed)
New problem. Rapid strep negative today. This is likely viral in etiology. Advised supportive care and PRN Tylenol for pain.

## 2015-12-23 ENCOUNTER — Other Ambulatory Visit: Payer: Self-pay | Admitting: Internal Medicine

## 2015-12-23 NOTE — Telephone Encounter (Signed)
Pt called needing a refill for medication oxyCODONE (ROXICODONE) 15 MG immediate release tablet. Please call when ready to pick up.Thank You!

## 2015-12-23 NOTE — Telephone Encounter (Signed)
Pt requests medication refill Last seen by PCP 06/29/15, seen for an acute visit on 12/13/15 Last ordered 10-27-15

## 2015-12-24 ENCOUNTER — Telehealth: Payer: Self-pay | Admitting: Internal Medicine

## 2015-12-24 MED ORDER — OXYCODONE HCL 15 MG PO TABS: 15.0000 mg | ORAL_TABLET | Freq: Three times a day (TID) | ORAL | Status: DC | PRN

## 2015-12-24 NOTE — Telephone Encounter (Signed)
Pt came into pick up a prescription.Victor Daniels. And wanted to know if Dr. [name] wanted to see him for an appt but didn't want to schedule one unless it was necessary.. Please advise pt

## 2015-12-24 NOTE — Telephone Encounter (Signed)
Ok to refill,  printed rx  

## 2015-12-24 NOTE — Telephone Encounter (Signed)
Notified patient Rx is ready to be picked up.  Placed in envelope in front office.

## 2015-12-24 NOTE — Telephone Encounter (Signed)
No note about an appointment needed.

## 2015-12-30 ENCOUNTER — Other Ambulatory Visit: Payer: Self-pay | Admitting: Internal Medicine

## 2016-01-20 ENCOUNTER — Telehealth: Payer: Self-pay | Admitting: Cardiovascular Disease

## 2016-01-20 NOTE — Telephone Encounter (Signed)
Attempted to schedule appt from recall list.  Patient does not want to be seen at this time .  Will call if needed in the future.  Deleting recall.

## 2016-02-03 ENCOUNTER — Encounter: Payer: Self-pay | Admitting: Cardiovascular Disease

## 2016-02-03 ENCOUNTER — Ambulatory Visit (INDEPENDENT_AMBULATORY_CARE_PROVIDER_SITE_OTHER): Payer: Commercial Managed Care - HMO | Admitting: Cardiovascular Disease

## 2016-02-03 VITALS — BP 130/78 | HR 70 | Ht 72.0 in | Wt 300.0 lb

## 2016-02-03 DIAGNOSIS — R5383 Other fatigue: Secondary | ICD-10-CM

## 2016-02-03 DIAGNOSIS — I2510 Atherosclerotic heart disease of native coronary artery without angina pectoris: Secondary | ICD-10-CM

## 2016-02-03 DIAGNOSIS — J449 Chronic obstructive pulmonary disease, unspecified: Secondary | ICD-10-CM

## 2016-02-03 DIAGNOSIS — I10 Essential (primary) hypertension: Secondary | ICD-10-CM | POA: Diagnosis not present

## 2016-02-03 DIAGNOSIS — I5030 Unspecified diastolic (congestive) heart failure: Secondary | ICD-10-CM | POA: Diagnosis not present

## 2016-02-03 DIAGNOSIS — R0602 Shortness of breath: Secondary | ICD-10-CM

## 2016-02-03 DIAGNOSIS — Z905 Acquired absence of kidney: Secondary | ICD-10-CM

## 2016-02-03 DIAGNOSIS — I2583 Coronary atherosclerosis due to lipid rich plaque: Principal | ICD-10-CM

## 2016-02-03 DIAGNOSIS — E785 Hyperlipidemia, unspecified: Secondary | ICD-10-CM

## 2016-02-03 MED ORDER — POTASSIUM CHLORIDE ER 10 MEQ PO TBCR: 10.0000 meq | EXTENDED_RELEASE_TABLET | Freq: Every day | ORAL | Status: DC | PRN

## 2016-02-03 MED ORDER — FUROSEMIDE 20 MG PO TABS: 20.0000 mg | ORAL_TABLET | Freq: Every day | ORAL | Status: DC | PRN

## 2016-02-03 NOTE — Assessment & Plan Note (Signed)
Blood pressure is well controlled on today's visit. No changes made to the medications. 

## 2016-02-03 NOTE — Assessment & Plan Note (Signed)
History of chronic fatigue, worsening symptoms, spends much of his time lounging around the house, strongly recommended he become more active, start a weight loss program, weight is up 25 pounds in the past 2 years

## 2016-02-03 NOTE — Patient Instructions (Addendum)
You are doing well.  Please start lasix/furosemide every other day with potassium every other day   We will refer you to pulmonary  Date & time: ____________________________  Please call us if you have new issues that need to be addressed before your next appt.  Your physician wants you to follow-up in: 6 months.  You will receive a reminder letter in the mail two months in advance. If you don't receive a letter, please call our office to schedule the follow-up appointment.
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
## 2016-02-03 NOTE — Assessment & Plan Note (Signed)
Worsening shortness of breath Likely multifactorial including deconditioning, weight gain, COPD, unable to exclude pulmonary hypertension/diastolic CHF. He does not want any further testing to help [place] facilitate a workup. We will start Lasix with low-dose potassium every other day for possible diastolic CHF, referral to pulmonary. Ideally we would order echocardiogram to evaluate right heart pressures but he has declined

## 2016-02-03 NOTE — Assessment & Plan Note (Signed)
Encouraged him to stay on his simvastatin, goal LDL less than 70   Total encounter time more than 25 minutes  Greater than 50% was spent in counseling and coordination of care with the patient

## 2016-02-03 NOTE — Assessment & Plan Note (Signed)
Suspect he has a component of diastolic CHF or pulmonary hypertension given his severe underlying lung disease and shortness of breath symptoms. Will proceed with diuresis for a cautiously given single kidney. He is trying to establish follow-up with Dr. Darrick Huntsman in the next several weeks. If possible, we have recommended he have a basic metabolic panel

## 2016-02-06 ENCOUNTER — Encounter: Payer: Self-pay | Admitting: Cardiovascular Disease

## 2016-02-20 ENCOUNTER — Encounter: Payer: Self-pay | Admitting: Cardiovascular Disease

## 2016-02-23 ENCOUNTER — Ambulatory Visit (INDEPENDENT_AMBULATORY_CARE_PROVIDER_SITE_OTHER): Payer: Commercial Managed Care - HMO

## 2016-02-23 VITALS — BP 130/80 | HR 76 | Temp 98.1°F | Resp 16 | Ht 72.0 in | Wt 297.4 lb

## 2016-02-23 DIAGNOSIS — Z0000 Encounter for general adult medical examination without abnormal findings: Secondary | ICD-10-CM

## 2016-02-23 DIAGNOSIS — Z23 Encounter for immunization: Secondary | ICD-10-CM

## 2016-02-23 DIAGNOSIS — Z1159 Encounter for screening for other viral diseases: Secondary | ICD-10-CM

## 2016-02-23 DIAGNOSIS — E559 Vitamin D deficiency, unspecified: Secondary | ICD-10-CM

## 2016-02-23 LAB — CBC WITH DIFFERENTIAL/PLATELET
BASOS PCT: 0.6 % (ref 0.0–3.0)
Basophils Absolute: 0 10*3/uL (ref 0.0–0.1)
EOS PCT: 7.1 % — AB (ref 0.0–5.0)
Eosinophils Absolute: 0.5 10*3/uL (ref 0.0–0.7)
HCT: 37.6 % — ABNORMAL LOW (ref 39.0–52.0)
Hemoglobin: 12.6 g/dL — ABNORMAL LOW (ref 13.0–17.0)
LYMPHS ABS: 2.1 10*3/uL (ref 0.7–4.0)
Lymphocytes Relative: 27.9 % (ref 12.0–46.0)
MCHC: 33.5 g/dL (ref 30.0–36.0)
MCV: 85.9 fl (ref 78.0–100.0)
MONO ABS: 0.6 10*3/uL (ref 0.1–1.0)
MONOS PCT: 8.6 % (ref 3.0–12.0)
NEUTROS ABS: 4.2 10*3/uL (ref 1.4–7.7)
NEUTROS PCT: 55.8 % (ref 43.0–77.0)
PLATELETS: 182 10*3/uL (ref 150.0–400.0)
RBC: 4.38 Mil/uL (ref 4.22–5.81)
RDW: 13.8 % (ref 11.5–15.5)
WBC: 7.6 10*3/uL (ref 4.0–10.5)

## 2016-02-23 LAB — COMPREHENSIVE METABOLIC PANEL
ALBUMIN: 4.1 g/dL (ref 3.5–5.2)
ALT: 22 U/L (ref 0–53)
AST: 18 U/L (ref 0–37)
Alkaline Phosphatase: 63 U/L (ref 39–117)
BUN: 26 mg/dL — ABNORMAL HIGH (ref 6–23)
CALCIUM: 9.6 mg/dL (ref 8.4–10.5)
CHLORIDE: 97 meq/L (ref 96–112)
CO2: 33 mEq/L — ABNORMAL HIGH (ref 19–32)
Creatinine, Ser: 1.33 mg/dL (ref 0.40–1.50)
GFR: 56.6 mL/min — AB (ref >60.00)
Glucose, Bld: 111 mg/dL — ABNORMAL HIGH (ref 70–99)
POTASSIUM: 3.7 meq/L (ref 3.5–5.1)
SODIUM: 138 meq/L (ref 135–145)
Total Bilirubin: 0.7 mg/dL (ref 0.2–1.2)
Total Protein: 7.5 g/dL (ref 6.0–8.3)

## 2016-02-23 LAB — LIPID PANEL
CHOLESTEROL: 160 mg/dL (ref 0–200)
HDL: 50.8 mg/dL (ref >39.00)
LDL Cholesterol: 78 mg/dL (ref 0–99)
NonHDL: 109.68
TRIGLYCERIDES: 157 mg/dL — AB (ref 0.0–149.0)
Total CHOL/HDL Ratio: 3
VLDL: 31.4 mg/dL (ref 0.0–40.0)

## 2016-02-23 LAB — HEMOGLOBIN A1C: HEMOGLOBIN A1C: 6.4 % (ref 4.6–6.5)

## 2016-02-23 LAB — HEPATITIS C ANTIBODY: HCV Ab: NEGATIVE

## 2016-02-23 LAB — VITAMIN B12: VITAMIN B 12: 385 pg/mL (ref 211–911)

## 2016-02-23 LAB — VITAMIN D 25 HYDROXY (VIT D DEFICIENCY, FRACTURES): VITD: 21.04 ng/mL — ABNORMAL LOW (ref 30.00–100.00)

## 2016-02-23 NOTE — Patient Instructions (Addendum)
  Victor Daniels , Thank you for taking time to come for your Medicare Wellness Visit. I appreciate your ongoing commitment to your health goals. Please review the following plan we discussed and let me know if I can assist you in the future.   Labs today  Return in 1 week for physical  Follow up with eye exam  Consider COLOGUARD option    This is a list of the screening recommended for you and due dates:  Health Maintenance  Topic Date Due  . Complete foot exam   10/16/1956  . Eye exam for diabetics  10/16/1956  . Colon Cancer Screening  10/16/1996  . Shingles Vaccine  10/16/2006  . Flu Shot  03/24/2016*  . Hemoglobin A1C  08/25/2016  . Pneumonia vaccines (2 of 2 - PPSV23) 02/22/2017  . Tetanus Vaccine  05/25/2025  .  Hepatitis C: One time screening is recommended by Center for Disease Control  (CDC) for  adults born from [birth year] through 1965.   Completed  *Topic was postponed. The date shown is not the original due date.
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
## 2016-02-24 NOTE — Progress Notes (Signed)
  I have reviewed the above information and agree with above.   Ari Bernabei, MD 

## 2016-02-28 ENCOUNTER — Encounter: Payer: Self-pay | Admitting: Internal Medicine

## 2016-02-29 ENCOUNTER — Encounter: Payer: Self-pay | Admitting: Internal Medicine

## 2016-02-29 ENCOUNTER — Ambulatory Visit (INDEPENDENT_AMBULATORY_CARE_PROVIDER_SITE_OTHER): Payer: Commercial Managed Care - HMO | Admitting: Internal Medicine

## 2016-02-29 VITALS — BP 112/64 | HR 72 | Temp 98.1°F | Resp 12 | Ht 72.0 in | Wt 298.8 lb

## 2016-02-29 DIAGNOSIS — E1169 Type 2 diabetes mellitus with other specified complication: Secondary | ICD-10-CM

## 2016-02-29 DIAGNOSIS — R0789 Other chest pain: Secondary | ICD-10-CM

## 2016-02-29 DIAGNOSIS — J441 Chronic obstructive pulmonary disease with (acute) exacerbation: Secondary | ICD-10-CM

## 2016-02-29 DIAGNOSIS — M542 Cervicalgia: Secondary | ICD-10-CM

## 2016-02-29 DIAGNOSIS — E119 Type 2 diabetes mellitus without complications: Secondary | ICD-10-CM

## 2016-02-29 DIAGNOSIS — Z0000 Encounter for general adult medical examination without abnormal findings: Secondary | ICD-10-CM | POA: Diagnosis not present

## 2016-02-29 DIAGNOSIS — E669 Obesity, unspecified: Secondary | ICD-10-CM

## 2016-02-29 DIAGNOSIS — I5030 Unspecified diastolic (congestive) heart failure: Secondary | ICD-10-CM | POA: Diagnosis not present

## 2016-02-29 DIAGNOSIS — R5383 Other fatigue: Secondary | ICD-10-CM | POA: Diagnosis not present

## 2016-02-29 DIAGNOSIS — D509 Iron deficiency anemia, unspecified: Secondary | ICD-10-CM

## 2016-02-29 MED ORDER — PREDNISONE 10 MG PO TABS: ORAL_TABLET | ORAL | Status: DC

## 2016-02-29 MED ORDER — TRAMADOL HCL 50 MG PO TABS: 100.0000 mg | ORAL_TABLET | Freq: Two times a day (BID) | ORAL | Status: DC

## 2016-02-29 MED ORDER — OXYCODONE HCL 15 MG PO TABS: 15.0000 mg | ORAL_TABLET | Freq: Three times a day (TID) | ORAL | Status: DC | PRN

## 2016-02-29 MED ORDER — LOSARTAN POTASSIUM 50 MG PO TABS: 50.0000 mg | ORAL_TABLET | Freq: Every day | ORAL | Status: DC

## 2016-02-29 NOTE — Progress Notes (Signed)
Patient ID: Victor Daniels, male    DOB: 1946/05/14  Age: 70 y.o. MRN: [id number]  The patient is here for annual  wellness examination but has not been seen since October and has several chronic issues that are not well managed.     Sees  Victor Daniels annually for management of BPH No previous flu vaccine this year  Last colonoscopy 1997 FOBT negative 2014   The risk factors are reflected in the social history.  The roster of all physicians providing medical care to patient - is listed in the Snapshot section of the chart.  Activities of daily living:  The patient is 100% independent in all ADLs: dressing, toileting, feeding as well as independent mobility  Home safety : The patient has smoke detectors in the home. They wear seatbelts.  There are no firearms at home. There is no violence in the home.   There is no risks for hepatitis, STDs or HIV. There is no   history of blood transfusion. They have no travel history to infectious disease endemic areas of the world.  The patient has seen their dentist in the last six month. They have seen their eye doctor in the last year. They admit to slight hearing difficulty with regard to whispered voices and some television programs.  They have deferred audiologic testing in the last year.  They do not  have excessive sun exposure. Discussed the need for sun protection: hats, long sleeves and use of sunscreen if there is significant sun exposure.   Diet: the importance of a healthy diet is discussed. They do have a healthy diet.  The benefits of regular aerobic exercise were discussed. She walks 4 times per week ,  20 minutes.   Depression screen: there are no signs or vegative symptoms of depression- irritability, change in appetite, anhedonia, sadness/tearfullness.  Cognitive assessment: the patient manages all their financial and personal affairs and is actively engaged. They could relate day,date,year and events; recalled 2/3 objects at 3  minutes; performed clock-face test normally.  The following portions of the patient's history were reviewed and updated as appropriate: allergies, current medications, past family history, past medical history,  past surgical history, past social history  and problem list.  Visual acuity was not assessed per patient preference since she has regular follow up with her ophthalmologist. Hearing and body mass index were assessed and reviewed.   During the course of the visit the patient was educated and counseled about appropriate screening and preventive services including : fall prevention , diabetes screening, nutrition counseling, colorectal cancer screening, and recommended immunizations.    CC: The primary encounter diagnosis was Other chest pain. Diagnoses of COPD exacerbation (HCC), CHF with left ventricular diastolic dysfunction, NYHA class 2 (HCC), Other fatigue, Iron deficiency anemia, Cervical spine pain, Visit for preventive health examination, and Diabetes mellitus type 2 in obese [facility]) were also pertinent to this visit. 1) he reports that he has Constant pain in his neck and lower  Back, which he is manageing with one oxycodone in the evening so he can sleep.  He has had prior attempts at treatment by a Pain Clinic but had no relief with multiple ESI injections and resented "being treated like I was a drug seeker/addict."   Not sleeping well due to pain   Discussed adding tramadol for daytime pain   2) he feels cold all the time  3) His breathing worse.  He has advanced COPD.  He cannot  afford the cost  of monthly Spiriva and Advair.   4)  He was seen recently by Dr Victor Daniels who referred him to Dr Victor Daniels for pulmonology and prescribed lasix and potassium supplement .  An ECHO was advised but patient refused it  .  Discussion today reveals that he did not understand what an  ECHO would be.   5)  He is having some word finding difficulty.  6) He feels drowsy all the time.  Thinks it  may be his medications.   has untreated  OSA>   7) Diabetes, new onset.  Discussed low glycemic index diet  History Victor Daniels has a past medical history of COPD (chronic obstructive pulmonary disease) (HCC); CHF (congestive heart failure) (HCC); Hyperlipidemia; Hypertension; Heart attack (HCC) (11/16/09); OSA (obstructive sleep apnea); DVT (deep venous thrombosis) (HCC); Alcoholic gastritis; CVA (cerebral infarction); CAD (coronary artery disease); Venous insufficiency of leg; and History of alcohol abuse (2005).   He has past surgical history that includes Joint replacement; Lung surgery (2007); Nephrectomy; Lobectomy (age 72); Nephrectomy (age 17); Spine surgery; and Total hip arthroplasty.   His family history includes Heart disease in his father and mother.He reports that he quit smoking about 6 years ago. His smoking use included Cigarettes. He has a 50 pack-year smoking history. He has never used smokeless tobacco. He reports that he drinks alcohol. He reports that he does not use illicit drugs.  Outpatient Prescriptions Prior to Visit  Medication Sig Dispense Refill  . albuterol (VENTOLIN HFA) 108 (90 BASE) MCG/ACT inhaler INHALE TWO PUFFS BY MOUTH EVERY 6 HOURS AS NEEDED FOR WHEEZING OR SHORTNESS OF BREATH 18 each 5  . amLODipine (NORVASC) 5 MG tablet Take 1 tablet (5 mg total) by mouth daily. 90 tablet 3  . aspirin 81 MG tablet Take 81 mg by mouth daily.    . carvedilol (COREG) 12.5 MG tablet Take 1 tablet (12.5 mg total) by mouth 2 (two) times daily with a meal. 180 tablet 3  . DEXTROMETHORPHAN HBR PO Take by mouth as needed.    . docusate sodium (COLACE) 100 MG capsule Take 100 mg by mouth daily.    . furosemide (LASIX) 20 MG tablet Take 1 tablet (20 mg total) by mouth daily as needed. 30 tablet 3  . ipratropium (ATROVENT) 0.03 % nasal spray Place 2 sprays into both nostrils every 12 (twelve) hours. 30 mL 12  . losartan-hydrochlorothiazide (HYZAAR) 100-25 MG per tablet Take 1 tablet by  mouth daily. 90 tablet 3  . Multiple Vitamin (MULTIVITAMIN) capsule Take 1 capsule by mouth daily.    . nitroGLYCERIN (NITROSTAT) 0.4 MG SL tablet Place 1 tablet (0.4 mg total) under the tongue every 5 (five) minutes as needed for chest pain. Maximum dose 3 tablets 50 tablet 3  . potassium chloride (K-DUR) 10 MEQ tablet Take 1 tablet (10 mEq total) by mouth daily as needed. 30 tablet 3  . simvastatin (ZOCOR) 20 MG tablet TAKE 1 TABLET AT BEDTIME 90 tablet 2  . SPIRIVA RESPIMAT 2.5 MCG/ACT AERS INHALE TWO SPRAY(S) BY MOUTH ONCE DAILY 4 g 3  . losartan (COZAAR) 50 MG tablet Take 50 mg by mouth daily.    Marland Kitchen oxyCODONE (ROXICODONE) 15 MG immediate release tablet Take 1 tablet (15 mg total) by mouth every 8 (eight) hours as needed for pain. 90 tablet 0   No facility-administered medications prior to visit.    Review of Systems   Patient denies headache, fevers, malaise, unintentional weight loss, skin rash, eye pain, sinus congestion and  sinus pain, sore throat, dysphagia,  hemoptysis , cough,  chest pain, palpitations, orthopnea,  abdominal pain, nausea, melena, diarrhea, constipation, flank pain, dysuria, hematuria, urinary  Frequency, nocturia, numbness, tingling, seizures,  Focal weakness, Loss of consciousness,  Tremor, insomnia, depression, anxiety, and suicidal ideation.      Objective:  BP 112/64 mmHg  Pulse 72  Temp(Src) 98.1 F (36.7 C) (Oral)  Resp 12  Ht 6' (1.829 m)  Wt 298 lb 12 oz (135.512 kg)  BMI 40.51 kg/m2  SpO2 95%  Physical Exam   General appearance: alert, cooperative and appears stated age Ears: normal TM's and external ear canals both ears Throat: lips, mucosa, and tongue normal; teeth and gums normal Neck: no adenopathy, no carotid bruit, supple, symmetrical, trachea midline and thyroid not enlarged, symmetric, no tenderness/mass/nodules Back: symmetric, no curvature. ROM normal. No CVA tenderness. Lungs: bilateral wheezing  Heart: regular rate and rhythm, S1,  S2 normal, no murmur, click, rub or gallop Abdomen: soft, non-tender; bowel sounds normal; no masses,  no organomegaly Pulses: 2+ and symmetric Skin: venous stasis changes ,  1+ pitting edema.  Lymph nodes: Cervical, supraclavicular, and axillary nodes normal.    Assessment & Plan:   Problem List Items Addressed This Visit    CHF with left ventricular diastolic dysfunction, NYHA class 2 (HCC)    I suspect he has decreased systolic functio and he  has agreed to having a repeat ECHO, which has been ordered today       Relevant Medications   losartan (COZAAR) 50 MG tablet   COPD exacerbation (HCC)    He is wheezing lightly today and reports shortness of breath.  He has been started on lasix by Dr Victor MillingGollan.  Adding prednisone taper .       Relevant Medications   predniSONE (DELTASONE) 10 MG tablet   Iron deficiency anemia    He is not a good candidate for colonoscopy given his COPD and untreated sleep apnea.  Cologuard ordered.       Fatigue    He continues to complain that none of his physicians have been able to cure his fatigue . i have reminded him that untreated sleep apnea ,  Sedentary lifestyle,  And COPD have all significant impact on his fatigue.       Cervical spine pain    He has not had cervical spine films since his fusion in 2006.  Adding tramadol for daytime pain.Marland Kitchen. Plain films ordered       Relevant Orders   DG Cervical Spine Complete   Visit for preventive health examination    Annual comprehensive preventive exam was done as well as an evaluation and management of acute and chronic conditions .  During the course of the visit the patient was educated and counseled about appropriate screening and preventive services including :  diabetes screening, lipid analysis with projected  10 year  risk for CAD , nutrition counseling, prostate and colorectal cancer screening, and recommended immunizations.  Printed recommendations for health maintenance screenings was given.        Diabetes mellitus type 2 in obese [facility](HCC)    Discussed his A1c which establishes him as a diabetic despite lacking a fasting glucose of 125. Low glycemic index diet discussed.       Relevant Medications   losartan (COZAAR) 50 MG tablet    Other Visit Diagnoses    Other chest pain    -  Primary    Relevant Orders    Echocardiogram  I am having Mr. Heckmann start on traMADol and predniSONE. I am also having him maintain his aspirin, albuterol, SPIRIVA RESPIMAT, nitroGLYCERIN, DEXTROMETHORPHAN HBR PO, ipratropium, losartan-hydrochlorothiazide, carvedilol, amLODipine, simvastatin, docusate sodium, multivitamin, furosemide, potassium chloride, losartan, and oxyCODONE.  Meds ordered this encounter  Medications  . traMADol (ULTRAM) 50 MG tablet    Sig: Take 2 tablets (100 mg total) by mouth 2 (two) times daily.    Dispense:  120 tablet    Refill:  1  . DISCONTD: losartan (COZAAR) 50 MG tablet    Sig: Take 1 tablet (50 mg total) by mouth daily.    Dispense:  30 tablet    Refill:  0  . losartan (COZAAR) 50 MG tablet    Sig: Take 1 tablet (50 mg total) by mouth daily.    Dispense:  90 tablet    Refill:  1  . predniSONE (DELTASONE) 10 MG tablet    Sig: 6 tablets on Day 1 , then reduce by 1 tablet daily until gone    Dispense:  21 tablet    Refill:  0  . oxyCODONE (ROXICODONE) 15 MG immediate release tablet    Sig: Take 1 tablet (15 mg total) by mouth every 8 (eight) hours as needed for pain.    Dispense:  90 tablet    Refill:  0    Medications Discontinued During This Encounter  Medication Reason  . losartan (COZAAR) 50 MG tablet Reorder  . losartan (COZAAR) 50 MG tablet Reorder  . oxyCODONE (ROXICODONE) 15 MG immediate release tablet Reorder    Follow-up: Return in about 4 weeks (around 03/28/2016) for follow up diabetes.   [name], MD

## 2016-02-29 NOTE — Patient Instructions (Addendum)
I am prescribing tramadol for daytime pain ,  And you can Continue using oxycodone in the evening  You can use the tramadol up to 100 mg every 12 hours maximum 4 tablets daily  You are tired all the time because you have untreated sleep apnea  And you are sedentary because of your COPD and  Heart condition   Prednisone taper today for your wheezing.   This is my  example of a  "Low GI" (glycemic index )  Diet:  It will allow you to lose 4 to 8  lbs  per month if you follow it carefully.  Your goal with exercise is a minimum of 30 minutes of aerobic exercise 5 days per week (Walking does not count once it becomes easy!)    All of the foods can be found at grocery stores and in bulk at [employer].  The Atkins protein bars and shakes are available in more varieties at Target, WalMart and [employer]'s [employer].     7 AM Breakfast:  Choose from the following:  Low carbohydrate Protein  Shakes (I recommend the  Premier Protein chocolate shake, s EAS AdvantEdge "Carb Control" shakes  Or the low carb shakes by Atkins.    2.5 carbs)   Arnold's "Sandwhich Thin"toasted  w/ peanut butter (no jelly: about 20 net carbs  "Bagel Thin" with cream cheese and salmon: about 20 carbs   a scrambled egg/bacon/cheese burrito made with Mission's "carb balance" whole wheat tortilla  (about 10 net carbs )  Medical laboratory scientific officerJimmy Deans sells microwaveable frittata (basically a quiche without the pastry crust) that is eaten cold and very convenient way to get your eggs  If you make your own shakes, avoid bananas and pineapple,  And use low carb greek yogurt or almond milk    Avoid cereal and bananas, oatmeal and cream of wheat and grits. They are loaded with carbohydrates!   10 AM: high protein snack:  Protein bar by Atkins (the snack size, under 200 cal, usually < 6 net carbs).    A stick of cheese:  Around 1 carb,  100 cal     Dannon Light n Fit [place]Greek Yogurt  (80 cal, 8 carbs)  Other so called "protein bars" and Greek yogurts tend to be  loaded with carbohydrates.  Remember, in food advertising, the word "energy" is synonymous for " carbohydrate."  Lunch:   A Sandwich using the bread choices listed, Can use any  Eggs,  lunchmeat, grilled meat or canned tuna), avocado, regular mayo/mustard  and cheese.  A Salad using blue cheese, ranch,  Goddess or vinagrette,  Avoid taco shells, croutons or "confetti" and no "candied nuts" but regular nuts OK.   No pretzels, nabs  or chips.  Pickles and miniature sweet peppers are a good low carb alternative that provide a "crunch"  The bread is the only source of carbohydrate in a sandwich and  can be decreased by trying some of these alternatives to traditional loaf bread  Joseph's makes a pita bread and a flat bread that are 50 cal and 4 net carbs available at BJs and WalMart.  This can be toasted to use with hummous as well  Toufayan makes a low carb flatbread that's 100 cal and 9 net carbs available at [employer] and [employer]  Mission makes 2 sizes of  Low carb whole wheat tortilla  (The large one is 210 cal and 6 net carbs)  Ezekiel bread is a loaf bread sold in  the frozen section of higher end grocery chains,  Very low carb  Avoid "Low fat dressings, as well as Reyne Dumas and [address] dressings They are loaded with sugar!   3 PM/ Mid day  Snack:  Consider  1 ounce of  almonds, walnuts, pistachios, pecans, peanuts,  Macadamia nuts or a nut medley.  Avoid "granola"; the dried cranberries and raisins are loaded with carbohydrates. Mixed nuts as long as there are no raisins,  cranberries or dried fruit.    Try the prosciutto/mozzarella cheese sticks by Fiorruci  In deli /backery section   High protein      6 PM  Dinner:     Meat/fowl/fish with a green salad, and either broccoli, cauliflower, green beans, spinach, brussel sprouts or  Lima beans. DO NOT BREAD THE PROTEIN!!      There is a low carb pasta by Dreamfield's that is acceptable and tastes great: only 5 digestible carbs/serving.( All  grocery stores but BJs carry it )  Try Kai Levins Angelo's chicken piccata or chicken or eggplant parm over low carb pasta.(Lowes and BJs)   Clifton Custard Sanchez's "Carnitas" (pulled pork, no sauce,  0 carbs) or his beef pot roast to make a dinner burrito (at BJ's)  Pesto over low carb pasta (bj's sells a good quality pesto in the center refrigerated section of the deli   Try satueeing  Roosvelt Harps with mushroooms  Whole wheat pasta is still full of digestible carbs and  Not as low in glycemic index as Dreamfield's.   Brown rice is still rice,  So skip the rice and noodles if you eat [place] or [place] (or at least limit to 1/2 cup)  9 PM snack :   Breyer's "low carb" fudgsicle or  ice cream bar (Carb Smart line), or  Weight Watcher's ice cream bar , or another "no sugar added" ice cream;  a serving of fresh berries/cherries with whipped cream   Cheese or DANNON'S LlGHT N FIT GREEK YOGURT  8 ounces of [place] unsweetened almond/cococunut milk    Treat yourself to a parfait made with whipped cream blueberiies, walnuts and vanilla greek yogurt  Avoid bananas, pineapple, grapes  and watermelon on a regular basis because they are high in sugar.  THINK OF THEM AS DESSERT  Remember that snack Substitutions should be less than 10 NET carbs per serving and meals < 20 carbs. Remember to subtract fiber grams to get the "net carbs."  .

## 2016-02-29 NOTE — Progress Notes (Signed)
Pre-visit discussion using our clinic review tool. No additional management support is needed unless otherwise documented below in the visit note.  

## 2016-03-01 ENCOUNTER — Encounter: Payer: Self-pay | Admitting: Pulmonary Disease

## 2016-03-01 DIAGNOSIS — Z0000 Encounter for general adult medical examination without abnormal findings: Secondary | ICD-10-CM | POA: Insufficient documentation

## 2016-03-01 DIAGNOSIS — E1159 Type 2 diabetes mellitus with other circulatory complications: Secondary | ICD-10-CM | POA: Insufficient documentation

## 2016-03-01 NOTE — Assessment & Plan Note (Signed)
He continues to complain that none of his physicians have been able to cure his fatigue . i have reminded him that untreated sleep apnea ,  Sedentary lifestyle,  And COPD have all significant impact on his fatigue.

## 2016-03-01 NOTE — Assessment & Plan Note (Signed)
He is not a good candidate for colonoscopy given his COPD and untreated sleep apnea.  Cologuard ordered.

## 2016-03-01 NOTE — Assessment & Plan Note (Signed)
I suspect he has decreased systolic functio and he  has agreed to having a repeat ECHO, which has been ordered today

## 2016-03-01 NOTE — Assessment & Plan Note (Signed)
Annual comprehensive preventive exam was done as well as an evaluation and management of acute and chronic conditions .  During the course of the visit the patient was educated and counseled about appropriate screening and preventive services including :  diabetes screening, lipid analysis with projected  10 year  risk for CAD , nutrition counseling, prostate and colorectal cancer screening, and recommended immunizations.  Printed recommendations for health maintenance screenings was given.  

## 2016-03-01 NOTE — Assessment & Plan Note (Signed)
Discussed his A1c which establishes him as a diabetic despite lacking a fasting glucose of 125. Low glycemic index diet discussed.

## 2016-03-01 NOTE — Assessment & Plan Note (Addendum)
He has not had cervical spine films since his fusion in 2006.  Adding tramadol for daytime pain.Marland Kitchen. Plain films ordered

## 2016-03-01 NOTE — Assessment & Plan Note (Signed)
He is wheezing lightly today and reports shortness of breath.  He has been started on lasix by Dr Mariah MillingGollan.  Adding prednisone taper .

## 2016-03-02 ENCOUNTER — Other Ambulatory Visit: Payer: Self-pay

## 2016-03-02 ENCOUNTER — Ambulatory Visit (INDEPENDENT_AMBULATORY_CARE_PROVIDER_SITE_OTHER): Payer: Commercial Managed Care - HMO

## 2016-03-02 DIAGNOSIS — R0789 Other chest pain: Secondary | ICD-10-CM | POA: Diagnosis not present

## 2016-03-02 LAB — ECHOCARDIOGRAM COMPLETE
CHL CUP STROKE VOLUME: 104 mL
E decel time: 225 msec
FS: 22 % — AB (ref 28–44)
IV/PV OW: 0.91
LA diam end sys: 31 cm
LV PW d: 12.3 mm — AB (ref 0.6–1.1)
LVOT area: 5.31 cm2
LVOT peak vel: 95.7 m/s
MV pk E vel: 68.4 m/s
MVPKAVEL: 62.6 m/s
VTI: 19.6 cm

## 2016-03-02 MED ORDER — POTASSIUM CHLORIDE ER 10 MEQ PO TBCR: 10.0000 meq | EXTENDED_RELEASE_TABLET | Freq: Every day | ORAL | Status: DC | PRN

## 2016-03-02 MED ORDER — FUROSEMIDE 20 MG PO TABS: 20.0000 mg | ORAL_TABLET | Freq: Every day | ORAL | Status: DC | PRN

## 2016-03-02 NOTE — Telephone Encounter (Signed)
Refill sent for Furosemide and Klor Con

## 2016-03-06 ENCOUNTER — Other Ambulatory Visit: Payer: Self-pay | Admitting: *Deleted

## 2016-03-06 MED ORDER — POTASSIUM CHLORIDE ER 10 MEQ PO TBCR: 10.0000 meq | EXTENDED_RELEASE_TABLET | Freq: Every day | ORAL | Status: DC | PRN

## 2016-03-06 MED ORDER — FUROSEMIDE 20 MG PO TABS: 20.0000 mg | ORAL_TABLET | Freq: Every day | ORAL | Status: DC | PRN

## 2016-03-06 NOTE — Telephone Encounter (Signed)
Requested Prescriptions   Signed Prescriptions Disp Refills  . furosemide (LASIX) 20 MG tablet 90 tablet 3    Sig: Take 1 tablet (20 mg total) by mouth daily as needed.    Authorizing Provider: Antonieta IbaGOLLAN, TIMOTHY J    Ordering User: Shawnie DapperLOPEZ, MARINA C  . potassium chloride (K-DUR) 10 MEQ tablet 90 tablet 3    Sig: Take 1 tablet (10 mEq total) by mouth daily as needed.    Authorizing Provider: Antonieta IbaGOLLAN, TIMOTHY J    Ordering User: Kendrick FriesLOPEZ, MARINA C

## 2016-03-10 ENCOUNTER — Telehealth: Payer: Self-pay | Admitting: Internal Medicine

## 2016-03-14 NOTE — Addendum Note (Signed)
Addended by: Henrene PastorAVIS, KATHY R on: 03/14/2016 11:05 AM   Modules accepted: Kipp BroodSmartSet

## 2016-03-14 NOTE — Progress Notes (Signed)
Cologuard ordered

## 2016-03-20 ENCOUNTER — Encounter: Payer: Self-pay | Admitting: Pulmonary Disease

## 2016-03-20 ENCOUNTER — Ambulatory Visit (INDEPENDENT_AMBULATORY_CARE_PROVIDER_SITE_OTHER): Payer: Commercial Managed Care - HMO | Admitting: Pulmonary Disease

## 2016-03-20 VITALS — BP 118/70 | HR 81 | Ht 72.0 in | Wt 299.1 lb

## 2016-03-20 DIAGNOSIS — R0902 Hypoxemia: Secondary | ICD-10-CM

## 2016-03-20 DIAGNOSIS — Z1212 Encounter for screening for malignant neoplasm of rectum: Secondary | ICD-10-CM | POA: Diagnosis not present

## 2016-03-20 DIAGNOSIS — E669 Obesity, unspecified: Secondary | ICD-10-CM | POA: Diagnosis not present

## 2016-03-20 DIAGNOSIS — J449 Chronic obstructive pulmonary disease, unspecified: Secondary | ICD-10-CM

## 2016-03-20 DIAGNOSIS — R0600 Dyspnea, unspecified: Secondary | ICD-10-CM | POA: Diagnosis not present

## 2016-03-20 DIAGNOSIS — Z1211 Encounter for screening for malignant neoplasm of colon: Secondary | ICD-10-CM | POA: Diagnosis not present

## 2016-03-20 DIAGNOSIS — G4733 Obstructive sleep apnea (adult) (pediatric): Secondary | ICD-10-CM

## 2016-03-20 MED ORDER — GLYCOPYRROLATE-FORMOTEROL 9-4.8 MCG/ACT IN AERO: 2.0000 | INHALATION_SPRAY | Freq: Two times a day (BID) | RESPIRATORY_TRACT | Status: DC

## 2016-03-23 ENCOUNTER — Telehealth: Payer: Self-pay | Admitting: *Deleted
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
## 2016-03-23 NOTE — Telephone Encounter (Signed)
Submitted PA for Bevespi thru [facility]. Key: [id number] Submitted for review. Will await response.

## 2016-03-24 MED ORDER — TIOTROPIUM BROMIDE-OLODATEROL 2.5-2.5 MCG/ACT IN AERS: 2.0000 | INHALATION_SPRAY | Freq: Every day | RESPIRATORY_TRACT | Status: DC

## 2016-03-24 NOTE — Telephone Encounter (Signed)
Victor Daniels has been denied by insurance. States must try Stiolto, Anoro, Serevent, and Striverdi. Please advise.

## 2016-03-24 NOTE — Telephone Encounter (Signed)
Pt informed of medication change. Nothing further needed. 

## 2016-03-24 NOTE — Telephone Encounter (Signed)
LMOM for pt to return call. 

## 2016-03-24 NOTE — Telephone Encounter (Signed)
I placed order for Stiolto - 2 actuations daily  Billy Fischeravid Cali Cuartas, MD PCCM service Mobile [phone] Pager [phone] 03/24/2016

## 2016-03-29 ENCOUNTER — Ambulatory Visit (INDEPENDENT_AMBULATORY_CARE_PROVIDER_SITE_OTHER): Payer: Commercial Managed Care - HMO | Admitting: Internal Medicine

## 2016-03-29 ENCOUNTER — Encounter: Payer: Self-pay | Admitting: Internal Medicine

## 2016-03-29 VITALS — BP 126/68 | HR 74 | Temp 97.7°F | Resp 12 | Ht 72.0 in | Wt 303.0 lb

## 2016-03-29 DIAGNOSIS — R079 Chest pain, unspecified: Secondary | ICD-10-CM

## 2016-03-29 DIAGNOSIS — R7303 Prediabetes: Secondary | ICD-10-CM | POA: Diagnosis not present

## 2016-03-29 DIAGNOSIS — K209 Esophagitis, unspecified without bleeding: Secondary | ICD-10-CM

## 2016-03-29 DIAGNOSIS — I5030 Unspecified diastolic (congestive) heart failure: Secondary | ICD-10-CM

## 2016-03-29 LAB — COLOGUARD

## 2016-03-29 MED ORDER — OMEPRAZOLE 40 MG PO CPDR: 40.0000 mg | DELAYED_RELEASE_CAPSULE | Freq: Every day | ORAL | Status: DC

## 2016-03-29 NOTE — Progress Notes (Signed)
Subjective:  Patient ID: Victor KempEdward R Deadwyler, male    DOB: 02-Dec-1946  Age: 70 y.o. MRN: [id number]  CC: The primary encounter diagnosis was Esophagitis. Diagnoses of Prediabetes, Chest pain with high risk for cardiac etiology, and CHF with left ventricular diastolic dysfunction, NYHA class 2 (HCC) were also pertinent to this visit.  HPI Victor Daniels presents for 1 month follow up on diabetes.  Patient has been diagnosed as pre diabetic due to a1c of 6.4 .His fasting sugars have been nondiagnostic.   Has stopped drinking Diet coke,  And is following a low GI diet  left hand going numb a lot.  .   BP improved.   Stopped tramadol due to constipation   Has PFTS  Ordered for May 24th ,  still undecided about repeating the sleep study   Still having chest pain that feels like indigestion in the substernal area both at rest and with exertion . Resolves spontaneously after 5 minutes, occasionally acc'd by a hot flash ,occurs about once a week not daily , has not tried  NTG.   He has gained 4 lbs since last visit. Not using the furosemde.   Had gastritis and black stools after starting furosemide and potassium  Prescribed by Dr Mariah MillingGollan . So he stopped them after 2 days  Pain in right  hip and pain in neck and shoulder keeping him up at night,  it up .  Had previous cervical spine surgery  with fusion by Cordie GriceGreg Poole.  10 years ago  takign oxycodone prn ,   Feet are numb.      Lab Results  Component Value Date   HGBA1C 6.4 02/23/2016     Outpatient Prescriptions Prior to Visit  Medication Sig Dispense Refill  . albuterol (VENTOLIN HFA) 108 (90 BASE) MCG/ACT inhaler INHALE TWO PUFFS BY MOUTH EVERY 6 HOURS AS NEEDED FOR WHEEZING OR SHORTNESS OF BREATH 18 each 5  . amLODipine (NORVASC) 5 MG tablet Take 1 tablet (5 mg total) by mouth daily. 90 tablet 3  . aspirin 81 MG tablet Take 81 mg by mouth daily.    . carvedilol (COREG) 12.5 MG tablet Take 1 tablet (12.5 mg total) by mouth 2  (two) times daily with a meal. 180 tablet 3  . DEXTROMETHORPHAN HBR PO Take by mouth as needed.    . docusate sodium (COLACE) 100 MG capsule Take 100 mg by mouth daily.    . Glycopyrrolate-Formoterol (BEVESPI AEROSPHERE) 9-4.8 MCG/ACT AERO Inhale 2 puffs into the lungs 2 (two) times daily. 10.7 g 11  . ipratropium (ATROVENT) 0.03 % nasal spray Place 2 sprays into both nostrils every 12 (twelve) hours. 30 mL 12  . losartan-hydrochlorothiazide (HYZAAR) 100-25 MG per tablet Take 1 tablet by mouth daily. 90 tablet 3  . Multiple Vitamin (MULTIVITAMIN) capsule Take 1 capsule by mouth daily.    . nitroGLYCERIN (NITROSTAT) 0.4 MG SL tablet Place 1 tablet (0.4 mg total) under the tongue every 5 (five) minutes as needed for chest pain. Maximum dose 3 tablets 50 tablet 3  . oxyCODONE (ROXICODONE) 15 MG immediate release tablet Take 1 tablet (15 mg total) by mouth every 8 (eight) hours as needed for pain. 90 tablet 0  . simvastatin (ZOCOR) 20 MG tablet TAKE 1 TABLET AT BEDTIME 90 tablet 2  . SPIRIVA RESPIMAT 2.5 MCG/ACT AERS INHALE TWO SPRAY(S) BY MOUTH ONCE DAILY 4 g 3  . Tiotropium Bromide-Olodaterol (STIOLTO RESPIMAT) 2.5-2.5 MCG/ACT AERS Inhale 2 puffs into the lungs daily.  1 Inhaler 11  . losartan (COZAAR) 50 MG tablet Take 1 tablet (50 mg total) by mouth daily. 90 tablet 1  . Glycopyrrolate-Formoterol (BEVESPI AEROSPHERE) 9-4.8 MCG/ACT AERO Inhale 2 puffs into the lungs 2 (two) times daily. 1 Inhaler 0  . traMADol (ULTRAM) 50 MG tablet Take 2 tablets (100 mg total) by mouth 2 (two) times daily. (Patient not taking: Reported on 03/29/2016) 120 tablet 1   No facility-administered medications prior to visit.    Review of Systems;  Patient denies headache, fevers, malaise, unintentional weight loss, skin rash, eye pain, sinus congestion and sinus pain, sore throat, dysphagia,  hemoptysis , cough, dyspnea, wheezing, chest pain, palpitations, orthopnea, edema, abdominal pain, nausea, melena, diarrhea,  constipation, flank pain, dysuria, hematuria, urinary  Frequency, nocturia, numbness, tingling, seizures,  Focal weakness, Loss of consciousness,  Tremor, insomnia, depression, anxiety, and suicidal ideation.      Objective:  BP 126/68 mmHg  Pulse 74  Temp(Src) 97.7 F (36.5 C) (Oral)  Resp 12  Ht 6' (1.829 m)  Wt 303 lb (137.44 kg)  BMI 41.09 kg/m2  SpO2 95%  BP Readings from Last 3 Encounters:  03/29/16 126/68  03/20/16 118/70  02/29/16 112/64    Wt Readings from Last 3 Encounters:  03/29/16 303 lb (137.44 kg)  03/20/16 299 lb 1.6 oz (135.671 kg)  02/29/16 298 lb 12 oz (135.512 kg)    General appearance: alert, cooperative and appears stated age Ears: normal TM's and external ear canals both ears Throat: lips, mucosa, and tongue normal; teeth and gums normal Neck: no adenopathy, no carotid bruit, supple, symmetrical, trachea midline and thyroid not enlarged, symmetric, no tenderness/mass/nodules Back: symmetric, no curvature. ROM normal. No CVA tenderness. Lungs: clear to auscultation bilaterally Heart: regular rate and rhythm, S1, S2 normal, no murmur, click, rub or gallop Abdomen: soft, non-tender; bowel sounds normal; no masses,  no organomegaly Pulses: 2+ and symmetric Skin: Skin color, texture, turgor normal. No rashes or lesions Lymph nodes: Cervical, supraclavicular, and axillary nodes normal.  Lab Results  Component Value Date   HGBA1C 6.4 02/23/2016   HGBA1C 6.3 08/20/2014   HGBA1C 6.1 03/24/2014    Lab Results  Component Value Date   CREATININE 1.33 02/23/2016   CREATININE 1.17 06/25/2015   CREATININE 1.00 04/03/2015    Lab Results  Component Value Date   WBC 7.6 02/23/2016   HGB 12.6* 02/23/2016   HCT 37.6* 02/23/2016   PLT 182.0 02/23/2016   GLUCOSE 111* 02/23/2016   CHOL 160 02/23/2016   TRIG 157.0* 02/23/2016   HDL 50.80 02/23/2016   LDLCALC 78 02/23/2016   ALT 22 02/23/2016   AST 18 02/23/2016   NA 138 02/23/2016   K 3.7 02/23/2016     CL 97 02/23/2016   CREATININE 1.33 02/23/2016   BUN 26* 02/23/2016   CO2 33* 02/23/2016   TSH 1.67 12/24/2014   INR 0.9 04/02/2015   HGBA1C 6.4 02/23/2016    Assessment & Plan:   Problem List Items Addressed This Visit    CHF with left ventricular diastolic dysfunction, NYHA class 2 (HCC)    His weight gain of 4 lbs suggests fluid retention .Marland Kitchen Advised to resume furosemide        Chest pain with high risk for cardiac etiology    He continues to have pain both at rest and with exertion,  Workup thus far negative.  Trial of PPI for esophagitis      Prediabetes    Discussed his A1c which establishes  him as a pre diabetic despite lacking a fasting glucose of 125. Low glycemic index diet discussed.         Relevant Orders   Hemoglobin A1c   Lipid panel   Microalbumin / creatinine urine ratio   Comprehensive metabolic panel    Other Visit Diagnoses    Esophagitis    -  Primary      A total of 40 minutes of face to face time was spent with patient more than half of which was spent in counselling and coordination of care   I have discontinued Mr. Macrae traMADol and losartan. I am also having him start on omeprazole. Additionally, I am having him maintain his aspirin, albuterol, SPIRIVA RESPIMAT, nitroGLYCERIN, DEXTROMETHORPHAN HBR PO, ipratropium, losartan-hydrochlorothiazide, carvedilol, amLODipine, simvastatin, docusate sodium, multivitamin, oxyCODONE, Glycopyrrolate-Formoterol, and Tiotropium Bromide-Olodaterol.  Meds ordered this encounter  Medications  . omeprazole (PRILOSEC) 40 MG capsule    Sig: Take 1 capsule (40 mg total) by mouth daily.    Dispense:  30 capsule    Refill:  5    Medications Discontinued During This Encounter  Medication Reason  . Glycopyrrolate-Formoterol (BEVESPI AEROSPHERE) 9-4.8 MCG/ACT AERO Duplicate  . traMADol (ULTRAM) 50 MG tablet   . losartan (COZAAR) 50 MG tablet     Follow-up: Return in about 2 months (around  05/29/2016).   [name], MD

## 2016-03-29 NOTE — Progress Notes (Signed)
Pre-visit discussion using our clinic review tool. No additional management support is needed unless otherwise documented below in the visit note.  

## 2016-03-29 NOTE — Patient Instructions (Addendum)
  You might want to try a premixed protein drink called Premier Protein shake in the morning.  It is less $$$ and very low sugar.    160 cal  30 g protein  1 g sugar 50% calcium daily requirement  KIND bars look for the 5 g sugar variety     I am going to treat you for esophagitis ,  Which may be causing your episodes of chest pain .  I am starting you on omeprazole 40  mg daily to take in the am 15 minutes or more before eating   Try resuming 20 mg of furosemide every other day in the morning with omeprazole, for fluid  Retention,   if  The stomach pain returns STOP and let me know

## 2016-03-31 ENCOUNTER — Telehealth: Payer: Self-pay | Admitting: Internal Medicine

## 2016-03-31 NOTE — Telephone Encounter (Signed)
MyChart message sent  Re cologuard results

## 2016-04-01 NOTE — Assessment & Plan Note (Signed)
His weight gain of 4 lbs suggests fluid retention .Marland Kitchen. Advised to resume furosemide

## 2016-04-01 NOTE — Assessment & Plan Note (Signed)
He continues to have pain both at rest and with exertion,  Workup thus far negative.  Trial of PPI for esophagitis

## 2016-04-01 NOTE — Assessment & Plan Note (Signed)
Discussed his A1c which establishes him as a pre diabetic despite lacking a fasting glucose of 125. Low glycemic index diet discussed.

## 2016-04-11 ENCOUNTER — Encounter: Payer: Self-pay | Admitting: Internal Medicine

## 2016-04-19 ENCOUNTER — Other Ambulatory Visit: Payer: Self-pay | Admitting: Internal Medicine

## 2016-04-20 ENCOUNTER — Telehealth: Payer: Self-pay | Admitting: Internal Medicine

## 2016-04-20 MED ORDER — ALBUTEROL SULFATE HFA 108 (90 BASE) MCG/ACT IN AERS: INHALATION_SPRAY | RESPIRATORY_TRACT | Status: DC

## 2016-04-20 NOTE — Telephone Encounter (Signed)
Refilled. thanks

## 2016-04-20 NOTE — Telephone Encounter (Signed)
Pt called about a refill on getting a refill on his albuterol (VENTOLIN HFA) 108 (90 BASE) MCG/ACT inhaler. Pharmacy is [facility] on [address]

## 2016-04-25 ENCOUNTER — Ambulatory Visit: Payer: Commercial Managed Care - HMO

## 2016-04-25 ENCOUNTER — Other Ambulatory Visit: Payer: Self-pay | Admitting: Internal Medicine

## 2016-04-26 ENCOUNTER — Other Ambulatory Visit: Payer: Self-pay | Admitting: Internal Medicine

## 2016-04-26 MED ORDER — OXYCODONE HCL 15 MG PO TABS: 15.0000 mg | ORAL_TABLET | Freq: Three times a day (TID) | ORAL | Status: DC | PRN

## 2016-04-26 NOTE — Telephone Encounter (Signed)
Refilled 02/29/2016. Patient last seen 03/29/16 and labs done same time. Please advise?

## 2016-04-26 NOTE — Telephone Encounter (Signed)
RefillED 

## 2016-04-26 NOTE — Telephone Encounter (Signed)
Notified patient refill ready for pick up and placed script at front desk.

## 2016-05-11 DIAGNOSIS — R6882 Decreased libido: Secondary | ICD-10-CM | POA: Diagnosis not present

## 2016-05-11 DIAGNOSIS — N411 Chronic prostatitis: Secondary | ICD-10-CM | POA: Diagnosis not present

## 2016-05-11 DIAGNOSIS — R339 Retention of urine, unspecified: Secondary | ICD-10-CM | POA: Diagnosis not present

## 2016-05-11 DIAGNOSIS — R351 Nocturia: Secondary | ICD-10-CM | POA: Diagnosis not present

## 2016-05-11 DIAGNOSIS — N401 Enlarged prostate with lower urinary tract symptoms: Secondary | ICD-10-CM | POA: Diagnosis not present

## 2016-05-15 ENCOUNTER — Other Ambulatory Visit: Payer: Self-pay

## 2016-05-15 MED ORDER — LOSARTAN POTASSIUM-HCTZ 100-25 MG PO TABS: 1.0000 | ORAL_TABLET | Freq: Every day | ORAL | Status: DC

## 2016-05-17 ENCOUNTER — Ambulatory Visit (INDEPENDENT_AMBULATORY_CARE_PROVIDER_SITE_OTHER): Payer: Commercial Managed Care - HMO | Admitting: *Deleted

## 2016-05-17 DIAGNOSIS — R0600 Dyspnea, unspecified: Secondary | ICD-10-CM

## 2016-05-17 LAB — PULMONARY FUNCTION TEST
DL/VA % pred: 88 %
DL/VA: 4.16 ml/min/mmHg/L
DLCO UNC % PRED: 51 %
DLCO unc: 17.99 ml/min/mmHg
FEF 25-75 POST: 0.65 L/s
FEF 25-75 PRE: 0.53 L/s
FEF2575-%CHANGE-POST: 21 %
FEF2575-%PRED-POST: 24 %
FEF2575-%PRED-PRE: 19 %
FEV1-%Change-Post: 16 %
FEV1-%PRED-POST: 35 %
FEV1-%PRED-PRE: 30 %
FEV1-POST: 1.25 L
FEV1-PRE: 1.07 L
FEV1FVC-%CHANGE-POST: 9 %
FEV1FVC-%PRED-PRE: 67 %
FEV6-%CHANGE-POST: 5 %
FEV6-%PRED-POST: 50 %
FEV6-%Pred-Pre: 47 %
FEV6-PRE: 2.15 L
FEV6-Post: 2.27 L
FEV6FVC-%CHANGE-POST: 0 %
FEV6FVC-%Pred-Post: 103 %
FEV6FVC-%Pred-Pre: 104 %
FVC-%CHANGE-POST: 6 %
FVC-%PRED-POST: 48 %
FVC-%Pred-Pre: 45 %
FVC-Post: 2.3 L
FVC-Pre: 2.16 L
POST FEV1/FVC RATIO: 54 %
PRE FEV1/FVC RATIO: 50 %
Post FEV6/FVC ratio: 99 %
Pre FEV6/FVC Ratio: 99 %

## 2016-05-17 NOTE — Progress Notes (Signed)
PFT with Nitrogen Washout.

## 2016-05-19 ENCOUNTER — Encounter: Payer: Self-pay | Admitting: Pulmonary Disease

## 2016-05-19 ENCOUNTER — Ambulatory Visit
Admission: RE | Admit: 2016-05-19 | Discharge: 2016-05-19 | Disposition: A | Discharge status: Home / Self Care | Payer: Commercial Managed Care - HMO | Source: Ambulatory Visit | Attending: Pulmonary Disease | Admitting: Pulmonary Disease

## 2016-05-19 ENCOUNTER — Ambulatory Visit (INDEPENDENT_AMBULATORY_CARE_PROVIDER_SITE_OTHER): Payer: Commercial Managed Care - HMO | Admitting: Pulmonary Disease

## 2016-05-19 VITALS — BP 142/90 | HR 74 | Ht 72.0 in | Wt 301.0 lb

## 2016-05-19 DIAGNOSIS — I429 Cardiomyopathy, unspecified: Secondary | ICD-10-CM

## 2016-05-19 DIAGNOSIS — R0600 Dyspnea, unspecified: Secondary | ICD-10-CM | POA: Diagnosis not present

## 2016-05-19 DIAGNOSIS — E669 Obesity, unspecified: Secondary | ICD-10-CM

## 2016-05-19 DIAGNOSIS — R0602 Shortness of breath: Secondary | ICD-10-CM | POA: Diagnosis not present

## 2016-05-19 DIAGNOSIS — J449 Chronic obstructive pulmonary disease, unspecified: Secondary | ICD-10-CM

## 2016-05-19 MED ORDER — ARFORMOTEROL TARTRATE 15 MCG/2ML IN NEBU: 15.0000 ug | INHALATION_SOLUTION | Freq: Two times a day (BID) | RESPIRATORY_TRACT | Status: DC

## 2016-05-19 MED ORDER — BUDESONIDE 0.25 MG/2ML IN SUSP: 0.2500 mg | Freq: Two times a day (BID) | RESPIRATORY_TRACT | Status: DC

## 2016-05-22 NOTE — Progress Notes (Signed)
PULMONARY CONSULT NOTE  Requesting MD/Service: [name] Date of initial consultation: 03/20/16 Reason for consultation: COPD, Dyspnea  PT PROFILE: 70 y.o. M former smoker, previously followed by [name] (last seen in 2014) with obesity, COPD (FEV1 40% predicted in 2011) and probable OSA re-evaluated 03/20/16 for progressive DOE. Also with prior hx of cardiomyopathy (LVEF 26% in 2010 but normal 03/02/16)  DATA: 05/17/16 PFTs: severe airflow limitation due to mixed obstruction and restriction. Lung volumes moderately reduced 05/19/16 CXR: Chronic changes in R base  SUBJ: This is a routine office follow up. He continues to have class IV dyspnea. He is not using Spiriva due to high copayment. It was only modestly beneficial. He uses albuterol MDI 3-4 X per day with some noted benefit. Denies CP, fever, purulent sputum, hemoptysis, LE edema and calf tenderness  OBJ:  Filed Vitals:   05/19/16 1156  BP: 142/90  Pulse: 74  Height: 6' (1.829 m)  Weight: 301 lb (136.533 kg)  SpO2: 93%     EXAM:  Gen: Obese, NAD HEENT: NCAT Neck: No JVD Lungs: breath sounds moderately diminished, No wheezes, R basilar crackles Cardiovascular: Normal rate, reg rhythm, no murmurs noted Abdomen: Soft, nontender, normal BS Ext: Chronic stasis changes, no clubbing or cyanosis, 1-2 + symmetric edema Neuro: grossly intact  DATA:   BMP Latest Ref Rng 02/23/2016 06/25/2015 04/04/2015  Glucose 70 - 99 mg/dL 604(V111(H) 409(W105(H) -  BUN 6 - 23 mg/dL 11(B26(H) 20 -  Creatinine 0.40 - 1.50 mg/dL 1.471.33 8.291.17 -  Sodium 562135 - 145 mEq/L 138 140 -  Potassium 3.5 - 5.1 mEq/L 3.7 3.8 3.6  Chloride 96 - 112 mEq/L 97 99 -  CO2 19 - 32 mEq/L 33(H) 33(H) -  Calcium 8.4 - 10.5 mg/dL 9.6 9.2 -    CBC Latest Ref Rng 02/23/2016 06/25/2015 04/04/2015  WBC 4.0 - 10.5 K/uL 7.6 7.0 11.0(H)  Hemoglobin 13.0 - 17.0 g/dL 12.6(L) 13.6 11.0(L)  Hematocrit 39.0 - 52.0 % 37.6(L) 41.2 35.1(L)  Platelets 150.0 - 400.0 K/uL 182.0 181.0 150    CXR:  As  above  IMPRESSION:   1) Class 3/4 dyspnea due to COPD/restrictive physiology. The restriction is due to obesity and prior R lung surgery 2) Borderline hypoxemia - he has nocturnal O2 but uses minimally during day 3) Suspected OSA 4) Difficulty affording co-payments for medication  PLAN:  1) Again discussed weight loss strategies 2) Begin nebulized budesonide and arformoterol in hopes this will be covered by Medicare 3) Sleep study ordered but not yet performed 4) ROV in 6-8 weeks   Victor Fischeravid Cloys [name], MD PCCM service Mobile [phone] Pager [phone] 05/23/2016

## 2016-05-24 DIAGNOSIS — R6882 Decreased libido: Secondary | ICD-10-CM | POA: Diagnosis not present

## 2016-05-29 ENCOUNTER — Telehealth: Payer: Self-pay | Admitting: Pulmonary Disease

## 2016-05-29 ENCOUNTER — Other Ambulatory Visit (INDEPENDENT_AMBULATORY_CARE_PROVIDER_SITE_OTHER): Payer: Commercial Managed Care - HMO

## 2016-05-29 DIAGNOSIS — R7303 Prediabetes: Secondary | ICD-10-CM | POA: Diagnosis not present

## 2016-05-29 DIAGNOSIS — J449 Chronic obstructive pulmonary disease, unspecified: Secondary | ICD-10-CM

## 2016-05-29 LAB — LIPID PANEL
CHOLESTEROL: 139 mg/dL (ref 0–200)
HDL: 42.8 mg/dL (ref >39.00)
LDL CALC: 74 mg/dL (ref 0–99)
NONHDL: 95.85
TRIGLYCERIDES: 107 mg/dL (ref 0.0–149.0)
Total CHOL/HDL Ratio: 3
VLDL: 21.4 mg/dL (ref 0.0–40.0)

## 2016-05-29 LAB — MICROALBUMIN / CREATININE URINE RATIO
CREATININE, U: 104.3 mg/dL
MICROALB UR: 2.3 mg/dL — AB (ref 0.0–1.9)
Microalb Creat Ratio: 2.2 mg/g (ref 0.0–30.0)

## 2016-05-29 LAB — COMPREHENSIVE METABOLIC PANEL
ALBUMIN: 3.9 g/dL (ref 3.5–5.2)
ALT: 14 U/L (ref 0–53)
AST: 14 U/L (ref 0–37)
Alkaline Phosphatase: 69 U/L (ref 39–117)
BUN: 30 mg/dL — ABNORMAL HIGH (ref 6–23)
CALCIUM: 9.6 mg/dL (ref 8.4–10.5)
CHLORIDE: 100 meq/L (ref 96–112)
CO2: 34 meq/L — AB (ref 19–32)
Creatinine, Ser: 1.35 mg/dL (ref 0.40–1.50)
GFR: 55.6 mL/min — ABNORMAL LOW (ref >60.00)
Glucose, Bld: 120 mg/dL — ABNORMAL HIGH (ref 70–99)
POTASSIUM: 3.6 meq/L (ref 3.5–5.1)
Sodium: 141 mEq/L (ref 135–145)
Total Bilirubin: 0.8 mg/dL (ref 0.2–1.2)
Total Protein: 6.6 g/dL (ref 6.0–8.3)

## 2016-05-29 LAB — HEMOGLOBIN A1C: HEMOGLOBIN A1C: 6.2 % (ref 4.6–6.5)

## 2016-05-29 NOTE — Telephone Encounter (Signed)
Spoke with pt who states Rosalyn GessBrovana is $3000. [facility] [employer] [facility] state brovana needs a PA. Initiated PA over the phone as expedited. Informed pt [employer] states we should receive fax in 24hrs. Will call pt once received.

## 2016-05-29 NOTE — Telephone Encounter (Signed)
Patient said he left msg for misty x 2 about medication denial .  Brovana 15 mcg/ 2 ml x 2 neb was denied for coverage.  Please call .     Misty spoke with patient in office.

## 2016-05-29 NOTE — Telephone Encounter (Signed)
[employer] Ref # [id number]

## 2016-05-29 NOTE — Telephone Encounter (Signed)
err

## 2016-05-31 ENCOUNTER — Encounter: Payer: Self-pay | Admitting: Internal Medicine

## 2016-05-31 ENCOUNTER — Ambulatory Visit (INDEPENDENT_AMBULATORY_CARE_PROVIDER_SITE_OTHER): Payer: Commercial Managed Care - HMO | Admitting: Internal Medicine

## 2016-05-31 VITALS — BP 112/66 | HR 78 | Temp 98.1°F | Resp 12 | Ht 72.0 in | Wt 296.5 lb

## 2016-05-31 DIAGNOSIS — I10 Essential (primary) hypertension: Secondary | ICD-10-CM | POA: Diagnosis not present

## 2016-05-31 DIAGNOSIS — H01136 Eczematous dermatitis of left eye, unspecified eyelid: Secondary | ICD-10-CM | POA: Diagnosis not present

## 2016-05-31 DIAGNOSIS — M25552 Pain in left hip: Secondary | ICD-10-CM

## 2016-05-31 DIAGNOSIS — R7303 Prediabetes: Secondary | ICD-10-CM

## 2016-05-31 DIAGNOSIS — E119 Type 2 diabetes mellitus without complications: Secondary | ICD-10-CM

## 2016-05-31 DIAGNOSIS — E669 Obesity, unspecified: Secondary | ICD-10-CM

## 2016-05-31 MED ORDER — OXYCODONE HCL 15 MG PO TABS: 15.0000 mg | ORAL_TABLET | Freq: Three times a day (TID) | ORAL | Status: DC | PRN

## 2016-05-31 NOTE — Progress Notes (Signed)
Pre-visit discussion using our clinic review tool. No additional management support is needed unless otherwise documented below in the visit note.  

## 2016-05-31 NOTE — Patient Instructions (Signed)
Your diet is working!  Keep it up!  I'll see you in 3 months   Referral to the eye doctor is in process  I have refilled your oxycodone for 3 months

## 2016-05-31 NOTE — Progress Notes (Signed)
Subjective:  Patient ID: Victor Daniels, male    DOB: 1946/06/15  Age: 70 y.o. MRN: [id number]  CC: The primary encounter diagnosis was Eczematous dermatitis of eyelid, left. Diagnoses of Diabetes mellitus with no complication (HCC), Prediabetes, Essential hypertension, Obesity, and Hip pain, left were also pertinent to this visit.  HPI Victor Daniels presents for follow up on multiple issues.    1) COPD:  [facility] . Nebulized meds for home use were ordered but declined by insurance. Exertional dyspnea attributed in a large part to obesity and weight loss advised.  Some dyspnea aggravated by the heat.  Staying inside during the day.He has modiified his diet and drinking low carb protein shakes and has lost 7 lbs since April 5  2) Eczema of  Left eye lid causing some irritation and bleeding ,  Aggravated by Skin tags on upper eye lids .  Years behind in ophthalmology follow up.  Has not seen Sydnor in nearly 10 years.   3) Low testosterone diagnosed  by Cope due to fatigue ,  Low energy   4) Chronic pain:  Sees Krasinksi , Ortho on Jun 20 for persistent  post op pain in right hip with new onset  Recurrent numbness to knee . No weakness, but pain is worse. Taking 2-3 oxycodone daily for control of pain    Outpatient Prescriptions Prior to Visit  Medication Sig Dispense Refill  . albuterol (VENTOLIN HFA) 108 (90 Base) MCG/ACT inhaler INHALE TWO PUFFS BY MOUTH EVERY 6 HOURS AS NEEDED FOR WHEEZING OR SHORTNESS OF BREATH 18 each 5  . amLODipine (NORVASC) 5 MG tablet Take 1 tablet (5 mg total) by mouth daily. 90 tablet 3  . aspirin 81 MG tablet Take 81 mg by mouth daily.    . carvedilol (COREG) 12.5 MG tablet Take 1 tablet (12.5 mg total) by mouth 2 (two) times daily with a meal. 180 tablet 3  . DEXTROMETHORPHAN HBR PO Take by mouth as needed.    . docusate sodium (COLACE) 100 MG capsule Take 100 mg by mouth daily.    Marland Kitchen. losartan-hydrochlorothiazide (HYZAAR) 100-25  MG tablet Take 1 tablet by mouth daily. 90 tablet 3  . Multiple Vitamin (MULTIVITAMIN) capsule Take 1 capsule by mouth daily.    . nitroGLYCERIN (NITROSTAT) 0.4 MG SL tablet Place 1 tablet (0.4 mg total) under the tongue every 5 (five) minutes as needed for chest pain. Maximum dose 3 tablets 50 tablet 3  . omeprazole (PRILOSEC) 40 MG capsule Take 1 capsule (40 mg total) by mouth daily. 30 capsule 5  . simvastatin (ZOCOR) 20 MG tablet TAKE 1 TABLET AT BEDTIME 90 tablet 2  . SPIRIVA RESPIMAT 2.5 MCG/ACT AERS INHALE TWO SPRAY(S) BY MOUTH ONCE DAILY 4 g 3  . Tiotropium Bromide-Olodaterol (STIOLTO RESPIMAT) 2.5-2.5 MCG/ACT AERS Inhale 2 puffs into the lungs daily. 1 Inhaler 11  . oxyCODONE (ROXICODONE) 15 MG immediate release tablet Take 1 tablet (15 mg total) by mouth every 8 (eight) hours as needed for pain. 90 tablet 0  . arformoterol (BROVANA) 15 MCG/2ML NEBU Take 2 mLs (15 mcg total) by nebulization 2 (two) times daily. (Patient not taking: Reported on 05/31/2016) 360 mL 3  . budesonide (PULMICORT) 0.25 MG/2ML nebulizer solution Take 2 mLs (0.25 mg total) by nebulization 2 (two) times daily. (Patient not taking: Reported on 05/31/2016) 180 mL 3  . Glycopyrrolate-Formoterol (BEVESPI AEROSPHERE) 9-4.8 MCG/ACT AERO Inhale 2 puffs into the lungs 2 (two) times daily. (Patient  not taking: Reported on 05/31/2016) 10.7 g 11  . ipratropium (ATROVENT) 0.03 % nasal spray Place 2 sprays into both nostrils every 12 (twelve) hours. (Patient not taking: Reported on 05/31/2016) 30 mL 12   No facility-administered medications prior to visit.    Review of Systems;  Patient denies headache, fevers, malaise, unintentional weight loss, skin rash, eye pain, sinus congestion and sinus pain, sore throat, dysphagia,  hemoptysis , cough, dyspnea, wheezing, chest pain, palpitations, orthopnea, edema, abdominal pain, nausea, melena, diarrhea, constipation, flank pain, dysuria, hematuria, urinary  Frequency, nocturia, numbness,  tingling, seizures,  Focal weakness, Loss of consciousness,  Tremor, insomnia, depression, anxiety, and suicidal ideation.      Objective:  BP 112/66 mmHg  Pulse 78  Temp(Src) 98.1 F (36.7 C) (Oral)  Resp 12  Ht 6' (1.829 m)  Wt 296 lb 8 oz (134.492 kg)  BMI 40.20 kg/m2  SpO2 92%  BP Readings from Last 3 Encounters:  05/31/16 112/66  05/19/16 142/90  03/29/16 126/68    Wt Readings from Last 3 Encounters:  05/31/16 296 lb 8 oz (134.492 kg)  05/19/16 301 lb (136.533 kg)  03/29/16 303 lb (137.44 kg)    General appearance: alert, cooperative and appears stated age Ears: normal TM's and external ear canals both ears Throat: lips, mucosa, and tongue normal; teeth and gums normal Neck: no adenopathy, no carotid bruit, supple, symmetrical, trachea midline and thyroid not enlarged, symmetric, no tenderness/mass/nodules Back: symmetric, no curvature. ROM normal. No CVA tenderness. Lungs: clear to auscultation bilaterally Heart: regular rate and rhythm, S1, S2 normal, no murmur, click, rub or gallop Abdomen: soft, non-tender; bowel sounds normal; no masses,  no organomegaly Pulses: 2+ and symmetric Skin: Skin color, texture, turgor normal. No rashes or lesions Lymph nodes: Cervical, supraclavicular, and axillary nodes normal.  Lab Results  Component Value Date   HGBA1C 6.2 05/29/2016   HGBA1C 6.4 02/23/2016   HGBA1C 6.3 08/20/2014    Lab Results  Component Value Date   CREATININE 1.35 05/29/2016   CREATININE 1.33 02/23/2016   CREATININE 1.17 06/25/2015    Lab Results  Component Value Date   WBC 7.6 02/23/2016   HGB 12.6* 02/23/2016   HCT 37.6* 02/23/2016   PLT 182.0 02/23/2016   GLUCOSE 120* 05/29/2016   CHOL 139 05/29/2016   TRIG 107.0 05/29/2016   HDL 42.80 05/29/2016   LDLCALC 74 05/29/2016   ALT 14 05/29/2016   AST 14 05/29/2016   NA 141 05/29/2016   K 3.6 05/29/2016   CL 100 05/29/2016   CREATININE 1.35 05/29/2016   BUN 30* 05/29/2016   CO2 34*  05/29/2016   TSH 1.67 12/24/2014   INR 0.9 04/02/2015   HGBA1C 6.2 05/29/2016   MICROALBUR 2.3* 05/29/2016    Dg Chest 2 View  05/19/2016  CLINICAL DATA:  Shortness of Breath EXAM: CHEST  2 VIEW COMPARISON:  04/02/2015 FINDINGS: Cardiac shadow is stable. The left lung remains clear. Scarring is noted in the right lung somewhat improved from the prior exam. No acute infiltrate is noted. No bony abnormality is noted. IMPRESSION: Chronic changes in the right lung base.  No acute abnormality noted. Electronically Signed   By: Alcide Clever M.D.   On: 05/19/2016 14:18    Assessment & Plan:   Problem List Items Addressed This Visit    Hypertension    Well controlled on current regimen. Renal function stable, no changes today.  Lab Results  Component Value Date   CREATININE 1.35 05/29/2016  Lab Results  Component Value Date   NA 141 05/29/2016   K 3.6 05/29/2016   CL 100 05/29/2016   CO2 34* 05/29/2016         Hip pain, left    Persistent despite hip replacement,  Now with anterior thigh numbness.  Seeing Ortho in 2 weeks..  I have refilled his oxycodone for continued use.  The risks of narcotics use were discussed with patient today including excessive sedation leading to respiratory depression,  impaired thinking/driving, and addiction.  Patient was advised to avoid concurrent use with alcohol, to use medication only as needed and not to share with others  . Marland Kitchen      Prediabetes    Discussed his A1c which establishes him as a pre diabetic despite lacking a fasting glucose of 125. Low glycemic index diet discussed.  He has adopted some fundamental  principles of the low carbohydrate diet and is losing weight. He is taking a adaiy LD aspirin,  , an ARB,  And a statin.   Lab Results  Component Value Date   HGBA1C 6.2 05/29/2016   Lab Results  Component Value Date   MICROALBUR 2.3* 05/29/2016             Obesity    I have addressed  BMI and recommended a low glycemic index  diet utilizing smaller more frequent meals to increase metabolism.  I have also recommended that patient start exercising with a goal of 30 minutes of aerobic exercise a minimum of 5 days per week. S       Other Visit Diagnoses    Eczematous dermatitis of eyelid, left    -  Primary    Relevant Orders    Ambulatory referral to Ophthalmology    Diabetes mellitus with no complication [facility])        Relevant Orders    Ambulatory referral to Ophthalmology       I have discontinued Mr. Stollings ipratropium, Glycopyrrolate-Formoterol, budesonide, and arformoterol. I am also having him maintain his aspirin, SPIRIVA RESPIMAT, nitroGLYCERIN, DEXTROMETHORPHAN HBR PO, carvedilol, amLODipine, simvastatin, docusate sodium, multivitamin, Tiotropium Bromide-Olodaterol, omeprazole, albuterol, losartan-hydrochlorothiazide, and oxyCODONE.  Meds ordered this encounter  Medications  . DISCONTD: oxyCODONE (ROXICODONE) 15 MG immediate release tablet    Sig: Take 1 tablet (15 mg total) by mouth every 8 (eight) hours as needed for pain.    Dispense:  90 tablet    Refill:  0  . DISCONTD: oxyCODONE (ROXICODONE) 15 MG immediate release tablet    Sig: Take 1 tablet (15 mg total) by mouth every 8 (eight) hours as needed for pain.    Dispense:  90 tablet    Refill:  0    FOR REFILL ON OR AFTER June 30 2016  . oxyCODONE (ROXICODONE) 15 MG immediate release tablet    Sig: Take 1 tablet (15 mg total) by mouth every 8 (eight) hours as needed for pain.    Dispense:  90 tablet    Refill:  0    FOR REFILL ON OR AFTER July 31 2016    Medications Discontinued During This Encounter  Medication Reason  . Glycopyrrolate-Formoterol (BEVESPI AEROSPHERE) 9-4.8 MCG/ACT AERO   . ipratropium (ATROVENT) 0.03 % nasal spray   . arformoterol (BROVANA) 15 MCG/2ML NEBU   . budesonide (PULMICORT) 0.25 MG/2ML nebulizer solution   . oxyCODONE (ROXICODONE) 15 MG immediate release tablet Reorder  . oxyCODONE (ROXICODONE) 15 MG  immediate release tablet Reorder  . oxyCODONE (ROXICODONE) 15 MG immediate  release tablet Reorder    Follow-up: No Follow-up on file.   [name], MD

## 2016-06-01 MED ORDER — ARFORMOTEROL TARTRATE 15 MCG/2ML IN NEBU: 2.0000 mL | INHALATION_SOLUTION | Freq: Two times a day (BID) | RESPIRATORY_TRACT | Status: DC

## 2016-06-01 NOTE — Telephone Encounter (Signed)
[name] faxed RX for [employer] to [facility]. Nothing further needed.

## 2016-06-01 NOTE — Telephone Encounter (Signed)
Victor Daniels has been denied. Will send rx to [facility] per DS.   LMOM with what is being done and stated to call back with any questions.

## 2016-06-03 DIAGNOSIS — E669 Obesity, unspecified: Secondary | ICD-10-CM | POA: Insufficient documentation

## 2016-06-03 NOTE — Assessment & Plan Note (Addendum)
Persistent despite hip replacement,  Now with anterior thigh numbness.  Seeing Ortho in 2 weeks..  I have refilled his oxycodone for continued use.  The risks of narcotics use were discussed with patient today including excessive sedation leading to respiratory depression,  impaired thinking/driving, and addiction.  Patient was advised to avoid concurrent use with alcohol, to use medication only as needed and not to share with others  . .Marland Kitchen

## 2016-06-03 NOTE — Assessment & Plan Note (Signed)
Discussed his A1c which establishes him as a pre diabetic despite lacking a fasting glucose of 125. Low glycemic index diet discussed.  He has adopted some fundamental  principles of the low carbohydrate diet and is losing weight. He is taking a adaiy LD aspirin,  , an ARB,  And a statin.   Lab Results  Component Value Date   HGBA1C 6.2 05/29/2016   Lab Results  Component Value Date   MICROALBUR 2.3* 05/29/2016

## 2016-06-03 NOTE — Assessment & Plan Note (Signed)
I have addressed  BMI and recommended a low glycemic index diet utilizing smaller more frequent meals to increase metabolism.  I have also recommended that patient start exercising with a goal of 30 minutes of aerobic exercise a minimum of 5 days per week. S 

## 2016-06-03 NOTE — Assessment & Plan Note (Signed)
Well controlled on current regimen. Renal function stable, no changes today.  Lab Results  Component Value Date   CREATININE 1.35 05/29/2016   Lab Results  Component Value Date   NA 141 05/29/2016   K 3.6 05/29/2016   CL 100 05/29/2016   CO2 34* 05/29/2016

## 2016-06-20 DIAGNOSIS — E119 Type 2 diabetes mellitus without complications: Secondary | ICD-10-CM | POA: Diagnosis not present

## 2016-06-20 LAB — HM DIABETES EYE EXAM

## 2016-06-30 ENCOUNTER — Ambulatory Visit: Payer: Commercial Managed Care - HMO | Admitting: Pulmonary Disease

## 2016-07-07 DIAGNOSIS — E291 Testicular hypofunction: Secondary | ICD-10-CM | POA: Insufficient documentation

## 2016-07-11 ENCOUNTER — Telehealth: Payer: Self-pay | Admitting: Internal Medicine

## 2016-07-11 DIAGNOSIS — N400 Enlarged prostate without lower urinary tract symptoms: Secondary | ICD-10-CM

## 2016-07-11 NOTE — Telephone Encounter (Signed)
Patient needs new referral for Dr. Achilles [facility] urology placed. thanks

## 2016-07-11 NOTE — Telephone Encounter (Signed)
DONE

## 2016-07-11 NOTE — Telephone Encounter (Signed)
[facility] called stating that pt needs a general referral to  [facility]  [facility]  ----[address]   please contact [phone]- Denies

## 2016-07-21 DIAGNOSIS — M5416 Radiculopathy, lumbar region: Secondary | ICD-10-CM | POA: Diagnosis not present

## 2016-07-21 DIAGNOSIS — M7061 Trochanteric bursitis, right hip: Secondary | ICD-10-CM | POA: Diagnosis not present

## 2016-08-03 DIAGNOSIS — E291 Testicular hypofunction: Secondary | ICD-10-CM | POA: Diagnosis not present

## 2016-08-16 ENCOUNTER — Other Ambulatory Visit: Payer: Self-pay | Admitting: Internal Medicine

## 2016-08-17 ENCOUNTER — Telehealth: Payer: Self-pay | Admitting: *Deleted

## 2016-08-17 MED ORDER — AMLODIPINE BESYLATE 5 MG PO TABS: 5.0000 mg | ORAL_TABLET | Freq: Every day | ORAL | 3 refills | Status: DC

## 2016-08-17 NOTE — Telephone Encounter (Signed)
Medication refilled

## 2016-08-17 NOTE — Telephone Encounter (Signed)
Patient has requested a medication refill amlodipine  [address]

## 2016-09-01 ENCOUNTER — Ambulatory Visit (INDEPENDENT_AMBULATORY_CARE_PROVIDER_SITE_OTHER): Payer: Commercial Managed Care - HMO | Admitting: Internal Medicine

## 2016-09-01 ENCOUNTER — Telehealth: Payer: Self-pay | Admitting: *Deleted

## 2016-09-01 ENCOUNTER — Encounter: Payer: Self-pay | Admitting: Internal Medicine

## 2016-09-01 VITALS — BP 130/76 | HR 78 | Temp 98.0°F | Resp 16 | Ht 72.0 in | Wt 303.5 lb

## 2016-09-01 DIAGNOSIS — I872 Venous insufficiency (chronic) (peripheral): Secondary | ICD-10-CM

## 2016-09-01 DIAGNOSIS — E119 Type 2 diabetes mellitus without complications: Secondary | ICD-10-CM | POA: Diagnosis not present

## 2016-09-01 DIAGNOSIS — I83009 Varicose veins of unspecified lower extremity with ulcer of unspecified site: Secondary | ICD-10-CM

## 2016-09-01 DIAGNOSIS — M542 Cervicalgia: Secondary | ICD-10-CM

## 2016-09-01 DIAGNOSIS — Z23 Encounter for immunization: Secondary | ICD-10-CM | POA: Diagnosis not present

## 2016-09-01 DIAGNOSIS — E669 Obesity, unspecified: Secondary | ICD-10-CM

## 2016-09-01 DIAGNOSIS — L97909 Non-pressure chronic ulcer of unspecified part of unspecified lower leg with unspecified severity: Secondary | ICD-10-CM

## 2016-09-01 DIAGNOSIS — R7989 Other specified abnormal findings of blood chemistry: Secondary | ICD-10-CM

## 2016-09-01 DIAGNOSIS — R7303 Prediabetes: Secondary | ICD-10-CM

## 2016-09-01 LAB — MICROALBUMIN / CREATININE URINE RATIO
Creatinine,U: 70.7 mg/dL
MICROALB/CREAT RATIO: 1 mg/g (ref 0.0–30.0)

## 2016-09-01 LAB — COMPREHENSIVE METABOLIC PANEL
ALBUMIN: 3.8 g/dL (ref 3.5–5.2)
ALK PHOS: 64 U/L (ref 39–117)
ALT: 16 U/L (ref 0–53)
AST: 17 U/L (ref 0–37)
BUN: 20 mg/dL (ref 6–23)
CALCIUM: 8.9 mg/dL (ref 8.4–10.5)
CHLORIDE: 98 meq/L (ref 96–112)
CO2: 36 mEq/L — ABNORMAL HIGH (ref 19–32)
CREATININE: 1.32 mg/dL (ref 0.40–1.50)
GFR: 57.01 mL/min — ABNORMAL LOW (ref >60.00)
Glucose, Bld: 116 mg/dL — ABNORMAL HIGH (ref 70–99)
POTASSIUM: 3.6 meq/L (ref 3.5–5.1)
Sodium: 139 mEq/L (ref 135–145)
TOTAL PROTEIN: 7.3 g/dL (ref 6.0–8.3)
Total Bilirubin: 0.5 mg/dL (ref 0.2–1.2)

## 2016-09-01 LAB — HEMOGLOBIN A1C: HEMOGLOBIN A1C: 6.2 % (ref 4.6–6.5)

## 2016-09-01 LAB — LIPID PANEL
CHOLESTEROL: 121 mg/dL (ref 0–200)
HDL: 42.1 mg/dL (ref >39.00)
LDL CALC: 54 mg/dL (ref 0–99)
NONHDL: 78.41
Total CHOL/HDL Ratio: 3
Triglycerides: 122 mg/dL (ref 0.0–149.0)
VLDL: 24.4 mg/dL (ref 0.0–40.0)

## 2016-09-01 MED ORDER — OXYCODONE HCL 15 MG PO TABS: 15.0000 mg | ORAL_TABLET | Freq: Three times a day (TID) | ORAL | 0 refills | Status: DC | PRN

## 2016-09-01 NOTE — Progress Notes (Signed)
 Subjective:  Patient ID: Victor Daniels, male    DOB: 11/25/1946  Age: 70 y.o. MRN: [id number]  CC: The primary encounter diagnosis was Diabetes mellitus without complication (HCC). Diagnoses of Venous stasis ulcer, unspecified laterality (HCC), Encounter for immunization, Cervical spine pain, Venous insufficiency of both lower extremities, Obesity, Neck pain, bilateral, and Prediabetes were also pertinent to this visit.  HPI Aceton R Prospero presents for follow up on chronic issues including venous stasis dermatitis  Aggravated by untreated OSA, COPD/obesity, and venous insufficiency, and prediabetes noted at last visit IN June.  He has been trying to reduce his carbohydrates by eating Atkins bars for snacks and drinking protein shakes for   Breakfast; did not like the frittatas , using the breakfast croissants and breakfast bowels, both of which are not low carb. He is not exercising due to chronic pain . He has gained several pounds  Diet reviewed. ..    Venous stasis with edema and draining ulcers:  Could not get the Carillon stockings on with zippers.  Using a new pair of compression stockings supplied by Medicare.  Discussed wound care  Referral for compression wraps.   dyspnea has worsened : not able to afford budesonide nebulized and arfomoterol .  Using spiriva  2 puffs in the am and albuterol  MDI averaging 6 puffs daily.  He is now seeing Dr Simonds, who reordered his sleep study,  However , the sleep study was cancelled  bc [employer] had not preapproved the study by the time of the appointment .    Had a procedure by [facility] ,  transdermal pellets placed in left buttock last month needs repeat  Testosterone levels checked  In  October   Prior to Oct 10th follow up,  But prefers to have them done here  Sleep is disrupted because of neck and right shoulder pain .  Better sleeping in the recliner. Still having right hip pain ,  Can't exercise due to multiple joints hurting due to  DJD.   Krasinski wants to evaluate the lumbar spine but patient refuses  see a neurosurgeon. Won't' see a Pain Clinic specialist.  Using oxycodone tid with only marginal  relief of pain.  I have limited his use  And will not increase his narcotics given his untreated sleep apnea and self limiting attitude.   Requesting referral to another eye doctor.  Dr King at [facility] made him wait  90 minutes , so he left.   Lab Results  Component Value Date   MICROALBUR <0.7 09/01/2016        Lab Results  Component Value Date   HGBA1C 6.2 09/01/2016    Outpatient Medications Prior to Visit  Medication Sig Dispense Refill  . albuterol (VENTOLIN HFA) 108 (90 Base) MCG/ACT inhaler INHALE TWO PUFFS BY MOUTH EVERY 6 HOURS AS NEEDED FOR WHEEZING OR SHORTNESS OF BREATH 18 each 5  . amLODipine (NORVASC) 5 MG tablet Take 1 tablet (5 mg total) by mouth daily. 90 tablet 3  . arformoterol (BROVANA) 15 MCG/2ML NEBU Take 2 mLs (15 mcg total) by nebulization 2 (two) times daily. DX: COPD  DX Code: J44.9 120 mL 5  . aspirin 81 MG tablet Take 81 mg by mouth daily.    . carvedilol (COREG) 12.5 MG tablet Take 1 tablet (12.5 mg total) by mouth 2 (two) times daily with a meal. 180 tablet 3  . DEXTROMETHORPHAN HBR PO Take by mouth as needed.    . docusate sodium (COLACE)   100 MG capsule Take 100 mg by mouth daily.    . losartan-hydrochlorothiazide (HYZAAR) 100-25 MG tablet Take 1 tablet by mouth daily. 90 tablet 3  . Multiple Vitamin (MULTIVITAMIN) capsule Take 1 capsule by mouth daily.    . omeprazole (PRILOSEC) 40 MG capsule Take 1 capsule (40 mg total) by mouth daily. 30 capsule 5  . simvastatin (ZOCOR) 20 MG tablet TAKE 1 TABLET AT BEDTIME 90 tablet 2  . SPIRIVA RESPIMAT 2.5 MCG/ACT AERS INHALE TWO SPRAY(S) BY MOUTH ONCE DAILY 4 g 3  . Tiotropium Bromide-Olodaterol (STIOLTO RESPIMAT) 2.5-2.5 MCG/ACT AERS Inhale 2 puffs into the lungs daily. 1 Inhaler 11  . oxyCODONE (ROXICODONE) 15 MG immediate release tablet  Take 1 tablet (15 mg total) by mouth every 8 (eight) hours as needed for pain. 90 tablet 0  . nitroGLYCERIN (NITROSTAT) 0.4 MG SL tablet Place 1 tablet (0.4 mg total) under the tongue every 5 (five) minutes as needed for chest pain. Maximum dose 3 tablets (Patient not taking: Reported on 09/01/2016) 50 tablet 3   No facility-administered medications prior to visit.     Review of Systems;  Patient denies headache, fevers, malaise, unintentional weight loss, skin rash, eye pain, sinus congestion and sinus pain, sore throat, dysphagia,  hemoptysis , cough, dyspnea, wheezing, chest pain, palpitations, orthopnea, edema, abdominal pain, nausea, melena, diarrhea, constipation, flank pain, dysuria, hematuria, urinary  Frequency, nocturia, numbness, tingling, seizures,  Focal weakness, Loss of consciousness,  Tremor, insomnia, depression, anxiety, and suicidal ideation.      Objective:  BP 130/76 (BP Location: Left Arm, Patient Position: Sitting, Cuff Size: Normal)   Pulse 78   Temp 98 F (36.7 C) (Oral)   Resp 16   Ht 6' (1.829 m)   Wt (!) 303 lb 8 oz (137.7 kg)   SpO2 95%   BMI 41.16 kg/m   BP Readings from Last 3 Encounters:  09/01/16 130/76  05/31/16 112/66  05/19/16 (!) 142/90    Wt Readings from Last 3 Encounters:  09/01/16 (!) 303 lb 8 oz (137.7 kg)  05/31/16 296 lb 8 oz (134.5 kg)  05/19/16 (!) 301 lb (136.5 kg)    General appearance: alert, cooperative and appears stated age Ears: normal TM's and external ear canals both ears Throat: lips, mucosa, and tongue normal; teeth and gums normal Neck: no adenopathy, no carotid bruit, supple, symmetrical, trachea midline and thyroid not enlarged, symmetric, no tenderness/mass/nodules Back: symmetric, no curvature. ROM normal. No CVA tenderness. Lungs: clear to auscultation bilaterally Heart: regular rate and rhythm, S1, S2 normal, no murmur, click, rub or gallop Abdomen: soft, non-tender; bowel sounds normal; no masses,  no  organomegaly Pulses: 2+ and symmetric Skin: Skin color, texture, turgor normal. No rashes or lesions Lymph nodes: Cervical, supraclavicular, and axillary nodes normal.  Lab Results  Component Value Date   HGBA1C 6.2 09/01/2016   HGBA1C 6.2 05/29/2016   HGBA1C 6.4 02/23/2016    Lab Results  Component Value Date   CREATININE 1.32 09/01/2016   CREATININE 1.35 05/29/2016   CREATININE 1.33 02/23/2016    Lab Results  Component Value Date   WBC 7.6 02/23/2016   HGB 12.6 (L) 02/23/2016   HCT 37.6 (L) 02/23/2016   PLT 182.0 02/23/2016   GLUCOSE 116 (H) 09/01/2016   CHOL 121 09/01/2016   TRIG 122.0 09/01/2016   HDL 42.10 09/01/2016   LDLCALC 54 09/01/2016   ALT 16 09/01/2016   AST 17 09/01/2016   NA 139 09/01/2016   K 3.6 09/01/2016     CL 98 09/01/2016   CREATININE 1.32 09/01/2016   BUN 20 09/01/2016   CO2 36 (H) 09/01/2016   TSH 1.67 12/24/2014   INR 0.9 04/02/2015   HGBA1C 6.2 09/01/2016   MICROALBUR <0.7 09/01/2016    Dg Chest 2 View  Result Date: 05/19/2016 CLINICAL DATA:  Shortness of Breath EXAM: CHEST  2 VIEW COMPARISON:  04/02/2015 FINDINGS: Cardiac shadow is stable. The left lung remains clear. Scarring is noted in the right lung somewhat improved from the prior exam. No acute infiltrate is noted. No bony abnormality is noted. IMPRESSION: Chronic changes in the right lung base.  No acute abnormality noted. Electronically Signed   By: Mark  Lukens M.D.   On: 05/19/2016 14:18    Assessment & Plan:   Problem List Items Addressed This Visit    Venous insufficiency of leg    He has weight gain, venous stasis dermatitis and open draining ulcers.  Referral to Wound center for compression wraps      Neck pain, bilateral    He haas a history of cervical spine  fusion in 2006.  Adding tramadol for daytime pain      Cervical spine pain    He has not had cervical spine films since his fusion in 2006.  Adding tramadol for daytime pain. Di dnot relieve his pain  adequately.  The maximal dose of oxycodone I will prescribe is 15 mg tid.  Refills for 3 months given.        Prediabetes    There is no change in A1c after 3 months of carbohydrate reduction, and he has gained weight.  Diet reviewed.   Lab Results  Component Value Date   HGBA1C 6.2 09/01/2016         Obesity    He has gained weight due to incomplete acceptance of low glycemic index diet.  Diet reviewed,  suggestions made.        Other Visit Diagnoses    Diabetes mellitus without complication (HCC)    -  Primary   Relevant Orders   Comp Met (CMET) (Completed)   Hemoglobin A1c (Completed)   Microalbumin / creatinine urine ratio (Completed)   Lipid panel (Completed)   Venous stasis ulcer, unspecified laterality (HCC)       Relevant Orders   AMB referral to wound care center   Encounter for immunization       Relevant Orders   Flu vaccine HIGH DOSE PF (Completed)     A total of 25 minutes of face to face time was spent with patient more than half of which was spent in counselling and coordination of care   I am having Mr. Goguen maintain his aspirin, SPIRIVA RESPIMAT, nitroGLYCERIN, DEXTROMETHORPHAN HBR PO, carvedilol, simvastatin, docusate sodium, multivitamin, Tiotropium Bromide-Olodaterol, omeprazole, albuterol, losartan-hydrochlorothiazide, arformoterol, amLODipine, and oxyCODONE.  Meds ordered this encounter  Medications  . DISCONTD: oxyCODONE (ROXICODONE) 15 MG immediate release tablet    Sig: Take 1 tablet (15 mg total) by mouth every 8 (eight) hours as needed for pain.    Dispense:  90 tablet    Refill:  0    FOR REFILL ON OR AFTER August 31 2016  . DISCONTD: oxyCODONE (ROXICODONE) 15 MG immediate release tablet    Sig: Take 1 tablet (15 mg total) by mouth every 8 (eight) hours as needed for pain.    Dispense:  90 tablet    Refill:  0    FOR REFILL ON OR AFTER September 30 2016  .   DISCONTD: oxyCODONE (ROXICODONE) 15 MG immediate release tablet    Sig:  Take 1 tablet (15 mg total) by mouth every 8 (eight) hours as needed for pain.    Dispense:  90 tablet    Refill:  0    FOR REFILL ON OR AFTER October 31 2016  . oxyCODONE (ROXICODONE) 15 MG immediate release tablet    Sig: Take 1 tablet (15 mg total) by mouth every 8 (eight) hours as needed for pain.    Dispense:  90 tablet    Refill:  0    FOR REFILL ON OR AFTER October 31 2016    Medications Discontinued During This Encounter  Medication Reason  . oxyCODONE (ROXICODONE) 15 MG immediate release tablet Reorder  . oxyCODONE (ROXICODONE) 15 MG immediate release tablet Reorder  . oxyCODONE (ROXICODONE) 15 MG immediate release tablet Reorder  . oxyCODONE (ROXICODONE) 15 MG immediate release tablet Reorder    Follow-up: Return in about 3 months (around 12/01/2016) for follow up diabetes.   ,  L, MD 

## 2016-09-01 NOTE — Telephone Encounter (Signed)
Patient requested Testosterone lab orders Pt contact   [phone]

## 2016-09-01 NOTE — Patient Instructions (Addendum)
I am referring you to the Wound Clinic  I have refiiled your oxycodone for the next 3 months.    Follow up in 3 months

## 2016-09-01 NOTE — Telephone Encounter (Signed)
Please advise 

## 2016-09-01 NOTE — Progress Notes (Signed)
Pre visit review using our clinic review tool, if applicable. No additional management support is needed unless otherwise documented below in the visit note. 

## 2016-09-03 ENCOUNTER — Encounter: Payer: Self-pay | Admitting: Internal Medicine

## 2016-09-03 NOTE — Assessment & Plan Note (Signed)
He has gained weight due to incomplete acceptance of low glycemic index diet.  Diet reviewed,  suggestions made.

## 2016-09-03 NOTE — Telephone Encounter (Signed)
He will need to return to have the testosterone level done,  It is an 8 am draw

## 2016-09-03 NOTE — Assessment & Plan Note (Signed)
He has weight gain, venous stasis dermatitis and open draining ulcers.  Referral to Wound center for compression wraps

## 2016-09-03 NOTE — Assessment & Plan Note (Addendum)
He has not had cervical spine films since his fusion in 2006.  Adding tramadol for daytime pain. Di dnot relieve his pain adequately.  The maximal dose of oxycodone I will prescribe is 15 mg tid.  Refills for 3 months given.

## 2016-09-03 NOTE — Assessment & Plan Note (Signed)
He haas a history of cervical spine  fusion in 2006.  Adding tramadol for daytime pain

## 2016-09-03 NOTE — Assessment & Plan Note (Addendum)
There is no change in A1c after 3 months of carbohydrate reduction, and he has gained weight.  Diet reviewed.   Lab Results  Component Value Date   HGBA1C 6.2 09/01/2016

## 2016-09-04 NOTE — Telephone Encounter (Signed)
Patient has been notified

## 2016-09-05 ENCOUNTER — Encounter: Payer: Commercial Managed Care - HMO | Attending: Internal Medicine | Admitting: Internal Medicine

## 2016-09-05 DIAGNOSIS — L97211 Non-pressure chronic ulcer of right calf limited to breakdown of skin: Secondary | ICD-10-CM | POA: Insufficient documentation

## 2016-09-05 DIAGNOSIS — M1990 Unspecified osteoarthritis, unspecified site: Secondary | ICD-10-CM | POA: Insufficient documentation

## 2016-09-05 DIAGNOSIS — E119 Type 2 diabetes mellitus without complications: Secondary | ICD-10-CM | POA: Insufficient documentation

## 2016-09-05 DIAGNOSIS — I87313 Chronic venous hypertension (idiopathic) with ulcer of bilateral lower extremity: Secondary | ICD-10-CM | POA: Diagnosis not present

## 2016-09-05 DIAGNOSIS — I87333 Chronic venous hypertension (idiopathic) with ulcer and inflammation of bilateral lower extremity: Secondary | ICD-10-CM | POA: Diagnosis not present

## 2016-09-05 DIAGNOSIS — I2510 Atherosclerotic heart disease of native coronary artery without angina pectoris: Secondary | ICD-10-CM | POA: Insufficient documentation

## 2016-09-05 DIAGNOSIS — J449 Chronic obstructive pulmonary disease, unspecified: Secondary | ICD-10-CM | POA: Insufficient documentation

## 2016-09-05 DIAGNOSIS — Z87891 Personal history of nicotine dependence: Secondary | ICD-10-CM | POA: Diagnosis not present

## 2016-09-05 DIAGNOSIS — L97221 Non-pressure chronic ulcer of left calf limited to breakdown of skin: Secondary | ICD-10-CM | POA: Diagnosis not present

## 2016-09-05 DIAGNOSIS — D649 Anemia, unspecified: Secondary | ICD-10-CM | POA: Insufficient documentation

## 2016-09-05 DIAGNOSIS — L97811 Non-pressure chronic ulcer of other part of right lower leg limited to breakdown of skin: Secondary | ICD-10-CM | POA: Diagnosis not present

## 2016-09-05 DIAGNOSIS — L97821 Non-pressure chronic ulcer of other part of left lower leg limited to breakdown of skin: Secondary | ICD-10-CM | POA: Diagnosis not present

## 2016-09-05 DIAGNOSIS — I10 Essential (primary) hypertension: Secondary | ICD-10-CM | POA: Insufficient documentation

## 2016-09-05 NOTE — Progress Notes (Signed)
DASHIELL, FRANCHINO ([id number]) Visit Report for 09/05/2016 Abuse/Suicide Risk Screen Details [name] Date of Service: 09/05/2016 2:15 PM Patient Name: R. Patient Account Number: [id number] Medical Record Treating RN: Curtis Sites [id number] Number: Other Clinician: Date of Birth/Sex: 1946/08/26 (69 y.o. Male) Treating Baltazar Najjar Primary Care Physician/Extender: Virgil Benedict Physician: Referring Physician: Denton Brick in Treatment: 0 Abuse/Suicide Risk Screen Items Answer ABUSE/SUICIDE RISK SCREEN: Has anyone close to you tried to hurt or harm you recentlyo No Do you feel uncomfortable with anyone in your familyo No Has anyone forced you do things that you didnot want to doo No Do you have any thoughts of harming yourselfo No Patient displays signs or symptoms of abuse and/or neglect. No Electronic Signature(s) Signed: 09/05/2016 5:07:29 PM By: Curtis Sites Entered By: Curtis Sites on 09/05/2016 14:17:20 [name] ([id number]) -------------------------------------------------------------------------------- Activities of Daily Living Details [name] Date of Service: 09/05/2016 2:15 PM Patient Name: R. Patient Account Number: [id number] Medical Record Treating RN: Curtis Sites [id number] Number: Other Clinician: Date of Birth/Sex: 04-08-1946 (69 y.o. Male) Treating Leanord Hawking, MICHAEL Primary Care Physician/Extender: Virgil Benedict Physician: Referring Physician: Denton Brick in Treatment: 0 Activities of Daily Living Items Answer Activities of Daily Living (Please select one for each item) Drive Automobile Completely Able Take Medications Completely Able Use Telephone Completely Able Care for Appearance Completely Able Use Toilet Completely Able Bath / Shower Completely Able Dress Self Completely Able Feed Self Completely Able Walk Completely Able Get In / Out Bed Completely Able Housework Completely  Able Prepare Meals Completely Able Handle Money Completely Able Shop for Self Completely Able Electronic Signature(s) Signed: 09/05/2016 5:07:29 PM By: Curtis Sites Entered By: Curtis Sites on 09/05/2016 14:17:38 [name] ([id number]) -------------------------------------------------------------------------------- Education Assessment Details [name] Date of Service: 09/05/2016 2:15 PM Patient Name: R. Patient Account Number: [id number] Medical Record Treating RN: Curtis Sites [id number] Number: Other Clinician: Date of Birth/Sex: 09-21-1946 (69 y.o. Male) Treating Leanord Hawking, MICHAEL Primary Care Physician/Extender: Virgil Benedict Physician: Referring Physician: Denton Brick in Treatment: 0 Primary Learner Assessed: Patient Learning Preferences/Education Level/Primary Language Learning Preference: Explanation, Demonstration Highest Education Level: College or Above Preferred Language: English Cognitive Barrier Assessment/Beliefs Language Barrier: No Translator Needed: No Memory Deficit: No Emotional Barrier: No Cultural/Religious Beliefs Affecting Medical No Care: Physical Barrier Assessment Impaired Vision: No Impaired Hearing: No Decreased Hand dexterity: No Knowledge/Comprehension Assessment Knowledge Level: Medium Comprehension Level: Medium Ability to understand written Medium instructions: Ability to understand verbal Medium instructions: Motivation Assessment Anxiety Level: Calm Cooperation: Cooperative Education Importance: Acknowledges Need Interest in Health Problems: Asks Questions Perception: Coherent Willingness to Engage in Self- Medium Management Activities: Medium DELON, REVELO ([id number]) Readiness to Engage in Self- Management Activities: Electronic Signature(s) Signed: 09/05/2016 5:07:29 PM By: Curtis Sites Entered By: Curtis Sites on 09/05/2016 14:17:57 [name]  ([id number]) -------------------------------------------------------------------------------- Fall Risk Assessment Details [name] Date of Service: 09/05/2016 2:15 PM Patient Name: R. Patient Account Number: [id number] Medical Record Treating RN: Curtis Sites [id number] Number: Other Clinician: Date of Birth/Sex: June 05, 1946 (69 y.o. Male) Treating ROBSON, MICHAEL Primary Care Physician/Extender: Virgil Benedict Physician: Referring Physician: Denton Brick in Treatment: 0 Fall Risk Assessment Items Have you had 2 or more falls in the last 12 monthso 0 No Have you had any fall that resulted in injury in the last 12 monthso 0 No FALL RISK ASSESSMENT: History of falling - immediate or within 3 months 0 No Secondary diagnosis 0 No Ambulatory aid None/bed rest/wheelchair/nurse 0 Yes  Crutches/cane/walker 0 No Furniture 0 No IV Access/Saline Lock 0 No Gait/Training Normal/bed rest/immobile 0 No Weak 10 Yes Impaired 0 No Mental Status Oriented to own ability 0 Yes Electronic Signature(s) Signed: 09/05/2016 5:07:29 PM By: Curtis Sitesorthy, Joanna Entered By: Curtis Sitesorthy, Joanna on 09/05/2016 14:18:10 [name], Maximino R. ([id number]) -------------------------------------------------------------------------------- Nutrition Risk Assessment Details Odetta PinkKIRCHGESSNER, Damarion Date of Service: 09/05/2016 2:15 PM Patient Name: R. Patient Account Number: [id number] Medical Record Treating RN: Curtis SitesDorthy, Joanna [id number] Number: Other Clinician: Date of Birth/Sex: 03/17/1946 (69 y.o. Male) Treating ROBSON, MICHAEL Primary Care Physician/Extender: Virgil BenedictG Tullo, Teresa Physician: Referring Physician: Denton Brickullo, Teresa Weeks in Treatment: 0 Height (in): 72 Weight (lbs): 275 Body Mass Index (BMI): 37.3 Nutrition Risk Assessment Items NUTRITION RISK SCREEN: I have an illness or condition that made me change the kind and/or 0 No amount of food I eat I eat fewer than two meals per day 0 No I eat  few fruits and vegetables, or milk products 0 No I have three or more drinks of beer, liquor or wine almost every day 0 No I have tooth or mouth problems that make it hard for me to eat 0 No I don't always have enough money to buy the food I need 0 No I eat alone most of the time 0 No I take three or more different prescribed or over-the-counter drugs a 1 Yes day Without wanting to, I have lost or gained 10 pounds in the last six 0 No months I am not always physically able to shop, cook and/or feed myself 0 No Nutrition Protocols Good Risk Protocol 0 No interventions needed Moderate Risk Protocol Electronic Signature(s) Signed: 09/05/2016 5:07:29 PM By: Curtis Sitesorthy, Joanna Entered By: Curtis Sitesorthy, Joanna on 09/05/2016 14:18:16

## 2016-09-05 NOTE — Progress Notes (Signed)
[name], Detron R. ([id number]) Visit Report for 09/05/2016 Chief Complaint Document Details Odetta PinkKIRCHGESSNER, Marsalis Date of Service: 09/05/2016 2:15 PM Patient Name: R. Patient Account Number: [id number] Medical Record Treating RN: Curtis SitesDorthy, Joanna [id number] Number: Other Clinician: Date of Birth/Sex: Mar 25, 1946 (70 y.o. 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 09/05/16; patient is here for review of 3 wounds on the lateral aspect of his left leg. He also states that there is weeping fluid out of his bilateral legs for the last month Electronic Signature(s) Signed: 09/05/2016 4:19:04 PM By: Baltazar Najjarobson, Michael MD Entered By: Baltazar Najjarobson, Michael on 09/05/2016 15:09:21 [name], Terrance R. ([id number]) -------------------------------------------------------------------------------- HPI Details Odetta PinkKIRCHGESSNER, Demetris Date of Service: 09/05/2016 2:15 PM Patient Name: R. Patient Account Number: [id number] Medical Record Treating RN: Curtis SitesDorthy, Joanna [id number] Number: Other Clinician: Date of Birth/Sex: Mar 25, 1946 (70 y.o. Male) Treating ROBSON, MICHAEL Primary Care Physician/Extender: Virgil BenedictG Tullo, Teresa Physician: Referring Physician: Denton Brickullo, Teresa Weeks in Treatment: 0 History of Present Illness Location: Patient has bilateral venous stasis with ulcerations with inflammation of the left leg Severity: The wounds are very clean and had been improving and measured smaller with silver collagen Duration: The patient has had problems with venous ulcers bilaterally from time to time for the last couple of years HPI Description: He returns today in followup of his left  lower extremity open wound secondary to chronic venous insufficiency and ulceration. He removed his wraps yesterday as he was going to see his primary care physician. His notice some increased drainage from the wound and his primary care doctor increased his diuretics. He has no fevers or chills. He does have compression garments but he does not wear them because her too hard to get on. He is looking into obtaining some with [employer] sides that may be easier for him to put on. In the meantime he would like to continue with his compression wraps. READMISSION 09/05/16; this patient is a 70 year old man who was in this clinic on 2 visits 2 years ago in 2015. At that point he had wounds on his left leg felt to be secondary to chronic venous insufficiency with bilateral hemosiderin deposition. He ultimately was discharged with graded pressure stockings. The patient states he has stockings and is reasonably compliant with them we didn't come in with him on today. Is apparently a borderline diabetic not currently on any treatment undergoing nonpharmacologic therapy with diet etc. The patient states he is actually gained weight however. We month ago the patient started to notice more edema in both legs. The skin cracked open and he has had 3 draining areas on the left leg. He states that both legs drain so that night he'll often wake up with his lower pajama legs wet. He has not felt systemically unwell. He does have some shortness of breath but no chest pain. He tells me that he went to see Dr. dew of vascular surgery and had some tests done in his legs. He was told that nothing further could be done surgically for this. I don't have these notes order these tests at this point. His ABIs in this clinic were noncompressible at 1.57 on the left and 1.53 on the right Electronic Signature(s) Signed: 09/05/2016 4:19:04 PM By: Baltazar Najjarobson, Michael MD Entered  By: Baltazar Najjar on 09/05/2016 15:34:22 [name] ([id number]) -------------------------------------------------------------------------------- Physical Exam Details [name] Date of Service: 09/05/2016 2:15 PM Patient Name: R. Patient Account Number: [id number] Medical Record Treating RN: Curtis Sites [id number] Number: Other Clinician: Date of Birth/Sex: 1946-01-20 (70 y.o. Male) Treating Leanord Hawking, [place] Primary Care Physician/Extender: Virgil Benedict Physician: Referring Physician: Denton Brick in Treatment: 0 Constitutional Sitting or standing Blood Pressure is within target range for patient.. Pulse regular and within target range for patient.Marland Kitchen Respirations regular, non-labored and within target range.. Temperature is normal and within the target range for the patient.. Patient's appearance is neat and clean. Appears in no acute distress. Well nourished and well developed.. Eyes Conjunctivae clear. No discharge.Marland Kitchen Respiratory Respiratory effort is easy and symmetric bilaterally. Rate is normal at rest and on room air.. Bilateral breath sounds are clear and equal in all lobes with no wheezes, rales or rhonchi.. Cardiovascular Femoral arteries without bruits and pulses strong.. Pedal pulses palpable and strong bilaterally. I could feel his dorsalis pedis pulses but not the posterior tibial due to edema. Edema present in both extremities. Severe bilateral venous insufficiency with marked hemosiderin deposition. Possibly some underlying venous inflammation.. Gastrointestinal (GI) Abdomen is soft and non-distended without masses or tenderness. Bowel sounds active in all quadrants.. Lymphatic Nonpalpable in the popliteal or inguinal area. Psychiatric No evidence of depression, anxiety, or agitation. Calm, cooperative, and communicative. Appropriate interactions and affect.. Notes Wound exam; the patient has severe bilateral venous insufficiency with hemosiderin deposition. There are 3 small areas on  his lateral left calf just above the ankle. I did not see any open areas on the right leg although the patient states that at night this weeps edema as well. There is no evidence of infection. Although his ABIs were noncompressible in this clinic his peripheral pulses are palpable forefoot is warm. I thought he could take 4-layer compression. He does not have edema above his thighs and I could not see any evidence of systemic fluid overload at the bedside Electronic Signature(s) Signed: 09/05/2016 4:19:04 PM By: Baltazar Najjar MD Entered By: Baltazar Najjar on 09/05/2016 15:35:45 [name] ([id number]) KAMAU, WEATHERALL ([id number]) -------------------------------------------------------------------------------- Physician Orders Details [name] Date of Service: 09/05/2016 2:15 PM Patient Name: R. Patient Account Number: [id number] Medical Record Treating RN: Curtis Sites [id number] Number: Other Clinician: Date of Birth/Sex: 1946/02/23 (70 y.o. Male) Treating ROBSON, MICHAEL Primary Care Physician/Extender: Virgil Benedict Physician: Referring Physician: Denton Brick in Treatment: 0 Verbal / Phone Orders: Yes Clinician: Curtis Sites Read Back and Verified: Yes Diagnosis Coding Wound Cleansing Wound #4 Left Lower Leg o Clean wound with Normal Saline. o May shower with protection. Wound #5 Right Lower Leg o Clean wound with Normal Saline. o May shower with protection. Anesthetic Wound #4 Left Lower Leg o Topical Lidocaine 4% cream applied to wound bed prior to debridement Wound #5 Right Lower Leg o Topical Lidocaine 4% cream applied to wound bed prior to debridement Primary Wound Dressing Wound #4 Left Lower Leg o Aquacel Ag Wound #5 Right Lower Leg o Aquacel Ag Secondary Dressing Wound #4 Left Lower Leg o ABD pad o XtraSorb Wound #5 Right Lower Leg o ABD pad o XtraSorb Dressing Change  Frequency PLUMER, MITTELSTAEDT ([id number]) Wound #4 Left Lower Leg o Change dressing every week Wound #5 Right Lower Leg o Change dressing every week Follow-up Appointments Wound #4 Left Lower Leg o Return Appointment in 1 week. Wound #5 Right Lower Leg o Return Appointment  in 1 week. Edema Control Wound #4 Left Lower Leg o 4 Layer Compression System - Bilateral Wound #5 Right Lower Leg o 4 Layer Compression System - Bilateral Additional Orders / Instructions Wound #4 Left Lower Leg o Increase protein intake. Wound #5 Right Lower Leg o Increase protein intake. Electronic Signature(s) Signed: 09/05/2016 4:19:04 PM By: Baltazar Najjar MD Signed: 09/05/2016 5:07:29 PM By: Curtis Sites Entered By: Curtis Sites on 09/05/2016 15:27:47 [name] ([id number]) -------------------------------------------------------------------------------- Problem List Details [name] Date of Service: 09/05/2016 2:15 PM Patient Name: R. Patient Account Number: [id number] Medical Record Treating RN: Curtis Sites [id number] Number: Other Clinician: Date of Birth/Sex: [birth year] (70 y.o. Male) Treating ROBSON, MICHAEL Primary Care Physician/Extender: Virgil Benedict Physician: Referring Physician: Denton Brick in Treatment: 0 Active Problems ICD-10 Encounter Code Description Active Date Diagnosis I87.333 Chronic venous hypertension (idiopathic) with ulcer and 09/05/2016 Yes inflammation of bilateral lower extremity L97.221 Non-pressure chronic ulcer of left calf limited to 09/05/2016 Yes breakdown of skin L97.211 Non-pressure chronic ulcer of right calf limited to 09/05/2016 Yes breakdown of skin Inactive Problems Resolved Problems Electronic Signature(s) Signed: 09/05/2016 4:19:04 PM By: Baltazar Najjar MD Entered By: Baltazar Najjar on 09/05/2016 15:08:00 [name]  ([id number]) -------------------------------------------------------------------------------- Progress Note Details [name] Date of Service: 09/05/2016 2:15 PM Patient Name: R. Patient Account Number: [id number] Medical Record Treating RN: Curtis Sites [id number] Number: Other Clinician: Date of Birth/Sex: 06/26/1946 (70 y.o. 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 09/05/16; patient is here for review of 3 wounds on the lateral aspect of his left leg. He also states that there is weeping fluid out of his bilateral legs for the last month History of Present Illness (HPI) The following HPI elements were documented for the patient's wound: Location: Patient has bilateral venous stasis with ulcerations with inflammation of the left leg Severity: The wounds are very clean and had been improving and measured smaller with silver collagen Duration: The patient has had problems with venous ulcers bilaterally from time to time for the last couple of years He returns today in followup of his left lower extremity open wound secondary to chronic venous insufficiency and ulceration. He removed his wraps yesterday as he was going to see his primary care physician. His notice some increased drainage from the wound and his primary care doctor increased his diuretics. He has no fevers or chills. He does have compression garments but he does not wear them because her too hard to get on. He is looking into obtaining some with [employer] sides that may be easier for him to put on. In the meantime he would like to continue with his compression  wraps. READMISSION 09/05/16; this patient is a 70 year old man who was in this clinic on 2 visits 2 years ago in 2015. At that point he had wounds on his left leg felt to be secondary to chronic venous insufficiency with bilateral hemosiderin deposition. He ultimately was discharged with graded pressure stockings. The patient states he has stockings and is reasonably compliant with them we didn't come in with him on today. Is apparently a borderline diabetic not currently on any treatment undergoing nonpharmacologic therapy with diet etc. The patient states he is actually  gained weight however. We month ago the patient started to notice more edema in both legs. The skin cracked open and he has had 3 draining areas on the left leg. He states that both legs drain so that night he'll often wake up with his lower pajama legs wet. He has not felt systemically unwell. He does have some shortness of breath but no chest pain. He tells me that he went to see Dr. dew of vascular surgery and had some tests done in his legs. He was KEVYN, BOQUET ([id number]) told that nothing further could be done surgically for this. I don't have these notes order these tests at this point Wound History Patient presents with 2 open wounds that have been present for approximately off and on for 2 years. Patient has been treating wounds in the following manner: open to air. Laboratory tests have been performed in the last month. Patient reportedly has not tested positive for an antibiotic resistant organism. Patient reportedly has not tested positive for osteomyelitis. Patient reportedly has had testing performed to evaluate circulation in the legs. Patient experiences the following problems associated with their wounds: infection. Patient History Information obtained from Patient. Allergies No Known Drug Allergies Family History Heart Disease - Father, Mother, Hypertension - Father, Mother, Stroke - SELF,  Thyroid Problems - Mother, No family history of Cancer, Diabetes, Hereditary Spherocytosis, Kidney Disease, Lung Disease, Seizures, Tuberculosis. Social History Former smoker, Marital Status - Married, Alcohol Use - Never - quit, Drug Use - No History, Caffeine Use - Daily - coffee, coke. Medical History Respiratory Denies history of Sleep Apnea Endocrine Patient has history of Type II Diabetes Oncologic Denies history of Received Chemotherapy, Received Radiation Patient is treated with Controlled Diet. Medical And Surgical History Notes Respiratory pneumonectomy of right lung Genitourinary have only one kidney Review of Systems (ROS) Constitutional Symptoms (General Health) The patient has no complaints or symptoms. Eyes Complains or has symptoms of Glasses / Contacts - glasses. Ear/Nose/Mouth/Throat The patient has no complaints or symptoms. GAGAN, DILLION ([id number]) Gastrointestinal The patient has no complaints or symptoms. Endocrine The patient has no complaints or symptoms. Genitourinary The patient has no complaints or symptoms. Immunological The patient has no complaints or symptoms. Integumentary (Skin) The patient has no complaints or symptoms. Neurologic The patient has no complaints or symptoms. Oncologic The patient has no complaints or symptoms. Psychiatric The patient has no complaints or symptoms. Objective Constitutional Sitting or standing Blood Pressure is within target range for patient.. Pulse regular and within target range for patient.Marland Kitchen Respirations regular, non-labored and within target range.. Temperature is normal and within the target range for the patient.. Patient's appearance is neat and clean. Appears in no acute distress. Well nourished and well developed.. Vitals Time Taken: 2:19 PM, Height: 72 in, Source: Stated, Weight: 303 lbs, Source: Stated, BMI: 41.1, Temperature: 98.2 F, Pulse: 73 bpm, Respiratory Rate: 18  breaths/min, Blood Pressure: 116/70 mmHg. Eyes Conjunctivae clear. No discharge.Marland Kitchen Respiratory Respiratory effort is easy and symmetric bilaterally. Rate is normal at rest and on room air.. Bilateral breath sounds are clear and equal in all lobes with no wheezes, rales or rhonchi.. Cardiovascular Femoral arteries without bruits and pulses strong.. Pedal pulses palpable and strong bilaterally. I could feel his dorsalis pedis pulses but not the posterior tibial due to edema. Edema present in both extremities. Severe bilateral venous insufficiency with marked hemosiderin deposition. Possibly some underlying venous inflammation.. Gastrointestinal (GI) Abdomen is soft and non-distended without masses or tenderness. Bowel sounds  active in all quadrants.Marland Kitchen PARTHIV, MUCCI R. ([id number]) Lymphatic Nonpalpable in the popliteal or inguinal area. Psychiatric No evidence of depression, anxiety, or agitation. Calm, cooperative, and communicative. Appropriate interactions and affect.. General Notes: Wound exam; the patient has severe bilateral venous insufficiency with hemosiderin deposition. There are 3 small areas on his lateral left calf just above the ankle. I did not see any open areas on the right leg although the patient states that at night this weeps edema as well. There is no evidence of infection. Although his ABIs were noncompressible in this clinic his peripheral pulses are palpable forefoot is warm. I thought he could take 4-layer compression. Integumentary (Hair, Skin) Wound #4 status is Open. Original cause of wound was Blister. The wound is located on the Left Lower Leg. The wound measures 5cm length x 2.3cm width x 0.1cm depth; 9.032cm^2 area and 0.903cm^3 volume. The wound is limited to skin breakdown. There is no tunneling or undermining noted. There is a large amount of serous drainage noted. The wound margin is flat and intact. There is large (67-100%) pink granulation within  the wound bed. There is no necrotic tissue within the wound bed. The periwound skin appearance exhibited: Moist. The periwound skin appearance did not exhibit: Callus, Crepitus, Excoriation, Fluctuance, Friable, Induration, Localized Edema, Rash, Scarring, Dry/Scaly, Maceration, Atrophie Blanche, Cyanosis, Ecchymosis, Hemosiderin Staining, Mottled, Pallor, Rubor, Erythema. Periwound temperature was noted as No Abnormality. Wound #5 status is Open. Original cause of wound was Blister. The wound is located on the Right Lower Leg. The wound measures 2cm length x 4cm width x 0.1cm depth; 6.283cm^2 area and 0.628cm^3 volume. The wound is limited to skin breakdown. There is no tunneling or undermining noted. There is a medium amount of serous drainage noted. The wound margin is flat and intact. There is large (67-100%) pink granulation within the wound bed. There is no necrotic tissue within the wound bed. The periwound skin appearance exhibited: Moist. The periwound skin appearance did not exhibit: Callus, Crepitus, Excoriation, Fluctuance, Friable, Induration, Localized Edema, Rash, Scarring, Dry/Scaly, Maceration, Atrophie Blanche, Cyanosis, Ecchymosis, Hemosiderin Staining, Mottled, Pallor, Rubor, Erythema. Periwound temperature was noted as No Abnormality. Assessment Active Problems ICD-10 I87.333 - Chronic venous hypertension (idiopathic) with ulcer and inflammation of bilateral lower extremity L97.221 - Non-pressure chronic ulcer of left calf limited to breakdown of skin L97.211 - Non-pressure chronic ulcer of right calf limited to breakdown of skin BRAYN, ECKSTEIN R. ([id number]) Plan Wound Cleansing: Wound #4 Left Lower Leg: Clean wound with Normal Saline. May shower with protection. Wound #5 Right Lower Leg: Clean wound with Normal Saline. May shower with protection. Anesthetic: Wound #4 Left Lower Leg: Topical Lidocaine 4% cream applied to wound bed prior to debridement Wound  #5 Right Lower Leg: Topical Lidocaine 4% cream applied to wound bed prior to debridement Primary Wound Dressing: Wound #4 Left Lower Leg: Aquacel Ag Wound #5 Right Lower Leg: Aquacel Ag Secondary Dressing: Wound #4 Left Lower Leg: ABD pad XtraSorb Wound #5 Right Lower Leg: ABD pad XtraSorb Dressing Change Frequency: Wound #4 Left Lower Leg: Change dressing every week Wound #5 Right Lower Leg: Change dressing every week Follow-up Appointments: Wound #4 Left Lower Leg: Return Appointment in 1 week. Wound #5 Right Lower Leg: Return Appointment in 1 week. Edema Control: Wound #4 Left Lower Leg: 4 Layer Compression System - Bilateral Wound #5 Right Lower Leg: 4 Layer Compression System - Bilateral Additional Orders / Instructions: Wound #4 Left Lower Leg: Increase protein intake. Wound #5 Right  Lower Leg: Increase protein intake. KONOR, NOREN R. ([id number]) We put silver alginate on the wounds predominantly on the left leg. In view of the fact that the patient states that the right leg is draining I elected to wrap that as well both in Profore's. He was advised to remove this should there be pain other than tightness. The patient is previously seen vascular surgery I will see if we can get these records. The patient is to bring his compression stockings next week to see what he is wearing. He may actually need 30-40 mm of compression Electronic Signature(s) Signed: 09/05/2016 4:19:04 PM By: Baltazar Najjar MD Entered By: Baltazar Najjar on 09/05/2016 15:33:38 [name] ([id number]) -------------------------------------------------------------------------------- ROS/PFSH Details [name] Date of Service: 09/05/2016 2:15 PM Patient Name: R. Patient Account Number: [id number] Medical Record Treating RN: Curtis Sites [id number] Number: Other Clinician: Date of Birth/Sex: [birth year] (70 y.o. Male) Treating ROBSON, MICHAEL Primary Care  Physician/Extender: Virgil Benedict Physician: Referring Physician: Denton Brick in Treatment: 0 Information Obtained From Patient Wound History Do you currently have one or more open woundso Yes How many open wounds do you currently haveo 2 Approximately how long have you had your woundso off and on for 2 years How have you been treating your wound(s) until nowo open to air Has your wound(s) ever healed and then re-openedo No Have you had any lab work done in the past montho Yes Who ordered the lab work doneo PCP Have you tested positive for an antibiotic resistant organism (MRSA, VRE)o No Have you tested positive for osteomyelitis (bone infection)o No Have you had any tests for circulation on your legso Yes Who ordered the testo [facility] Where was the test doneo [facility] Have you had other problems associated with your woundso Infection Eyes Complaints and Symptoms: Positive for: Glasses / Contacts - glasses Constitutional Symptoms (General Health) Complaints and Symptoms: No Complaints or Symptoms Ear/Nose/Mouth/Throat Complaints and Symptoms: No Complaints or Symptoms Hematologic/Lymphatic Medical History: Positive for: Anemia Respiratory MANSOUR, BALBOA ([id number]) Medical History: Positive for: Asthma; Chronic Obstructive Pulmonary Disease (COPD) Negative for: Sleep Apnea Past Medical History Notes: pneumonectomy of right lung Cardiovascular Medical History: Positive for: Arrhythmia; Coronary Artery Disease; Hypertension Gastrointestinal Complaints and Symptoms: No Complaints or Symptoms Endocrine Complaints and Symptoms: No Complaints or Symptoms Medical History: Positive for: Type II Diabetes Treated with: Diet Genitourinary Complaints and Symptoms: No Complaints or Symptoms Medical History: Past Medical History Notes: have only one kidney Immunological Complaints and Symptoms: No Complaints or Symptoms Integumentary (Skin) Complaints  and Symptoms: No Complaints or Symptoms Musculoskeletal Medical History: Positive for: Osteoarthritis Neurologic ISIAHA, GREENUP ([id number]) Complaints and Symptoms: No Complaints or Symptoms Oncologic Complaints and Symptoms: No Complaints or Symptoms Medical History: Negative for: Received Chemotherapy; Received Radiation Psychiatric Complaints and Symptoms: No Complaints or Symptoms Immunizations Pneumococcal Vaccine: Received Pneumococcal Vaccination: Yes Immunization Notes: up to date Family and Social History Cancer: No; Diabetes: No; Heart Disease: Yes - Father, Mother; Hereditary Spherocytosis: No; Hypertension: Yes - Father, Mother; Kidney Disease: No; Lung Disease: No; Seizures: No; Stroke: Yes - SELF; Thyroid Problems: Yes - Mother; Tuberculosis: No; Former smoker; Marital Status - Married; Alcohol Use: Never - quit; Drug Use: No History; Caffeine Use: Daily - coffee, coke; Financial Concerns: No; Food, Clothing or Shelter Needs: No; Support System Lacking: No; Transportation Concerns: No; Advanced Directives: No; Patient does not want information on Advanced Directives; Living Will: No Electronic Signature(s) Signed: 09/05/2016 4:19:04 PM By: Baltazar Najjar MD Signed: 09/05/2016  5:07:29 PM By: Curtis Sites Entered By: Curtis Sites on 09/05/2016 14:17:13 [name] ([id number]) -------------------------------------------------------------------------------- Benna Dunks Date of Service: 09/05/2016 Patient Name: R. Patient Account Number: [id number] Medical Record Treating RN: Curtis Sites [id number] Number: Other Clinician: Date of Birth/Sex: 12-23-1946 (70 y.o. 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 Physician Procedures CPT4: Description Modifier Quantity Code 6962952 WC PHYS LEVEL 3 o NEW PT 1 ICD-10 Description Diagnosis I87.333 Chronic venous hypertension (idiopathic) with ulcer and inflammation of bilateral lower extremity Electronic Signature(s) Signed: 09/05/2016 4:37:48 PM By: Curtis Sites Previous Signature: 09/05/2016 4:19:04 PM Version By: Baltazar Najjar MD Entered By: Curtis Sites on 09/05/2016 16:37:47

## 2016-09-05 NOTE — Progress Notes (Signed)
Victor Daniels ([id number]) Visit Report for 09/05/2016 Allergy List Details Victor Daniels, Victor Daniels Date of Service: 09/05/2016 2:15 PM Patient Name: R. Patient Account Number: [id number] Medical Record Treating RN: Victor Daniels [id number] Number: Other Clinician: Date of Birth/Sex: [birth year] (69 y.o. Male) Treating Victor Daniels, [place] Primary Care Physician/Extender: Victor Daniels Physician: Referring Physician: Denton Daniels in Treatment: 0 Allergies Active Allergies No Known Drug Allergies Allergy Notes Electronic Signature(s) Signed: 09/05/2016 5:07:29 PM By: Victor Daniels Entered By: Victor Daniels on 09/05/2016 14:13:47 Victor Daniels ([id number]) -------------------------------------------------------------------------------- Arrival Information Details Victor Daniels Date of Service: 09/05/2016 2:15 PM Patient Name: R. Patient Account Number: [id number] Medical Record Treating RN: Victor Daniels [id number] Number: Other Clinician: Date of Birth/Sex: 11/25/1946 (69 y.o. Male) Treating Victor Daniels Primary Care Physician/Extender: Victor Daniels Physician: Referring Physician: Denton Daniels in Treatment: 0 Visit Information Patient Arrived: Ambulatory Arrival Time: 14:12 Accompanied By: self Transfer Assistance: None Patient Identification Verified: Yes Secondary Verification Process Yes Completed: History Since Last Visit Added or deleted any medications: No Any new allergies or adverse reactions: No Had a fall or experienced change in activities of daily living that may affect risk of falls: No Signs or symptoms of abuse/neglect since last visito No Hospitalized since last visit: No Electronic Signature(s) Signed: 09/05/2016 5:07:29 PM By: Victor Daniels Entered By: Victor Daniels on 09/05/2016 14:13:30 Victor Daniels  ([id number]) -------------------------------------------------------------------------------- Clinic Level of Care Assessment Details Victor Daniels Date of Service: 09/05/2016 2:15 PM Patient Name: R. Patient Account Number: [id number] Medical Record Treating RN: Victor Daniels [id number] Number: Other Clinician: Date of Birth/Sex: 26-Jan-1946 (69 y.o. Male) Treating Victor Daniels, Daniels Primary Care Physician/Extender: Victor Daniels Physician: Referring Physician: Denton Daniels in Treatment: 0 Clinic Level of Care Assessment Items TOOL 1 Quantity Score []  - Use when EandM and Procedure is performed on INITIAL visit 0 ASSESSMENTS - Nursing Assessment / Reassessment X - General Physical Exam (combine w/ comprehensive assessment (listed just 1 20 below) when performed on new pt. evals) X - Comprehensive Assessment (HX, ROS, Risk Assessments, Wounds Hx, etc.) 1 25 ASSESSMENTS - Wound and Skin Assessment / Reassessment []  - Dermatologic / Skin Assessment (not related to wound area) 0 ASSESSMENTS - Ostomy and/or Continence Assessment and Care []  - Incontinence Assessment and Management 0 []  - Ostomy Care Assessment and Management (repouching, etc.) 0 PROCESS - Coordination of Care X - Simple Patient / Family Education for ongoing care 1 15 []  - Complex (extensive) Patient / Family Education for ongoing care 0 X - Staff obtains Chiropractor, Records, Test Results / Process Orders 1 10 []  - Staff telephones HHA, Nursing Homes / Clarify orders / etc 0 []  - Routine Transfer to another Facility (non-emergent condition) 0 []  - Routine Hospital Admission (non-emergent condition) 0 X - New Admissions / Manufacturing engineer / Ordering NPWT, Apligraf, etc. 1 15 []  - Emergency Hospital Admission (emergent condition) 0 PROCESS - Special Needs []  - Pediatric / Minor Patient Management 0 Victor Daniels, Victor Daniels ([id number]) []  - Isolation Patient Management 0 []  - Hearing / Language /  Visual special needs 0 []  - Assessment of Community assistance (transportation, D/C planning, etc.) 0 []  - Additional assistance / Altered mentation 0 []  - Support Surface(s) Assessment (bed, cushion, seat, etc.) 0 INTERVENTIONS - Miscellaneous []  - External ear exam 0 []  - Patient Transfer (multiple staff / Nurse, adult / Similar devices) 0 []  - Simple Staple / Suture removal (25 or less) 0 []  - Complex Staple / Suture removal (26 or  more) 0 []  - Hypo/Hyperglycemic Management (do not check if billed separately) 0 X - Ankle / Brachial Index (ABI) - do not check if billed separately 1 15 Has the patient been seen at the hospital within the last three years: Yes Total Score: 100 Level Of Care: New/Established - Level 3 Electronic Signature(s) Signed: 09/05/2016 5:07:29 PM By: Victor Daniels Entered By: Victor Daniels on 09/05/2016 16:37:32 Victor Daniels ([id number]) -------------------------------------------------------------------------------- Encounter Discharge Information Details Victor Daniels Date of Service: 09/05/2016 2:15 PM Patient Name: R. Patient Account Number: [id number] Medical Record Treating RN: Victor Daniels [id number] Number: Other Clinician: Date of Birth/Sex: [birth year] (69 y.o. Male) Treating Victor Daniels, Daniels Primary Care Physician/Extender: Victor Daniels Physician: Referring Physician: Denton Daniels in Treatment: 0 Encounter Discharge Information Items Discharge Pain Level: 0 Discharge Condition: Stable Ambulatory Status: Ambulatory Discharge Destination: Home Transportation: Private Auto Accompanied By: self Schedule Follow-up Appointment: Yes Medication Reconciliation completed and provided to Patient/Care No Victor Daniels: Provided on Clinical Summary of Care: 09/05/2016 Form Type Recipient Paper Patient EK Electronic Signature(s) Signed: 09/05/2016 4:38:35 PM By: Victor Daniels Previous Signature: 09/05/2016 3:32:06 PM Version By:  Victor Daniels Entered By: Victor Daniels on 09/05/2016 16:38:35 Victor Daniels ([id number]) -------------------------------------------------------------------------------- Lower Extremity Assessment Details Victor Daniels Date of Service: 09/05/2016 2:15 PM Patient Name: R. Patient Account Number: [id number] Medical Record Treating RN: Victor Daniels [id number] Number: Other Clinician: Date of Birth/Sex: 1946/05/30 (69 y.o. Male) Treating Victor Daniels Primary Care Physician/Extender: Victor Daniels Physician: Referring Physician: Denton Daniels in Treatment: 0 Edema Assessment Assessed: [Left: No] [Right: No] Edema: [Left: Yes] [Right: Yes] Calf Left: Right: Point of Measurement: 35 cm From Medial Instep 48.6 cm 48.6 cm Ankle Left: Right: Point of Measurement: 12 cm From Medial Instep 29.5 cm 29 cm Vascular Assessment Pulses: Posterior Tibial Palpable: [Left:No] [Right:No] Doppler: [Left:Multiphasic] [Right:Multiphasic] Dorsalis Pedis Palpable: [Left:Yes] [Right:Yes] Doppler: [Left:Monophasic] [Right:Monophasic] Extremity colors, hair growth, and conditions: Extremity Color: [Left:Hyperpigmented] [Right:Hyperpigmented] Hair Growth on Extremity: [Left:No] [Right:No] Temperature of Extremity: [Left:Warm] [Right:Warm] Capillary Refill: [Left:< 3 seconds] [Right:< 3 seconds] Blood Pressure: Brachial: [Left:116] Dorsalis Pedis: 182 [Left:Dorsalis Pedis: 166] Ankle: Posterior Tibial: 168 [Left:Posterior Tibial: 178 1.57] [Right:1.53] Toe Nail Assessment Left: Right: Thick: Yes Yes DVONTAE, RUAN ([id number]) Discolored: Yes Yes Deformed: No No Improper Length and Hygiene: No No Electronic Signature(s) Signed: 09/05/2016 5:07:29 PM By: Victor Daniels Entered By: Victor Daniels on 09/05/2016 14:38:43 Victor Daniels ([id number]) -------------------------------------------------------------------------------- Multi Wound Chart  Details Victor Daniels Date of Service: 09/05/2016 2:15 PM Patient Name: R. Patient Account Number: [id number] Medical Record Treating RN: Victor Daniels [id number] Number: Other Clinician: Date of Birth/Sex: Dec 06, 1946 (69 y.o. Male) Treating Victor Daniels Primary Care Physician/Extender: Victor Daniels Physician: Referring Physician: Denton Daniels in Treatment: 0 Vital Signs Height(in): 72 Pulse(bpm): 73 Weight(lbs): 303 Blood Pressure 116/70 (mmHg): Body Mass Index(BMI): 41 Temperature(F): 98.2 Respiratory Rate 18 (breaths/min): Photos: [N/A:N/A] Wound Location: Left Lower Leg Right Lower Leg N/A Wounding Event: Blister Blister N/A Primary Etiology: Venous Leg Ulcer Venous Leg Ulcer N/A Comorbid History: Anemia, Asthma, Chronic Anemia, Asthma, Chronic N/A Obstructive Pulmonary Obstructive Pulmonary Disease (COPD), Disease (COPD), Arrhythmia, Coronary Arrhythmia, Coronary Artery Disease, Artery Disease, Hypertension, Type II Hypertension, Type II Diabetes, Osteoarthritis Diabetes, Osteoarthritis Date Acquired: 07/31/2016 07/31/2016 N/A Weeks of Treatment: 0 0 N/A Wound Status: Open Open N/A Clustered Wound: Yes Yes N/A Measurements L x W x D 5x2.3x0.1 2x4x0.1 N/A (cm) Area (cm) : 9.032 6.283 N/A Volume (cm) : 0.903 0.628 N/A % Reduction in Area:  0.00% 0.00% N/A % Reduction in Volume: 0.00% 0.00% N/A Victor Daniels, Victor Daniels ([id number]) Classification: Partial Thickness Partial Thickness N/A HBO Classification: Grade 1 Grade 1 N/A Exudate Amount: Large Medium N/A Exudate Type: Serous Serous N/A Exudate Color: amber amber N/A Wound Margin: Flat and Intact Flat and Intact N/A Granulation Amount: Large (67-100%) Large (67-100%) N/A Granulation Quality: Daniels Daniels N/A Necrotic Amount: None Present (0%) None Present (0%) N/A Exposed Structures: Fascia: No Fascia: No N/A Fat: No Fat: No Tendon: No Tendon: No Muscle: No Muscle: No Joint:  No Joint: No Bone: No Bone: No Limited to Skin Limited to Skin Breakdown Breakdown Epithelialization: Small (1-33%) Small (1-33%) N/A Periwound Skin Texture: Edema: No Edema: No N/A Excoriation: No Excoriation: No Induration: No Induration: No Callus: No Callus: No Crepitus: No Crepitus: No Fluctuance: No Fluctuance: No Friable: No Friable: No Rash: No Rash: No Scarring: No Scarring: No Periwound Skin Moist: Yes Moist: Yes N/A Moisture: Maceration: No Maceration: No Dry/Scaly: No Dry/Scaly: No Periwound Skin Color: Atrophie Blanche: No Atrophie Blanche: No N/A Cyanosis: No Cyanosis: No Ecchymosis: No Ecchymosis: No Erythema: No Erythema: No Hemosiderin Staining: No Hemosiderin Staining: No Mottled: No Mottled: No Pallor: No Pallor: No Rubor: No Rubor: No Temperature: No Abnormality No Abnormality N/A Tenderness on No No N/A Palpation: Wound Preparation: Ulcer Cleansing: Ulcer Cleansing: N/A Rinsed/Irrigated with Rinsed/Irrigated with Saline Saline Topical Anesthetic Topical Anesthetic Applied: None Applied: None Treatment Notes Victor Daniels, Victor Daniels ([id number]) Electronic Signature(s) Signed: 09/05/2016 5:07:29 PM By: Victor Daniels Entered By: Victor Daniels on 09/05/2016 14:56:54 Victor Daniels ([id number]) -------------------------------------------------------------------------------- Multi-Disciplinary Care Plan Details Victor Daniels Date of Service: 09/05/2016 2:15 PM Patient Name: R. Patient Account Number: [id number] Medical Record Treating RN: Victor Daniels [id number] Number: Other Clinician: Date of Birth/Sex: 1946-02-06 (69 y.o. Male) Treating Victor Daniels, Daniels Primary Care Physician/Extender: Victor Daniels Physician: Referring Physician: Denton Daniels in Treatment: 0 Active Inactive Abuse / Safety / Falls / Self Care Management Nursing Diagnoses: Impaired physical mobility Potential for  falls Goals: Patient will remain injury free Date Initiated: 09/05/2016 Goal Status: Active Interventions: Assess fall risk on admission and as needed Notes: Orientation to the Wound Care Program Nursing Diagnoses: Knowledge deficit related to the wound healing center program Goals: Patient/caregiver will verbalize understanding of the Wound Healing Center Program Date Initiated: 09/05/2016 Goal Status: Active Interventions: Provide education on orientation to the wound center Notes: Venous Leg Ulcer Nursing Diagnoses: Actual venous Insuffiency (use after diagnosis is confirmed) Victor Daniels, Victor Daniels ([id number]) Goals: Patient will maintain optimal edema control Date Initiated: 09/05/2016 Goal Status: Active Interventions: Assess peripheral edema status every visit. Notes: Wound/Skin Impairment Nursing Diagnoses: Impaired tissue integrity Goals: Patient/caregiver will verbalize understanding of skin care regimen Date Initiated: 09/05/2016 Goal Status: Active Ulcer/skin breakdown will have a volume reduction of 30% by week 4 Date Initiated: 09/05/2016 Goal Status: Active Ulcer/skin breakdown will have a volume reduction of 50% by week 8 Date Initiated: 09/05/2016 Goal Status: Active Ulcer/skin breakdown will have a volume reduction of 80% by week 12 Date Initiated: 09/05/2016 Goal Status: Active Ulcer/skin breakdown will heal within 14 weeks Date Initiated: 09/05/2016 Goal Status: Active Interventions: Assess patient/caregiver ability to obtain necessary supplies Assess patient/caregiver ability to perform ulcer/skin care regimen upon admission and as needed Assess ulceration(s) every visit Notes: Electronic Signature(s) Signed: 09/05/2016 5:07:29 PM By: Victor Daniels Entered By: Victor Daniels on 09/05/2016 14:56:35 Victor Daniels ([id number]) -------------------------------------------------------------------------------- Pain Assessment  Details Victor Daniels Date of Service: 09/05/2016 2:15 PM Patient Name: R. Patient  Account Number: [id number] Medical Record Treating RN: Victor Daniels [id number] Number: Other Clinician: Date of Birth/Sex: Apr 30, 1946 (69 y.o. Male) Treating Victor Daniels, [place] Primary Care Physician/Extender: Victor Daniels Physician: Referring Physician: Denton Daniels in Treatment: 0 Active Problems Location of Pain Severity and Description of Pain Patient Has Paino No Site Locations Pain Management and Medication Current Pain Management: Notes Topical or injectable lidocaine is offered to patient for acute pain when surgical debridement is performed. If needed, Patient is instructed to use over the counter pain medication for the following 24-48 hours after debridement. Wound care MDs do not prescribed pain medications. Patient has chronic pain or uncontrolled pain. Patient has been instructed to make an appointment with their Primary Care Physician for pain management. Electronic Signature(s) Signed: 09/05/2016 5:07:29 PM By: Victor Daniels Entered By: Victor Daniels on 09/05/2016 14:13:39 Victor Daniels ([id number]) -------------------------------------------------------------------------------- Patient/Caregiver Education Details Victor Daniels Date of Service: 09/05/2016 2:15 PM Patient Name: R. Patient Account Number: [id number] Medical Record Treating RN: Victor Daniels [id number] Number: Other Clinician: Date of Birth/Gender: January 26, 1946 (69 y.o. Male) Treating Victor Daniels, Daniels Primary Care Physician/Extender: Victor Daniels Physician: Tania Ade in Treatment: 0 Referring Physician: Duncan Dull Education Assessment Education Provided To: Patient Education Topics Provided Venous: Handouts: Controlling Swelling with Multilayered Compression Wraps Methods: Demonstration, Explain/Verbal, Printed Responses: State content correctly Electronic  Signature(s) Signed: 09/05/2016 5:07:29 PM By: Victor Daniels Entered By: Victor Daniels on 09/05/2016 16:38:54 Victor Daniels ([id number]) -------------------------------------------------------------------------------- Wound Assessment Details Victor Daniels Date of Service: 09/05/2016 2:15 PM Patient Name: R. Patient Account Number: [id number] Medical Record Treating RN: Victor Daniels [id number] Number: Other Clinician: Date of Birth/Sex: [birth year] (69 y.o. Male) Treating Victor Daniels Primary Care Physician/Extender: Victor Daniels Physician: Referring Physician: Denton Daniels in Treatment: 0 Wound Status Wound Number: 4 Primary Venous Leg Ulcer Etiology: Wound Location: Left Lower Leg Wound Open Wounding Event: Blister Status: Date Acquired: 07/31/2016 Comorbid Anemia, Asthma, Chronic Obstructive Weeks Of Treatment: 0 History: Pulmonary Disease (COPD), Clustered Wound: Yes Arrhythmia, Coronary Artery Disease, Hypertension, Type II Diabetes, Osteoarthritis Photos Wound Measurements Length: (cm) 5 Width: (cm) 2.3 Depth: (cm) 0.1 Area: (cm) 9.032 Volume: (cm) 0.903 % Reduction in Area: 0% % Reduction in Volume: 0% Epithelialization: Small (1-33%) Tunneling: No Undermining: No Wound Description Classification: Partial Thickness Foul Odor Aft Diabetic Severity (Wagner): Grade 1 Wound Margin: Flat and Intact Exudate Amount: Large Exudate Type: Serous Exudate Color: amber Victor Daniels, Victor Daniels ([id number]) er Cleansing: No Wound Bed Granulation Amount: Large (67-100%) Exposed Structure Granulation Quality: Daniels Fascia Exposed: No Necrotic Amount: None Present (0%) Fat Layer Exposed: No Tendon Exposed: No Muscle Exposed: No Joint Exposed: No Bone Exposed: No Limited to Skin Breakdown Periwound Skin Texture Texture Color No Abnormalities Noted: No No Abnormalities Noted: No Callus: No Atrophie Blanche: No Crepitus:  No Cyanosis: No Excoriation: No Ecchymosis: No Fluctuance: No Erythema: No Friable: No Hemosiderin Staining: No Induration: No Mottled: No Localized Edema: No Pallor: No Rash: No Rubor: No Scarring: No Temperature / Pain Moisture Temperature: No Abnormality No Abnormalities Noted: No Dry / Scaly: No Maceration: No Moist: Yes Wound Preparation Ulcer Cleansing: Rinsed/Irrigated with Saline Topical Anesthetic Applied: None Treatment Notes Wound #4 (Left Lower Leg) 1. Cleansed with: Clean wound with Normal Saline 4. Dressing Applied: Aquacel Ag 5. Secondary Dressing Applied ABD Pad 7. Secured with 4 Layer Compression System - Bilateral Notes xtrasorb Electronic Signature(s) Victor Daniels, Victor Daniels ([id number]) Signed: 09/05/2016 5:07:29 PM By: Victor Daniels Entered By: Victor Daniels on 09/05/2016 14:55:47 Victor Daniels R. (  [id number]) -------------------------------------------------------------------------------- Wound Assessment Details Victor PinkKIRCHGESSNER, Victor Daniels Date of Service: 09/05/2016 2:15 PM Patient Name: R. Patient Account Number: [id number] Medical Record Treating RN: Victor SitesDorthy, Joanna [id number] Number: Other Clinician: Date of Birth/Sex: 28-Jun-1946 (69 y.o. Male) Treating Victor HawkingOBSON, [place] Primary Care Physician/Extender: Victor BenedictG Tullo, Teresa Physician: Referring Physician: Denton Brickullo, Teresa Weeks in Treatment: 0 Wound Status Wound Number: 5 Primary Venous Leg Ulcer Etiology: Wound Location: Right Lower Leg Wound Open Wounding Event: Blister Status: Date Acquired: 07/31/2016 Comorbid Anemia, Asthma, Chronic Obstructive Weeks Of Treatment: 0 History: Pulmonary Disease (COPD), Clustered Wound: Yes Arrhythmia, Coronary Artery Disease, Hypertension, Type II Diabetes, Osteoarthritis Photos Wound Measurements Length: (cm) 2 Width: (cm) 4 Depth: (cm) 0.1 Area: (cm) 6.283 Volume: (cm) 0.628 % Reduction in Area: 0% % Reduction in Volume:  0% Epithelialization: Small (1-33%) Tunneling: No Undermining: No Wound Description Classification: Partial Thickness Foul Odor Aft Diabetic Severity (Wagner): Grade 1 Wound Margin: Flat and Intact Exudate Amount: Medium Exudate Type: Serous Exudate Color: amber Victor Daniels, Victor R. ([id number]) er Cleansing: No Wound Bed Granulation Amount: Large (67-100%) Exposed Structure Granulation Quality: Daniels Fascia Exposed: No Necrotic Amount: None Present (0%) Fat Layer Exposed: No Tendon Exposed: No Muscle Exposed: No Joint Exposed: No Bone Exposed: No Limited to Skin Breakdown Periwound Skin Texture Texture Color No Abnormalities Noted: No No Abnormalities Noted: No Callus: No Atrophie Blanche: No Crepitus: No Cyanosis: No Excoriation: No Ecchymosis: No Fluctuance: No Erythema: No Friable: No Hemosiderin Staining: No Induration: No Mottled: No Localized Edema: No Pallor: No Rash: No Rubor: No Scarring: No Temperature / Pain Moisture Temperature: No Abnormality No Abnormalities Noted: No Dry / Scaly: No Maceration: No Moist: Yes Wound Preparation Ulcer Cleansing: Rinsed/Irrigated with Saline Topical Anesthetic Applied: None Treatment Notes Wound #5 (Right Lower Leg) 1. Cleansed with: Clean wound with Normal Saline 4. Dressing Applied: Aquacel Ag 5. Secondary Dressing Applied ABD Pad 7. Secured with 4 Layer Compression System - Bilateral Notes xtrasorb Electronic Signature(s) Victor Daniels, Paxon R. ([id number]) Signed: 09/05/2016 5:07:29 PM By: Victor Sitesorthy, Joanna Entered By: Victor Sitesorthy, Joanna on 09/05/2016 14:56:11 Victor Daniels, Kaylon R. ([id number]) -------------------------------------------------------------------------------- Vitals Details Victor PinkKIRCHGESSNER, [place] Date of Service: 09/05/2016 2:15 PM Patient Name: R. Patient Account Number: [id number] Medical Record Treating RN: Victor SitesDorthy, Joanna [id number] Number: Other Clinician: Date of Birth/Sex:  28-Jun-1946 (69 y.o. Male) Treating Victor Daniels Primary Care Physician/Extender: Victor BenedictG Tullo, Teresa Physician: Referring Physician: Denton Brickullo, Teresa Weeks in Treatment: 0 Vital Signs Time Taken: 14:19 Temperature (F): 98.2 Height (in): 72 Pulse (bpm): 73 Source: Stated Respiratory Rate (breaths/min): 18 Weight (lbs): 303 Blood Pressure (mmHg): 116/70 Source: Stated Reference Range: 80 - 120 mg / dl Body Mass Index (BMI): 41.1 Electronic Signature(s) Signed: 09/05/2016 5:07:29 PM By: Victor Sitesorthy, Joanna Entered By: Victor Sitesorthy, Joanna on 09/05/2016 14:20:42

## 2016-09-07 ENCOUNTER — Telehealth: Payer: Self-pay

## 2016-09-07 NOTE — Telephone Encounter (Signed)
Pt was last seen DS on 05/19/16, per last OV note pt was to Begin nebulized budesonide and arformoterol in hopes that it would be covered by Medicare. Medicare did deny the Brovana so we sent it to [facility] and patient received this medication. Spoke with pt who states he wants to clarify that he is to be taking both Brovana and Pulmicort and what medications does this replace. Pt is currently taking symbicort daily and albuterol prn. Pt states that if this is a 90d supply he will be able to afford these medications.  DS please advise.

## 2016-09-10 NOTE — Telephone Encounter (Signed)
Yes.Marland Kitchen.Marland Kitchen.Take BOTH Pulmicort (Budesonide) and Brovana in the nebulizer twice a day. These medications are to replace Symbicort...STOP SYMBICORT  Thanks  Theodoro Gristave

## 2016-09-11 NOTE — Telephone Encounter (Signed)
Spoke with patient and made him aware of DS recommendations. Pt states he will call his pharmacy and [facility] to compare prices and will give us a call back to let us know where to send these rx to. Will await call back.

## 2016-09-11 NOTE — Telephone Encounter (Signed)
LMOVM Will await call back.  

## 2016-09-12 ENCOUNTER — Encounter: Payer: Commercial Managed Care - HMO | Admitting: Internal Medicine

## 2016-09-12 ENCOUNTER — Telehealth: Payer: Self-pay | Admitting: Pulmonary Disease

## 2016-09-12 DIAGNOSIS — J449 Chronic obstructive pulmonary disease, unspecified: Secondary | ICD-10-CM | POA: Diagnosis not present

## 2016-09-12 DIAGNOSIS — L97221 Non-pressure chronic ulcer of left calf limited to breakdown of skin: Secondary | ICD-10-CM | POA: Diagnosis not present

## 2016-09-12 DIAGNOSIS — Z87891 Personal history of nicotine dependence: Secondary | ICD-10-CM | POA: Diagnosis not present

## 2016-09-12 DIAGNOSIS — I87333 Chronic venous hypertension (idiopathic) with ulcer and inflammation of bilateral lower extremity: Secondary | ICD-10-CM | POA: Diagnosis not present

## 2016-09-12 DIAGNOSIS — L97211 Non-pressure chronic ulcer of right calf limited to breakdown of skin: Secondary | ICD-10-CM | POA: Diagnosis not present

## 2016-09-12 DIAGNOSIS — I87313 Chronic venous hypertension (idiopathic) with ulcer of bilateral lower extremity: Secondary | ICD-10-CM | POA: Diagnosis not present

## 2016-09-12 DIAGNOSIS — I10 Essential (primary) hypertension: Secondary | ICD-10-CM | POA: Diagnosis not present

## 2016-09-12 DIAGNOSIS — E119 Type 2 diabetes mellitus without complications: Secondary | ICD-10-CM | POA: Diagnosis not present

## 2016-09-12 DIAGNOSIS — D649 Anemia, unspecified: Secondary | ICD-10-CM | POA: Diagnosis not present

## 2016-09-12 DIAGNOSIS — I2510 Atherosclerotic heart disease of native coronary artery without angina pectoris: Secondary | ICD-10-CM | POA: Diagnosis not present

## 2016-09-12 DIAGNOSIS — L97821 Non-pressure chronic ulcer of other part of left lower leg limited to breakdown of skin: Secondary | ICD-10-CM | POA: Diagnosis not present

## 2016-09-12 DIAGNOSIS — L97811 Non-pressure chronic ulcer of other part of right lower leg limited to breakdown of skin: Secondary | ICD-10-CM | POA: Diagnosis not present

## 2016-09-12 NOTE — Telephone Encounter (Signed)
Pt came by office and spoke with Victor Daniels and request that she find a DME that will accept his insurance and give him a quote on pulmicort and brovana. Pt states he will give us a call back once he has heard from [facility]. Will await call back.   After speaking to Victor LoserRhonda, she has already created another telephone message. I will close this encounter.

## 2016-09-12 NOTE — Telephone Encounter (Signed)
Pt walked into clinic today requesting that he has been having a difficult time trying to obtain nebulizer medication. Pt is requesting that a Rx for arformoterol 15 MCG/402ml and budesonide 0.25 MG/2ML nebulizer solution be faxed to [facility].   Pt has been scheduled a follow up appointment for 10/09/16.  Please advise.  Thank you. [name] J Cobb

## 2016-09-12 NOTE — Progress Notes (Signed)
[name], Daxson R. ([id number]) Visit Report for 09/12/2016 Arrival Information Details Odetta PinkKIRCHGESSNER, Puneet Date of Service: 09/12/2016 10:15 AM Patient Name: R. Patient Account Number: [id number] Medical Record Treating RN: Huel CoventryWoody, Kim [id number] Number: Other Clinician: Date of Birth/Sex: 1946-01-20 (70 y.o. Male) Treating Baltazar NajjarOBSON, MICHAEL Primary Care Physician/Extender: Virgil BenedictG Tullo, Teresa Physician: Referring Physician: Denton Brickullo, Teresa Weeks in Treatment: 1 Visit Information History Since Last Visit Added or deleted any medications: Yes Patient Arrived: Ambulatory Any new allergies or adverse reactions: No Arrival Time: 10:39 Had a fall or experienced change in No Accompanied By: SELF activities of daily living that may affect Transfer Assistance: None risk of falls: Patient Identification Verified: Yes Signs or symptoms of abuse/neglect since last No Secondary Verification Process Yes visito Completed: Hospitalized since last visit: No Has Dressing in Place as Prescribed: No Has Compression in Place as Prescribed: No Pain Present Now: No Electronic Signature(s) Signed: 09/12/2016 4:47:59 PM By: Elliot GurneyWoody, RN, BSN, Kim RN, BSN Entered By: Elliot GurneyWoody, RN, BSN, Kim on 09/12/2016 10:40:15 [name], Chino R. ([id number]) -------------------------------------------------------------------------------- Clinic Level of Care Assessment Details Odetta PinkKIRCHGESSNER, Mckinnley Date of Service: 09/12/2016 10:15 AM Patient Name: R. Patient Account Number: [id number] Medical Record Treating RN: Huel CoventryWoody, Kim [id number] Number: Other Clinician: Date of Birth/Sex: 1946-01-20 (70 y.o. Male) Treating ROBSON, MICHAEL Primary Care Physician/Extender: Virgil BenedictG Tullo, Teresa Physician: Referring Physician: Denton Brickullo, Teresa Weeks in Treatment: 1 Clinic Level of Care Assessment Items TOOL 4 Quantity Score []  - Use when only an EandM is performed on FOLLOW-UP visit 0 ASSESSMENTS - Nursing Assessment / Reassessment []   - Reassessment of Co-morbidities (includes updates in patient status) 0 X - Reassessment of Adherence to Treatment Plan 1 5 ASSESSMENTS - Wound and Skin Assessment / Reassessment X - Simple Wound Assessment / Reassessment - one wound 1 5 []  - Complex Wound Assessment / Reassessment - multiple wounds 0 []  - Dermatologic / Skin Assessment (not related to wound area) 0 ASSESSMENTS - Focused Assessment []  - Circumferential Edema Measurements - multi extremities 0 []  - Nutritional Assessment / Counseling / Intervention 0 []  - Lower Extremity Assessment (monofilament, tuning fork, pulses) 0 []  - Peripheral Arterial Disease Assessment (using hand held doppler) 0 ASSESSMENTS - Ostomy and/or Continence Assessment and Care []  - Incontinence Assessment and Management 0 []  - Ostomy Care Assessment and Management (repouching, etc.) 0 PROCESS - Coordination of Care X - Simple Patient / Family Education for ongoing care 1 15 []  - Complex (extensive) Patient / Family Education for ongoing care 0 []  - Staff obtains Consents, Records, Test Results / Process Orders 0 [name], Billyjack R. ([id number]) []  - Staff telephones HHA, Nursing Homes / Clarify orders / etc 0 []  - Routine Transfer to another Facility (non-emergent condition) 0 []  - Routine Hospital Admission (non-emergent condition) 0 []  - New Admissions / [employer] Authorizations / Ordering NPWT, Apligraf, etc. 0 []  - Emergency Hospital Admission (emergent condition) 0 X - Simple Discharge Coordination 1 10 []  - Complex (extensive) Discharge Coordination 0 PROCESS - Special Needs []  - Pediatric / Minor Patient Management 0 []  - Isolation Patient Management 0 []  - Hearing / Language / Visual special needs 0 []  - Assessment of Community assistance (transportation, D/C planning, etc.) 0 []  - Additional assistance / Altered mentation 0 []  - Support Surface(s) Assessment (bed, cushion, seat, etc.) 0 INTERVENTIONS - Wound Cleansing / Measurement []   - Simple Wound Cleansing - one wound 0 []  - Complex Wound Cleansing - multiple wounds 0 []  - Wound Imaging (photographs - any number of wounds) 0 []  -  Wound Tracing (instead of photographs) 0 []  - Simple Wound Measurement - one wound 0 []  - Complex Wound Measurement - multiple wounds 0 INTERVENTIONS - Wound Dressings []  - Small Wound Dressing one or multiple wounds 0 []  - Medium Wound Dressing one or multiple wounds 0 []  - Large Wound Dressing one or multiple wounds 0 []  - Application of Medications - topical 0 []  - Application of Medications - injection 0 TAIQUAN, CAMPANARO ([id number]) INTERVENTIONS - Miscellaneous []  - External ear exam 0 []  - Specimen Collection (cultures, biopsies, blood, body fluids, etc.) 0 []  - Specimen(s) / Culture(s) sent or taken to Lab for analysis 0 []  - Patient Transfer (multiple staff / Michiel Sites Lift / Similar devices) 0 []  - Simple Staple / Suture removal (25 or less) 0 []  - Complex Staple / Suture removal (26 or more) 0 []  - Hypo / Hyperglycemic Management (close monitor of Blood Glucose) 0 []  - Ankle / Brachial Index (ABI) - do not check if billed separately 0 X - Vital Signs 1 5 Has the patient been seen at the hospital within the last three years: Yes Total Score: 40 Level Of Care: New/Established - Level 2 Electronic Signature(s) Signed: 09/12/2016 4:47:59 PM By: Elliot Gurney, RN, BSN, Kim RN, BSN Entered By: Elliot Gurney, RN, BSN, Kim on 09/12/2016 10:58:45 [name] ([id number]) -------------------------------------------------------------------------------- Encounter Discharge Information Details [name] Date of Service: 09/12/2016 10:15 AM Patient Name: R. Patient Account Number: [id number] Medical Record Treating RN: Huel Coventry [id number] Number: Other Clinician: Date of Birth/Sex: 1946-11-04 (70 y.o. Male) Treating Leanord Hawking, MICHAEL Primary Care Physician/Extender: Virgil Benedict Physician: Referring Physician: Denton Brick in Treatment: 1 Encounter Discharge Information Items Discharge Pain Level: 0 Discharge Condition: Stable Ambulatory Status: Ambulatory Discharge Destination: Home Transportation: Private Auto Accompanied By: self Schedule Follow-up Appointment: No Medication Reconciliation completed and provided to Patient/Care Yes Tyliek Timberman: Provided on Clinical Summary of Care: 09/12/2016 Form Type Recipient Paper Patient EK Electronic Signature(s) Signed: 09/12/2016 11:01:49 AM By: Gwenlyn Perking Entered By: Gwenlyn Perking on 09/12/2016 11:01:47 [name] ([id number]) -------------------------------------------------------------------------------- Lower Extremity Assessment Details [name] Date of Service: 09/12/2016 10:15 AM Patient Name: R. Patient Account Number: [id number] Medical Record Treating RN: Huel Coventry [id number] Number: Other Clinician: Date of Birth/Sex: 05/17/1946 (70 y.o. Male) Treating Leanord Hawking, MICHAEL Primary Care Physician/Extender: Virgil Benedict Physician: Referring Physician: Denton Brick in Treatment: 1 Edema Assessment Assessed: [Left: No] [Right: No] E[Left: dema] [Right: :] Calf Left: Right: Point of Measurement: 35 cm From Medial Instep 47.5 cm 47.5 cm Ankle Left: Right: Point of Measurement: 12 cm From Medial Instep 28.5 cm 28.8 cm Vascular Assessment Claudication: Claudication Assessment [Left:None] [Right:None] Pulses: Posterior Tibial Dorsalis Pedis Palpable: [Left:Yes] [Right:Yes] Extremity colors, hair growth, and conditions: Extremity Color: [Left:Hyperpigmented] [Right:Hyperpigmented] Hair Growth on Extremity: [Left:No] [Right:No] Temperature of Extremity: [Left:Cool] [Right:Cool] Capillary Refill: [Left:< 3 seconds] [Right:< 3 seconds] Dependent Rubor: [Left:No] [Right:No] Blanched when Elevated: [Left:No] [Right:No] Lipodermatosclerosis: [Left:Yes] [Right:Yes] Toe Nail Assessment Left:  Right: Thick: Yes Yes Discolored: Yes Yes Deformed: No No KRISHAWN, VANDERWEELE ([id number]) Improper Length and Hygiene: No No Electronic Signature(s) Signed: 09/12/2016 4:47:59 PM By: Elliot Gurney, RN, BSN, Kim RN, BSN Entered By: Elliot Gurney, RN, BSN, Kim on 09/12/2016 10:57:05 [name] ([id number]) -------------------------------------------------------------------------------- Multi Wound Chart Details [name] Date of Service: 09/12/2016 10:15 AM Patient Name: R. Patient Account Number: [id number] Medical Record Treating RN: Huel Coventry [id number] Number: Other Clinician: Date of Birth/Sex: April 04, 1946 (70 y.o. Male) Treating Baltazar Najjar Primary Care Physician/Extender:  Virgil Benedict Physician: Referring Physician: Denton Brick in Treatment: 1 Vital Signs Height(in): 72 Pulse(bpm): 76 Weight(lbs): 303 Blood Pressure 124/69 (mmHg): Body Mass Index(BMI): 41 Temperature(F): 98.2 Respiratory Rate 16 (breaths/min): Photos: [4:No Photos] [5:No Photos] [N/A:N/A] Wound Location: [4:Left Lower Leg] [5:Right Lower Leg] [N/A:N/A] Wounding Event: [4:Blister] [5:Blister] [N/A:N/A] Primary Etiology: [4:Venous Leg Ulcer] [5:Venous Leg Ulcer] [N/A:N/A] Date Acquired: [4:07/31/2016] [5:07/31/2016] [N/A:N/A] Weeks of Treatment: [4:1] [5:1] [N/A:N/A] Wound Status: [4:Open] [5:Open] [N/A:N/A] Clustered Wound: [4:Yes] [5:Yes] [N/A:N/A] Measurements L x W x D 0x0x0 [5:0x0x0] [N/A:N/A] (cm) Area (cm) : [4:0] [5:0] [N/A:N/A] Volume (cm) : [4:0] [5:0] [N/A:N/A] % Reduction in Area: [4:100.00%] [5:100.00%] [N/A:N/A] % Reduction in Volume: 100.00% [5:100.00%] [N/A:N/A] Classification: [4:Partial Thickness] [5:Partial Thickness] [N/A:N/A] Periwound Skin Texture: No Abnormalities Noted [5:No Abnormalities Noted] [N/A:N/A] Periwound Skin [4:No Abnormalities Noted] [5:No Abnormalities Noted] [N/A:N/A] Moisture: Periwound Skin Color: No Abnormalities Noted [5:No  Abnormalities Noted] [N/A:N/A] Tenderness on [4:No] [5:No] [N/A:N/A] Treatment Notes Electronic Signature(s) Signed: 09/12/2016 4:47:59 PM By: Elliot Gurney, RN, BSN, Kim RN, BSN [address], [place] ([id number]) Entered By: Elliot Gurney, RN, BSN, Kim on 09/12/2016 10:51:10 [name] ([id number]) -------------------------------------------------------------------------------- Multi-Disciplinary Care Plan Details [name] Date of Service: 09/12/2016 10:15 AM Patient Name: R. Patient Account Number: [id number] Medical Record Treating RN: Huel Coventry [id number] Number: Other Clinician: Date of Birth/Sex: 1946-06-05 (70 y.o. Male) Treating Leanord Hawking, MICHAEL Primary Care Physician/Extender: Virgil Benedict Physician: Referring Physician: Denton Brick in Treatment: 1 Active Inactive Electronic Signature(s) Signed: 09/12/2016 4:47:59 PM By: Elliot Gurney, RN, BSN, Kim RN, BSN Entered By: Elliot Gurney, RN, BSN, Kim on 09/12/2016 15:34:07 [name] ([id number]) -------------------------------------------------------------------------------- Pain Assessment Details [name] Date of Service: 09/12/2016 10:15 AM Patient Name: R. Patient Account Number: [id number] Medical Record Treating RN: Huel Coventry [id number] Number: Other Clinician: Date of Birth/Sex: Jun 25, 1946 (70 y.o. Male) Treating Baltazar Najjar Primary Care Physician/Extender: Virgil Benedict Physician: Referring Physician: Denton Brick in Treatment: 1 Active Problems Location of Pain Severity and Description of Pain Patient Has Paino No Site Locations With Dressing Change: No Pain Management and Medication Current Pain Management: Notes Topical or injectable lidocaine is offered to patient for acute pain when surgical debridement is performed. If needed, Patient is instructed to use over the counter pain medication for the following 24-48 hours after debridement. Wound care MDs do not  prescribed pain medications. Patient has chronic pain or uncontrolled pain. Patient has been instructed to make an appointment with their Primary Care Physician for pain management. Electronic Signature(s) Signed: 09/12/2016 4:47:59 PM By: Elliot Gurney, RN, BSN, Kim RN, BSN Entered By: Elliot Gurney, RN, BSN, Kim on 09/12/2016 10:40:30 [name] ([id number]) -------------------------------------------------------------------------------- Patient/Caregiver Education Details [name] Date of Service: 09/12/2016 10:15 AM Patient Name: R. Patient Account Number: [id number] Medical Record Treating RN: Huel Coventry [id number] Number: Other Clinician: Date of Birth/Gender: Jun 16, 1946 (70 y.o. Male) Treating Baltazar Najjar Primary Care Physician/Extender: Virgil Benedict Physician: Tania Ade in Treatment: 1 Referring Physician: Duncan Dull Education Assessment Education Provided To: Patient Education Topics Provided Venous: Handouts: Controlling Swelling with Compression Stockings Methods: Demonstration Responses: State content correctly Electronic Signature(s) Signed: 09/12/2016 4:47:59 PM By: Elliot Gurney, RN, BSN, Kim RN, BSN Entered By: Elliot Gurney, RN, BSN, Kim on 09/12/2016 10:59:59 [name] ([id number]) -------------------------------------------------------------------------------- Wound Assessment Details [name] Date of Service: 09/12/2016 10:15 AM Patient Name: R. Patient Account Number: [id number] Medical Record Treating RN: Huel Coventry [id number] Number: Other Clinician: Date of Birth/Sex: 05-13-1946 (70 y.o. Male) Treating Leanord Hawking, MICHAEL Primary Care Physician/Extender: Virgil Benedict Physician:  Referring Physician: Denton Brick in Treatment: 1 Wound Status Wound Number: 4 Primary Etiology: Venous Leg Ulcer Wound Location: Left Lower Leg Wound Status: Open Wounding Event: Blister Date Acquired: 07/31/2016 Weeks Of Treatment:  1 Clustered Wound: Yes Photos Photo Uploaded By: Elliot Gurney, RN, BSN, Kim on 09/12/2016 16:45:57 Wound Measurements Length: (cm) 0 Width: (cm) 0 Depth: (cm) 0 Area: (cm) 0 Volume: (cm) 0 % Reduction in Area: 100% % Reduction in Volume: 100% Wound Description Classification: Partial Thickness Periwound Skin Texture Texture Color No Abnormalities Noted: No No Abnormalities Noted: No Moisture No Abnormalities Noted: No Electronic Signature(s) Signed: 09/12/2016 4:47:59 PM By: Elliot Gurney, RN, BSN, Kim RN, BSN [address], [place] ([id number]) Entered By: Elliot Gurney, RN, BSN, Kim on 09/12/2016 10:45:27 [name] ([id number]) -------------------------------------------------------------------------------- Wound Assessment Details [name] Date of Service: 09/12/2016 10:15 AM Patient Name: R. Patient Account Number: [id number] Medical Record Treating RN: Huel Coventry [id number] Number: Other Clinician: Date of Birth/Sex: 03-Jan-1946 (70 y.o. Male) Treating Baltazar Najjar Primary Care Physician/Extender: Virgil Benedict Physician: Referring Physician: Denton Brick in Treatment: 1 Wound Status Wound Number: 5 Primary Etiology: Venous Leg Ulcer Wound Location: Right Lower Leg Wound Status: Open Wounding Event: Blister Date Acquired: 07/31/2016 Weeks Of Treatment: 1 Clustered Wound: Yes Photos Photo Uploaded By: Elliot Gurney, RN, BSN, Kim on 09/12/2016 16:45:58 Wound Measurements Length: (cm) 0 Width: (cm) 0 Depth: (cm) 0 Area: (cm) 0 Volume: (cm) 0 % Reduction in Area: 100% % Reduction in Volume: 100% Wound Description Classification: Partial Thickness Periwound Skin Texture Texture Color No Abnormalities Noted: No No Abnormalities Noted: No Moisture No Abnormalities Noted: No Electronic Signature(s) Signed: 09/12/2016 4:47:59 PM By: Elliot Gurney, RN, BSN, Kim RN, BSN [address], [place] ([id number]) Entered By: Elliot Gurney, RN, BSN, Kim on 09/12/2016  10:45:28 [name] ([id number]) -------------------------------------------------------------------------------- Vitals Details [name] Date of Service: 09/12/2016 10:15 AM Patient Name: R. Patient Account Number: [id number] Medical Record Treating RN: Huel Coventry [id number] Number: Other Clinician: Date of Birth/Sex: 02-06-1946 (70 y.o. Male) Treating ROBSON, MICHAEL Primary Care Physician/Extender: Virgil Benedict Physician: Referring Physician: Denton Brick in Treatment: 1 Vital Signs Time Taken: 10:40 Temperature (F): 98.2 Height (in): 72 Pulse (bpm): 76 Weight (lbs): 303 Respiratory Rate (breaths/min): 16 Body Mass Index (BMI): 41.1 Blood Pressure (mmHg): 124/69 Reference Range: 80 - 120 mg / dl Electronic Signature(s) Signed: 09/12/2016 4:47:59 PM By: Elliot Gurney, RN, BSN, Kim RN, BSN Entered By: Elliot Gurney, RN, BSN, Kim on 09/12/2016 10:41:04

## 2016-09-12 NOTE — Progress Notes (Signed)
LENWARD, ABLE ([id number]) Visit Report for 09/12/2016 Chief Complaint Document Details EXODUS, KUTZER Date of Service: 09/12/2016 10:15 AM Patient Name: R. Patient Account Number: [id number] Medical Record Treating RN: Huel Coventry [id number] Number: Other Clinician: Date of Birth/Sex: [birth year] (70 y.o. Male) Treating Victor Daniels Primary Care Physician/Extender: Virgil Benedict Physician: Referring Physician: Denton Brick in Treatment: 1 Information Obtained from: Patient Chief Complaint Patient has bilateral venous stasis with varicosities and post probable thrombosis with hemosiderosis of both of his legs he has 3 ulcers on the posterior aspect of his left leg. 09/05/16; patient is here for review of 3 wounds on the lateral aspect of his left leg. He also states that there is weeping fluid out of his bilateral legs for the last month Electronic Signature(s) Signed: 09/12/2016 3:24:48 PM By: Victor Najjar MD Entered By: Victor Daniels on 09/12/2016 11:22:07 [name] ([id number]) -------------------------------------------------------------------------------- HPI Details Victor Daniels Date of Service: 09/12/2016 10:15 AM Patient Name: R. Patient Account Number: [id number] Medical Record Treating RN: Huel Coventry [id number] Number: Other Clinician: Date of Birth/Sex: 01/09/1946 (70 y.o. Male) Treating Victor Daniels Primary Care Physician/Extender: Virgil Benedict Physician: Referring Physician: Denton Brick in Treatment: 1 History of Present Illness Location: Patient has bilateral venous stasis with ulcerations with inflammation of the left leg Severity: The wounds are very clean and had been improving and measured smaller with silver collagen Duration: The patient has had problems with venous ulcers bilaterally from time to time for the last couple of years HPI Description: He returns today in followup of his left lower  extremity open wound secondary to chronic venous insufficiency and ulceration. He removed his wraps yesterday as he was going to see his primary care physician. His notice some increased drainage from the wound and his primary care doctor increased his diuretics. He has no fevers or chills. He does have compression garments but he does not wear them because her too hard to get on. He is looking into obtaining some with [employer] sides that may be easier for him to put on. In the meantime he would like to continue with his compression wraps. READMISSION 09/05/16; this patient is a 70 year old man who was in this clinic on 2 visits 2 years ago in 2015. At that point he had wounds on his left leg felt to be secondary to chronic venous insufficiency with bilateral hemosiderin deposition. He ultimately was discharged with graded pressure stockings. The patient states he has stockings and is reasonably compliant with them we didn't come in with him on today. Is apparently a borderline diabetic not currently on any treatment undergoing nonpharmacologic therapy with diet etc. The patient states he is actually gained weight however. We month ago the patient started to notice more edema in both legs. The skin cracked open and he has had 3 draining areas on the left leg. He states that both legs drain so that night he'll often wake up with his lower pajama legs wet. He has not felt systemically unwell. He does have some shortness of breath but no chest pain. He tells me that he went to see Dr. dew of vascular surgery and had some tests done in his legs. He was told that nothing further could be done surgically for this. I don't have these notes order these tests at this point. His ABIs in this clinic were noncompressible at 1.57 on the left and 1.53 on the right 09/12/16; the patient had to take his wraps off on Sunday  when he got the dressings wet. Nevertheless all his wound areas have closed there is no open  area. He has severe bilateral venous insufficiency and has recently been to see Dr. dew Y haven't actually seen these records. Brought in the stockings he has these are 15-20 mmHg, this will be insufficient for him he will need at least 20-30 Hg perhaps 30-40s. We have given the address of elastic therapy and [place], with the measurements we should be able to get hematocrit compression by mail [name], Gentle R. ([id number]) Electronic Signature(s) Signed: 09/12/2016 3:24:48 PM By: Victor Najjarobson, Michael MD Entered By: Victor Najjarobson, Daniels on 09/12/2016 11:27:01 [name], Victor R. ([id number]) -------------------------------------------------------------------------------- Physical Exam Details Victor PinkKIRCHGESSNER, Senay Date of Service: 09/12/2016 10:15 AM Patient Name: R. Patient Account Number: [id number] Medical Record Treating RN: Huel CoventryWoody, Kim [id number] Number: Other Clinician: Date of Birth/Sex: 03/31/1946 (70 y.o. Male) Treating Victor Daniels Primary Care Physician/Extender: Virgil BenedictG Tullo, Teresa Physician: Referring Physician: Denton Brickullo, Teresa Weeks in Treatment: 1 Constitutional Sitting or standing Blood Pressure is within target range for patient.. Pulse regular and within target range for patient.Marland Kitchen. Respirations regular, non-labored and within target range.. Temperature is normal and within the target range for the patient.. Patient's appearance is neat and clean. Appears in no acute distress. Well nourished and well developed.. Cardiovascular Pedal pulses palpable and strong bilaterally.. Notes Wound exam; the patient has severe bilateral venous insufficiency with hemosiderin deposition. In spite of the fact that he took his wraps off 2 days ago, the edema control here looks quite adequate. His wounds have closed over. There is no evidence of infection Electronic Signature(s) Signed: 09/12/2016 3:24:48 PM By: Victor Najjarobson, Michael MD Entered By: Victor Najjarobson, Daniels on 09/12/2016  11:28:21 [name], Victor R. ([id number]) -------------------------------------------------------------------------------- Physician Orders Details Victor PinkKIRCHGESSNER, Mahamadou Date of Service: 09/12/2016 10:15 AM Patient Name: R. Patient Account Number: [id number] Medical Record Treating RN: Huel CoventryWoody, Kim [id number] Number: Other Clinician: Date of Birth/Sex: 03/31/1946 (70 y.o. Male) Treating Leanord HawkingOBSON, Daniels Primary Care Physician/Extender: Virgil BenedictG Tullo, Teresa Physician: Referring Physician: Denton Brickullo, Teresa Weeks in Treatment: 1 Verbal / Phone Orders: Yes Clinician: Huel CoventryWoody, Kim Read Back and Verified: Yes Diagnosis Coding Edema Control o Patient to wear own compression stockings [facility] Discharge from W[facility] Care Center - Treatment complete Electronic Signature(s) Signed: 09/12/2016 3:24:48 PM By: Victor Najjarobson, Michael MD Signed: 09/12/2016 4:47:59 PM By: Elliot GurneyWoody, RN, BSN, Kim RN, BSN Entered By: Elliot GurneyWoody, RN, BSN, Kim on 09/12/2016 10:58:15 [name], Vanderbilt R. ([id number]) -------------------------------------------------------------------------------- Problem List Details Victor PinkKIRCHGESSNER, Fallon Date of Service: 09/12/2016 10:15 AM Patient Name: R. Patient Account Number: [id number] Medical Record Treating RN: Huel CoventryWoody, Kim [id number] Number: Other Clinician: Date of Birth/Sex: 03/31/1946 (70 y.o. Male) Treating Leanord HawkingOBSON, Daniels Primary Care Physician/Extender: Virgil BenedictG Tullo, Teresa Physician: Referring Physician: Denton Brickullo, Teresa Weeks in Treatment: 1 Active Problems ICD-10 Encounter Code Description Active Date Diagnosis I87.333 Chronic venous hypertension (idiopathic) with ulcer and 09/05/2016 Yes inflammation of bilateral lower extremity L97.221 Non-pressure chronic ulcer of left calf limited to 09/05/2016 Yes breakdown of skin L97.211 Non-pressure chronic ulcer of right calf limited to 09/05/2016 Yes breakdown of skin Inactive Problems Resolved Problems Electronic  Signature(s) Signed: 09/12/2016 3:24:48 PM By: Victor Najjarobson, Michael MD Entered By: Victor Najjarobson, Daniels on 09/12/2016 11:21:49 [name], Chapin R. ([id number]) -------------------------------------------------------------------------------- Progress Note Details Victor PinkKIRCHGESSNER, Vencent Date of Service: 09/12/2016 10:15 AM Patient Name: R. Patient Account Number: [id number] Medical Record Treating RN: Huel CoventryWoody, Kim [id number] Number: Other Clinician: Date of Birth/Sex: 03/31/1946 (70 y.o. Male) Treating Leanord HawkingOBSON, Daniels Primary Care Physician/Extender: Virgil BenedictG Tullo, Teresa Physician: Referring Physician: [name],  Rupert Stacks in Treatment: 1 Subjective Chief Complaint Information obtained from Patient Patient has bilateral venous stasis with varicosities and post probable thrombosis with hemosiderosis of both of his legs he has 3 ulcers on the posterior aspect of his left leg. 09/05/16; patient is here for review of 3 wounds on the lateral aspect of his left leg. He also states that there is weeping fluid out of his bilateral legs for the last month History of Present Illness (HPI) The following HPI elements were documented for the patient's wound: Location: Patient has bilateral venous stasis with ulcerations with inflammation of the left leg Severity: The wounds are very clean and had been improving and measured smaller with silver collagen Duration: The patient has had problems with venous ulcers bilaterally from time to time for the last couple of years He returns today in followup of his left lower extremity open wound secondary to chronic venous insufficiency and ulceration. He removed his wraps yesterday as he was going to see his primary care physician. His notice some increased drainage from the wound and his primary care doctor increased his diuretics. He has no fevers or chills. He does have compression garments but he does not wear them because her too hard to get on. He is looking into  obtaining some with [employer] sides that may be easier for him to put on. In the meantime he would like to continue with his compression wraps. READMISSION 09/05/16; this patient is a 70 year old man who was in this clinic on 2 visits 2 years ago in 2015. At that point he had wounds on his left leg felt to be secondary to chronic venous insufficiency with bilateral hemosiderin deposition. He ultimately was discharged with graded pressure stockings. The patient states he has stockings and is reasonably compliant with them we didn't come in with him on today. Is apparently a borderline diabetic not currently on any treatment undergoing nonpharmacologic therapy with diet etc. The patient states he is actually gained weight however. We month ago the patient started to notice more edema in both legs. The skin cracked open and he has had 3 draining areas on the left leg. He states that both legs drain so that night he'll often wake up with his lower pajama legs wet. He has not felt systemically unwell. He does have some shortness of breath but no chest pain. He tells me that he went to see Dr. dew of vascular surgery and had some tests done in his legs. He was ABIMAEL, ZEITER ([id number]) told that nothing further could be done surgically for this. I don't have these notes order these tests at this point. His ABIs in this clinic were noncompressible at 1.57 on the left and 1.53 on the right 09/12/16; the patient had to take his wraps off on Sunday when he got the dressings wet. Nevertheless all his wound areas have closed there is no open area. He has severe bilateral venous insufficiency and has recently been to see Dr. dew Y haven't actually seen these records. Brought in the stockings he has these are 15-20 mmHg, this will be insufficient for him he will need at least 20-30 Hg perhaps 30-40s. We have given the address of elastic therapy and [place], with the measurements we should be able to  get hematocrit compression by mail Objective Constitutional Sitting or standing Blood Pressure is within target range for patient.. Pulse regular and within target range for patient.Marland Kitchen Respirations regular, non-labored and within target range.. Temperature is  normal and within the target range for the patient.. Patient's appearance is neat and clean. Appears in no acute distress. Well nourished and well developed.. Vitals Time Taken: 10:40 AM, Height: 72 in, Weight: 303 lbs, BMI: 41.1, Temperature: 98.2 F, Pulse: 76 bpm, Respiratory Rate: 16 breaths/min, Blood Pressure: 124/69 mmHg. Cardiovascular Pedal pulses palpable and strong bilaterally.. General Notes: Wound exam; the patient has severe bilateral venous insufficiency with hemosiderin deposition. In spite of the fact that he took his wraps off 2 days ago, the edema control here looks quite adequate. His wounds have closed over. There is no evidence of infection Integumentary (Hair, Skin) Wound #4 status is Open. Original cause of wound was Blister. The wound is located on the Left Lower Leg. The wound measures 0cm length x 0cm width x 0cm depth; 0cm^2 area and 0cm^3 volume. Wound #5 status is Open. Original cause of wound was Blister. The wound is located on the Right Lower Leg. The wound measures 0cm length x 0cm width x 0cm depth; 0cm^2 area and 0cm^3 volume. Assessment Active Problems ICD-10 PRINTICE, HELLMER ([id number]) I87.333 - Chronic venous hypertension (idiopathic) with ulcer and inflammation of bilateral lower extremity L97.221 - Non-pressure chronic ulcer of left calf limited to breakdown of skin L97.211 - Non-pressure chronic ulcer of right calf limited to breakdown of skin Plan Edema Control: Patient to wear own compression stockings Discharge From [facility] Services: Discharge from Wound Care Center - Treatment complete #1 the patient can be discharged from the clinic. He apparently also has Zipper stockings that  apparently do not fit him. Electronic Signature(s) Signed: 09/12/2016 3:24:48 PM By: Victor Najjar MD Entered By: Victor Daniels on 09/12/2016 11:29:05 [name] ([id number]) -------------------------------------------------------------------------------- [place] Details Victor Daniels Date of Service: 09/12/2016 Patient Name: R. Patient Account Number: [id number] Medical Record Treating RN: Huel Coventry [id number] Number: Other Clinician: Date of Birth/Sex: May 22, 1946 (69 y.o. Male) Treating Leanord Hawking, Daniels Primary Care Physician/Extender: Virgil Benedict Physician: Tania Ade in Treatment: 1 Referring Physician: Duncan Dull Diagnosis Coding ICD-10 Codes Code Description Chronic venous hypertension (idiopathic) with ulcer and inflammation of bilateral lower I87.333 extremity L97.221 Non-pressure chronic ulcer of left calf limited to breakdown of skin L97.211 Non-pressure chronic ulcer of right calf limited to breakdown of skin Facility Procedures CPT4 Code: 08657846 Description: 96295 - WOUND CARE VISIT-LEV 2 EST PT Modifier: Quantity: 1 Physician Procedures CPT4: Description Modifier Quantity Code 2841324 40102 - WC PHYS LEVEL 2 - EST PT 1 ICD-10 Description Diagnosis I87.333 Chronic venous hypertension (idiopathic) with ulcer and inflammation of bilateral lower extremity Electronic Signature(s) Signed: 09/12/2016 3:24:48 PM By: Victor Najjar MD Entered By: Victor Daniels on 09/12/2016 11:29:44

## 2016-09-13 ENCOUNTER — Telehealth: Payer: Self-pay | Admitting: Pulmonary Disease

## 2016-09-13 MED ORDER — ARFORMOTEROL TARTRATE 15 MCG/2ML IN NEBU: 2.0000 mL | INHALATION_SOLUTION | Freq: Two times a day (BID) | RESPIRATORY_TRACT | 5 refills | Status: DC

## 2016-09-13 MED ORDER — BUDESONIDE 0.25 MG/2ML IN SUSP: 0.2500 mg | Freq: Two times a day (BID) | RESPIRATORY_TRACT | 5 refills | Status: DC

## 2016-09-13 NOTE — Telephone Encounter (Signed)
Have printed for DS to sign. Once signed will send to [facility].

## 2016-09-13 NOTE — Telephone Encounter (Signed)
I have printed RXs and placed in your folder to be signed. Once signed I will send to [facility].

## 2016-09-13 NOTE — Telephone Encounter (Signed)
Amber from [facility] calling  Stating they need us to send a  prescription for Pulmicort   Please call back if we have any questions

## 2016-09-14 DIAGNOSIS — J449 Chronic obstructive pulmonary disease, unspecified: Secondary | ICD-10-CM | POA: Diagnosis not present

## 2016-09-14 NOTE — Telephone Encounter (Signed)
RXs signed by DS and Bjorn LoserRhonda will fax to [facility]. Nothing further needed.

## 2016-09-15 ENCOUNTER — Other Ambulatory Visit: Payer: Self-pay | Admitting: Internal Medicine

## 2016-09-15 ENCOUNTER — Encounter: Payer: Self-pay | Admitting: Internal Medicine

## 2016-09-20 ENCOUNTER — Encounter: Payer: Self-pay | Admitting: Pulmonary Disease

## 2016-09-20 ENCOUNTER — Other Ambulatory Visit: Payer: Self-pay | Admitting: Internal Medicine

## 2016-09-21 ENCOUNTER — Telehealth: Payer: Self-pay | Admitting: Pulmonary Disease

## 2016-09-21 NOTE — Telephone Encounter (Signed)
Pharmacist needs ok to send pt new mouthpiece, states cup is not misting. Please call and advise if this is ok to send more supplies.

## 2016-09-21 NOTE — Telephone Encounter (Signed)
Spoke with Albin Fellingarla with [facility] and informed her that it is okay to refill patients neb supplies.  Nothing further needed.

## 2016-10-04 ENCOUNTER — Other Ambulatory Visit: Payer: Self-pay | Admitting: Internal Medicine

## 2016-10-05 ENCOUNTER — Ambulatory Visit (INDEPENDENT_AMBULATORY_CARE_PROVIDER_SITE_OTHER): Payer: Commercial Managed Care - HMO

## 2016-10-05 ENCOUNTER — Telehealth: Payer: Self-pay | Admitting: Internal Medicine

## 2016-10-05 ENCOUNTER — Encounter: Payer: Self-pay | Admitting: Internal Medicine

## 2016-10-05 ENCOUNTER — Other Ambulatory Visit (INDEPENDENT_AMBULATORY_CARE_PROVIDER_SITE_OTHER): Payer: Commercial Managed Care - HMO

## 2016-10-05 ENCOUNTER — Ambulatory Visit (INDEPENDENT_AMBULATORY_CARE_PROVIDER_SITE_OTHER): Payer: Commercial Managed Care - HMO | Admitting: Internal Medicine

## 2016-10-05 VITALS — BP 148/90 | HR 71

## 2016-10-05 DIAGNOSIS — R062 Wheezing: Secondary | ICD-10-CM

## 2016-10-05 DIAGNOSIS — J9620 Acute and chronic respiratory failure, unspecified whether with hypoxia or hypercapnia: Secondary | ICD-10-CM | POA: Insufficient documentation

## 2016-10-05 DIAGNOSIS — R7989 Other specified abnormal findings of blood chemistry: Secondary | ICD-10-CM

## 2016-10-05 DIAGNOSIS — J9621 Acute and chronic respiratory failure with hypoxia: Secondary | ICD-10-CM | POA: Diagnosis not present

## 2016-10-05 DIAGNOSIS — R05 Cough: Secondary | ICD-10-CM | POA: Diagnosis not present

## 2016-10-05 DIAGNOSIS — E291 Testicular hypofunction: Secondary | ICD-10-CM | POA: Diagnosis not present

## 2016-10-05 MED ORDER — ALBUTEROL SULFATE (2.5 MG/3ML) 0.083% IN NEBU
2.5000 mg | INHALATION_SOLUTION | Freq: Once | RESPIRATORY_TRACT | Status: AC
  Administered 2016-10-05: 2.5 mg via RESPIRATORY_TRACT

## 2016-10-05 MED ORDER — LEVOFLOXACIN 500 MG PO TABS: 500.0000 mg | ORAL_TABLET | Freq: Every day | ORAL | 0 refills | Status: DC

## 2016-10-05 MED ORDER — PREDNISONE 10 MG PO TABS: 10.0000 mg | ORAL_TABLET | Freq: Every day | ORAL | 0 refills | Status: DC

## 2016-10-05 MED ORDER — METHYLPREDNISOLONE ACETATE 80 MG/ML IJ SUSP
80.0000 mg | Freq: Once | INTRAMUSCULAR | Status: AC
  Administered 2016-10-05: 80 mg via INTRAMUSCULAR

## 2016-10-05 MED ORDER — ALBUTEROL SULFATE (2.5 MG/3ML) 0.083% IN NEBU: 2.5000 mg | INHALATION_SOLUTION | Freq: Four times a day (QID) | RESPIRATORY_TRACT | 1 refills | Status: DC | PRN

## 2016-10-05 NOTE — Assessment & Plan Note (Signed)
Secondary to COPd ,  CXR pending. He is hyPoxic at rest  And drops to 83% on room air with ambulation.  See RN note .  Supplemental 02 ordered for home use as patient refuses to go to ER .  Prednisone 60 mg bid x 3 days followed by 6 day taper,  levaquin and ALBUTEROL NEBS.  HAS PULMONOLOGY FOLLOW UP ON MONDAY

## 2016-10-05 NOTE — Telephone Encounter (Signed)
Sheena from [facility] lvm stating that the order that was faxed for pt's home oxygen did not have a diagnosis on it. Please fax back with diagnosis and they can get his oxygen out to him.

## 2016-10-05 NOTE — Progress Notes (Signed)
Subjective:  Patient ID: Victor Daniels, male    DOB: 1946-04-10  Age: 70 y.o. MRN: [id number]  CC: The primary encounter diagnosis was Wheezing. A diagnosis of Acute on chronic respiratory failure with hypoxia (HCC) was also pertinent to this visit.  HPI STEWART SASAKI presents for urgent work in wheezing and shortness of breath. Patient was here for lab draw and requested refill on albuterol nebs and prednisone.  Assessed rapidly by LPM Olegario Messier and found to bbe hypoxic and dysnpeic,  Somewhat confused.  Refused to go to ER .  Does not have home O2.   Symptoms have been  getting worse for the past month.  No fevers or chest pain,  Cough at times productive.  Using albuterol nebs but running out.   Outpatient Medications Prior to Visit  Medication Sig Dispense Refill  . amLODipine (NORVASC) 5 MG tablet Take 1 tablet (5 mg total) by mouth daily. 90 tablet 3  . arformoterol (BROVANA) 15 MCG/2ML NEBU Take 2 mLs (15 mcg total) by nebulization 2 (two) times daily. DX: COPD  DX Code: J44.9 120 mL 5  . aspirin 81 MG tablet Take 81 mg by mouth daily.    . budesonide (PULMICORT) 0.25 MG/2ML nebulizer solution Take 2 mLs (0.25 mg total) by nebulization 2 (two) times daily. DX: COPD DX Code: J44.9 120 mL 5  . carvedilol (COREG) 12.5 MG tablet TAKE 1 TABLET TWICE DAILY WITH A MEAL 180 tablet 3  . DEXTROMETHORPHAN HBR PO Take by mouth as needed.    . docusate sodium (COLACE) 100 MG capsule Take 100 mg by mouth daily.    Marland Kitchen losartan-hydrochlorothiazide (HYZAAR) 100-25 MG tablet Take 1 tablet by mouth daily. 90 tablet 3  . Multiple Vitamin (MULTIVITAMIN) capsule Take 1 capsule by mouth daily.    . nitroGLYCERIN (NITROSTAT) 0.4 MG SL tablet Place 1 tablet (0.4 mg total) under the tongue every 5 (five) minutes as needed for chest pain. Maximum dose 3 tablets (Patient not taking: Reported on 09/01/2016) 50 tablet 3  . omeprazole (PRILOSEC) 40 MG capsule Take 1 capsule (40 mg total) by mouth daily. 30  capsule 5  . oxyCODONE (ROXICODONE) 15 MG immediate release tablet Take 1 tablet (15 mg total) by mouth every 8 (eight) hours as needed for pain. 90 tablet 0  . simvastatin (ZOCOR) 20 MG tablet TAKE 1 TABLET AT BEDTIME 90 tablet 2  . SPIRIVA RESPIMAT 2.5 MCG/ACT AERS INHALE TWO SPRAY(S) BY MOUTH ONCE DAILY 4 g 3  . Tiotropium Bromide-Olodaterol (STIOLTO RESPIMAT) 2.5-2.5 MCG/ACT AERS Inhale 2 puffs into the lungs daily. 1 Inhaler 11  . VENTOLIN HFA 108 (90 Base) MCG/ACT inhaler INHALE TWO PUFFS INTO LUNGS EVERY 6 HOURS AS NEEDED FOR WHEEZING OR  SHORTNESS  OF  BREATH 18 each 5   No facility-administered medications prior to visit.     Review of Systems;  Patient denies , fevers,unintentional weight loss, skin rash, eye pain, sinus congestion and sinus pain, sore throat, dysphagia,  hemoptysis , , chest pain, palpitations,, edema, abdominal pain, nausea, melena, diarrhea, constipation, flank pain, dysuria, hematuria, urinary  Frequency, nocturia, numbness, tingling, seizures,  Focal weakness, Loss of consciousness,  Tremor, insomnia, depression, anxiety, and suicidal ideation.      Objective:  BP (!) 148/90 (BP Location: Right Arm, Patient Position: Sitting, Cuff Size: Large)   Pulse 71   SpO2 92%   BP Readings from Last 3 Encounters:  10/05/16 (!) 148/90  09/01/16 130/76  05/31/16 112/66  Wt Readings from Last 3 Encounters:  09/01/16 (!) 303 lb 8 oz (137.7 kg)  05/31/16 296 lb 8 oz (134.5 kg)  05/19/16 (!) 301 lb (136.5 kg)    General appearance: alert, cooperative and appears stated age Ears: normal TM's and external ear canals both ears Throat: lips, mucosa, and tongue normal; teeth and gums normal Neck: no adenopathy, no carotid bruit, supple, symmetrical, trachea midline and thyroid not enlarged, symmetric, no tenderness/mass/nodules Back: symmetric, no curvature. ROM normal. No CVA tenderness. Lungs: decreased air movement bilaterally Heart: regular rate and rhythm, S1,  S2 normal, no murmur, click, rub or gallop Abdomen: soft, non-tender; bowel sounds normal; no masses,  no organomegaly Pulses: 2+ and symmetric Skin: Skin color, texture, turgor normal. No rashes or lesions Lymph nodes: Cervical, supraclavicular, and axillary nodes normal.  Lab Results  Component Value Date   HGBA1C 6.2 09/01/2016   HGBA1C 6.2 05/29/2016   HGBA1C 6.4 02/23/2016    Lab Results  Component Value Date   CREATININE 1.32 09/01/2016   CREATININE 1.35 05/29/2016   CREATININE 1.33 02/23/2016    Lab Results  Component Value Date   WBC 7.6 02/23/2016   HGB 12.6 (L) 02/23/2016   HCT 37.6 (L) 02/23/2016   PLT 182.0 02/23/2016   GLUCOSE 116 (H) 09/01/2016   CHOL 121 09/01/2016   TRIG 122.0 09/01/2016   HDL 42.10 09/01/2016   LDLCALC 54 09/01/2016   ALT 16 09/01/2016   AST 17 09/01/2016   NA 139 09/01/2016   K 3.6 09/01/2016   CL 98 09/01/2016   CREATININE 1.32 09/01/2016   BUN 20 09/01/2016   CO2 36 (H) 09/01/2016   TSH 1.67 12/24/2014   INR 0.9 04/02/2015   HGBA1C 6.2 09/01/2016   MICROALBUR <0.7 09/01/2016    Dg Chest 2 View  Result Date: 05/19/2016 CLINICAL DATA:  Shortness of Breath EXAM: CHEST  2 VIEW COMPARISON:  04/02/2015 FINDINGS: Cardiac shadow is stable. The left lung remains clear. Scarring is noted in the right lung somewhat improved from the prior exam. No acute infiltrate is noted. No bony abnormality is noted. IMPRESSION: Chronic changes in the right lung base.  No acute abnormality noted. Electronically Signed   By: Alcide CleverMark  Lukens M.D.   On: 05/19/2016 14:18    Assessment & Plan:   Problem List Items Addressed This Visit    Respiratory failure, acute-on-chronic (HCC)    Secondary to COPd ,  CXR pending. He is hyPoxic at rest  And drops to 83% on room air with ambulation.  See RN note .  Supplemental 02 ordered for home use as patient refuses to go to ER .  Prednisone 60 mg bid x 3 days followed by 6 day taper,  levaquin and ALBUTEROL NEBS.  HAS  PULMONOLOGY FOLLOW UP ON MONDAY        Other Visit Diagnoses    Wheezing    -  Primary   Relevant Medications   methylPREDNISolone acetate (DEPO-MEDROL) injection 80 mg (Completed)   albuterol (PROVENTIL) (2.5 MG/3ML) 0.083% nebulizer solution 2.5 mg (Completed)   Other Relevant Orders   DG Chest 2 View      I am having [name] start on albuterol, predniSONE, and levofloxacin. I am also having him maintain his aspirin, SPIRIVA RESPIMAT, nitroGLYCERIN, DEXTROMETHORPHAN HBR PO, docusate sodium, multivitamin, Tiotropium Bromide-Olodaterol, omeprazole, losartan-hydrochlorothiazide, amLODipine, oxyCODONE, budesonide, arformoterol, simvastatin, carvedilol, and VENTOLIN HFA. We administered methylPREDNISolone acetate and albuterol.  Meds ordered this encounter  Medications  . methylPREDNISolone acetate (DEPO-MEDROL)  injection 80 mg  . albuterol (PROVENTIL) (2.5 MG/3ML) 0.083% nebulizer solution 2.5 mg  . albuterol (PROVENTIL) (2.5 MG/3ML) 0.083% nebulizer solution    Sig: Take 3 mLs (2.5 mg total) by nebulization every 6 (six) hours as needed for wheezing or shortness of breath.    Dispense:  150 mL    Refill:  1  . predniSONE (DELTASONE) 10 MG tablet    Sig: Take 1 tablet (10 mg total) by mouth daily with breakfast. 6 TABLETS TWICE DAILY FOR 3 DAYS,  THEN START TAKGIN ONCE DAILY AND TAPER DAILY BY 1 TABLET UNTIL GONE    Dispense:  57 tablet    Refill:  0  . levofloxacin (LEVAQUIN) 500 MG tablet    Sig: Take 1 tablet (500 mg total) by mouth daily.    Dispense:  7 tablet    Refill:  0    There are no discontinued medications.  Follow-up: No Follow-up on file.   [name], MD

## 2016-10-05 NOTE — Progress Notes (Signed)
Patient walked in to the office, complaints of SOB and needing medications for wheezing, requested a refill on his ventolin inhaler and albuterol.  Brought patient back to exam room, assessed, wheezing and crackles in bilateral upper lung fields.  Assessed O2 saturations, with ambulation dropped to 86%.  Advised by Dr. [name] to administer 80mg  of depomedrol, apply 2 L [place] and give albuterol treatment as patient refused to go ED.   Post nebulizer treatment patient has OZ saturation of 95% on 2L [place]. Patient using apria for O2 use at home.  O2 recheck was 95% on 2L [place] at 1113am.  Ambulated with oxygen on again, O2 remained in 93-96% range, removed oxygen ambulated >100 feet oxygen decreased to 84% with ambulation on room air. Patient complaints of sob, and dizziness with no oxygen on.   Returned to exam room, oxygen returned to 91-95%.

## 2016-10-05 NOTE — Patient Instructions (Signed)
  You are having a COPD exacerbation  And your oxygen levels are low  Enough to require supplemental oxygen,  Which we are trying to get delivered today    If you become more short of breath than you are now,  You need to go to the nearest ER immediately or call 911  I am treating you  with the following:  Prednisone 60 MG TWICE DAILY FOR 3 DAYS,  THEN START THE tapering dose for the next 6 days   Levaquin once daily  For   7 days (antibiotic) Continue your Advair twice daily .  Use your albuterol nebulizer every 4 to 6 hours  as needed Take Delsym for cough   NeilMed's sinus rinse can be used daily to flush sinuses (use sterile or bottled water ,  Not tap)    Please take a probiotic ( Align, Floraque or Culturelle), or  the generic version of one of these  For a minimum of 3 weeks to prevent a serious antibiotic associated diarrhea  Called clostridium dificile colitis  .  The alternative is to eat a serving of yogurt daily with live cultures.

## 2016-10-06 ENCOUNTER — Telehealth: Payer: Self-pay | Admitting: Internal Medicine

## 2016-10-06 DIAGNOSIS — J449 Chronic obstructive pulmonary disease, unspecified: Secondary | ICD-10-CM | POA: Diagnosis not present

## 2016-10-06 NOTE — Telephone Encounter (Signed)
Victor MessierKathy please advise if this was on the notes earlier?

## 2016-10-06 NOTE — Telephone Encounter (Signed)
Called [employer] and they will contact patient and provide whats needed, for patient and if order is needed will fax to office.

## 2016-10-06 NOTE — Telephone Encounter (Signed)
Pt called about his oxygen tank being delivered today and he was told that the tank needs a regulator on it that dispense it out. Pt needs a Rx [employer] stated Rx needed. Please advise?  Call pt @ [phone]. Thank you!

## 2016-10-08 ENCOUNTER — Encounter: Payer: Self-pay | Admitting: Internal Medicine

## 2016-10-08 LAB — TESTOS,TOTAL,FREE AND SHBG (FEMALE)
SEX HORMONE BINDING GLOB.: 37 nmol/L (ref 22–77)
Testosterone, Free: 155.2 pg/mL — ABNORMAL HIGH (ref 35.0–155.0)
Testosterone,Total,LC/MS/MS: 872 ng/dL (ref 250–1100)

## 2016-10-09 ENCOUNTER — Ambulatory Visit: Payer: Commercial Managed Care - HMO | Admitting: Pulmonary Disease

## 2016-10-09 ENCOUNTER — Encounter: Payer: Self-pay | Admitting: Pulmonary Disease

## 2016-10-09 ENCOUNTER — Ambulatory Visit (INDEPENDENT_AMBULATORY_CARE_PROVIDER_SITE_OTHER): Payer: Commercial Managed Care - HMO | Admitting: Pulmonary Disease

## 2016-10-09 VITALS — BP 150/98 | HR 75 | Ht 72.0 in | Wt 299.0 lb

## 2016-10-09 DIAGNOSIS — R0902 Hypoxemia: Secondary | ICD-10-CM | POA: Diagnosis not present

## 2016-10-09 DIAGNOSIS — R600 Localized edema: Secondary | ICD-10-CM

## 2016-10-09 DIAGNOSIS — J449 Chronic obstructive pulmonary disease, unspecified: Secondary | ICD-10-CM | POA: Diagnosis not present

## 2016-10-09 DIAGNOSIS — R0683 Snoring: Secondary | ICD-10-CM

## 2016-10-09 NOTE — Progress Notes (Addendum)
PULMONARY OFFICE FOLLOW UP NOTE  Requesting MD/Service: [name] Date of initial consultation: 03/20/16 Reason for consultation: COPD, Dyspnea  PT PROFILE: 70 y.o. M former smoker, previously followed by [name] (last seen in 2014) with obesity, COPD (FEV1 40% predicted in 2011) and probable OSA re-evaluated 03/20/16 for progressive DOE. Also with prior hx of cardiomyopathy (LVEF 26% in 2010 but normal 03/02/16)  DATA: 05/17/16 PFTs: severe airflow limitation due to mixed obstruction and restriction. Lung volumes moderately reduced 05/19/16 CXR: Chronic changes in R base  INTERVAL: #-4 weeks ago developed increased wheezing and DOE. Saw Dr [name] last week who prescribed prednisone and levofloxacin. Has one more day of levofloxacin and 5 more days of tapering prednisone. RA SpO2 was 88% in Tullo's office - LTOT initiated  SUBJ: This is a routine office follow up. Events as above. Approaching baseline now. He continues to have class III/IV dyspnea. He is using nebulized budesonide/arformoterol only once a day stating that when he uses it twice a day, he gets blurred vision. He is Spiriva daily as prescribed. He uses albuterol MDI 3-4 X per day with some noted benefit. We again discussed the likelihood of OSA. He has not gone to previously ordered sleep studies and presently states that he will not wear CPAP mask under any circumstances. Denies CP, fever, purulent sputum, hemoptysis, LE edema and calf tenderness.   OBJ:  Vitals:   10/09/16 1135  BP: (!) 150/98  Pulse: 75  SpO2: 98%  Weight: 299 lb (135.6 kg)  Height: 6' (1.829 m)   Room air  EXAM:  Gen: Obese, NAD HEENT: NCAT Neck: No JVD Lungs: breath sounds moderately diminished, No wheezes Cardiovascular: Normal rate, reg rhythm, no murmurs noted Abdomen: Soft, nontender, normal BS Ext: Severe chronic stasis changes, no clubbing or cyanosis, 3+ symmetric edema Neuro: grossly intact  DATA:   BMP Latest Ref Rng & Units  09/01/2016 05/29/2016 02/23/2016  Glucose 70 - 99 mg/dL 981(X116(H) 914(N120(H) 829(F111(H)  BUN 6 - 23 mg/dL 20 62(Z30(H) 30(Q26(H)  Creatinine 0.40 - 1.50 mg/dL 6.571.32 8.461.35 9.621.33  Sodium 135 - 145 mEq/L 139 141 138  Potassium 3.5 - 5.1 mEq/L 3.6 3.6 3.7  Chloride 96 - 112 mEq/L 98 100 97  CO2 19 - 32 mEq/L 36(H) 34(H) 33(H)  Calcium 8.4 - 10.5 mg/dL 8.9 9.6 9.6    CBC Latest Ref Rng & Units 02/23/2016 06/25/2015 04/04/2015  WBC 4.0 - 10.5 K/uL 7.6 7.0 11.0(H)  Hemoglobin 13.0 - 17.0 g/dL 12.6(L) 13.6 11.0(L)  Hematocrit 39.0 - 52.0 % 37.6(L) 41.2 35.1(L)  Platelets 150.0 - 400.0 K/uL 182.0 181.0 150    CXR (10/05/16): CM, chronic RLL scarring, NAD  IMPRESSION:   1) Moderate COPD 2) chronic RLL scarring 3) class II/IV dyspnea 4) obesity and heavy snoring - likely OSA. Refuses further eval for this 5) Recent finding of hypoxemia - presently SpO2 98% on RA 6) Chronic, severe BLE edema/venous stasis - might be a component of cor pulmonale   PLAN:  1) continue current regimen of Pulmicort, Brovana, Spiriva and albuterol  2) Taper prednisone to off as directed by Dr [name] 3) with regard to oxygen, wear it at 2 LPM with exertion and sleep. May have the oxygen off when awake and not exerting himself 4) follow up in 3 months or sooner as needed   Billy Fischeravid Simonds, MD PCCM service Mobile [phone] Pager [phone] 10/09/2016

## 2016-10-09 NOTE — Patient Instructions (Addendum)
1) continue current regimen of Pulmicort, Brovana, Spiriva and albuterol  2) Taper prednisone to off as directed by Dr [name] 3) with regard to oxygen, wear it at 2 LPM with exertion and sleep. You may have the oxygen off when awake and not exerting yourself 4) follow up in 3 months or as needed

## 2016-10-09 NOTE — Telephone Encounter (Signed)
Pt states that he need a RX for automatic co2 regulator for a portable tank.. Please advise pt

## 2016-10-24 ENCOUNTER — Ambulatory Visit (INDEPENDENT_AMBULATORY_CARE_PROVIDER_SITE_OTHER): Payer: Commercial Managed Care - HMO | Admitting: Family

## 2016-10-24 ENCOUNTER — Telehealth: Payer: Self-pay | Admitting: Family

## 2016-10-24 ENCOUNTER — Encounter: Payer: Self-pay | Admitting: Family

## 2016-10-24 ENCOUNTER — Telehealth: Payer: Self-pay | Admitting: *Deleted

## 2016-10-24 ENCOUNTER — Telehealth: Payer: Self-pay | Admitting: Internal Medicine

## 2016-10-24 VITALS — BP 110/64 | HR 82 | Wt 293.0 lb

## 2016-10-24 DIAGNOSIS — J449 Chronic obstructive pulmonary disease, unspecified: Secondary | ICD-10-CM | POA: Diagnosis not present

## 2016-10-24 MED ORDER — PREDNISONE 10 MG PO TABS: ORAL_TABLET | ORAL | 0 refills | Status: DC

## 2016-10-24 NOTE — Telephone Encounter (Signed)
[facility] - [place] TELEPHONE ADVICE RECORD [facility]  Patient Name: Victor Daniels  DOB: [birth year]    Initial Comment wheezing and shortness of breath    Nurse Assessment  Nurse: Dorthula RuePatten, RN, Enrique SackKendra Date/Time (Eastern Time): 10/24/2016 11:56:11 AM  Confirm and document reason for call. If symptomatic, describe symptoms. You must click the next button to save text entered. ---Caller states he is having wheezing and shortness of breath. He states his breathing is changing and the doctor usually calls in the steroids for him. Caller is on home oxygen.  Has the patient traveled out of the country within the last 30 days? ---Not Applicable  Does the patient have any new or worsening symptoms? ---Yes  Will a triage be completed? ---Yes  Related visit to physician within the last 2 weeks? ---No  Does the PT have any chronic conditions? (i.e. diabetes, asthma, etc.) ---Yes  List chronic conditions. ---COPD, CHF, CVA, HTN, on blood thinners, High Cholesterol  Is this a behavioral health or substance abuse call? ---No     Guidelines    Guideline Title Affirmed Question Affirmed Notes  Breathing Difficulty [1] MILD difficulty breathing (e.g., minimal/no SOB at rest, SOB with walking, pulse <100) AND [2] NEW-onset or WORSE than normal    Final Disposition User   See Physician within 4 Hours (or PCP triage) Dorthula RuePatten, RN, Enrique SackKendra    Comments  Scheduled with Rennie PlowmanMargaret Arnett today at 215p   Referrals  REFERRED TO PCP OFFICE   Disagree/Comply: Comply

## 2016-10-24 NOTE — Telephone Encounter (Signed)
Patient scheduled to see NP at 2.15.

## 2016-10-24 NOTE — Patient Instructions (Signed)
Prednisone taper.   Let us know if not better.  Continue to monitor SaO2 and let us know if < 93%.

## 2016-10-24 NOTE — Assessment & Plan Note (Addendum)
SaO2 93%. Afebrile. No acute respiratory distress. Patient I discussed prednisone taper and a platelet declined antibiotic at this time. Patient will continue monitoring SaO2 at home and will let us know if worsening of his shortness of breath. Return precautions given.

## 2016-10-24 NOTE — Telephone Encounter (Signed)
Left message with patient as concerned that another patient's rx may be in AVS. Asked patient to call back after looking through AVS

## 2016-10-24 NOTE — Telephone Encounter (Signed)
Patient stated that he's having some wheezing,and SOB and would like to have a medication called into the pharmacy to help this issue. Pt contact [phone]  *Pt transferred to nurse for SOB
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
## 2016-11-06 DIAGNOSIS — J449 Chronic obstructive pulmonary disease, unspecified: Secondary | ICD-10-CM | POA: Diagnosis not present

## 2016-11-07 ENCOUNTER — Telehealth: Payer: Self-pay | Admitting: Cardiovascular Disease

## 2016-11-07 NOTE — Telephone Encounter (Signed)
3 attempts to schedule fu from recall list.  Deleting recall.  °

## 2016-11-13 ENCOUNTER — Telehealth: Payer: Self-pay | Admitting: *Deleted

## 2016-11-13 DIAGNOSIS — J449 Chronic obstructive pulmonary disease, unspecified: Secondary | ICD-10-CM | POA: Diagnosis not present

## 2016-11-13 NOTE — Telephone Encounter (Signed)
Please triage

## 2016-11-13 NOTE — Telephone Encounter (Signed)
Pt has requested to have a Rx sent to the pharmacy for his congestion  Pt contact [phone] [facility] [address]

## 2016-11-13 NOTE — Telephone Encounter (Signed)
Spoke with patient states for the past week he has been wheezing and having shortness of breath.  Having to use oxygen more with activity.   Scheduled appointment for patient to be evaluated by Rennie PlowmanMargaret Arnett , FNP tomorrow.

## 2016-11-14 ENCOUNTER — Ambulatory Visit (INDEPENDENT_AMBULATORY_CARE_PROVIDER_SITE_OTHER): Payer: Commercial Managed Care - HMO | Admitting: Family

## 2016-11-14 ENCOUNTER — Encounter: Payer: Self-pay | Admitting: Family

## 2016-11-14 VITALS — BP 122/78 | HR 90 | Temp 98.1°F | Ht 72.0 in | Wt 300.8 lb

## 2016-11-14 DIAGNOSIS — J441 Chronic obstructive pulmonary disease with (acute) exacerbation: Secondary | ICD-10-CM

## 2016-11-14 MED ORDER — DOXYCYCLINE HYCLATE 100 MG PO TABS: 100.0000 mg | ORAL_TABLET | Freq: Two times a day (BID) | ORAL | 0 refills | Status: DC

## 2016-11-14 MED ORDER — PREDNISONE 10 MG PO TABS: ORAL_TABLET | ORAL | 0 refills | Status: DC

## 2016-11-14 NOTE — Assessment & Plan Note (Signed)
Afebrile. No acute respiratory distress. SaO2 100% on 2 L submental oxygen. Increased sputum. Trial of prednisone taper alone did not resolve exacerbation at last visit. Patient and I jointly agreed to treat with longer prednisone taper and antibiotic. Return precautions given.

## 2016-11-14 NOTE — Progress Notes (Signed)
Pre visit review using our clinic review tool, if applicable. No additional management support is needed unless otherwise documented below in the visit note. 

## 2016-11-14 NOTE — Patient Instructions (Addendum)
Let us know if you are not better.  If there is no improvement in your symptoms, or if there is any worsening of symptoms, or if you have any additional concerns, please return for re-evaluation; or, if we are closed, consider going to the Emergency Room for evaluation if symptoms urgent.   Chronic Obstructive Pulmonary Disease Exacerbation Chronic obstructive pulmonary disease (COPD) is a common lung condition in which airflow from the lungs is limited. COPD is a general term that can be used to describe many different lung problems that limit airflow, including chronic bronchitis and emphysema. COPD exacerbations are episodes when breathing symptoms become much worse and require extra treatment. Without treatment, COPD exacerbations can be life threatening, and frequent COPD exacerbations can cause further damage to your lungs. What are the causes?  Respiratory infections.  Exposure to smoke.  Exposure to air pollution, chemical fumes, or dust. Sometimes there is no apparent cause or trigger. What increases the risk?  Smoking cigarettes.  Older age.  Frequent prior COPD exacerbations. What are the signs or symptoms?  Increased coughing.  Increased thick spit (sputum) production.  Increased wheezing.  Increased shortness of breath.  Rapid breathing.  Chest tightness. How is this diagnosed? Your medical history, a physical exam, and tests will help your health care provider make a diagnosis. Tests may include:  A chest X-ray.  Basic lab tests.  Sputum testing.  An arterial blood gas test. How is this treated? Depending on the severity of your COPD exacerbation, you may need to be admitted to a hospital for treatment. Some of the treatments commonly used to treat COPD exacerbations are:  Antibiotic medicines.  Bronchodilators. These are drugs that expand the air passages. They may be given with an inhaler or nebulizer. Spacer devices may be needed to help improve drug  delivery.  Corticosteroid medicines.  Supplemental oxygen therapy.  Airway clearing techniques, such as noninvasive ventilation (NIV) and positive expiratory pressure (PEP). These provide respiratory support through a mask or other noninvasive device. Follow these instructions at home:  Do not smoke. Quitting smoking is very important to prevent COPD from getting worse and exacerbations from happening as often.  Avoid exposure to all substances that irritate the airway, especially to tobacco smoke.  If you were prescribed an antibiotic medicine, finish it all even if you start to feel better.  Take all medicines as directed by your health care provider.It is important to use correct technique with inhaled medicines.  Drink enough fluids to keep your urine clear or pale yellow (unless you have a medical condition that requires fluid restriction).  Use a cool mist vaporizer. This makes it easier to clear your chest when you cough.  If you have a home nebulizer and oxygen, continue to use them as directed.  Maintain all necessary vaccinations to prevent infections.  Exercise regularly.  Eat a healthy diet.  Keep all follow-up appointments as directed by your health care provider. Get help right away if:  You have worsening shortness of breath.  You have trouble talking.  You have severe chest pain.  You have blood in your sputum.  You have a fever.  You have weakness, vomit repeatedly, or faint.  You feel confused.  You continue to get worse. This information is not intended to replace advice given to you by your health care provider. Make sure you discuss any questions you have with your health care provider. Document Released: 10/08/2007 Document Revised: 05/18/2016 Document Reviewed: 08/15/2013 Elsevier Interactive Patient Education  2017 Elsevier Inc.  
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
## 2016-11-22 ENCOUNTER — Encounter: Payer: Self-pay | Admitting: Cardiovascular Disease

## 2016-11-22 ENCOUNTER — Ambulatory Visit (INDEPENDENT_AMBULATORY_CARE_PROVIDER_SITE_OTHER): Payer: Commercial Managed Care - HMO | Admitting: Cardiovascular Disease

## 2016-11-22 VITALS — BP 140/80 | HR 76 | Ht 72.0 in | Wt 297.5 lb

## 2016-11-22 DIAGNOSIS — F1011 Alcohol abuse, in remission: Secondary | ICD-10-CM

## 2016-11-22 DIAGNOSIS — I10 Essential (primary) hypertension: Secondary | ICD-10-CM | POA: Diagnosis not present

## 2016-11-22 DIAGNOSIS — I5030 Unspecified diastolic (congestive) heart failure: Secondary | ICD-10-CM | POA: Diagnosis not present

## 2016-11-22 DIAGNOSIS — E782 Mixed hyperlipidemia: Secondary | ICD-10-CM | POA: Diagnosis not present

## 2016-11-22 DIAGNOSIS — R5383 Other fatigue: Secondary | ICD-10-CM

## 2016-11-22 DIAGNOSIS — I2510 Atherosclerotic heart disease of native coronary artery without angina pectoris: Secondary | ICD-10-CM | POA: Diagnosis not present

## 2016-11-22 DIAGNOSIS — G4733 Obstructive sleep apnea (adult) (pediatric): Secondary | ICD-10-CM | POA: Insufficient documentation

## 2016-11-22 DIAGNOSIS — J449 Chronic obstructive pulmonary disease, unspecified: Secondary | ICD-10-CM | POA: Diagnosis not present

## 2016-11-22 DIAGNOSIS — I2583 Coronary atherosclerosis due to lipid rich plaque: Secondary | ICD-10-CM

## 2016-11-22 DIAGNOSIS — J432 Centrilobular emphysema: Secondary | ICD-10-CM

## 2016-11-22 DIAGNOSIS — Z87898 Personal history of other specified conditions: Secondary | ICD-10-CM
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
## 2016-11-22 NOTE — Patient Instructions (Addendum)
Medication Instructions:   Check and make sure losartan HCTZ is taken in the morning  Please ask Dr. [name] about potassium  For worsening shortness of breath, Take lasix 2 to 3 times a week with 2 potassium  Labwork:  No new labs needed  Testing/Procedures:  No further testing at this time   I recommend watching educational videos on topics of interest to you at:       [email or web address].com  Enter code: HEARTCARE    Follow-Up: It was a pleasure seeing you in the office today. Please call us if you have new issues that need to be addressed before your next appt.  [phone]  Your physician wants you to follow-up in: 6 months.  You will receive a reminder letter in the mail two months in advance. If you don't receive a letter, please call our office to schedule the follow-up appointment.  If you need a refill on your cardiac medications before your next appointment, please call your pharmacy.

## 2016-11-29 ENCOUNTER — Telehealth: Payer: Self-pay | Admitting: Pulmonary Disease

## 2016-11-29 ENCOUNTER — Encounter: Payer: Self-pay | Admitting: Cardiovascular Disease

## 2016-11-29 NOTE — Telephone Encounter (Signed)
Pt reports he would like to reschedule his upcoming sleep study due to not sleeping. I have provided pt with [facility] number to call and reschedule. Pt voiced understanding and had no further questions.  Will route to DS for FYI.

## 2016-11-29 NOTE — Telephone Encounter (Signed)
Pt calling wanting to see if he can reschedule his sleep study Is having issues Please advise.

## 2016-12-01 ENCOUNTER — Ambulatory Visit: Payer: Commercial Managed Care - HMO | Admitting: Internal Medicine

## 2016-12-01 ENCOUNTER — Encounter: Payer: Self-pay | Admitting: Internal Medicine

## 2016-12-01 ENCOUNTER — Ambulatory Visit (INDEPENDENT_AMBULATORY_CARE_PROVIDER_SITE_OTHER): Payer: Commercial Managed Care - HMO | Admitting: Internal Medicine

## 2016-12-01 VITALS — BP 140/84 | HR 90 | Temp 97.5°F | Ht 72.0 in | Wt 299.8 lb

## 2016-12-01 DIAGNOSIS — I10 Essential (primary) hypertension: Secondary | ICD-10-CM | POA: Diagnosis not present

## 2016-12-01 DIAGNOSIS — E782 Mixed hyperlipidemia: Secondary | ICD-10-CM

## 2016-12-01 DIAGNOSIS — R7303 Prediabetes: Secondary | ICD-10-CM

## 2016-12-01 DIAGNOSIS — G4733 Obstructive sleep apnea (adult) (pediatric): Secondary | ICD-10-CM | POA: Diagnosis not present

## 2016-12-01 DIAGNOSIS — J441 Chronic obstructive pulmonary disease with (acute) exacerbation: Secondary | ICD-10-CM

## 2016-12-01 DIAGNOSIS — J9621 Acute and chronic respiratory failure with hypoxia: Secondary | ICD-10-CM

## 2016-12-01 DIAGNOSIS — I5030 Unspecified diastolic (congestive) heart failure: Secondary | ICD-10-CM

## 2016-12-01 DIAGNOSIS — J9622 Acute and chronic respiratory failure with hypercapnia: Secondary | ICD-10-CM

## 2016-12-01 DIAGNOSIS — I872 Venous insufficiency (chronic) (peripheral): Secondary | ICD-10-CM

## 2016-12-01 LAB — COMPREHENSIVE METABOLIC PANEL
ALBUMIN: 3.7 g/dL (ref 3.5–5.2)
ALK PHOS: 61 U/L (ref 39–117)
ALT: 18 U/L (ref 0–53)
AST: 15 U/L (ref 0–37)
BILIRUBIN TOTAL: 0.5 mg/dL (ref 0.2–1.2)
BUN: 17 mg/dL (ref 6–23)
CO2: 40 mEq/L — ABNORMAL HIGH (ref 19–32)
Calcium: 9.4 mg/dL (ref 8.4–10.5)
Chloride: 97 mEq/L (ref 96–112)
Creatinine, Ser: 1.11 mg/dL (ref 0.40–1.50)
GFR: 69.58 mL/min (ref >60.00)
Glucose, Bld: 119 mg/dL — ABNORMAL HIGH (ref 70–99)
POTASSIUM: 3.6 meq/L (ref 3.5–5.1)
SODIUM: 141 meq/L (ref 135–145)
TOTAL PROTEIN: 7 g/dL (ref 6.0–8.3)

## 2016-12-01 LAB — LDL CHOLESTEROL, DIRECT: Direct LDL: 72 mg/dL

## 2016-12-01 LAB — HEMOGLOBIN A1C: HEMOGLOBIN A1C: 7 % — AB (ref 4.6–6.5)

## 2016-12-01 MED ORDER — OXYCODONE HCL 15 MG PO TABS: 15.0000 mg | ORAL_TABLET | Freq: Three times a day (TID) | ORAL | 0 refills | Status: DC | PRN

## 2016-12-01 MED ORDER — POTASSIUM CHLORIDE CRYS ER 20 MEQ PO TBCR
20.0000 meq | EXTENDED_RELEASE_TABLET | Freq: Every day | ORAL | 3 refills | Status: DC
Start: 2016-12-01 — End: 2017-01-31

## 2016-12-01 MED ORDER — FUROSEMIDE 20 MG PO TABS: 20.0000 mg | ORAL_TABLET | ORAL | 3 refills | Status: DC

## 2016-12-01 NOTE — Progress Notes (Signed)
Subjective:  Patient ID: Victor Daniels, male    DOB: Nov 06, 1946  Age: 70 y.o. MRN: [id number]  CC: The primary encounter diagnosis was OSA (obstructive sleep apnea). Diagnoses of Essential hypertension, Mixed hyperlipidemia, Prediabetes, COPD exacerbation (HCC), CHF with left ventricular diastolic dysfunction, NYHA class 2 (HCC), Venous insufficiency of both lower extremities, and Acute on chronic respiratory failure with hypoxia and hypercapnia (HCC) were also pertinent to this visit.  HPI Victor Daniels presents for follow up on chronic  issues.    No complaints today.  Using 02 at home  And with ambulation  sats 90% still at rest without it.   Saw Margaret in Nov  Treated with prednisone and doxycycline for COPD exacerbation.  Symptoms have improved and he feels he is at baseilne. .  Trying to get a sleep study set up from [facility] to be done at [facility]. Prior order expired   Ordered it today  Trouble sleeping,  Leaves tv on all night,  Gets up 6 times night to void.  Not sure which is his fluid pill . Medications reviewed with patient.     Outpatient Medications Prior to Visit  Medication Sig Dispense Refill  . albuterol (PROVENTIL) (2.5 MG/3ML) 0.083% nebulizer solution Take 3 mLs (2.5 mg total) by nebulization every 6 (six) hours as needed for wheezing or shortness of breath. 150 mL 1  . amLODipine (NORVASC) 5 MG tablet Take 1 tablet (5 mg total) by mouth daily. 90 tablet 3  . arformoterol (BROVANA) 15 MCG/2ML NEBU Take 2 mLs (15 mcg total) by nebulization 2 (two) times daily. DX: COPD  DX Code: J44.9 120 mL 5  . aspirin 81 MG tablet Take 81 mg by mouth daily.    . budesonide (PULMICORT) 0.25 MG/2ML nebulizer solution Take 2 mLs (0.25 mg total) by nebulization 2 (two) times daily. DX: COPD DX Code: J44.9 120 mL 5  . carvedilol (COREG) 12.5 MG tablet TAKE 1 TABLET TWICE DAILY WITH A MEAL 180 tablet 3  . DEXTROMETHORPHAN HBR PO Take by mouth as needed.    . docusate  sodium (COLACE) 100 MG capsule Take 100 mg by mouth daily.    Marland Kitchen losartan-hydrochlorothiazide (HYZAAR) 100-25 MG tablet Take 1 tablet by mouth daily. 90 tablet 3  . Multiple Vitamin (MULTIVITAMIN) capsule Take 1 capsule by mouth daily.    . nitroGLYCERIN (NITROSTAT) 0.4 MG SL tablet Place 1 tablet (0.4 mg total) under the tongue every 5 (five) minutes as needed for chest pain. Maximum dose 3 tablets 50 tablet 3  . omeprazole (PRILOSEC) 40 MG capsule Take 1 capsule (40 mg total) by mouth daily. 30 capsule 5  . predniSONE (DELTASONE) 10 MG tablet Take 4 tablets ( 40 mg total) by mouth for 3 days; take 3 tablets ( 30 mg total) by mouth for 3 days; take 2 tablets ( 20 mg) by mouth for 3 days; take 1 tablet ( 10 mg total) by mouth for 3 days. 30 tablet 0  . simvastatin (ZOCOR) 20 MG tablet TAKE 1 TABLET AT BEDTIME 90 tablet 2  . SPIRIVA RESPIMAT 2.5 MCG/ACT AERS INHALE TWO SPRAY(S) BY MOUTH ONCE DAILY 4 g 3  . VENTOLIN HFA 108 (90 Base) MCG/ACT inhaler INHALE TWO PUFFS INTO LUNGS EVERY 6 HOURS AS NEEDED FOR WHEEZING OR  SHORTNESS  OF  BREATH 18 each 5  . oxyCODONE (ROXICODONE) 15 MG immediate release tablet Take 1 tablet (15 mg total) by mouth every 8 (eight) hours as needed for pain.  90 tablet 0   No facility-administered medications prior to visit.     Review of Systems;  Patient denies headache, fevers, malaise, unintentional weight loss, skin rash, eye pain, sinus congestion and sinus pain, sore throat, dysphagia,  hemoptysis , cough, dyspnea, wheezing, chest pain, palpitations, orthopnea, edema, abdominal pain, nausea, melena, diarrhea, constipation, flank pain, dysuria, hematuria, urinary  Frequency, nocturia, numbness, tingling, seizures,  Focal weakness, Loss of consciousness,  Tremor, insomnia, depression, anxiety, and suicidal ideation.      Objective:  BP 140/84   Pulse 90   Temp 97.5 F (36.4 C) (Oral)   Ht 6' (1.829 m)   Wt 299 lb 12.8 oz (136 kg)   SpO2 90%   BMI 40.66 kg/m    BP Readings from Last 3 Encounters:  12/01/16 140/84  11/22/16 140/80  11/14/16 122/78    Wt Readings from Last 3 Encounters:  12/01/16 299 lb 12.8 oz (136 kg)  11/22/16 297 lb 8 oz (134.9 kg)  11/14/16 (!) 300 lb 12.8 oz (136.4 kg)    General appearance: alert, cooperative and appears stated age Ears: normal TM's and external ear canals both ears Throat: lips, mucosa, and tongue normal; teeth and gums normal Neck: no adenopathy, no carotid bruit, supple, symmetrical, trachea midline and thyroid not enlarged, symmetric, no tenderness/mass/nodules Back: symmetric, no curvature. ROM normal. No CVA tenderness. Lungs: clear to auscultation bilaterally Heart: regular rate and rhythm, S1, S2 normal, no murmur, click, rub or gallop Abdomen: soft, non-tender; bowel sounds normal; no masses,  no organomegaly Pulses: 2+ and symmetric Skin: Skin color, texture, turgor normal. No rashes or lesions Lymph nodes: Cervical, supraclavicular, and axillary nodes normal.  Lab Results  Component Value Date   HGBA1C 7.0 (H) 12/01/2016   HGBA1C 6.2 09/01/2016   HGBA1C 6.2 05/29/2016    Lab Results  Component Value Date   CREATININE 1.11 12/01/2016   CREATININE 1.32 09/01/2016   CREATININE 1.35 05/29/2016    Lab Results  Component Value Date   WBC 7.6 02/23/2016   HGB 12.6 (L) 02/23/2016   HCT 37.6 (L) 02/23/2016   PLT 182.0 02/23/2016   GLUCOSE 119 (H) 12/01/2016   CHOL 121 09/01/2016   TRIG 122.0 09/01/2016   HDL 42.10 09/01/2016   LDLDIRECT 72.0 12/01/2016   LDLCALC 54 09/01/2016   ALT 18 12/01/2016   AST 15 12/01/2016   NA 141 12/01/2016   K 3.6 12/01/2016   CL 97 12/01/2016   CREATININE 1.11 12/01/2016   BUN 17 12/01/2016   CO2 40 (H) 12/01/2016   TSH 1.67 12/24/2014   INR 0.9 04/02/2015   HGBA1C 7.0 (H) 12/01/2016   MICROALBUR <0.7 09/01/2016    Dg Chest 2 View  Result Date: 05/19/2016 CLINICAL DATA:  Shortness of Breath EXAM: CHEST  2 VIEW COMPARISON:  04/02/2015  FINDINGS: Cardiac shadow is stable. The left lung remains clear. Scarring is noted in the right lung somewhat improved from the prior exam. No acute infiltrate is noted. No bony abnormality is noted. IMPRESSION: Chronic changes in the right lung base.  No acute abnormality noted. Electronically Signed   By: Alcide CleverMark  Lukens M.D.   On: 05/19/2016 14:18    Assessment & Plan:   Problem List Items Addressed This Visit    CHF with left ventricular diastolic dysfunction, NYHA class 2 (HCC)    Reviewed medications.  prescribing lasix for every other day use to manage his pitting edema complicated by venous stasis.  Will change bp meds in January to  d/c the hctz so he can use lasix daily        Relevant Medications   furosemide (LASIX) 20 MG tablet   COPD exacerbation (HCC)    Now resolved clinically,       Hyperlipidemia   Relevant Medications   furosemide (LASIX) 20 MG tablet   Other Relevant Orders   LDL cholesterol, direct (Completed)   Hypertension   Relevant Medications   furosemide (LASIX) 20 MG tablet   Prediabetes   Relevant Orders   Comprehensive metabolic panel (Completed)   Hemoglobin A1c (Completed)   Respiratory failure, acute-on-chronic (HCC)    Sleep study ordered to rule out multiple contributors given concurrent history of snoring and daytime fatigue/hypersomnolence.       Venous insufficiency of leg    Complicated by pitting edema secondary to diastolic dysfunction.   startinge qod lasix/potassium       Relevant Medications   furosemide (LASIX) 20 MG tablet    Other Visit Diagnoses    OSA (obstructive sleep apnea)    -  Primary   Relevant Orders   Ambulatory referral to Sleep Studies      I am having [name] start on furosemide and potassium chloride SA. I am also having him maintain his aspirin, SPIRIVA RESPIMAT, nitroGLYCERIN, DEXTROMETHORPHAN HBR PO, docusate sodium, multivitamin, omeprazole, losartan-hydrochlorothiazide, amLODipine, budesonide,  arformoterol, simvastatin, carvedilol, VENTOLIN HFA, albuterol, predniSONE, and oxyCODONE.  Meds ordered this encounter  Medications  . DISCONTD: oxyCODONE (ROXICODONE) 15 MG immediate release tablet    Sig: Take 1 tablet (15 mg total) by mouth every 8 (eight) hours as needed for pain.    Dispense:  90 tablet    Refill:  0    FOR REFILL ON OR AFTER November 30 2016  . DISCONTD: oxyCODONE (ROXICODONE) 15 MG immediate release tablet    Sig: Take 1 tablet (15 mg total) by mouth every 8 (eight) hours as needed for pain.    Dispense:  90 tablet    Refill:  0    FOR REFILL ON OR AFTER December 31 2016  . oxyCODONE (ROXICODONE) 15 MG immediate release tablet    Sig: Take 1 tablet (15 mg total) by mouth every 8 (eight) hours as needed for pain.    Dispense:  90 tablet    Refill:  0    FOR REFILL ON OR AFTER January 31 2017  . furosemide (LASIX) 20 MG tablet    Sig: Take 1 tablet (20 mg total) by mouth every other day. As needed for fluid retentnion (take with potassium tablet)    Dispense:  45 tablet    Refill:  3  . potassium chloride SA (K-DUR,KLOR-CON) 20 MEQ tablet    Sig: Take 1 tablet (20 mEq total) by mouth daily.    Dispense:  30 tablet    Refill:  3    Medications Discontinued During This Encounter  Medication Reason  . oxyCODONE (ROXICODONE) 15 MG immediate release tablet Reorder  . oxyCODONE (ROXICODONE) 15 MG immediate release tablet Reorder  . oxyCODONE (ROXICODONE) 15 MG immediate release tablet Reorder    Follow-up: Return in about 3 months (around 03/01/2017).   Sherlene ShamsULLO, TERESA L, MD

## 2016-12-01 NOTE — Patient Instructions (Addendum)
I am giving you furosemide  to take every other day in the morning ,  This is a fluid pill You will also  need to take a potassium supplement only on the days you take the furosemide    In January when you  get ready to refill lossartan/hct we will stop the hctz so you can use the furosemide every day     We have ordered your sleep study    We will find out if water can be added to the nighttime oxygen so you don't dry out at night

## 2016-12-01 NOTE — Progress Notes (Signed)
Pre visit review using our clinic review tool, if applicable. No additional management support is needed unless otherwise documented below in the visit note. 

## 2016-12-03 NOTE — Assessment & Plan Note (Signed)
Complicated by pitting edema secondary to diastolic dysfunction.   startinge qod lasix/potassium

## 2016-12-03 NOTE — Assessment & Plan Note (Signed)
Now resolved clinically,

## 2016-12-03 NOTE — Assessment & Plan Note (Addendum)
Sleep study ordered to rule out multiple contributors given concurrent history of snoring and daytime fatigue/hypersomnolence.

## 2016-12-03 NOTE — Assessment & Plan Note (Addendum)
Reviewed medications.  prescribing lasix for every other day use to manage his pitting edema complicated by venous stasis.  Will change bp meds in January to d/c the hctz so he can use lasix daily

## 2016-12-06 DIAGNOSIS — J449 Chronic obstructive pulmonary disease, unspecified: Secondary | ICD-10-CM | POA: Diagnosis not present

## 2016-12-07 ENCOUNTER — Encounter: Payer: Self-pay | Admitting: Internal Medicine

## 2016-12-08 ENCOUNTER — Telehealth: Payer: Self-pay

## 2016-12-08 NOTE — Telephone Encounter (Signed)
Pt vm from pt requesting a new order for sleep study. I spoke with pt who states this has been handled by his PCP. Nothing further needed.

## 2016-12-11 ENCOUNTER — Telehealth: Payer: Self-pay | Admitting: *Deleted

## 2016-12-11 NOTE — Telephone Encounter (Signed)
Patient requested a call in ref to his medications. Pt stated that he was confused on how to take the medications after the changes  Pt contact [phone]

## 2016-12-11 NOTE — Telephone Encounter (Signed)
Patient coming by office for medication review.

## 2016-12-20 DIAGNOSIS — J449 Chronic obstructive pulmonary disease, unspecified: Secondary | ICD-10-CM | POA: Diagnosis not present

## 2017-01-02 ENCOUNTER — Ambulatory Visit: Payer: Medicare HMO | Attending: Otolaryngology

## 2017-01-02 DIAGNOSIS — G4733 Obstructive sleep apnea (adult) (pediatric): Secondary | ICD-10-CM | POA: Diagnosis not present

## 2017-01-02 DIAGNOSIS — F5101 Primary insomnia: Secondary | ICD-10-CM | POA: Insufficient documentation

## 2017-01-02 DIAGNOSIS — E669 Obesity, unspecified: Secondary | ICD-10-CM | POA: Diagnosis not present

## 2017-01-06 DIAGNOSIS — J449 Chronic obstructive pulmonary disease, unspecified: Secondary | ICD-10-CM | POA: Diagnosis not present

## 2017-01-15 ENCOUNTER — Encounter: Payer: Self-pay | Admitting: Internal Medicine

## 2017-01-15 ENCOUNTER — Other Ambulatory Visit: Payer: Self-pay | Admitting: Internal Medicine

## 2017-01-15 DIAGNOSIS — G4733 Obstructive sleep apnea (adult) (pediatric): Secondary | ICD-10-CM

## 2017-01-16 NOTE — Assessment & Plan Note (Signed)
Severe,  AHI 64/hr  .CPAP TITRATION STUDY WITH SLEEP AID ORDERED

## 2017-01-30 ENCOUNTER — Telehealth: Payer: Self-pay | Admitting: Internal Medicine

## 2017-01-30 MED ORDER — PREDNISONE 10 MG PO TABS: ORAL_TABLET | ORAL | 0 refills | Status: DC

## 2017-01-30 MED ORDER — DOXYCYCLINE HYCLATE 100 MG PO TABS: 100.0000 mg | ORAL_TABLET | Freq: Two times a day (BID) | ORAL | 0 refills | Status: DC

## 2017-01-30 NOTE — Telephone Encounter (Signed)
Pt called and stated that he is having chest and head congestion, he also has a sore throat, and runny nose. Pt would like to know if he needs to come in or can Dr. [name] call something in. Please advise, thank you!  Pharmacy - [facility] [address] - [place]), [place] - [address]  Call pt @ [phone]

## 2017-01-30 NOTE — Telephone Encounter (Signed)
Pt called back returning your call. Thank you!  Call pt @ [phone]

## 2017-01-30 NOTE — Telephone Encounter (Signed)
If he is short of breath, he needs to be seen,  If not I will call him doxycycline and a prednisone taper

## 2017-01-30 NOTE — Telephone Encounter (Signed)
Spoke with patient having chest congestion, head congestion coughing  up clear phlegm, no fever, sore throat since this weekend. No OTC meds.   Oxygen Dependent .  Please advise.

## 2017-01-30 NOTE — Telephone Encounter (Signed)
Left message to call.

## 2017-01-30 NOTE — Telephone Encounter (Signed)
Patient states he is having of shortness of breath due to congestion symptoms.  Appointment scheduled to see Rennie PlowmanMargaret Arnett, NP .

## 2017-01-31 ENCOUNTER — Encounter: Payer: Self-pay | Admitting: Family

## 2017-01-31 ENCOUNTER — Ambulatory Visit (INDEPENDENT_AMBULATORY_CARE_PROVIDER_SITE_OTHER): Payer: Medicare HMO | Admitting: Family

## 2017-01-31 VITALS — BP 134/72 | HR 76 | Temp 98.2°F | Ht 72.0 in | Wt 296.8 lb

## 2017-01-31 DIAGNOSIS — J441 Chronic obstructive pulmonary disease with (acute) exacerbation: Secondary | ICD-10-CM

## 2017-01-31 NOTE — Patient Instructions (Signed)
Stay very vigilant.   Let me know if you are not improving on doxycyline, prednisone.    Chronic Obstructive Pulmonary Disease Exacerbation Chronic obstructive pulmonary disease (COPD) is a common lung condition in which airflow from the lungs is limited. COPD is a general term that can be used to describe many different lung problems that limit airflow, including chronic bronchitis and emphysema. COPD exacerbations are episodes when breathing symptoms become much worse and require extra treatment. Without treatment, COPD exacerbations can be life threatening, and frequent COPD exacerbations can cause further damage to your lungs. What are the causes?  Respiratory infections.  Exposure to smoke.  Exposure to air pollution, chemical fumes, or dust. Sometimes there is no apparent cause or trigger. What increases the risk?  Smoking cigarettes.  Older age.  Frequent prior COPD exacerbations. What are the signs or symptoms?  Increased coughing.  Increased thick spit (sputum) production.  Increased wheezing.  Increased shortness of breath.  Rapid breathing.  Chest tightness. How is this diagnosed? Your medical history, a physical exam, and tests will help your health care provider make a diagnosis. Tests may include:  A chest X-ray.  Basic lab tests.  Sputum testing.  An arterial blood gas test. How is this treated? Depending on the severity of your COPD exacerbation, you may need to be admitted to a hospital for treatment. Some of the treatments commonly used to treat COPD exacerbations are:  Antibiotic medicines.  Bronchodilators. These are drugs that expand the air passages. They may be given with an inhaler or nebulizer. Spacer devices may be needed to help improve drug delivery.  Corticosteroid medicines.  Supplemental oxygen therapy.  Airway clearing techniques, such as noninvasive ventilation (NIV) and positive expiratory pressure (PEP). These provide  respiratory support through a mask or other noninvasive device. Follow these instructions at home:  Do not smoke. Quitting smoking is very important to prevent COPD from getting worse and exacerbations from happening as often.  Avoid exposure to all substances that irritate the airway, especially to tobacco smoke.  If you were prescribed an antibiotic medicine, finish it all even if you start to feel better.  Take all medicines as directed by your health care provider.It is important to use correct technique with inhaled medicines.  Drink enough fluids to keep your urine clear or pale yellow (unless you have a medical condition that requires fluid restriction).  Use a cool mist vaporizer. This makes it easier to clear your chest when you cough.  If you have a home nebulizer and oxygen, continue to use them as directed.  Maintain all necessary vaccinations to prevent infections.  Exercise regularly.  Eat a healthy diet.  Keep all follow-up appointments as directed by your health care provider. Get help right away if:  You have worsening shortness of breath.  You have trouble talking.  You have severe chest pain.  You have blood in your sputum.  You have a fever.  You have weakness, vomit repeatedly, or faint.  You feel confused.  You continue to get worse. This information is not intended to replace advice given to you by your health care provider. Make sure you discuss any questions you have with your health care provider. Document Released: 10/08/2007 Document Revised: 05/18/2016 Document Reviewed: 08/15/2013 Elsevier Interactive Patient Education  2017 [employer]

## 2017-01-31 NOTE — Progress Notes (Signed)
Pre visit review using our clinic review tool, if applicable. No additional management support is needed unless otherwise documented below in the visit note. 

## 2017-01-31 NOTE — Assessment & Plan Note (Signed)
Afebrile. Patient in no acute respiratory distress. He is unlabored, talking with ease. He's wearing his oxygen and satting 95%. He just was started on prednisone, doxycycline from his PCP, little too early to tell if he's feeling better yet however he will let me know in the next couple of days if medication regimen is working.
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
## 2017-02-06 DIAGNOSIS — J449 Chronic obstructive pulmonary disease, unspecified: Secondary | ICD-10-CM | POA: Diagnosis not present

## 2017-02-13 ENCOUNTER — Ambulatory Visit: Payer: Medicare HMO | Attending: Neurology

## 2017-02-13 DIAGNOSIS — F5101 Primary insomnia: Secondary | ICD-10-CM | POA: Diagnosis not present

## 2017-02-13 DIAGNOSIS — G4733 Obstructive sleep apnea (adult) (pediatric): Secondary | ICD-10-CM | POA: Insufficient documentation

## 2017-02-23 ENCOUNTER — Ambulatory Visit (INDEPENDENT_AMBULATORY_CARE_PROVIDER_SITE_OTHER): Payer: Medicare HMO

## 2017-02-23 VITALS — BP 136/72 | HR 76 | Temp 98.2°F | Resp 16 | Ht 71.5 in | Wt 297.4 lb

## 2017-02-23 DIAGNOSIS — Z0000 Encounter for general adult medical examination without abnormal findings: Secondary | ICD-10-CM | POA: Diagnosis not present
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
## 2017-02-23 NOTE — Patient Instructions (Addendum)
  Mr. Victor Daniels , Thank you for taking time to come for your Medicare Wellness Visit. I appreciate your ongoing commitment to your health goals. Please review the following plan we discussed and let me know if I can assist you in the future.   Follow up with Dr. [name] as needed.    Have a great day!  These are the goals we discussed: Goals    . Increase physical activity          Self pace with activity       This is a list of the screening recommended for you and due dates:  Health Maintenance  Topic Date Due  . Complete foot exam   10/16/1956  . Colon Cancer Screening  10/16/1996  . Pneumonia vaccines (2 of 2 - PPSV23) 02/22/2017  . Hemoglobin A1C  06/01/2017  . Eye exam for diabetics  06/20/2017  . Tetanus Vaccine  05/25/2025  . Flu Shot  Completed  .  Hepatitis C: One time screening is recommended by Center for Disease Control  (CDC) for  adults born from [id number] through 1965.   Completed

## 2017-02-25 NOTE — Progress Notes (Signed)
  I have reviewed the above information and agree with above.   Dymond Gutt, MD 

## 2017-02-26 ENCOUNTER — Encounter: Payer: Self-pay | Admitting: Internal Medicine

## 2017-02-26 ENCOUNTER — Telehealth: Payer: Self-pay | Admitting: Cardiovascular Disease

## 2017-02-26 NOTE — Telephone Encounter (Signed)
Pt states he does not want to schedule a 6 month recall at this time. He states he is doing fine and does not wish to schedule. He states he will call us if he needs us.

## 2017-03-06 DIAGNOSIS — J449 Chronic obstructive pulmonary disease, unspecified: Secondary | ICD-10-CM | POA: Diagnosis not present

## 2017-03-07 ENCOUNTER — Telehealth: Payer: Self-pay | Admitting: *Deleted

## 2017-03-07 ENCOUNTER — Encounter: Payer: Self-pay | Admitting: Internal Medicine

## 2017-03-07 ENCOUNTER — Ambulatory Visit (INDEPENDENT_AMBULATORY_CARE_PROVIDER_SITE_OTHER): Payer: Medicare HMO | Admitting: Internal Medicine

## 2017-03-07 VITALS — BP 102/60 | HR 77 | Resp 15 | Ht 71.5 in | Wt 283.1 lb

## 2017-03-07 DIAGNOSIS — D692 Other nonthrombocytopenic purpura: Secondary | ICD-10-CM

## 2017-03-07 DIAGNOSIS — G4733 Obstructive sleep apnea (adult) (pediatric): Secondary | ICD-10-CM | POA: Diagnosis not present

## 2017-03-07 DIAGNOSIS — E1159 Type 2 diabetes mellitus with other circulatory complications: Secondary | ICD-10-CM

## 2017-03-07 DIAGNOSIS — J432 Centrilobular emphysema: Secondary | ICD-10-CM

## 2017-03-07 DIAGNOSIS — M25511 Pain in right shoulder: Secondary | ICD-10-CM

## 2017-03-07 DIAGNOSIS — G8929 Other chronic pain: Secondary | ICD-10-CM | POA: Diagnosis not present

## 2017-03-07 DIAGNOSIS — E785 Hyperlipidemia, unspecified: Secondary | ICD-10-CM

## 2017-03-07 DIAGNOSIS — M5116 Intervertebral disc disorders with radiculopathy, lumbar region: Secondary | ICD-10-CM | POA: Diagnosis not present

## 2017-03-07 DIAGNOSIS — M542 Cervicalgia: Secondary | ICD-10-CM | POA: Diagnosis not present

## 2017-03-07 DIAGNOSIS — E119 Type 2 diabetes mellitus without complications: Secondary | ICD-10-CM | POA: Diagnosis not present

## 2017-03-07 DIAGNOSIS — E1169 Type 2 diabetes mellitus with other specified complication: Secondary | ICD-10-CM

## 2017-03-07 LAB — POCT GLYCOSYLATED HEMOGLOBIN (HGB A1C): Hemoglobin A1C: 5.8

## 2017-03-07 MED ORDER — METHOCARBAMOL 500 MG PO TABS: 500.0000 mg | ORAL_TABLET | Freq: Three times a day (TID) | ORAL | 1 refills | Status: DC | PRN

## 2017-03-07 MED ORDER — OXYCODONE HCL 15 MG PO TABS: 15.0000 mg | ORAL_TABLET | Freq: Three times a day (TID) | ORAL | 0 refills | Status: DC | PRN

## 2017-03-07 MED ORDER — OXYCODONE HCL 15 MG PO TABS
15.0000 mg | ORAL_TABLET | Freq: Three times a day (TID) | ORAL | 0 refills | Status: DC | PRN
Start: 2017-03-07 — End: 2017-03-07

## 2017-03-07 NOTE — Telephone Encounter (Signed)
Resent the Rx to [facility], it had been sent to the mail order. thanks

## 2017-03-07 NOTE — Progress Notes (Signed)
Pre visit review using our clinic review tool, if applicable. No additional management support is needed unless otherwise documented below in the visit note. 

## 2017-03-07 NOTE — Progress Notes (Signed)
Subjective:  Patient ID: Victor Daniels, male    DOB: June 04, 1946  Age: 71 y.o. MRN: [id number]  CC: The primary encounter diagnosis was Diabetes mellitus without complication (HCC). Diagnoses of Posterior neck pain, Radiculopathy due to disorder of intervertebral disc of lumbar spine, Chronic right shoulder pain, Senile purpura (HCC), Obstructive sleep apnea, Centrilobular emphysema (HCC), Type 2 diabetes mellitus with other circulatory complications (CODE) (HCC), Cervical spine pain, and Pain in joint of right shoulder were also pertinent to this visit.  HPI Victor Kempdward R Mumpower presents for follow up on multiple  issues including type 2 diabetes mellitus , diet controlled, COPD, OSA and chronic musculoskeletal pain    He was treated for a COPD exacerbation by MA in February without complications or hospital admission.     Sleep study was done  Feb 2018 .  Did not sleep well during the study because the bed was uncomfortable. He also tried sleeping in the rooms' recliner but it was too uncomfortable as well.    He has difficulty maintaining sleep due to chronic neck pain that radiates to the right shoulder .  The pain is aggravated by sitting at his desk using the computer.  He has been using 30 mg of oxycodone at bedtime because  The lower dose of 15 mg did not alleviate his pain at all. He uses his third 15 mg tablet once during the day along with  He has transient relief for about 3-4  Hours, then he is woken up by the pain . He has tried Using ben gay,  Catering managertc . He cannot use NSAIDs long term due to  cardiomyopathy and venous insufficiency with chronic leg edema which is worsened with use of NSAIDs.    He has a history of  Cervical spine decompression with  fusion in 2006.  Failed subsequent interventions (nonsurgical) by prior [facility].  He also has a history of  arthroscopic debridement and rotator cuff surgery on right shoulder [facility] that was done  over  10  years ago.  The surgery was unsuccessful per patient because he states that he did not get rehabbed (per patient Dr Ernest PineHooten went on vacation,  No PT was ordered,  "left him high and dry" so he never went back ).   DIS[facility].   Right hand gets  numb and tingling episodically during the day,  Occurs while driving. Also reporting pain and popping of both thumb joints.    Has not received any order for home CPAP .   Outpatient Medications Prior to Visit  Medication Sig Dispense Refill  . albuterol (PROVENTIL) (2.5 MG/3ML) 0.083% nebulizer solution Take 3 mLs (2.5 mg total) by nebulization every 6 (six) hours as needed for wheezing or shortness of breath. 150 mL 1  . amLODipine (NORVASC) 5 MG tablet Take 1 tablet (5 mg total) by mouth daily. 90 tablet 3  . arformoterol (BROVANA) 15 MCG/2ML NEBU Take 2 mLs (15 mcg total) by nebulization 2 (two) times daily. DX: COPD  DX Code: J44.9 120 mL 5  . aspirin 81 MG tablet Take 81 mg by mouth daily.    . budesonide (PULMICORT) 0.25 MG/2ML nebulizer solution Take 2 mLs (0.25 mg total) by nebulization 2 (two) times daily. DX: COPD DX Code: J44.9 120 mL 5  . carvedilol (COREG) 12.5 MG tablet TAKE 1 TABLET TWICE DAILY WITH A MEAL 180 tablet 3  . docusate sodium (COLACE) 100 MG capsule  Take 100 mg by mouth daily.    Marland Kitchen ipratropium (ATROVENT) 0.03 % nasal spray PLACE 2 SPRAYS INTO BOTH NOSTRILS EVERY 12 (TWELVE) HOURS. 60 mL 12  . Multiple Vitamin (MULTIVITAMIN) capsule Take 1 capsule by mouth daily.    . nitroGLYCERIN (NITROSTAT) 0.4 MG SL tablet Place 1 tablet (0.4 mg total) under the tongue every 5 (five) minutes as needed for chest pain. Maximum dose 3 tablets 50 tablet 3  . omeprazole (PRILOSEC) 40 MG capsule Take 1 capsule (40 mg total) by mouth daily. 30 capsule 5  . simvastatin (ZOCOR) 20 MG tablet TAKE 1 TABLET AT BEDTIME 90 tablet 2  . SPIRIVA RESPIMAT 2.5 MCG/ACT AERS INHALE TWO SPRAY(S) BY MOUTH ONCE DAILY 4 g 3  . VENTOLIN HFA  108 (90 Base) MCG/ACT inhaler INHALE TWO PUFFS INTO LUNGS EVERY 6 HOURS AS NEEDED FOR WHEEZING OR  SHORTNESS  OF  BREATH 18 each 5  . oxyCODONE (ROXICODONE) 15 MG immediate release tablet Take 1 tablet (15 mg total) by mouth every 8 (eight) hours as needed for pain. 90 tablet 0   No facility-administered medications prior to visit.     Review of Systems;  Patient denies headache, fevers, malaise, unintentional weight loss, skin rash, eye pain, sinus congestion and sinus pain, sore throat, dysphagia,  hemoptysis , cough, dyspnea, wheezing, chest pain, palpitations, orthopnea, edema, abdominal pain, nausea, melena, diarrhea, constipation, flank pain, dysuria, hematuria, urinary  Frequency, nocturia, numbness, tingling, seizures,  Focal weakness, Loss of consciousness,  Tremor, insomnia, depression, anxiety, and suicidal ideation.      Objective:  BP 102/60 (BP Location: Left Arm, Patient Position: Sitting, Cuff Size: Large)   Pulse 77   Resp 15   Ht 5' 11.5" (1.816 m)   Wt 283 lb 1.9 oz (128.4 kg)   SpO2 93%   BMI 38.94 kg/m   BP Readings from Last 3 Encounters:  03/07/17 102/60  02/23/17 136/72  01/31/17 134/72    Wt Readings from Last 3 Encounters:  03/07/17 283 lb 1.9 oz (128.4 kg)  02/23/17 297 lb 6.4 oz (134.9 kg)  01/31/17 296 lb 12.8 oz (134.6 kg)    General appearance: alert, cooperative and appears stated age Ears: normal TM's and external ear canals both ears Throat: lips, mucosa, and tongue normal; teeth and gums normal Neck: no adenopathy, no carotid bruit, supple, symmetrical, trachea midline and thyroid not enlarged, symmetric, no tenderness/mass/nodules Back: symmetric, no curvature. ROM normal. No CVA tenderness. Lungs: clear to auscultation bilaterally Heart: regular rate and rhythm, S1, S2 normal, no murmur, click, rub or gallop Abdomen: soft, non-tender; bowel sounds normal; no masses,  no organomegaly Pulses: 2+ and symmetric Skin: Skin color, texture,  turgor normal. No rashes or lesions Lymph nodes: Cervical, supraclavicular, and axillary nodes normal.  Lab Results  Component Value Date   HGBA1C 5.8 03/07/2017   HGBA1C 7.0 (H) 12/01/2016   HGBA1C 6.2 09/01/2016    Lab Results  Component Value Date   CREATININE 1.11 12/01/2016   CREATININE 1.32 09/01/2016   CREATININE 1.35 05/29/2016    Lab Results  Component Value Date   WBC 7.6 02/23/2016   HGB 12.6 (L) 02/23/2016   HCT 37.6 (L) 02/23/2016   PLT 182.0 02/23/2016   GLUCOSE 119 (H) 12/01/2016   CHOL 121 09/01/2016   TRIG 122.0 09/01/2016   HDL 42.10 09/01/2016   LDLDIRECT 72.0 12/01/2016   LDLCALC 54 09/01/2016   ALT 18 12/01/2016   AST 15 12/01/2016   NA 141 12/01/2016  K 3.6 12/01/2016   CL 97 12/01/2016   CREATININE 1.11 12/01/2016   BUN 17 12/01/2016   CO2 40 (H) 12/01/2016   TSH 1.67 12/24/2014   INR 0.9 04/02/2015   HGBA1C 5.8 03/07/2017   MICROALBUR <0.7 09/01/2016    Dg Chest 2 View  Result Date: 05/19/2016 CLINICAL DATA:  Shortness of Breath EXAM: CHEST  2 VIEW COMPARISON:  04/02/2015 FINDINGS: Cardiac shadow is stable. The left lung remains clear. Scarring is noted in the right lung somewhat improved from the prior exam. No acute infiltrate is noted. No bony abnormality is noted. IMPRESSION: Chronic changes in the right lung base.  No acute abnormality noted. Electronically Signed   By: Alcide Clever M.D.   On: 05/19/2016 14:18    Assessment & Plan:   Problem List Items Addressed This Visit    Cervical spine pain    Now with intermitted numbness occurring in the right hand. Unclear whether his current pain is radiculopathy from progressive spinal stenosis or due to  Unresolved MSK issues for right shoulder rotator cuff surgery or plain OA.  Given his need for chronic opioid use to manage pain , MRI cervical spine ordered. Refill history confirmed via [place] Controlled Substance databas, accessed by me today, and 3 months of oxycodone 15 mg  Tid  refills  given. #90/month .      Relevant Medications   oxyCODONE (ROXICODONE) 15 MG immediate release tablet   COPD (chronic obstructive pulmonary disease) (HCC)    Recent exacerbation resolved.  He has documented desaturations with exertion meeting criteria for supplemental oxygen,  Which was ordered in October 2017      Obstructive sleep apnea    Severe,  AHI 64/hr  .CPAP TITRATION STUDY WITH SLEEP AID has been done,  And the order for cpap with a setting of 13 c H20 was sent to facility on March 5 . Patient states he has not received any communication or CPAP machine       Pain in joint, shoulder region    Prior rotator cuff surgery by Dr Ernest Pine in 2006? Referral to Dr Joice Lofts in progress       Senile purpura (HCC)    Aggravated by venous insufficiency and use of aspirin .  Lab Results  Component Value Date   WBC 7.6 02/23/2016   HGB 12.6 (L) 02/23/2016   HCT 37.6 (L) 02/23/2016   MCV 85.9 02/23/2016   PLT 182.0 02/23/2016         Type 2 diabetes mellitus with other circulatory complications (CODE) (HCC)    His A1c has improved from  7.0 3 months ago to 5.8 with carbohydrate reduction.  Continue asa and statin.  He has no proteinuria and has annual eye exams. He is due for the final Pneumovax vaccine.   Lab Results  Component Value Date   HGBA1C 5.8 03/07/2017   Lab Results  Component Value Date   MICROALBUR <0.7 09/01/2016          Other Visit Diagnoses    Diabetes mellitus without complication (HCC)    -  Primary   Relevant Orders   POCT HgB A1C (Completed)   Posterior neck pain       Relevant Medications   oxyCODONE (ROXICODONE) 15 MG immediate release tablet   Other Relevant Orders   MR Cervical Spine Wo Contrast   Radiculopathy due to disorder of intervertebral disc of lumbar spine       Relevant Orders   MR Cervical  Spine Wo Contrast   Chronic right shoulder pain       Relevant Orders   Ambulatory referral to Orthopedic Surgery     A total of 40 minutes  was spent with patient more than half of which was spent in counseling patient on the above mentioned issues , reviewing and explaining recent labs and imaging studies done, and coordination of care. I am having Mr. Baley maintain his aspirin, SPIRIVA RESPIMAT, nitroGLYCERIN, docusate sodium, multivitamin, omeprazole, amLODipine, budesonide, arformoterol, simvastatin, carvedilol, VENTOLIN HFA, albuterol, ipratropium, and oxyCODONE.  Meds ordered this encounter  Medications  . DISCONTD: methocarbamol (ROBAXIN) 500 MG tablet    Sig: Take 1 tablet (500 mg total) by mouth every 8 (eight) hours as needed for muscle spasms.    Dispense:  90 tablet    Refill:  1  . DISCONTD: oxyCODONE (ROXICODONE) 15 MG immediate release tablet    Sig: Take 1 tablet (15 mg total) by mouth every 8 (eight) hours as needed for pain.    Dispense:  90 tablet    Refill:  0    For refill on or after March 19 2017  . DISCONTD: oxyCODONE (ROXICODONE) 15 MG immediate release tablet    Sig: Take 1 tablet (15 mg total) by mouth every 8 (eight) hours as needed for pain.    Dispense:  90 tablet    Refill:  0    For refill on or after April 19 2017  . oxyCODONE (ROXICODONE) 15 MG immediate release tablet    Sig: Take 1 tablet (15 mg total) by mouth every 8 (eight) hours as needed for pain.    Dispense:  90 tablet    Refill:  0    For refill on or after May 19 2017    Medications Discontinued During This Encounter  Medication Reason  . oxyCODONE (ROXICODONE) 15 MG immediate release tablet Reorder  . oxyCODONE (ROXICODONE) 15 MG immediate release tablet Reorder  . oxyCODONE (ROXICODONE) 15 MG immediate release tablet Reorder    Follow-up: Return in about 3 months (around 06/07/2017) for fasting labs same day or prior .   [name], MD

## 2017-03-07 NOTE — Telephone Encounter (Signed)
[facility] .  lmtc for patient

## 2017-03-07 NOTE — Telephone Encounter (Signed)
Patient stated that the pharmacy did not receive Rx's from today's visit.  Pharmacy [facility] on [address]

## 2017-03-07 NOTE — Patient Instructions (Addendum)
   I am adding muscle relaxer for evening  And bedtime use.  You can continue the 30 mg oxycodone . I have refilled your medication for 3 months  I am ordering an MRI of your cervical spine (neck) to see if you have a pinched nerve causing the numbness in your right hand.    I have also made a referral to Dr Joice LoftsPoggi for evaluation of your right shoulder pain and limited mobility

## 2017-03-08 ENCOUNTER — Telehealth: Payer: Self-pay

## 2017-03-08 NOTE — Telephone Encounter (Signed)
Pharmacy has received the RX, it needs a PA so I have called the patient to make him aware. thanks

## 2017-03-08 NOTE — Telephone Encounter (Signed)
Patient stated that [facility] [address]  has not received Rx  Please call pt [phone]

## 2017-03-08 NOTE — Telephone Encounter (Signed)
PA sent for Methocarbonal on cover my meds

## 2017-03-09 NOTE — Telephone Encounter (Signed)
[employer] requested additional information on this medication and has faxed over another form .  [phone]  Ref [phone]

## 2017-03-10 NOTE — Assessment & Plan Note (Signed)
Prior rotator cuff surgery by Dr Ernest PineHooten in 2006? Referral to Dr Joice LoftsPoggi in progress

## 2017-03-10 NOTE — Assessment & Plan Note (Addendum)
Severe,  AHI 64/hr  .CPAP TITRATION STUDY WITH SLEEP AID has been done,  And the order for cpap with a setting of 13 c H20 was sent to facility on March 5 . Patient states he has not received any communication or CPAP machine

## 2017-03-10 NOTE — Assessment & Plan Note (Addendum)
His A1c has improved from  7.0 3 months ago to 5.8 with carbohydrate reduction.  Continue asa and statin.  He has no proteinuria and has annual eye exams. He is due for the final Pneumovax vaccine.   Lab Results  Component Value Date   HGBA1C 5.8 03/07/2017   Lab Results  Component Value Date   MICROALBUR <0.7 09/01/2016

## 2017-03-10 NOTE — Assessment & Plan Note (Addendum)
Now with intermitted numbness occurring in the right hand. Unclear whether his current pain is radiculopathy from progressive spinal stenosis or due to  Unresolved MSK issues for right shoulder rotator cuff surgery or plain OA.  Given his need for chronic opioid use to manage pain , MRI cervical spine ordered. Refill history confirmed via [place] Controlled Substance databas, accessed by me today, and 3 months of oxycodone 15 mg  Tid  refills given. #90/month .

## 2017-03-10 NOTE — Assessment & Plan Note (Addendum)
Recent exacerbation resolved.  He has documented desaturations with exertion meeting criteria for supplemental oxygen,  Which was ordered in October 2017

## 2017-03-10 NOTE — Assessment & Plan Note (Signed)
Aggravated by venous insufficiency and use of aspirin .  Lab Results  Component Value Date   WBC 7.6 02/23/2016   HGB 12.6 (L) 02/23/2016   HCT 37.6 (L) 02/23/2016   MCV 85.9 02/23/2016   PLT 182.0 02/23/2016

## 2017-03-12 ENCOUNTER — Encounter: Payer: Self-pay | Admitting: Internal Medicine

## 2017-03-12 NOTE — Telephone Encounter (Signed)
Form given to CMA for PCP signature, thanks

## 2017-03-13 ENCOUNTER — Other Ambulatory Visit: Payer: Self-pay | Admitting: Internal Medicine

## 2017-03-13 DIAGNOSIS — J449 Chronic obstructive pulmonary disease, unspecified: Secondary | ICD-10-CM | POA: Diagnosis not present

## 2017-03-13 MED ORDER — TIZANIDINE HCL 4 MG PO TABS: 4.0000 mg | ORAL_TABLET | Freq: Four times a day (QID) | ORAL | 0 refills | Status: DC | PRN

## 2017-03-13 NOTE — Progress Notes (Unsigned)
[employer] has required us to change his his muscle relaxer from  methocarbamol to tizanidine , which I have done and sent in [facility] mail order

## 2017-03-13 NOTE — Progress Notes (Signed)
LMTCB Need to let pt know that his insurance will not cover methocarbamol and that Dr. [name] has sent in a rx for Tizanidine for the pt to try.

## 2017-03-14 NOTE — Telephone Encounter (Signed)
Pt called back returning a call.   Call tp @ [phone]

## 2017-03-15 NOTE — Progress Notes (Signed)
Spoke with pt and informed him that his insurance would not cover the methocarbamol that Dr. [name] had prescribed but that she did send in a different rx for tizanidine that his insurance will cover.

## 2017-03-20 DIAGNOSIS — M19011 Primary osteoarthritis, right shoulder: Secondary | ICD-10-CM | POA: Diagnosis not present

## 2017-03-20 DIAGNOSIS — M25511 Pain in right shoulder: Secondary | ICD-10-CM | POA: Diagnosis not present

## 2017-03-20 DIAGNOSIS — G8929 Other chronic pain: Secondary | ICD-10-CM | POA: Diagnosis not present

## 2017-03-21 ENCOUNTER — Telehealth: Payer: Self-pay | Admitting: *Deleted

## 2017-03-21 NOTE — Telephone Encounter (Signed)
[facility], in reference to the Rx for Tizanidine,stating that he will need a PA, however he received methocarbamol, and was okay with this Rx. Pt contact  [phone]

## 2017-03-22 NOTE — Telephone Encounter (Signed)
Spoke with pt and he stated that he has already received the methocarbamol and that he has been taking it and will not take the tizanidine.

## 2017-03-26 ENCOUNTER — Ambulatory Visit: Payer: Medicare HMO

## 2017-04-06 DIAGNOSIS — J449 Chronic obstructive pulmonary disease, unspecified: Secondary | ICD-10-CM | POA: Diagnosis not present

## 2017-04-18 ENCOUNTER — Other Ambulatory Visit: Payer: Self-pay | Admitting: Surgery

## 2017-04-18 DIAGNOSIS — M19011 Primary osteoarthritis, right shoulder: Secondary | ICD-10-CM | POA: Diagnosis not present

## 2017-05-02 ENCOUNTER — Ambulatory Visit: Payer: Medicare HMO

## 2017-05-03 ENCOUNTER — Other Ambulatory Visit: Payer: Medicare HMO

## 2017-05-06 DIAGNOSIS — J449 Chronic obstructive pulmonary disease, unspecified: Secondary | ICD-10-CM | POA: Diagnosis not present

## 2017-05-10 ENCOUNTER — Inpatient Hospital Stay: Admit: 2017-05-10 | Payer: Medicare HMO | Admitting: Surgery

## 2017-05-10 SURGERY — ARTHROPLASTY, SHOULDER, TOTAL
Anesthesia: Choice | Laterality: Right

## 2017-05-18 ENCOUNTER — Other Ambulatory Visit: Payer: Self-pay | Admitting: Internal Medicine

## 2017-05-22 ENCOUNTER — Other Ambulatory Visit: Payer: Self-pay | Admitting: Internal Medicine

## 2017-05-23 DIAGNOSIS — J449 Chronic obstructive pulmonary disease, unspecified: Secondary | ICD-10-CM | POA: Diagnosis not present

## 2017-06-06 DIAGNOSIS — J449 Chronic obstructive pulmonary disease, unspecified: Secondary | ICD-10-CM | POA: Diagnosis not present

## 2017-06-08 ENCOUNTER — Ambulatory Visit: Payer: Medicare HMO | Admitting: Internal Medicine

## 2017-06-12 ENCOUNTER — Encounter: Payer: Self-pay | Admitting: Internal Medicine

## 2017-06-12 ENCOUNTER — Ambulatory Visit (INDEPENDENT_AMBULATORY_CARE_PROVIDER_SITE_OTHER): Payer: Medicare HMO | Admitting: Internal Medicine

## 2017-06-12 VITALS — BP 140/80 | HR 73 | Temp 98.5°F | Resp 17 | Ht 71.5 in | Wt 297.6 lb

## 2017-06-12 DIAGNOSIS — E1159 Type 2 diabetes mellitus with other circulatory complications: Secondary | ICD-10-CM

## 2017-06-12 DIAGNOSIS — G8929 Other chronic pain: Secondary | ICD-10-CM | POA: Diagnosis not present

## 2017-06-12 DIAGNOSIS — M25552 Pain in left hip: Secondary | ICD-10-CM | POA: Diagnosis not present

## 2017-06-12 DIAGNOSIS — I10 Essential (primary) hypertension: Secondary | ICD-10-CM | POA: Diagnosis not present

## 2017-06-12 DIAGNOSIS — M25512 Pain in left shoulder: Secondary | ICD-10-CM | POA: Diagnosis not present

## 2017-06-12 DIAGNOSIS — G4733 Obstructive sleep apnea (adult) (pediatric): Secondary | ICD-10-CM

## 2017-06-12 DIAGNOSIS — R4189 Other symptoms and signs involving cognitive functions and awareness: Secondary | ICD-10-CM | POA: Diagnosis not present

## 2017-06-12 DIAGNOSIS — E782 Mixed hyperlipidemia: Secondary | ICD-10-CM

## 2017-06-12 LAB — COMPREHENSIVE METABOLIC PANEL
ALK PHOS: 60 U/L (ref 39–117)
ALT: 16 U/L (ref 0–53)
AST: 15 U/L (ref 0–37)
Albumin: 3.9 g/dL (ref 3.5–5.2)
BILIRUBIN TOTAL: 0.5 mg/dL (ref 0.2–1.2)
BUN: 21 mg/dL (ref 6–23)
CO2: 36 meq/L — AB (ref 19–32)
Calcium: 9.8 mg/dL (ref 8.4–10.5)
Chloride: 99 mEq/L (ref 96–112)
Creatinine, Ser: 1.01 mg/dL (ref 0.40–1.50)
GFR: 77.47 mL/min (ref >60.00)
GLUCOSE: 117 mg/dL — AB (ref 70–99)
POTASSIUM: 3.7 meq/L (ref 3.5–5.1)
Sodium: 142 mEq/L (ref 135–145)
TOTAL PROTEIN: 7.1 g/dL (ref 6.0–8.3)

## 2017-06-12 LAB — HEMOGLOBIN A1C: Hgb A1c MFr Bld: 6.4 % (ref 4.6–6.5)

## 2017-06-12 LAB — VITAMIN B12: Vitamin B-12: >1500 pg/mL — ABNORMAL HIGH (ref 211–911)

## 2017-06-12 LAB — LIPID PANEL
CHOLESTEROL: 151 mg/dL (ref 0–200)
HDL: 53.3 mg/dL (ref >39.00)
LDL CALC: 82 mg/dL (ref 0–99)
NonHDL: 97.24
Total CHOL/HDL Ratio: 3
Triglycerides: 77 mg/dL (ref 0.0–149.0)
VLDL: 15.4 mg/dL (ref 0.0–40.0)

## 2017-06-12 LAB — TSH: TSH: 0.62 u[IU]/mL (ref 0.35–4.50)

## 2017-06-12 MED ORDER — OXYCODONE HCL 15 MG PO TABS: 15.0000 mg | ORAL_TABLET | Freq: Three times a day (TID) | ORAL | 0 refills | Status: DC | PRN

## 2017-06-12 MED ORDER — OXYCODONE HCL 15 MG PO TABS
15.0000 mg | ORAL_TABLET | Freq: Three times a day (TID) | ORAL | 0 refills | Status: DC | PRN
Start: 2017-06-12 — End: 2017-06-12

## 2017-06-12 NOTE — Patient Instructions (Addendum)
I have refilled your oxycodone for 3 months,  And we will check labs today for cholesterol and diabetes   Please return before Sept 26 2018

## 2017-06-12 NOTE — Progress Notes (Signed)
Subjective:  Patient ID: Victor Daniels, male    DOB: [birth year]  Age: 71 y.o. MRN: [id number]  CC: The primary encounter diagnosis was Mixed hyperlipidemia. Diagnoses of Type 2 diabetes mellitus with other circulatory complication, without long-term current use of insulin (HCC), Cognitive change, Essential hypertension, Obstructive sleep apnea, Hip pain, left, and Chronic left shoulder pain were also pertinent to this visit.  HPI Victor Kempdward R Kritikos presents for follow up on chronic hip, back and shoulder pain managed with oxycodone,  Hypertension and type 2 DM  OSA:  Sleeping better with supplemental 02 at night 2 L /min by nasal cannula   Needs eye exam referral  At [place] eye overdue  Shoulder surgery in May by Poggi but was cancelled by patient  For personal reasons. Marland Kitchen.  He is using oxycodone, he limits himself to 3/day  Refill history confirmed via [place] Controlled Substance database, accessed by me today.. No refills or meds by other patients    Feels that his memory is starting to fail: names, details  But not getting lost .  Has stopped driving at night  Lab Results  Component Value Date   HGBA1C 6.4 06/12/2017     Outpatient Medications Prior to Visit  Medication Sig Dispense Refill  . albuterol (PROVENTIL) (2.5 MG/3ML) 0.083% nebulizer solution Take 3 mLs (2.5 mg total) by nebulization every 6 (six) hours as needed for wheezing or shortness of breath. 150 mL 1  . amLODipine (NORVASC) 5 MG tablet Take 1 tablet (5 mg total) by mouth daily. 90 tablet 3  . arformoterol (BROVANA) 15 MCG/2ML NEBU Take 2 mLs (15 mcg total) by nebulization 2 (two) times daily. DX: COPD  DX Code: J44.9 120 mL 5  . aspirin 81 MG tablet Take 81 mg by mouth daily.    . budesonide (PULMICORT) 0.25 MG/2ML nebulizer solution Take 2 mLs (0.25 mg total) by nebulization 2 (two) times daily. DX: COPD DX Code: J44.9 120 mL 5  . carvedilol (COREG) 12.5 MG tablet TAKE 1 TABLET TWICE DAILY WITH A MEAL 180  tablet 3  . docusate sodium (COLACE) 100 MG capsule Take 100 mg by mouth daily.    Marland Kitchen. ipratropium (ATROVENT) 0.03 % nasal spray PLACE 2 SPRAYS INTO BOTH NOSTRILS EVERY 12 (TWELVE) HOURS. 60 mL 12  . losartan-hydrochlorothiazide (HYZAAR) 100-25 MG tablet TAKE 1 TABLET EVERY DAY 90 tablet 3  . Multiple Vitamin (MULTIVITAMIN) capsule Take 1 capsule by mouth daily.    . nitroGLYCERIN (NITROSTAT) 0.4 MG SL tablet Place 1 tablet (0.4 mg total) under the tongue every 5 (five) minutes as needed for chest pain. Maximum dose 3 tablets 50 tablet 3  . omeprazole (PRILOSEC) 40 MG capsule Take 1 capsule (40 mg total) by mouth daily. 30 capsule 5  . simvastatin (ZOCOR) 20 MG tablet TAKE 1 TABLET AT BEDTIME 90 tablet 2  . SPIRIVA RESPIMAT 2.5 MCG/ACT AERS INHALE TWO SPRAY(S) BY MOUTH ONCE DAILY 4 g 3  . tiZANidine (ZANAFLEX) 4 MG tablet Take 1 tablet (4 mg total) by mouth every 6 (six) hours as needed for muscle spasms. 360 tablet 0  . VENTOLIN HFA 108 (90 Base) MCG/ACT inhaler INHALE TWO PUFFS BY MOUTH EVERY 6 HOURS AS NEEDED FOR WHEEZING OR SHORTNESS OF BREATH 18 each 5  . oxyCODONE (ROXICODONE) 15 MG immediate release tablet Take 1 tablet (15 mg total) by mouth every 8 (eight) hours as needed for pain. 90 tablet 0   No facility-administered medications prior to visit.  Review of Systems;  Patient denies headache, fevers, malaise, unintentional weight loss, skin rash, eye pain, sinus congestion and sinus pain, sore throat, dysphagia,  hemoptysis , cough, dyspnea, wheezing, chest pain, palpitations, orthopnea, edema, abdominal pain, nausea, melena, diarrhea, constipation, flank pain, dysuria, hematuria, urinary  Frequency, nocturia, numbness, tingling, seizures,  Focal weakness, Loss of consciousness,  Tremor, insomnia, depression, anxiety, and suicidal ideation.      Objective:  BP 140/80 (BP Location: Left Arm, Patient Position: Sitting, Cuff Size: Large)   Pulse 73   Temp 98.5 F (36.9 C) (Oral)    Resp 17   Ht 5' 11.5" (1.816 m)   Wt 297 lb 9.6 oz (135 kg)   SpO2 92%   BMI 40.93 kg/m   BP Readings from Last 3 Encounters:  06/12/17 140/80  03/07/17 102/60  02/23/17 136/72    Wt Readings from Last 3 Encounters:  06/12/17 297 lb 9.6 oz (135 kg)  03/07/17 283 lb 1.9 oz (128.4 kg)  02/23/17 297 lb 6.4 oz (134.9 kg)    General appearance: alert, cooperative and appears stated age Ears: normal TM's and external ear canals both ears Throat: lips, mucosa, and tongue normal; teeth and gums normal Neck: no adenopathy, no carotid bruit, supple, symmetrical, trachea midline and thyroid not enlarged, symmetric, no tenderness/mass/nodules Back: symmetric, no curvature. ROM normal. No CVA tenderness. Lungs: clear to auscultation bilaterally Heart: regular rate and rhythm, S1, S2 normal, no murmur, click, rub or gallop Abdomen: soft, non-tender; bowel sounds normal; no masses,  no organomegaly Pulses: 2+ and symmetric Skin: Skin color, texture, turgor normal. No rashes or lesions Lymph nodes: Cervical, supraclavicular, and axillary nodes normal.  Lab Results  Component Value Date   HGBA1C 6.4 06/12/2017   HGBA1C 5.8 03/07/2017   HGBA1C 7.0 (H) 12/01/2016    Lab Results  Component Value Date   CREATININE 1.01 06/12/2017   CREATININE 1.11 12/01/2016   CREATININE 1.32 09/01/2016    Lab Results  Component Value Date   WBC 7.6 02/23/2016   HGB 12.6 (L) 02/23/2016   HCT 37.6 (L) 02/23/2016   PLT 182.0 02/23/2016   GLUCOSE 117 (H) 06/12/2017   CHOL 151 06/12/2017   TRIG 77.0 06/12/2017   HDL 53.30 06/12/2017   LDLDIRECT 72.0 12/01/2016   LDLCALC 82 06/12/2017   ALT 16 06/12/2017   AST 15 06/12/2017   NA 142 06/12/2017   K 3.7 06/12/2017   CL 99 06/12/2017   CREATININE 1.01 06/12/2017   BUN 21 06/12/2017   CO2 36 (H) 06/12/2017   TSH 0.62 06/12/2017   INR 0.9 04/02/2015   HGBA1C 6.4 06/12/2017   MICROALBUR <0.7 09/01/2016    Dg Chest 2 View  Result Date:  05/19/2016 CLINICAL DATA:  Shortness of Breath EXAM: CHEST  2 VIEW COMPARISON:  04/02/2015 FINDINGS: Cardiac shadow is stable. The left lung remains clear. Scarring is noted in the right lung somewhat improved from the prior exam. No acute infiltrate is noted. No bony abnormality is noted. IMPRESSION: Chronic changes in the right lung base.  No acute abnormality noted. Electronically Signed   By: Alcide Clever M.D.   On: 05/19/2016 14:18    Assessment & Plan:   Problem List Items Addressed This Visit    Shoulder pain, left    He has deferred surgery due to lack of family support.  Refills on his narcotics were given.        Obstructive sleep apnea    Severe,  AHI 64/hr  .CPAP  TITRATION STUDY WITH SLEEP AID has been done,  And the order for cpap with a setting of 13 c H20 was sent to facility on March 5 . Patient states he is wearing supplemental oxygen at night and notes iproved sleep       Hypertension    Well controlled on current regimen. Renal function stable, no changes today.  Lab Results  Component Value Date   CREATININE 1.01 06/12/2017   Lab Results  Component Value Date   NA 142 06/12/2017   K 3.7 06/12/2017   CL 99 06/12/2017   CO2 36 (H) 06/12/2017         Hyperlipidemia - Primary   Relevant Orders   Lipid panel (Completed)   RESOLVED: Hip pain, left    Prior rotator cuff surgery by Dr Ernest Pine in 2006? Referral to Dr Joice Lofts in progress       Diabetes mellitus with circulatory complication (HCC)    His A1c has increased from 5.8 3 months ago to 6.5    Continue asa and statin.  He has no proteinuria and has annual eye exams. He has deferred the final Pneumovax vaccine.   Lab Results  Component Value Date   HGBA1C 6.4 06/12/2017   Lab Results  Component Value Date   MICROALBUR <0.7 09/01/2016         Relevant Orders   Comprehensive metabolic panel (Completed)   Hemoglobin A1c (Completed)   Ambulatory referral to Ophthalmology    Other Visit Diagnoses     Cognitive change       Relevant Orders   Vitamin B12 (Completed)   TSH (Completed)      I am having Mr. Stockinger maintain his aspirin, SPIRIVA RESPIMAT, nitroGLYCERIN, docusate sodium, multivitamin, omeprazole, amLODipine, budesonide, arformoterol, simvastatin, carvedilol, albuterol, ipratropium, tiZANidine, VENTOLIN HFA, losartan-hydrochlorothiazide, and oxyCODONE.  Meds ordered this encounter  Medications  . DISCONTD: oxyCODONE (ROXICODONE) 15 MG immediate release tablet    Sig: Take 1 tablet (15 mg total) by mouth every 8 (eight) hours as needed for pain.    Dispense:  90 tablet    Refill:  0    For refill on or after June 19 2017  . DISCONTD: oxyCODONE (ROXICODONE) 15 MG immediate release tablet    Sig: Take 1 tablet (15 mg total) by mouth every 8 (eight) hours as needed for pain.    Dispense:  90 tablet    Refill:  0    For refill on or after July 19 2017  . oxyCODONE (ROXICODONE) 15 MG immediate release tablet    Sig: Take 1 tablet (15 mg total) by mouth every 8 (eight) hours as needed for pain.    Dispense:  90 tablet    Refill:  0    For refill on or after August 19 2017   A total of 25 minutes of face to face time was spent with patient more than half of which was spent in counselling about the above mentioned conditions  and coordination of care    Medications Discontinued During This Encounter  Medication Reason  . oxyCODONE (ROXICODONE) 15 MG immediate release tablet Reorder  . oxyCODONE (ROXICODONE) 15 MG immediate release tablet Reorder  . oxyCODONE (ROXICODONE) 15 MG immediate release tablet Reorder    Follow-up: Return in about 3 months (around 09/12/2017).   [name], MD

## 2017-06-14 NOTE — Assessment & Plan Note (Signed)
Well controlled on current regimen. Renal function stable, no changes today.  Lab Results  Component Value Date   CREATININE 1.01 06/12/2017   Lab Results  Component Value Date   NA 142 06/12/2017   K 3.7 06/12/2017   CL 99 06/12/2017   CO2 36 (H) 06/12/2017

## 2017-06-14 NOTE — Assessment & Plan Note (Signed)
Prior rotator cuff surgery by Dr Ernest PineHooten in 2006? Referral to Dr Joice LoftsPoggi in progress

## 2017-06-14 NOTE — Assessment & Plan Note (Signed)
Severe,  AHI 64/hr  .CPAP TITRATION STUDY WITH SLEEP AID has been done,  And the order for cpap with a setting of 13 c H20 was sent to facility on March 5 . Patient states he is wearing supplemental oxygen at night and notes iproved sleep

## 2017-06-14 NOTE — Assessment & Plan Note (Addendum)
He has deferred surgery due to lack of family support.  Refills on his narcotics were given.

## 2017-06-14 NOTE — Assessment & Plan Note (Signed)
His A1c has increased from 5.8 3 months ago to 6.5    Continue asa and statin.  He has no proteinuria and has annual eye exams. He has deferred the final Pneumovax vaccine.   Lab Results  Component Value Date   HGBA1C 6.4 06/12/2017   Lab Results  Component Value Date   MICROALBUR <0.7 09/01/2016

## 2017-06-19 ENCOUNTER — Encounter: Payer: Self-pay | Admitting: Internal Medicine

## 2017-06-28 ENCOUNTER — Other Ambulatory Visit: Payer: Self-pay | Admitting: Internal Medicine

## 2017-06-29 ENCOUNTER — Other Ambulatory Visit: Payer: Self-pay

## 2017-06-29 MED ORDER — ALBUTEROL SULFATE (2.5 MG/3ML) 0.083% IN NEBU: INHALATION_SOLUTION | RESPIRATORY_TRACT | 1 refills | Status: DC

## 2017-07-03 ENCOUNTER — Other Ambulatory Visit: Payer: Self-pay | Admitting: Internal Medicine

## 2017-07-06 DIAGNOSIS — J449 Chronic obstructive pulmonary disease, unspecified: Secondary | ICD-10-CM | POA: Diagnosis not present

## 2017-07-18 DIAGNOSIS — J449 Chronic obstructive pulmonary disease, unspecified: Secondary | ICD-10-CM | POA: Diagnosis not present

## 2017-08-06 DIAGNOSIS — J449 Chronic obstructive pulmonary disease, unspecified: Secondary | ICD-10-CM | POA: Diagnosis not present

## 2017-08-14 DIAGNOSIS — E119 Type 2 diabetes mellitus without complications: Secondary | ICD-10-CM | POA: Diagnosis not present

## 2017-08-14 LAB — HM DIABETES EYE EXAM

## 2017-08-24 DIAGNOSIS — H2512 Age-related nuclear cataract, left eye: Secondary | ICD-10-CM | POA: Diagnosis not present

## 2017-08-29 DIAGNOSIS — J449 Chronic obstructive pulmonary disease, unspecified: Secondary | ICD-10-CM | POA: Diagnosis not present

## 2017-08-30 ENCOUNTER — Ambulatory Visit: Payer: Medicare HMO | Admitting: Anesthesiology

## 2017-08-30 ENCOUNTER — Encounter: Payer: Self-pay | Admitting: *Deleted

## 2017-08-30 ENCOUNTER — Encounter
Admission: RE | Disposition: A | Discharge status: Home / Self Care | Payer: Self-pay | Source: Ambulatory Visit | Attending: Ophthalmology

## 2017-08-30 ENCOUNTER — Ambulatory Visit
Admission: RE | Admit: 2017-08-30 | Discharge: 2017-08-30 | Disposition: A | Discharge status: Home / Self Care | Payer: Medicare HMO | Source: Ambulatory Visit | Attending: Ophthalmology | Admitting: Ophthalmology

## 2017-08-30 DIAGNOSIS — Z96641 Presence of right artificial hip joint: Secondary | ICD-10-CM | POA: Diagnosis not present

## 2017-08-30 DIAGNOSIS — Z8673 Personal history of transient ischemic attack (TIA), and cerebral infarction without residual deficits: Secondary | ICD-10-CM | POA: Diagnosis not present

## 2017-08-30 DIAGNOSIS — H2512 Age-related nuclear cataract, left eye: Secondary | ICD-10-CM | POA: Diagnosis not present

## 2017-08-30 DIAGNOSIS — I110 Hypertensive heart disease with heart failure: Secondary | ICD-10-CM | POA: Diagnosis not present

## 2017-08-30 DIAGNOSIS — I2510 Atherosclerotic heart disease of native coronary artery without angina pectoris: Secondary | ICD-10-CM | POA: Insufficient documentation

## 2017-08-30 DIAGNOSIS — E1151 Type 2 diabetes mellitus with diabetic peripheral angiopathy without gangrene: Secondary | ICD-10-CM | POA: Insufficient documentation

## 2017-08-30 DIAGNOSIS — I509 Heart failure, unspecified: Secondary | ICD-10-CM | POA: Insufficient documentation

## 2017-08-30 DIAGNOSIS — Z981 Arthrodesis status: Secondary | ICD-10-CM | POA: Diagnosis not present

## 2017-08-30 DIAGNOSIS — I5032 Chronic diastolic (congestive) heart failure: Secondary | ICD-10-CM | POA: Diagnosis not present

## 2017-08-30 DIAGNOSIS — G4730 Sleep apnea, unspecified: Secondary | ICD-10-CM | POA: Diagnosis not present

## 2017-08-30 DIAGNOSIS — M1990 Unspecified osteoarthritis, unspecified site: Secondary | ICD-10-CM | POA: Diagnosis not present

## 2017-08-30 DIAGNOSIS — J449 Chronic obstructive pulmonary disease, unspecified: Secondary | ICD-10-CM | POA: Diagnosis not present

## 2017-08-30 DIAGNOSIS — I252 Old myocardial infarction: Secondary | ICD-10-CM | POA: Insufficient documentation

## 2017-08-30 DIAGNOSIS — Z6839 Body mass index (BMI) 39.0-39.9, adult: Secondary | ICD-10-CM | POA: Diagnosis not present

## 2017-08-30 DIAGNOSIS — Z87891 Personal history of nicotine dependence: Secondary | ICD-10-CM | POA: Insufficient documentation

## 2017-08-30 DIAGNOSIS — E7800 Pure hypercholesterolemia, unspecified: Secondary | ICD-10-CM | POA: Insufficient documentation

## 2017-08-30 HISTORY — DX: Dependence on supplemental oxygen: Z99.81

## 2017-08-30 HISTORY — DX: Pneumonia, unspecified organism: J18.9

## 2017-08-30 HISTORY — DX: Cerebral infarction, unspecified: I63.9

## 2017-08-30 HISTORY — DX: Unspecified osteoarthritis, unspecified site: M19.90

## 2017-08-30 HISTORY — DX: Dyspnea, unspecified: R06.00

## 2017-08-30 HISTORY — DX: Type 2 diabetes mellitus without complications: E11.9

## 2017-08-30 HISTORY — PX: CATARACT EXTRACTION W/PHACO: SHX586

## 2017-08-30 LAB — GLUCOSE, CAPILLARY
GLUCOSE-CAPILLARY: 135 mg/dL — AB (ref 65–99)
Glucose-Capillary: 127 mg/dL — ABNORMAL HIGH (ref 65–99)

## 2017-08-30 SURGERY — PHACOEMULSIFICATION, CATARACT, WITH IOL INSERTION
Anesthesia: General | Site: Eye | Laterality: Left | Wound class: Clean

## 2017-08-30 MED ORDER — ARMC OPHTHALMIC DILATING DROPS
OPHTHALMIC | Status: AC
  Administered 2017-08-30: 1 via OPHTHALMIC
  Filled 2017-08-30: qty 0.4

## 2017-08-30 MED ORDER — POVIDONE-IODINE 5 % OP SOLN
OPHTHALMIC | Status: AC
Start: 2017-08-30 — End: ?
  Filled 2017-08-30: qty 30

## 2017-08-30 MED ORDER — PROPOFOL 10 MG/ML IV BOLUS
INTRAVENOUS | Status: DC | PRN
  Administered 2017-08-30: 50 mg via INTRAVENOUS

## 2017-08-30 MED ORDER — POVIDONE-IODINE 5 % OP SOLN
OPHTHALMIC | Status: AC
  Filled 2017-08-30: qty 30

## 2017-08-30 MED ORDER — MOXIFLOXACIN HCL 0.5 % OP SOLN
1.0000 [drp] | OPHTHALMIC | Status: DC | PRN
  Filled 2017-08-30: qty 3

## 2017-08-30 MED ORDER — ONDANSETRON HCL 4 MG/2ML IJ SOLN
INTRAMUSCULAR | Status: DC | PRN
  Administered 2017-08-30: 4 mg via INTRAVENOUS

## 2017-08-30 MED ORDER — SODIUM CHLORIDE 0.9 % IV SOLN
INTRAVENOUS | Status: DC
  Administered 2017-08-30 (×2): via INTRAVENOUS

## 2017-08-30 MED ORDER — FENTANYL CITRATE (PF) 100 MCG/2ML IJ SOLN: 25.0000 ug | INTRAMUSCULAR | Status: DC | PRN

## 2017-08-30 MED ORDER — SUCCINYLCHOLINE CHLORIDE 20 MG/ML IJ SOLN
INTRAMUSCULAR | Status: AC
  Filled 2017-08-30: qty 1

## 2017-08-30 MED ORDER — MIDAZOLAM HCL 2 MG/2ML IJ SOLN
INTRAMUSCULAR | Status: AC
Start: 2017-08-30 — End: ?
  Filled 2017-08-30: qty 2

## 2017-08-30 MED ORDER — ONDANSETRON HCL 4 MG/2ML IJ SOLN: 4.0000 mg | Freq: Once | INTRAMUSCULAR | Status: DC | PRN

## 2017-08-30 MED ORDER — MOXIFLOXACIN HCL 0.5 % OP SOLN
OPHTHALMIC | Status: AC
  Filled 2017-08-30: qty 3

## 2017-08-30 MED ORDER — LIDOCAINE HCL (PF) 2 % IJ SOLN
INTRAMUSCULAR | Status: AC
  Filled 2017-08-30: qty 2

## 2017-08-30 MED ORDER — GLYCOPYRROLATE 0.2 MG/ML IJ SOLN
INTRAMUSCULAR | Status: DC | PRN
  Administered 2017-08-30: 0.2 mg via INTRAVENOUS

## 2017-08-30 MED ORDER — LIDOCAINE HCL (PF) 4 % IJ SOLN
INTRAMUSCULAR | Status: AC
  Filled 2017-08-30: qty 5

## 2017-08-30 MED ORDER — NA CHONDROIT SULF-NA HYALURON 40-17 MG/ML IO SOLN
INTRAOCULAR | Status: AC
  Filled 2017-08-30: qty 1

## 2017-08-30 MED ORDER — MIDAZOLAM HCL 5 MG/5ML IJ SOLN
INTRAMUSCULAR | Status: DC | PRN
  Administered 2017-08-30 (×2): 1 mg via INTRAVENOUS

## 2017-08-30 MED ORDER — GLYCOPYRROLATE 0.2 MG/ML IJ SOLN
INTRAMUSCULAR | Status: AC
  Filled 2017-08-30: qty 1

## 2017-08-30 MED ORDER — EPINEPHRINE PF 1 MG/ML IJ SOLN
INTRAMUSCULAR | Status: AC
  Filled 2017-08-30: qty 1

## 2017-08-30 MED ORDER — DEXMEDETOMIDINE HCL IN NACL 200 MCG/50ML IV SOLN
INTRAVENOUS | Status: DC | PRN
  Administered 2017-08-30: 20 ug via INTRAVENOUS

## 2017-08-30 MED ORDER — PROPOFOL 10 MG/ML IV BOLUS
INTRAVENOUS | Status: AC
  Filled 2017-08-30: qty 20

## 2017-08-30 MED ORDER — FENTANYL CITRATE (PF) 100 MCG/2ML IJ SOLN
INTRAMUSCULAR | Status: DC | PRN
  Administered 2017-08-30 (×2): 50 ug via INTRAVENOUS

## 2017-08-30 MED ORDER — FENTANYL CITRATE (PF) 100 MCG/2ML IJ SOLN
INTRAMUSCULAR | Status: AC
  Filled 2017-08-30: qty 2

## 2017-08-30 MED ORDER — ARMC OPHTHALMIC DILATING DROPS
1.0000 "application " | OPHTHALMIC | Status: DC | PRN
  Administered 2017-08-30 (×3): 1 via OPHTHALMIC

## 2017-08-30 MED ORDER — DEXMEDETOMIDINE HCL IN NACL 200 MCG/50ML IV SOLN
INTRAVENOUS | Status: AC
Start: 2017-08-30 — End: ?
  Filled 2017-08-30: qty 50

## 2017-08-30 SURGICAL SUPPLY — 16 items
DISSECTOR HYDRO NUCLEUS 50X22 (MISCELLANEOUS) ×3 IMPLANT
GLOVE BIO SURGEON STRL SZ8 (GLOVE) ×6 IMPLANT
GLOVE BIOGEL M 6.5 STRL (GLOVE) ×3 IMPLANT
GLOVE SURG LX 7.5 STRW (GLOVE) ×4
GLOVE SURG LX STRL 7.5 STRW (GLOVE) ×2 IMPLANT
GOWN STRL REUS W/ TWL LRG LVL3 (GOWN DISPOSABLE) ×2 IMPLANT
GOWN STRL REUS W/TWL LRG LVL3 (GOWN DISPOSABLE) ×4
LABEL CATARACT MEDS ST (LABEL) ×3 IMPLANT
LENS IOL TECNIS ITEC 27.5 (Intraocular Lens) ×3 IMPLANT
PACK CATARACT (MISCELLANEOUS) ×6 IMPLANT
PACK CATARACT KING (MISCELLANEOUS) ×3 IMPLANT
PACK EYE AFTER SURG (MISCELLANEOUS) ×3 IMPLANT
SOL BSS BAG (MISCELLANEOUS) ×3
SOLUTION BSS BAG (MISCELLANEOUS) ×1 IMPLANT
WATER STERILE IRR 250ML POUR (IV SOLUTION) ×3 IMPLANT
WIPE NON LINTING 3.25X3.25 (MISCELLANEOUS) ×3 IMPLANT

## 2017-08-30 NOTE — Anesthesia Procedure Notes (Addendum)
Procedure Name: LMA Insertion Performed by: Malva CoganBEANE, Karem Tomaso Pre-anesthesia Checklist: Patient identified, Patient being monitored, Timeout performed, Emergency Drugs available and Suction available Patient Re-evaluated:Patient Re-evaluated prior to induction Oxygen Delivery Method: Circle system utilized Preoxygenation: Pre-oxygenation with 100% oxygen Induction Type: IV induction Ventilation: Mask ventilation without difficulty LMA: LMA inserted LMA Size: 5.0 Tube type: Oral Number of attempts: 1 Placement Confirmation: positive ETCO2 and breath sounds checked- equal and bilateral Tube secured with: Tape Dental Injury: Teeth and Oropharynx as per pre-operative assessment  Comments: Placed by Dr. Maisie Fushomas.

## 2017-08-30 NOTE — H&P (Signed)
The History and Physical notes are on paper, have been signed, and are to be scanned.   I have examined the patient and there are no changes to the H&P.   Willey BladeBradley Kethan Papadopoulos 08/30/2017 10:59 AM

## 2017-08-30 NOTE — Anesthesia Postprocedure Evaluation (Signed)
Anesthesia Post Note  Patient: Victor Daniels  Procedure(s) Performed: Procedure(s) (LRB): CATARACT EXTRACTION PHACO AND INTRAOCULAR LENS PLACEMENT (IOC) (Left)  Patient location during evaluation: PACU Anesthesia Type: General Level of consciousness: awake and alert Pain management: pain level controlled Vital Signs Assessment: post-procedure vital signs reviewed and stable Respiratory status: spontaneous breathing, nonlabored ventilation, respiratory function stable and patient connected to nasal cannula oxygen Cardiovascular status: blood pressure returned to baseline and stable Postop Assessment: no signs of nausea or vomiting Anesthetic complications: no     Last Vitals:  Vitals:   08/30/17 1251 08/30/17 1309  BP: 121/82 (!) 141/82  Pulse: 79 77  Resp: 16 16  Temp: 36.5 C (!) 36.2 C  SpO2: 100% 98%    Last Pain:  Vitals:   08/30/17 1309  TempSrc: Temporal                 Ariday Brinker S

## 2017-08-30 NOTE — Anesthesia Post-op Follow-up Note (Signed)
Anesthesia QCDR form completed.        

## 2017-08-30 NOTE — Anesthesia Preprocedure Evaluation (Signed)
Anesthesia Evaluation  Patient identified by MRN, date of birth, ID band Patient awake    Reviewed: Allergy & Precautions, NPO status , Patient's Chart, lab work & pertinent test results, reviewed documented beta blocker date and time   Airway Mallampati: III  TM Distance: >3 FB     Dental  (+) Chipped   Pulmonary shortness of breath, sleep apnea , pneumonia, resolved, COPD, former smoker,           Cardiovascular hypertension, Pt. on medications and Pt. on home beta blockers + CAD, + Past MI, + Peripheral Vascular Disease and +CHF       Neuro/Psych CVA    GI/Hepatic   Endo/Other  diabetes, Type 2Morbid obesity  Renal/GU      Musculoskeletal  (+) Arthritis ,   Abdominal   Peds  Hematology  (+) anemia ,   Anesthesia Other Findings   Reproductive/Obstetrics                             Anesthesia Physical Anesthesia Plan  ASA: III  Anesthesia Plan: MAC   Post-op Pain Management:    Induction:   PONV Risk Score and Plan:   Airway Management Planned:   Additional Equipment:   Intra-op Plan:   Post-operative Plan:   Informed Consent: I have reviewed the patients History and Physical, chart, labs and discussed the procedure including the risks, benefits and alternatives for the proposed anesthesia with the patient or authorized representative who has indicated his/her understanding and acceptance.     Plan Discussed with: CRNA  Anesthesia Plan Comments:         Anesthesia Quick Evaluation

## 2017-08-30 NOTE — Discharge Instructions (Signed)
Eye Surgery Discharge Instructions  Expect mild scratchy sensation or mild soreness. DO NOT RUB YOUR EYE!  The day of surgery:  Minimal physical activity, but bed rest is not required  No reading, computer work, or close hand work  No bending, lifting, or straining.  May watch TV  For 24 hours:  No driving, legal decisions, or alcoholic beverages  Safety precautions  Eat anything you prefer: It is better to start with liquids, then soup then solid foods.  _____ Eye patch should be worn until postoperative exam tomorrow.  ____ Solar shield eyeglasses should be worn for comfort in the sunlight/patch while sleeping  Resume all regular medications including aspirin or Coumadin if these were discontinued prior to surgery. You may shower, bathe, shave, or wash your hair. Tylenol may be taken for mild discomfort.  Call your doctor if you experience significant pain, nausea, or vomiting, fever > 101 or other signs of infection. [phone] or [phone] Specific instructions:  Follow-up Information    [place] CraneKing, Bradley Mark, MD Follow up.   Specialty:  Ophthalmology Why:  September 7 at 10:05am Contact information: [address] [place] [address] [phone]

## 2017-08-30 NOTE — Op Note (Signed)
OPERATIVE NOTE  SANDERS MANNINEN [id number] 08/30/2017   PREOPERATIVE DIAGNOSIS:  Nuclear sclerotic cataract left eye.  H25.12   POSTOPERATIVE DIAGNOSIS:    Nuclear sclerotic cataract left eye.     PROCEDURE:  Phacoemusification with posterior chamber intraocular lens placement of the left eye   LENS:   Implant Name Type Inv. Item Serial No. Manufacturer Lot No. LRB No. Used  LENS IOL DIOP 27.5 - [id number] Intraocular Lens LENS IOL DIOP 27.5 [id number] AMO   Left 1       PCB00 +27.5   ULTRASOUND TIME: 0 minutes 32.8 seconds.  CDE 4.41   SURGEON:  Benay Pillow, MD, MPH   ANESTHESIA:  General endotracheal.  Attempted under MAC, but patient experienced extreme back pain and discomfort when attempting to position supine.  ESTIMATED BLOOD LOSS: <1 mL   COMPLICATIONS:  None.   DESCRIPTION OF PROCEDURE:  The patient was identified in the holding room and transported to the operating room and placed in the supine position under the operating microscope.  The left eye was identified as the operative eye and it was prepped and draped in the usual sterile ophthalmic fashion.  The patient did not tolerate the position despite several rounds of sedatives, so the patient was undraped and general anesthesia was induced. The patient was reprepped and draped.    A 1.0 millimeter clear-corneal paracentesis was made at the 5:00 position. 0.5 ml of preservative-free 1% lidocaine with epinephrine was injected into the anterior chamber.  The anterior chamber was filled with Discovisc viscoelastic.  A 2.4 millimeter keratome was used to make a near-clear corneal incision at the 2:00 position.  A curvilinear capsulorrhexis was made with a cystotome and capsulorrhexis forceps.  Balanced salt solution was used to hydrodissect and hydrodelineate the nucleus.   Phacoemulsification was then used in stop and chop fashion to remove the lens nucleus and epinucleus.  The remaining cortex was then removed  using the irrigation and aspiration handpiece. Discovisc was then placed into the capsular bag to distend it for lens placement.  A lens was then injected into the capsular bag.  The remaining viscoelastic was aspirated.   Wounds were hydrated with balanced salt solution.  The anterior chamber was inflated to a physiologic pressure with balanced salt solution.  Intracameral vigamox 0.1 mL undiltued was injected into the eye and a drop placed onto the ocular surface.  No wound leaks were noted.  The patient was taken to the recovery room in stable condition without complications of anesthesia or surgery.    Benay Pillow 08/30/2017, 12:07 PM

## 2017-08-30 NOTE — Transfer of Care (Signed)
Immediate Anesthesia Transfer of Care Note  Patient: Victor KempEdward R Castanon  Procedure(s) Performed: Procedure(s) with comments: CATARACT EXTRACTION PHACO AND INTRAOCULAR LENS PLACEMENT (IOC) (Left) - Lot #[id number] H US:    00:32.8 AP%   13.5 CDE:   4.41  Patient Location: PACU  Anesthesia Type:General  Level of Consciousness: awake, oriented and patient cooperative  Airway & Oxygen Therapy: Patient Spontanous Breathing and Patient connected to nasal cannula oxygen  Post-op Assessment: Report given to RN, Post -op Vital signs reviewed and stable and Patient moving all extremities X 4  Post vital signs: Reviewed and stable  Last Vitals:  Vitals:   08/30/17 0927 08/30/17 1221  BP: 135/74 124/80  Pulse: 72 71  Resp: 16 17  Temp: 36.7 C (!) 36.4 C  SpO2: 98% 96%    Last Pain:  Vitals:   08/30/17 0927  TempSrc: Oral         Complications: No apparent anesthesia complications

## 2017-09-06 DIAGNOSIS — J449 Chronic obstructive pulmonary disease, unspecified: Secondary | ICD-10-CM | POA: Diagnosis not present

## 2017-09-12 ENCOUNTER — Ambulatory Visit (INDEPENDENT_AMBULATORY_CARE_PROVIDER_SITE_OTHER): Payer: Medicare HMO | Admitting: Internal Medicine

## 2017-09-12 ENCOUNTER — Encounter: Payer: Self-pay | Admitting: Internal Medicine

## 2017-09-12 VITALS — BP 132/72 | HR 73 | Temp 98.3°F | Resp 15 | Ht 72.0 in | Wt 301.8 lb

## 2017-09-12 DIAGNOSIS — Z23 Encounter for immunization: Secondary | ICD-10-CM | POA: Diagnosis not present

## 2017-09-12 DIAGNOSIS — M4722 Other spondylosis with radiculopathy, cervical region: Secondary | ICD-10-CM

## 2017-09-12 DIAGNOSIS — G4733 Obstructive sleep apnea (adult) (pediatric): Secondary | ICD-10-CM

## 2017-09-12 DIAGNOSIS — E782 Mixed hyperlipidemia: Secondary | ICD-10-CM

## 2017-09-12 DIAGNOSIS — E1159 Type 2 diabetes mellitus with other circulatory complications: Secondary | ICD-10-CM

## 2017-09-12 DIAGNOSIS — I872 Venous insufficiency (chronic) (peripheral): Secondary | ICD-10-CM

## 2017-09-12 LAB — COMPREHENSIVE METABOLIC PANEL WITH GFR
ALT: 17 U/L (ref 0–53)
AST: 18 U/L (ref 0–37)
Albumin: 3.7 g/dL (ref 3.5–5.2)
Alkaline Phosphatase: 65 U/L (ref 39–117)
BUN: 20 mg/dL (ref 6–23)
CO2: 39 meq/L — ABNORMAL HIGH (ref 19–32)
Calcium: 9.5 mg/dL (ref 8.4–10.5)
Chloride: 96 meq/L (ref 96–112)
Creatinine, Ser: 1.06 mg/dL (ref 0.40–1.50)
GFR: 73.22 mL/min
Glucose, Bld: 117 mg/dL — ABNORMAL HIGH (ref 70–99)
Potassium: 3.6 meq/L (ref 3.5–5.1)
Sodium: 141 meq/L (ref 135–145)
Total Bilirubin: 0.5 mg/dL (ref 0.2–1.2)
Total Protein: 6.8 g/dL (ref 6.0–8.3)

## 2017-09-12 LAB — HEMOGLOBIN A1C: Hgb A1c MFr Bld: 6.4 % (ref 4.6–6.5)

## 2017-09-12 MED ORDER — FUROSEMIDE 20 MG PO TABS: 20.0000 mg | ORAL_TABLET | Freq: Every day | ORAL | 3 refills | Status: DC | PRN

## 2017-09-12 MED ORDER — OXYCODONE HCL 15 MG PO TABS: 15.0000 mg | ORAL_TABLET | Freq: Three times a day (TID) | ORAL | 0 refills | Status: DC | PRN

## 2017-09-12 MED ORDER — OXYCODONE HCL 15 MG PO TABS
15.0000 mg | ORAL_TABLET | Freq: Three times a day (TID) | ORAL | 0 refills | Status: DC | PRN
Start: 2017-09-12 — End: 2017-12-12

## 2017-09-12 NOTE — Patient Instructions (Addendum)
Keep watching your sugar intake  We will check your A1c today   As long as your A1c is < 7.0,   you will not need medications for your diabetes    I have refilled your oxycodone for 3 months.  I'll see you in December

## 2017-09-12 NOTE — Progress Notes (Signed)
Subjective:  Patient ID: Victor Daniels, male    DOB: 15-Nov-1946  Age: 71 y.o. MRN: [id number]  CC: The primary encounter diagnosis was Type 2 diabetes mellitus with other circulatory complication, without long-term current use of insulin (HCC). Diagnoses of Encounter for immunization, Venous insufficiency of both lower extremities, Obstructive sleep apnea, Mixed hyperlipidemia, and Cervical radiculopathy due to degenerative joint disease of spine were also pertinent to this visit.  HPI [name] presents for follow up  On multiple conditions including Type 2 Diabetes mellitus,  Diet controlled,  COPD,  CAD with diastoic dysfunction, severe OSA and chronic radiculopathic neck pain secondary to cervical spine stenosis with prior cervical vertebral fusion (2006) , managed with opioids due to multiple medical comorbidities making chronic use of NSAIDS C/I.   Has gained 4 lbs since June  Had Cataract surgery sept 6 left side,  Vision not improved due to lazy eye.  But brightness is better.    Holding off on the right side   Breathing ok,   Using the nebulizer twice daily with pulmicort  and Brovana  Each time  .   Has resumed daily lasix  and potassium  For lower extremity edema    Requesting refill on oxycodone .  Takes 3 daily  On bad days, some days only at night    Takes 2 a  tnight to sleep because of joint pain.  Refill history confirmed via [place] Controlled Substance databas, accessed by me today..   Lab Results  Component Value Date   CHOL 151 06/12/2017   HDL 53.30 06/12/2017   LDLCALC 82 06/12/2017   LDLDIRECT 72.0 12/01/2016   TRIG 77.0 06/12/2017   CHOLHDL 3 06/12/2017            Outpatient Medications Prior to Visit  Medication Sig Dispense Refill  . albuterol (PROVENTIL) (2.5 MG/3ML) 0.083% nebulizer solution USE ONE VIAL IN NEBULIZER EVERY 6 HOURS AS NEEDED FOR WHEEZING OR SHORTNESS OF BREATH 1 vial 1  . amLODipine (NORVASC) 5 MG tablet TAKE 1  TABLET EVERY DAY 90 tablet 3  . arformoterol (BROVANA) 15 MCG/2ML NEBU Take 2 mLs (15 mcg total) by nebulization 2 (two) times daily. DX: COPD  DX Code: J44.9 120 mL 5  . aspirin 81 MG tablet Take 81 mg by mouth daily.    . budesonide (PULMICORT) 0.25 MG/2ML nebulizer solution Take 2 mLs (0.25 mg total) by nebulization 2 (two) times daily. DX: COPD DX Code: J44.9 120 mL 5  . carvedilol (COREG) 12.5 MG tablet TAKE 1 TABLET TWICE DAILY WITH A MEAL 180 tablet 3  . docusate sodium (COLACE) 100 MG capsule Take 100 mg by mouth daily.    Marland Kitchen ipratropium (ATROVENT) 0.03 % nasal spray PLACE 2 SPRAYS INTO BOTH NOSTRILS EVERY 12 (TWELVE) HOURS. 60 mL 12  . losartan-hydrochlorothiazide (HYZAAR) 100-25 MG tablet TAKE 1 TABLET EVERY DAY 90 tablet 3  . Multiple Vitamin (MULTIVITAMIN) capsule Take 1 capsule by mouth daily.    . nitroGLYCERIN (NITROSTAT) 0.4 MG SL tablet Place 1 tablet (0.4 mg total) under the tongue every 5 (five) minutes as needed for chest pain. Maximum dose 3 tablets 50 tablet 3  . omeprazole (PRILOSEC) 40 MG capsule Take 1 capsule (40 mg total) by mouth daily. (Patient not taking: Reported on 08/30/2017) 30 capsule 5  . simvastatin (ZOCOR) 20 MG tablet TAKE 1 TABLET AT BEDTIME 90 tablet 2  . SPIRIVA RESPIMAT 2.5 MCG/ACT AERS INHALE TWO SPRAY(S) BY MOUTH ONCE DAILY (  Patient not taking: Reported on 08/30/2017) 4 g 3  . tiZANidine (ZANAFLEX) 4 MG tablet Take 1 tablet (4 mg total) by mouth every 6 (six) hours as needed for muscle spasms. 360 tablet 0  . VENTOLIN HFA 108 (90 Base) MCG/ACT inhaler INHALE TWO PUFFS BY MOUTH EVERY 6 HOURS AS NEEDED FOR WHEEZING OR SHORTNESS OF BREATH 18 each 5  . oxyCODONE (ROXICODONE) 15 MG immediate release tablet Take 1 tablet (15 mg total) by mouth every 8 (eight) hours as needed for pain. 90 tablet 0   No facility-administered medications prior to visit.     Review of Systems;  Patient denies headache, fevers, malaise, unintentional weight loss, skin rash, eye  pain, sinus congestion and sinus pain, sore throat, dysphagia,  hemoptysis , cough, dyspnea, wheezing, chest pain, palpitations, orthopnea, edema, abdominal pain, nausea, melena, diarrhea, constipation, flank pain, dysuria, hematuria, urinary  Frequency, nocturia, numbness, tingling, seizures,  Focal weakness, Loss of consciousness,  Tremor, insomnia, depression, anxiety, and suicidal ideation.      Objective:  BP 132/72 (BP Location: Left Arm, Patient Position: Sitting, Cuff Size: Large)   Pulse 73   Temp 98.3 F (36.8 C) (Oral)   Resp 15   Ht 6' (1.829 m)   Wt (!) 301 lb 12.8 oz (136.9 kg)   SpO2 (!) 87% Comment: left O2 in car, wanted to see O2 level w/o it  BMI 40.93 kg/m   BP Readings from Last 3 Encounters:  09/12/17 132/72  08/30/17 (!) 141/82  06/12/17 140/80    Wt Readings from Last 3 Encounters:  09/12/17 (!) 301 lb 12.8 oz (136.9 kg)  08/30/17 290 lb (131.5 kg)  06/12/17 297 lb 9.6 oz (135 kg)    General appearance: alert, cooperative and appears stated age Ears: normal TM's and external ear canals both ears Throat: lips, mucosa, and tongue normal; teeth and gums normal Neck: no adenopathy, no carotid bruit, supple, symmetrical, trachea midline and thyroid not enlarged, symmetric, no tenderness/mass/nodules Back: symmetric, no curvature. ROM normal. No CVA tenderness. Lungs: clear to auscultation bilaterally Heart: regular rate and rhythm, S1, S2 normal, no murmur, click, rub or gallop Abdomen: soft, non-tender; bowel sounds normal; no masses,  no organomegaly Pulses: 2+ and symmetric Skin: Skin color, texture, turgor normal. No rashes or lesions Lymph nodes: Cervical, supraclavicular, and axillary nodes normal.  Lab Results  Component Value Date   HGBA1C 6.4 09/12/2017   HGBA1C 6.4 06/12/2017   HGBA1C 5.8 03/07/2017    Lab Results  Component Value Date   CREATININE 1.06 09/12/2017   CREATININE 1.01 06/12/2017   CREATININE 1.11 12/01/2016    Lab  Results  Component Value Date   WBC 7.6 02/23/2016   HGB 12.6 (L) 02/23/2016   HCT 37.6 (L) 02/23/2016   PLT 182.0 02/23/2016   GLUCOSE 117 (H) 09/12/2017   CHOL 151 06/12/2017   TRIG 77.0 06/12/2017   HDL 53.30 06/12/2017   LDLDIRECT 72.0 12/01/2016   LDLCALC 82 06/12/2017   ALT 17 09/12/2017   AST 18 09/12/2017   NA 141 09/12/2017   K 3.6 09/12/2017   CL 96 09/12/2017   CREATININE 1.06 09/12/2017   BUN 20 09/12/2017   CO2 39 (H) 09/12/2017   TSH 0.62 06/12/2017   INR 0.9 04/02/2015   HGBA1C 6.4 09/12/2017   MICROALBUR <0.7 09/01/2016    No results found.  Assessment & Plan:   Problem List Items Addressed This Visit    Cervical radiculopathy due to degenerative joint disease of  spine    Now with intermittent pain and  numbness occurring in the right hand. Unclear whether his current pain is radiculopathy from progressive spinal stenosis or due to  Unresolved MSK issues for right shoulder rotator cuff surgery or plain OA.   Refill history confirmed via [place] Controlled Substance database, accessed by me today, and 3 months of oxycodone 15 mg  Tid  refills given. #90/month .      Diabetes mellitus with circulatory complication (HCC) - Primary    His A1c has been steadily increasing from pre diabetes to diabetes diagnosis with a1c of 6.5 an dis now 6.4.     Continue asa and statin.  He has no proteinuria and has annual eye exams. He has deferred the final Pneumovax vaccine.   Lab Results  Component Value Date   HGBA1C 6.4 09/12/2017   Lab Results  Component Value Date   MICROALBUR <0.7 09/01/2016         Relevant Orders   Hemoglobin A1c (Completed)   Comprehensive metabolic panel (Completed)   Hyperlipidemia    Managed with simvastatin  Lab Results  Component Value Date   CHOL 151 06/12/2017   HDL 53.30 06/12/2017   LDLCALC 82 06/12/2017   LDLDIRECT 72.0 12/01/2016   TRIG 77.0 06/12/2017   CHOLHDL 3 06/12/2017  managed with simvastatin.  LDL is 82 and LFTs  normal no changes .  Lab Results  Component Value Date   CHOL 151 06/12/2017   HDL 53.30 06/12/2017   LDLCALC 82 06/12/2017   LDLDIRECT 72.0 12/01/2016   TRIG 77.0 06/12/2017   CHOLHDL 3 06/12/2017   Lab Results  Component Value Date   ALT 17 09/12/2017   AST 18 09/12/2017   ALKPHOS 65 09/12/2017   BILITOT 0.5 09/12/2017         Relevant Medications   furosemide (LASIX) 20 MG tablet   Obstructive sleep apnea    Severe,  AHI 64/hr  .CPAP TITRATION STUDY WITH SLEEP AID has been done,  And the order for cpap with a setting of 13 c H20 was sent to facility on March 5 . Patient states he is wearing supplemental oxygen at night and notes improved sleep       Venous insufficiency of leg    Complicated by pitting edema secondary to diastolic dysfunction and untreated OSA.  He is now using a daily regimen of lasix/potassium .  Lab Results  Component Value Date   CREATININE 1.06 09/12/2017   Lab Results  Component Value Date   NA 141 09/12/2017   K 3.6 09/12/2017   CL 96 09/12/2017   CO2 39 (H) 09/12/2017         Relevant Medications   furosemide (LASIX) 20 MG tablet    Other Visit Diagnoses    Encounter for immunization       Relevant Orders   Flu vaccine HIGH DOSE PF (Completed)      I am having Mr. Brigante maintain his aspirin, SPIRIVA RESPIMAT, nitroGLYCERIN, docusate sodium, multivitamin, omeprazole, budesonide, arformoterol, carvedilol, ipratropium, tiZANidine, VENTOLIN HFA, losartan-hydrochlorothiazide, albuterol, simvastatin, amLODipine, furosemide, and oxyCODONE.  Meds ordered this encounter  Medications  . furosemide (LASIX) 20 MG tablet    Sig: Take 1 tablet (20 mg total) by mouth daily as needed.    Dispense:  90 tablet    Refill:  3  . DISCONTD: oxyCODONE (ROXICODONE) 15 MG immediate release tablet    Sig: Take 1 tablet (15 mg total) by mouth every 8 (eight)  hours as needed for pain.    Dispense:  90 tablet    Refill:  0    For refill on or  after September 19 2017  . DISCONTD: oxyCODONE (ROXICODONE) 15 MG immediate release tablet    Sig: Take 1 tablet (15 mg total) by mouth every 8 (eight) hours as needed for pain.    Dispense:  90 tablet    Refill:  0    For refill on or after October 19 2017  . oxyCODONE (ROXICODONE) 15 MG immediate release tablet    Sig: Take 1 tablet (15 mg total) by mouth every 8 (eight) hours as needed for pain.    Dispense:  90 tablet    Refill:  0    For refill on or after November 19 2017   A total of 25 minutes of face to face time was spent with patient more than half of which was spent in counselling about the above mentioned conditions  and coordination of care  Medications Discontinued During This Encounter  Medication Reason  . oxyCODONE (ROXICODONE) 15 MG immediate release tablet Reorder  . oxyCODONE (ROXICODONE) 15 MG immediate release tablet Reorder  . oxyCODONE (ROXICODONE) 15 MG immediate release tablet Reorder    Follow-up: Return in about 3 months (around 12/12/2017), or if symptoms worsen or fail to improve, for follow up diabetes.   [name], MD

## 2017-09-15 NOTE — Assessment & Plan Note (Signed)
Complicated by pitting edema secondary to diastolic dysfunction and untreated OSA.  He is now using a daily regimen of lasix/potassium .  Lab Results  Component Value Date   CREATININE 1.06 09/12/2017   Lab Results  Component Value Date   NA 141 09/12/2017   K 3.6 09/12/2017   CL 96 09/12/2017   CO2 39 (H) 09/12/2017

## 2017-09-15 NOTE — Assessment & Plan Note (Signed)
Managed with simvastatin  Lab Results  Component Value Date   CHOL 151 06/12/2017   HDL 53.30 06/12/2017   LDLCALC 82 06/12/2017   LDLDIRECT 72.0 12/01/2016   TRIG 77.0 06/12/2017   CHOLHDL 3 06/12/2017  managed with simvastatin.  LDL is 82 and LFTs normal no changes .  Lab Results  Component Value Date   CHOL 151 06/12/2017   HDL 53.30 06/12/2017   LDLCALC 82 06/12/2017   LDLDIRECT 72.0 12/01/2016   TRIG 77.0 06/12/2017   CHOLHDL 3 06/12/2017   Lab Results  Component Value Date   ALT 17 09/12/2017   AST 18 09/12/2017   ALKPHOS 65 09/12/2017   BILITOT 0.5 09/12/2017

## 2017-09-15 NOTE — Assessment & Plan Note (Signed)
His A1c has been steadily increasing from pre diabetes to diabetes diagnosis with a1c of 6.5 an dis now 6.4.     Continue asa and statin.  He has no proteinuria and has annual eye exams. He has deferred the final Pneumovax vaccine.   Lab Results  Component Value Date   HGBA1C 6.4 09/12/2017   Lab Results  Component Value Date   MICROALBUR <0.7 09/01/2016

## 2017-09-15 NOTE — Assessment & Plan Note (Addendum)
Now with intermittent pain and  numbness occurring in the right hand. Unclear whether his current pain is radiculopathy from progressive spinal stenosis or due to  Unresolved MSK issues for right shoulder rotator cuff surgery or plain OA.   Refill history confirmed via [place] Controlled Substance database, accessed by me today, and 3 months of oxycodone 15 mg  Tid  refills given. #90/month . 

## 2017-09-15 NOTE — Assessment & Plan Note (Signed)
Severe,  AHI 64/hr  .CPAP TITRATION STUDY WITH SLEEP AID has been done,  And the order for cpap with a setting of 13 c H20 was sent to facility on March 5 . Patient states he is wearing supplemental oxygen at night and notes improved sleep

## 2017-09-19 NOTE — Telephone Encounter (Signed)
See care note  

## 2017-09-27 ENCOUNTER — Ambulatory Visit: Admit: 2017-09-27 | Payer: Medicare HMO | Admitting: Ophthalmology

## 2017-09-27 SURGERY — PHACOEMULSIFICATION, CATARACT, WITH IOL INSERTION
Anesthesia: Choice | Laterality: Right

## 2017-10-03 ENCOUNTER — Telehealth: Payer: Self-pay | Admitting: Internal Medicine

## 2017-10-03 MED ORDER — DOCUSATE SODIUM 100 MG PO CAPS: 200.0000 mg | ORAL_CAPSULE | Freq: Every day | ORAL | 2 refills | Status: DC

## 2017-10-03 NOTE — Telephone Encounter (Signed)
Please advise 

## 2017-10-03 NOTE — Telephone Encounter (Signed)
Spoke with pt and informed him that the stool softener was sent in.

## 2017-10-03 NOTE — Telephone Encounter (Signed)
docusate.  In chart !  Refilled

## 2017-10-03 NOTE — Telephone Encounter (Signed)
Pt called about wanting to get another Rx for a stool softer pt had it before he does not know what it was called. Please advise?  Call pt @ [phone]. Thank you!

## 2017-10-06 DIAGNOSIS — J449 Chronic obstructive pulmonary disease, unspecified: Secondary | ICD-10-CM | POA: Diagnosis not present

## 2017-10-17 ENCOUNTER — Ambulatory Visit
Admission: RE | Admit: 2017-10-17 | Discharge: 2017-10-17 | Disposition: A | Discharge status: Home / Self Care | Payer: Medicare HMO | Source: Ambulatory Visit | Attending: Internal Medicine | Admitting: Internal Medicine

## 2017-10-17 ENCOUNTER — Other Ambulatory Visit: Payer: Self-pay | Admitting: Internal Medicine

## 2017-10-17 ENCOUNTER — Encounter: Payer: Medicare HMO | Attending: Internal Medicine | Admitting: Internal Medicine

## 2017-10-17 DIAGNOSIS — M7732 Calcaneal spur, left foot: Secondary | ICD-10-CM | POA: Diagnosis not present

## 2017-10-17 DIAGNOSIS — E1142 Type 2 diabetes mellitus with diabetic polyneuropathy: Secondary | ICD-10-CM | POA: Insufficient documentation

## 2017-10-17 DIAGNOSIS — Z9981 Dependence on supplemental oxygen: Secondary | ICD-10-CM | POA: Diagnosis not present

## 2017-10-17 DIAGNOSIS — X58XXXA Exposure to other specified factors, initial encounter: Secondary | ICD-10-CM | POA: Diagnosis not present

## 2017-10-17 DIAGNOSIS — J449 Chronic obstructive pulmonary disease, unspecified: Secondary | ICD-10-CM | POA: Insufficient documentation

## 2017-10-17 DIAGNOSIS — S81802A Unspecified open wound, left lower leg, initial encounter: Secondary | ICD-10-CM | POA: Diagnosis not present

## 2017-10-17 DIAGNOSIS — S91102A Unspecified open wound of left great toe without damage to nail, initial encounter: Secondary | ICD-10-CM | POA: Diagnosis not present

## 2017-10-17 DIAGNOSIS — I2510 Atherosclerotic heart disease of native coronary artery without angina pectoris: Secondary | ICD-10-CM | POA: Insufficient documentation

## 2017-10-17 DIAGNOSIS — I10 Essential (primary) hypertension: Secondary | ICD-10-CM | POA: Diagnosis not present

## 2017-10-17 DIAGNOSIS — E11621 Type 2 diabetes mellitus with foot ulcer: Secondary | ICD-10-CM | POA: Diagnosis not present

## 2017-10-17 DIAGNOSIS — L97521 Non-pressure chronic ulcer of other part of left foot limited to breakdown of skin: Secondary | ICD-10-CM | POA: Insufficient documentation

## 2017-10-17 DIAGNOSIS — Z87891 Personal history of nicotine dependence: Secondary | ICD-10-CM | POA: Insufficient documentation

## 2017-10-17 DIAGNOSIS — M1990 Unspecified osteoarthritis, unspecified site: Secondary | ICD-10-CM | POA: Insufficient documentation

## 2017-10-19 NOTE — Progress Notes (Signed)
Victor, Daniels ([id number]) Visit Report for 10/17/2017 Chief Complaint Document Details Patient Name: Victor Daniels, Victor Daniels. Date of Service: 10/17/2017 10:30 AM Medical Record Number: [id number] Patient Account Number: [id number] Date of Birth/Sex: [birth year] (71 y.o. Male) Treating RN: Phillis Haggis Primary Care Provider: Duncan Dull Other Clinician: Referring Provider: Duncan Dull Treating Provider/Extender: Altamese [place] in Treatment: 0 Information Obtained from: Patient Chief Complaint Patient has bilateral venous stasis with varicosities and post probable thrombosis with hemosiderosis of both of his legs he has 3 ulcers on the posterior aspect of his left leg. 09/05/16; patient is here for review of 3 wounds on the lateral aspect of his left leg. He also states that there is weeping fluid out of his bilateral legs for the last month 10/17/17; patient is here for review of a wound on the plantar left great toe Electronic Signature(s) Signed: 10/17/2017 5:28:55 PM By: Baltazar Najjar MD Entered By: Baltazar Najjar on 10/17/2017 12:33:04 Victor Daniels ([id number]) -------------------------------------------------------------------------------- Debridement Details Patient Name: Victor Daniels. Date of Service: 10/17/2017 10:30 AM Medical Record Number: [id number] Patient Account Number: [id number] Date of Birth/Sex: 1946/09/06 (71 y.o. Male) Treating RN: Phillis Haggis Primary Care Provider: Duncan Dull Other Clinician: Referring Provider: Duncan Dull Treating Provider/Extender: Maxwell Caul [place] in Treatment: 0 Debridement Performed for Wound #6 Plantar Toe Great Assessment: Performed By: Physician Maxwell Caul, MD Debridement: Debridement Pre-procedure Verification/Time Yes - 11:37 Out Taken: Start Time: 11:38 Pain Control: Lidocaine 4% Topical Solution Level: Skin/Subcutaneous Tissue Total Area Debrided (L x W):  1.6 (cm) x 3.3 (cm) = 5.28 (cm) Tissue and other material Viable, Non-Viable, Callus, Exudate, Fibrin/Slough, Subcutaneous debrided: Instrument: Curette Bleeding: Moderate Hemostasis Achieved: Silver Nitrate End Time: 11:41 Procedural Pain: 0 Post Procedural Pain: 0 Response to Treatment: Procedure was tolerated well Post Debridement Measurements of Total Wound Length: (cm) 1.6 Width: (cm) 3.4 Depth: (cm) 0.4 Volume: (cm) 1.709 Character of Wound/Ulcer Post Debridement: Requires Further Debridement Post Procedure Diagnosis Same as Pre-procedure Electronic Signature(s) Signed: 10/17/2017 5:08:19 PM By: Alejandro Mulling Signed: 10/17/2017 5:28:55 PM By: Baltazar Najjar MD Entered By: Baltazar Najjar on 10/17/2017 12:32:40 Victor Daniels ([id number]) -------------------------------------------------------------------------------- HPI Details Patient Name: Victor Daniels. Date of Service: 10/17/2017 10:30 AM Medical Record Number: [id number] Patient Account Number: [id number] Date of Birth/Sex: May 10, 1946 (71 y.o. Male) Treating RN: Phillis Haggis Primary Care Provider: Duncan Dull Other Clinician: Referring Provider: Duncan Dull Treating Provider/Extender: Altamese [place] in Treatment: 0 History of Present Illness Location: Patient has bilateral venous stasis with ulcerations with inflammation of the left leg Severity: The wounds are very clean and had been improving and measured smaller with silver collagen Duration: The patient has had problems with venous ulcers bilaterally from time to time for the last couple of years HPI Description: He returns today in followup of his left lower extremity open wound secondary to chronic venous insufficiency and ulceration. He removed his wraps yesterday as he was going to see his primary care physician. His notice some increased drainage from the wound and his primary care doctor increased his diuretics. He  has no fevers or chills. He does have compression garments but he does not wear them because her too hard to get on. He is looking into obtaining some with [employer] sides that may be easier for him to put on. In the meantime he would like to continue with his compression wraps. READMISSION 09/05/16; this patient is a 71 year old man who was in this clinic on 2 visits  2 years ago in 2015. At that point he had wounds on his left leg felt to be secondary to chronic venous insufficiency with bilateral hemosiderin deposition. He ultimately was discharged with graded pressure stockings. The patient states he has stockings and is reasonably compliant with them we didn't come in with him on today. Is apparently a borderline diabetic not currently on any treatment undergoing nonpharmacologic therapy with diet etc. The patient states he is actually gained weight however. We month ago the patient started to notice more edema in both legs. The skin cracked open and he has had 3 draining areas on the left leg. He states that both legs drain so that night he'll often wake up with his lower pajama legs wet. He has not felt systemically unwell. He does have some shortness of breath but no chest pain. He tells me that he went to see Dr. dew of vascular surgery and had some tests done in his legs. He was told that nothing further could be done surgically for this. I don't have these notes order these tests at this point. His ABIs in this clinic were noncompressible at 1.57 on the left and 1.53 on the right 09/12/16; the patient had to take his wraps off on Sunday when he got the dressings wet. Nevertheless all his wound areas have closed there is no open area. He has severe bilateral venous insufficiency and has recently been to see Dr. dew Y haven't actually seen these records. Brought in the stockings he has these are 15-20 mmHg, this will be insufficient for him he will need at least 20-30 Hg perhaps 30-40s. We  have given the address of elastic therapy and [place], with the measurements we should be able to get hematocrit compression by mail READMISSION 10//24/18; this is a 71 year old man who is a type II diabetic but is not on any current therapy other than diet. He has COPD on chronic oxygen a current nonsmoker. He tells me that about a month ago he was walking through a gait and managed to traumatize the plantar aspect of his left great toe. He developed a wound on the plantar aspect of the toe at the extreme base of the toe. He is been using topical antibiotics. We have seen him before in this clinic but predominantly related to chronic venous insufficiency wounds. He has seen Dr. dew in the past but I don't have any of those records at my fingertips. He has known noncompressible ABIs bilaterally. Electronic Signature(s) Signed: 10/17/2017 5:28:55 PM By: Baltazar Najjar MD Entered By: Baltazar Najjar on 10/17/2017 12:36:34 Victor Daniels ([id number]) -------------------------------------------------------------------------------- Physical Exam Details Patient Name: BLAYKE, CORDREY. Date of Service: 10/17/2017 10:30 AM Medical Record Number: [id number] Patient Account Number: [id number] Date of Birth/Sex: 04/17/1946 (71 y.o. Male) Treating RN: Phillis Haggis Primary Care Provider: Duncan Dull Other Clinician: Referring Provider: Duncan Dull Treating Provider/Extender: Maxwell Caul [place] in Treatment: 0 Constitutional Patient is hypertensive.. Pulse regular and within target range for patient.Marland Kitchen Respirations regular, non-labored and within target range.. Temperature is normal and within the target range for the patient.Marland Kitchen appears in no distress. Eyes Conjunctivae clear. No discharge. Respiratory The patient has oxygen on however his work of breathing at rest seems normal. Decreased air entry bilaterally with expiratory wheezing. Cardiovascular Heart rhythm and rate  regular, without murmur or gallop.. Femoral pulses are palpable. R Salas pedis pulses are palpable but not the posterior tibial. Edema present in both extremities. however the edema is reasonably well-controlled. Lymphatic  None palpable in the popliteal or inguinal area. Musculoskeletal There is no frank tenderness over the left interphalangeal joint or the left MTP I did not appreciate any effusion. Integumentary (Hair, Skin) No rash. Severe hemosiderin deposition bilaterally. Neurological Decreased light touch and vibration sense in both feet. Psychiatric No evidence of depression, anxiety, or agitation. Calm, cooperative, and communicative. Appropriate interactions and affect.. Notes Wound exam; the patient's entire left great toe was swollen. There is no point tenderness over the interphalangeal or metatarsal-phalangeal joint. The wound itself is at the plantar base of the left first to surrounding callus. Using a number 5 curret callous and surrounding subcutaneous tissue removed from around the wouind bed. Hemostasis with direct pressure Electronic Signature(s) Signed: 10/17/2017 5:28:55 PM By: Baltazar Najjar MD Entered By: Baltazar Najjar on 10/17/2017 12:40:19 Victor Daniels ([id number]) -------------------------------------------------------------------------------- Physician Orders Details Patient Name: GERSON, FAUTH. Date of Service: 10/17/2017 10:30 AM Medical Record Number: [id number] Patient Account Number: [id number] Date of Birth/Sex: [birth year] (71 y.o. Male) Treating RN: Phillis Haggis Primary Care Provider: Duncan Dull Other Clinician: Referring Provider: Duncan Dull Treating Provider/Extender: Altamese [place] in Treatment: 0 Verbal / Phone Orders: Yes Clinician: Pinkerton, Debi Read Back and Verified: Yes Diagnosis Coding Wound Cleansing Wound #6 Plantar Toe Great o Clean wound with Normal Saline. o Cleanse wound with mild  soap and water o May shower with protection. Anesthetic Wound #6 Plantar Toe Great o Topical Lidocaine 4% cream applied to wound bed prior to debridement Primary Wound Dressing Wound #6 Plantar Toe Great o Silvercel Non-Adherent Secondary Dressing Wound #6 Plantar Toe Great o Dry Gauze o Kerlix and Coban o Foam o Other - plastic tape Dressing Change Frequency Wound #6 Plantar Toe Great o Change dressing every other day. Follow-up Appointments Wound #6 Plantar Toe Great o Return Appointment in 1 week. Edema Control Wound #6 Plantar Toe Great o Elevate legs to the level of the heart and pump ankles as often as possible Off-Loading Wound #6 Plantar Toe Great o Open toe surgical shoe with peg assist. Additional Orders / Instructions Wound #6 Plantar Toe Great o Increase protein intake. JONATHYN, CAROTHERS ([id number]) Radiology o X-ray, foot - left Services and Therapies o Arterial Studies- Unilateral - left Electronic Signature(s) Signed: 10/17/2017 5:08:19 PM By: Alejandro Mulling Signed: 10/17/2017 5:28:55 PM By: Baltazar Najjar MD Entered By: Alejandro Mulling on 10/17/2017 13:14:43 Victor Daniels ([id number]) -------------------------------------------------------------------------------- Problem List Details Patient Name: CLAIRE, BRIDGE. Date of Service: 10/17/2017 10:30 AM Medical Record Number: [id number] Patient Account Number: [id number] Date of Birth/Sex: 1946/09/27 (71 y.o. Male) Treating RN: Phillis Haggis Primary Care Provider: Duncan Dull Other Clinician: Referring Provider: Duncan Dull Treating Provider/Extender: Altamese [place] in Treatment: 0 Active Problems ICD-10 Encounter Code Description Active Date Diagnosis L97.521 Non-pressure chronic ulcer of other part of left foot limited to 10/17/2017 Yes breakdown of skin E11.621 Type 2 diabetes mellitus with foot ulcer 10/17/2017 Yes E11.42  Type 2 diabetes mellitus with diabetic polyneuropathy 10/17/2017 Yes Inactive Problems Resolved Problems Electronic Signature(s) Signed: 10/17/2017 5:28:55 PM By: Baltazar Najjar MD Entered By: Baltazar Najjar on 10/17/2017 12:32:18 Victor Daniels ([id number]) -------------------------------------------------------------------------------- Progress Note Details Patient Name: Victor Daniels. Date of Service: 10/17/2017 10:30 AM Medical Record Number: [id number] Patient Account Number: [id number] Date of Birth/Sex: September 29, 1946 (71 y.o. Male) Treating RN: Phillis Haggis Primary Care Provider: Duncan Dull Other Clinician: Referring Provider: Duncan Dull Treating Provider/Extender: Altamese [place] in Treatment: 0 Subjective Chief Complaint Information obtained from Patient Patient  has bilateral venous stasis with varicosities and post probable thrombosis with hemosiderosis of both of his legs he has 3 ulcers on the posterior aspect of his left leg. 09/05/16; patient is here for review of 3 wounds on the lateral aspect of his left leg. He also states that there is weeping fluid out of his bilateral legs for the last month 10/17/17; patient is here for review of a wound on the plantar left great toe History of Present Illness (HPI) The following HPI elements were documented for the patient's wound: Location: Patient has bilateral venous stasis with ulcerations with inflammation of the left leg Severity: The wounds are very clean and had been improving and measured smaller with silver collagen Duration: The patient has had problems with venous ulcers bilaterally from time to time for the last couple of years He returns today in followup of his left lower extremity open wound secondary to chronic venous insufficiency and ulceration. He removed his wraps yesterday as he was going to see his primary care physician. His notice some increased drainage from the wound and  his primary care doctor increased his diuretics. He has no fevers or chills. He does have compression garments but he does not wear them because her too hard to get on. He is looking into obtaining some with [employer] sides that may be easier for him to put on. In the meantime he would like to continue with his compression wraps. READMISSION 09/05/16; this patient is a 71 year old man who was in this clinic on 2 visits 2 years ago in 2015. At that point he had wounds on his left leg felt to be secondary to chronic venous insufficiency with bilateral hemosiderin deposition. He ultimately was discharged with graded pressure stockings. The patient states he has stockings and is reasonably compliant with them we didn't come in with him on today. Is apparently a borderline diabetic not currently on any treatment undergoing nonpharmacologic therapy with diet etc. The patient states he is actually gained weight however. We month ago the patient started to notice more edema in both legs. The skin cracked open and he has had 3 draining areas on the left leg. He states that both legs drain so that night he'll often wake up with his lower pajama legs wet. He has not felt systemically unwell. He does have some shortness of breath but no chest pain. He tells me that he went to see Dr. dew of vascular surgery and had some tests done in his legs. He was told that nothing further could be done surgically for this. I don't have these notes order these tests at this point. His ABIs in this clinic were noncompressible at 1.57 on the left and 1.53 on the right 09/12/16; the patient had to take his wraps off on Sunday when he got the dressings wet. Nevertheless all his wound areas have closed there is no open area. He has severe bilateral venous insufficiency and has recently been to see Dr. dew Y haven't actually seen these records. Brought in the stockings he has these are 15-20 mmHg, this will be insufficient for him  he will need at least 20-30 Hg perhaps 30-40s. We have given the address of elastic therapy and [place], with the measurements we should be able to get hematocrit compression by mail READMISSION 10//24/18; this is a 71 year old man who is a type II diabetic but is not on any current therapy other than diet. He has COPD on chronic oxygen a current nonsmoker.  He tells me that about a month ago he was walking through a gait and managed to traumatize the plantar aspect of his left great toe. He developed a wound on the plantar aspect of the toe at the extreme base of the toe. He is been using topical antibiotics. We have seen him before in this clinic but predominantly related to chronic venous insufficiency wounds. He has seen Dr. dew in the past but I don't have any of those records at my fingertips. He has OLUWANIFEMI, SUSMAN. ([id number]) known noncompressible ABIs bilaterally. Wound History Patient presents with 1 open wound that has been present for approximately 1 month. Patient has been treating wound in the following manner: abx oint. Laboratory tests have not been performed in the last month. Patient reportedly has not tested positive for an antibiotic resistant organism. Patient reportedly has not tested positive for osteomyelitis. Patient reportedly has had testing performed to evaluate circulation in the legs. Patient experiences the following problems associated with their wounds: swelling. Patient History Information obtained from Patient. Allergies No Known Drug Allergies Family History Heart Disease - Father,Mother, Hypertension - Father,Mother, Stroke - SELF, Thyroid Problems - Mother, No family history of Cancer, Diabetes, Hereditary Spherocytosis, Kidney Disease, Lung Disease, Seizures, Tuberculosis. Social History Former smoker, Marital Status - Married, Alcohol Use - Never - quit, Drug Use - No History, Caffeine Use - Daily - coffee, coke. Medical History Eyes Patient  has history of Cataracts - removed Medical And Surgical History Notes Respiratory pneumonectomy of right lung Genitourinary have only one kidney Review of Systems (ROS) Eyes Complains or has symptoms of Glasses / Contacts. Respiratory O2 depend Objective Constitutional Patient is hypertensive.. Pulse regular and within target range for patient.Marland Kitchen Respirations regular, non-labored and within target range.. Temperature is normal and within the target range for the patient.Marland Kitchen appears in no distress. Vitals Time Taken: 11:13 AM, Height: 72 in, Source: Stated, Weight: 295 lbs, Source: Stated, BMI: 40, Temperature: 98.7 F, Pulse: 77 bpm, Respiratory Rate: 20 breaths/min, Blood Pressure: 165/89 mmHg. JOSHIAH, TRAYNHAM R. ([id number]) Eyes Conjunctivae clear. No discharge. Respiratory The patient has oxygen on however his work of breathing at rest seems normal. Decreased air entry bilaterally with expiratory wheezing. Cardiovascular Heart rhythm and rate regular, without murmur or gallop.. Femoral pulses are palpable. R Salas pedis pulses are palpable but not the posterior tibial. Edema present in both extremities. however the edema is reasonably well-controlled. Lymphatic None palpable in the popliteal or inguinal area. Musculoskeletal There is no frank tenderness over the left interphalangeal joint or the left MTP I did not appreciate any effusion. Neurological Decreased light touch and vibration sense in both feet. Psychiatric No evidence of depression, anxiety, or agitation. Calm, cooperative, and communicative. Appropriate interactions and affect.. General Notes: Wound exam; the patient's entire left great toe was swollen. There is no point tenderness over the interphalangeal or metatarsal-phalangeal joint. The wound itself is at the plantar base of the left first to surrounding callus. Using a number 5 curret callous and surrounding subcutaneous tissue removed from around the  wouind bed. Hemostasis with direct pressure Integumentary (Hair, Skin) No rash. Severe hemosiderin deposition bilaterally. Wound #6 status is Open. Original cause of wound was Trauma. The wound is located on the Plantar Toe Great. The wound measures 1.6cm length x 3.3cm width x 0.5cm depth; 4.147cm^2 area and 2.073cm^3 volume. There is no tunneling noted, however, there is undermining starting at 12:00 and ending at 12:00 with a maximum distance of 0.2cm. There is a  large amount of serosanguineous drainage noted. The wound margin is distinct with the outline attached to the wound base. There is small (1-33%) pink granulation within the wound bed. There is a large (67-100%) amount of necrotic tissue within the wound bed including Eschar and Adherent Slough. The periwound skin appearance exhibited: Callus, Erythema. The surrounding wound skin color is noted with erythema which is circumferential. Periwound temperature was noted as No Abnormality. The periwound has tenderness on palpation. Assessment Active Problems ICD-10 L97.521 - Non-pressure chronic ulcer of other part of left foot limited to breakdown of skin E11.621 - Type 2 diabetes mellitus with foot ulcer E11.42 - Type 2 diabetes mellitus with diabetic polyneuropathy KASHTEN, GOWIN ([id number]) Procedures Wound #6 Pre-procedure diagnosis of Wound #6 is a Trauma, Other located on the Plantar Toe Great . There was a Skin/Subcutaneous Tissue Debridement ([id number]) debridement with total area of 5.28 sq cm performed by Maxwell Caul, MD. with the following instrument(s): Curette to remove Viable and Non-Viable tissue/material including Exudate, Fibrin/Slough, Callus, and Subcutaneous after achieving pain control using Lidocaine 4% Topical Solution. A time out was conducted at 11:37, prior to the start of the procedure. A Moderate amount of bleeding was controlled with Silver Nitrate. The procedure was tolerated well with a  pain level of 0 throughout and a pain level of 0 following the procedure. Post Debridement Measurements: 1.6cm length x 3.4cm width x 0.4cm depth; 1.709cm^3 volume. Character of Wound/Ulcer Post Debridement requires further debridement. Post procedure Diagnosis Wound #6: Same as Pre-Procedure Plan Wound Cleansing: Wound #6 Plantar Toe Great: Clean wound with Normal Saline. Cleanse wound with mild soap and water May shower with protection. Anesthetic: Wound #6 Plantar Toe Great: Topical Lidocaine 4% cream applied to wound bed prior to debridement Primary Wound Dressing: Wound #6 Plantar Toe Great: Silvercel Non-Adherent Secondary Dressing: Wound #6 Plantar Toe Great: Dry Gauze Foam Other - plastic tape Dressing Change Frequency: Wound #6 Plantar Toe Great: Change dressing every other day. Follow-up Appointments: Wound #6 Plantar Toe Great: Return Appointment in 1 week. Edema Control: Wound #6 Plantar Toe Great: Elevate legs to the level of the heart and pump ankles as often as possible Off-Loading: Wound #6 Plantar Toe Great: Open toe surgical shoe with peg assist. Additional Orders / Instructions: Wound #6 Plantar Toe Great: Increase protein intake. Radiology ordered were: X-ray, foot - left Services and Therapies ordered were: Arterial Studies- Unilateral - left CLEBURN, MAIOLO ([id number]) #1 silver alginate on the wound/gauze/felt offloading/healing sandal. He'll need to change this himself at home he tells me his wife and him don't have a good relationship enough that she is likely to do this for him #2 he needs an x-ray of this toe which is swollen quite significantly although the cause of this is not clear. He is not on a lot of pain but he is reasonably insensate in this area. Acutely I need to rule out a fracture #3 the patient is going to need arterial evaluation. He is seen Dr. dew in the past #4 COPD on chronic oxygen. This seems quite severe but at  least at rest he is asymptomatic Electronic Signature(s) Signed: 10/17/2017 5:28:55 PM By: Baltazar Najjar MD Entered By: Baltazar Najjar on 10/17/2017 12:42:40 Victor Daniels ([id number]) -------------------------------------------------------------------------------- ROS/PFSH Details Patient Name: LILTON, PARE. Date of Service: 10/17/2017 10:30 AM Medical Record Number: [id number] Patient Account Number: [id number] Date of Birth/Sex: 1946/11/30 (71 y.o. Male) Treating RN: Phillis Haggis Primary Care Provider: Duncan Dull Other Clinician: Referring  Provider: Duncan Dull Treating Provider/Extender: Altamese [place] in Treatment: 0 Information Obtained From Patient Wound History Do you currently have one or more open woundso Yes How many open wounds do you currently haveo 1 Approximately how long have you had your woundso 1 month How have you been treating your wound(s) until nowo abx oint Has your wound(s) ever healed and then re-openedo No Have you had any lab work done in the past montho No Have you tested positive for an antibiotic resistant organism (MRSA, VRE)o No Have you tested positive for osteomyelitis (bone infection)o No Have you had any tests for circulation on your legso Yes Where was the test doneo [place] 6 yrs ago Have you had other problems associated with your woundso Swelling Eyes Complaints and Symptoms: Positive for: Glasses / Contacts Medical History: Positive for: Cataracts - removed Hematologic/Lymphatic Medical History: Positive for: Anemia Respiratory Complaints and Symptoms: Review of System Notes: O2 depend Medical History: Positive for: Asthma; Chronic Obstructive Pulmonary Disease (COPD) Negative for: Sleep Apnea Past Medical History Notes: pneumonectomy of right lung Cardiovascular Medical History: Positive for: Arrhythmia; Coronary Artery Disease; Hypertension Endocrine Medical History: Positive for:  Type II Diabetes HARLAN, ERVINE ([id number]) Treated with: Diet Genitourinary Medical History: Past Medical History Notes: have only one kidney Musculoskeletal Medical History: Positive for: Osteoarthritis Oncologic Medical History: Negative for: Received Chemotherapy; Received Radiation HBO Extended History Items Eyes: Cataracts Immunizations Pneumococcal Vaccine: Received Pneumococcal Vaccination: Yes Immunization Notes: up to date Implantable Devices Family and Social History Cancer: No; Diabetes: No; Heart Disease: Yes - Father,Mother; Hereditary Spherocytosis: No; Hypertension: Yes - Father,Mother; Kidney Disease: No; Lung Disease: No; Seizures: No; Stroke: Yes - SELF; Thyroid Problems: Yes - Mother; Tuberculosis: No; Former smoker; Marital Status - Married; Alcohol Use: Never - quit; Drug Use: No History; Caffeine Use: Daily - coffee, coke; Financial Concerns: No; Food, Clothing or Shelter Needs: No; Support System Lacking: No; Transportation Concerns: No; Advanced Directives: No; Patient does not want information on Advanced Directives; Living Will: No Electronic Signature(s) Signed: 10/17/2017 5:08:19 PM By: Alejandro Mulling Signed: 10/17/2017 5:28:55 PM By: Baltazar Najjar MD Entered By: Alejandro Mulling on 10/17/2017 11:16:56 Victor Daniels ([id number]) -------------------------------------------------------------------------------- SuperBill Details Patient Name: TEIGAN, SAHLI. Date of Service: 10/17/2017 Medical Record Number: [id number] Patient Account Number: [id number] Date of Birth/Sex: 15-May-1946 (71 y.o. Male) Treating RN: Phillis Haggis Primary Care Provider: Duncan Dull Other Clinician: Referring Provider: Duncan Dull Treating Provider/Extender: Maxwell Caul Weeks in Treatment: 0 Diagnosis Coding ICD-10 Codes Code Description 7250236775 Non-pressure chronic ulcer of other part of left foot limited to breakdown of  skin E11.621 Type 2 diabetes mellitus with foot ulcer E11.42 Type 2 diabetes mellitus with diabetic polyneuropathy Facility Procedures CPT4 Code Description: 46962952 99213 - WOUND CARE VISIT-LEV 3 EST PT Modifier: Quantity: 1 CPT4 Code Description: 84132440 11042 - DEB SUBQ TISSUE 20 SQ CM/< ICD-10 Diagnosis Description L97.521 Non-pressure chronic ulcer of other part of left foot limited to E11.621 Type 2 diabetes mellitus with foot ulcer Modifier: breakdown of s Quantity: 1 kin Physician Procedures CPT4 Code Description: 1027253 99214 - WC PHYS LEVEL 4 - EST PT ICD-10 Diagnosis Description L97.521 Non-pressure chronic ulcer of other part of left foot limited t E11.621 Type 2 diabetes mellitus with foot ulcer Modifier: 25 o breakdown of sk Quantity: 1 in CPT4 Code Description: 6644034 11042 - WC PHYS SUBQ TISS 20 SQ CM ICD-10 Diagnosis Description L97.521 Non-pressure chronic ulcer of other part of left foot limited t E11.621 Type 2  diabetes mellitus with foot ulcer Modifier: o breakdown of sk Quantity: 1 in Electronic Signature(s) Signed: 10/17/2017 1:13:04 PM By: Alejandro MullingPinkerton, Debra Signed: 10/17/2017 5:28:55 PM By: Baltazar Najjarobson, Bobbe Quilter MD Entered By: Alejandro MullingPinkerton, Debra on 10/17/2017 13:13:04

## 2017-10-19 NOTE — Progress Notes (Signed)
Victor Daniels, Victor R. ([id number]) Visit Report for 10/17/2017 Allergy List Details Patient Name: Victor Daniels, Victor R. Date of Service: 10/17/2017 10:30 AM Medical Record Number: [id number] Patient Account Number: [id number] Date of Birth/Sex: 19-Jul-1946 [age] y.o. Male) Treating RN: Phillis HaggisPinkerton, Debi Primary Care Omelia Marquart: Duncan Dullullo, Teresa Other Clinician: Referring Almin Livingstone: Duncan Dullullo, Teresa Treating Jasmine Mcbeth/Extender: Maxwell CaulOBSON, MICHAEL G Weeks in Treatment: 0 Allergies Active Allergies No Known Drug Allergies Allergy Notes Electronic Signature(s) Signed: 10/17/2017 5:08:19 PM By: Alejandro MullingPinkerton, Debra Entered By: Alejandro MullingPinkerton, Debra on 10/17/2017 11:14:03 Victor Daniels, Victor R. ([id number]) -------------------------------------------------------------------------------- Arrival Information Details Patient Name: Victor Daniels, Victor R. Date of Service: 10/17/2017 10:30 AM Medical Record Number: [id number] Patient Account Number: [id number] Date of Birth/Sex: 19-Jul-1946 [age] y.o. Male) Treating RN: Phillis HaggisPinkerton, Debi Primary Care Averleigh Savary: Duncan Dullullo, Teresa Other Clinician: Referring Doren Kaspar: Duncan Dullullo, Teresa Treating Colton Tassin/Extender: Altamese CarolinaOBSON, MICHAEL G Weeks in Treatment: 0 Visit Information Patient Arrived: Ambulatory Arrival Time: 11:12 Accompanied By: self Transfer Assistance: None Patient Identification Verified: Yes Secondary Verification Process Completed: Yes Patient Requires Transmission-Based No Precautions: Patient Has Alerts: No History Since Last Visit All ordered tests and consults were completed: No Added or deleted any medications: No Any new allergies or adverse reactions: No Had a fall or experienced change in activities of daily living that may affect risk of falls: No Signs or symptoms of abuse/neglect since last visito No Hospitalized since last visit: No Has Dressing in Place as Prescribed: Yes Electronic Signature(s) Signed: 10/17/2017 5:08:19 PM By: Alejandro MullingPinkerton,  Debra Entered By: Alejandro MullingPinkerton, Debra on 10/17/2017 11:13:38 Victor Daniels, Victor R. ([id number]) -------------------------------------------------------------------------------- Clinic Level of Care Assessment Details Patient Name: Victor Daniels, Victor R. Date of Service: 10/17/2017 10:30 AM Medical Record Number: [id number] Patient Account Number: [id number] Date of Birth/Sex: 19-Jul-1946 [age] y.o. Male) Treating RN: Phillis HaggisPinkerton, Debi Primary Care Owens Hara: Duncan Dullullo, Teresa Other Clinician: Referring Valeria Krisko: Duncan Dullullo, Teresa Treating Bijal Siglin/Extender: Altamese CarolinaOBSON, MICHAEL G Weeks in Treatment: 0 Clinic Level of Care Assessment Items TOOL 1 Quantity Score X - Use when EandM and Procedure is performed on INITIAL visit 1 0 ASSESSMENTS - Nursing Assessment / Reassessment X - General Physical Exam (combine w/ comprehensive assessment (listed just below) when 1 20 performed on new pt. evals) X- 1 25 Comprehensive Assessment (HX, ROS, Risk Assessments, Wounds Hx, etc.) ASSESSMENTS - Wound and Skin Assessment / Reassessment []  - Dermatologic / Skin Assessment (not related to wound area) 0 ASSESSMENTS - Ostomy and/or Continence Assessment and Care []  - Incontinence Assessment and Management 0 []  - 0 Ostomy Care Assessment and Management (repouching, etc.) PROCESS - Coordination of Care []  - Simple Patient / Family Education for ongoing care 0 X- 1 20 Complex (extensive) Patient / Family Education for ongoing care X- 1 10 Staff obtains ChiropractorConsents, Records, Test Results / Process Orders []  - 0 Staff telephones HHA, Nursing Homes / Clarify orders / etc []  - 0 Routine Transfer to another Facility (non-emergent condition) []  - 0 Routine Hospital Admission (non-emergent condition) X- 1 15 New Admissions / Manufacturing engineernsurance Authorizations / Ordering NPWT, Apligraf, etc. []  - 0 Emergency Hospital Admission (emergent condition) PROCESS - Special Needs []  - Pediatric / Minor Patient Management 0 []  - 0 Isolation  Patient Management []  - 0 Hearing / Language / Visual special needs []  - 0 Assessment of Community assistance (transportation, D/C planning, etc.) []  - 0 Additional assistance / Altered mentation []  - 0 Support Surface(s) Assessment (bed, cushion, seat, etc.) Victor Daniels, Victor R. ([id number]) INTERVENTIONS - Miscellaneous []  - External ear exam 0 []  - 0 Patient Transfer (multiple staff / Nurse, adultHoyer Lift / Similar  devices) []  - 0 Simple Staple / Suture removal (25 or less) []  - 0 Complex Staple / Suture removal (26 or more) []  - 0 Hypo/Hyperglycemic Management (do not check if billed separately) X- 1 15 Ankle / Brachial Index (ABI) - do not check if billed separately Has the patient been seen at the hospital within the last three years: Yes Total Score: 105 Level Of Care: New/Established - Level 3 Electronic Signature(s) Signed: 10/17/2017 5:08:19 PM By: Alejandro Mulling Entered By: Alejandro Mulling on 10/17/2017 13:12:55 Victor Daniels ([id number]) -------------------------------------------------------------------------------- Encounter Discharge Information Details Patient Name: Victor Daniels, [name] Date of Service: 10/17/2017 10:30 AM Medical Record Number: [id number] Patient Account Number: [id number] Date of Birth/Sex: Aug 08, 1946 (71 y.o. Male) Treating RN: Phillis Haggis Primary Care Marlia Schewe: Duncan Dull Other Clinician: Referring Bernon Arviso: Duncan Dull Treating Lysle Yero/Extender: Altamese [place] in Treatment: 0 Encounter Discharge Information Items Discharge Pain Level: 0 Discharge Condition: Stable Ambulatory Status: Ambulatory Discharge Destination: Home Private Transportation: Auto Accompanied By: self Schedule Follow-up Appointment: Yes Medication Reconciliation completed and provided No to Patient/Care Sherra Kimmons: Clinical Summary of Care: Electronic Signature(s) Signed: 10/17/2017 1:09:27 PM By: Alejandro Mulling Entered By:  Alejandro Mulling on 10/17/2017 13:09:27 Victor Daniels ([id number]) -------------------------------------------------------------------------------- Lower Extremity Assessment Details Patient Name: Victor Daniels. Date of Service: 10/17/2017 10:30 AM Medical Record Number: [id number] Patient Account Number: [id number] Date of Birth/Sex: 1946/08/05 (71 y.o. Male) Treating RN: Phillis Haggis Primary Care Chaylee Ehrsam: Duncan Dull Other Clinician: Referring Josefa [place]: Duncan Dull Treating Rudransh Bellanca/Extender: Altamese [place] in Treatment: 0 Vascular Assessment Pulses: Dorsalis Pedis Palpable: [Left:Yes] Doppler Audible: [Left:Yes] Posterior Tibial Palpable: [Left:Yes] Doppler Audible: [Left:Yes] Extremity colors, hair growth, and conditions: Extremity Color: [Left:Hyperpigmented] Hair Growth on Extremity: [Left:No] Temperature of Extremity: [Left:Warm] Capillary Refill: [Left:< 3 seconds] Toe Nail Assessment Left: Right: Thick: Yes Discolored: Yes Deformed: No Improper Length and Hygiene: Yes Electronic Signature(s) Signed: 10/17/2017 5:08:19 PM By: Alejandro Mulling Entered By: Alejandro Mulling on 10/17/2017 11:24:58 Victor Daniels ([id number]) -------------------------------------------------------------------------------- Multi Wound Chart Details Patient Name: Victor Daniels. Date of Service: 10/17/2017 10:30 AM Medical Record Number: [id number] Patient Account Number: [id number] Date of Birth/Sex: [birth year] (71 y.o. Male) Treating RN: Phillis Haggis Primary Care Kajsa Butrum: Duncan Dull Other Clinician: Referring Lemar Bakos: Duncan Dull Treating Joplin Canty/Extender: Maxwell Caul Weeks in Treatment: 0 Vital Signs Height(in): 72 Pulse(bpm): 77 Weight(lbs): 295 Blood Pressure(mmHg): 165/89 Body Mass Index(BMI): 40 Temperature(F): 98.7 Respiratory Rate 20 (breaths/min): Photos: [6:No Photos] [N/A:N/A] Wound Location:  [6:Toe Great - Plantar] [N/A:N/A] Wounding Event: [6:Trauma] [N/A:N/A] Primary Etiology: [6:Trauma, Other] [N/A:N/A] Comorbid History: [6:Cataracts, Anemia, Asthma, N/A Chronic Obstructive Pulmonary Disease (COPD), Arrhythmia, Coronary Artery Disease, Hypertension, Type II Diabetes, Osteoarthritis] Date Acquired: [6:09/17/2017] [N/A:N/A] Weeks of Treatment: [6:0] [N/A:N/A] Wound Status: [6:Open] [N/A:N/A] Measurements L x W x D [6:1.6x3.3x0.5] [N/A:N/A] (cm) Area (cm) : [6:4.147] [N/A:N/A] Volume (cm) : [6:2.073] [N/A:N/A] Starting Position 1 [6:12] (o'clock): Ending Position 1 [6:12] (o'clock): Maximum Distance 1 (cm): [6:0.2] Undermining: [6:Yes] [N/A:N/A] Classification: [6:Partial Thickness] [N/A:N/A] Exudate Amount: [6:Large] [N/A:N/A] Exudate Type: [6:Serosanguineous] [N/A:N/A] Exudate Color: [6:red, brown] [N/A:N/A] Wound Margin: [6:Distinct, outline attached] [N/A:N/A] Granulation Amount: [6:Small (1-33%)] [N/A:N/A] Granulation Quality: [6:Pink] [N/A:N/A] Necrotic Amount: [6:Large (67-100%)] [N/A:N/A] Necrotic Tissue: [6:Eschar, Adherent Slough] [N/A:N/A] Epithelialization: [6:None] [N/A:N/A] Debridement: [6:Debridement (11042-11047)] [N/A:N/A] Pre-procedure [6:11:37] [N/A:N/A] Verification/Time Out Taken: Pain Control: [6:Lidocaine 4% Topical Solution N/A] Tissue Debrided: Fibrin/Slough, Exudates, N/A N/A Callus, Subcutaneous Level: Skin/Subcutaneous Tissue N/A N/A Debridement Area (sq cm): 5.28 N/A N/A Instrument: Curette N/A N/A Bleeding: Moderate N/A N/A Hemostasis Achieved:  Silver Nitrate N/A N/A Procedural Pain: 0 N/A N/A Post Procedural Pain: 0 N/A N/A Debridement Treatment Procedure was tolerated well N/A N/A Response: Post Debridement 1.6x3.4x0.4 N/A N/A Measurements L x W x D (cm) Post Debridement Volume: 1.709 N/A N/A (cm) Periwound Skin Texture: Callus: Yes N/A N/A Periwound Skin Moisture: No Abnormalities Noted N/A N/A Periwound Skin  Color: Erythema: Yes N/A N/A Erythema Location: Circumferential N/A N/A Temperature: No Abnormality N/A N/A Tenderness on Palpation: Yes N/A N/A Wound Preparation: Ulcer Cleansing: N/A N/A Rinsed/Irrigated with Saline Topical Anesthetic Applied: Other: lidocaine 4% Procedures Performed: Debridement N/A N/A Treatment Notes Electronic Signature(s) Signed: 10/17/2017 5:28:55 PM By: Baltazar Najjar MD Entered By: Baltazar Najjar on 10/17/2017 12:32:29 Victor Daniels ([id number]) -------------------------------------------------------------------------------- Multi-Disciplinary Care Plan Details Patient Name: Victor Daniels, SALTON. Date of Service: 10/17/2017 10:30 AM Medical Record Number: [id number] Patient Account Number: [id number] Date of Birth/Sex: 1946-06-12 (71 y.o. Male) Treating RN: Phillis Haggis Primary Care Angeleena Dueitt: Duncan Dull Other Clinician: Referring Crystalann Korf: Duncan Dull Treating Shaconda Hajduk/Extender: Altamese [place] in Treatment: 0 Active Inactive ` Orientation to the Wound Care Program Nursing Diagnoses: Knowledge deficit related to the wound healing center program Goals: Patient/caregiver will verbalize understanding of the Wound Healing Center Program Date Initiated: 10/17/2017 Target Resolution Date: 11/03/2017 Goal Status: Active Interventions: Provide education on orientation to the wound center Notes: ` Wound/Skin Impairment Nursing Diagnoses: Impaired tissue integrity Knowledge deficit related to ulceration/compromised skin integrity Goals: Ulcer/skin breakdown will have a volume reduction of 80% by week 12 Date Initiated: 10/17/2017 Target Resolution Date: 12/22/2017 Goal Status: Active Interventions: Assess patient/caregiver ability to perform ulcer/skin care regimen upon admission and as needed Assess ulceration(s) every visit Notes: Electronic Signature(s) Signed: 10/17/2017 5:08:19 PM By: Alejandro Mulling Entered  By: Alejandro Mulling on 10/17/2017 11:36:29 Victor Daniels ([id number]) -------------------------------------------------------------------------------- Pain Assessment Details Patient Name: Victor Daniels. Date of Service: 10/17/2017 10:30 AM Medical Record Number: [id number] Patient Account Number: [id number] Date of Birth/Sex: [birth year] (72 y.o. Male) Treating RN: Phillis Haggis Primary Care Lonie Newsham: Duncan Dull Other Clinician: Referring Sumedha Munnerlyn: Duncan Dull Treating Dynasti Kerman/Extender: Maxwell Caul Weeks in Treatment: 0 Active Problems Location of Pain Severity and Description of Pain Patient Has Paino No Site Locations Pain Management and Medication Current Pain Management: Electronic Signature(s) Signed: 10/17/2017 5:08:19 PM By: Alejandro Mulling Entered By: Alejandro Mulling on 10/17/2017 11:13:31 Victor Daniels ([id number]) -------------------------------------------------------------------------------- Patient/Caregiver Education Details Patient Name: Victor Daniels, DRAHEIM. Date of Service: 10/17/2017 10:30 AM Medical Record Number: [id number] Patient Account Number: [id number] Date of Birth/Gender: [birth year] (71 y.o. Male) Treating RN: Phillis Haggis Primary Care Physician: Duncan Dull Other Clinician: Referring Physician: Duncan Dull Treating Physician/Extender: Altamese  in Treatment: 0 Education Assessment Education Provided To: Patient Education Topics Provided Welcome To The Wound Care Center: Handouts: Welcome To The Wound Care Center Methods: Explain/Verbal Responses: State content correctly Wound/Skin Impairment: Handouts: Other: change dressing as ordered Methods: Demonstration, Explain/Verbal Responses: State content correctly Electronic Signature(s) Signed: 10/17/2017 5:08:19 PM By: Alejandro Mulling Entered By: Alejandro Mulling on 10/17/2017 13:09:47 Victor Daniels  ([id number]) -------------------------------------------------------------------------------- Wound Assessment Details Patient Name: Victor Daniels. Date of Service: 10/17/2017 10:30 AM Medical Record Number: [id number] Patient Account Number: [id number] Date of Birth/Sex: Nov 06, 1946 (71 y.o. Male) Treating RN: Phillis Haggis Primary Care Marteze Vecchio: Duncan Dull Other Clinician: Referring Avery Eustice: Duncan Dull Treating Vandell Kun/Extender: Maxwell Caul Weeks in Treatment: 0 Wound Status Wound Number: 6 Primary Trauma, Other Etiology: Wound Location: Toe Great - Plantar Wound Open Wounding Event: Trauma Status: Date  Acquired: 09/17/2017 Comorbid Cataracts, Anemia, Asthma, Chronic Obstructive Weeks Of Treatment: 0 History: Pulmonary Disease (COPD), Arrhythmia, Clustered Wound: No Coronary Artery Disease, Hypertension, Type II Diabetes, Osteoarthritis Photos Photo Uploaded By: Alejandro Mulling on 10/17/2017 16:21:18 Wound Measurements Length: (cm) 1.6 Width: (cm) 3.3 Depth: (cm) 0.5 Area: (cm) 4.147 Volume: (cm) 2.073 % Reduction in Area: % Reduction in Volume: Epithelialization: None Tunneling: No Undermining: Yes Starting Position (o'clock): 12 Ending Position (o'clock): 12 Maximum Distance: (cm) 0.2 Wound Description Classification: Partial Thickness Wound Margin: Distinct, outline attached Exudate Amount: Large Exudate Type: Serosanguineous Exudate Color: red, brown Foul Odor After Cleansing: No Slough/Fibrino Yes Wound Bed Granulation Amount: Small (1-33%) Granulation Quality: Pink Necrotic Amount: Large (67-100%) Necrotic Quality: Eschar, Adherent [address] Victor R. ([id number]) Periwound Skin Texture Texture Color No Abnormalities Noted: No No Abnormalities Noted: No Callus: Yes Erythema: Yes Erythema Location: Circumferential Moisture No Abnormalities Noted: No Temperature / Pain Temperature: No Abnormality Tenderness on  Palpation: Yes Wound Preparation Ulcer Cleansing: Rinsed/Irrigated with Saline Topical Anesthetic Applied: Other: lidocaine 4%, Electronic Signature(s) Signed: 10/17/2017 5:08:19 PM By: Alejandro Mulling Entered By: Alejandro Mulling on 10/17/2017 11:29:02 Victor Daniels ([id number]) -------------------------------------------------------------------------------- Vitals Details Patient Name: Victor Daniels. Date of Service: 10/17/2017 10:30 AM Medical Record Number: [id number] Patient Account Number: [id number] Date of Birth/Sex: 1946-09-19 (71 y.o. Male) Treating RN: Phillis Haggis Primary Care Jayonna Meyering: Duncan Dull Other Clinician: Referring Miriam Liles: Duncan Dull Treating Charlann Wayne/Extender: Altamese [place] in Treatment: 0 Vital Signs Time Taken: 11:13 Temperature (F): 98.7 Height (in): 72 Pulse (bpm): 77 Source: Stated Respiratory Rate (breaths/min): 20 Weight (lbs): 295 Blood Pressure (mmHg): 165/89 Source: Stated Reference Range: 80 - 120 mg / dl Body Mass Index (BMI): 40 Electronic Signature(s) Signed: 10/17/2017 5:08:19 PM By: Alejandro Mulling Entered By: Alejandro Mulling on 10/17/2017 11:25:58

## 2017-10-19 NOTE — Progress Notes (Signed)
SEPHIROTH, MCLUCKIE ([id number]) Visit Report for 10/17/2017 Abuse/Suicide Risk Screen Details Patient Name: Victor Daniels, Victor Daniels. Date of Service: 10/17/2017 10:30 AM Medical Record Number: [id number] Patient Account Number: [id number] Date of Birth/Sex: [birth year] (71 y.o. Male) Treating RN: Phillis Haggis Primary Care Wendolyn Raso: Duncan Dull Other Clinician: Referring Chena Chohan: Duncan Dull Treating Sachi Boulay/Extender: Maxwell Caul Weeks in Treatment: 0 Abuse/Suicide Risk Screen Items Answer ABUSE/SUICIDE RISK SCREEN: Has anyone close to you tried to hurt or harm you recentlyo No Do you feel uncomfortable with anyone in your familyo No Has anyone forced you do things that you didnot want to doo No Do you have any thoughts of harming yourselfo No Patient displays signs or symptoms of abuse and/or neglect. No Electronic Signature(s) Signed: 10/17/2017 5:08:19 PM By: Alejandro Mulling Entered By: Alejandro Mulling on 10/17/2017 11:17:04 Victor Daniels ([id number]) -------------------------------------------------------------------------------- Activities of Daily Living Details Patient Name: Victor Daniels. Date of Service: 10/17/2017 10:30 AM Medical Record Number: [id number] Patient Account Number: [id number] Date of Birth/Sex: [birth year] (71 y.o. Male) Treating RN: Phillis Haggis Primary Care Makinze Jani: Duncan Dull Other Clinician: Referring Uzma Hellmer: Duncan Dull Treating Kj Imbert/Extender: Maxwell Caul Weeks in Treatment: 0 Activities of Daily Living Items Answer Activities of Daily Living (Please select one for each item) Drive Automobile Completely Able Take Medications Completely Able Use Telephone Completely Able Care for Appearance Completely Able Use Toilet Completely Able Bath / Shower Completely Able Dress Self Completely Able Feed Self Completely Able Walk Completely Able Get In / Out Bed Completely Able Housework Completely  Able Prepare Meals Completely Able Handle Money Completely Able Shop for Self Completely Able Electronic Signature(s) Signed: 10/17/2017 5:08:19 PM By: Alejandro Mulling Entered By: Alejandro Mulling on 10/17/2017 11:17:26 Victor Daniels ([id number]) -------------------------------------------------------------------------------- Education Assessment Details Patient Name: Victor Daniels. Date of Service: 10/17/2017 10:30 AM Medical Record Number: [id number] Patient Account Number: [id number] Date of Birth/Sex: June 25, 1946 (71 y.o. Male) Treating RN: Phillis Haggis Primary Care Tavonte Seybold: Duncan Dull Other Clinician: Referring Prathik Aman: Duncan Dull Treating Moataz Tavis/Extender: Altamese [place] in Treatment: 0 Primary Learner Assessed: Patient Learning Preferences/Education Level/Primary Language Learning Preference: Explanation, Printed Material Highest Education Level: College or Above Preferred Language: English Cognitive Barrier Assessment/Beliefs Language Barrier: No Translator Needed: No Memory Deficit: No Emotional Barrier: No Cultural/Religious Beliefs Affecting Medical Care: No Physical Barrier Assessment Impaired Vision: Yes Glasses Impaired Hearing: No Decreased Hand dexterity: No Knowledge/Comprehension Assessment Knowledge Level: Medium Comprehension Level: Medium Ability to understand written Medium instructions: Ability to understand verbal Medium instructions: Motivation Assessment Anxiety Level: Calm Cooperation: Cooperative Education Importance: Acknowledges Need Interest in Health Problems: Asks Questions Perception: Coherent Willingness to Engage in Self- Medium Management Activities: Readiness to Engage in Self- Medium Management Activities: Electronic Signature(s) Signed: 10/17/2017 5:08:19 PM By: Alejandro Mulling Entered By: Alejandro Mulling on 10/17/2017 11:17:54 Victor Daniels  ([id number]) -------------------------------------------------------------------------------- Fall Risk Assessment Details Patient Name: Victor Daniels. Date of Service: 10/17/2017 10:30 AM Medical Record Number: [id number] Patient Account Number: [id number] Date of Birth/Sex: 1946-05-31 (71 y.o. Male) Treating RN: Phillis Haggis Primary Care Elenore Wanninger: Duncan Dull Other Clinician: Referring Desa Rech: Duncan Dull Treating Ravenna Legore/Extender: Altamese [place] in Treatment: 0 Fall Risk Assessment Items Have you had 2 or more falls in the last 12 monthso 0 No Have you had any fall that resulted in injury in the last 12 monthso 0 No FALL RISK ASSESSMENT: History of falling - immediate or within 3 months 0 No Secondary diagnosis 0 No Ambulatory aid None/bed rest/wheelchair/nurse 0 No Crutches/cane/walker 0  No Furniture 0 No IV Access/Saline Lock 0 No Gait/Training Normal/bed rest/immobile 0 No Weak 0 No Impaired 0 No Mental Status Oriented to own ability 0 Yes Electronic Signature(s) Signed: 10/17/2017 5:08:19 PM By: Alejandro MullingPinkerton, Debra Entered By: Alejandro MullingPinkerton, Debra on 10/17/2017 11:18:53 Victor KempKIRCHGESSNER, Keyler Daniels. ([id number]) -------------------------------------------------------------------------------- Foot Assessment Details Patient Name: Victor Daniels. Date of Service: 10/17/2017 10:30 AM Medical Record Number: [id number] Patient Account Number: [id number] Date of Birth/Sex: 07-08-1946 [age] y.o. Male) Treating RN: Phillis HaggisPinkerton, Debi Primary Care Tashon Capp: Duncan Dullullo, Teresa Other Clinician: Referring Jamala Kohen: Duncan Dullullo, Teresa Treating Patte Winkel/Extender: Maxwell CaulOBSON, MICHAEL G Weeks in Treatment: 0 Foot Assessment Items Site Locations + = Sensation present, - = Sensation absent, C = Callus, U = Ulcer Daniels = Redness, W = Warmth, M = Maceration, PU = Pre-ulcerative lesion F = Fissure, S = Swelling, D = Dryness Assessment Right: Left: Other Deformity: No No Prior Foot  Ulcer: No No Prior Amputation: No No Charcot Joint: No No Ambulatory Status: Ambulatory Without Help Gait: Steady Electronic Signature(s) Signed: 10/17/2017 5:08:19 PM By: Alejandro MullingPinkerton, Debra Entered By: Alejandro MullingPinkerton, Debra on 10/17/2017 11:24:17 Victor KempKIRCHGESSNER, Jazion Daniels. ([id number]) -------------------------------------------------------------------------------- Nutrition Risk Assessment Details Patient Name: Victor KempKIRCHGESSNER, Elin Daniels. Date of Service: 10/17/2017 10:30 AM Medical Record Number: [id number] Patient Account Number: [id number] Date of Birth/Sex: 07-08-1946 [age] y.o. Male) Treating RN: Phillis HaggisPinkerton, Debi Primary Care Charlye Spare: Duncan Dullullo, Teresa Other Clinician: Referring Emilly Lavey: Duncan Dullullo, Teresa Treating Jomaira Darr/Extender: Maxwell CaulOBSON, MICHAEL G Weeks in Treatment: 0 Height (in): 72 Weight (lbs): 295 Body Mass Index (BMI): 40 Nutrition Risk Assessment Items NUTRITION RISK SCREEN: I have an illness or condition that made me change the kind and/or amount of 0 No food I eat I eat fewer than two meals per day 0 No I eat few fruits and vegetables, or milk products 0 No I have three or more drinks of beer, liquor or wine almost every day 0 No I have tooth or mouth problems that make it hard for me to eat 0 No I don't always have enough money to buy the food I need 0 No I eat alone most of the time 0 No I take three or more different prescribed or over-the-counter drugs a day 1 Yes Without wanting to, I have lost or gained 10 pounds in the last six months 0 No I am not always physically able to shop, cook and/or feed myself 0 No Nutrition Protocols Good Risk Protocol 0 No interventions needed Moderate Risk Protocol Electronic Signature(s) Signed: 10/17/2017 5:08:19 PM By: Alejandro MullingPinkerton, Debra Entered By: Alejandro MullingPinkerton, Debra on 10/17/2017 11:19:03

## 2017-10-24 ENCOUNTER — Encounter: Payer: Medicare HMO | Admitting: Internal Medicine

## 2017-10-24 DIAGNOSIS — I2510 Atherosclerotic heart disease of native coronary artery without angina pectoris: Secondary | ICD-10-CM | POA: Diagnosis not present

## 2017-10-24 DIAGNOSIS — Z9981 Dependence on supplemental oxygen: Secondary | ICD-10-CM | POA: Diagnosis not present

## 2017-10-24 DIAGNOSIS — E1142 Type 2 diabetes mellitus with diabetic polyneuropathy: Secondary | ICD-10-CM | POA: Diagnosis not present

## 2017-10-24 DIAGNOSIS — L97522 Non-pressure chronic ulcer of other part of left foot with fat layer exposed: Secondary | ICD-10-CM | POA: Diagnosis not present

## 2017-10-24 DIAGNOSIS — L97521 Non-pressure chronic ulcer of other part of left foot limited to breakdown of skin: Secondary | ICD-10-CM | POA: Diagnosis not present

## 2017-10-24 DIAGNOSIS — E11621 Type 2 diabetes mellitus with foot ulcer: Secondary | ICD-10-CM | POA: Diagnosis not present

## 2017-10-24 DIAGNOSIS — M1990 Unspecified osteoarthritis, unspecified site: Secondary | ICD-10-CM | POA: Diagnosis not present

## 2017-10-24 DIAGNOSIS — J449 Chronic obstructive pulmonary disease, unspecified: Secondary | ICD-10-CM | POA: Diagnosis not present

## 2017-10-24 DIAGNOSIS — Z87891 Personal history of nicotine dependence: Secondary | ICD-10-CM | POA: Diagnosis not present

## 2017-10-24 DIAGNOSIS — I10 Essential (primary) hypertension: Secondary | ICD-10-CM | POA: Diagnosis not present

## 2017-10-24 DIAGNOSIS — I872 Venous insufficiency (chronic) (peripheral): Secondary | ICD-10-CM | POA: Diagnosis not present

## 2017-10-26 NOTE — Progress Notes (Signed)
OUSMANE, SEEMAN ([id number]) Visit Report for 10/24/2017 Arrival Information Details Patient Name: Victor Daniels, Victor Daniels. Date of Service: 10/24/2017 12:30 PM Medical Record Number: [id number] Patient Account Number: [id number] Date of Birth/Sex: September 27, 1946 (71 y.o. Male) Treating RN: Curtis Sites Primary Care Fabiana Dromgoole: Duncan Dull Other Clinician: Referring Latitia Housewright: Duncan Dull Treating Loralyn Rachel/Extender: Altamese [place] in Treatment: 1 Visit Information History Since Last Visit Added or deleted any medications: No Patient Arrived: Ambulatory Any new allergies or adverse reactions: No Arrival Time: 12:42 Had a fall or experienced change in No Accompanied By: self activities of daily living that may affect Transfer Assistance: None risk of falls: Patient Identification Verified: Yes Signs or symptoms of abuse/neglect since last visito No Secondary Verification Process Completed: Yes Hospitalized since last visit: No Patient Requires Transmission-Based No Has Dressing in Place as Prescribed: Yes Precautions: Pain Present Now: No Patient Has Alerts: No Electronic Signature(s) Signed: 10/24/2017 4:29:50 PM By: Curtis Sites Entered By: Curtis Sites on 10/24/2017 12:42:54 Victor Daniels ([id number]) -------------------------------------------------------------------------------- Clinic Level of Care Assessment Details Patient Name: Victor Daniels. Date of Service: 10/24/2017 12:30 PM Medical Record Number: [id number] Patient Account Number: [id number] Date of Birth/Sex: 06/04/1946 (71 y.o. Male) Treating RN: Curtis Sites Primary Care Paislynn Hegstrom: Duncan Dull Other Clinician: Referring Detrick Dani: Duncan Dull Treating Hajira Verhagen/Extender: Altamese [place] in Treatment: 1 Clinic Level of Care Assessment Items TOOL 4 Quantity Score []  - Use when only an EandM is performed on FOLLOW-UP visit 0 ASSESSMENTS - Nursing Assessment /  Reassessment X - Reassessment of Co-morbidities (includes updates in patient status) 1 10 X- 1 5 Reassessment of Adherence to Treatment Plan ASSESSMENTS - Wound and Skin Assessment / Reassessment []  - Simple Wound Assessment / Reassessment - one wound 0 X- 2 5 Complex Wound Assessment / Reassessment - multiple wounds []  - 0 Dermatologic / Skin Assessment (not related to wound area) ASSESSMENTS - Focused Assessment []  - Circumferential Edema Measurements - multi extremities 0 []  - 0 Nutritional Assessment / Counseling / Intervention X- 1 5 Lower Extremity Assessment (monofilament, tuning fork, pulses) []  - 0 Peripheral Arterial Disease Assessment (using hand held doppler) ASSESSMENTS - Ostomy and/or Continence Assessment and Care []  - Incontinence Assessment and Management 0 []  - 0 Ostomy Care Assessment and Management (repouching, etc.) PROCESS - Coordination of Care X - Simple Patient / Family Education for ongoing care 1 15 []  - 0 Complex (extensive) Patient / Family Education for ongoing care []  - 0 Staff obtains Chiropractor, Records, Test Results / Process Orders []  - 0 Staff telephones HHA, Nursing Homes / Clarify orders / etc []  - 0 Routine Transfer to another Facility (non-emergent condition) []  - 0 Routine Hospital Admission (non-emergent condition) []  - 0 New Admissions / Manufacturing engineer / Ordering NPWT, Apligraf, etc. []  - 0 Emergency Hospital Admission (emergent condition) X- 1 10 Simple Discharge Coordination GABRYEL, FILES ([id number]) []  - 0 Complex (extensive) Discharge Coordination PROCESS - Special Needs []  - Pediatric / Minor Patient Management 0 []  - 0 Isolation Patient Management []  - 0 Hearing / Language / Visual special needs []  - 0 Assessment of Community assistance (transportation, D/C planning, etc.) []  - 0 Additional assistance / Altered mentation []  - 0 Support Surface(s) Assessment (bed, cushion, seat,  etc.) INTERVENTIONS - Wound Cleansing / Measurement X - Simple Wound Cleansing - one wound 1 5 []  - 0 Complex Wound Cleansing - multiple wounds X- 1 5 Wound Imaging (photographs - any number of wounds) []  - 0 Wound Tracing (  instead of photographs) X- 1 5 Simple Wound Measurement - one wound []  - 0 Complex Wound Measurement - multiple wounds INTERVENTIONS - Wound Dressings X - Small Wound Dressing one or multiple wounds 1 10 []  - 0 Medium Wound Dressing one or multiple wounds []  - 0 Large Wound Dressing one or multiple wounds []  - 0 Application of Medications - topical []  - 0 Application of Medications - injection INTERVENTIONS - Miscellaneous []  - External ear exam 0 []  - 0 Specimen Collection (cultures, biopsies, blood, body fluids, etc.) []  - 0 Specimen(s) / Culture(s) sent or taken to Lab for analysis []  - 0 Patient Transfer (multiple staff / Nurse, adultHoyer Lift / Similar devices) []  - 0 Simple Staple / Suture removal (25 or less) []  - 0 Complex Staple / Suture removal (26 or more) []  - 0 Hypo / Hyperglycemic Management (close monitor of Blood Glucose) []  - 0 Ankle / Brachial Index (ABI) - do not check if billed separately X- 1 5 Vital Signs Victor Daniels, Victor R. ([id number]) Has the patient been seen at the hospital within the last three years: Yes Total Score: 85 Level Of Care: New/Established - Level 3 Electronic Signature(s) Signed: 10/24/2017 4:29:50 PM By: Curtis Sitesorthy, Joanna Entered By: Curtis Sitesorthy, Joanna on 10/24/2017 15:02:51 Victor Daniels, Victor R. ([id number]) -------------------------------------------------------------------------------- Encounter Discharge Information Details Patient Name: Victor Daniels, Victor R. Date of Service: 10/24/2017 12:30 PM Medical Record Number: [id number] Patient Account Number: [id number] Date of Birth/Sex: 01-Apr-1946 [age] y.o. Male) Treating RN: Curtis Sitesorthy, Joanna Primary Care Dyon Rotert: Duncan Dullullo, Teresa Other Clinician: Referring Xiao Graul:  Duncan Dullullo, Teresa Treating Bret Stamour/Extender: Altamese CarolinaOBSON, MICHAEL G Weeks in Treatment: 1 Encounter Discharge Information Items Discharge Pain Level: 0 Discharge Condition: Stable Ambulatory Status: Ambulatory Discharge Destination: Home Transportation: Private Auto Accompanied By: self Schedule Follow-up Appointment: Yes Medication Reconciliation completed and No provided to Patient/Care Nysa Sarin: Provided on Clinical Summary of Care: 10/24/2017 Form Type Recipient Paper Patient EK Electronic Signature(s) Signed: 10/24/2017 3:04:19 PM By: Curtis Sitesorthy, Joanna Entered By: Curtis Sitesorthy, Joanna on 10/24/2017 15:04:18 Victor Daniels, Victor R. ([id number]) -------------------------------------------------------------------------------- Lower Extremity Assessment Details Patient Name: Victor Daniels, Victor R. Date of Service: 10/24/2017 12:30 PM Medical Record Number: [id number] Patient Account Number: [id number] Date of Birth/Sex: 01-Apr-1946 [age] y.o. Male) Treating RN: Curtis Sitesorthy, Joanna Primary Care Takai Chiaramonte: Duncan Dullullo, Teresa Other Clinician: Referring Tehani Mersman: Duncan Dullullo, Teresa Treating Lilley Hubble/Extender: Altamese CarolinaOBSON, MICHAEL G Weeks in Treatment: 1 Vascular Assessment Pulses: Dorsalis Pedis Palpable: [Left:Yes] Posterior Tibial Extremity colors, hair growth, and conditions: Extremity Color: [Left:Hyperpigmented] Hair Growth on Extremity: [Left:No] Temperature of Extremity: [Left:Warm] Capillary Refill: [Left:< 3 seconds] Toe Nail Assessment Left: Right: Thick: Yes Discolored: No Deformed: No Improper Length and Hygiene: Yes Electronic Signature(s) Signed: 10/24/2017 4:29:50 PM By: Curtis Sitesorthy, Joanna Entered By: Curtis Sitesorthy, Joanna on 10/24/2017 13:03:07 Victor Daniels, Rishawn R. ([id number]) -------------------------------------------------------------------------------- Multi Wound Chart Details Patient Name: Victor Daniels, Victor R. Date of Service: 10/24/2017 12:30 PM Medical Record Number:  [id number] Patient Account Number: [id number] Date of Birth/Sex: 01-Apr-1946 [age] y.o. Male) Treating RN: Curtis Sitesorthy, Joanna Primary Care Seraphim Affinito: Duncan Dullullo, Teresa Other Clinician: Referring Kanye Depree: Duncan Dullullo, Teresa Treating Fiana Gladu/Extender: Altamese CarolinaOBSON, MICHAEL G Weeks in Treatment: 1 Vital Signs Height(in): 72 Pulse(bpm): 71 Weight(lbs): 295 Blood Pressure(mmHg): 121/72 Body Mass Index(BMI): 40 Temperature(F): 98.5 Respiratory Rate 20 (breaths/min): Photos: [6:No Photos] [N/A:N/A] Wound Location: [6:Left Toe Great - Plantar] [N/A:N/A] Wounding Event: [6:Trauma] [N/A:N/A] Primary Etiology: [6:Trauma, Other] [N/A:N/A] Comorbid History: [6:Cataracts, Anemia, Asthma, N/A Chronic Obstructive Pulmonary Disease (COPD), Arrhythmia, Coronary Artery Disease, Hypertension, Type II Diabetes, Osteoarthritis] Date Acquired: [6:09/17/2017] [N/A:N/A] Weeks of Treatment: [6:1] [N/A:N/A] Wound Status: [6:Open] [N/A:N/A] Measurements  L x W x D [6:0.4x1.7x0.2] [N/A:N/A] (cm) Area (cm) : [6:0.534] [N/A:N/A] Volume (cm) : [6:0.107] [N/A:N/A] % Reduction in Area: [6:87.10%] [N/A:N/A] % Reduction in Volume: [6:94.80%] [N/A:N/A] Classification: [6:Partial Thickness] [N/A:N/A] Exudate Amount: [6:Large] [N/A:N/A] Exudate Type: [6:Serosanguineous] [N/A:N/A] Exudate Color: [6:red, brown] [N/A:N/A] Wound Margin: [6:Distinct, outline attached] [N/A:N/A] Granulation Amount: [6:Medium (34-66%)] [N/A:N/A] Granulation Quality: [6:Pink] [N/A:N/A] Necrotic Amount: [6:Medium (34-66%)] [N/A:N/A] Necrotic Tissue: [6:Eschar, Adherent Slough] [N/A:N/A] Epithelialization: [6:None] [N/A:N/A] Periwound Skin Texture: [6:Callus: Yes] [N/A:N/A] Periwound Skin Moisture: [6:No Abnormalities Noted] [N/A:N/A] Periwound Skin Color: [6:Erythema: Yes] [N/A:N/A] Erythema Location: [6:Circumferential] [N/A:N/A] Temperature: [6:No Abnormality] [N/A:N/A] Tenderness on Palpation: [6:Yes] [N/A:N/A] Wound Preparation: [6:Ulcer  Cleansing: Rinsed/Irrigated with Saline] [N/A:N/A] Topical Anesthetic Applied: Other: lidocaine 4% Treatment Notes Electronic Signature(s) Signed: 10/24/2017 4:32:10 PM By: Baltazar Najjar MD Entered By: Baltazar Najjar on 10/24/2017 13:32:39 Victor Daniels ([id number]) -------------------------------------------------------------------------------- Multi-Disciplinary Care Plan Details Patient Name: Victor Daniels, FUELLING. Date of Service: 10/24/2017 12:30 PM Medical Record Number: [id number] Patient Account Number: [id number] Date of Birth/Sex: June 28, 1946 (71 y.o. Male) Treating RN: Curtis Sites Primary Care Breniya Goertzen: Duncan Dull Other Clinician: Referring Chelle Cayton: Duncan Dull Treating Errin Whitelaw/Extender: Altamese [place] in Treatment: 1 Active Inactive ` Orientation to the Wound Care Program Nursing Diagnoses: Knowledge deficit related to the wound healing center program Goals: Patient/caregiver will verbalize understanding of the Wound Healing Center Program Date Initiated: 10/17/2017 Target Resolution Date: 11/03/2017 Goal Status: Active Interventions: Provide education on orientation to the wound center Notes: ` Wound/Skin Impairment Nursing Diagnoses: Impaired tissue integrity Knowledge deficit related to ulceration/compromised skin integrity Goals: Ulcer/skin breakdown will have a volume reduction of 80% by week 12 Date Initiated: 10/17/2017 Target Resolution Date: 12/22/2017 Goal Status: Active Interventions: Assess patient/caregiver ability to perform ulcer/skin care regimen upon admission and as needed Assess ulceration(s) every visit Notes: Electronic Signature(s) Signed: 10/24/2017 4:29:50 PM By: Curtis Sites Entered By: Curtis Sites on 10/24/2017 13:04:28 Victor Daniels ([id number]) -------------------------------------------------------------------------------- Pain Assessment Details Patient Name: Victor Daniels. Date of Service: 10/24/2017 12:30 PM Medical Record Number: [id number] Patient Account Number: [id number] Date of Birth/Sex: 1946/11/15 (71 y.o. Male) Treating RN: Curtis Sites Primary Care Ajdin Macke: Duncan Dull Other Clinician: Referring Kapono Luhn: Duncan Dull Treating Alvah Gilder/Extender: Maxwell Caul Weeks in Treatment: 1 Active Problems Location of Pain Severity and Description of Pain Patient Has Paino No Site Locations Pain Management and Medication Current Pain Management: Electronic Signature(s) Signed: 10/24/2017 4:29:50 PM By: Curtis Sites Entered By: Curtis Sites on 10/24/2017 12:43:00 Victor Daniels ([id number]) -------------------------------------------------------------------------------- Patient/Caregiver Education Details Patient Name: AVARY, PITSENBARGER. Date of Service: 10/24/2017 12:30 PM Medical Record Number: [id number] Patient Account Number: [id number] Date of Birth/Gender: January 23, 1946 (71 y.o. Male) Treating RN: Curtis Sites Primary Care Physician: Duncan Dull Other Clinician: Referring Physician: Duncan Dull Treating Physician/Extender: Altamese [place] in Treatment: 1 Education Assessment Education Provided To: Patient Education Topics Provided Wound/Skin Impairment: Handouts: Other: wound care as ordered Methods: Demonstration, Explain/Verbal Responses: State content correctly Electronic Signature(s) Signed: 10/24/2017 4:29:50 PM By: Curtis Sites Entered By: Curtis Sites on 10/24/2017 15:04:37 Victor Daniels ([id number]) -------------------------------------------------------------------------------- Wound Assessment Details Patient Name: ALTAN, KRAAI. Date of Service: 10/24/2017 12:30 PM Medical Record Number: [id number] Patient Account Number: [id number] Date of Birth/Sex: 10/11/1946 (71 y.o. Male) Treating RN: Curtis Sites Primary Care Eugene Isadore: Duncan Dull Other  Clinician: Referring Kein Carlberg: Duncan Dull Treating Celesta Funderburk/Extender: Maxwell Caul We[place] in Treatment: 1 Wound Status Wound Number: 6 Primary Trauma, Other Etiology: Wound Location: Left Toe Great - Plantar Wound Open Wounding Event:  Trauma Status: Date Acquired: 09/17/2017 Comorbid Cataracts, Anemia, Asthma, Chronic Obstructive Weeks Of Treatment: 1 History: Pulmonary Disease (COPD), Arrhythmia, Clustered Wound: No Coronary Artery Disease, Hypertension, Type II Diabetes, Osteoarthritis Photos Photo Uploaded By: Curtis Sites on 10/24/2017 15:12:49 Wound Measurements Length: (cm) 0.4 Width: (cm) 1.7 Depth: (cm) 0.2 Area: (cm) 0.534 Volume: (cm) 0.107 % Reduction in Area: 87.1% % Reduction in Volume: 94.8% Epithelialization: None Tunneling: No Undermining: No Wound Description Classification: Partial Thickness Wound Margin: Distinct, outline attached Exudate Amount: Large Exudate Type: Serosanguineous Exudate Color: red, brown Foul Odor After Cleansing: No Slough/Fibrino Yes Wound Bed Granulation Amount: Medium (34-66%) Granulation Quality: Pink Necrotic Amount: Medium (34-66%) Necrotic Quality: Eschar, Adherent Slough Periwound Skin Texture Texture Color No Abnormalities Noted: No No Abnormalities Noted: No DARIVS, LUNDEN. ([id number]) Callus: Yes Erythema: Yes Erythema Location: Circumferential Moisture No Abnormalities Noted: No Temperature / Pain Temperature: No Abnormality Tenderness on Palpation: Yes Wound Preparation Ulcer Cleansing: Rinsed/Irrigated with Saline Topical Anesthetic Applied: Other: lidocaine 4%, Treatment Notes Wound #6 (Left, Plantar Toe Great) 1. Cleansed with: Clean wound with Normal Saline 2. Anesthetic Topical Lidocaine 4% cream to wound bed prior to debridement 4. Dressing Applied: Other dressing (specify in notes) Notes silvercel, coverlet Electronic Signature(s) Signed: 10/24/2017 4:29:50 PM By:  Curtis Sites Entered By: Curtis Sites on 10/24/2017 12:53:04 Victor Daniels ([id number]) -------------------------------------------------------------------------------- Vitals Details Patient Name: Victor Daniels. Date of Service: 10/24/2017 12:30 PM Medical Record Number: [id number] Patient Account Number: [id number] Date of Birth/Sex: 12/28/1945 (71 y.o. Male) Treating RN: Curtis Sites Primary Care Darcel Zick: Duncan Dull Other Clinician: Referring Tiombe Tomeo: Duncan Dull Treating Edell Mesenbrink/Extender: Altamese [place] in Treatment: 1 Vital Signs Time Taken: 12:44 Temperature (F): 98.5 Height (in): 72 Pulse (bpm): 71 Weight (lbs): 295 Respiratory Rate (breaths/min): 20 Body Mass Index (BMI): 40 Blood Pressure (mmHg): 121/72 Reference Range: 80 - 120 mg / dl Electronic Signature(s) Signed: 10/24/2017 4:29:50 PM By: Curtis Sites Entered By: Curtis Sites on 10/24/2017 12:45:40

## 2017-10-26 NOTE — Progress Notes (Signed)
Victor Daniels, Victor Daniels ([id number]) Visit Report for 10/24/2017 HPI Details Patient Name: Victor Daniels, Victor Daniels. Date of Service: 10/24/2017 12:30 PM Medical Record Number: [id number] Patient Account Number: [id number] Date of Birth/Sex: [birth year] (71 y.o. Male) Treating RN: Curtis Sites Primary Care Provider: Duncan Dull Other Clinician: Referring Provider: Duncan Dull Treating Provider/Extender: Altamese [place] in Treatment: 1 History of Present Illness Location: Patient has bilateral venous stasis with ulcerations with inflammation of the left leg Severity: The wounds are very clean and had been improving and measured smaller with silver collagen Duration: The patient has had problems with venous ulcers bilaterally from time to time for the last couple of years HPI Description: He returns today in followup of his left lower extremity open wound secondary to chronic venous insufficiency and ulceration. He removed his wraps yesterday as he was going to see his primary care physician. His notice some increased drainage from the wound and his primary care doctor increased his diuretics. He has no fevers or chills. He does have compression garments but he does not wear them because her too hard to get on. He is looking into obtaining some with [employer] sides that may be easier for him to put on. In the meantime he would like to continue with his compression wraps. READMISSION 09/05/16; this patient is a 71 year old man who was in this clinic on 2 visits 2 years ago in 2015. At that point he had wounds on his left leg felt to be secondary to chronic venous insufficiency with bilateral hemosiderin deposition. He ultimately was discharged with graded pressure stockings. The patient states he has stockings and is reasonably compliant with them we didn't come in with him on today. Is apparently a borderline diabetic not currently on any treatment undergoing nonpharmacologic therapy  with diet etc. The patient states he is actually gained weight however. We month ago the patient started to notice more edema in both legs. The skin cracked open and he has had 3 draining areas on the left leg. He states that both legs drain so that night he'll often wake up with his lower pajama legs wet. He has not felt systemically unwell. He does have some shortness of breath but no chest pain. He tells me that he went to see Dr. dew of vascular surgery and had some tests done in his legs. He was told that nothing further could be done surgically for this. I don't have these notes order these tests at this point. His ABIs in this clinic were noncompressible at 1.57 on the left and 1.53 on the right 09/12/16; the patient had to take his wraps off on Sunday when he got the dressings wet. Nevertheless all his wound areas have closed there is no open area. He has severe bilateral venous insufficiency and has recently been to see Dr. dew Y haven't actually seen these records. Brought in the stockings he has these are 15-20 mmHg, this will be insufficient for him he will need at least 20-30 Hg perhaps 30-40s. We have given the address of elastic therapy and [place], with the measurements we should be able to get hematocrit compression by mail READMISSION 10//24/18; this is a 71 year old man who is a type II diabetic but is not on any current therapy other than diet. He has COPD on chronic oxygen a current nonsmoker. He tells me that about a month ago he was walking through a gait and managed to traumatize the plantar aspect of his left great toe. He developed  a wound on the plantar aspect of the toe at the extreme base of the toe. He is been using topical antibiotics. We have seen him before in this clinic but predominantly related to chronic venous insufficiency wounds. He has seen Dr. dew in the past but I don't have any of those records at my fingertips. He has known noncompressible ABIs  bilaterally. 10/24/17; this patient has a wound on his plantar left great toe right at the base of the toe. Aggressive debridement last week we have been using silver alginate and he thinks it is doing well Electronic Signature(s) Signed: 10/24/2017 4:32:10 PM By: Baltazar Najjarobson, Michael MD Victor Daniels, Victor R. ([id number]) Entered By: Baltazar Najjarobson, Michael on 10/24/2017 13:34:18 Victor Daniels, Victor R. ([id number]) -------------------------------------------------------------------------------- Physical Exam Details Patient Name: Victor Daniels, Victor R. Date of Service: 10/24/2017 12:30 PM Medical Record Number: [id number] Patient Account Number: [id number] Date of Birth/Sex: February 08, 1946 [age] y.o. Male) Treating RN: Curtis Sitesorthy, Joanna Primary Care Provider: Duncan Dullullo, Teresa Other Clinician: Referring Provider: Duncan Dullullo, Teresa Treating Provider/Extender: Maxwell CaulOBSON, MICHAEL G Weeks in Treatment: 1 Constitutional Sitting or standing Blood Pressure is within target range for patient.. Pulse regular and within target range for patient.Marland Kitchen. Respirations regular, non-labored and within target range.. Temperature is normal and within the target range for the patient.Marland Kitchen. appears in no distress. Eyes Conjunctivae clear. No discharge. Respiratory Respiratory effort is easy and symmetric bilaterally. Rate is normal at rest and on room air.. Cardiovascular Pedal pulses are reduced and very difficult to feel.. Severe chronic venous insufficiency but the edema is reasonably well- controlled. Lymphatic None palpable in the popliteal or inguinal area. Integumentary (Hair, Skin) Changes of chronic venous insufficiency bilaterally in the lower extremities. Psychiatric No evidence of depression, anxiety, or agitation. Calm, cooperative, and communicative. Appropriate interactions and affect.. Notes Wound exam; plantar aspect of the left great toe right at the proximal end of it. Linear wound that looks a lot better than  last week. There is some surrounding slough although this does not seem to interfere with the margins of the wound. No debridement was done today. Granulation looks better wound looks healthier no evidence of infection Electronic Signature(s) Signed: 10/24/2017 4:32:10 PM By: Baltazar Najjarobson, Michael MD Entered By: Baltazar Najjarobson, Michael on 10/24/2017 13:37:37 Victor Daniels, Alyn R. ([id number]) -------------------------------------------------------------------------------- Physician Orders Details Patient Name: Victor Daniels, Victor R. Date of Service: 10/24/2017 12:30 PM Medical Record Number: [id number] Patient Account Number: [id number] Date of Birth/Sex: February 08, 1946 [age] y.o. Male) Treating RN: Curtis Sitesorthy, Joanna Primary Care Provider: Duncan Dullullo, Teresa Other Clinician: Referring Provider: Duncan Dullullo, Teresa Treating Provider/Extender: Altamese CarolinaOBSON, MICHAEL G Weeks in Treatment: 1 Verbal / Phone Orders: No Diagnosis Coding Wound Cleansing Wound #6 Left,Plantar Toe Great o Clean wound with Normal Saline. o Cleanse wound with mild soap and water o May shower with protection. Anesthetic Wound #6 Left,Plantar Toe Great o Topical Lidocaine 4% cream applied to wound bed prior to debridement Primary Wound Dressing Wound #6 Left,Plantar Toe Great o Silvercel Non-Adherent Secondary Dressing Wound #6 Left,Plantar Toe Great o Dry Gauze o Other - coverlet or bandaid Dressing Change Frequency Wound #6 Left,Plantar Toe Great o Change dressing every other day. Follow-up Appointments Wound #6 Left,Plantar Toe Great o Return Appointment in 1 week. Edema Control Wound #6 Left,Plantar Toe Great o Elevate legs to the level of the heart and pump ankles as often as possible Off-Loading Wound #6 Left,Plantar Toe Great o Open toe surgical shoe with peg assist. Additional Orders / Instructions Wound #6 Left,Plantar Toe Great o Increase protein intake. Victor Daniels, Cadan R. ([id number]) Electronic  Signature(s)  Signed: 10/24/2017 4:29:50 PM By: Curtis Sites Signed: 10/24/2017 4:32:10 PM By: Baltazar Najjar MD Entered By: Curtis Sites on 10/24/2017 13:05:07 Victor Daniels ([id number]) -------------------------------------------------------------------------------- Problem List Details Patient Name: Victor Daniels, Victor Daniels. Date of Service: 10/24/2017 12:30 PM Medical Record Number: [id number] Patient Account Number: [id number] Date of Birth/Sex: 1946-12-15 (71 y.o. Male) Treating RN: Curtis Sites Primary Care Provider: Duncan Dull Other Clinician: Referring Provider: Duncan Dull Treating Provider/Extender: Altamese [place] in Treatment: 1 Active Problems ICD-10 Encounter Code Description Active Date Diagnosis L97.521 Non-pressure chronic ulcer of other part of left foot limited to 10/17/2017 Yes breakdown of skin E11.621 Type 2 diabetes mellitus with foot ulcer 10/17/2017 Yes E11.42 Type 2 diabetes mellitus with diabetic polyneuropathy 10/17/2017 Yes Inactive Problems Resolved Problems Electronic Signature(s) Signed: 10/24/2017 4:32:10 PM By: Baltazar Najjar MD Entered By: Baltazar Najjar on 10/24/2017 13:32:25 Victor Daniels ([id number]) -------------------------------------------------------------------------------- Progress Note Details Patient Name: Victor Daniels. Date of Service: 10/24/2017 12:30 PM Medical Record Number: [id number] Patient Account Number: [id number] Date of Birth/Sex: 09-07-1946 (71 y.o. Male) Treating RN: Curtis Sites Primary Care Provider: Duncan Dull Other Clinician: Referring Provider: Duncan Dull Treating Provider/Extender: Maxwell Caul Weeks in Treatment: 1 Subjective History of Present Illness (HPI) The following HPI elements were documented for the patient's wound: Location: Patient has bilateral venous stasis with ulcerations with inflammation of the left leg Severity: The wounds are  very clean and had been improving and measured smaller with silver collagen Duration: The patient has had problems with venous ulcers bilaterally from time to time for the last couple of years He returns today in followup of his left lower extremity open wound secondary to chronic venous insufficiency and ulceration. He removed his wraps yesterday as he was going to see his primary care physician. His notice some increased drainage from the wound and his primary care doctor increased his diuretics. He has no fevers or chills. He does have compression garments but he does not wear them because her too hard to get on. He is looking into obtaining some with [employer] that may be easier for him to put on. In the meantime he would like to continue with his compression wraps. READMISSION 09/05/16; this patient is a 71 year old man who was in this clinic on 2 visits 2 years ago in 2015. At that point he had wounds on his left leg felt to be secondary to chronic venous insufficiency with bilateral hemosiderin deposition. He ultimately was discharged with graded pressure stockings. The patient states he has stockings and is reasonably compliant with them we didn't come in with him on today. Is apparently a borderline diabetic not currently on any treatment undergoing nonpharmacologic therapy with diet etc. The patient states he is actually gained weight however. We month ago the patient started to notice more edema in both legs. The skin cracked open and he has had 3 draining areas on the left leg. He states that both legs drain so that night he'll often wake up with his lower pajama legs wet. He has not felt systemically unwell. He does have some shortness of breath but no chest pain. He tells me that he went to see Dr. dew of vascular surgery and had some tests done in his legs. He was told that nothing further could be done surgically for this. I don't have these notes order these tests at this point.  His ABIs in this clinic were noncompressible at 1.57 on the left and 1.53 on the right 09/12/16;  the patient had to take his wraps off on Sunday when he got the dressings wet. Nevertheless all his wound areas have closed there is no open area. He has severe bilateral venous insufficiency and has recently been to see Dr. dew Y haven't actually seen these records. Brought in the stockings he has these are 15-20 mmHg, this will be insufficient for him he will need at least 20-30 Hg perhaps 30-40s. We have given the address of elastic therapy and [place], with the measurements we should be able to get hematocrit compression by mail READMISSION 10//24/18; this is a 71 year old man who is a type II diabetic but is not on any current therapy other than diet. He has COPD on chronic oxygen a current nonsmoker. He tells me that about a month ago he was walking through a gait and managed to traumatize the plantar aspect of his left great toe. He developed a wound on the plantar aspect of the toe at the extreme base of the toe. He is been using topical antibiotics. We have seen him before in this clinic but predominantly related to chronic venous insufficiency wounds. He has seen Dr. dew in the past but I don't have any of those records at my fingertips. He has known noncompressible ABIs bilaterally. 10/24/17; this patient has a wound on his plantar left great toe right at the base of the toe. Aggressive debridement last week we have been using silver alginate and he thinks it is doing well Patient History Information obtained from Patient. Victor Daniels, SEELEY ([id number]) Family History Heart Disease - Father,Mother, Hypertension - Father,Mother, Stroke - SELF, Thyroid Problems - Mother, No family history of Cancer, Diabetes, Hereditary Spherocytosis, Kidney Disease, Lung Disease, Seizures, Tuberculosis. Social History Former smoker, Marital Status - Married, Alcohol Use - Never - quit, Drug Use - No  History, Caffeine Use - Daily - coffee, coke. Medical And Surgical History Notes Respiratory pneumonectomy of right lung Genitourinary have only one kidney Review of Systems (ROS) Constitutional Symptoms (General Health) The patient has no complaints or symptoms. Respiratory The patient has no complaints or symptoms. Cardiovascular The patient has no complaints or symptoms. Gastrointestinal The patient has no complaints or symptoms. Integumentary (Skin) Complains or has symptoms of Swelling - Patient states his lower extremity edema is a lot better. Objective Constitutional Sitting or standing Blood Pressure is within target range for patient.. Pulse regular and within target range for patient.Marland Kitchen Respirations regular, non-labored and within target range.. Temperature is normal and within the target range for the patient.Marland Kitchen appears in no distress. Vitals Time Taken: 12:44 PM, Height: 72 in, Weight: 295 lbs, BMI: 40, Temperature: 98.5 F, Pulse: 71 bpm, Respiratory Rate: 20 breaths/min, Blood Pressure: 121/72 mmHg. Eyes Conjunctivae clear. No discharge. Respiratory Respiratory effort is easy and symmetric bilaterally. Rate is normal at rest and on room air.. Cardiovascular Pedal pulses are reduced and very difficult to feel.. Severe chronic venous insufficiency but the edema is reasonably well- controlled. Lymphatic None palpable in the popliteal or inguinal area. Psychiatric MEER, REINDL ([id number]) No evidence of depression, anxiety, or agitation. Calm, cooperative, and communicative. Appropriate interactions and affect.. General Notes: Wound exam; plantar aspect of the left great toe right at the proximal end of it. Linear wound that looks a lot better than last week. There is some surrounding slough although this does not seem to interfere with the margins of the wound. No debridement was done today. Granulation looks better wound looks healthier no evidence of  infection Integumentary (  Hair, Skin) Changes of chronic venous insufficiency bilaterally in the lower extremities. Wound #6 status is Open. Original cause of wound was Trauma. The wound is located on the [employer]. The wound measures 0.4cm length x 1.7cm width x 0.2cm depth; 0.534cm^2 area and 0.107cm^3 volume. There is no tunneling or undermining noted. There is a large amount of serosanguineous drainage noted. The wound margin is distinct with the outline attached to the wound base. There is medium (34-66%) pink granulation within the wound bed. There is a medium (34-66%) amount of necrotic tissue within the wound bed including Eschar and Adherent Slough. The periwound skin appearance exhibited: Callus, Erythema. The surrounding wound skin color is noted with erythema which is circumferential. Periwound temperature was noted as No Abnormality. The periwound has tenderness on palpation. Assessment Active Problems ICD-10 L97.521 - Non-pressure chronic ulcer of other part of left foot limited to breakdown of skin E11.621 - Type 2 diabetes mellitus with foot ulcer E11.42 - Type 2 diabetes mellitus with diabetic polyneuropathy Plan Wound Cleansing: Wound #6 Left,Plantar Toe Great: Clean wound with Normal Saline. Cleanse wound with mild soap and water May shower with protection. Anesthetic: Wound #6 Left,Plantar Toe Great: Topical Lidocaine 4% cream applied to wound bed prior to debridement Primary Wound Dressing: Wound #6 Left,Plantar Toe Great: Silvercel Non-Adherent Secondary Dressing: Wound #6 Left,Plantar Toe Great: Dry Gauze Other - coverlet or bandaid Dressing Change Frequency: Wound #6 Left,Plantar Toe Great: Change dressing every other day. Follow-up Appointments: Wound #6 Left,Plantar Toe Great: Return Appointment in 1 week. HERSHEY, KNAUER ([id number]) Edema Control: Wound #6 Left,Plantar Toe Great: Elevate legs to the level of the heart and pump  ankles as often as possible Off-Loading: Wound #6 Left,Plantar Toe Great: Open toe surgical shoe with peg assist. Additional Orders / Instructions: Wound #6 Left,Plantar Toe Great: Increase protein intake. #1 we will continue with the silver alginate based dressings that the patient is changing himself #2 he has arterial studies around November 21 he thinks. #3 he has severe chronic venous insufficiency and has been seen in this clinic before for wounds in this area and has stockings at home nevertheless he is not wearing them. He states his edema is better Electronic Signature(s) Signed: 10/24/2017 4:32:10 PM By: Baltazar Najjar MD Entered By: Baltazar Najjar on 10/24/2017 13:38:25 Victor Daniels ([id number]) -------------------------------------------------------------------------------- ROS/PFSH Details Patient Name: LAUREANO, HETZER. Date of Service: 10/24/2017 12:30 PM Medical Record Number: [id number] Patient Account Number: [id number] Date of Birth/Sex: 1946-01-11 (71 y.o. Male) Treating RN: Curtis Sites Primary Care Provider: Duncan Dull Other Clinician: Referring Provider: Duncan Dull Treating Provider/Extender: Altamese [place] in Treatment: 1 Information Obtained From Patient Wound History Do you currently have one or more open woundso Yes How many open wounds do you currently haveo 1 Approximately how long have you had your woundso 1 month How have you been treating your wound(s) until nowo abx oint Has your wound(s) ever healed and then re-openedo No Have you had any lab work done in the past montho No Have you tested positive for an antibiotic resistant organism (MRSA, VRE)o No Have you tested positive for osteomyelitis (bone infection)o No Have you had any tests for circulation on your legso Yes Where was the test doneo [place] 6 yrs ago Have you had other problems associated with your woundso Swelling Integumentary  (Skin) Complaints and Symptoms: Positive for: Swelling - Patient states his lower extremity edema is a lot better Constitutional Symptoms (General Health) Complaints and Symptoms: No  Complaints or Symptoms Eyes Medical History: Positive for: Cataracts - removed Hematologic/Lymphatic Medical History: Positive for: Anemia Respiratory Complaints and Symptoms: No Complaints or Symptoms Medical History: Positive for: Asthma; Chronic Obstructive Pulmonary Disease (COPD) Negative for: Sleep Apnea Past Medical History Notes: pneumonectomy of right lung Cardiovascular THELMA, VIANA ([id number]) Complaints and Symptoms: No Complaints or Symptoms Medical History: Positive for: Arrhythmia; Coronary Artery Disease; Hypertension Gastrointestinal Complaints and Symptoms: No Complaints or Symptoms Endocrine Medical History: Positive for: Type II Diabetes Treated with: Diet Genitourinary Medical History: Past Medical History Notes: have only one kidney Musculoskeletal Medical History: Positive for: Osteoarthritis Oncologic Medical History: Negative for: Received Chemotherapy; Received Radiation HBO Extended History Items Eyes: Cataracts Immunizations Pneumococcal Vaccine: Received Pneumococcal Vaccination: Yes Immunization Notes: up to date Implantable Devices Family and Social History Cancer: No; Diabetes: No; Heart Disease: Yes - Father,Mother; Hereditary Spherocytosis: No; Hypertension: Yes - Father,Mother; Kidney Disease: No; Lung Disease: No; Seizures: No; Stroke: Yes - SELF; Thyroid Problems: Yes - Mother; Tuberculosis: No; Former smoker; Marital Status - Married; Alcohol Use: Never - quit; Drug Use: No History; Caffeine Use: Daily - coffee, coke; Financial Concerns: No; Food, Clothing or Shelter Needs: No; Support System Lacking: No; Transportation Concerns: No; Advanced Directives: No; Patient does not want information on Advanced Directives; Living Will:  No Electronic Signature(s) Signed: 10/24/2017 4:29:50 PM By: Curtis Sites Signed: 10/24/2017 4:32:10 PM By: Baltazar Najjar MD BULMARO, FEAGANS ([id number]) Entered By: Baltazar Najjar on 10/24/2017 13:35:16 Victor Daniels ([id number]) -------------------------------------------------------------------------------- SuperBill Details Patient Name: BENJAMYN, HESTAND. Date of Service: 10/24/2017 Medical Record Number: [id number] Patient Account Number: [id number] Date of Birth/Sex: 1946/07/28 (71 y.o. Male) Treating RN: Curtis Sites Primary Care Provider: Duncan Dull Other Clinician: Referring Provider: Duncan Dull Treating Provider/Extender: Maxwell Caul We[place] in Treatment: 1 Diagnosis Coding ICD-10 Codes Code Description [phone] Non-pressure chronic ulcer of other part of left foot limited to breakdown of skin E11.621 Type 2 diabetes mellitus with foot ulcer E11.42 Type 2 diabetes mellitus with diabetic polyneuropathy Facility Procedures CPT4 Code: [id number] Description: 99213 - WOUND CARE VISIT-LEV 3 EST PT Modifier: Quantity: 1 Physician Procedures CPT4 Code Description: [id number] 99213 - WC PHYS LEVEL 3 - EST PT ICD-10 Diagnosis Description L97.521 Non-pressure chronic ulcer of other part of left foot limited t E11.621 Type 2 diabetes mellitus with foot ulcer Modifier: o breakdown of sk Quantity: 1 in Electronic Signature(s) Signed: 10/24/2017 3:03:12 PM By: Curtis Sites Signed: 10/24/2017 4:32:10 PM By: Baltazar Najjar MD Entered By: Curtis Sites on 10/24/2017 15:03:11

## 2017-10-31 ENCOUNTER — Other Ambulatory Visit: Payer: Self-pay | Admitting: Internal Medicine

## 2017-10-31 ENCOUNTER — Encounter: Payer: Medicare HMO | Attending: Internal Medicine | Admitting: Internal Medicine

## 2017-10-31 DIAGNOSIS — E1142 Type 2 diabetes mellitus with diabetic polyneuropathy: Secondary | ICD-10-CM | POA: Insufficient documentation

## 2017-10-31 DIAGNOSIS — Z9981 Dependence on supplemental oxygen: Secondary | ICD-10-CM | POA: Diagnosis not present

## 2017-10-31 DIAGNOSIS — L97521 Non-pressure chronic ulcer of other part of left foot limited to breakdown of skin: Secondary | ICD-10-CM | POA: Diagnosis not present

## 2017-10-31 DIAGNOSIS — J449 Chronic obstructive pulmonary disease, unspecified: Secondary | ICD-10-CM | POA: Diagnosis not present

## 2017-10-31 DIAGNOSIS — E11621 Type 2 diabetes mellitus with foot ulcer: Secondary | ICD-10-CM | POA: Insufficient documentation

## 2017-10-31 DIAGNOSIS — L97522 Non-pressure chronic ulcer of other part of left foot with fat layer exposed: Secondary | ICD-10-CM | POA: Diagnosis not present

## 2017-10-31 DIAGNOSIS — I872 Venous insufficiency (chronic) (peripheral): Secondary | ICD-10-CM | POA: Diagnosis not present

## 2017-11-02 NOTE — Progress Notes (Signed)
Victor Daniels ([id number]) Visit Report for 10/31/2017 HPI Details Patient Name: Victor Daniels, Victor Daniels. Date of Service: 10/31/2017 1:45 PM Medical Record Number: [id number] Patient Account Number: [id number] Date of Birth/Sex: [birth year] (71 y.o. Male) Treating RN: Huel Coventry Primary Care Provider: Duncan Dull Other Clinician: Referring Provider: Duncan Dull Treating Provider/Extender: Altamese Loving in Treatment: 2 History of Present Illness Location: Patient has bilateral venous stasis with ulcerations with inflammation of the left leg Severity: The wounds are very clean and had been improving and measured smaller with silver collagen Duration: The patient has had problems with venous ulcers bilaterally from time to time for the last couple of years HPI Description: He returns today in followup of his left lower extremity open wound secondary to chronic venous insufficiency and ulceration. He removed his wraps yesterday as he was going to see his primary care physician. His notice some increased drainage from the wound and his primary care doctor increased his diuretics. He has no fevers or chills. He does have compression garments but he does not wear them because her too hard to get on. He is looking into obtaining some with [employer] sides that may be easier for him to put on. In the meantime he would like to continue with his compression wraps. READMISSION 09/05/16; this patient is a 71 year old man who was in this clinic on 2 visits 2 years ago in 2015. At that point he had wounds on his left leg felt to be secondary to chronic venous insufficiency with bilateral hemosiderin deposition. He ultimately was discharged with graded pressure stockings. The patient states he has stockings and is reasonably compliant with them we didn't come in with him on today. Is apparently a borderline diabetic not currently on any treatment undergoing nonpharmacologic therapy with  diet etc. The patient states he is actually gained weight however. We month ago the patient started to notice more edema in both legs. The skin cracked open and he has had 3 draining areas on the left leg. He states that both legs drain so that night he'll often wake up with his lower pajama legs wet. He has not felt systemically unwell. He does have some shortness of breath but no chest pain. He tells me that he went to see Dr. dew of vascular surgery and had some tests done in his legs. He was told that nothing further could be done surgically for this. I don't have these notes order these tests at this point. His ABIs in this clinic were noncompressible at 1.57 on the left and 1.53 on the right 09/12/16; the patient had to take his wraps off on Sunday when he got the dressings wet. Nevertheless all his wound areas have closed there is no open area. He has severe bilateral venous insufficiency and has recently been to see Dr. dew Y haven't actually seen these records. Brought in the stockings he has these are 15-20 mmHg, this will be insufficient for him he will need at least 20-30 Hg perhaps 30-40s. We have given the address of elastic therapy and [place], with the measurements we should be able to get hematocrit compression by mail READMISSION 10//24/18; this is a 71 year old man who is a type II diabetic but is not on any current therapy other than diet. He has COPD on chronic oxygen a current nonsmoker. He tells me that about a month ago he was walking through a gait and managed to traumatize the plantar aspect of his left great toe. He developed  a wound on the plantar aspect of the toe at the extreme base of the toe. He is been using topical antibiotics. We have seen him before in this clinic but predominantly related to chronic venous insufficiency wounds. He has seen Dr. dew in the past but I don't have any of those records at my fingertips. He has known noncompressible ABIs  bilaterally. 10/24/17; this patient has a wound on his plantar left great toe right at the base of the toe. Aggressive debridement last week we have been using silver alginate and he thinks it is doing well 10/31/17; the patient's wound on his plantar left great toe proximally has totally healed. No debridement is necessary. I think this is going to be an issue a pressure relief. I have ordered arterial studies which are booked for 11/21. If these come to my inbox I'll call the patient with results.. He is a type II diabetic and has recurrent wounds Victor Daniels, Huntington R. ([id number]) Electronic Signature(s) Signed: 10/31/2017 5:39:07 PM By: Baltazar Najjarobson, Michael MD Entered By: Baltazar Najjarobson, Michael on 10/31/2017 17:35:43 Victor Daniels, Victor R. ([id number]) -------------------------------------------------------------------------------- Physical Exam Details Patient Name: Victor Daniels, Khyson R. Date of Service: 10/31/2017 1:45 PM Medical Record Number: [id number] Patient Account Number: [id number] Date of Birth/Sex: November 09, 1946 14(71 y.o. Male) Treating RN: Huel CoventryWoody, Kim Primary Care Provider: Duncan Dullullo, Teresa Other Clinician: Referring Provider: Duncan Dullullo, Teresa Treating Provider/Extender: Maxwell CaulOBSON, MICHAEL G Weeks in Treatment: 2 Cardiovascular dorsalis pedis pulses are reduced but palp. minimal edema, chronic venous insufficiency. Notes wound exam; plantar aspect of the left great toe at the proximal part of this just distal to the MTP joint is totally healed. No debridement is necessary. Electronic Signature(s) Signed: 10/31/2017 5:39:07 PM By: Baltazar Najjarobson, Michael MD Entered By: Baltazar Najjarobson, Michael on 10/31/2017 17:36:36 Victor Daniels, Aksel R. ([id number]) -------------------------------------------------------------------------------- Physician Orders Details Patient Name: Victor Daniels, Easton R. Date of Service: 10/31/2017 1:45 PM Medical Record Number: [id number] Patient Account Number: [id number] Date of  Birth/Sex: November 09, 1946 [age] y.o. Male) Treating RN: Renne CriglerFlinchum, Cheryl Primary Care Provider: Duncan Dullullo, Teresa Other Clinician: Referring Provider: Duncan Dullullo, Teresa Treating Provider/Extender: Altamese CarolinaOBSON, MICHAEL G Weeks in Treatment: 2 Verbal / Phone Orders: No Diagnosis [facility] - treatment complete Electronic Signature(s) Signed: 10/31/2017 4:21:40 PM By: Renne CriglerFlinchum, Cheryl Signed: 10/31/2017 5:39:07 PM By: Baltazar Najjarobson, Michael MD Entered By: Renne CriglerFlinchum, Cheryl on 10/31/2017 14:02:53 Victor Daniels, Samin R. ([id number]) -------------------------------------------------------------------------------- Problem List Details Patient Name: Victor Daniels, Jansen R. Date of Service: 10/31/2017 1:45 PM Medical Record Number: [id number] Patient Account Number: [id number] Date of Birth/Sex: November 09, 1946 [age] y.o. Male) Treating RN: Huel CoventryWoody, Kim Primary Care Provider: Duncan Dullullo, Teresa Other Clinician: Referring Provider: Duncan Dullullo, Teresa Treating Provider/Extender: Altamese CarolinaOBSON, MICHAEL G Weeks in Treatment: 2 Active Problems ICD-10 Encounter Code Description Active Date Diagnosis L97.521 Non-pressure chronic ulcer of other part of left foot limited to 10/17/2017 Yes breakdown of skin E11.621 Type 2 diabetes mellitus with foot ulcer 10/17/2017 Yes E11.42 Type 2 diabetes mellitus with diabetic polyneuropathy 10/17/2017 Yes Inactive Problems Resolved Problems Electronic Signature(s) Signed: 10/31/2017 5:39:07 PM By: Baltazar Najjarobson, Michael MD Entered By: Baltazar Najjarobson, Michael on 10/31/2017 17:34:27 Victor Daniels, Esli R. ([id number]) -------------------------------------------------------------------------------- Progress Note Details Patient Name: Victor Daniels, Keone R. Date of Service: 10/31/2017 1:45 PM Medical Record Number: [id number] Patient Account Number: [id number] Date of Birth/Sex: November 09, 1946 [age] y.o. Male) Treating RN: Huel CoventryWoody, Kim Primary Care Provider: Duncan Dullullo, Teresa  Other Clinician: Referring Provider: Duncan Dullullo, Teresa Treating Provider/Extender: Maxwell CaulOBSON, MICHAEL G Weeks in Treatment: 2 Subjective History of Present Illness (HPI) The following HPI elements were documented for  the patient's wound: Location: Patient has bilateral venous stasis with ulcerations with inflammation of the left leg Severity: The wounds are very clean and had been improving and measured smaller with silver collagen Duration: The patient has had problems with venous ulcers bilaterally from time to time for the last couple of years He returns today in followup of his left lower extremity open wound secondary to chronic venous insufficiency and ulceration. He removed his wraps yesterday as he was going to see his primary care physician. His notice some increased drainage from the wound and his primary care doctor increased his diuretics. He has no fevers or chills. He does have compression garments but he does not wear them because her too hard to get on. He is looking into obtaining some with [employer] that may be easier for him to put on. In the meantime he would like to continue with his compression wraps. READMISSION 09/05/16; this patient is a 71 year old man who was in this clinic on 2 visits 2 years ago in 2015. At that point he had wounds on his left leg felt to be secondary to chronic venous insufficiency with bilateral hemosiderin deposition. He ultimately was discharged with graded pressure stockings. The patient states he has stockings and is reasonably compliant with them we didn't come in with him on today. Is apparently a borderline diabetic not currently on any treatment undergoing nonpharmacologic therapy with diet etc. The patient states he is actually gained weight however. We month ago the patient started to notice more edema in both legs. The skin cracked open and he has had 3 draining areas on the left leg. He states that both legs drain so that night he'll often  wake up with his lower pajama legs wet. He has not felt systemically unwell. He does have some shortness of breath but no chest pain. He tells me that he went to see Dr. dew of vascular surgery and had some tests done in his legs. He was told that nothing further could be done surgically for this. I don't have these notes order these tests at this point. His ABIs in this clinic were noncompressible at 1.57 on the left and 1.53 on the right 09/12/16; the patient had to take his wraps off on Sunday when he got the dressings wet. Nevertheless all his wound areas have closed there is no open area. He has severe bilateral venous insufficiency and has recently been to see Dr. dew Y haven't actually seen these records. Brought in the stockings he has these are 15-20 mmHg, this will be insufficient for him he will need at least 20-30 Hg perhaps 30-40s. We have given the address of elastic therapy and [place], with the measurements we should be able to get hematocrit compression by mail READMISSION 10//24/18; this is a 71 year old man who is a type II diabetic but is not on any current therapy other than diet. He has COPD on chronic oxygen a current nonsmoker. He tells me that about a month ago he was walking through a gait and managed to traumatize the plantar aspect of his left great toe. He developed a wound on the plantar aspect of the toe at the extreme base of the toe. He is been using topical antibiotics. We have seen him before in this clinic but predominantly related to chronic venous insufficiency wounds. He has seen Dr. dew in the past but I don't have any of those records at my fingertips. He has known noncompressible ABIs bilaterally.  10/24/17; this patient has a wound on his plantar left great toe right at the base of the toe. Aggressive debridement last week we have been using silver alginate and he thinks it is doing well 10/31/17; the patient's wound on his plantar left great toe proximally  has totally healed. No debridement is necessary. I think this is going to be an issue a pressure relief. I have ordered arterial studies which are booked for 11/21. If these come to my inbox I'll call the patient with results.. He is a type II diabetic and has recurrent wounds YASSER, HEPP. ([id number]) Objective Constitutional Vitals Time Taken: 1:51 AM, Height: 72 in, Weight: 295 lbs, BMI: 40, Temperature: 98.6 F, Pulse: 77 bpm, Respiratory Rate: 20 breaths/min, Blood Pressure: 134/74 mmHg. Cardiovascular dorsalis pedis pulses are reduced but palp. minimal edema, chronic venous insufficiency. General Notes: wound exam; plantar aspect of the left great toe at the proximal part of this just distal to the MTP joint is totally healed. No debridement is necessary. Integumentary (Hair, Skin) Wound #6 status is Healed - Epithelialized. Original cause of wound was Trauma. The wound is located on the [employer]. The wound measures 0cm length x 0cm width x 0cm depth; 0cm^2 area and 0cm^3 volume. The wound is limited to skin breakdown. There is no tunneling or undermining noted. There is a none present amount of drainage noted. The wound margin is distinct with the outline attached to the wound base. There is large (67-100%) pink granulation within the wound bed. There is no necrotic tissue within the wound bed. The periwound skin appearance exhibited: Callus, Erythema. The surrounding wound skin color is noted with erythema which is circumferential. Periwound temperature was noted as No Abnormality. The periwound has tenderness on palpation. 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 thick band aids, callus pads etc. All of this was discussed #3 when his arterial studies come back all tried it update the patient with the results by phone #4 at some point he'll likely require reflux studies if he has wounds on his lower extremities. He is not wearing stockings although I think he does have that Electronic Signature(s) Signed: 10/31/2017 5:39:07 PM By: Baltazar Najjar MD Entered By: Baltazar Najjar on 10/31/2017 17:37:40 Victor Kemp ([id number]) -------------------------------------------------------------------------------- SuperBill Details Patient Name: Victor Kemp. Date of Service: 10/31/2017 Medical Record Number: [id number] Patient Account Number: [id number] Date of Birth/Sex: [birth year] (71 y.o. Male) Treating RN: Renne Crigler Primary Care Provider: Duncan Dull Other Clinician: Referring Provider: Duncan Dull Treating Provider/Extender: Maxwell Caul Weeks in Treatment: 2 Diagnosis Coding ICD-10 Codes Code Description [phone] Non-pressure chronic ulcer of other part of left foot limited to breakdown of skin E11.621 Type 2 diabetes mellitus with foot ulcer E11.42 Type 2 diabetes mellitus with diabetic polyneuropathy Facility Procedures CPT4 Code: [id number] Description: [phone] - WOUND CARE VISIT-LEV 2 EST PT Modifier: Quantity: 1 Physician Procedures CPT4 Code Description: [id number] - WC PHYS LEVEL 2 - EST PT ICD-10 Diagnosis Description L97.521 Non-pressure chronic ulcer of other part of left foot limited t E11.621 Type 2  diabetes mellitus with foot ulcer Modifier: o breakdown of sk Quantity: 1 in Electronic Signature(s) Signed: 10/31/2017 5:39:07 PM By: Baltazar Najjarobson, Michael MD Previous Signature: 10/31/2017 4:21:40 PM Version By: Renne CriglerFlinchum,  Cheryl Entered By: Baltazar Najjarobson, Michael on 10/31/2017 17:38:01

## 2017-11-02 NOTE — Progress Notes (Signed)
JERMARION, POFFENBERGER ([id number]) Visit Report for 10/31/2017 Arrival Information Details Patient Name: Victor Daniels, Victor Daniels. Date of Service: 10/31/2017 1:45 PM Medical Record Number: [id number] Patient Account Number: [id number] Date of Birth/Sex: Nov 08, 1946 (71 y.o. Male) Treating RN: Renne Crigler Primary Care Moreen Piggott: Duncan Dull Other Clinician: Referring Zyiah Withington: Duncan Dull Treating Sherrilyn Nairn/Extender: Altamese [place]offord in Treatment: 2 Visit Information History Since Last Visit All ordered tests and consults were completed: No Patient Arrived: Ambulatory Added or deleted any medications: No Arrival Time: 13:43 Any new allergies or adverse reactions: No Accompanied By: self Had a fall or experienced change in No Transfer Assistance: None activities of daily living that may affect Patient Identification Verified: Yes risk of falls: Secondary Verification Process Completed: Yes Signs or symptoms of abuse/neglect since last visito No Patient Requires Transmission-Based No Hospitalized since last visit: No Precautions: Pain Present Now: No Patient Has Alerts: No Electronic Signature(s) Signed: 10/31/2017 4:21:40 PM By: Renne Crigler Entered By: Renne Crigler on 10/31/2017 13:47:21 Victor Daniels ([id number]) -------------------------------------------------------------------------------- Clinic Level of Care Assessment Details Patient Name: Victor Daniels. Date of Service: 10/31/2017 1:45 PM Medical Record Number: [id number] Patient Account Number: [id number] Date of Birth/Sex: [birth year] (71 y.o. Male) Treating RN: Renne Crigler Primary Care Astoria Condon: Duncan Dull Other Clinician: Referring Julius Matus: Duncan Dull Treating Ethelean Colla/Extender: Altamese [place] in Treatment: 2 Clinic Level of Care Assessment Items TOOL 4 Quantity Score []  - Use when only an EandM is performed on FOLLOW-UP visit 0 ASSESSMENTS - Nursing  Assessment / Reassessment []  - Reassessment of Co-morbidities (includes updates in patient status) 0 X- 1 5 Reassessment of Adherence to Treatment Plan ASSESSMENTS - Wound and Skin Assessment / Reassessment X - Simple Wound Assessment / Reassessment - one wound 1 5 []  - 0 Complex Wound Assessment / Reassessment - multiple wounds []  - 0 Dermatologic / Skin Assessment (not related to wound area) ASSESSMENTS - Focused Assessment []  - Circumferential Edema Measurements - multi extremities 0 []  - 0 Nutritional Assessment / Counseling / Intervention []  - 0 Lower Extremity Assessment (monofilament, tuning fork, pulses) []  - 0 Peripheral Arterial Disease Assessment (using hand held doppler) ASSESSMENTS - Ostomy and/or Continence Assessment and Care []  - Incontinence Assessment and Management 0 []  - 0 Ostomy Care Assessment and Management (repouching, etc.) PROCESS - Coordination of Care X - Simple Patient / Family Education for ongoing care 1 15 []  - 0 Complex (extensive) Patient / Family Education for ongoing care []  - 0 Staff obtains Chiropractor, Records, Test Results / Process Orders []  - 0 Staff telephones HHA, Nursing Homes / Clarify orders / etc []  - 0 Routine Transfer to another Facility (non-emergent condition) []  - 0 Routine Hospital Admission (non-emergent condition) []  - 0 New Admissions / Manufacturing engineer / Ordering NPWT, Apligraf, etc. []  - 0 Emergency Hospital Admission (emergent condition) X- 1 10 Simple Discharge Coordination ARIK, HUSMANN ([id number]) []  - 0 Complex (extensive) Discharge Coordination PROCESS - Special Needs []  - Pediatric / Minor Patient Management 0 []  - 0 Isolation Patient Management []  - 0 Hearing / Language / Visual special needs []  - 0 Assessment of Community assistance (transportation, D/C planning, etc.) []  - 0 Additional assistance / Altered mentation []  - 0 Support Surface(s) Assessment (bed, cushion, seat,  etc.) INTERVENTIONS - Wound Cleansing / Measurement X - Simple Wound Cleansing - one wound 1 5 []  - 0 Complex Wound Cleansing - multiple wounds []  - 0 Wound Imaging (photographs - any number of wounds) []  - 0 Wound  Tracing (instead of photographs) X- 1 5 Simple Wound Measurement - one wound []  - 0 Complex Wound Measurement - multiple wounds INTERVENTIONS - Wound Dressings []  - Small Wound Dressing one or multiple wounds 0 []  - 0 Medium Wound Dressing one or multiple wounds []  - 0 Large Wound Dressing one or multiple wounds []  - 0 Application of Medications - topical []  - 0 Application of Medications - injection INTERVENTIONS - Miscellaneous []  - External ear exam 0 []  - 0 Specimen Collection (cultures, biopsies, blood, body fluids, etc.) []  - 0 Specimen(s) / Culture(s) sent or taken to Lab for analysis []  - 0 Patient Transfer (multiple staff / Nurse, adultHoyer Lift / Similar devices) []  - 0 Simple Staple / Suture removal (25 or less) []  - 0 Complex Staple / Suture removal (26 or more) []  - 0 Hypo / Hyperglycemic Management (close monitor of Blood Glucose) []  - 0 Ankle / Brachial Index (ABI) - do not check if billed separately X- 1 5 Vital Signs Victor KempKIRCHGESSNER, Victor R. ([id number]) Has the patient been seen at the hospital within the last three years: Yes Total Score: 50 Level Of Care: New/Established - Level 2 Electronic Signature(s) Signed: 10/31/2017 4:21:40 PM By: Renne CriglerFlinchum, Cheryl Entered By: Renne CriglerFlinchum, Cheryl on 10/31/2017 14:05:41 Victor KempKIRCHGESSNER, Victor R. ([id number]) -------------------------------------------------------------------------------- Encounter Discharge Information Details Patient Name: Victor KempKIRCHGESSNER, Victor R. Date of Service: 10/31/2017 1:45 PM Medical Record Number: [id number] Patient Account Number: [id number] Date of Birth/Sex: 11-Jun-1946 [age] y.o. Male) Treating RN: Huel CoventryWoody, Kim Primary Care Dilana Mcphie: Duncan Dullullo, Teresa Other Clinician: Referring Aliannah Holstrom:  Duncan Dullullo, Teresa Treating Chamille Werntz/Extender: Altamese CarolinaOBSON, MICHAEL G Weeks in Treatment: 2 Encounter Discharge Information Items Discharge Pain Level: 0 Discharge Condition: Stable Ambulatory Status: Ambulatory Discharge Destination: Home Transportation: Private Auto Accompanied By: self Schedule Follow-up Appointment: No Medication Reconciliation completed and No provided to Patient/Care Ciria Bernardini: Provided on Clinical Summary of Care: 10/31/2017 Form Type Recipient Paper Patient EK Electronic Signature(s) Signed: 10/31/2017 4:21:40 PM By: Renne CriglerFlinchum, Cheryl Entered By: Renne CriglerFlinchum, Cheryl on 10/31/2017 14:06:52 Victor KempKIRCHGESSNER, Victor R. ([id number]) -------------------------------------------------------------------------------- Lower Extremity Assessment Details Patient Name: Victor KempKIRCHGESSNER, Victor R. Date of Service: 10/31/2017 1:45 PM Medical Record Number: [id number] Patient Account Number: [id number] Date of Birth/Sex: 11-Jun-1946 [age] y.o. Male) Treating RN: Renne CriglerFlinchum, Cheryl Primary Care Itzy Adler: Duncan Dullullo, Teresa Other Clinician: Referring Sione Baumgarten: Duncan Dullullo, Teresa Treating Maudell Stanbrough/Extender: Maxwell CaulOBSON, MICHAEL G Weeks in Treatment: 2 Edema Assessment Assessed: [Left: No] [Right: No] Edema: [Left: Ye] [Right: s] Vascular Assessment Claudication: Claudication Assessment [Left:None] Pulses: Dorsalis Pedis Palpable: [Left:Yes] Posterior Tibial Extremity colors, hair growth, and conditions: Extremity Color: [Left:Hyperpigmented] Hair Growth on Extremity: [Left:No] Temperature of Extremity: [Left:Warm] Capillary Refill: [Left:> 3 seconds] Toe Nail Assessment Left: Right: Thick: Yes Discolored: Yes Deformed: Yes Improper Length and Hygiene: Yes Electronic Signature(s) Signed: 10/31/2017 4:21:40 PM By: Renne CriglerFlinchum, Cheryl Entered By: Renne CriglerFlinchum, Cheryl on 10/31/2017 13:53:05 Victor KempKIRCHGESSNER, Victor R.  ([id number]) -------------------------------------------------------------------------------- Multi Wound Chart Details Patient Name: Victor KempKIRCHGESSNER, Victor R. Date of Service: 10/31/2017 1:45 PM Medical Record Number: [id number] Patient Account Number: [id number] Date of Birth/Sex: 11-Jun-1946 [age] y.o. Male) Treating RN: Renne CriglerFlinchum, Cheryl Primary Care Shaddai Shapley: Duncan Dullullo, Teresa Other Clinician: Referring Obediah Welles: Duncan Dullullo, Teresa Treating Demiah Gullickson/Extender: Altamese CarolinaOBSON, MICHAEL G Weeks in Treatment: 2 Vital Signs Height(in): 72 Pulse(bpm): 77 Weight(lbs): 295 Blood Pressure(mmHg): 134/74 Body Mass Index(BMI): 40 Temperature(F): 98.6 Respiratory Rate 20 (breaths/min): Photos: [N/A:N/A] Wound Location: Left, Plantar Toe Great N/A N/A Wounding Event: Trauma N/A N/A Primary Etiology: Trauma, Other N/A N/A Comorbid History: Cataracts, Anemia, Asthma, N/A N/A Chronic Obstructive Pulmonary Disease (COPD), Arrhythmia, Coronary Artery Disease, Hypertension, Type  II Diabetes, Osteoarthritis Date Acquired: 09/17/2017 N/A N/A Weeks of Treatment: 2 N/A N/A Wound Status: Healed - Epithelialized N/A N/A Measurements L x W x D 0x0x0 N/A N/A (cm) Area (cm) : 0 N/A N/A Volume (cm) : 0 N/A N/A % Reduction in Area: 100.00% N/A N/A % Reduction in Volume: 100.00% N/A N/A Classification: Partial Thickness N/A N/A Exudate Amount: None Present N/A N/A Wound Margin: Distinct, outline attached N/A N/A Granulation Amount: Large (67-100%) N/A N/A Granulation Quality: Pink N/A N/A Necrotic Amount: None Present (0%) N/A N/A Exposed Structures: Fascia: No N/A N/A Fat Layer (Subcutaneous Tissue) Exposed: No Tendon: No Muscle: No Joint: No TYRION, GLAUDE ([id number]) Bone: No Limited to Skin Breakdown Epithelialization: Large (67-100%) N/A N/A Periwound Skin Texture: Callus: Yes N/A N/A Periwound Skin Moisture: No Abnormalities Noted N/A N/A Periwound Skin Color: Erythema: Yes N/A  N/A Erythema Location: Circumferential N/A N/A Temperature: No Abnormality N/A N/A Tenderness on Palpation: Yes N/A N/A Wound Preparation: Ulcer Cleansing: N/A N/A Rinsed/Irrigated with Saline Topical Anesthetic Applied: Other: lidocaine 4% Treatment Notes Electronic Signature(s) Signed: 10/31/2017 5:39:07 PM By: Baltazar Najjar MD Previous Signature: 10/31/2017 4:21:40 PM Version By: Renne Crigler Entered By: Baltazar Najjar on 10/31/2017 17:34:46 Victor Daniels ([id number]) -------------------------------------------------------------------------------- Multi-Disciplinary Care Plan Details Patient Name: SEMAJE, KINKER. Date of Service: 10/31/2017 1:45 PM Medical Record Number: [id number] Patient Account Number: [id number] Date of Birth/Sex: 1946/01/21 (71 y.o. Male) Treating RN: Renne Crigler Primary Care Cleo Santucci: Duncan Dull Other Clinician: Referring Allisson Schindel: Duncan Dull Treating Caleesi Kohl/Extender: Maxwell Caul Weeks in Treatment: 2 Active Inactive Electronic Signature(s) Signed: 10/31/2017 2:17:29 PM By: Renne Crigler Entered By: Renne Crigler on 10/31/2017 14:17:28 Victor Daniels ([id number]) -------------------------------------------------------------------------------- Pain Assessment Details Patient Name: RODERT, HINCH. Date of Service: 10/31/2017 1:45 PM Medical Record Number: [id number] Patient Account Number: [id number] Date of Birth/Sex: 1946/12/20 (71 y.o. Male) Treating RN: Renne Crigler Primary Care Tora Prunty: Duncan Dull Other Clinician: Referring Trejuan Matherne: Duncan Dull Treating Isebella Upshur/Extender: Maxwell Caul Weeks in Treatment: 2 Active Problems Location of Pain Severity and Description of Pain Patient Has Paino No Site Locations Pain Management and Medication Current Pain Management: Electronic Signature(s) Signed: 10/31/2017 4:21:40 PM By: Renne Crigler Entered By: Renne Crigler on  10/31/2017 13:47:40 Victor Daniels ([id number]) -------------------------------------------------------------------------------- Patient/Caregiver Education Details Patient Name: WILFERD, RITSON. Date of Service: 10/31/2017 1:45 PM Medical Record Number: [id number] Patient Account Number: [id number] Date of Birth/Gender: Feb 09, 1946 (71 y.o. Male) Treating RN: Renne Crigler Primary Care Physician: Duncan Dull Other Clinician: Referring Physician: Duncan Dull Treating Physician/Extender: Altamese Ear[place]art in Treatment: 2 Education Assessment Education Provided To: Patient Education Topics Provided Notes Keep pressure off left great toe and follow with vascular appt Electronic Signature(s) Signed: 10/31/2017 4:21:40 PM By: Renne Crigler Entered By: Renne Crigler on 10/31/2017 14:07:37 Victor Daniels ([id number]) -------------------------------------------------------------------------------- Wound Assessment Details Patient Name: LEA, WALBERT. Date of Service: 10/31/2017 1:45 PM Medical Record Number: [id number] Patient Account Number: [id number] Date of Birth/Sex: 12-12-1946 (71 y.o. Male) Treating RN: Renne Crigler Primary Care Shivonne Schwartzman: Duncan Dull Other Clinician: Referring Florian Chauca: Duncan Dull Treating Jaaliyah Lucatero/Extender: Maxwell Caul Weeks in Treatment: 2 Wound Status Wound Number: 6 Primary Trauma, Other Etiology: Wound Location: Left, Plantar Toe Great Wound Healed - Epithelialized Wounding Event: Trauma Status: Date Acquired: 09/17/2017 Comorbid Cataracts, Anemia, Asthma, Chronic Obstructive Weeks Of Treatment: 2 History: Pulmonary Disease (COPD), Arrhythmia, Clustered Wound: No Coronary Artery Disease, Hypertension, Type II Diabetes, Osteoarthritis Photos Wound Measurements Length: (cm) 0 % R Width: (cm) 0 %  R Depth: (cm) 0 Epi Area: (cm) 0 Tu Volume: (cm) 0 Un eduction in Area: 100% eduction  in Volume: 100% thelialization: Large (67-100%) nneling: No dermining: No Wound Description Classification: Partial Thickness Wound Margin: Distinct, outline attached Exudate Amount: None Present Foul Odor After Cleansing: No Slough/Fibrino No Wound Bed Granulation Amount: Large (67-100%) Exposed Structure Granulation Quality: Pink Fascia Exposed: No Necrotic Amount: None Present (0%) Fat Layer (Subcutaneous Tissue) Exposed: No Tendon Exposed: No Muscle Exposed: No Joint Exposed: No Bone Exposed: No Limited to Skin Breakdown Periwound Skin Texture Texture Color No Abnormalities Noted: No No Abnormalities Noted: No Callus: Yes Erythema: Yes Victor KempKIRCHGESSNER, Wadsworth R. ([id number]) Moisture Erythema Location: Circumferential No Abnormalities Noted: No Temperature / Pain Temperature: No Abnormality Tenderness on Palpation: Yes Wound Preparation Ulcer Cleansing: Rinsed/Irrigated with Saline Topical Anesthetic Applied: Other: lidocaine 4%, Electronic Signature(s) Signed: 10/31/2017 4:21:40 PM By: Renne CriglerFlinchum, Cheryl Entered By: Renne CriglerFlinchum, Cheryl on 10/31/2017 14:01:26 Victor KempKIRCHGESSNER, Vincent R. ([id number]) -------------------------------------------------------------------------------- Vitals Details Patient Name: Victor KempKIRCHGESSNER, Jhonathan R. Date of Service: 10/31/2017 1:45 PM Medical Record Number: [id number] Patient Account Number: [id number] Date of Birth/Sex: [birth year] [age] y.o. Male) Treating RN: Renne CriglerFlinchum, Cheryl Primary Care Kazuto Sevey: Duncan Dullullo, Teresa Other Clinician: Referring Maryella Abood: Duncan Dullullo, Teresa Treating Daiel Strohecker/Extender: Altamese CarolinaOBSON, MICHAEL G Weeks in Treatment: 2 Vital Signs Time Taken: 01:51 Temperature (F): 98.6 Height (in): 72 Pulse (bpm): 77 Weight (lbs): 295 Respiratory Rate (breaths/min): 20 Body Mass Index (BMI): 40 Blood Pressure (mmHg): 134/74 Reference Range: 80 - 120 mg / dl Electronic Signature(s) Signed: 10/31/2017 4:21:40 PM By: Renne CriglerFlinchum, Cheryl Entered  By: Renne CriglerFlinchum, Cheryl on 10/31/2017 13:51:41

## 2017-11-06 ENCOUNTER — Telehealth: Payer: Self-pay | Admitting: Internal Medicine

## 2017-11-06 DIAGNOSIS — J449 Chronic obstructive pulmonary disease, unspecified: Secondary | ICD-10-CM | POA: Diagnosis not present

## 2017-11-06 MED ORDER — CARVEDILOL 12.5 MG PO TABS: ORAL_TABLET | ORAL | 3 refills | Status: DC

## 2017-11-06 NOTE — Telephone Encounter (Signed)
Copied from CRM [id number]. Topic: Quick Communication - See Telephone Encounter >> Nov 06, 2017  2:20 PM Louie BunPalacios Medina, Rosey Batheresa D wrote: CRM for notification. See Telephone encounter for: 11/06/17. Patient needs refill on his carvedilol (COREG) 12.5 MG tablet sen to his [facility].

## 2017-11-08 ENCOUNTER — Other Ambulatory Visit: Payer: Self-pay | Admitting: Internal Medicine

## 2017-11-12 DIAGNOSIS — J449 Chronic obstructive pulmonary disease, unspecified: Secondary | ICD-10-CM | POA: Diagnosis not present

## 2017-11-14 ENCOUNTER — Encounter (INDEPENDENT_AMBULATORY_CARE_PROVIDER_SITE_OTHER): Payer: Self-pay | Admitting: Vascular Surgery

## 2017-11-14 ENCOUNTER — Encounter (INDEPENDENT_AMBULATORY_CARE_PROVIDER_SITE_OTHER): Payer: Self-pay

## 2017-12-03 ENCOUNTER — Ambulatory Visit: Payer: Medicare HMO | Admitting: Pulmonary Disease

## 2017-12-05 ENCOUNTER — Telehealth: Payer: Self-pay | Admitting: Pulmonary Disease

## 2017-12-05 NOTE — Telephone Encounter (Signed)
Second attempt   lmov for patient need to reschedule appointment that was on 12/03/17 with Dr Sung AmabileSimonds Due to weather  Will await call back from patient to reschedule.

## 2017-12-06 DIAGNOSIS — J449 Chronic obstructive pulmonary disease, unspecified: Secondary | ICD-10-CM | POA: Diagnosis not present

## 2017-12-12 ENCOUNTER — Encounter: Payer: Self-pay | Admitting: Internal Medicine

## 2017-12-12 ENCOUNTER — Ambulatory Visit: Payer: Medicare HMO | Admitting: Internal Medicine

## 2017-12-12 VITALS — BP 132/76 | HR 75 | Temp 98.3°F | Resp 17 | Ht 72.0 in | Wt 292.8 lb

## 2017-12-12 DIAGNOSIS — G8929 Other chronic pain: Secondary | ICD-10-CM

## 2017-12-12 DIAGNOSIS — M25512 Pain in left shoulder: Secondary | ICD-10-CM

## 2017-12-12 DIAGNOSIS — J9622 Acute and chronic respiratory failure with hypercapnia: Secondary | ICD-10-CM

## 2017-12-12 DIAGNOSIS — J9621 Acute and chronic respiratory failure with hypoxia: Secondary | ICD-10-CM | POA: Diagnosis not present

## 2017-12-12 DIAGNOSIS — D510 Vitamin B12 deficiency anemia due to intrinsic factor deficiency: Secondary | ICD-10-CM

## 2017-12-12 DIAGNOSIS — M4722 Other spondylosis with radiculopathy, cervical region: Secondary | ICD-10-CM | POA: Diagnosis not present

## 2017-12-12 DIAGNOSIS — E1159 Type 2 diabetes mellitus with other circulatory complications: Secondary | ICD-10-CM | POA: Diagnosis not present

## 2017-12-12 LAB — COMPREHENSIVE METABOLIC PANEL
ALBUMIN: 3.9 g/dL (ref 3.5–5.2)
ALT: 20 U/L (ref 0–53)
AST: 18 U/L (ref 0–37)
Alkaline Phosphatase: 64 U/L (ref 39–117)
BUN: 27 mg/dL — AB (ref 6–23)
CHLORIDE: 97 meq/L (ref 96–112)
CO2: 39 meq/L — AB (ref 19–32)
Calcium: 9.3 mg/dL (ref 8.4–10.5)
Creatinine, Ser: 1.24 mg/dL (ref 0.40–1.50)
GFR: 61.05 mL/min (ref >60.00)
Glucose, Bld: 97 mg/dL (ref 70–99)
POTASSIUM: 3.6 meq/L (ref 3.5–5.1)
SODIUM: 141 meq/L (ref 135–145)
Total Bilirubin: 0.4 mg/dL (ref 0.2–1.2)
Total Protein: 7.8 g/dL (ref 6.0–8.3)

## 2017-12-12 LAB — MICROALBUMIN / CREATININE URINE RATIO
CREATININE, U: 74.9 mg/dL
MICROALB/CREAT RATIO: 0.9 mg/g (ref 0.0–30.0)

## 2017-12-12 LAB — VITAMIN B12: VITAMIN B 12: 1104 pg/mL — AB (ref 211–911)

## 2017-12-12 LAB — HEMOGLOBIN A1C: HEMOGLOBIN A1C: 6.4 % (ref 4.6–6.5)

## 2017-12-12 MED ORDER — OXYCODONE HCL 15 MG PO TABS: 15.0000 mg | ORAL_TABLET | Freq: Three times a day (TID) | ORAL | 0 refills | Status: DC | PRN

## 2017-12-12 MED ORDER — PREDNISONE 10 MG PO TABS: ORAL_TABLET | ORAL | 0 refills | Status: DC

## 2017-12-12 NOTE — Progress Notes (Signed)
Subjective:  Patient ID: Victor Daniels, male    DOB: 13-Jan-1946  Age: 71 y.o. MRN: [id number]  CC: The primary encounter diagnosis was Type 2 diabetes mellitus with other circulatory complication, without long-term current use of insulin (HCC). Diagnoses of Pernicious anemia, Chronic left shoulder pain, Cervical radiculopathy due to degenerative joint disease of spine, and Acute on chronic respiratory failure with hypoxia and hypercapnia (HCC) were also pertinent to this visit.  HPI Victor Kempdward R Baham presents for 3 month follow up on diabetes,  COPD with chronic 02 dependent respiratory failure,,. And  chronic pain   Patient has no complaints today.  Patient is following a low glycemic index diet and taking all prescribed medications regularly without side effects.  Fasting sugars have been under less than 140 most of the time and post prandials have been under 160 except on rare occasions. Patient is not exercising due to medical comorbidities  But is  intentionally trying to lose weight . And is  Losing weight in spite of eating a lot of white castle sliders    Patient has had an eye exam in the last 12 months and checks feet regularly for signs of infection.  Patient does not walk barefoot outside,  And denies an numbness tingling or burning in feet. Patient is up to date on all recommended vaccinations  Chronic neck ,  Shoulder and back pain. He has Requested refill on oxycodone .  Takes 3 daily  On bad days, some days noe during the day, but always at night    Takes 2 at night to sleep because of joint pain, along with a muscle relaxer .    COPD  :  Seeing [facility].  Short of breath with exertion,   Severe Venous insufficiency:   Had his LLE venous ulcer treated by Wound Care several times in November . Wound now healed,  Recommended using 30-40 cmopression  Lab Results  Component Value Date   HGBA1C 6.4 12/12/2017   Lab Results  Component Value Date   MICROALBUR <0.7  12/12/2017     Outpatient Medications Prior to Visit  Medication Sig Dispense Refill  . albuterol (PROVENTIL) (2.5 MG/3ML) 0.083% nebulizer solution USE ONE VIAL IN NEBULIZER EVERY 6 HOURS AS NEEDED FOR WHEEZING OR SHORTNESS OF BREATH 1 vial 1  . amLODipine (NORVASC) 5 MG tablet TAKE 1 TABLET EVERY DAY 90 tablet 3  . arformoterol (BROVANA) 15 MCG/2ML NEBU Take 2 mLs (15 mcg total) by nebulization 2 (two) times daily. DX: COPD  DX Code: J44.9 120 mL 5  . aspirin 81 MG tablet Take 81 mg by mouth daily.    . budesonide (PULMICORT) 0.25 MG/2ML nebulizer solution Take 2 mLs (0.25 mg total) by nebulization 2 (two) times daily. DX: COPD DX Code: J44.9 120 mL 5  . carvedilol (COREG) 12.5 MG tablet TAKE 1 TABLET TWICE DAILY WITH A MEAL 180 tablet 3  . docusate sodium (COLACE) 100 MG capsule Take 2 capsules (200 mg total) by mouth daily. 60 capsule 2  . furosemide (LASIX) 20 MG tablet Take 1 tablet (20 mg total) by mouth daily as needed. 90 tablet 3  . ipratropium (ATROVENT) 0.03 % nasal spray PLACE 2 SPRAYS INTO BOTH NOSTRILS EVERY 12 (TWELVE) HOURS. 60 mL 12  . losartan-hydrochlorothiazide (HYZAAR) 100-25 MG tablet TAKE 1 TABLET EVERY DAY 90 tablet 3  . Multiple Vitamin (MULTIVITAMIN) capsule Take 1 capsule by mouth daily.    . nitroGLYCERIN (NITROSTAT) 0.4 MG SL tablet Place 1  tablet (0.4 mg total) under the tongue every 5 (five) minutes as needed for chest pain. Maximum dose 3 tablets 50 tablet 3  . omeprazole (PRILOSEC) 40 MG capsule Take 1 capsule (40 mg total) by mouth daily. 30 capsule 5  . simvastatin (ZOCOR) 20 MG tablet TAKE 1 TABLET AT BEDTIME 90 tablet 2  . SPIRIVA RESPIMAT 2.5 MCG/ACT AERS INHALE TWO SPRAY(S) BY MOUTH ONCE DAILY 4 g 3  . tiZANidine (ZANAFLEX) 4 MG tablet Take 1 tablet (4 mg total) by mouth every 6 (six) hours as needed for muscle spasms. 360 tablet 0  . VENTOLIN HFA 108 (90 Base) MCG/ACT inhaler INHALE 2 PUFFS INTO LUNGS EVERY 6 HOURS AS NEEDED FOR WHEEZING OR SHORTNESS OF  BREATH 18 each 5  . oxyCODONE (ROXICODONE) 15 MG immediate release tablet Take 1 tablet (15 mg total) by mouth every 8 (eight) hours as needed for pain. 90 tablet 0   No facility-administered medications prior to visit.     Review of Systems;  Patient denies headache, fevers, malaise, unintentional weight loss, skin rash, eye pain, sinus congestion and sinus pain, sore throat, dysphagia,  hemoptysis , cough, dyspnea, wheezing, chest pain, palpitations, orthopnea, edema, abdominal pain, nausea, melena, diarrhea, constipation, flank pain, dysuria, hematuria, urinary  Frequency, nocturia, numbness, tingling, seizures,  Focal weakness, Loss of consciousness,  Tremor, insomnia, depression, anxiety, and suicidal ideation.      Objective:  BP 132/76 (BP Location: Left Arm, Patient Position: Sitting, Cuff Size: Normal)   Pulse 75   Temp 98.3 F (36.8 C) (Oral)   Resp 17   Ht 6' (1.829 m)   Wt 292 lb 12.8 oz (132.8 kg)   SpO2 (!) 88% Comment: pt states he left his O2 in the car.  BMI 39.71 kg/m   BP Readings from Last 3 Encounters:  12/12/17 132/76  09/12/17 132/72  08/30/17 (!) 141/82    Wt Readings from Last 3 Encounters:  12/12/17 292 lb 12.8 oz (132.8 kg)  09/12/17 (!) 301 lb 12.8 oz (136.9 kg)  08/30/17 290 lb (131.5 kg)    General appearance: alert, cooperative and appears stated age Ears: normal TM's and external ear canals both ears Throat: lips, mucosa, and tongue normal; teeth and gums normal Neck: no adenopathy, no carotid bruit, supple, symmetrical, trachea midline and thyroid not enlarged, symmetric, no tenderness/mass/nodules Back: symmetric, no curvature. ROM normal. No CVA tenderness. Lungs: clear to auscultation bilaterally Heart: regular rate and rhythm, S1, S2 normal, no murmur, click, rub or gallop Abdomen: soft, non-tender; bowel sounds normal; no masses,  no organomegaly Pulses: 2+ and symmetric Skin: Skin color, texture, turgor normal. No rashes or  lesions Lymph nodes: Cervical, supraclavicular, and axillary nodes normal.  Lab Results  Component Value Date   HGBA1C 6.4 12/12/2017   HGBA1C 6.4 09/12/2017   HGBA1C 6.4 06/12/2017    Lab Results  Component Value Date   CREATININE 1.24 12/12/2017   CREATININE 1.06 09/12/2017   CREATININE 1.01 06/12/2017    Lab Results  Component Value Date   WBC 7.6 02/23/2016   HGB 12.6 (L) 02/23/2016   HCT 37.6 (L) 02/23/2016   PLT 182.0 02/23/2016   GLUCOSE 97 12/12/2017   CHOL 151 06/12/2017   TRIG 77.0 06/12/2017   HDL 53.30 06/12/2017   LDLDIRECT 72.0 12/01/2016   LDLCALC 82 06/12/2017   ALT 20 12/12/2017   AST 18 12/12/2017   NA 141 12/12/2017   K 3.6 12/12/2017   CL 97 12/12/2017   CREATININE 1.24  12/12/2017   BUN 27 (H) 12/12/2017   CO2 39 (H) 12/12/2017   TSH 0.62 06/12/2017   INR 0.9 04/02/2015   HGBA1C 6.4 12/12/2017   MICROALBUR <0.7 12/12/2017    Dg Foot Complete Left  Result Date: 10/17/2017 CLINICAL DATA:  Nonhealing wound EXAM: LEFT FOOT - COMPLETE 3+ VIEW COMPARISON:  None. FINDINGS: No fracture or malalignment. Mild degenerative changes at the first MTP joint. No periostitis or bone destruction. Linear opacities along the plantar surface of the first digit at the level of PIP joint, may reflect soft tissue wound an artifact. Large calcaneal spur. IMPRESSION: 1. No definite acute osseous abnormality. 2. Suspected ulcer or soft tissue wound adjacent to the first PIP joint. 3. Large plantar calcaneal spur Electronically Signed   By: Jasmine Pang M.D.   On: 10/17/2017 15:09    Assessment & Plan:   Problem List Items Addressed This Visit    Pernicious anemia   Relevant Orders   Vitamin B12 (Completed)   Cervical radiculopathy due to degenerative joint disease of spine    Now with intermittent pain and  numbness occurring in the right hand. Unclear whether his current pain is radiculopathy from progressive spinal stenosis or due to  Unresolved MSK issues for  right shoulder rotator cuff surgery or plain OA.   Refill history confirmed via [place] Controlled Substance database, accessed by me today, and 3 months of oxycodone 15 mg  Tid  refills given. #90/month .      Diabetes mellitus with circulatory complication (HCC) - Primary     Remains  well-controlled on diet alone . Patient is up-to-date on eye exams and foot exam is normal today. Patient has no microalbuminuria. Patient is tolerating statin therapy for CAD risk reduction and on ACE/ARB for renal protection and hypertension      Continue asa and statin.  He has no proteinuria and has annual eye exams. He has deferred the final Pneumovax vaccine.   Lab Results  Component Value Date   HGBA1C 6.4 12/12/2017   Lab Results  Component Value Date   MICROALBUR <0.7 12/12/2017         Relevant Orders   Microalbumin / creatinine urine ratio (Completed)   Hemoglobin A1c (Completed)   Comprehensive metabolic panel (Completed)   Respiratory failure, acute-on-chronic (HCC)    Secondary to COPD .  He has documented desaturations with exertion meeting criteria for supplemental oxygen,  Which was ordered in October 2017.  Continue supplemental oxygen . He has mild sheezing on exam today.  Prednisone taper prescribed.  Continue  LABA and inhaled corticosteroids      Shoulder pain, left    He has deferred surgery due to lack of family support.  Refills on his narcotics were given.  Now with intermittent pain and  numbness occurring in the right hand. Refill history confirmed via [place] Controlled Substance database, accessed by me today, and 3 months of oxycodone 15 mg  Tid  refills given. #90/month .         I am having Tawni Millers. Rondon start on predniSONE. I am also having him maintain his aspirin, SPIRIVA RESPIMAT, nitroGLYCERIN, multivitamin, omeprazole, budesonide, arformoterol, ipratropium, tiZANidine, losartan-hydrochlorothiazide, albuterol, simvastatin, amLODipine, furosemide, docusate sodium,  VENTOLIN HFA, carvedilol, oxyCODONE, oxyCODONE, oxyCODONE, and oxyCODONE.  Meds ordered this encounter  Medications  . oxyCODONE (ROXICODONE) 15 MG immediate release tablet    Sig: Take 1 tablet (15 mg total) by mouth every 8 (eight) hours as needed for pain.  Dispense:  90 tablet    Refill:  0    For refill on or after December 19 2017  . oxyCODONE (ROXICODONE) 15 MG immediate release tablet    Sig: Take 1 tablet (15 mg total) by mouth every 8 (eight) hours as needed for pain.    Dispense:  90 tablet    Refill:  0    May refill on or after February 12 2018  . oxyCODONE (ROXICODONE) 15 MG immediate release tablet    Sig: Take 1 tablet (15 mg total) by mouth every 8 (eight) hours as needed for pain.    Dispense:  90 tablet    Refill:  0    May refill on or after March 12 2018  . oxyCODONE (ROXICODONE) 15 MG immediate release tablet    Sig: Take 1 tablet (15 mg total) by mouth every 8 (eight) hours as needed for pain.    Dispense:  90 tablet    Refill:  0    May refill on or after January 12 2018  . predniSONE (DELTASONE) 10 MG tablet    Sig: 6 tablets on Day 1 , then reduce by 1 tablet daily until gone    Dispense:  21 tablet    Refill:  0    Medications Discontinued During This Encounter  Medication Reason  . oxyCODONE (ROXICODONE) 15 MG immediate release tablet     Follow-up: No Follow-up on file.   Sherlene Shamseresa L Maveryk Renstrom, MD

## 2017-12-12 NOTE — Telephone Encounter (Signed)
R/s 01/14/18

## 2017-12-12 NOTE — Patient Instructions (Addendum)
I have refilled your oxycodone for 3 more months,  You can use one in the morning and two at night  Watch your total salt intakes  Less than 4  Daily Is your goal    Prednisone taper for your wheezing and joint pain ,    You can Try adding turmeric in capsule form for joint pain    return in early April    try the Healthy Choice Beef Merlot dinner

## 2017-12-15 NOTE — Assessment & Plan Note (Signed)
Remains  well-controlled on diet alone . Patient is up-to-date on eye exams and foot exam is normal today. Patient has no microalbuminuria. Patient is tolerating statin therapy for CAD risk reduction and on ACE/ARB for renal protection and hypertension      Continue asa and statin.  He has no proteinuria and has annual eye exams. He has deferred the final Pneumovax vaccine.   Lab Results  Component Value Date   HGBA1C 6.4 12/12/2017   Lab Results  Component Value Date   MICROALBUR <0.7 12/12/2017

## 2017-12-15 NOTE — Assessment & Plan Note (Addendum)
Secondary to COPD .  He has documented desaturations with exertion meeting criteria for supplemental oxygen,  Which was ordered in October 2017.  Continue supplemental oxygen . He has mild sheezing on exam today.  Prednisone taper prescribed.  Continue  LABA and inhaled corticosteroids

## 2017-12-15 NOTE — Assessment & Plan Note (Addendum)
He has deferred surgery due to lack of family support.  Refills on his narcotics were given.  Now with intermittent pain and  numbness occurring in the right hand. Refill history confirmed via [place] Controlled Substance database, accessed by me today, and 3 months of oxycodone 15 mg  Tid  refills given. #90/month .

## 2017-12-15 NOTE — Assessment & Plan Note (Signed)
Now with intermittent pain and  numbness occurring in the right hand. Unclear whether his current pain is radiculopathy from progressive spinal stenosis or due to  Unresolved MSK issues for right shoulder rotator cuff surgery or plain OA.   Refill history confirmed via [place] Controlled Substance database, accessed by me today, and 3 months of oxycodone 15 mg  Tid  refills given. #90/month .

## 2018-01-03 ENCOUNTER — Telehealth: Payer: Self-pay | Admitting: Internal Medicine

## 2018-01-03 NOTE — Telephone Encounter (Signed)
Copied from CRM [phone]. Topic: General - Other >> Jan 03, 2018 10:16 AM Cecelia ByarsGreen, Temeka L, RMA wrote: Reason for CRM: Medication refill request for Methocarbamol 500 mg [facility]

## 2018-01-03 NOTE — Telephone Encounter (Signed)
Controlled substance 

## 2018-01-03 NOTE — Telephone Encounter (Signed)
Please advise 

## 2018-01-04 MED ORDER — METHOCARBAMOL 500 MG PO TABS: 500.0000 mg | ORAL_TABLET | Freq: Three times a day (TID) | ORAL | 1 refills | Status: DC | PRN

## 2018-01-04 NOTE — Addendum Note (Signed)
Addended by: Sandy SalaamWOODWARD, Meryle Pugmire on: 01/04/2018 05:20 PM   Modules accepted: Orders

## 2018-01-06 DIAGNOSIS — J449 Chronic obstructive pulmonary disease, unspecified: Secondary | ICD-10-CM | POA: Diagnosis not present

## 2018-01-14 ENCOUNTER — Ambulatory Visit: Payer: Medicare HMO | Admitting: Pulmonary Disease

## 2018-01-14 ENCOUNTER — Telehealth: Payer: Self-pay | Admitting: Internal Medicine

## 2018-01-14 ENCOUNTER — Encounter: Payer: Self-pay | Admitting: Pulmonary Disease

## 2018-01-14 VITALS — BP 160/84 | HR 83 | Ht 72.0 in | Wt 296.0 lb

## 2018-01-14 DIAGNOSIS — E668 Other obesity: Secondary | ICD-10-CM | POA: Diagnosis not present

## 2018-01-14 DIAGNOSIS — G4733 Obstructive sleep apnea (adult) (pediatric): Secondary | ICD-10-CM

## 2018-01-14 DIAGNOSIS — R0609 Other forms of dyspnea: Secondary | ICD-10-CM

## 2018-01-14 DIAGNOSIS — J8410 Pulmonary fibrosis, unspecified: Secondary | ICD-10-CM

## 2018-01-14 DIAGNOSIS — J449 Chronic obstructive pulmonary disease, unspecified: Secondary | ICD-10-CM | POA: Diagnosis not present

## 2018-01-14 MED ORDER — PREDNISONE 10 MG PO TABS
ORAL_TABLET | ORAL | 0 refills | Status: DC
Start: 2018-01-14 — End: 2018-02-26

## 2018-01-14 MED ORDER — AZITHROMYCIN 500 MG PO TABS: 500.0000 mg | ORAL_TABLET | Freq: Every day | ORAL | 0 refills | Status: DC

## 2018-01-14 NOTE — Progress Notes (Signed)
PULMONARY OFFICE FOLLOW UP NOTE  Requesting MD/Service: Victor Daniels Date of initial consultation: 03/20/16 Reason for consultation: COPD, Dyspnea  PT PROFILE: 72 y.o. M former smoker, previously followed by [name] (last seen in 2014) with obesity, COPD (FEV1 40% predicted in 2011) and probable OSA re-evaluated 03/20/16 for progressive DOE. Also with prior hx of cardiomyopathy (LVEF 26% in 2010 but normal 03/02/16)  DATA: 05/17/16 PFTs: severe airflow limitation due to mixed obstruction and restriction. Lung volumes moderately reduced 05/19/16 CXR: Chronic changes in R base 01/02/17 PSG: severe OSA. AHI 65/hr 02/13/17 CPAP titration: recommended CPAP 13 cm H2O  INTERVAL: Last seen 09/2016.  No major pulmonary events since that time.  SUBJ: This is a routine reevaluation.  Since last seen, he is undergone sleep study and CPAP titration study as reported above.  This was under the direction of Dr. [name].  However, he has refused CPAP therapy.  He remains on budesonide and arformoterol nebulized twice a day.  He has an albuterol rescue inhaler which he uses several times per week.   He remains on oxygen therapy which he uses with sleep and as needed with exertion. He continues to have class III/IV dyspnea which is unchanged from previously. Denies CP, fever, purulent sputum, hemoptysis, LE edema and calf tenderness.   OBJ:  Vitals:   01/14/18 1145 01/14/18 1147  BP:  (!) 160/84  Pulse:  83  SpO2:  91%  Weight: 134.3 kg (296 lb)   Height: 6' (1.829 m)   2 LPM   EXAM:  Gen: Obese, NAD HEENT: NCAT Neck: No JVD Lungs: breath sounds moderately diminished, No wheezes Cardiovascular: Reg, no M Abdomen: obese, soft, nontender, normal BS Ext: Chronic stasis changes, no clubbing or cyanosis, 2-3+ symmetric edema Neuro: grossly intact  DATA:   BMP Latest Ref Rng & Units 12/12/2017 09/12/2017 06/12/2017  Glucose 70 - 99 mg/dL 97 161(W117(H) 960(A117(H)  BUN 6 - 23 mg/dL 54(U27(H) 20 21  Creatinine  0.40 - 1.50 mg/dL 9.811.24 1.911.06 4.781.01  Sodium 135 - 145 mEq/L 141 141 142  Potassium 3.5 - 5.1 mEq/L 3.6 3.6 3.7  Chloride 96 - 112 mEq/L 97 96 99  CO2 19 - 32 mEq/L 39(H) 39(H) 36(H)  Calcium 8.4 - 10.5 mg/dL 9.3 9.5 9.8    CBC Latest Ref Rng & Units 02/23/2016 06/25/2015 04/04/2015  WBC 4.0 - 10.5 K/uL 7.6 7.0 11.0(H)  Hemoglobin 13.0 - 17.0 g/dL 12.6(L) 13.6 11.0(L)  Hematocrit 39.0 - 52.0 % 37.6(L) 41.2 35.1(L)  Platelets 150.0 - 400.0 K/uL 182.0 181.0 150    CXR: No recent film  IMPRESSION:   1) Moderate COPD 2) chronic RLL scarring 3) class II/IV dyspnea 4) obstructive sleep apnea  5) chronic hypoxemic respiratory failure   PLAN:  1) continue current regimen of Pulmicort, Brovana and PRN albuterol. We discussed trial of addition of LAMA such as Spiriva but he refuses due to concern re: cost 2) Cont O2 at 2 LPM with exertion and sleep.  3) I encouraged that he reconsider trial of CPAP but he again refuses  4) recommended repeat CXR which he says he will get @ [name]'s office 4) follow up in 6 months or sooner as needed   Victor Fischeravid Palmina Clodfelter, MD PCCM service Mobile [phone] Pager [phone] 01/14/2018 12:19 PM

## 2018-01-14 NOTE — Telephone Encounter (Signed)
Pt states that he needs a prednisone taper. He is starting to cough up phlegm. Pt didn't want to be triaged

## 2018-01-14 NOTE — Patient Instructions (Signed)
Continue nebulized Pulmicort, Brovana Continue albuterol as needed Continue oxygen therapy with sleep and exertion I have recommended a repeat chest x-ray.  This is to be obtained at Dr. Melina Schoolsullo's office I have recommended CPAP for obstructive sleep apnea.  We can help arrange this if you wish to undertake this therapy  Follow-up in 6 months or sooner as needed

## 2018-01-14 NOTE — Telephone Encounter (Signed)
Spoke with pt and he stated that last Thursday he started coughing up phlegm and now it feels like it is moving down in his chest and his chest is feeling "heavy". No fever, no body aches, no chills, no sore throat. Pt is wanting to know if you would be willing to just send him in a prednisone taper.

## 2018-01-14 NOTE — Telephone Encounter (Signed)
Patient is aware. He also stated that he has not had a good bowel movement in 2 days tomorrow will make 3 so was wondering if we could send in lactulose if needed.

## 2018-01-14 NOTE — Telephone Encounter (Signed)
Yes but he needs to be taking an antibiotic as well because he has COPD ,  I will send both to pharmacy.  .please remind him ot take a probiotic for 3 weeks

## 2018-01-15 MED ORDER — LACTULOSE 20 GM/30ML PO SOLN: ORAL | 3 refills | Status: DC

## 2018-01-15 NOTE — Telephone Encounter (Signed)
lACTULOSE SENT

## 2018-02-06 ENCOUNTER — Other Ambulatory Visit: Payer: Self-pay | Admitting: Internal Medicine

## 2018-02-06 DIAGNOSIS — J449 Chronic obstructive pulmonary disease, unspecified: Secondary | ICD-10-CM | POA: Diagnosis not present

## 2018-02-14 ENCOUNTER — Ambulatory Visit: Payer: Self-pay

## 2018-02-14 NOTE — Telephone Encounter (Signed)
   Answer Assessment - Initial Assessment Questions 1. REASON FOR CALL or QUESTION: "What is your reason for calling today?" or "How can I best help you?" or "What question do you have that I can help answer?"     [facility] called and they are requesting what dx is for pt's who are getting pain medicine (oxicodone). Pt is getting drug for left shoulder pain  Protocols used: INFORMATION ONLY CALL-A-AH

## 2018-02-15 ENCOUNTER — Telehealth: Payer: Self-pay | Admitting: Internal Medicine

## 2018-02-15 NOTE — Telephone Encounter (Signed)
Please advise 

## 2018-02-15 NOTE — Telephone Encounter (Signed)
Copied from CRM [phone]. Topic: Quick Communication - See Telephone Encounter >> Feb 15, 2018 10:32 AM Rudi CocoLathan, Rayna Brenner M, NT wrote: CRM for notification. See Telephone encounter for:   02/15/18. Pt. Calling to state that his insurance will no longer cover Ventolin HFA and seeing if Dr. [name] can prescribe another inhaler.

## 2018-02-18 NOTE — Telephone Encounter (Signed)
Pt called in to check the status of his request. Advised that request has been sent over to provider for assistance.

## 2018-02-19 NOTE — Telephone Encounter (Signed)
Olegario MessierKathy please call pharmacy and see if his pharmacy will cover the ventolin  as Pro air.    iF THEY will,  Then you have my permission to  Send in [employer] with the directions : 2 puffs every 6 hours as needed for dyspnea.

## 2018-02-19 NOTE — Telephone Encounter (Signed)
Pt's insurance will no longer cover the pt's ventolin inhaler and he is wondering if something else can be sent in that might be less expensive.

## 2018-02-20 ENCOUNTER — Ambulatory Visit: Payer: Self-pay | Admitting: *Deleted

## 2018-02-20 MED ORDER — ALBUTEROL SULFATE HFA 108 (90 BASE) MCG/ACT IN AERS: 2.0000 | INHALATION_SPRAY | Freq: Four times a day (QID) | RESPIRATORY_TRACT | 5 refills | Status: DC | PRN

## 2018-02-20 NOTE — Telephone Encounter (Signed)
Pt reports productive cough x 2 days; thick white mucous. States cough "pretty much constant, every 5 minutes or so." Reports SOB, mild at rest, worsens with coughing episode, exertion. Pt reports wheezing with exertion.  States wears O2 at HS but has been wearing during last 2 days; 2 L/[place]. Denies any fever, CP. H/O COPD,CHF. Declines UC/ED. States Dr. [name] has called in RX for prednisone in the past and "that's all I need. She knows that will take care of this."  Reiterated need to be seen, continues to decline UC/ED/OV.  Care advise given, pt instructed to call back if symptoms worsen.  Note routed to practice re: pt. request. Reason for Disposition . Wheezing is present  Answer Assessment - Initial Assessment Questions 1. ONSET: "When did the cough begin?"      2 days ago 2. SEVERITY: "How bad is the cough today?"      Constant, every 5 minutes 3. RESPIRATORY DISTRESS: "Describe your breathing."      Wheezing with exertion 4. FEVER: "Do you have a fever?" If so, ask: "What is your temperature, how was it measured, and when did it start?"     No 5. SPUTUM: "Describe the color of your sputum" (clear, white, yellow, green)     Thick whitish 6. HEMOPTYSIS: "Are you coughing up any blood?" If so ask: "How much?" (flecks, streaks, tablespoons, etc.)     no 7. CARDIAC HISTORY: "Do you have any history of heart disease?" (e.g., heart attack, congestive heart failure)      MI,CHF, DVT 8. LUNG HISTORY: "Do you have any history of lung disease?"  (e.g., pulmonary embolus, asthma, emphysema)     COPD 9. 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 OTHER SYMPTOMS: "Do you have any other symptoms?" (e.g., runny nose, wheezing, chest pain)       Stuffy nose then runny.   12. TRAVEL: "Have you traveled out of the country in the last month?" (e.g., travel history, exposures)        No  Protocols used: COUGH - ACUTE PRODUCTIVE-A-AH

## 2018-02-20 NOTE — Telephone Encounter (Signed)
fyi

## 2018-02-20 NOTE — Telephone Encounter (Signed)
Pro-air was the covered medication

## 2018-02-26 ENCOUNTER — Ambulatory Visit (INDEPENDENT_AMBULATORY_CARE_PROVIDER_SITE_OTHER): Payer: Medicare HMO

## 2018-02-26 ENCOUNTER — Telehealth: Payer: Self-pay | Admitting: Internal Medicine

## 2018-02-26 VITALS — BP 122/80 | HR 70 | Temp 98.1°F | Resp 16 | Ht 72.0 in | Wt 291.0 lb

## 2018-02-26 DIAGNOSIS — Z0000 Encounter for general adult medical examination without abnormal findings: Secondary | ICD-10-CM | POA: Diagnosis not present

## 2018-02-26 MED ORDER — DOXYCYCLINE HYCLATE 100 MG PO TABS: 100.0000 mg | ORAL_TABLET | Freq: Two times a day (BID) | ORAL | 0 refills | Status: DC

## 2018-02-26 MED ORDER — PREDNISONE 10 MG PO TABS: ORAL_TABLET | ORAL | 0 refills | Status: DC

## 2018-02-26 NOTE — Patient Instructions (Addendum)
  Mr. Victor Daniels , Thank you for taking time to come for your Medicare Wellness Visit. I appreciate your ongoing commitment to your health goals. Please review the following plan we discussed and let me know if I can assist you in the future.   Follow up with Dr. [name] as needed.    Bring a copy of your Health Care Power of Attorney and/or Living Will to be scanned into chart once completed.  Have a great day!  These are the goals we discussed: Goals    . Low carb diet     Reduce sugar intake       This is a list of the screening recommended for you and due dates:  Health Maintenance  Topic Date Due  . Colon Cancer Screening  06/12/2018*  . Pneumonia vaccines (2 of 2 - PPSV23) 08/22/2018*  . Complete foot exam   03/07/2018  . Hemoglobin A1C  06/12/2018  . Eye exam for diabetics  08/14/2018  . Tetanus Vaccine  05/25/2025  . Flu Shot  Completed  .  Hepatitis C: One time screening is recommended by Center for Disease Control  (CDC) for  adults born from [birth year] through 1965.   Completed  *Topic was postponed. The date shown is not the original due date.
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
## 2018-02-26 NOTE — Telephone Encounter (Signed)
Spoke with pt and informed him of the medication that Dr. Tullo has sent in for him. Pt gave a verbal understanding.  

## 2018-02-26 NOTE — Telephone Encounter (Signed)
Prednisone and doxycycyline sent,  See prior response for instructions on dosing

## 2018-02-26 NOTE — Telephone Encounter (Signed)
Calling in prednisone 60 mg daily x 3 days, followed by a 5 day taper,  and doxycycline (ANTIBIOTIC) .   Please take a probiotic ( Align, Floraque or Culturelle) of the generic version of one of these  For a minimum of 3 weeks to prevent a serious antibiotic associated diarrhea  Called clostridium dificile colitis

## 2018-02-26 NOTE — Telephone Encounter (Signed)
Patient was in the office with Denisa today and wanted to know if you would be willing to send him in a prednisone taper for his COPD exacerbation. He called last week but Olegario Messierkathy had spoken with him.

## 2018-02-28 IMAGING — CR DG FOOT COMPLETE 3+V*L*
1 series · 3 of 3 positions shown · non-contrast
Comparison: None.

CLINICAL DATA: Nonhealing wound

EXAM:
LEFT FOOT - COMPLETE 3+ VIEW

[Series 1: dg foot complete left · 0.14mm/px · 3 of 3 slices shown]
[im 1/3]
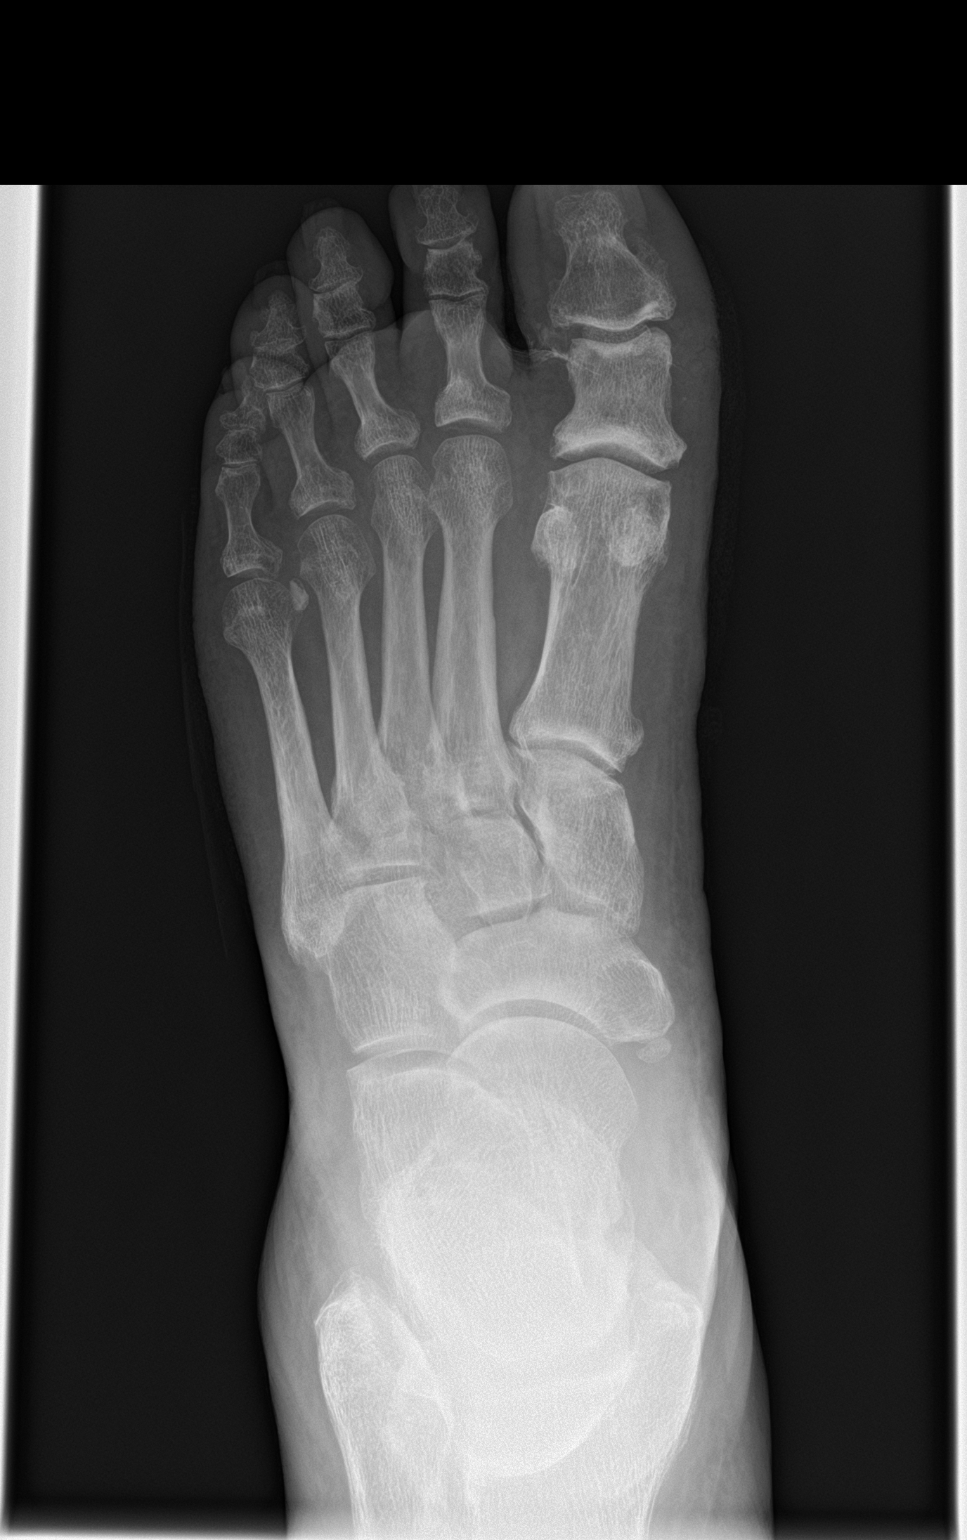
[im 2/3]
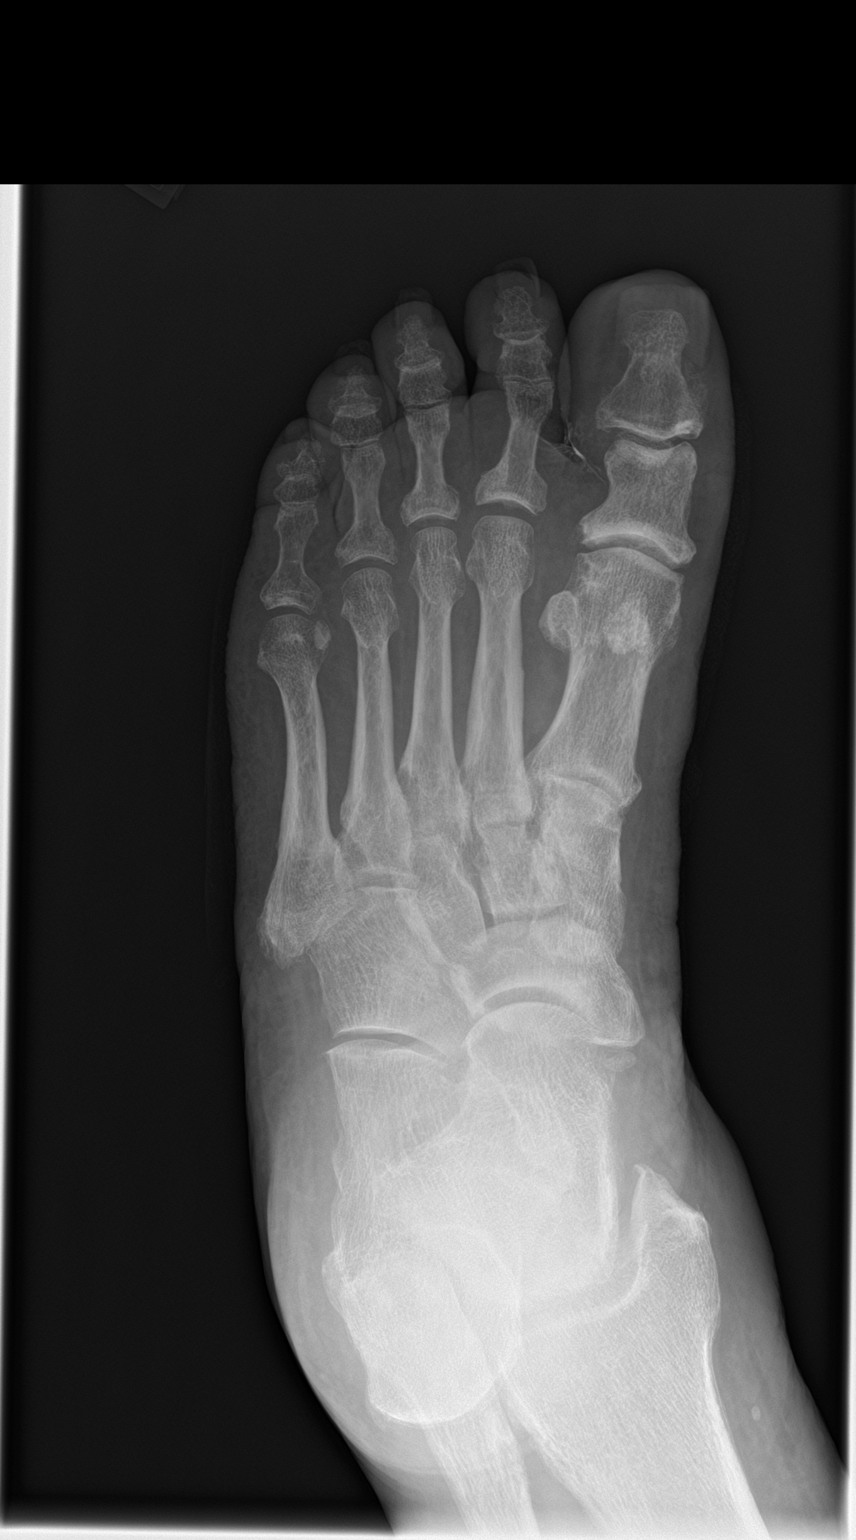
[im 3/3]
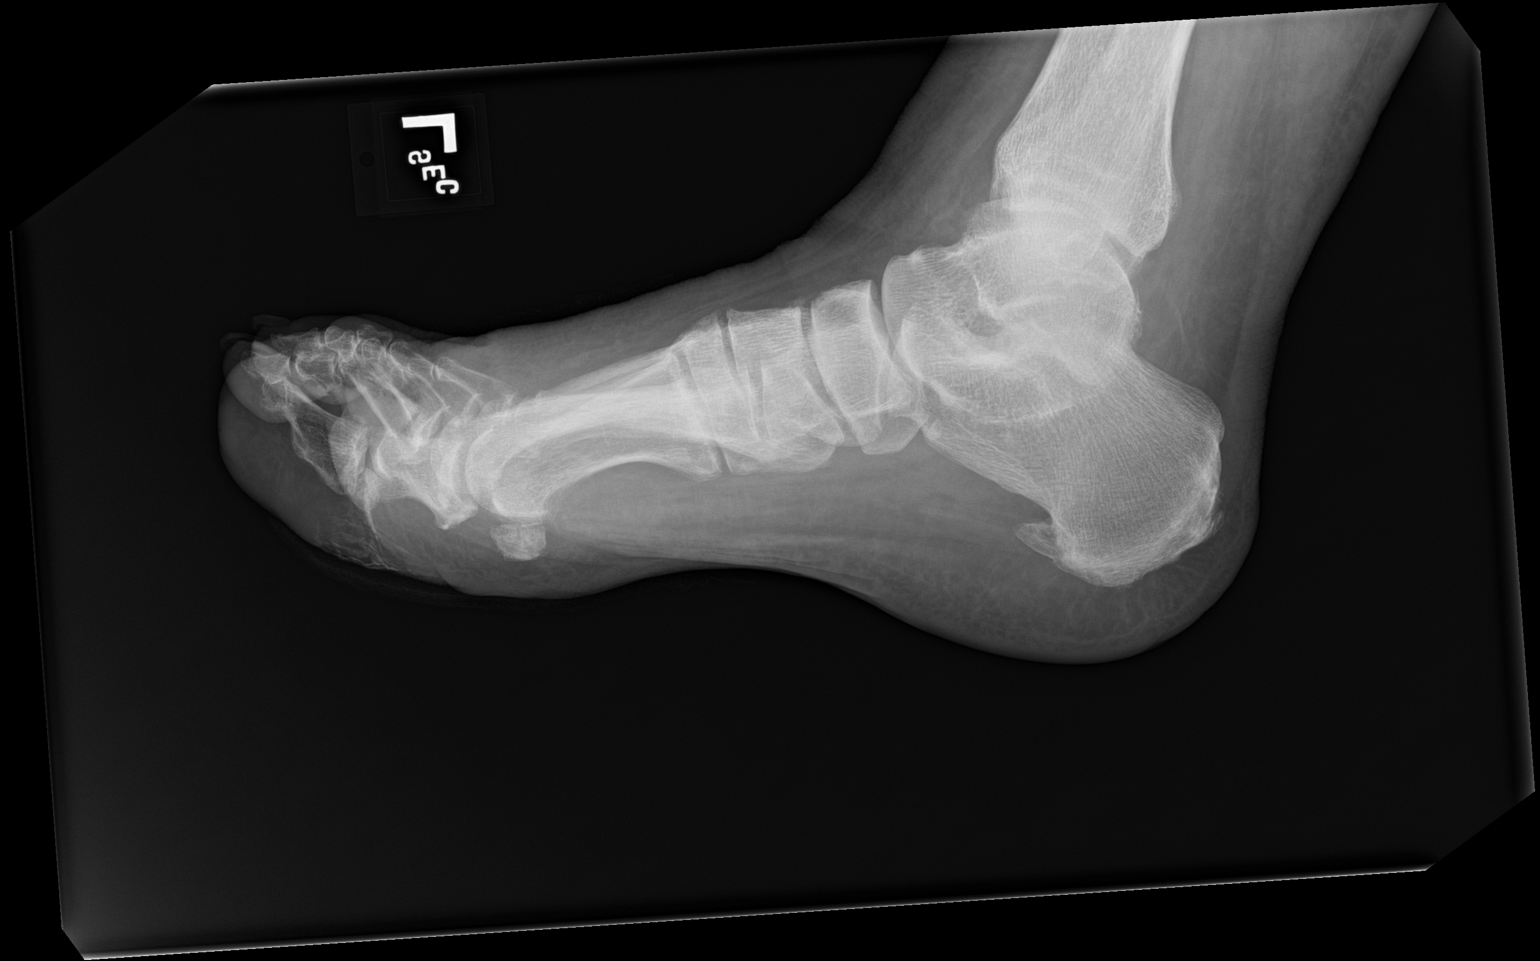

[3 of 3 positions shown; findings below may reference images not displayed]

FINDINGS: No fracture or malalignment. Mild degenerative changes at the first
MTP joint. No periostitis or bone destruction. Linear opacities
along the plantar surface of the first digit at the level of PIP
joint, may reflect soft tissue wound an artifact. Large calcaneal
spur.
IMPRESSION: 1. No definite acute osseous abnormality.
2. Suspected ulcer or soft tissue wound adjacent to the first PIP
joint.
3. Large plantar calcaneal spur

## 2018-03-06 DIAGNOSIS — J449 Chronic obstructive pulmonary disease, unspecified: Secondary | ICD-10-CM | POA: Diagnosis not present

## 2018-03-13 ENCOUNTER — Ambulatory Visit (INDEPENDENT_AMBULATORY_CARE_PROVIDER_SITE_OTHER): Payer: Medicare HMO

## 2018-03-13 ENCOUNTER — Encounter: Payer: Self-pay | Admitting: Internal Medicine

## 2018-03-13 ENCOUNTER — Ambulatory Visit (INDEPENDENT_AMBULATORY_CARE_PROVIDER_SITE_OTHER): Payer: Medicare HMO | Admitting: Internal Medicine

## 2018-03-13 VITALS — BP 112/68 | HR 76 | Temp 98.1°F | Resp 15 | Ht 72.0 in | Wt 291.4 lb

## 2018-03-13 DIAGNOSIS — J984 Other disorders of lung: Secondary | ICD-10-CM | POA: Diagnosis not present

## 2018-03-13 DIAGNOSIS — R059 Cough, unspecified: Secondary | ICD-10-CM

## 2018-03-13 DIAGNOSIS — R05 Cough: Secondary | ICD-10-CM

## 2018-03-13 DIAGNOSIS — E1159 Type 2 diabetes mellitus with other circulatory complications: Secondary | ICD-10-CM | POA: Diagnosis not present

## 2018-03-13 DIAGNOSIS — M4722 Other spondylosis with radiculopathy, cervical region: Secondary | ICD-10-CM

## 2018-03-13 DIAGNOSIS — J441 Chronic obstructive pulmonary disease with (acute) exacerbation: Secondary | ICD-10-CM | POA: Diagnosis not present

## 2018-03-13 MED ORDER — PREDNISONE 10 MG PO TABS: ORAL_TABLET | ORAL | 0 refills | Status: DC

## 2018-03-13 MED ORDER — OXYCODONE HCL 15 MG PO TABS: 15.0000 mg | ORAL_TABLET | Freq: Three times a day (TID) | ORAL | 0 refills | Status: DC | PRN

## 2018-03-13 MED ORDER — ALBUTEROL SULFATE (2.5 MG/3ML) 0.083% IN NEBU: 2.5000 mg | INHALATION_SOLUTION | Freq: Four times a day (QID) | RESPIRATORY_TRACT | 1 refills | Status: DC | PRN

## 2018-03-13 MED ORDER — ALBUTEROL SULFATE HFA 108 (90 BASE) MCG/ACT IN AERS: 2.0000 | INHALATION_SPRAY | Freq: Four times a day (QID) | RESPIRATORY_TRACT | 5 refills | Status: DC | PRN

## 2018-03-13 MED ORDER — AZITHROMYCIN 500 MG PO TABS: 500.0000 mg | ORAL_TABLET | Freq: Every day | ORAL | 0 refills | Status: DC

## 2018-03-13 NOTE — Progress Notes (Signed)
Subjective:  Patient ID: Victor Daniels, male    DOB: Feb 19, 1946  Age: 72 y.o. MRN: [id number]  CC: The primary encounter diagnosis was Cough. Diagnoses of COPD exacerbation (HCC), Type 2 diabetes mellitus with other circulatory complication, without long-term current use of insulin (HCC), and Cervical radiculopathy due to degenerative joint disease of spine were also pertinent to this visit.  HPI Victor Kempdward R Holmes presents for 3 month follow up on diabetes and medication refill on oxycodone .  Patient does not feel today .    Patient is following a low glycemic index diet and taking all prescribed medications regularly without side effects.  Fasting sugars have been under less than 140 most of the time and post prandials have been under 160 except on rare occasions. Patient is exercising about 3 times per week and intentionally trying to lose weight .  Patient has had an eye exam in the last 12 months and checks feet regularly for signs of infection.  Patient does not walk barefoot outside,  And denies an numbness tingling or burning in feet. Patient is up to date on all recommended vaccinations   Has been wheezing for a couple of weeks.   started prednisone on march 5 along with doxycycline and notes a  50% improvement, but  still short of breath   . Having pleurisy on the left side.  Thinks the 02 tank is aggravating his breathing issues so  he is using the portable tank. Sputum is yellow /green .  No  fevers..  . No sinus pain .  No diarrhea.    Using Brovana and budesonide in nebulizer but needs  Refill on albuteorl nebs.    Having joint pain involving both hands and arms ,  Radiates up to shoulders. History of prior trauma.  Chronic back and neck pain.  Managed with  Oxycodone 3 times daily. Refill history confirmed via [place] Controlled Substance databas, accessed by me today.Marland Kitchen.  Refills were given for  march 21 April and May   Outpatient Medications Prior to Visit  Medication Sig  Dispense Refill  . albuterol (PROVENTIL) (2.5 MG/3ML) 0.083% nebulizer solution USE ONE VIAL IN NEBULIZER EVERY 6 HOURS AS NEEDED FOR WHEEZING OR SHORTNESS OF BREATH 1 vial 1  . amLODipine (NORVASC) 5 MG tablet TAKE 1 TABLET EVERY DAY 90 tablet 3  . arformoterol (BROVANA) 15 MCG/2ML NEBU Take 2 mLs (15 mcg total) by nebulization 2 (two) times daily. DX: COPD  DX Code: J44.9 120 mL 5  . aspirin 81 MG tablet Take 81 mg by mouth daily.    . budesonide (PULMICORT) 0.25 MG/2ML nebulizer solution Take 2 mLs (0.25 mg total) by nebulization 2 (two) times daily. DX: COPD DX Code: J44.9 120 mL 5  . carvedilol (COREG) 12.5 MG tablet TAKE 1 TABLET TWICE DAILY WITH MEALS 180 tablet 1  . docusate sodium (COLACE) 100 MG capsule Take 2 capsules (200 mg total) by mouth daily. 60 capsule 2  . furosemide (LASIX) 20 MG tablet Take 1 tablet (20 mg total) by mouth daily as needed. (Patient taking differently: Take 20 mg by mouth every other day. ) 90 tablet 3  . ipratropium (ATROVENT) 0.03 % nasal spray PLACE 2 SPRAYS INTO BOTH NOSTRILS EVERY 12 (TWELVE) HOURS. 60 mL 12  . Lactulose 20 GM/30ML SOLN 30 ml every 4 hours until constipation is relieved 236 mL 3  . losartan-hydrochlorothiazide (HYZAAR) 100-25 MG tablet TAKE 1 TABLET EVERY DAY 90 tablet 3  . methocarbamol (ROBAXIN)  500 MG tablet Take 1 tablet (500 mg total) by mouth every 8 (eight) hours as needed for muscle spasms. 90 tablet 1  . Multiple Vitamin (MULTIVITAMIN) capsule Take 1 capsule by mouth daily.    . nitroGLYCERIN (NITROSTAT) 0.4 MG SL tablet Place 1 tablet (0.4 mg total) under the tongue every 5 (five) minutes as needed for chest pain. Maximum dose 3 tablets 50 tablet 3  . omeprazole (PRILOSEC) 40 MG capsule Take 1 capsule (40 mg total) by mouth daily. (Patient taking differently: Take 40 mg by mouth daily as needed. ) 30 capsule 5  . oxyCODONE (ROXICODONE) 15 MG immediate release tablet Take 1 tablet (15 mg total) by mouth every 8 (eight) hours as needed  for pain. 90 tablet 0  . simvastatin (ZOCOR) 20 MG tablet TAKE 1 TABLET AT BEDTIME 90 tablet 2  . tiZANidine (ZANAFLEX) 4 MG tablet Take 1 tablet (4 mg total) by mouth every 6 (six) hours as needed for muscle spasms. 360 tablet 0  . albuterol (PROAIR HFA) 108 (90 Base) MCG/ACT inhaler Inhale 2 puffs into the lungs every 6 (six) hours as needed for wheezing or shortness of breath. 18 g 5  . azithromycin (ZITHROMAX) 500 MG tablet Take 1 tablet (500 mg total) by mouth daily. 7 tablet 0  . doxycycline (VIBRA-TABS) 100 MG tablet Take 1 tablet (100 mg total) by mouth 2 (two) times daily. 20 tablet 0  . oxyCODONE (ROXICODONE) 15 MG immediate release tablet Take 1 tablet (15 mg total) by mouth every 8 (eight) hours as needed for pain. 90 tablet 0  . oxyCODONE (ROXICODONE) 15 MG immediate release tablet Take 1 tablet (15 mg total) by mouth every 8 (eight) hours as needed for pain. 90 tablet 0  . oxyCODONE (ROXICODONE) 15 MG immediate release tablet Take 1 tablet (15 mg total) by mouth every 8 (eight) hours as needed for pain. 90 tablet 0  . predniSONE (DELTASONE) 10 MG tablet 6 tablets daily for 3 days,  then reduce by 1 tablet daily until gone 33 tablet 0   No facility-administered medications prior to visit.     Review of Systems;  Patient denies headache, fevers, malaise, unintentional weight loss, skin rash, eye pain, sinus congestion and sinus pain, sore throat, dysphagia,  hemoptysis , cough, dyspnea, wheezing, chest pain, palpitations, orthopnea, edema, abdominal pain, nausea, melena, diarrhea, constipation, flank pain, dysuria, hematuria, urinary  Frequency, nocturia, numbness, tingling, seizures,  Focal weakness, Loss of consciousness,  Tremor, insomnia, depression, anxiety, and suicidal ideation.      Objective:  BP 112/68 (BP Location: Left Arm, Patient Position: Sitting, Cuff Size: Large)   Pulse 76   Temp 98.1 F (36.7 C) (Oral)   Resp 15   Ht 6' (1.829 m)   Wt 291 lb 6.4 oz (132.2 kg)    SpO2 90% Comment: 2L O2  BMI 39.52 kg/m   BP Readings from Last 3 Encounters:  03/13/18 112/68  02/26/18 122/80  01/14/18 (!) 160/84    Wt Readings from Last 3 Encounters:  03/13/18 291 lb 6.4 oz (132.2 kg)  02/26/18 291 lb (132 kg)  01/14/18 296 lb (134.3 kg)    General appearance: alert, cooperative and appears stated age Ears: normal TM's and external ear canals both ears Throat: lips, mucosa, and tongue normal; teeth and gums normal Neck: no adenopathy, no carotid bruit, supple, symmetrical, trachea midline and thyroid not enlarged, symmetric, no tenderness/mass/nodules Back: symmetric, no curvature. ROM normal. No CVA tenderness. Lungs: decreased iar movement  Bilaterally with intermittent end expiratory wheezing,  No rub or egophony Heart: regular rate and rhythm, S1, S2 normal, no murmur, click, rub or gallop Abdomen: soft, non-tender; bowel sounds normal; no masses,  no organomegaly Pulses: 2+ and symmetric Skin: Skin color, texture, turgor normal. No rashes or lesions Lymph nodes: Cervical, supraclavicular, and axillary nodes normal.  Lab Results  Component Value Date   HGBA1C 6.4 12/12/2017   HGBA1C 6.4 09/12/2017   HGBA1C 6.4 06/12/2017    Lab Results  Component Value Date   CREATININE 1.24 12/12/2017   CREATININE 1.06 09/12/2017   CREATININE 1.01 06/12/2017    Lab Results  Component Value Date   WBC 7.6 02/23/2016   HGB 12.6 (L) 02/23/2016   HCT 37.6 (L) 02/23/2016   PLT 182.0 02/23/2016   GLUCOSE 97 12/12/2017   CHOL 151 06/12/2017   TRIG 77.0 06/12/2017   HDL 53.30 06/12/2017   LDLDIRECT 72.0 12/01/2016   LDLCALC 82 06/12/2017   ALT 20 12/12/2017   AST 18 12/12/2017   NA 141 12/12/2017   K 3.6 12/12/2017   CL 97 12/12/2017   CREATININE 1.24 12/12/2017   BUN 27 (H) 12/12/2017   CO2 39 (H) 12/12/2017   TSH 0.62 06/12/2017   INR 0.9 04/02/2015   HGBA1C 6.4 12/12/2017   MICROALBUR <0.7 12/12/2017     Assessment & Plan:   Problem List  Items Addressed This Visit    Diabetes mellitus with circulatory complication (HCC)     Remains  well-controlled on diet alone . Patient is up-to-date on eye exams and foot exam is normal today. Patient has no microalbuminuria. Patient is tolerating statin therapy for CAD risk reduction and on ACE/ARB for renal protection and hypertension      Continue asa and statin.  He has no proteinuria and has annual eye exams. He has deferred the final Pneumovax vaccine.   Lab Results  Component Value Date   HGBA1C 6.4 12/12/2017   Lab Results  Component Value Date   MICROALBUR <0.7 12/12/2017         COPD exacerbation (HCC)    Chest x ray done due to failure of symptoms to resolve.  No infiltrates; will repeat prednisone taper and broaden antibiotic covrage to include atypicals with azithromycin       Relevant Medications   azithromycin (ZITHROMAX) 500 MG tablet   predniSONE (DELTASONE) 10 MG tablet   albuterol (PROVENTIL) (2.5 MG/3ML) 0.083% nebulizer solution   albuterol (PROAIR HFA) 108 (90 Base) MCG/ACT inhaler   Cervical radiculopathy due to degenerative joint disease of spine    Now with intermittent pain and  numbness occurring in the right hand. Unclear whether his current pain is radiculopathy from progressive spinal stenosis or due to  Unresolved MSK issues for right shoulder rotator cuff surgery or plain OA.   Refill history confirmed via [place] Controlled Substance database, accessed by me today, and 3 months of oxycodone 15 mg  Tid  refills given. #90/month .       Other Visit Diagnoses    Cough    -  Primary   Relevant Orders   DG Chest 2 View (Completed)      I have discontinued Tawni Millers. Wolin's predniSONE and doxycycline. I am also having him start on predniSONE and albuterol. Additionally, I am having him maintain his aspirin, nitroGLYCERIN, multivitamin, omeprazole, budesonide, arformoterol, ipratropium, tiZANidine, losartan-hydrochlorothiazide, albuterol, simvastatin,  amLODipine, furosemide, docusate sodium, oxyCODONE, methocarbamol, Lactulose, carvedilol, oxyCODONE, oxyCODONE, oxyCODONE, azithromycin, and albuterol.  Meds  ordered this encounter  Medications  . oxyCODONE (ROXICODONE) 15 MG immediate release tablet    Sig: Take 1 tablet (15 mg total) by mouth every 8 (eight) hours as needed for pain.    Dispense:  90 tablet    Refill:  0    May refill on or after March 14 2018  . oxyCODONE (ROXICODONE) 15 MG immediate release tablet    Sig: Take 1 tablet (15 mg total) by mouth every 8 (eight) hours as needed for pain.    Dispense:  90 tablet    Refill:  0    May refill on or after April 14 2018  . oxyCODONE (ROXICODONE) 15 MG immediate release tablet    Sig: Take 1 tablet (15 mg total) by mouth every 8 (eight) hours as needed for pain.    Dispense:  90 tablet    Refill:  0    May refill on or after May 14 2018  . azithromycin (ZITHROMAX) 500 MG tablet    Sig: Take 1 tablet (500 mg total) by mouth daily.    Dispense:  7 tablet    Refill:  0  . predniSONE (DELTASONE) 10 MG tablet    Sig: 6 tablets daily for 5 days, , then reduce by 1 tablet daily until gone    Dispense:  45 tablet    Refill:  0  . albuterol (PROVENTIL) (2.5 MG/3ML) 0.083% nebulizer solution    Sig: Take 3 mLs (2.5 mg total) by nebulization every 6 (six) hours as needed for wheezing or shortness of breath.    Dispense:  150 mL    Refill:  1  . albuterol (PROAIR HFA) 108 (90 Base) MCG/ACT inhaler    Sig: Inhale 2 puffs into the lungs every 6 (six) hours as needed for wheezing or shortness of breath.    Dispense:  18 g    Refill:  5   A total of 25 minutes of face to face time was spent with patient more than half of which was spent in counselling about the above mentioned conditions  and coordination of care   Medications Discontinued During This Encounter  Medication Reason  . azithromycin (ZITHROMAX) 500 MG tablet Completed Course  . doxycycline (VIBRA-TABS) 100 MG tablet  Completed Course  . predniSONE (DELTASONE) 10 MG tablet Completed Course  . oxyCODONE (ROXICODONE) 15 MG immediate release tablet Reorder  . oxyCODONE (ROXICODONE) 15 MG immediate release tablet Reorder  . oxyCODONE (ROXICODONE) 15 MG immediate release tablet Reorder  . albuterol (PROAIR HFA) 108 (90 Base) MCG/ACT inhaler Reorder    Follow-up: Return in about 3 months (around 06/13/2018) for follow up diabetes, med refil.   [name], MD

## 2018-03-13 NOTE — Patient Instructions (Signed)
You are having a COPD exacerbation   If you become more short of breath than you are now,  You need to go to the nearest ER immediately or call 911  I a treating you  with the following:  Prednisone 60 mg daily for 5 days , then begin the tapering dose for the next 5 days   Azithromycin once daily with food  7 days (antibiotic) Continue your  Nebulized medications  Take Delsym for cough    Please take a probiotic ( Align, Floraque or Culturelle), or  the generic version of one of these  For a minimum of 3 weeks to prevent a serious antibiotic associated diarrhea  Called clostridium dificile colitis

## 2018-03-16 NOTE — Assessment & Plan Note (Signed)
Remains  well-controlled on diet alone . Patient is up-to-date on eye exams and foot exam is normal today. Patient has no microalbuminuria. Patient is tolerating statin therapy for CAD risk reduction and on ACE/ARB for renal protection and hypertension      Continue asa and statin.  He has no proteinuria and has annual eye exams. He has deferred the final Pneumovax vaccine.   Lab Results  Component Value Date   HGBA1C 6.4 12/12/2017   Lab Results  Component Value Date   MICROALBUR <0.7 12/12/2017    

## 2018-03-16 NOTE — Assessment & Plan Note (Signed)
Now with intermittent pain and  numbness occurring in the right hand. Unclear whether his current pain is radiculopathy from progressive spinal stenosis or due to  Unresolved MSK issues for right shoulder rotator cuff surgery or plain OA.   Refill history confirmed via [place] Controlled Substance database, accessed by me today, and 3 months of oxycodone 15 mg  Tid  refills given. #90/month . 

## 2018-03-16 NOTE — Assessment & Plan Note (Addendum)
Chest x ray done due to failure of symptoms to resolve.  No infiltrates; will repeat prednisone taper and broaden antibiotic covrage to include atypicals with azithromycin

## 2018-03-18 DIAGNOSIS — J449 Chronic obstructive pulmonary disease, unspecified: Secondary | ICD-10-CM | POA: Diagnosis not present

## 2018-03-28 ENCOUNTER — Other Ambulatory Visit: Payer: Self-pay

## 2018-03-28 ENCOUNTER — Telehealth: Payer: Self-pay | Admitting: Internal Medicine

## 2018-03-28 MED ORDER — SIMVASTATIN 20 MG PO TABS: 20.0000 mg | ORAL_TABLET | Freq: Every day | ORAL | 2 refills | Status: DC

## 2018-03-28 NOTE — Telephone Encounter (Signed)
Copied from CRM [phone]. Topic: General - Other >> Mar 28, 2018 11:58 AM Victor Daniels, Victor Daniels, RMA wrote: Reason for CRM: Medication refill request for simvastatin (ZOCOR) 20 MG tablet to be sent to [facility]

## 2018-04-06 DIAGNOSIS — J449 Chronic obstructive pulmonary disease, unspecified: Secondary | ICD-10-CM | POA: Diagnosis not present

## 2018-04-17 ENCOUNTER — Ambulatory Visit: Payer: Self-pay

## 2018-04-17 NOTE — Telephone Encounter (Signed)
Patient called in with c/o "SOB." He says "for the past week to 10 days, I get more SOB with exertion. If I get up to walk across the room, I get SOB and that's with oxygen on. When I got like this before, I took prednisone and an antibiotic and it helped me." I asked is he wheezing, he says "yes." I asked about the cough and fever, he says "I'm coughing up white phlegm and no fever. I keep breaking out in a sweat several times a day though." I asked about chest pain, he says "I have some pressure on my chest, but not bad pressure." The patient is talking in complete sentences and says he can sleep flat on his side without any increase in SOB. No audible wheezing heard on the phone during the triage call. Patient did not stop and take breaths during the conversation. According to protocol, see PCP within 3 days, no availability with PCP, appointment scheduled for tomorrow at 1145 with Roddie McJulia Kordsmeier, PA, care advice given, patient verbalized understanding.   Reason for Disposition . [1] MODERATE longstanding difficulty breathing (e.g., speaks in phrases, SOB even at rest, pulse 100-120) AND [2] SAME as normal  Answer Assessment - Initial Assessment Questions 1. RESPIRATORY STATUS: "Describe your breathing?" (e.g., wheezing, shortness of breath, unable to speak, severe coughing)      Shortness of breath 2. ONSET: "When did this breathing problem begin?"      Coming on last week to 10 days 3. PATTERN "Does the difficult breathing come and go, or has it been constant since it started?"      SOB with exertion, gotten worse 4. SEVERITY: "How bad is your breathing?" (e.g., mild, moderate, severe)    - MILD: No SOB at rest, mild SOB with walking, speaks normally in sentences, can lay down, no retractions, pulse < 100.    - MODERATE: SOB at rest, SOB with minimal exertion and prefers to sit, cannot lie down flat, speaks in phrases, mild retractions, audible wheezing, pulse 100-120.    - SEVERE: Very SOB at  rest, speaks in single words, struggling to breathe, sitting hunched forward, retractions, pulse > 120      Moderate 5. RECURRENT SYMPTOM: "Have you had difficulty breathing before?" If so, ask: "When was the last time?" and "What happened that time?"      Yes; had to take prednisone and antibiotic 6. CARDIAC HISTORY: "Do you have any history of heart disease?" (e.g., heart attack, angina, bypass surgery, angioplasty)      Yes, had heart attack 7. LUNG HISTORY: "Do you have any history of lung disease?"  (e.g., pulmonary embolus, asthma, emphysema)     COPD w/Oxygen dependency, pulmonary embolus 8. CAUSE: "What do you think is causing the breathing problem?"      Probably fluid on lungs-coughing up white phlegm 9. OTHER SYMPTOMS: "Do you have any other symptoms? (e.g., dizziness, runny nose, cough, chest pain, fever)     Runny nose all the time, cough productive, pressure on chest 10. PREGNANCY: "Is there any chance you are pregnant?" "When was your last menstrual period?"      N/A 11. TRAVEL: "Have you traveled out of the country in the last month?" (e.g., travel history, exposures)      No  Protocols used: BREATHING DIFFICULTY-A-AH

## 2018-04-18 ENCOUNTER — Ambulatory Visit (INDEPENDENT_AMBULATORY_CARE_PROVIDER_SITE_OTHER): Payer: Medicare HMO | Admitting: Family Medicine

## 2018-04-18 ENCOUNTER — Encounter: Payer: Self-pay | Admitting: Family Medicine

## 2018-04-18 VITALS — BP 124/70 | HR 73 | Temp 98.7°F | Resp 15 | Wt 293.4 lb

## 2018-04-18 DIAGNOSIS — J441 Chronic obstructive pulmonary disease with (acute) exacerbation: Secondary | ICD-10-CM

## 2018-04-18 MED ORDER — DOXYCYCLINE HYCLATE 100 MG PO TABS: 100.0000 mg | ORAL_TABLET | Freq: Two times a day (BID) | ORAL | 0 refills | Status: DC

## 2018-04-18 MED ORDER — PREDNISONE 10 MG PO TABS: ORAL_TABLET | ORAL | 0 refills | Status: DC

## 2018-04-18 NOTE — Patient Instructions (Signed)
Please take medication as prescribed.  Avoid sunlight with the medications prescribed as they can cause sun sensitivity.  Contact your pulmonologist for an appointment to review options for improved control of symptoms.  If no improvement or worsening symptoms, please seek medical attention.   Chronic Obstructive Pulmonary Disease Exacerbation Chronic obstructive pulmonary disease (COPD) is a common lung problem. In COPD, the flow of air from the lungs is limited. COPD exacerbations are times that breathing gets worse and you need extra treatment. Without treatment they can be life threatening. If they happen often, your lungs can become more damaged. If your COPD gets worse, your doctor may treat you with:  Medicines.  Oxygen.  Different ways to clear your airway, such as using a mask.  Follow these instructions at home:  Do not smoke.  Avoid tobacco smoke and other things that bother your lungs.  If given, take your antibiotic medicine as told. Finish the medicine even if you start to feel better.  Only take medicines as told by your doctor.  Drink enough fluids to keep your pee (urine) clear or pale yellow (unless your doctor has told you not to).  Use a cool mist machine (vaporizer).  If you use oxygen or a machine that turns liquid medicine into a mist (nebulizer), continue to use them as told.  Keep up with shots (vaccinations) as told by your doctor.  Exercise regularly.  Eat healthy foods.  Keep all doctor visits as told. Get help right away if:  You are very short of breath and it gets worse.  You have trouble talking.  You have bad chest pain.  You have blood in your spit (sputum).  You have a fever.  You keep throwing up (vomiting).  You feel weak, or you pass out (faint).  You feel confused.  You keep getting worse. This information is not intended to replace advice given to you by your health care provider. Make sure you discuss any questions  you have with your health care provider. Document Released: 11/30/2011 Document Revised: 05/18/2016 Document Reviewed: 08/15/2013 Elsevier Interactive Patient Education  2017 [employer]
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
## 2018-05-06 DIAGNOSIS — J449 Chronic obstructive pulmonary disease, unspecified: Secondary | ICD-10-CM | POA: Diagnosis not present

## 2018-05-06 DIAGNOSIS — M1611 Unilateral primary osteoarthritis, right hip: Secondary | ICD-10-CM | POA: Diagnosis not present

## 2018-05-06 DIAGNOSIS — M25552 Pain in left hip: Secondary | ICD-10-CM | POA: Diagnosis not present

## 2018-05-15 ENCOUNTER — Other Ambulatory Visit: Payer: Self-pay | Admitting: Orthopedic Surgery

## 2018-05-15 DIAGNOSIS — M25552 Pain in left hip: Secondary | ICD-10-CM

## 2018-05-17 ENCOUNTER — Encounter: Payer: Self-pay | Admitting: Internal Medicine

## 2018-05-17 ENCOUNTER — Ambulatory Visit (INDEPENDENT_AMBULATORY_CARE_PROVIDER_SITE_OTHER): Payer: Medicare HMO | Admitting: Internal Medicine

## 2018-05-17 VITALS — BP 150/80 | HR 86 | Temp 98.7°F | Ht 72.0 in | Wt 289.4 lb

## 2018-05-17 DIAGNOSIS — J441 Chronic obstructive pulmonary disease with (acute) exacerbation: Secondary | ICD-10-CM | POA: Diagnosis not present

## 2018-05-17 MED ORDER — HYDROCODONE-HOMATROPINE 5-1.5 MG/5ML PO SYRP: 5.0000 mL | ORAL_SOLUTION | Freq: Every evening | ORAL | 0 refills | Status: DC | PRN

## 2018-05-17 MED ORDER — LEVOFLOXACIN 750 MG PO TABS: 750.0000 mg | ORAL_TABLET | Freq: Every day | ORAL | 0 refills | Status: DC

## 2018-05-17 MED ORDER — METHYLPREDNISOLONE ACETATE 40 MG/ML IJ SUSP
40.0000 mg | Freq: Once | INTRAMUSCULAR | Status: AC
  Administered 2018-05-17: 40 mg via INTRAMUSCULAR

## 2018-05-17 NOTE — Addendum Note (Signed)
Addended by: Elise Benne T on: 05/17/2018 10:45 AM   Modules accepted: Orders

## 2018-05-17 NOTE — Progress Notes (Signed)
Pre visit review using our clinic review tool, if applicable. No additional management support is needed unless otherwise documented below in the visit note. 
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
## 2018-05-17 NOTE — Patient Instructions (Addendum)
If you do not get better after this I rec appt with your lung doctor of Dr. Belia Heman or Dr. Ardyth Man with [facility] pulmonology  And consider CT chest  Hycodan at night 5 ml as needed  levaquin 750 mg daily x 5 days with food  You were given depomedrol 40 mg x 1    Chronic Obstructive Pulmonary Disease Exacerbation Chronic obstructive pulmonary disease (COPD) is a long-term (chronic) condition that affects the lungs. COPD is a general term that can be used to describe many different lung problems that cause lung swelling (inflammation) and limit airflow, including chronic bronchitis and emphysema. COPD exacerbations are episodes when breathing symptoms become much worse and require extra treatment. COPD exacerbations are usually caused by infections. Without treatment, COPD exacerbations can be severe and even life threatening. Frequent COPD exacerbations can cause further damage to the lungs. What are the causes? This condition may be caused by:  Respiratory infections, including viral and bacterial infections.  Exposure to smoke.  Exposure to air pollution, chemical fumes, or dust.  Things that give you an allergic reaction (allergens).  Not taking your usual COPD medicines as directed.  Underlying medical problems, such as congestive heart failure or infections not involving the lungs.  In many cases, the cause (trigger) of this condition is not known. What increases the risk? The following factors may make you more likely to develop this condition:  Smoking cigarettes.  Old age.  Frequent prior COPD exacerbations.  What are the signs or symptoms? Symptoms of this condition include:  Increased coughing.  Increased production of mucus from your lungs (sputum).  Increased wheezing.  Increased shortness of breath.  Rapid or labored breathing.  Chest tightness.  Less energy than usual.  Sleep disruption from symptoms.  Confusion or increased sleepiness.  Often these  symptoms happen or get worse even with the use of medicines. How is this diagnosed? This condition is diagnosed based on:  Your medical history.  A physical exam.  You may also have tests, including:  A chest X-ray.  Blood tests.  Lung (pulmonary) function tests.  How is this treated? Treatment for this condition depends on the severity and cause of the symptoms. You may need to be admitted to a hospital for treatment. Some of the treatments commonly used to treat COPD exacerbations are:  Antibiotic medicines. These may be used for severe exacerbations caused by a lung infection, such as pneumonia.  Bronchodilators. These are inhaled medicines that expand the air passages and allow increased airflow.  Steroid medicines. These act to reduce inflammation in the airways. They may be given with an inhaler, taken by mouth, or given through an IV tube inserted into one of your veins.  Supplemental oxygen therapy.  Airway clearing techniques, such as noninvasive ventilation (NIV) and positive expiratory pressure (PEP). These provide respiratory support through a mask or other noninvasive device. An example of this would be using a continuous positive airway pressure (CPAP) machine to improve delivery of oxygen into your lungs.  Follow these instructions at home: Medicines  Take over-the-counter and prescription medicines only as told by your health care provider. It is important to use correct technique with inhaled medicines.  If you were prescribed an antibiotic medicine or oral steroid, take it as told by your health care provider. Do not stop taking the medicine even if you start to feel better. Lifestyle  Eat a healthy diet.  Exercise regularly.  Get plenty of sleep.  Avoid exposure to all substances  that irritate the airway, especially to tobacco smoke.  Wash your hands often with soap and water to reduce the risk of infection. If soap and water are not available, use hand  sanitizer.  During flu season, avoid enclosed spaces that are crowded with people. General instructions  Drink enough fluid to keep your urine clear or pale yellow (unless you have a medical condition that requires fluid restriction).  Use a cool mist vaporizer. This humidifies the air and makes it easier for you to clear your chest when you cough.  If you have a home nebulizer and oxygen, continue to use them as told by your health care provider.  Keep all follow-up visits as told by your health care provider. This is important. How is this prevented?  Stay up-to-date on pneumococcal and influenza (flu) vaccines. A flu shot is recommended every year to help prevent exacerbations.  Do not use any products that contain nicotine or tobacco, such as cigarettes and e-cigarettes. Quitting smoking is very important in preventing COPD from getting worse and in preventing exacerbations from happening as often. If you need help quitting, ask your health care provider.  Follow all instructions for pulmonary rehabilitation after a recent exacerbation. This can help prevent future exacerbations.  Work with your health care provider to develop and follow an action plan. This tells you what steps to take when you experience certain symptoms. Contact a health care provider if:  You have a worsening of your regular COPD symptoms. Get help right away if:  You have worsening shortness of breath, even when resting.  You have trouble talking.  You have severe chest pain.  You cough up blood.  You have a fever.  You have weakness, vomit repeatedly, or faint.  You feel confused.  You are not able to sleep because of your symptoms.  You have trouble doing daily activities. Summary  COPD exacerbations are episodes when breathing symptoms become much worse and require extra treatment above your normal treatment.  Exacerbations can be severe and even life threatening. Frequent COPD exacerbations  can cause further damage to your lungs.  COPD exacerbations are usually triggered by infections such as the flu, colds, and even pneumonia.  Treatment for this condition depends on the severity and cause of the symptoms. You may need to be admitted to a hospital for treatment.  Quitting smoking is very important to prevent COPD from getting worse and to prevent exacerbations from happening as often. This information is not intended to replace advice given to you by your health care provider. Make sure you discuss any questions you have with your health care provider. Document Released: 10/08/2007 Document Revised: 01/15/2017 Document Reviewed: 01/15/2017 Elsevier Interactive Patient Education  [employer].

## 2018-05-21 ENCOUNTER — Telehealth: Payer: Self-pay | Admitting: Internal Medicine

## 2018-05-21 NOTE — Telephone Encounter (Signed)
Copied from CRM [id number]. Topic: Quick Communication - Rx Refill/Question >> May 21, 2018 12:39 PM Victor Daniels, Rosey Bath D wrote: Medication: HYDROcodone-homatropine (HYCODAN) 5-1.5 MG/5ML syrup, levofloxacin (LEVAQUIN) 750 MG tablet Has the patient contacted their pharmacy? Yes, patient would like a second round to make him feel better. (Agent: If no, request that the patient contact the pharmacy for the refill.) (Agent: If yes, when and what did the pharmacy advise?)  Preferred Pharmacy (with phone number or street name): [id number] - [address] ([address]), [place] - [address]  Agent: Please be advised that RX refills may take up to 3 business days. We ask that you follow-up with your pharmacy.

## 2018-05-22 ENCOUNTER — Ambulatory Visit: Payer: Self-pay

## 2018-05-22 NOTE — Telephone Encounter (Signed)
fyi

## 2018-05-22 NOTE — Telephone Encounter (Signed)
Denied.  He has had three (3)  rounds of antibiotics since march. He needs  A CT along with an evaluation by  his pulmonologist as advised by Dr Shirlee Latch during his last treatment . I will order the chest CT if he is willing to have it done.

## 2018-05-22 NOTE — Telephone Encounter (Signed)
Pt is wanting to know if he can have another round of antibiotic and cough medicine. He stated that he feels better but is still coughing and thinks that one more round of medicine will clear him up.

## 2018-05-22 NOTE — Telephone Encounter (Signed)
Pt. He is feeling better, "but I'm still coughing. I need one more round of antibiotic and some more cough medicine and I'll be fine." Using inhaler and nebulizer as ordered. Denies fever. Productive cough with clear mucus. Reports the pharmacy did not fill his full amount of cough syrup because "the amount was too large."  Answer Assessment - Initial Assessment Questions 1. ONSET: "When did the cough begin?"      Started last week 2. SEVERITY: "How bad is the cough today?"      My cough is better 3. RESPIRATORY DISTRESS: "Describe your breathing."      No 4. FEVER: "Do you have a fever?" If so, ask: "What is your temperature, how was it measured, and when did it start?"     No 5. SPUTUM: "Describe the color of your sputum" (clear, white, yellow, green)     Clear 6. HEMOPTYSIS: "Are you coughing up any blood?" If so ask: "How much?" (flecks, streaks, tablespoons, etc.)     No 7. CARDIAC HISTORY: "Do you have any history of heart disease?" (e.g., heart attack, congestive heart failure)      Heart attack, CVA 2010 8. LUNG HISTORY: "Do you have any history of lung disease?"  (e.g., pulmonary embolus, asthma, emphysema)     COPD 9. PE RISK FACTORS: "Do you have a history of blood clots?" (or: recent major surgery, recent prolonged travel, bedridden )     2010 10. OTHER SYMPTOMS: "Do you have any other symptoms?" (e.g., runny nose, wheezing, chest pain)       Some wheezing, some runny nose 11. PREGNANCY: "Is there any chance you are pregnant?" "When was your last menstrual period?"       n/a 12. TRAVEL: "Have you traveled out of the country in the last month?" (e.g., travel history, exposures)       No  Protocols used: COUGH - ACUTE PRODUCTIVE-A-AH

## 2018-05-23 NOTE — Telephone Encounter (Signed)
Spoke with pt to let him know that another round of antibiotics would not be filled since he had been on 3 rounds since March. Explained to pt that Dr. Darrick Huntsman advised a chest CT for further evaluation and a follow up with his pulmonologist. The pt declined the chest CT. Pt stated that he feels like a "chest dummy because every time I turn around they are doing another test and doing is ever figured out".

## 2018-05-24 ENCOUNTER — Telehealth: Payer: Self-pay | Admitting: Internal Medicine

## 2018-05-24 DIAGNOSIS — J441 Chronic obstructive pulmonary disease with (acute) exacerbation: Secondary | ICD-10-CM

## 2018-05-24 NOTE — Telephone Encounter (Signed)
CT ordered. 

## 2018-05-24 NOTE — Telephone Encounter (Signed)
Left message for pt to return call to inform pt that CT has been ordered by PCP. PEC ok to speak with pt.

## 2018-05-24 NOTE — Telephone Encounter (Signed)
Copied from CRM [id number]. Topic: General - Other >> May 24, 2018  7:03 AM Leafy Roobinson, Norma J wrote: Reason for CRM: pt is calling and he has changed his mind and would like to proceed with getting chest ct done.

## 2018-05-24 NOTE — Telephone Encounter (Signed)
Please advise 

## 2018-05-24 NOTE — Telephone Encounter (Signed)
Pt has decided that he would like to go forward with the chest CT.

## 2018-05-27 NOTE — Telephone Encounter (Signed)
Patient returned call, I spoke with Patina & she said that he will be called to schedule

## 2018-05-27 NOTE — Telephone Encounter (Signed)
Attempted to call. No answer no voicemail. 

## 2018-05-28 ENCOUNTER — Other Ambulatory Visit: Payer: Self-pay | Admitting: Internal Medicine

## 2018-05-28 ENCOUNTER — Telehealth: Payer: Self-pay | Admitting: Internal Medicine

## 2018-05-28 NOTE — Telephone Encounter (Signed)
Copied from CRM [id number]. Topic: Quick Communication - Rx Refill/Question >> May 28, 2018  9:23 AM Floria RavelingStovall, Shana A wrote: Medication: losartan-hydrochlorothiazide (HYZAAR) 100-25 MG tablet [[id number]][[id number]]    Has the patient contacted their pharmacy? No  (Agent: If no, request that the patient contact the pharmacy for the refill.) (Agent: If yes, when and what did the pharmacy advise?)  Preferred Pharmacy (with phone number or street name):[facility] - [place] - [id number] [phone] (Phone)   Agent: Please be advised that RX refills may take up to 3 business days. We ask that you follow-up with your pharmacy.

## 2018-05-29 ENCOUNTER — Ambulatory Visit: Payer: Medicare HMO

## 2018-05-29 NOTE — Telephone Encounter (Signed)
Filled on 6/5

## 2018-06-04 ENCOUNTER — Ambulatory Visit
Admission: RE | Admit: 2018-06-04 | Discharge: 2018-06-04 | Disposition: A | Discharge status: Home / Self Care | Payer: Medicare HMO | Source: Ambulatory Visit | Attending: Internal Medicine | Admitting: Internal Medicine

## 2018-06-04 DIAGNOSIS — J441 Chronic obstructive pulmonary disease with (acute) exacerbation: Secondary | ICD-10-CM | POA: Insufficient documentation

## 2018-06-04 DIAGNOSIS — I2584 Coronary atherosclerosis due to calcified coronary lesion: Secondary | ICD-10-CM | POA: Diagnosis not present

## 2018-06-04 DIAGNOSIS — M4814 Ankylosing hyperostosis [Forestier], thoracic region: Secondary | ICD-10-CM | POA: Insufficient documentation

## 2018-06-04 DIAGNOSIS — I2510 Atherosclerotic heart disease of native coronary artery without angina pectoris: Secondary | ICD-10-CM | POA: Diagnosis not present

## 2018-06-04 DIAGNOSIS — I700 Atherosclerosis of aorta: Secondary | ICD-10-CM | POA: Insufficient documentation

## 2018-06-04 DIAGNOSIS — J449 Chronic obstructive pulmonary disease, unspecified: Secondary | ICD-10-CM | POA: Diagnosis not present

## 2018-06-06 DIAGNOSIS — J449 Chronic obstructive pulmonary disease, unspecified: Secondary | ICD-10-CM | POA: Diagnosis not present

## 2018-06-10 ENCOUNTER — Ambulatory Visit: Payer: Medicare HMO | Admitting: Internal Medicine

## 2018-06-10 ENCOUNTER — Encounter: Payer: Self-pay | Admitting: Internal Medicine

## 2018-06-10 VITALS — BP 138/90 | HR 80 | Ht 72.0 in | Wt 289.0 lb

## 2018-06-10 DIAGNOSIS — J441 Chronic obstructive pulmonary disease with (acute) exacerbation: Secondary | ICD-10-CM | POA: Diagnosis not present

## 2018-06-10 MED ORDER — AZITHROMYCIN 250 MG PO TABS: ORAL_TABLET | ORAL | 0 refills | Status: DC

## 2018-06-10 MED ORDER — PREDNISONE 20 MG PO TABS: 40.0000 mg | ORAL_TABLET | Freq: Every day | ORAL | 0 refills | Status: DC

## 2018-06-10 NOTE — Patient Instructions (Signed)
Prednisone 40 mg daily for 10 days Z pak Continue neb therapy  Follow up CXR in 3 months

## 2018-06-10 NOTE — Progress Notes (Signed)
PULMONARY OFFICE FOLLOW UP NOTE  Requesting MD/Service: [name] Date of initial consultation: 03/20/16 Reason for consultation: COPD, Dyspnea  PT PROFILE: 72 y.o. M former smoker, previously followed by [name] (last seen in 2014) with obesity, COPD (FEV1 40% predicted in 2011) and probable OSA re-evaluated 03/20/16 for progressive DOE. Also with prior hx of cardiomyopathy (LVEF 26% in 2010 but normal 03/02/16)  DATA: 05/17/16 PFTs: severe airflow limitation due to mixed obstruction and restriction. Lung volumes moderately reduced 05/19/16 CXR: Chronic changes in R base 01/02/17 PSG: severe OSA. AHI 65/hr 02/13/17 CPAP titration: recommended CPAP 13 cm H2O  INTERVAL: Last seen 09/2016.  No major pulmonary events since that time.  SUBJ: +increased wheezing +productive cough  +increased SOB/DOE  CT chest wit Vague LUL GGO that is probably not related to current symptoms Has been on several rounds of ABX   Compliant  with NEB therapy Since last seen, he is undergone sleep study and CPAP titration study as reported above.  This was under the direction of Dr. [name].  However, he has refused CPAP therapy.    He remains on budesonide and arformoterol nebulized twice a day.  He has an albuterol rescue inhaler which he uses several times per week.     He remains on oxygen therapy which he uses with sleep and as needed with exertion. He continues to have class III/IV dyspnea which is unchanged from previously. OBJ:  Vitals:   06/10/18 1324  Weight: 289 lb (131.1 kg)  Height: 6' (1.829 m)  2 LPM [place] BP 138/90 (BP Location: Left Arm, Cuff Size: Normal)   Pulse 80   Ht 6' (1.829 m)   Wt 289 lb (131.1 kg)   SpO2 92%   BMI 39.20 kg/m   EXAM:  Gen: Obese, NAD HEENT: NCAT Neck: No JVD Lungs: breath sounds moderately diminished, +RT lung wheezing Cardiovascular: Reg, no M Abdomen: obese, soft, nontender, normal BS Ext: Chronic stasis changes, no clubbing or cyanosis, 2-3+  symmetric edema Neuro: grossly intact  DATA:   BMP Latest Ref Rng & Units 12/12/2017 09/12/2017 06/12/2017  Glucose 70 - 99 mg/dL 97 045(W117(H) 098(J117(H)  BUN 6 - 23 mg/dL 19(J27(H) 20 21  Creatinine 0.40 - 1.50 mg/dL 4.781.24 2.951.06 6.211.01  Sodium 135 - 145 mEq/L 141 141 142  Potassium 3.5 - 5.1 mEq/L 3.6 3.6 3.7  Chloride 96 - 112 mEq/L 97 96 99  CO2 19 - 32 mEq/L 39(H) 39(H) 36(H)  Calcium 8.4 - 10.5 mg/dL 9.3 9.5 9.8    CBC Latest Ref Rng & Units 02/23/2016 06/25/2015 04/04/2015  WBC 4.0 - 10.5 K/uL 7.6 7.0 11.0(H)  Hemoglobin 13.0 - 17.0 g/dL 12.6(L) 13.6 11.0(L)  Hematocrit 39.0 - 52.0 % 37.6(L) 41.2 35.1(L)  Platelets 150.0 - 400.0 K/uL 182.0 181.0 150   CT chest chronic RML opacity and LUL vague upper lobe opacity  IMPRESSION:   1) Moderate COPD 2) chronic RML/ RLL scarring 3) class II/IV dyspnea 4) obstructive sleep apnea  5) chronic hypoxemic respiratory failure benefiting from oxygen therapy   PLAN:  1) continue current regimen of Pulmicort, Brovana and PRN albuterol. We discussed trial of addition of LAMA such as Spiriva but he refuses due to concern re: cost 2) Cont O2 at 2 LPM with exertion and sleep.  3) I encouraged that he reconsider trial of CPAP but he again refuses  4) COPD exacerbation -prednisone and z pak therapy  If patient does NOT improve with prednisone and ABX, will need to consider  Bronchoscopy for airway sampling to assess for atypical infections.  Case discussed with Dr. Sung Amabile Follow up in 3 months with CXR   Patient satisfied with Plan of action and management. All questions answered  Lucie Leather, M.D.  [facility]  Medical Director [facility] Medical Director [facility] Cardio-Pulmonary Department

## 2018-06-14 ENCOUNTER — Ambulatory Visit (INDEPENDENT_AMBULATORY_CARE_PROVIDER_SITE_OTHER): Payer: Medicare HMO | Admitting: Internal Medicine

## 2018-06-14 ENCOUNTER — Encounter: Payer: Self-pay | Admitting: Internal Medicine

## 2018-06-14 DIAGNOSIS — E1159 Type 2 diabetes mellitus with other circulatory complications: Secondary | ICD-10-CM | POA: Diagnosis not present

## 2018-06-14 DIAGNOSIS — R05 Cough: Secondary | ICD-10-CM

## 2018-06-14 DIAGNOSIS — R058 Other specified cough: Secondary | ICD-10-CM

## 2018-06-14 DIAGNOSIS — M4722 Other spondylosis with radiculopathy, cervical region: Secondary | ICD-10-CM

## 2018-06-14 MED ORDER — IPRATROPIUM-ALBUTEROL 0.5-2.5 (3) MG/3ML IN SOLN: 3.0000 mL | Freq: Four times a day (QID) | RESPIRATORY_TRACT | 1 refills | Status: DC | PRN

## 2018-06-14 MED ORDER — OXYCODONE HCL 15 MG PO TABS: 15.0000 mg | ORAL_TABLET | Freq: Three times a day (TID) | ORAL | 0 refills | Status: DC | PRN

## 2018-06-14 MED ORDER — AZITHROMYCIN 500 MG PO TABS: ORAL_TABLET | ORAL | 0 refills | Status: DC

## 2018-06-14 NOTE — Patient Instructions (Signed)
I am extending the antibiotic azithromycin for 5 more days  I am changing your albuterol nebulizer to "Duoneb" which contains atrovent and my help resolve  The mucus   If no better in a week Let Dr Belia HemanKasa know because the sweats you are having may be due to an infection that you may need to undergo bronchoscopy to find   I have refilled your pain medication for  June July and August

## 2018-06-14 NOTE — Progress Notes (Signed)
Subjective:  Patient ID: Victor Daniels, male    DOB: 1946/08/07  Age: 72 y.o. MRN: [id number]  CC: Diagnoses of Type 2 diabetes mellitus with other circulatory complication, without long-term current use of insulin (HCC), Cervical radiculopathy due to degenerative joint disease of spine, and Cough present for greater than 3 weeks were pertinent to this visit.  HPI SABA NEUMAN presents for persistent productive cough since early February despite multiple rounds of antibitocs and steroids.  Currently on Day 5  Of a prednisone taper started on June 17  by Pulmonology after  3 months of recurrent treatment failed.  Bronchoscopy was suggested as next step if sympotms did not resolve   To rule out atypical infection .  Has been having soaking sweats  Day and night ,  persistnet productive sounding cough BUT CANNOT PRODUCE MUCUS  Recalls discovering that he had been drinking through a rubber straw that had not been ashed for several months and discovered mold  Growing on the inside of it a little over a month ago.  He was unable to forward this information to his pulmonologist at his last visit.   worried about his wife who has heart trouble She will not allow him to accompany her to visits with doctor.   Having joint pain involving both hands and arms ,  Radiates up to shoulders. History of prior trauma.   Chronic back and neck pain.  Managed with  Oxycodone 3 times daily. Refill history confirmed via [place] Controlled Substance databas, accessed by me today..     Outpatient Medications Prior to Visit  Medication Sig Dispense Refill  . amLODipine (NORVASC) 5 MG tablet TAKE 1 TABLET EVERY DAY 90 tablet 3  . arformoterol (BROVANA) 15 MCG/2ML NEBU Take 2 mLs (15 mcg total) by nebulization 2 (two) times daily. DX: COPD  DX Code: J44.9 120 mL 5  . aspirin 81 MG tablet Take 81 mg by mouth daily.    . budesonide (PULMICORT) 0.25 MG/2ML nebulizer solution Take 2 mLs (0.25 mg total) by  nebulization 2 (two) times daily. DX: COPD DX Code: J44.9 120 mL 5  . carvedilol (COREG) 12.5 MG tablet TAKE 1 TABLET TWICE DAILY WITH MEALS 180 tablet 1  . docusate sodium (COLACE) 100 MG capsule Take 2 capsules (200 mg total) by mouth daily. 60 capsule 2  . furosemide (LASIX) 20 MG tablet Take 1 tablet (20 mg total) by mouth daily as needed. (Patient taking differently: Take 20 mg by mouth every other day. ) 90 tablet 3  . HYDROcodone-homatropine (HYCODAN) 5-1.5 MG/5ML syrup Take 5 mLs by mouth at bedtime as needed for cough. 120 mL 0  . ipratropium (ATROVENT) 0.03 % nasal spray PLACE 2 SPRAYS INTO BOTH NOSTRILS EVERY 12 (TWELVE) HOURS. 60 mL 12  . Lactulose 20 GM/30ML SOLN 30 ml every 4 hours until constipation is relieved 236 mL 3  . losartan-hydrochlorothiazide (HYZAAR) 100-25 MG tablet TAKE 1 TABLET EVERY DAY 90 tablet 3  . methocarbamol (ROBAXIN) 500 MG tablet Take 1 tablet (500 mg total) by mouth every 8 (eight) hours as needed for muscle spasms. 90 tablet 1  . Multiple Vitamin (MULTIVITAMIN) capsule Take 1 capsule by mouth daily.    . nitroGLYCERIN (NITROSTAT) 0.4 MG SL tablet Place 1 tablet (0.4 mg total) under the tongue every 5 (five) minutes as needed for chest pain. Maximum dose 3 tablets 50 tablet 3  . omeprazole (PRILOSEC) 40 MG capsule Take 1 capsule (40 mg total) by mouth  daily. (Patient taking differently: Take 40 mg by mouth daily as needed. ) 30 capsule 5  . oxyCODONE (ROXICODONE) 15 MG immediate release tablet Take 1 tablet (15 mg total) by mouth every 8 (eight) hours as needed for pain. 90 tablet 0  . predniSONE (DELTASONE) 20 MG tablet Take 2 tablets (40 mg total) by mouth daily with breakfast. 10 days 20 tablet 0  . simvastatin (ZOCOR) 20 MG tablet Take 1 tablet (20 mg total) by mouth at bedtime. 90 tablet 2  . tiZANidine (ZANAFLEX) 4 MG tablet Take 1 tablet (4 mg total) by mouth every 6 (six) hours as needed for muscle spasms. 360 tablet 0  . albuterol (PROAIR HFA) 108 (90  Base) MCG/ACT inhaler Inhale 2 puffs into the lungs every 6 (six) hours as needed for wheezing or shortness of breath. 18 g 5  . albuterol (PROVENTIL) (2.5 MG/3ML) 0.083% nebulizer solution Take 3 mLs (2.5 mg total) by nebulization every 6 (six) hours as needed for wheezing or shortness of breath. 150 mL 1  . oxyCODONE (ROXICODONE) 15 MG immediate release tablet Take 1 tablet (15 mg total) by mouth every 8 (eight) hours as needed for pain. 90 tablet 0  . oxyCODONE (ROXICODONE) 15 MG immediate release tablet Take 1 tablet (15 mg total) by mouth every 8 (eight) hours as needed for pain. 90 tablet 0  . oxyCODONE (ROXICODONE) 15 MG immediate release tablet Take 1 tablet (15 mg total) by mouth every 8 (eight) hours as needed for pain. 90 tablet 0  . azithromycin (ZITHROMAX Z-PAK) 250 MG tablet Take 2 tablets on Day 1 and then 1 tablet daily till gone. (Patient not taking: Reported on 06/14/2018) 6 each 0   No facility-administered medications prior to visit.     Review of Systems;  Patient denies headache, fevers, malaise, unintentional weight loss, skin rash, eye pain, sinus congestion and sinus pain, sore throat, dysphagia,  hemoptysis , cough, dyspnea, wheezing, chest pain, palpitations, orthopnea, edema, abdominal pain, nausea, melena, diarrhea, constipation, flank pain, dysuria, hematuria, urinary  Frequency, nocturia, numbness, tingling, seizures,  Focal weakness, Loss of consciousness,  Tremor, insomnia, depression, anxiety, and suicidal ideation.      Objective:  BP 140/74 (BP Location: Left Arm, Patient Position: Sitting, Cuff Size: Large)   Pulse 79   Temp 98.4 F (36.9 C) (Oral)   Resp 17   Ht 6' (1.829 m)   Wt 291 lb 6.4 oz (132.2 kg)   SpO2 90%   BMI 39.52 kg/m   BP Readings from Last 3 Encounters:  06/14/18 140/74  06/10/18 138/90  05/17/18 (!) 150/80    Wt Readings from Last 3 Encounters:  06/14/18 291 lb 6.4 oz (132.2 kg)  06/10/18 289 lb (131.1 kg)  05/17/18 289 lb 6.4  oz (131.3 kg)    General appearance: alert, cooperative and appears stated age Ears: normal TM's and external ear canals both ears Throat: lips, mucosa, and tongue normal; teeth and gums normal Neck: no adenopathy, no carotid bruit, supple, symmetrical, trachea midline and thyroid not enlarged, symmetric, no tenderness/mass/nodules Back: symmetric, no curvature. ROM normal. No CVA tenderness. Lungs: clear to auscultation bilaterally Heart: regular rate and rhythm, S1, S2 normal, no murmur, click, rub or gallop Abdomen: soft, non-tender; bowel sounds normal; no masses,  no organomegaly Pulses: 2+ and symmetric Skin: Skin color, texture, turgor normal. No rashes or lesions Lymph nodes: Cervical, supraclavicular, and axillary nodes normal.  Lab Results  Component Value Date   HGBA1C 6.4 12/12/2017  HGBA1C 6.4 09/12/2017   HGBA1C 6.4 06/12/2017    Lab Results  Component Value Date   CREATININE 1.24 12/12/2017   CREATININE 1.06 09/12/2017   CREATININE 1.01 06/12/2017    Lab Results  Component Value Date   WBC 7.6 02/23/2016   HGB 12.6 (L) 02/23/2016   HCT 37.6 (L) 02/23/2016   PLT 182.0 02/23/2016   GLUCOSE 97 12/12/2017   CHOL 151 06/12/2017   TRIG 77.0 06/12/2017   HDL 53.30 06/12/2017   LDLDIRECT 72.0 12/01/2016   LDLCALC 82 06/12/2017   ALT 20 12/12/2017   AST 18 12/12/2017   NA 141 12/12/2017   K 3.6 12/12/2017   CL 97 12/12/2017   CREATININE 1.24 12/12/2017   BUN 27 (H) 12/12/2017   CO2 39 (H) 12/12/2017   TSH 0.62 06/12/2017   INR 0.9 04/02/2015   HGBA1C 6.4 12/12/2017   MICROALBUR <0.7 12/12/2017    Ct Chest Wo Contrast  Result Date: 06/05/2018 CLINICAL DATA:  72 year old male with cough for 3-4 weeks, treated with antibiotics. COPD exacerbation. Prior right lung surgery. On 2 liters of home oxygen. EXAM: CT CHEST WITHOUT CONTRAST TECHNIQUE: Multidetector CT imaging of the chest was performed following the standard protocol without IV contrast. COMPARISON:   Chest radiographs 03/13/2018, CT 03/05/2015. FINDINGS: Cardiovascular: Chronic calcified coronary artery atherosclerosis. Mild Calcified aortic atherosclerosis. Stable cardiac size, within normal limits. No mediastinal that no pericardial effusion. Mediastinum/Nodes: Negative noncontrast thoracic inlet. Stable small mediastinal lymph nodes, no lymphadenopathy. Lungs/Pleura: The major airways are patent and appear stable since 2016. Chronic peribronchial thickening. Chronic scarring and architectural distortion right middle lobe with round atelectasis is stable to mildly progressed. Calcified posterior right pleural thickening is chronic and associated with chronic lower lobe linear scarring. No acute right lung opacity. There is no acute opacity in the left lung. There is chronic round atelectasis and curvilinear scarring in the posterior basal segment of the left lower lobe which is stable. Mild associated noncalcified pleural thickening. In the posterior left upper lobe on series 3, image 45 there is a 10-12 millimeter rounded area of faint ground-glass opacity which is chronic but has increased from 8 millimeters in 2014. No pleural effusion. Upper Abdomen: Negative visible noncontrast liver, gallbladder, spleen, pancreas, adrenal glands, and bowel in the upper abdomen. Musculoskeletal: Chronic flowing osteophytes in the thoracic spine resulting in ankylosis. Partially visible cervical ACDF. Vacuum disc in the unfused lower cervical spine. Upper thoracic vacuum facet phenomena. Advanced degenerative changes at both shoulders, severe glenohumeral joint degeneration on the right. No acute osseous abnormality identified. IMPRESSION: 1. No acute lung infection or inflammation identified. Chronic lung disease including chronic peribronchial thickening, calcified pleural plaques, round atelectasis, scarring. 2. A 12 mm rounded area of faint ground-glass opacity in the posterior left upper lobe has persisted and slowly  enlarged since 2014. Indolent Adenocarcinoma cannot be excluded. Consider Thoracic Surgery consultation. 3. Chronic calcified coronary artery atherosclerosis. Mild Calcified aortic atherosclerosis. 4. Diffuse idiopathic skeletal hyperostosis (DISH) with associated thoracic spine ankylosis. Electronically Signed   By: Odessa Fleming M.D.   On: 06/05/2018 08:07    Assessment & Plan:   Problem List Items Addressed This Visit    Diabetes mellitus with circulatory complication (HCC)     Remains  well-controlled on diet alone . Patient is up-to-date on eye exams and foot exam is normal today. Patient has no microalbuminuria. Patient is tolerating statin therapy for CAD risk reduction and on ACE/ARB for renal protection and hypertension  Continue asa and statin.  He has no proteinuria and has annual eye exams. He has deferred the final Pneumovax vaccine.   Lab Results  Component Value Date   HGBA1C 6.4 12/12/2017   Lab Results  Component Value Date   MICROALBUR <0.7 12/12/2017         Cough present for greater than 3 weeks    Extending azithromycin 500 mg daily x 5 days.  Still on prednisone taper . DDX includes atypical pneumonia and aspergillus or other mold .   May need bronchoscopy      Cervical radiculopathy due to degenerative joint disease of spine    Now with intermittent pain and  numbness occurring in the right hand. Unclear whether his current pain is radiculopathy from progressive spinal stenosis or due to  Unresolved MSK issues for right shoulder rotator cuff surgery or plain OA.   Refill history confirmed via [place] Controlled Substance database, accessed by me today, and 3 months of oxycodone 15 mg  Tid  refills given. #90/month .         I have discontinued Tawni Millers. Mester's albuterol, albuterol, and azithromycin. I am also having him start on azithromycin and ipratropium-albuterol. Additionally, I am having him maintain his aspirin, nitroGLYCERIN, multivitamin, omeprazole,  budesonide, arformoterol, ipratropium, tiZANidine, amLODipine, furosemide, docusate sodium, oxyCODONE, methocarbamol, Lactulose, carvedilol, simvastatin, HYDROcodone-homatropine, losartan-hydrochlorothiazide, predniSONE, oxyCODONE, oxyCODONE, and oxyCODONE.  Meds ordered this encounter  Medications  . azithromycin (ZITHROMAX) 500 MG tablet    Sig: One tablet daily    Dispense:  5 tablet    Refill:  0  . ipratropium-albuterol (DUONEB) 0.5-2.5 (3) MG/3ML SOLN    Sig: Take 3 mLs by nebulization every 6 (six) hours as needed.    Dispense:  360 mL    Refill:  1  . oxyCODONE (ROXICODONE) 15 MG immediate release tablet    Sig: Take 1 tablet (15 mg total) by mouth every 8 (eight) hours as needed for pain.    Dispense:  90 tablet    Refill:  0    May refill on or after June 14 2018  . oxyCODONE (ROXICODONE) 15 MG immediate release tablet    Sig: Take 1 tablet (15 mg total) by mouth every 8 (eight) hours as needed for pain.    Dispense:  90 tablet    Refill:  0    May refill on or after July 14 2018  . oxyCODONE (ROXICODONE) 15 MG immediate release tablet    Sig: Take 1 tablet (15 mg total) by mouth every 8 (eight) hours as needed for pain.    Dispense:  90 tablet    Refill:  0    May refill on or after August 13 2018    Medications Discontinued During This Encounter  Medication Reason  . azithromycin (ZITHROMAX Z-PAK) 250 MG tablet Completed Course  . albuterol (PROVENTIL) (2.5 MG/3ML) 0.083% nebulizer solution   . albuterol (PROAIR HFA) 108 (90 Base) MCG/ACT inhaler   . oxyCODONE (ROXICODONE) 15 MG immediate release tablet Reorder  . oxyCODONE (ROXICODONE) 15 MG immediate release tablet Reorder  . oxyCODONE (ROXICODONE) 15 MG immediate release tablet Reorder    Follow-up: Return in about 3 months (around 09/14/2018) for med refill .   [name], MD

## 2018-06-16 DIAGNOSIS — R05 Cough: Secondary | ICD-10-CM | POA: Insufficient documentation

## 2018-06-16 DIAGNOSIS — R058 Other specified cough: Secondary | ICD-10-CM | POA: Insufficient documentation

## 2018-06-16 NOTE — Assessment & Plan Note (Signed)
Extending azithromycin 500 mg daily x 5 days.  Still on prednisone taper . DDX includes atypical pneumonia and aspergillus or other mold .   May need bronchoscopy

## 2018-06-16 NOTE — Assessment & Plan Note (Signed)
Now with intermittent pain and  numbness occurring in the right hand. Unclear whether his current pain is radiculopathy from progressive spinal stenosis or due to  Unresolved MSK issues for right shoulder rotator cuff surgery or plain OA.   Refill history confirmed via [place] Controlled Substance database, accessed by me today, and 3 months of oxycodone 15 mg  Tid  refills given. #90/month . 

## 2018-06-16 NOTE — Assessment & Plan Note (Signed)
Remains  well-controlled on diet alone . Patient is up-to-date on eye exams and foot exam is normal today. Patient has no microalbuminuria. Patient is tolerating statin therapy for CAD risk reduction and on ACE/ARB for renal protection and hypertension      Continue asa and statin.  He has no proteinuria and has annual eye exams. He has deferred the final Pneumovax vaccine.   Lab Results  Component Value Date   HGBA1C 6.4 12/12/2017   Lab Results  Component Value Date   MICROALBUR <0.7 12/12/2017    

## 2018-06-21 ENCOUNTER — Telehealth: Payer: Self-pay

## 2018-06-21 DIAGNOSIS — J432 Centrilobular emphysema: Secondary | ICD-10-CM

## 2018-06-21 NOTE — Telephone Encounter (Signed)
Then he will need to let his pulmonologist know because the pulmonologist may decide that he needs to have a bronchoscopy

## 2018-06-21 NOTE — Telephone Encounter (Signed)
Pt was prescribed azithromycin and stated that once he was done with the medication his same symptoms came back.

## 2018-06-21 NOTE — Telephone Encounter (Signed)
Copied from CRM [id number]. Topic: General - Other >> Jun 21, 2018  2:27 PM Percival SpanishKennedy, Cheryl W wrote:  Pt cal lto say the medicine he was given did help but once med was all gone  his condition returned  did not remember the med he said

## 2018-06-24 DIAGNOSIS — J449 Chronic obstructive pulmonary disease, unspecified: Secondary | ICD-10-CM | POA: Diagnosis not present

## 2018-06-25 NOTE — Telephone Encounter (Signed)
Pt was notified that that referral has been placed for a different pulmonologist.

## 2018-06-25 NOTE — Telephone Encounter (Signed)
Rerral to Dr Welton FlakesKhan in process

## 2018-06-25 NOTE — Addendum Note (Signed)
Addended by: Sherlene ShamsULLO, Sheelah Ritacco L on: 06/25/2018 10:21 AM   Modules accepted: Orders

## 2018-06-25 NOTE — Telephone Encounter (Signed)
Spoke with pt and informed him that he would need to call his pulmonologist to let them know that he completed his round of medication and as soon as he finished the meds the symptoms returned because he may need to have a bronchoscopy. The pt stated that he has doubled up on his breathing medications and he is doing fine now. He also stated that he reached out to his pulmonologist and they didn't want to see him. Asked the pt if he would like a referral to a different pulmonologist since him seemed unhappy with were he is going and he stated no.

## 2018-07-06 DIAGNOSIS — J449 Chronic obstructive pulmonary disease, unspecified: Secondary | ICD-10-CM | POA: Diagnosis not present

## 2018-07-18 ENCOUNTER — Telehealth: Payer: Self-pay

## 2018-07-18 NOTE — Telephone Encounter (Signed)
Copied from CRM [id number]. Topic: General - Other >> Jul 18, 2018  2:24 PM Leafy Roobinson, Norma J wrote: Reason for CRM: pt is calling and would like to have neck xray. Pt will be going to see dr Park Breedkahn pulmonologist on 07-23-18

## 2018-07-23 ENCOUNTER — Encounter: Payer: Self-pay | Admitting: Internal Medicine

## 2018-07-23 ENCOUNTER — Ambulatory Visit: Payer: Medicare HMO | Admitting: Internal Medicine

## 2018-07-23 VITALS — BP 140/78 | HR 79 | Resp 16 | Ht 72.0 in | Wt 295.0 lb

## 2018-07-23 DIAGNOSIS — J441 Chronic obstructive pulmonary disease with (acute) exacerbation: Secondary | ICD-10-CM

## 2018-07-23 DIAGNOSIS — Z6841 Body Mass Index (BMI) 40.0 and over, adult: Secondary | ICD-10-CM | POA: Diagnosis not present

## 2018-07-23 DIAGNOSIS — G4733 Obstructive sleep apnea (adult) (pediatric): Secondary | ICD-10-CM | POA: Diagnosis not present

## 2018-07-23 DIAGNOSIS — E668 Other obesity: Secondary | ICD-10-CM

## 2018-07-23 NOTE — Patient Instructions (Signed)

## 2018-07-23 NOTE — Telephone Encounter (Signed)
Pt stated that the pulmonologist told him that there may be an obstruction in neck causing him to have a dry cough. They requested that the pt have a neck xray, but pt stated that he does not have the cough everyday and hasn't had the cough for the last 4 or five days. Pt stated that he does not want to have the xray right now because he is going to see Dr. Welton FlakesKhan today at 3:30 and wants to get his opinion first.
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
## 2018-07-24 ENCOUNTER — Encounter: Payer: Medicare HMO | Attending: Internal Medicine | Admitting: Internal Medicine

## 2018-07-24 ENCOUNTER — Ambulatory Visit
Admission: RE | Admit: 2018-07-24 | Discharge: 2018-07-24 | Disposition: A | Discharge status: Home / Self Care | Payer: Medicare HMO | Source: Ambulatory Visit | Attending: Internal Medicine | Admitting: Internal Medicine

## 2018-07-24 ENCOUNTER — Other Ambulatory Visit (HOSPITAL_BASED_OUTPATIENT_CLINIC_OR_DEPARTMENT_OTHER): Payer: Self-pay | Admitting: Internal Medicine

## 2018-07-24 DIAGNOSIS — I499 Cardiac arrhythmia, unspecified: Secondary | ICD-10-CM | POA: Diagnosis not present

## 2018-07-24 DIAGNOSIS — I87312 Chronic venous hypertension (idiopathic) with ulcer of left lower extremity: Secondary | ICD-10-CM | POA: Diagnosis not present

## 2018-07-24 DIAGNOSIS — O2230 Deep phlebothrombosis in pregnancy, unspecified trimester: Principal | ICD-10-CM

## 2018-07-24 DIAGNOSIS — I872 Venous insufficiency (chronic) (peripheral): Secondary | ICD-10-CM | POA: Diagnosis not present

## 2018-07-24 DIAGNOSIS — I10 Essential (primary) hypertension: Secondary | ICD-10-CM | POA: Insufficient documentation

## 2018-07-24 DIAGNOSIS — L97221 Non-pressure chronic ulcer of left calf limited to breakdown of skin: Secondary | ICD-10-CM | POA: Insufficient documentation

## 2018-07-24 DIAGNOSIS — M79605 Pain in left leg: Secondary | ICD-10-CM | POA: Diagnosis not present

## 2018-07-24 DIAGNOSIS — Z8673 Personal history of transient ischemic attack (TIA), and cerebral infarction without residual deficits: Secondary | ICD-10-CM | POA: Insufficient documentation

## 2018-07-24 DIAGNOSIS — M7989 Other specified soft tissue disorders: Principal | ICD-10-CM

## 2018-07-24 DIAGNOSIS — E11622 Type 2 diabetes mellitus with other skin ulcer: Secondary | ICD-10-CM | POA: Diagnosis not present

## 2018-07-24 DIAGNOSIS — I2510 Atherosclerotic heart disease of native coronary artery without angina pectoris: Secondary | ICD-10-CM | POA: Diagnosis not present

## 2018-07-24 DIAGNOSIS — D649 Anemia, unspecified: Secondary | ICD-10-CM | POA: Insufficient documentation

## 2018-07-24 DIAGNOSIS — M79662 Pain in left lower leg: Secondary | ICD-10-CM

## 2018-07-24 DIAGNOSIS — M1990 Unspecified osteoarthritis, unspecified site: Secondary | ICD-10-CM | POA: Diagnosis not present

## 2018-07-24 DIAGNOSIS — I87332 Chronic venous hypertension (idiopathic) with ulcer and inflammation of left lower extremity: Secondary | ICD-10-CM | POA: Diagnosis not present

## 2018-07-24 DIAGNOSIS — I890 Lymphedema, not elsewhere classified: Secondary | ICD-10-CM | POA: Insufficient documentation

## 2018-07-24 DIAGNOSIS — Z9981 Dependence on supplemental oxygen: Secondary | ICD-10-CM | POA: Diagnosis not present

## 2018-07-24 DIAGNOSIS — Z8249 Family history of ischemic heart disease and other diseases of the circulatory system: Secondary | ICD-10-CM | POA: Diagnosis not present

## 2018-07-24 DIAGNOSIS — I82409 Acute embolism and thrombosis of unspecified deep veins of unspecified lower extremity: Secondary | ICD-10-CM

## 2018-07-24 DIAGNOSIS — J449 Chronic obstructive pulmonary disease, unspecified: Secondary | ICD-10-CM | POA: Diagnosis not present

## 2018-07-24 DIAGNOSIS — Z87891 Personal history of nicotine dependence: Secondary | ICD-10-CM | POA: Insufficient documentation

## 2018-07-24 DIAGNOSIS — L97822 Non-pressure chronic ulcer of other part of left lower leg with fat layer exposed: Secondary | ICD-10-CM | POA: Diagnosis not present

## 2018-07-24 DIAGNOSIS — L97222 Non-pressure chronic ulcer of left calf with fat layer exposed: Secondary | ICD-10-CM | POA: Diagnosis not present

## 2018-07-26 ENCOUNTER — Encounter: Payer: Self-pay | Admitting: Internal Medicine

## 2018-07-27 ENCOUNTER — Other Ambulatory Visit: Payer: Self-pay

## 2018-07-27 ENCOUNTER — Emergency Department: Payer: Medicare HMO

## 2018-07-27 ENCOUNTER — Encounter: Payer: Self-pay | Admitting: Emergency Medicine

## 2018-07-27 ENCOUNTER — Emergency Department
Admission: EM | Admit: 2018-07-27 | Discharge: 2018-07-27 | Disposition: A | Discharge status: Home / Self Care | Payer: Medicare HMO | Attending: Emergency Medicine | Admitting: Emergency Medicine

## 2018-07-27 DIAGNOSIS — E119 Type 2 diabetes mellitus without complications: Secondary | ICD-10-CM | POA: Insufficient documentation

## 2018-07-27 DIAGNOSIS — I2510 Atherosclerotic heart disease of native coronary artery without angina pectoris: Secondary | ICD-10-CM | POA: Insufficient documentation

## 2018-07-27 DIAGNOSIS — Z96649 Presence of unspecified artificial hip joint: Secondary | ICD-10-CM | POA: Diagnosis not present

## 2018-07-27 DIAGNOSIS — N492 Inflammatory disorders of scrotum: Secondary | ICD-10-CM | POA: Diagnosis not present

## 2018-07-27 DIAGNOSIS — S3130XA Unspecified open wound of scrotum and testes, initial encounter: Secondary | ICD-10-CM | POA: Diagnosis not present

## 2018-07-27 DIAGNOSIS — L03314 Cellulitis of groin: Secondary | ICD-10-CM | POA: Diagnosis not present

## 2018-07-27 DIAGNOSIS — Z87891 Personal history of nicotine dependence: Secondary | ICD-10-CM | POA: Insufficient documentation

## 2018-07-27 DIAGNOSIS — Z79899 Other long term (current) drug therapy: Secondary | ICD-10-CM | POA: Insufficient documentation

## 2018-07-27 DIAGNOSIS — I509 Heart failure, unspecified: Secondary | ICD-10-CM | POA: Insufficient documentation

## 2018-07-27 DIAGNOSIS — N5089 Other specified disorders of the male genital organs: Secondary | ICD-10-CM | POA: Diagnosis present

## 2018-07-27 DIAGNOSIS — Z7982 Long term (current) use of aspirin: Secondary | ICD-10-CM | POA: Diagnosis not present

## 2018-07-27 DIAGNOSIS — Z8673 Personal history of transient ischemic attack (TIA), and cerebral infarction without residual deficits: Secondary | ICD-10-CM | POA: Insufficient documentation

## 2018-07-27 DIAGNOSIS — L0390 Cellulitis, unspecified: Secondary | ICD-10-CM

## 2018-07-27 DIAGNOSIS — R1032 Left lower quadrant pain: Secondary | ICD-10-CM

## 2018-07-27 DIAGNOSIS — J449 Chronic obstructive pulmonary disease, unspecified: Secondary | ICD-10-CM | POA: Insufficient documentation

## 2018-07-27 DIAGNOSIS — I110 Hypertensive heart disease with heart failure: Secondary | ICD-10-CM | POA: Diagnosis not present

## 2018-07-27 DIAGNOSIS — N433 Hydrocele, unspecified: Secondary | ICD-10-CM | POA: Diagnosis not present

## 2018-07-27 LAB — CBC WITH DIFFERENTIAL/PLATELET
Basophils Absolute: 0 10*3/uL (ref 0–0.1)
Basophils Relative: 1 %
EOS ABS: 0.3 10*3/uL (ref 0–0.7)
Eosinophils Relative: 3 %
HCT: 37.2 % — ABNORMAL LOW (ref 40.0–52.0)
HEMOGLOBIN: 12.7 g/dL — AB (ref 13.0–18.0)
LYMPHS ABS: 1.9 10*3/uL (ref 1.0–3.6)
LYMPHS PCT: 18 %
MCH: 31 pg (ref 26.0–34.0)
MCHC: 34.2 g/dL (ref 32.0–36.0)
MCV: 90.6 fL (ref 80.0–100.0)
Monocytes Absolute: 0.9 10*3/uL (ref 0.2–1.0)
Monocytes Relative: 9 %
NEUTROS PCT: 69 %
Neutro Abs: 7.3 10*3/uL — ABNORMAL HIGH (ref 1.4–6.5)
Platelets: 194 10*3/uL (ref 150–440)
RBC: 4.11 MIL/uL — AB (ref 4.40–5.90)
RDW: 14 % (ref 11.5–14.5)
WBC: 10.5 10*3/uL (ref 3.8–10.6)

## 2018-07-27 LAB — BASIC METABOLIC PANEL
Anion gap: 9 (ref 5–15)
BUN: 17 mg/dL (ref 8–23)
CHLORIDE: 97 mmol/L — AB (ref 98–111)
CO2: 35 mmol/L — ABNORMAL HIGH (ref 22–32)
CREATININE: 1.18 mg/dL (ref 0.61–1.24)
Calcium: 9.4 mg/dL (ref 8.9–10.3)
GFR calc Af Amer: >60 mL/min (ref >60)
GFR calc non Af Amer: >60 mL/min (ref >60)
Glucose, Bld: 101 mg/dL — ABNORMAL HIGH (ref 70–99)
POTASSIUM: 3.6 mmol/L (ref 3.5–5.1)
SODIUM: 141 mmol/L (ref 135–145)

## 2018-07-27 MED ORDER — DOXYCYCLINE HYCLATE 100 MG PO CAPS: 100.0000 mg | ORAL_CAPSULE | Freq: Two times a day (BID) | ORAL | 0 refills | Status: DC

## 2018-07-27 MED ORDER — DOXYCYCLINE HYCLATE 100 MG PO TABS
100.0000 mg | ORAL_TABLET | Freq: Once | ORAL | Status: AC
  Administered 2018-07-27: 100 mg via ORAL
  Filled 2018-07-27: qty 1

## 2018-07-27 NOTE — ED Notes (Signed)
Pt talking on phone in no acute distress. Pt denies needs.
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
## 2018-07-27 NOTE — ED Notes (Signed)
Pt to ultrasound

## 2018-07-27 NOTE — ED Triage Notes (Signed)
Pt with red swollen area noted to left groin. Pt with area the size of a golf ball noted to left groin. Pt denies known fever.

## 2018-07-27 NOTE — Discharge Instructions (Signed)
Please seek medical attention for any high fevers, chest pain, shortness of breath, change in behavior, persistent vomiting, bloody stool or any other new or concerning symptoms.  

## 2018-07-29 ENCOUNTER — Ambulatory Visit (INDEPENDENT_AMBULATORY_CARE_PROVIDER_SITE_OTHER): Payer: Medicare HMO | Admitting: Internal Medicine

## 2018-07-29 ENCOUNTER — Encounter: Payer: Self-pay | Admitting: Internal Medicine

## 2018-07-29 DIAGNOSIS — N492 Inflammatory disorders of scrotum: Secondary | ICD-10-CM

## 2018-07-29 MED ORDER — CIPROFLOXACIN HCL 500 MG PO TABS: 500.0000 mg | ORAL_TABLET | Freq: Two times a day (BID) | ORAL | 0 refills | Status: DC

## 2018-07-29 NOTE — Assessment & Plan Note (Addendum)
ER record and imaging studies reviewed.  He appears to have developed an abscess since Saturday based on the copious amounts of drainage,  And the scrotal swelling has not improved and is now resulting in contamination of the infected area potentially with urine.  Broadening antibiotic coverage from doxycycline to doxy plus ciprofloxacin to cover GNRs.  Probiotic advised. Patient advised to contact urologist if draining stops and pain increass. One week follow up

## 2018-07-29 NOTE — Progress Notes (Signed)
Subjective:  Patient ID: Victor Daniels, male    DOB: 08-07-1946  Age: 72 y.o. MRN: [id number]  CC: The encounter diagnosis was Abscess of scrotal wall.  HPI Victor Daniels presents for ER follow up on scrotal abscess with cellulitis . Patient was treated in ER on Saturday when he developed increasing pain and erythema accompanied by malodorous drainage from a previously noted cyst on the superior surface of the left side of his scrotum.  Pelvic ultrasound was done and no fluid collection  Was noted (other than bilateral hydroceles) .  He was prescribed doxycycline. He has been applying warm compresses had a significant amount of purulent  drainage but continues to have a swollen scrotum which has made it difficult to urinate without contaminating his dressing. He denies fevers..  He has been suspending his opioids in order to monitor his pain which has not escalated.   Outpatient Medications Prior to Visit  Medication Sig Dispense Refill  . amLODipine (NORVASC) 5 MG tablet TAKE 1 TABLET EVERY DAY 90 tablet 3  . arformoterol (BROVANA) 15 MCG/2ML NEBU Take 2 mLs (15 mcg total) by nebulization 2 (two) times daily. DX: COPD  DX Code: J44.9 120 mL 5  . aspirin 81 MG tablet Take 81 mg by mouth daily.    . budesonide (PULMICORT) 0.25 MG/2ML nebulizer solution Take 2 mLs (0.25 mg total) by nebulization 2 (two) times daily. DX: COPD DX Code: J44.9 120 mL 5  . carvedilol (COREG) 12.5 MG tablet TAKE 1 TABLET TWICE DAILY WITH MEALS 180 tablet 1  . docusate sodium (COLACE) 100 MG capsule Take 2 capsules (200 mg total) by mouth daily. 60 capsule 2  . doxycycline (VIBRAMYCIN) 100 MG capsule Take 1 capsule (100 mg total) by mouth 2 (two) times daily for 10 days. 20 capsule 0  . furosemide (LASIX) 20 MG tablet Take 1 tablet (20 mg total) by mouth daily as needed. (Patient taking differently: Take 20 mg by mouth every other day. ) 90 tablet 3  . ipratropium (ATROVENT) 0.03 % nasal spray PLACE 2  SPRAYS INTO BOTH NOSTRILS EVERY 12 (TWELVE) HOURS. 60 mL 12  . ipratropium-albuterol (DUONEB) 0.5-2.5 (3) MG/3ML SOLN Take 3 mLs by nebulization every 6 (six) hours as needed. 360 mL 1  . Lactulose 20 GM/30ML SOLN 30 ml every 4 hours until constipation is relieved 236 mL 3  . losartan-hydrochlorothiazide (HYZAAR) 100-25 MG tablet TAKE 1 TABLET EVERY DAY 90 tablet 3  . methocarbamol (ROBAXIN) 500 MG tablet Take 1 tablet (500 mg total) by mouth every 8 (eight) hours as needed for muscle spasms. 90 tablet 1  . Multiple Vitamin (MULTIVITAMIN) capsule Take 1 capsule by mouth daily.    . nitroGLYCERIN (NITROSTAT) 0.4 MG SL tablet Place 1 tablet (0.4 mg total) under the tongue every 5 (five) minutes as needed for chest pain. Maximum dose 3 tablets 50 tablet 3  . oxyCODONE (ROXICODONE) 15 MG immediate release tablet Take 1 tablet (15 mg total) by mouth every 8 (eight) hours as needed for pain. 90 tablet 0  . oxyCODONE (ROXICODONE) 15 MG immediate release tablet Take 1 tablet (15 mg total) by mouth every 8 (eight) hours as needed for pain. 90 tablet 0  . oxyCODONE (ROXICODONE) 15 MG immediate release tablet Take 1 tablet (15 mg total) by mouth every 8 (eight) hours as needed for pain. 90 tablet 0  . oxyCODONE (ROXICODONE) 15 MG immediate release tablet Take 1 tablet (15 mg total) by mouth every 8 (  eight) hours as needed for pain. 90 tablet 0  . OXYGEN Inhale 2 L into the lungs.    . simvastatin (ZOCOR) 20 MG tablet Take 1 tablet (20 mg total) by mouth at bedtime. 90 tablet 2  . azithromycin (ZITHROMAX) 500 MG tablet One tablet daily (Patient not taking: Reported on 07/29/2018) 5 tablet 0  . HYDROcodone-homatropine (HYCODAN) 5-1.5 MG/5ML syrup Take 5 mLs by mouth at bedtime as needed for cough. (Patient not taking: Reported on 07/29/2018) 120 mL 0  . omeprazole (PRILOSEC) 40 MG capsule Take 1 capsule (40 mg total) by mouth daily. (Patient not taking: Reported on 07/29/2018) 30 capsule 5  . predniSONE (DELTASONE) 20  MG tablet Take 2 tablets (40 mg total) by mouth daily with breakfast. 10 days (Patient not taking: Reported on 07/29/2018) 20 tablet 0  . tiZANidine (ZANAFLEX) 4 MG tablet Take 1 tablet (4 mg total) by mouth every 6 (six) hours as needed for muscle spasms. (Patient not taking: Reported on 07/29/2018) 360 tablet 0   No facility-administered medications prior to visit.     Review of Systems;  Patient denies headache, fevers, malaise, unintentional weight loss, skin rash, eye pain, sinus congestion and sinus pain, sore throat, dysphagia,  hemoptysis , cough, dyspnea, wheezing, chest pain, palpitations, orthopnea, edema, abdominal pain, nausea, melena, diarrhea, constipation, flank pain, dysuria, hematuria, urinary  Frequency, nocturia, numbness, tingling, seizures,  Focal weakness, Loss of consciousness,  Tremor, insomnia, depression, anxiety, and suicidal ideation.      Objective:  BP 118/70 (BP Location: Left Arm, Patient Position: Sitting, Cuff Size: Large)   Pulse 86   Temp 98.3 F (36.8 C) (Oral)   Resp 17   Ht 6' (1.829 m)   Wt 289 lb 9.6 oz (131.4 kg)   SpO2 90%   BMI 39.28 kg/m   BP Readings from Last 3 Encounters:  07/29/18 118/70  07/27/18 136/77  07/23/18 140/78    Wt Readings from Last 3 Encounters:  07/29/18 289 lb 9.6 oz (131.4 kg)  07/27/18 300 lb (136.1 kg)  07/23/18 295 lb (133.8 kg)    General appearance: alert, cooperative and appears stated age Ears: normal TM's and external ear canals both ears Throat: lips, mucosa, and tongue normal; teeth and gums normal Neck: no adenopathy, no carotid bruit, supple, symmetrical, trachea midline and thyroid not enlarged, symmetric, no tenderness/mass/nodules Back: symmetric, no curvature. ROM normal. No CVA tenderness. Lungs: clear to auscultation bilaterally Heart: regular rate and rhythm, S1, S2 normal, no murmur, click, rub or gallop Abdomen: soft, non-tender; bowel sounds normal; no masses,  no organomegaly Urogenital:  scrotum erythematous and edematous , with spontaneous malodorous drainage Pulses: 2+ and symmetric Skin: Skin color, texture, turgor normal. No rashes or lesions Lymph nodes: Cervical, supraclavicular, and axillary nodes normal.  Lab Results  Component Value Date   HGBA1C 6.4 12/12/2017   HGBA1C 6.4 09/12/2017   HGBA1C 6.4 06/12/2017    Lab Results  Component Value Date   CREATININE 1.18 07/27/2018   CREATININE 1.24 12/12/2017   CREATININE 1.06 09/12/2017    Lab Results  Component Value Date   WBC 10.5 07/27/2018   HGB 12.7 (L) 07/27/2018   HCT 37.2 (L) 07/27/2018   PLT 194 07/27/2018   GLUCOSE 101 (H) 07/27/2018   CHOL 151 06/12/2017   TRIG 77.0 06/12/2017   HDL 53.30 06/12/2017   LDLDIRECT 72.0 12/01/2016   LDLCALC 82 06/12/2017   ALT 20 12/12/2017   AST 18 12/12/2017   NA 141 07/27/2018  K 3.6 07/27/2018   CL 97 (L) 07/27/2018   CREATININE 1.18 07/27/2018   BUN 17 07/27/2018   CO2 35 (H) 07/27/2018   TSH 0.62 06/12/2017   INR 0.9 04/02/2015   HGBA1C 6.4 12/12/2017   MICROALBUR <0.7 12/12/2017    [place] Scrotum  Result Date: 07/27/2018 CLINICAL DATA:  LEFT groin pain, LEFT scrotal pain and swelling for many years now worsened in past week EXAM: SCROTAL ULTRASOUND DOPPLER ULTRASOUND OF THE TESTICLES TECHNIQUE: Complete ultrasound examination of the testicles, epididymis, and other scrotal structures was performed. Color and spectral Doppler ultrasound were also utilized to evaluate blood flow to the testicles. COMPARISON:  None FINDINGS: Right testicle Measurements: 3.8 x 2.9 x 3.1 cm. Normal morphology without mass or calcification. Internal blood flow present on color Doppler imaging. Appendix testis noted. Left testicle Measurements: 3.7 x 3.0 x 2.9 cm. Normal echogenicity. Few striations along the radial architecture within the LEFT testis likely representing minimal interstitial fibrosis, not clinically significant. No mass or calcifications. Internal blood flow  present on color Doppler imaging. Appendix testis noted. Right epididymis:  Normal in size and appearance. Left epididymis:  Normal in size and appearance. Hydrocele: BILATERAL hydroceles, both containing a few scattered internal echoes Varicocele:  Absent bilaterally Pulsed Doppler interrogation of both testes demonstrates normal low resistance arterial and venous waveforms bilaterally. Sonography of the area of clinical concern, redness and swelling anterolateral in the LEFT scrotum demonstrates scrotal wall thickening and hypervascularity without mass or abnormal fluid collection. IMPRESSION: BILATERAL hydroceles. Scrotal wall thickening and hypervascularity at site of clinical concern at the LEFT hemiscrotum without evidence of discrete abscess collection, could represent cellulitis. Unremarkable sonographic appearances of the testes and epididymi bilaterally. Electronically Signed   By: Ulyses SouthwardMark  Boles M.D.   On: 07/27/2018 20:46   [place] Pelvic Doppler (torsion R/o Or Mass Arterial Flow)  Result Date: 07/27/2018 CLINICAL DATA:  LEFT groin pain, LEFT scrotal pain and swelling for many years now worsened in past week EXAM: SCROTAL ULTRASOUND DOPPLER ULTRASOUND OF THE TESTICLES TECHNIQUE: Complete ultrasound examination of the testicles, epididymis, and other scrotal structures was performed. Color and spectral Doppler ultrasound were also utilized to evaluate blood flow to the testicles. COMPARISON:  None FINDINGS: Right testicle Measurements: 3.8 x 2.9 x 3.1 cm. Normal morphology without mass or calcification. Internal blood flow present on color Doppler imaging. Appendix testis noted. Left testicle Measurements: 3.7 x 3.0 x 2.9 cm. Normal echogenicity. Few striations along the radial architecture within the LEFT testis likely representing minimal interstitial fibrosis, not clinically significant. No mass or calcifications. Internal blood flow present on color Doppler imaging. Appendix testis noted. Right  epididymis:  Normal in size and appearance. Left epididymis:  Normal in size and appearance. Hydrocele: BILATERAL hydroceles, both containing a few scattered internal echoes Varicocele:  Absent bilaterally Pulsed Doppler interrogation of both testes demonstrates normal low resistance arterial and venous waveforms bilaterally. Sonography of the area of clinical concern, redness and swelling anterolateral in the LEFT scrotum demonstrates scrotal wall thickening and hypervascularity without mass or abnormal fluid collection. IMPRESSION: BILATERAL hydroceles. Scrotal wall thickening and hypervascularity at site of clinical concern at the LEFT hemiscrotum without evidence of discrete abscess collection, could represent cellulitis. Unremarkable sonographic appearances of the testes and epididymi bilaterally. Electronically Signed   By: Ulyses SouthwardMark  Boles M.D.   On: 07/27/2018 20:46    Assessment & Plan:   Problem List Items Addressed This Visit    Abscess of scrotal wall    ER record and  imaging studies reviewed.  He appears to have developed an abscess since Saturday based on the copious amounts of drainage,  And the scrotal swelling has not improved and is now resulting in contamination of the infected area potentially with urine.  Broadening antibiotic coverage from doxycycline to doxy plus ciprofloxacin to cover GNRs.  Probiotic advised. Patient advised to contact urologist if draining stops and pain increass. One week follow up          I have discontinued Tawni Millers. Westall's omeprazole, tiZANidine, HYDROcodone-homatropine, predniSONE, and azithromycin. I am also having him start on ciprofloxacin. Additionally, I am having him maintain his aspirin, nitroGLYCERIN, multivitamin, budesonide, arformoterol, ipratropium, amLODipine, furosemide, docusate sodium, oxyCODONE, methocarbamol, Lactulose, carvedilol, simvastatin, losartan-hydrochlorothiazide, ipratropium-albuterol, oxyCODONE, oxyCODONE, oxyCODONE,  OXYGEN, and doxycycline.  Meds ordered this encounter  Medications  . ciprofloxacin (CIPRO) 500 MG tablet    Sig: Take 1 tablet (500 mg total) by mouth 2 (two) times daily.    Dispense:  14 tablet    Refill:  0    Medications Discontinued During This Encounter  Medication Reason  . azithromycin (ZITHROMAX) 500 MG tablet Completed Course  . HYDROcodone-homatropine (HYCODAN) 5-1.5 MG/5ML syrup Completed Course  . omeprazole (PRILOSEC) 40 MG capsule Patient has not taken in last 30 days  . predniSONE (DELTASONE) 20 MG tablet Completed Course  . tiZANidine (ZANAFLEX) 4 MG tablet Patient has not taken in last 30 days    Follow-up: Return in about 1 week (around 08/05/2018).   [name], MD

## 2018-07-29 NOTE — Patient Instructions (Signed)
You have an abscess on your scrotum.  Continue doxycycline  Twice daily with food  ,  I am adding  Ciprofloxacin for additional l coverage of different types of bacteria   If the boil stops draining and gets larger and more painful ,  You will need to see Dr Lonna CobbStoioff immediately  Taking an antibiotic can create an imbalance in the normal population of bacteria that live in the small intestine.  This imbalance can persist for 3 months.   Taking a probiotic ( Align, Floraque or Culturelle), the generic version of one of these over the counter medications, or an alternative form  ( Yogurt, or another dietary source) for a minimum of 3 weeks may help prevent a serious antibiotic associated diarrhea  Called clostridium dificile colitis that occurs when the bacteria population is altered .  Taking a probiotic may also prevent vaginitis due to yeast infections and can be continued indefinitely if you feel that it improves your digestion or your elimination (bowels).

## 2018-07-30 ENCOUNTER — Telehealth: Payer: Self-pay

## 2018-07-30 DIAGNOSIS — N492 Inflammatory disorders of scrotum: Secondary | ICD-10-CM

## 2018-07-30 NOTE — Telephone Encounter (Signed)
Referral in process, but if the  abscess has stopped draining and more swelling is noted,  He needs to go to ER today

## 2018-07-30 NOTE — Telephone Encounter (Signed)
Pt would like a referral to urologist for the abscess that he was seen in the office for yesterday.

## 2018-07-30 NOTE — Addendum Note (Signed)
Addended by: Sherlene ShamsULLO, Angee Gupton L on: 07/30/2018 01:13 PM   Modules accepted: Orders

## 2018-07-30 NOTE — Telephone Encounter (Signed)
Spoke with pt to let him know that the referral has been put in place and advised him that if he does not hear anything in the next week to please give our office a call back so we can check on the referral for him. Pt stated that the abscess is still draining and the swelling has gone down some as well the pain has gotten better.

## 2018-07-30 NOTE — Telephone Encounter (Signed)
Copied from CRM [id number]. Topic: Referral - Request >> Jul 30, 2018  9:58 AM Gaynelle AduPoole, Shalonda wrote: Reason for CRM:  Patient is requesting a  referral for  urologist, in regards to abscess that he was seen for on 07-29-18. Please advise

## 2018-07-31 ENCOUNTER — Encounter: Payer: Medicare HMO | Attending: Nurse Practitioner | Admitting: Nurse Practitioner

## 2018-07-31 DIAGNOSIS — I509 Heart failure, unspecified: Secondary | ICD-10-CM | POA: Diagnosis not present

## 2018-07-31 DIAGNOSIS — I2510 Atherosclerotic heart disease of native coronary artery without angina pectoris: Secondary | ICD-10-CM | POA: Insufficient documentation

## 2018-07-31 DIAGNOSIS — J449 Chronic obstructive pulmonary disease, unspecified: Secondary | ICD-10-CM | POA: Insufficient documentation

## 2018-07-31 DIAGNOSIS — I872 Venous insufficiency (chronic) (peripheral): Secondary | ICD-10-CM | POA: Diagnosis not present

## 2018-07-31 DIAGNOSIS — L97822 Non-pressure chronic ulcer of other part of left lower leg with fat layer exposed: Secondary | ICD-10-CM | POA: Diagnosis not present

## 2018-07-31 DIAGNOSIS — I890 Lymphedema, not elsewhere classified: Secondary | ICD-10-CM | POA: Insufficient documentation

## 2018-07-31 DIAGNOSIS — L97221 Non-pressure chronic ulcer of left calf limited to breakdown of skin: Secondary | ICD-10-CM | POA: Diagnosis not present

## 2018-07-31 DIAGNOSIS — I110 Hypertensive heart disease with heart failure: Secondary | ICD-10-CM | POA: Diagnosis not present

## 2018-07-31 DIAGNOSIS — I87332 Chronic venous hypertension (idiopathic) with ulcer and inflammation of left lower extremity: Secondary | ICD-10-CM | POA: Insufficient documentation

## 2018-07-31 DIAGNOSIS — E11622 Type 2 diabetes mellitus with other skin ulcer: Secondary | ICD-10-CM | POA: Diagnosis not present
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
## 2018-08-01 ENCOUNTER — Encounter: Payer: Self-pay | Admitting: Urology

## 2018-08-01 ENCOUNTER — Ambulatory Visit (INDEPENDENT_AMBULATORY_CARE_PROVIDER_SITE_OTHER): Payer: Medicare HMO | Admitting: Urology

## 2018-08-01 VITALS — BP 124/72 | HR 76 | Ht 72.0 in | Wt 290.0 lb

## 2018-08-01 DIAGNOSIS — N433 Hydrocele, unspecified: Secondary | ICD-10-CM | POA: Diagnosis not present

## 2018-08-01 DIAGNOSIS — N492 Inflammatory disorders of scrotum: Secondary | ICD-10-CM

## 2018-08-01 LAB — URINALYSIS, COMPLETE
Bilirubin, UA: NEGATIVE
GLUCOSE, UA: NEGATIVE
KETONES UA: NEGATIVE
LEUKOCYTES UA: NEGATIVE
NITRITE UA: NEGATIVE
RBC UA: NEGATIVE
SPEC GRAV UA: 1.025 (ref 1.005–1.030)
Urobilinogen, Ur: 0.2 mg/dL (ref 0.2–1.0)
pH, UA: 7 (ref 5.0–7.5)

## 2018-08-01 LAB — MICROSCOPIC EXAMINATION
EPITHELIAL CELLS (NON RENAL): NONE SEEN /HPF (ref 0–10)
RBC MICROSCOPIC, UA: NONE SEEN /HPF (ref 0–2)
WBC, UA: NONE SEEN /hpf (ref 0–5)

## 2018-08-01 LAB — BLADDER SCAN AMB NON-IMAGING

## 2018-08-02 ENCOUNTER — Encounter: Payer: Self-pay | Admitting: Urology

## 2018-08-02 ENCOUNTER — Ambulatory Visit (INDEPENDENT_AMBULATORY_CARE_PROVIDER_SITE_OTHER): Payer: Medicare HMO | Admitting: Urology

## 2018-08-02 VITALS — BP 114/66 | HR 79 | Ht 72.0 in | Wt 290.0 lb

## 2018-08-02 DIAGNOSIS — N492 Inflammatory disorders of scrotum: Secondary | ICD-10-CM | POA: Diagnosis not present

## 2018-08-03 LAB — URINE CULTURE: ORGANISM ID, BACTERIA: NO GROWTH

## 2018-08-04 NOTE — Progress Notes (Signed)
Victor Daniels ([id number]) Visit Report for 07/24/2018 Allergy List Details Patient Name: Victor Daniels, Victor Daniels. Date of Service: 07/24/2018 9:45 AM Medical Record Number: [id number] Patient Account Number: [id number] Date of Birth/Sex: December 07, 1946 (72 y.o. M) Treating RN: Victor Daniels Primary Care Victor Daniels: Victor Daniels Other Clinician: Referring Victor Daniels: Victor Daniels Treating Victor Daniels/Extender: Victor Daniels Weeks in Treatment: 0 Allergies Active Allergies No Known Drug Allergies Allergy Notes Electronic Signature(s) Signed: 07/24/2018 5:09:20 PM By: Victor Daniels Entered By: Victor Daniels on 07/24/2018 10:21:47 Victor Daniels ([id number]) -------------------------------------------------------------------------------- Arrival Information Details Patient Name: Victor Daniels, Victor Daniels. Date of Service: 07/24/2018 9:45 AM Medical Record Number: [id number] Patient Account Number: [id number] Date of Birth/Sex: 20-Jul-1946 (72 y.o. M) Treating RN: Victor Daniels Primary Care Victor Daniels: Victor Daniels Other Clinician: Referring Victor Daniels: Victor Daniels Treating Victor Daniels/Extender: Victor Daniels in Treatment: 0 Visit Information Patient Arrived: Ambulatory Arrival Time: 10:18 Accompanied By: self Transfer Assistance: None Patient Identification Verified: Yes Secondary Verification Process Yes Completed: Patient Has Alerts: Yes Patient Alerts: Victor Daniels bilateral 07/24/2018 History Since Last Visit Electronic Signature(s) Signed: 07/24/2018 5:09:20 PM By: Victor Daniels Entered By: Victor Daniels on 07/24/2018 10:59:22 Victor Daniels ([id number]) -------------------------------------------------------------------------------- Clinic Level of Care Assessment Details Patient Name: Victor Daniels. Date of Service: 07/24/2018 9:45 AM Medical Record Number: [id number] Patient Account Number: [id number] Date of Birth/Sex: 05-15-1946  (72 y.o. M) Treating RN: Victor Daniels Primary Care Victor Daniels: Victor Daniels Other Clinician: Referring Jermar Colter: Victor Daniels Treating Victor Daniels/Extender: Victor Daniels in Treatment: 0 Clinic Level of Care Assessment Items TOOL 1 Quantity Score []  - Use when EandM and Procedure is performed on INITIAL visit 0 ASSESSMENTS - Nursing Assessment / Reassessment X - General Physical Exam (combine w/ comprehensive assessment (listed just below) when 1 20 performed on new pt. evals) X- 1 25 Comprehensive Assessment (HX, ROS, Risk Assessments, Wounds Hx, etc.) ASSESSMENTS - Wound and Skin Assessment / Reassessment []  - Dermatologic / Skin Assessment (not related to wound area) 0 ASSESSMENTS - Ostomy and/or Continence Assessment and Care []  - Incontinence Assessment and Management 0 []  - 0 Ostomy Care Assessment and Management (repouching, etc.) PROCESS - Coordination of Care X - Simple Patient / Family Education for ongoing care 1 15 []  - 0 Complex (extensive) Patient / Family Education for ongoing care X- 1 10 Staff obtains Chiropractor, Records, Test Results / Process Orders []  - 0 Staff telephones HHA, Nursing Homes / Clarify orders / etc []  - 0 Routine Transfer to another Facility (non-emergent condition) []  - 0 Routine Hospital Admission (non-emergent condition) X- 1 15 New Admissions / Manufacturing engineer / Ordering NPWT, Apligraf, etc. []  - 0 Emergency Hospital Admission (emergent condition) PROCESS - Special Needs []  - Pediatric / Minor Patient Management 0 []  - 0 Isolation Patient Management []  - 0 Hearing / Language / Visual special needs []  - 0 Assessment of Community assistance (transportation, D/C planning, etc.) []  - 0 Additional assistance / Altered mentation []  - 0 Support Surface(s) Assessment (bed, cushion, seat, etc.) Victor Daniels, [name]. ([id number]) INTERVENTIONS - Miscellaneous []  - External ear exam 0 []  - 0 Patient Transfer (multiple staff  / Nurse, adult / Similar devices) []  - 0 Simple Staple / Suture removal (25 or less) []  - 0 Complex Staple / Suture removal (26 or more) []  - 0 Hypo/Hyperglycemic Management (do not check if billed separately) X- 1 15 Ankle / Brachial Index (ABI) - do not check if billed separately Has the patient been seen at the hospital within the last  three years: Yes Total Score: 100 Level Of Care: New/Established - Level 3 Electronic Signature(s) Signed: 07/26/2018 6:17:48 PM By: Victor Daniels, BSN, RN, CWS, Kim RN, BSN Entered By: Victor Daniels, BSN, RN, CWS, Kim on 07/26/2018 08:06:04 Victor Daniels ([id number]) -------------------------------------------------------------------------------- Encounter Discharge Information Details Patient Name: Victor Daniels. Date of Service: 07/24/2018 9:45 AM Medical Record Number: [id number] Patient Account Number: [id number] Date of Birth/Sex: December 27, 1945 (72 y.o. M) Treating RN: Victor Daniels Primary Care Zaahir Pickney: Victor Daniels Other Clinician: Referring Sharlena Kristensen: Victor Daniels Treating Marae Cottrell/Extender: Victor Daniels in Treatment: 0 Encounter Discharge Information Items Discharge Condition: Stable Ambulatory Status: Ambulatory Discharge Destination: Home Transportation: Private Auto Schedule Follow-up Appointment: Yes Clinical Summary of Care: Electronic Signature(s) Signed: 07/26/2018 6:17:48 PM By: Victor Daniels, BSN, RN, CWS, Kim RN, BSN Entered By: Victor Daniels, BSN, RN, CWS, Kim on 07/24/2018 12:01:02 Victor Daniels ([id number]) -------------------------------------------------------------------------------- Lower Extremity Assessment Details Patient Name: Victor Daniels. Date of Service: 07/24/2018 9:45 AM Medical Record Number: [id number] Patient Account Number: [id number] Date of Birth/Sex: [birth year] (72 y.o. M) Treating RN: Victor Daniels Primary Care Zeplin Aleshire: Victor Daniels Other Clinician: Referring Madora Barletta: Victor Daniels Treating Victor Daniels/Extender: Victor Daniels in Treatment: 0 Edema Assessment Assessed: [Left: No] [Right: No] Edema: [Left: Yes] [Right: Yes] Calf Left: Right: Point of Measurement: 33 cm From Medial Instep 47 cm 44.5 cm Ankle Left: Right: Point of Measurement: 10 cm From Medial Instep 29 cm 28.5 cm Vascular Assessment Claudication: Claudication Assessment [Left:None] [Right:None] Pulses: Dorsalis Pedis Palpable: [Left:Yes] [Right:Yes] Doppler Audible: [Left:Yes] [Right:Yes] Posterior Tibial Extremity colors, hair growth, and conditions: Extremity Color: [Left:Hyperpigmented] [Right:Hyperpigmented] Hair Growth on Extremity: [Left:No] [Right:No] Temperature of Extremity: [Left:Warm] [Right:Warm] Capillary Refill: [Left:> 3 seconds] [Right:> 3 seconds] Toe Nail Assessment Left: Right: Thick: No No Discolored: No No Deformed: No No Improper Length and Hygiene: No Notes Isola bilateral boundy pulses >220 Electronic Signature(s) Signed: 07/24/2018 5:09:20 PM By: Victor Daniels Entered By: Victor Daniels on 07/24/2018 10:58:54 Victor Daniels ([id number]) -------------------------------------------------------------------------------- Multi Wound Chart Details Patient Name: Victor Daniels. Date of Service: 07/24/2018 9:45 AM Medical Record Number: [id number] Patient Account Number: [id number] Date of Birth/Sex: [birth year] (72 y.o. M) Treating RN: Victor Daniels Primary Care Rasheena Talmadge: Victor Daniels Other Clinician: Referring Lyzette Reinhardt: Victor Daniels Treating Demetrio Leighty/Extender: Victor  in Treatment: 0 Vital Signs Height(in): 72 Pulse(bpm): 75 Weight(lbs): 293.4 Blood Pressure(mmHg): 139/86 Body Mass Index(BMI): 40 Temperature(F): 98.3 Respiratory Rate 18 (breaths/min): Photos: [10:No Photos] [7:No Photos] [8:No Photos] Wound Location: [10:Left Lower Leg - Anterior, Distal] [7:Left Lower Leg - Medial, Anterior] [8:Left Lower  Leg - Medial, Posterior] Wounding Event: [10:Blister] [7:Blister] [8:Blister] Primary Etiology: [10:Lymphedema] [7:Lymphedema] [8:Lymphedema] Comorbid History: [10:Cataracts, Anemia, Asthma, Cataracts, Anemia, Asthma, Cataracts, Anemia, Asthma, Chronic Obstructive Pulmonary Disease (COPD), Pulmonary Disease (COPD), Pulmonary Disease (COPD), Arrhythmia, Coronary Artery Arrhythmia, Coronary  Artery Arrhythmia, Coronary Artery Disease, Hypertension, Type II Disease, Hypertension, Type II Disease, Hypertension, Type II Diabetes, Osteoarthritis] [7:Chronic Obstructive Diabetes, Osteoarthritis] [8:Chronic Obstructive Diabetes, Osteoarthritis] Date Acquired: [10:06/24/2018] [7:06/24/2018] [8:06/24/2018] Weeks of Treatment: [10:0] [7:0] [8:0] Wound Status: [10:Open] [7:Open] [8:Open] Measurements L x W x D [10:0.5x0.5x0.1] [7:7.2x4.7x0.1] [8:2.3x1.6x0.1] (cm) Area (cm) : [10:0.196] [7:26.578] [8:2.89] Volume (cm) : [10:0.02] [7:2.658] [8:0.289] % Reduction in Area: [10:N/A] [7:0.00%] [8:N/A] % Reduction in Volume: [10:N/A] [7:0.00%] [8:N/A] Classification: [10:Partial Thickness] [7:Full Thickness Without Exposed Support Structures] [8:Full Thickness Without Exposed Support Structures] Exudate Amount: [10:Large] [7:Large] [8:Large] Exudate Type: [10:Serous] [7:Purulent] [8:Serosanguineous] Exudate Color: [10:amber] [7:yellow, brown, green] [8:red, brown] Wound Margin: [10:Flat and Intact] [7:Distinct, outline attached] [  8:Distinct, outline attached] Granulation Amount: [10:Large (67-100%)] [7:Small (1-33%)] [8:Medium (34-66%)] Granulation Quality: [10:Red, Pink] [7:Red, Pink] [8:Red, Pink] Necrotic Amount: [10:Small (1-33%)] [7:Large (67-100%)] [8:Medium (34-66%)] Exposed Structures: [10:Fascia: No Fat Layer (Subcutaneous Tissue) Exposed: No Tendon: No Muscle: No Joint: No Bone: No] [7:Fascia: No Fat Layer (Subcutaneous Tissue) Exposed: No Tendon: No Muscle: No Joint: No Bone: No] [8:Fascia: No Fat Layer  (Subcutaneous Tissue)  Exposed: No Tendon: No Muscle: No Joint: No Bone: No] Epithelialization: [10:None] [7:Small (1-33%)] [8:None] Periwound Skin Texture: Excoriation: No Excoriation: No Excoriation: No Induration: No Induration: No Induration: No Callus: No Callus: No Callus: No Crepitus: No Crepitus: No Crepitus: No Rash: No Rash: No Rash: No Scarring: No Scarring: No Scarring: No Periwound Skin Moisture: Maceration: No Dry/Scaly: Yes Maceration: No Dry/Scaly: No Maceration: No Dry/Scaly: No Periwound Skin Color: Atrophie Blanche: No Atrophie Blanche: No Atrophie Blanche: No Cyanosis: No Cyanosis: No Cyanosis: No Ecchymosis: No Ecchymosis: No Ecchymosis: No Erythema: No Erythema: No Erythema: No Hemosiderin Staining: No Hemosiderin Staining: No Hemosiderin Staining: No Mottled: No Mottled: No Mottled: No Pallor: No Pallor: No Pallor: No Rubor: No Rubor: No Rubor: No Temperature: No Abnormality No Abnormality N/A Tenderness on Palpation: Yes Yes No Wound Preparation: Ulcer Cleansing: Ulcer Cleansing: Ulcer Cleansing: Rinsed/Irrigated with Saline Rinsed/Irrigated with Saline Rinsed/Irrigated with Saline Topical Anesthetic Applied: Topical Anesthetic Applied: Topical Anesthetic Applied: Other: lidocaine 4% Other: lidocaine 4% Other: lidocaine 4% Wound Number: 9 N/A N/A Photos: No Photos N/A N/A Wound Location: Left Lower Leg - Anterior, N/A N/A Proximal Wounding Event: Blister N/A N/A Primary Etiology: Lymphedema N/A N/A Comorbid History: Cataracts, Anemia, Asthma, N/A N/A Chronic Obstructive Pulmonary Disease (COPD), Arrhythmia, Coronary Artery Disease, Hypertension, Type II Diabetes, Osteoarthritis Date Acquired: 06/24/2018 N/A N/A Weeks of Treatment: 0 N/A N/A Wound Status: Open N/A N/A Measurements L x W x D 0.7x0.7x0.1 N/A N/A (cm) Area (cm) : 0.385 N/A N/A Volume (cm) : 0.038 N/A N/A % Reduction in Area: N/A N/A N/A %  Reduction in Volume: N/A N/A N/A Classification: Partial Thickness N/A N/A Exudate Amount: Medium N/A N/A Exudate Type: Serous N/A N/A Exudate Color: amber N/A N/A Wound Margin: Flat and Intact N/A N/A Granulation Amount: Medium (34-66%) N/A N/A Granulation Quality: Red, Pink N/A N/A Necrotic Amount: Medium (34-66%) N/A N/A Exposed Structures: Fascia: No N/A N/A Fat Layer (Subcutaneous Tissue) Exposed: No Tendon: No Muscle: No Victor Daniels, MATHERS. ([id number]) Joint: No Bone: No Epithelialization: None N/A N/A Periwound Skin Texture: Excoriation: No N/A N/A Induration: No Callus: No Crepitus: No Rash: No Scarring: No Periwound Skin Moisture: Maceration: No N/A N/A Dry/Scaly: No Periwound Skin Color: Atrophie Blanche: No N/A N/A Cyanosis: No Ecchymosis: No Erythema: No Hemosiderin Staining: No Mottled: No Pallor: No Rubor: No Temperature: No Abnormality N/A N/A Tenderness on Palpation: Yes N/A N/A Wound Preparation: Ulcer Cleansing: N/A N/A Rinsed/Irrigated with Saline Topical Anesthetic Applied: Other: lidocaine 4% Treatment Notes Wound #10 (Left, Distal, Anterior Lower Leg) 1. Cleansed with: Clean wound with Normal Saline 2. Anesthetic Topical Lidocaine 4% cream to wound bed prior to debridement 3. Peri-wound Care: Moisturizing lotion Other peri-wound care (specify in notes) 4. Dressing Applied: Other dressing (specify in notes) 7. Secured with 3 Layer Compression System - Left Lower Extremity Notes Tca cream and moisturizer cream, silvercell, xtrasorb, abd Wound #7 (Left, Medial, Anterior Lower Leg) 1. Cleansed with: Clean wound with Normal Saline 2. Anesthetic Topical Lidocaine 4% cream to wound bed prior to debridement 3. Peri-wound Care: Moisturizing lotion Other peri-wound care (specify in notes)  4. Dressing Applied: Other dressing (specify in notes) 7. Secured with Victor Daniels, Victor Daniels ([id number]) 3 Layer Compression System - Left  Lower Extremity Notes Tca cream and moisturizer cream, silvercell, xtrasorb, abd Wound #8 (Left, Medial, Posterior Lower Leg) 1. Cleansed with: Clean wound with Normal Saline 2. Anesthetic Topical Lidocaine 4% cream to wound bed prior to debridement 3. Peri-wound Care: Moisturizing lotion Other peri-wound care (specify in notes) 4. Dressing Applied: Other dressing (specify in notes) 7. Secured with 3 Layer Compression System - Left Lower Extremity Notes Tca cream and moisturizer cream, silvercell, xtrasorb, abd Wound #9 (Left, Proximal, Anterior Lower Leg) 1. Cleansed with: Clean wound with Normal Saline 2. Anesthetic Topical Lidocaine 4% cream to wound bed prior to debridement 3. Peri-wound Care: Moisturizing lotion Other peri-wound care (specify in notes) 4. Dressing Applied: Other dressing (specify in notes) 7. Secured with 3 Layer Compression System - Left Lower Extremity Notes Tca cream and moisturizer cream, silvercell, xtrasorb, abd Electronic Signature(s) Signed: 07/24/2018 6:17:43 PM By: Baltazar Najjar MD Entered By: Baltazar Najjar on 07/24/2018 12:37:06 Victor Daniels ([id number]) -------------------------------------------------------------------------------- Multi-Disciplinary Care Plan Details Patient Name: REG, BIRCHER. Date of Service: 07/24/2018 9:45 AM Medical Record Number: [id number] Patient Account Number: [id number] Date of Birth/Sex: 1946/04/14 (72 y.o. M) Treating RN: Victor Daniels Primary Care Aracely Rickett: Victor Daniels Other Clinician: Referring Inioluwa Baris: Victor Daniels Treating Kenner Lewan/Extender: Victor [place] in Treatment: 0 Active Inactive ` Abuse / Safety / Falls / Self Care Management Nursing Diagnoses: Self care deficit: actual or potential Goals: Patient/caregiver will verbalize understanding of skin care regimen Date Initiated: 07/24/2018 Target Resolution Date: 07/26/2018 Goal Status:  Active Interventions: Podiatry chair, stretcher in low position and side rails up as needed Notes: ` Nutrition Nursing Diagnoses: Impaired glucose control: actual or potential Goals: Patient/caregiver verbalizes understanding of need to maintain therapeutic glucose control per primary care physician Date Initiated: 07/24/2018 Target Resolution Date: 07/26/2018 Goal Status: Active Interventions: Provide education on elevated blood sugars and impact on wound healing Treatment Activities: Patient referred to Primary Care Physician for further nutritional evaluation : 07/24/2018 Notes: ` Orientation to the Wound Care Program Nursing Diagnoses: Knowledge deficit related to the wound healing center program Goals: Patient/caregiver will verbalize understanding of the Wound Healing Center Program Date Initiated: 07/24/2018 Target Resolution Date: 07/26/2018 Victor Daniels, Victor Daniels ([id number]) Goal Status: Active Interventions: Provide education on orientation to the wound center Notes: ` Venous Leg Ulcer Nursing Diagnoses: Actual venous Insuffiency (use after diagnosis is confirmed) Knowledge deficit related to disease process and management Goals: Patient/caregiver will verbalize understanding of disease process and disease management Date Initiated: 07/24/2018 Target Resolution Date: 07/26/2018 Goal Status: Active Interventions: Compression as ordered Treatment Activities: Non-invasive vascular studies : 07/24/2018 Notes: ` Wound/Skin Impairment Nursing Diagnoses: Impaired tissue integrity Goals: Ulcer/skin breakdown will have a volume reduction of 30% by week 4 Date Initiated: 07/24/2018 Target Resolution Date: 08/23/2018 Goal Status: Active Interventions: Assess ulceration(s) every visit Provide education on ulcer and skin care Treatment Activities: Referred to DME Junnie Loschiavo for dressing supplies : 07/24/2018 Notes: Electronic Signature(s) Signed: 07/26/2018 6:17:48 PM By:  Victor Daniels, BSN, RN, CWS, Kim RN, BSN Entered By: Victor Daniels, BSN, RN, CWS, Kim on 07/24/2018 11:17:32 Victor Daniels ([id number]) -------------------------------------------------------------------------------- Pain Assessment Details Patient Name: Victor Daniels, MENDONSA. Date of Service: 07/24/2018 9:45 AM Medical Record Number: [id number] Patient Account Number: [id number] Date of Birth/Sex: [birth year] (72 y.o. M) Treating RN: Victor Daniels Primary Care Rhyse Skowron: Victor Daniels Other Clinician: Referring Reece Fehnel: Victor Daniels Treating Chaynce Schafer/Extender: Victor [place]  in Treatment: 0 Active Problems Location of Pain Severity and Description of Pain Patient Has Paino Yes Site Locations Duration of the Pain. Constant / Intermittento Intermittent How Long Does it Lasto Hours: Minutes: 30 Rate the pain. Current Pain Level: 5 Character of Pain Describe the Pain: Burning Pain Management and Medication Current Pain Management: Electronic Signature(s) Signed: 07/24/2018 5:09:20 PM By: Victor CriglerFlinchum, Cheryl Entered By: Victor CriglerFlinchum, Cheryl on 07/24/2018 10:20:16 Victor KempKIRCHGESSNER, Victor R. ([id number]) -------------------------------------------------------------------------------- Patient/Caregiver Education Details Patient Name: Victor KempKIRCHGESSNER, Victor R. Date of Service: 07/24/2018 9:45 AM Medical Record Number: [id number] Patient Account Number: [id number] Date of Birth/Gender: 24-Nov-1946 94(71 y.o. M) Treating RN: Victor CoventryWoody, Kim Primary Care Physician: Victor Dullullo, Teresa Other Clinician: Referring Physician: Duncan Dullullo, Teresa Treating Physician/Extender: Victor CarolinaOBSON, MICHAEL G Weeks in Treatment: 0 Education Assessment Education Provided To: Patient Education Topics Provided Wound Debridement: Handouts: Wound Debridement Methods: Explain/Verbal Responses: State content correctly Wound/Skin Impairment: Methods: Explain/Verbal Responses: State content correctly Electronic Signature(s) Signed:  07/26/2018 6:17:48 PM By: Victor GurneyWoody, BSN, RN, CWS, Kim RN, BSN Entered By: Victor GurneyWoody, BSN, RN, CWS, Kim on 07/24/2018 12:01:19 Victor KempKIRCHGESSNER, Victor R. ([id number]) -------------------------------------------------------------------------------- Wound Assessment Details Patient Name: Victor KempKIRCHGESSNER, [place] R. Date of Service: 07/24/2018 9:45 AM Medical Record Number: [id number] Patient Account Number: [id number] Date of Birth/Sex: 24-Nov-1946 74(71 y.o. M) Treating RN: Victor CriglerFlinchum, Cheryl Primary Care Charlisha Market: Victor Dullullo, Teresa Other Clinician: Referring Mackensey Bolte: Victor Dullullo, Teresa Treating Jessicalynn Deshong/Extender: Victor CarolinaOBSON, MICHAEL G Weeks in Treatment: 0 Wound Status Wound Number: 10 Primary Lymphedema Etiology: Wound Location: Left Lower Leg - Anterior, Distal Wound Open Wounding Event: Blister Status: Date Acquired: 06/24/2018 Comorbid Cataracts, Anemia, Asthma, Chronic Obstructive Weeks Of Treatment: 0 History: Pulmonary Disease (COPD), Arrhythmia, Clustered Wound: No Coronary Artery Disease, Hypertension, Type II Diabetes, Osteoarthritis Wound Measurements Length: (cm) 0.5 Width: (cm) 0.5 Depth: (cm) 0.1 Area: (cm) 0.196 Volume: (cm) 0.02 % Reduction in Area: % Reduction in Volume: Epithelialization: None Tunneling: No Undermining: No Wound Description Classification: Partial Thickness Wound Margin: Flat and Intact Exudate Amount: Large Exudate Type: Serous Exudate Color: amber Foul Odor After Cleansing: No Slough/Fibrino Yes Wound Bed Granulation Amount: Large (67-100%) Exposed Structure Granulation Quality: Red, Pink Fascia Exposed: No Necrotic Amount: Small (1-33%) Fat Layer (Subcutaneous Tissue) Exposed: No Necrotic Quality: Adherent Slough Tendon Exposed: No Muscle Exposed: No Joint Exposed: No Bone Exposed: No Periwound Skin Texture Texture Color No Abnormalities Noted: No No Abnormalities Noted: No Callus: No Atrophie Blanche: No Crepitus: No Cyanosis: No Excoriation:  No Ecchymosis: No Induration: No Erythema: No Rash: No Hemosiderin Staining: No Scarring: No Mottled: No Pallor: No Moisture Rubor: No No Abnormalities Noted: No Dry / Scaly: No Temperature / Pain Odetta PinkKIRCHGESSNER, Halo R. ([id number]) Maceration: No Temperature: No Abnormality Tenderness on Palpation: Yes Wound Preparation Ulcer Cleansing: Rinsed/Irrigated with Saline Topical Anesthetic Applied: Other: lidocaine 4%, Treatment Notes Wound #10 (Left, Distal, Anterior Lower Leg) 1. Cleansed with: Clean wound with Normal Saline 2. Anesthetic Topical Lidocaine 4% cream to wound bed prior to debridement 3. Peri-wound Care: Moisturizing lotion Other peri-wound care (specify in notes) 4. Dressing Applied: Other dressing (specify in notes) 7. Secured with 3 Layer Compression System - Left Lower Extremity Notes Tca cream and moisturizer cream, silvercell, xtrasorb, abd Electronic Signature(s) Signed: 07/24/2018 5:09:20 PM By: Victor CriglerFlinchum, Cheryl Entered By: Victor CriglerFlinchum, Cheryl on 07/24/2018 10:46:21 Victor KempKIRCHGESSNER, Donovan R. ([id number]) -------------------------------------------------------------------------------- Wound Assessment Details Patient Name: Victor KempKIRCHGESSNER, Elo R. Date of Service: 07/24/2018 9:45 AM Medical Record Number: [id number] Patient Account Number: [id number] Date of Birth/Sex: 24-Nov-1946 8(71 y.o. M) Treating RN: Victor CriglerFlinchum, Cheryl Primary Care Bastion Bolger: Victor Dullullo, Teresa Other  Clinician: Referring Keyler Hoge: Victor Daniels Treating Odelle Kosier/Extender: Victor [place] in Treatment: 0 Wound Status Wound Number: 7 Primary Lymphedema Etiology: Wound Location: Left Lower Leg - Medial, Anterior Wound Open Wounding Event: Blister Status: Date Acquired: 06/24/2018 Comorbid Cataracts, Anemia, Asthma, Chronic Obstructive Weeks Of Treatment: 0 History: Pulmonary Disease (COPD), Arrhythmia, Clustered Wound: No Coronary Artery Disease, Hypertension, Type II Diabetes,  Osteoarthritis Photos Photo Uploaded By: Victor Daniels on 07/24/2018 16:23:35 Wound Measurements Length: (cm) 7.2 Width: (cm) 4.7 Depth: (cm) 0.1 Area: (cm) 26.578 Volume: (cm) 2.658 % Reduction in Area: 0% % Reduction in Volume: 0% Epithelialization: Small (1-33%) Tunneling: No Undermining: No Wound Description Full Thickness Without Exposed Support Classification: Structures Wound Margin: Distinct, outline attached Exudate Large Amount: Exudate Type: Purulent Exudate Color: yellow, brown, green Foul Odor After Cleansing: No Slough/Fibrino Yes Wound Bed Granulation Amount: Small (1-33%) Exposed Structure Granulation Quality: Red, Pink Fascia Exposed: No Necrotic Amount: Large (67-100%) Fat Layer (Subcutaneous Tissue) Exposed: No Necrotic Quality: Adherent Slough Tendon Exposed: No Muscle Exposed: No Joint Exposed: No JOSEJUAN, HOAGLIN R. ([id number]) Bone Exposed: No Periwound Skin Texture Texture Color No Abnormalities Noted: No No Abnormalities Noted: No Callus: No Atrophie Blanche: No Crepitus: No Cyanosis: No Excoriation: No Ecchymosis: No Induration: No Erythema: No Rash: No Hemosiderin Staining: No Scarring: No Mottled: No Pallor: No Moisture Rubor: No No Abnormalities Noted: No Dry / Scaly: Yes Temperature / Pain Maceration: No Temperature: No Abnormality Tenderness on Palpation: Yes Wound Preparation Ulcer Cleansing: Rinsed/Irrigated with Saline Topical Anesthetic Applied: Other: lidocaine 4%, Treatment Notes Wound #7 (Left, Medial, Anterior Lower Leg) 1. Cleansed with: Clean wound with Normal Saline 2. Anesthetic Topical Lidocaine 4% cream to wound bed prior to debridement 3. Peri-wound Care: Moisturizing lotion Other peri-wound care (specify in notes) 4. Dressing Applied: Other dressing (specify in notes) 7. Secured with 3 Layer Compression System - Left Lower Extremity Notes Tca cream and moisturizer cream,  silvercell, xtrasorb, abd Electronic Signature(s) Signed: 07/24/2018 5:09:20 PM By: Victor Daniels Entered By: Victor Daniels on 07/24/2018 10:37:20 Victor Daniels ([id number]) -------------------------------------------------------------------------------- Wound Assessment Details Patient Name: TRINITY, HYLAND. Date of Service: 07/24/2018 9:45 AM Medical Record Number: [id number] Patient Account Number: [id number] Date of Birth/Sex: 22-May-1946 (72 y.o. M) Treating RN: Victor Daniels Primary Care Agustin Swatek: Victor Daniels Other Clinician: Referring Charleton Deyoung: Victor Daniels Treating Ravon Mcilhenny/Extender: Victor Cromwell in Treatment: 0 Wound Status Wound Number: 8 Primary Lymphedema Etiology: Wound Location: Left Lower Leg - Medial, Posterior Wound Open Wounding Event: Blister Status: Date Acquired: 06/24/2018 Comorbid Cataracts, Anemia, Asthma, Chronic Obstructive Weeks Of Treatment: 0 History: Pulmonary Disease (COPD), Arrhythmia, Clustered Wound: No Coronary Artery Disease, Hypertension, Type II Diabetes, Osteoarthritis Photos Photo Uploaded By: Victor Daniels on 07/24/2018 16:23:56 Wound Measurements Length: (cm) 2.3 Width: (cm) 1.6 Depth: (cm) 0.1 Area: (cm) 2.89 Volume: (cm) 0.289 % Reduction in Area: % Reduction in Volume: Epithelialization: None Tunneling: No Undermining: No Wound Description Full Thickness Without Exposed Support Classification: Structures Wound Margin: Distinct, outline attached Exudate Large Amount: Exudate Type: Serosanguineous Exudate Color: red, brown Foul Odor After Cleansing: No Slough/Fibrino Yes Wound Bed Granulation Amount: Medium (34-66%) Exposed Structure Granulation Quality: Red, Pink Fascia Exposed: No Necrotic Amount: Medium (34-66%) Fat Layer (Subcutaneous Tissue) Exposed: No Necrotic Quality: Adherent Slough Tendon Exposed: No Muscle Exposed: No Joint Exposed: No LYALL, FACIANE R.  ([id number]) Bone Exposed: No Periwound Skin Texture Texture Color No Abnormalities Noted: No No Abnormalities Noted: No Callus: No Atrophie Blanche: No Crepitus: No Cyanosis: No Excoriation: No Ecchymosis: No  Induration: No Erythema: No Rash: No Hemosiderin Staining: No Scarring: No Mottled: No Pallor: No Moisture Rubor: No No Abnormalities Noted: No Dry / Scaly: No Maceration: No Wound Preparation Ulcer Cleansing: Rinsed/Irrigated with Saline Topical Anesthetic Applied: Other: lidocaine 4%, Treatment Notes Wound #8 (Left, Medial, Posterior Lower Leg) 1. Cleansed with: Clean wound with Normal Saline 2. Anesthetic Topical Lidocaine 4% cream to wound bed prior to debridement 3. Peri-wound Care: Moisturizing lotion Other peri-wound care (specify in notes) 4. Dressing Applied: Other dressing (specify in notes) 7. Secured with 3 Layer Compression System - Left Lower Extremity Notes Tca cream and moisturizer cream, silvercell, xtrasorb, abd Electronic Signature(s) Signed: 07/24/2018 5:09:20 PM By: Victor Daniels Entered By: Victor Daniels on 07/24/2018 10:40:16 Victor Daniels ([id number]) -------------------------------------------------------------------------------- Wound Assessment Details Patient Name: ROCKWELL, ZENTZ. Date of Service: 07/24/2018 9:45 AM Medical Record Number: [id number] Patient Account Number: [id number] Date of Birth/Sex: Dec 15, 1946 (72 y.o. M) Treating RN: Victor Daniels Primary Care Chidinma Clites: Victor Daniels Other Clinician: Referring Paizley Ramella: Victor Daniels Treating Shamel Germond/Extender: Victor [place]field in Treatment: 0 Wound Status Wound Number: 9 Primary Lymphedema Etiology: Wound Location: Left Lower Leg - Anterior, Proximal Wound Open Wounding Event: Blister Status: Date Acquired: 06/24/2018 Comorbid Cataracts, Anemia, Asthma, Chronic Obstructive Weeks Of Treatment: 0 History: Pulmonary Disease (COPD),  Arrhythmia, Clustered Wound: No Coronary Artery Disease, Hypertension, Type II Diabetes, Osteoarthritis Photos Photo Uploaded By: Victor Daniels on 07/24/2018 16:24:17 Wound Measurements Length: (cm) 0.7 Width: (cm) 0.7 Depth: (cm) 0.1 Area: (cm) 0.385 Volume: (cm) 0.038 % Reduction in Area: % Reduction in Volume: Epithelialization: None Tunneling: No Undermining: No Wound Description Classification: Partial Thickness Wound Margin: Flat and Intact Exudate Amount: Medium Exudate Type: Serous Exudate Color: amber Foul Odor After Cleansing: No Slough/Fibrino Yes Wound Bed Granulation Amount: Medium (34-66%) Exposed Structure Granulation Quality: Red, Pink Fascia Exposed: No Necrotic Amount: Medium (34-66%) Fat Layer (Subcutaneous Tissue) Exposed: No Necrotic Quality: Adherent Slough Tendon Exposed: No Muscle Exposed: No Joint Exposed: No Bone Exposed: No DARROLD, BEZEK R. ([id number]) Periwound Skin Texture Texture Color No Abnormalities Noted: No No Abnormalities Noted: No Callus: No Atrophie Blanche: No Crepitus: No Cyanosis: No Excoriation: No Ecchymosis: No Induration: No Erythema: No Rash: No Hemosiderin Staining: No Scarring: No Mottled: No Pallor: No Moisture Rubor: No No Abnormalities Noted: No Dry / Scaly: No Temperature / Pain Maceration: No Temperature: No Abnormality Tenderness on Palpation: Yes Wound Preparation Ulcer Cleansing: Rinsed/Irrigated with Saline Topical Anesthetic Applied: Other: lidocaine 4%, Treatment Notes Wound #9 (Left, Proximal, Anterior Lower Leg) 1. Cleansed with: Clean wound with Normal Saline 2. Anesthetic Topical Lidocaine 4% cream to wound bed prior to debridement 3. Peri-wound Care: Moisturizing lotion Other peri-wound care (specify in notes) 4. Dressing Applied: Other dressing (specify in notes) 7. Secured with 3 Layer Compression System - Left Lower Extremity Notes Tca cream and moisturizer  cream, silvercell, xtrasorb, abd Electronic Signature(s) Signed: 07/24/2018 5:09:20 PM By: Victor Daniels Entered By: Victor Daniels on 07/24/2018 10:43:44 Victor Daniels ([id number]) -------------------------------------------------------------------------------- Vitals Details Patient Name: Victor Daniels. Date of Service: 07/24/2018 9:45 AM Medical Record Number: [id number] Patient Account Number: [id number] Date of Birth/Sex: Dec 23, 1946 (72 y.o. M) Treating RN: Victor Daniels Primary Care Toddy Boyd: Victor Daniels Other Clinician: Referring Tedric Leeth: Victor Daniels Treating Nhan Qualley/Extender: Victor [address] in Treatment: 0 Vital Signs Time Taken: 10:20 Temperature (F): 98.3 Height (in): 72 Pulse (bpm): 75 Source: Stated Respiratory Rate (breaths/min): 18 Weight (lbs): 293.4 Blood Pressure (mmHg): 139/86 Source: Measured Reference Range: 80 - 120 mg /  dl Body Mass Index (BMI): 39.8 Electronic Signature(s) Signed: 07/24/2018 5:09:20 PM By: Victor Daniels Entered By: Victor Daniels on 07/24/2018 10:21:09
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
## 2018-08-04 NOTE — Progress Notes (Signed)
BOW, BUNTYN ([id number]) Visit Report for 07/24/2018 Chief Complaint Document Details Patient Name: Victor Daniels, Victor Daniels. Date of Service: 07/24/2018 9:45 AM Medical Record Number: [id number] Patient Account Number: [id number] Date of Birth/Sex: 11/10/1946 (72 y.o. M) Treating RN: Huel Coventry Primary Care Provider: Duncan Dull Other Clinician: Referring Provider: Duncan Dull Treating Provider/Extender: Altamese [place] in Treatment: 0 Information Obtained from: Patient Chief Complaint Patient has bilateral venous stasis with varicosities and post probable thrombosis with hemosiderosis of both of his legs he has 3 ulcers on the posterior aspect of his left leg. 09/05/16; patient is here for review of 3 wounds on the lateral aspect of his left leg. He also states that there is weeping fluid out of his bilateral legs for the last month 10/17/17; patient is here for review of a wound on the plantar left great toe 07/24/18; patient is here for review of wounds on his left lower leg that it meant present for roughly 6 weeks Electronic Signature(s) Signed: 07/24/2018 6:17:43 PM By: Baltazar Najjar MD Entered By: Baltazar Najjar on 07/24/2018 12:37:53 Victor Daniels ([id number]) -------------------------------------------------------------------------------- HPI Details Patient Name: Victor Daniels. Date of Service: 07/24/2018 9:45 AM Medical Record Number: [id number] Patient Account Number: [id number] Date of Birth/Sex: [birth year] (72 y.o. M) Treating RN: Huel Coventry Primary Care Provider: Duncan Dull Other Clinician: Referring Provider: Duncan Dull Treating Provider/Extender: Altamese [place] in Treatment: 0 History of Present Illness Location: Patient has bilateral venous stasis with ulcerations with inflammation of the left leg Severity: The wounds are very clean and had been improving and measured smaller with silver collagen Duration: The  patient has had problems with venous ulcers bilaterally from time to time for the last couple of years HPI Description: He returns today in followup of his left lower extremity open wound secondary to chronic venous insufficiency and ulceration. He removed his wraps yesterday as he was going to see his primary care physician. His notice some increased drainage from the wound and his primary care doctor increased his diuretics. He has no fevers or chills. He does have compression garments but he does not wear them because her too hard to get on. He is looking into obtaining some with [employer] sides that may be easier for him to put on. In the meantime he would like to continue with his compression wraps. READMISSION 09/05/16; this patient is a 72 year old man who was in this clinic on 2 visits 2 years ago in 2015. At that point he had wounds on his left leg felt to be secondary to chronic venous insufficiency with bilateral hemosiderin deposition. He ultimately was discharged with graded pressure stockings. The patient states he has stockings and is reasonably compliant with them we didn't come in with him on today. Is apparently a borderline diabetic not currently on any treatment undergoing nonpharmacologic therapy with diet etc. The patient states he is actually gained weight however. We month ago the patient started to notice more edema in both legs. The skin cracked open and he has had 3 draining areas on the left leg. He states that both legs drain so that night he'll often wake up with his lower pajama legs wet. He has not felt systemically unwell. He does have some shortness of breath but no chest pain. He tells me that he went to see Dr. dew of vascular surgery and had some tests done in his legs. He was told that nothing further could be done surgically for this. I don't have  these notes order these tests at this point. His ABIs in this clinic were noncompressible at 1.57 on the left and  1.53 on the right 09/12/16; the patient had to take his wraps off on Sunday when he got the dressings wet. Nevertheless all his wound areas have closed there is no open area. He has severe bilateral venous insufficiency and has recently been to see Dr. dew Y haven't actually seen these records. Brought in the stockings he has these are 15-20 mmHg, this will be insufficient for him he will need at least 20-30 Hg perhaps 30-40s. We have given the address of elastic therapy and [place], with the measurements we should be able to get hematocrit compression by mail READMISSION 10//24/18; this is a 72 year old man who is a type II diabetic but is not on any current therapy other than diet. He has COPD on chronic oxygen a current nonsmoker. He tells me that about a month ago he was walking through a gait and managed to traumatize the plantar aspect of his left great toe. He developed a wound on the plantar aspect of the toe at the extreme base of the toe. He is been using topical antibiotics. We have seen him before in this clinic but predominantly related to chronic venous insufficiency wounds. He has seen Dr. dew in the past but I don't have any of those records at my fingertips. He has known noncompressible ABIs bilaterally. 10/24/17; this patient has a wound on his plantar left great toe right at the base of the toe. Aggressive debridement last week we have been using silver alginate and he thinks it is doing well 10/31/17; the patient's wound on his plantar left great toe proximally has totally healed. No debridement is necessary. I think this is going to be an issue a pressure relief. I have ordered arterial studies which are booked for 11/21. If these come to my inbox I'll call the patient with results.. He is a type II diabetic and has recurrent wounds READMISSION 07/24/18 Victor Daniels ([id number]) This is a now 72 year old man who we have had in this clinic 3 times in the past. In 2015  he was here for left leg ulcer felt to be secondary to chronic venous insufficiency. In September 2017 here for bilateral lower extremity wounds again felt to be secondary to chronic venous insufficiency. He was discharged on this Occasion with 2030 mm below-knee stockings although there was a comment that he would probably need more than that perhaps 30-40 below-knee stockings. The last time he was here was from 10/17/17 through 10/31/17. He had a traumatic wound on his left great toe that closed. I had ordered arterial studies on him at that point although it doesn't look like these were ever done. Currently the patient says about 6 weeks ago he started developing more swelling in the left leg. The left leg is chronically more swollen than the right but he said the swelling got to the point where his foot and ankle were tight. He then developed a large blistero Hematoma on the left anterior tibial area with 2 other small wounds anteriorly and one posteriorly. He is only been putting gauze on these. He has very old 20-30 mm below-knee stockings but I doubt he is getting enough compression from these. Past medical history is remarkable for COPD on what appears to be when necessary oxygen, obstructive sleep apnea [has not tolerated CPAP], type 2 diabetes on diet control with a recent hemoglobin A1c of 6.4.  also hypertension, coronary artery disease and congestive heart failure. The patient tells me he had a CT scan of his chest 6-8 weeks ago to follow-up for a left upper lobe pulmonary nodule. ABIs were noncompressible in this clinic bilaterally Electronic Signature(s) Signed: 07/24/2018 6:17:43 PM By: Baltazar Najjar MD Entered By: Baltazar Najjar on 07/24/2018 12:41:29 Victor Daniels ([id number]) -------------------------------------------------------------------------------- Physical Exam Details Patient Name: RAYMAR, JOINER. Date of Service: 07/24/2018 9:45 AM Medical Record  Number: [id number] Patient Account Number: [id number] Date of Birth/Sex: 10-04-1946 (72 y.o. M) Treating RN: Huel Coventry Primary Care Provider: Duncan Dull Other Clinician: Referring Provider: Duncan Dull Treating Provider/Extender: Altamese [place] in Treatment: 0 Constitutional Sitting or standing Blood Pressure is within target range for patient.. Pulse regular and within target range for patient.Marland Kitchen Respirations regular, non-labored and within target range.. Temperature is normal and within the target range for the patient.Marland Kitchen appears in no distress. Eyes Conjunctivae clear. No discharge. Respiratory slightly tachypneic. markedly reduced air entry bilaterally. Cardiovascular femoral and popliteal pulses palpable. pulses are palpable bilaterally work dorsalis pedis and posterior tibial. Edema present in both extremities.left greater than right. This is nonpitting. There is considerably more edema in the left leg extending into the left side than the right. Some of this may be chronic however it is difficult to exclude a DVT. Gastrointestinal (GI) distended but no evidence of ascites nontender. No liver or spleen enlargement or tenderness.. Genitourinary (GU) . Lymphatic none palpable in the popliteal or inguinal area. Integumentary (Hair, Skin) he has severe bilateral chronic venous changes with significant hemosiderin deposition and probably some degree of venous inflammation/stasis dermatitis. Psychiatric No evidence of depression, anxiety, or agitation. Calm, cooperative, and communicative. Appropriate interactions and affect.. Notes wound exam; he has a fairly large but superficial wound on the left anterior mid tibia. Necrotic debris washed off with gauze and saline. 2 small areas anteriorly and a quarter-sized area on the posterior calf. There is no obvious surrounding infection Electronic Signature(s) Signed: 07/24/2018 6:17:43 PM By: Baltazar Najjar MD Entered By:  Baltazar Najjar on 07/24/2018 12:45:35 Victor Daniels ([id number]) -------------------------------------------------------------------------------- Physician Orders Details Patient Name: AVINASH, MALTOS. Date of Service: 07/24/2018 9:45 AM Medical Record Number: [id number] Patient Account Number: [id number] Date of Birth/Sex: 12/09/1946 (72 y.o. M) Treating RN: Huel Coventry Primary Care Provider: Duncan Dull Other Clinician: Referring Provider: Duncan Dull Treating Provider/Extender: Altamese [place] in Treatment: 0 Verbal / Phone Orders: No Diagnosis Coding Wound Cleansing Wound #10 Left,Distal,Anterior Lower Leg o Cleanse wound with mild soap and water Wound #7 Left,Medial,Anterior Lower Leg o Cleanse wound with mild soap and water Wound #8 Left,Medial,Posterior Lower Leg o Cleanse wound with mild soap and water Wound #9 Left,Proximal,Anterior Lower Leg o Cleanse wound with mild soap and water Anesthetic (add to Medication List) Wound #10 Left,Distal,Anterior Lower Leg o Topical Lidocaine 4% cream applied to wound bed prior to debridement (In Clinic Only). Wound #7 Left,Medial,Anterior Lower Leg o Topical Lidocaine 4% cream applied to wound bed prior to debridement (In Clinic Only). Wound #8 Left,Medial,Posterior Lower Leg o Topical Lidocaine 4% cream applied to wound bed prior to debridement (In Clinic Only). Wound #9 Left,Proximal,Anterior Lower Leg o Topical Lidocaine 4% cream applied to wound bed prior to debridement (In Clinic Only). Skin Barriers/Peri-Wound Care Wound #10 Left,Distal,Anterior Lower Leg o Triamcinolone Acetonide Ointment (TCA) Wound #7 Left,Medial,Anterior Lower Leg o Triamcinolone Acetonide Ointment (TCA) Wound #8 Left,Medial,Posterior Lower Leg o Triamcinolone Acetonide Ointment (TCA) Wound #9 Left,Proximal,Anterior Lower Leg o Triamcinolone  Acetonide Ointment (TCA) Primary Wound Dressing Wound #10  Left,Distal,Anterior Lower Leg o Silver Alginate Wound #7 Left,Medial,Anterior Lower Leg HIROSHI, KRUMMEL ([id number]) o Silver Alginate Wound #8 Left,Medial,Posterior Lower Leg o Silver Alginate Wound #9 Left,Proximal,Anterior Lower Leg o Silver Alginate Secondary Dressing Wound #10 Left,Distal,Anterior Lower Leg o ABD pad Wound #7 Left,Medial,Anterior Lower Leg o ABD pad Wound #8 Left,Medial,Posterior Lower Leg o ABD pad Wound #9 Left,Proximal,Anterior Lower Leg o ABD pad Dressing Change Frequency Wound #10 Left,Distal,Anterior Lower Leg o Other: - Twice weekly Wound #7 Left,Medial,Anterior Lower Leg o Other: - Twice weekly Wound #8 Left,Medial,Posterior Lower Leg o Other: - Twice weekly Wound #9 Left,Proximal,Anterior Lower Leg o Other: - Twice weekly Follow-up Appointments Wound #10 Left,Distal,Anterior Lower Leg o Return Appointment in 1 week. o Nurse Visit as needed Wound #7 Left,Medial,Anterior Lower Leg o Return Appointment in 1 week. o Nurse Visit as needed Wound #8 Left,Medial,Posterior Lower Leg o Return Appointment in 1 week. o Nurse Visit as needed Wound #9 Left,Proximal,Anterior Lower Leg o Return Appointment in 1 week. o Nurse Visit as needed Edema Control Wound #10 Left,Distal,Anterior Lower Leg o 3 Layer Compression System - Left Lower Extremity Wound #7 Left,Medial,Anterior Lower Leg DAIMON, KEAN ([id number]) o 3 Layer Compression System - Left Lower Extremity Wound #8 Left,Medial,Posterior Lower Leg o 3 Layer Compression System - Left Lower Extremity Wound #9 Left,Proximal,Anterior Lower Leg o 3 Layer Compression System - Left Lower Extremity [employer] o [place] to rule out DVT - Left Leg Electronic Signature(s) Signed: 07/24/2018 6:17:43 PM By: Baltazar Najjar MD Signed: 07/26/2018 6:17:48 PM By: Elliot Gurney, BSN, RN, CWS, Kim RN, BSN Entered By: Elliot Gurney, BSN, RN, CWS, Kim on 07/24/2018  11:58:03 Victor Daniels ([id number]) -------------------------------------------------------------------------------- Problem List Details Patient Name: ROCKET, GUNDERSON. Date of Service: 07/24/2018 9:45 AM Medical Record Number: [id number] Patient Account Number: [id number] Date of Birth/Sex: [birth year] (72 y.o. M) Treating RN: Huel Coventry Primary Care Provider: Duncan Dull Other Clinician: Referring Provider: Duncan Dull Treating Provider/Extender: Altamese [place] in Treatment: 0 Active Problems ICD-10 Evaluated Encounter Code Description Active Date Today Diagnosis L97.221 Non-pressure chronic ulcer of left calf limited to breakdown of 07/24/2018 No Yes skin I87.332 Chronic venous hypertension (idiopathic) with ulcer and 07/24/2018 No Yes inflammation of left lower extremity I89.0 Lymphedema, not elsewhere classified 07/24/2018 No Yes E11.622 Type 2 diabetes mellitus with other skin ulcer 07/24/2018 No Yes Inactive Problems Resolved Problems Electronic Signature(s) Signed: 07/24/2018 6:17:43 PM By: Baltazar Najjar MD Entered By: Baltazar Najjar on 07/24/2018 12:42:00 Victor Daniels ([id number]) -------------------------------------------------------------------------------- Progress Note Details Patient Name: Victor Daniels. Date of Service: 07/24/2018 9:45 AM Medical Record Number: [id number] Patient Account Number: [id number] Date of Birth/Sex: 13-Oct-1946 (72 y.o. M) Treating RN: Huel Coventry Primary Care Provider: Duncan Dull Other Clinician: Referring Provider: Duncan Dull Treating Provider/Extender: Altamese [place] in Treatment: 0 Subjective Chief Complaint Information obtained from Patient Patient has bilateral venous stasis with varicosities and post probable thrombosis with hemosiderosis of both of his legs he has 3 ulcers on the posterior aspect of his left leg. 09/05/16; patient is here for review of 3 wounds on the  lateral aspect of his left leg. He also states that there is weeping fluid out of his bilateral legs for the last month 10/17/17; patient is here for review of a wound on the plantar left great toe 07/24/18; patient is here for review of wounds on his left lower leg that it meant present for roughly 6 weeks History  of Present Illness (HPI) The following HPI elements were documented for the patient's wound: Location: Patient has bilateral venous stasis with ulcerations with inflammation of the left leg Severity: The wounds are very clean and had been improving and measured smaller with silver collagen Duration: The patient has had problems with venous ulcers bilaterally from time to time for the last couple of years He returns today in followup of his left lower extremity open wound secondary to chronic venous insufficiency and ulceration. He removed his wraps yesterday as he was going to see his primary care physician. His notice some increased drainage from the wound and his primary care doctor increased his diuretics. He has no fevers or chills. He does have compression garments but he does not wear them because her too hard to get on. He is looking into obtaining some with [employer] sides that may be easier for him to put on. In the meantime he would like to continue with his compression wraps. READMISSION 09/05/16; this patient is a 72 year old man who was in this clinic on 2 visits 2 years ago in 2015. At that point he had wounds on his left leg felt to be secondary to chronic venous insufficiency with bilateral hemosiderin deposition. He ultimately was discharged with graded pressure stockings. The patient states he has stockings and is reasonably compliant with them we didn't come in with him on today. Is apparently a borderline diabetic not currently on any treatment undergoing nonpharmacologic therapy with diet etc. The patient states he is actually gained weight however. We month ago the  patient started to notice more edema in both legs. The skin cracked open and he has had 3 draining areas on the left leg. He states that both legs drain so that night he'll often wake up with his lower pajama legs wet. He has not felt systemically unwell. He does have some shortness of breath but no chest pain. He tells me that he went to see Dr. dew of vascular surgery and had some tests done in his legs. He was told that nothing further could be done surgically for this. I don't have these notes order these tests at this point. His ABIs in this clinic were noncompressible at 1.57 on the left and 1.53 on the right 09/12/16; the patient had to take his wraps off on Sunday when he got the dressings wet. Nevertheless all his wound areas have closed there is no open area. He has severe bilateral venous insufficiency and has recently been to see Dr. dew Y haven't actually seen these records. Brought in the stockings he has these are 15-20 mmHg, this will be insufficient for him he will need at least 20-30 Hg perhaps 30-40s. We have given the address of elastic therapy and [place], with the measurements we should be able to get hematocrit compression by mail READMISSION 10//24/18; this is a 72 year old man who is a type II diabetic but is not on any current therapy other than diet. He has COPD on chronic oxygen a current nonsmoker. He tells me that about a month ago he was walking through a gait and managed to traumatize the plantar aspect of his left great toe. He developed a wound on the plantar aspect of the toe at the extreme base of the toe. He is been using topical antibiotics. We have seen him before in this clinic but predominantly related to chronic JAEGER, TRUEHEART R. ([id number]) venous insufficiency wounds. He has seen Dr. dew in the past but I  don't have any of those records at my fingertips. He has known noncompressible ABIs bilaterally. 10/24/17; this patient has a wound on his plantar  left great toe right at the base of the toe. Aggressive debridement last week we have been using silver alginate and he thinks it is doing well 10/31/17; the patient's wound on his plantar left great toe proximally has totally healed. No debridement is necessary. I think this is going to be an issue a pressure relief. I have ordered arterial studies which are booked for 11/21. If these come to my inbox I'll call the patient with results.. He is a type II diabetic and has recurrent wounds READMISSION 07/24/18 This is a now 72 year old man who we have had in this clinic 3 times in the past. In 2015 he was here for left leg ulcer felt to be secondary to chronic venous insufficiency. In September 2017 here for bilateral lower extremity wounds again felt to be secondary to chronic venous insufficiency. He was discharged on this Occasion with 2030 mm below-knee stockings although there was a comment that he would probably need more than that perhaps 30-40 below-knee stockings. The last time he was here was from 10/17/17 through 10/31/17. He had a traumatic wound on his left great toe that closed. I had ordered arterial studies on him at that point although it doesn't look like these were ever done. Currently the patient says about 6 weeks ago he started developing more swelling in the left leg. The left leg is chronically more swollen than the right but he said the swelling got to the point where his foot and ankle were tight. He then developed a large blistero Hematoma on the left anterior tibial area with 2 other small wounds anteriorly and one posteriorly. He is only been putting gauze on these. He has very old 20-30 mm below-knee stockings but I doubt he is getting enough compression from these. Past medical history is remarkable for COPD on what appears to be when necessary oxygen, obstructive sleep apnea [has not tolerated CPAP], type 2 diabetes on diet control with a recent hemoglobin A1c of 6.4.  also hypertension, coronary artery disease and congestive heart failure. The patient tells me he had a CT scan of his chest 6-8 weeks ago to follow-up for a left upper lobe pulmonary nodule. ABIs were noncompressible in this clinic bilaterally Wound History Patient reportedly has not tested positive for osteomyelitis. Patient reportedly has not had testing performed to evaluate circulation in the legs. Patient History Information obtained from Patient. Allergies No Known Drug Allergies Family History Heart Disease - Father,Mother, Hypertension - Father,Mother, Stroke - SELF, Thyroid Problems - Mother, No family history of Cancer, Diabetes, Hereditary Spherocytosis, Kidney Disease, Lung Disease, Seizures, Tuberculosis. Social History Former smoker, Marital Status - Married, Alcohol Use - Never - quit, Drug Use - No History, Caffeine Use - Daily - coffee, coke. Medical And Surgical History Notes Respiratory pneumonectomy of right lung Genitourinary have only one kidney OLOF, MARCIL. ([id number]) Objective Constitutional Sitting or standing Blood Pressure is within target range for patient.. Pulse regular and within target range for patient.Marland Kitchen Respirations regular, non-labored and within target range.. Temperature is normal and within the target range for the patient.Marland Kitchen appears in no distress. Vitals Time Taken: 10:20 AM, Height: 72 in, Source: Stated, Weight: 293.4 lbs, Source: Measured, BMI: 39.8, Temperature: 98.3 F, Pulse: 75 bpm, Respiratory Rate: 18 breaths/min, Blood Pressure: 139/86 mmHg. Eyes Conjunctivae clear. No discharge. Respiratory slightly tachypneic. markedly reduced air  entry bilaterally. Cardiovascular femoral and popliteal pulses palpable. pulses are palpable bilaterally work dorsalis pedis and posterior tibial. Edema present in both extremities.left greater than right. This is nonpitting. There is considerably more edema in the left leg extending  into the left side than the right. Some of this may be chronic however it is difficult to exclude a DVT. Gastrointestinal (GI) distended but no evidence of ascites nontender. No liver or spleen enlargement or tenderness.. Lymphatic none palpable in the popliteal or inguinal area. Psychiatric No evidence of depression, anxiety, or agitation. Calm, cooperative, and communicative. Appropriate interactions and affect.. General Notes: wound exam; he has a fairly large but superficial wound on the left anterior mid tibia. Necrotic debris washed off with gauze and saline. 2 small areas anteriorly and a quarter-sized area on the posterior calf. There is no obvious surrounding infection Integumentary (Hair, Skin) he has severe bilateral chronic venous changes with significant hemosiderin deposition and probably some degree of venous inflammation/stasis dermatitis. Wound #10 status is Open. Original cause of wound was Blister. The wound is located on the [facility] Lower Leg. The wound measures 0.5cm length x 0.5cm width x 0.1cm depth; 0.196cm^2 area and 0.02cm^3 volume. There is no tunneling or undermining noted. There is a large amount of serous drainage noted. The wound margin is flat and intact. There is large (67-100%) red, pink granulation within the wound bed. There is a small (1-33%) amount of necrotic tissue within the wound bed including Adherent Slough. The periwound skin appearance did not exhibit: Callus, Crepitus, Excoriation, Induration, Rash, Scarring, Dry/Scaly, Maceration, Atrophie Blanche, Cyanosis, Ecchymosis, Hemosiderin Staining, Mottled, Pallor, Rubor, Erythema. Periwound temperature was noted as No Abnormality. The periwound has tenderness on palpation. Wound #7 status is Open. Original cause of wound was Blister. The wound is located on the Left,Medial,Anterior Lower Leg. The wound measures 7.2cm length x 4.7cm width x 0.1cm depth; 26.578cm^2 area and 2.658cm^3 volume.  There is no tunneling or undermining noted. There is a large amount of purulent drainage noted. The wound margin is distinct with the outline attached to the wound base. There is small (1-33%) red, pink granulation within the wound bed. There is a large ([address], [place] R. ([id number]) 100%) amount of necrotic tissue within the wound bed including Adherent Slough. The periwound skin appearance exhibited: Dry/Scaly. The periwound skin appearance did not exhibit: Callus, Crepitus, Excoriation, Induration, Rash, Scarring, Maceration, Atrophie Blanche, Cyanosis, Ecchymosis, Hemosiderin Staining, Mottled, Pallor, Rubor, Erythema. Periwound temperature was noted as No Abnormality. The periwound has tenderness on palpation. Wound #8 status is Open. Original cause of wound was Blister. The wound is located on the Left,Medial,Posterior Lower Leg. The wound measures 2.3cm length x 1.6cm width x 0.1cm depth; 2.89cm^2 area and 0.289cm^3 volume. There is no tunneling or undermining noted. There is a large amount of serosanguineous drainage noted. The wound margin is distinct with the outline attached to the wound base. There is medium (34-66%) red, pink granulation within the wound bed. There is a medium (34-66%) amount of necrotic tissue within the wound bed including Adherent Slough. The periwound skin appearance did not exhibit: Callus, Crepitus, Excoriation, Induration, Rash, Scarring, Dry/Scaly, Maceration, Atrophie Blanche, Cyanosis, Ecchymosis, Hemosiderin Staining, Mottled, Pallor, Rubor, Erythema. Wound #9 status is Open. Original cause of wound was Blister. The wound is located on the Left,Proximal,Anterior Lower Leg. The wound measures 0.7cm length x 0.7cm width x 0.1cm depth; 0.385cm^2 area and 0.038cm^3 volume. There is no tunneling or undermining noted. There is a medium amount of serous  drainage noted. The wound margin is flat and intact. There is medium (34-66%) red, pink granulation  within the wound bed. There is a medium (34-66%) amount of necrotic tissue within the wound bed including Adherent Slough. The periwound skin appearance did not exhibit: Callus, Crepitus, Excoriation, Induration, Rash, Scarring, Dry/Scaly, Maceration, Atrophie Blanche, Cyanosis, Ecchymosis, Hemosiderin Staining, Mottled, Pallor, Rubor, Erythema. Periwound temperature was noted as No Abnormality. The periwound has tenderness on palpation. Assessment Active Problems ICD-10 Non-pressure chronic ulcer of left calf limited to breakdown of skin Chronic venous hypertension (idiopathic) with ulcer and inflammation of left lower extremity Lymphedema, not elsewhere classified Type 2 diabetes mellitus with other skin ulcer Plan Wound Cleansing: Wound #10 Left,Distal,Anterior Lower Leg: Cleanse wound with mild soap and water Wound #7 Left,Medial,Anterior Lower Leg: Cleanse wound with mild soap and water Wound #8 Left,Medial,Posterior Lower Leg: Cleanse wound with mild soap and water Wound #9 Left,Proximal,Anterior Lower Leg: Cleanse wound with mild soap and water Anesthetic (add to Medication List): Wound #10 Left,Distal,Anterior Lower Leg: Topical Lidocaine 4% cream applied to wound bed prior to debridement (In Clinic Only). Wound #7 Left,Medial,Anterior Lower Leg: Topical Lidocaine 4% cream applied to wound bed prior to debridement (In Clinic Only). Wound #8 Left,Medial,Posterior Lower Leg: Topical Lidocaine 4% cream applied to wound bed prior to debridement (In Clinic Only). Victor KempKIRCHGESSNER, [place] R. ([id number]) Wound #9 Left,Proximal,Anterior Lower Leg: Topical Lidocaine 4% cream applied to wound bed prior to debridement (In Clinic Only). Skin Barriers/Peri-Wound Care: Wound #10 Left,Distal,Anterior Lower Leg: Triamcinolone Acetonide Ointment (TCA) Wound #7 Left,Medial,Anterior Lower Leg: Triamcinolone Acetonide Ointment (TCA) Wound #8 Left,Medial,Posterior Lower Leg: Triamcinolone  Acetonide Ointment (TCA) Wound #9 Left,Proximal,Anterior Lower Leg: Triamcinolone Acetonide Ointment (TCA) Primary Wound Dressing: Wound #10 Left,Distal,Anterior Lower Leg: Silver Alginate Wound #7 Left,Medial,Anterior Lower Leg: Silver Alginate Wound #8 Left,Medial,Posterior Lower Leg: Silver Alginate Wound #9 Left,Proximal,Anterior Lower Leg: Silver Alginate Secondary Dressing: Wound #10 Left,Distal,Anterior Lower Leg: ABD pad Wound #7 Left,Medial,Anterior Lower Leg: ABD pad Wound #8 Left,Medial,Posterior Lower Leg: ABD pad Wound #9 Left,Proximal,Anterior Lower Leg: ABD pad Dressing Change Frequency: Wound #10 Left,Distal,Anterior Lower Leg: Other: - Twice weekly Wound #7 Left,Medial,Anterior Lower Leg: Other: - Twice weekly Wound #8 Left,Medial,Posterior Lower Leg: Other: - Twice weekly Wound #9 Left,Proximal,Anterior Lower Leg: Other: - Twice weekly Follow-up Appointments: Wound #10 Left,Distal,Anterior Lower Leg: Return Appointment in 1 week. Nurse Visit as needed Wound #7 Left,Medial,Anterior Lower Leg: Return Appointment in 1 week. Nurse Visit as needed Wound #8 Left,Medial,Posterior Lower Leg: Return Appointment in 1 week. Nurse Visit as needed Wound #9 Left,Proximal,Anterior Lower Leg: Return Appointment in 1 week. Nurse Visit as needed Edema Control: Wound #10 Left,Distal,Anterior Lower Leg: 3 Layer Compression System - Left Lower Extremity Wound #7 Left,Medial,Anterior Lower Leg: 3 Layer Compression System - Left Lower Extremity Wound #8 Left,Medial,Posterior Lower Leg: 3 Layer Compression System - Left Lower Extremity Wound #9 Left,Proximal,Anterior Lower Leg: 3 Layer Compression System - Left Lower Extremity Victor KempKIRCHGESSNER, [place] R. ([id number]) ordered were: US to rule out DVT - Left Leg #1 acute DVT rule out left thigh. He states his left leg is chronically more swollen than the right although there is a measurable difference here some degree of  warmth but no tenderness. All of this may be chronic although I felt it reasonable to go ahead and make sure of this before we put him in compression #2 dyspnea; patient states he has severe COPD in fact he just saw his pulmonologist yesterday. He has had a recent CT scan of the  chest. Apparently no changes were made to his medical regimen which includes oxygen at night and with activity #3 the wounds he has R on the left calf especially anteriorly. All of these are superficial and I think are related to uncontrolled lymphedema and chronic venous inflammation. He will need compression in order to get these to heal. He has stockings although they are old and I'm doubtful that these would be helpful at this point #4 he has no convincing evidence at the bedside of CHF #5 is a type II diabetic on diet control. I had asked him to get formal arterial studies when he was in the clinic in late 2018 with a left great toe wound that was never done. I am able to feel his pulses including dorsalis pedis posterior tibial and for the purposes of compression I think he can tolerate a 3 layer compression. I need would be reasonable to go ahead and do the arterial studies at a later date. The acute DVT study needed to be done now. #6 tells me he has an appointment with Dr. Lorretta Harp in the next 2 weeks Electronic Signature(s) Signed: 07/24/2018 6:17:43 PM By: Baltazar Najjar MD Entered By: Baltazar Najjar on 07/24/2018 12:48:35 Victor Daniels ([id number]) -------------------------------------------------------------------------------- ROS/PFSH Details Patient Name: Victor Daniels. Date of Service: 07/24/2018 9:45 AM Medical Record Number: [id number] Patient Account Number: [id number] Date of Birth/Sex: 06-30-1946 (72 y.o. M) Treating RN: Renne Crigler Primary Care Provider: Duncan Dull Other Clinician: Referring Provider: Duncan Dull Treating Provider/Extender: Altamese [place] in  Treatment: 0 Information Obtained From Patient Wound History Do you currently have one or more open woundso Yes Approximately how long have you had your woundso 6 weeks How have you been treating your wound(s) until nowo gauze Has your wound(s) ever healed and then re-openedo No Have you had any lab work done in the past montho No Have you tested positive for an antibiotic resistant organism (MRSA, VRE)o No Have you tested positive for osteomyelitis (bone infection)o No Have you had any tests for circulation on your legso No Eyes Medical History: Positive for: Cataracts - removed Hematologic/Lymphatic Medical History: Positive for: Anemia Respiratory Medical History: Positive for: Asthma; Chronic Obstructive Pulmonary Disease (COPD) Negative for: Sleep Apnea Past Medical History Notes: pneumonectomy of right lung Cardiovascular Medical History: Positive for: Arrhythmia; Coronary Artery Disease; Hypertension Endocrine Medical History: Positive for: Type II Diabetes Treated with: Diet Genitourinary Medical History: Past Medical History Notes: have only one kidney Musculoskeletal DONNELLE, OLMEDA ([id number]) Medical History: Positive for: Osteoarthritis Oncologic Medical History: Negative for: Received Chemotherapy; Received Radiation HBO Extended History Items Eyes: Cataracts Immunizations Pneumococcal Vaccine: Received Pneumococcal Vaccination: Yes Immunization Notes: up to date Implantable Devices Family and Social History Cancer: No; Diabetes: No; Heart Disease: Yes - Father,Mother; Hereditary Spherocytosis: No; Hypertension: Yes - Father,Mother; Kidney Disease: No; Lung Disease: No; Seizures: No; Stroke: Yes - SELF; Thyroid Problems: Yes - Mother; Tuberculosis: No; Former smoker; Marital Status - Married; Alcohol Use: Never - quit; Drug Use: No History; Caffeine Use: Daily - coffee, coke; Financial Concerns: No; Food, Clothing or Shelter Needs: No;  Support System Lacking: No; Transportation Concerns: No; Advanced Directives: No; Patient does not want information on Advanced Directives; Living Will: No Electronic Signature(s) Signed: 07/24/2018 5:09:20 PM By: Renne Crigler Signed: 07/24/2018 6:17:43 PM By: Baltazar Najjar MD Entered By: Renne Crigler on 07/24/2018 10:25:57 Victor Daniels ([id number]) -------------------------------------------------------------------------------- SuperBill Details Patient Name: BRENON, ANTOSH. Date of Service: 07/24/2018 Medical Record Number: [id number] Patient  Account Number: [id number] Date of Birth/Sex: [birth year] (72 y.o. M) Treating RN: Huel Coventry Primary Care Provider: Duncan Dull Other Clinician: Referring Provider: Duncan Dull Treating Provider/Extender: Altamese [place] in Treatment: 0 Diagnosis Coding ICD-10 Codes Code Description [phone] Non-pressure chronic ulcer of left calf limited to breakdown of skin I87.332 Chronic venous hypertension (idiopathic) with ulcer and inflammation of left lower extremity I89.0 Lymphedema, not elsewhere classified E11.622 Type 2 diabetes mellitus with other skin ulcer Facility Procedures CPT4 Code: 30865784 Description: 99213 - WOUND CARE VISIT-LEV 3 EST PT Modifier: Quantity: 1 CPT4 Code: 69629528 Description: (Facility Use Only) 29581LT - APPLY MULTLAY COMPRS LWR LT LEG Modifier: Quantity: 1 Physician Procedures CPT4: Description Modifier Quantity Code [id number] 99214 - WC PHYS LEVEL 4 - EST PT 1 ICD-10 Diagnosis Description L97.221 Non-pressure chronic ulcer of left calf limited to breakdown of skin I87.332 Chronic venous hypertension (idiopathic) with ulcer and  inflammation of left lower extremity I89.0 Lymphedema, not elsewhere classified E11.622 Type 2 diabetes mellitus with other skin ulcer Electronic Signature(s) Signed: 07/26/2018 8:06:43 AM By: Elliot Gurney, BSN, RN, CWS, Kim RN, BSN Previous Signature: 07/24/2018  6:17:43 PM Version By: Baltazar Najjar MD Entered By: Elliot Gurney, BSN, RN, CWS, Kim on 07/26/2018 08:06:40
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
## 2018-08-05 ENCOUNTER — Encounter: Payer: Self-pay | Admitting: Urology

## 2018-08-05 ENCOUNTER — Ambulatory Visit (INDEPENDENT_AMBULATORY_CARE_PROVIDER_SITE_OTHER): Payer: Medicare HMO | Admitting: Urology

## 2018-08-05 ENCOUNTER — Telehealth: Payer: Self-pay | Admitting: Cardiovascular Disease

## 2018-08-05 ENCOUNTER — Other Ambulatory Visit: Payer: Self-pay

## 2018-08-05 VITALS — BP 150/79 | HR 89 | Ht 72.0 in | Wt 290.4 lb

## 2018-08-05 DIAGNOSIS — N492 Inflammatory disorders of scrotum: Secondary | ICD-10-CM | POA: Diagnosis not present

## 2018-08-05 LAB — WOUND CULTURE

## 2018-08-05 NOTE — Telephone Encounter (Signed)
Patient last seen 11/22/16 with Dr. Mariah MillingGollan. His procedure is under general anesthesia.  Any work in spots available with Dr. Mariah MillingGollan prior to 08/27/18?

## 2018-08-05 NOTE — Telephone Encounter (Signed)
   [facility] Pre-operative Risk Assessment    Request for surgical clearance:  1. What type of surgery is being performed? Excision and drainage of cyst in left groin  2. When is this surgery scheduled? 08/27/2018  3. What type of clearance is required (medical clearance vs. Pharmacy clearance to hold med vs. Both)? Not listed  4. Are there any medications that need to be held prior to surgery and how long?not listed  5. Practice name and name of physician performing surgery? [facility]  6. What is your office phone number [phone]   7.   What is your office fax [phone]  8.   Anesthesia type (None, local, MAC, general) ? general   Victor Daniels 08/05/2018, 12:21 PM  _________________________________________________________________   (provider comments below)

## 2018-08-06 ENCOUNTER — Telehealth: Payer: Self-pay | Admitting: Internal Medicine

## 2018-08-06 ENCOUNTER — Telehealth: Payer: Self-pay | Admitting: Radiology

## 2018-08-06 ENCOUNTER — Other Ambulatory Visit: Payer: Self-pay | Admitting: Radiology

## 2018-08-06 DIAGNOSIS — N492 Inflammatory disorders of scrotum: Secondary | ICD-10-CM

## 2018-08-06 DIAGNOSIS — J449 Chronic obstructive pulmonary disease, unspecified: Secondary | ICD-10-CM | POA: Diagnosis not present

## 2018-08-06 MED ORDER — DOXYCYCLINE HYCLATE 100 MG PO CAPS: 100.0000 mg | ORAL_CAPSULE | Freq: Two times a day (BID) | ORAL | 0 refills | Status: DC

## 2018-08-06 MED ORDER — DOXYCYCLINE HYCLATE 100 MG PO CAPS: 100.0000 mg | ORAL_CAPSULE | Freq: Two times a day (BID) | ORAL | 0 refills | Status: AC

## 2018-08-06 MED ORDER — CIPROFLOXACIN HCL 500 MG PO TABS: 500.0000 mg | ORAL_TABLET | Freq: Two times a day (BID) | ORAL | 0 refills | Status: DC

## 2018-08-06 MED ORDER — CIPROFLOXACIN HCL 500 MG PO TABS: 500.0000 mg | ORAL_TABLET | Freq: Two times a day (BID) | ORAL | 0 refills | Status: AC

## 2018-08-06 NOTE — Telephone Encounter (Signed)
rx request 

## 2018-08-06 NOTE — Telephone Encounter (Signed)
Copied from CRM [id number]. Topic: Quick Communication - Rx Refill/Question >> Aug 06, 2018 10:27 AM Burchel, Abbi R wrote: Medication: ciprofloxacin (CIPRO) 500 MG tablet and abx from ER  Pt is requesting a refill on his abx for abscess in groin area.  Pt: [phone]  [facility] [address] - [place]),  - [address] [place] [id number] Phone: [phone] Fax: [phone]

## 2018-08-06 NOTE — Telephone Encounter (Signed)
Orders received to refill Cipro & doxycycline to last until surgery per Michiel CowboyShannon McGowan. Patient informed of scripts sent to pharmacy. Patient voices understanding.

## 2018-08-07 ENCOUNTER — Encounter: Payer: Medicare HMO | Admitting: Nurse Practitioner

## 2018-08-07 DIAGNOSIS — I890 Lymphedema, not elsewhere classified: Secondary | ICD-10-CM | POA: Diagnosis not present

## 2018-08-07 DIAGNOSIS — E11622 Type 2 diabetes mellitus with other skin ulcer: Secondary | ICD-10-CM | POA: Diagnosis not present

## 2018-08-07 DIAGNOSIS — I509 Heart failure, unspecified: Secondary | ICD-10-CM | POA: Diagnosis not present

## 2018-08-07 DIAGNOSIS — L97222 Non-pressure chronic ulcer of left calf with fat layer exposed: Secondary | ICD-10-CM | POA: Diagnosis not present

## 2018-08-07 DIAGNOSIS — I872 Venous insufficiency (chronic) (peripheral): Secondary | ICD-10-CM | POA: Diagnosis not present

## 2018-08-07 DIAGNOSIS — I110 Hypertensive heart disease with heart failure: Secondary | ICD-10-CM | POA: Diagnosis not present

## 2018-08-07 DIAGNOSIS — L97829 Non-pressure chronic ulcer of other part of left lower leg with unspecified severity: Secondary | ICD-10-CM | POA: Diagnosis not present

## 2018-08-07 DIAGNOSIS — I87332 Chronic venous hypertension (idiopathic) with ulcer and inflammation of left lower extremity: Secondary | ICD-10-CM | POA: Diagnosis not present

## 2018-08-07 DIAGNOSIS — L97822 Non-pressure chronic ulcer of other part of left lower leg with fat layer exposed: Secondary | ICD-10-CM | POA: Diagnosis not present

## 2018-08-07 DIAGNOSIS — I2510 Atherosclerotic heart disease of native coronary artery without angina pectoris: Secondary | ICD-10-CM | POA: Diagnosis not present

## 2018-08-07 DIAGNOSIS — L97221 Non-pressure chronic ulcer of left calf limited to breakdown of skin: Secondary | ICD-10-CM | POA: Diagnosis not present

## 2018-08-07 DIAGNOSIS — J449 Chronic obstructive pulmonary disease, unspecified: Secondary | ICD-10-CM | POA: Diagnosis not present

## 2018-08-07 NOTE — Telephone Encounter (Signed)
Patient is calling and states his neurologist refilled this medication for him.

## 2018-08-07 NOTE — Telephone Encounter (Signed)
LMTCB. Need a little more information as to why the pt is requesting a refill on the cipro. Is the abscess still draining, does the drainage have color, is there red streaking traveling outside the abscess, fever, etc? PEC may speak with pt.

## 2018-08-07 NOTE — Telephone Encounter (Signed)
FYI

## 2018-08-08 ENCOUNTER — Other Ambulatory Visit: Payer: Self-pay | Admitting: Internal Medicine

## 2018-08-08 NOTE — Telephone Encounter (Signed)
Needs appt

## 2018-08-09 ENCOUNTER — Other Ambulatory Visit: Payer: Self-pay | Admitting: Internal Medicine

## 2018-08-09 NOTE — Telephone Encounter (Signed)
Scheduled 8/21

## 2018-08-11 NOTE — Progress Notes (Signed)
Victor Daniels, Prem R. ([id number]) Visit Report for 07/31/2018 Chief Complaint Document Details Patient Name: Victor Daniels, Victor R. Date of Service: 07/31/2018 1:00 PM Medical Record Number: [id number] Patient Account Number: [id number] Date of Birth/Sex: [birth year](71 y.o. M) Treating RN: Huel CoventryWoody, Kim Primary Care Provider: Duncan Dullullo, Teresa Other Clinician: Referring Provider: Duncan Dullullo, Teresa Treating Provider/Extender: Bonnell Publicoulter, Anaisabel Pederson Weeks in Treatment: 1 Information Obtained from: Patient Chief Complaint LLE Electronic Signature(s) Signed: 07/31/2018 2:31:28 PM By: Bonnell Publicoulter, Toia Micale Entered By: Bonnell Publicoulter, Angla Delahunt on 07/31/2018 14:31:28 Victor Daniels, Ryelan R. ([id number]) -------------------------------------------------------------------------------- Debridement Details Patient Name: Victor Daniels, Victor R. Date of Service: 07/31/2018 1:00 PM Medical Record Number: [id number] Patient Account Number: [id number] Date of Birth/Sex: [birth year](71 y.o. M) Treating RN: Huel CoventryWoody, Kim Primary Care Provider: Duncan Dullullo, Teresa Other Clinician: Referring Provider: Duncan Dullullo, Teresa Treating Provider/Extender: Kathreen Cosieroulter, Dayleen Beske Weeks in Treatment: 1 Debridement Performed for Wound #7 Left,Medial,Anterior Lower Leg Assessment: Performed By: Physician Bonnell Publicoulter, Cordney Barstow, NP Debridement Type: Debridement Pre-procedure Verification/Time Yes - 13:35 Out Taken: Start Time: 13:35 Pain Control: Other : lidocaine 4% Total Area Debrided (L x W): 7 (cm) x 5.4 (cm) = 37.8 (cm) Tissue and other material Viable, Non-Viable, Skin: Dermis debrided: Level: Skin/Dermis Debridement Description: Selective/Open Wound Instrument: Curette Bleeding: Minimum Hemostasis Achieved: Pressure End Time: 13:37 Procedural Pain: 0 Post Procedural Pain: 0 Response to Treatment: Procedure was tolerated well Level of Consciousness: Awake and Alert Post Debridement Measurements of Total Wound Length: (cm) 7 Width: (cm) 5.4 Depth: (cm)  0.1 Volume: (cm) 2.969 Character of Wound/Ulcer Post Debridement: Stable Post Procedure Diagnosis Same as Pre-procedure Electronic Signature(s) Signed: 07/31/2018 2:31:03 PM By: Bonnell Publicoulter, Cem Kosman Signed: 07/31/2018 5:11:54 PM By: Elliot GurneyWoody, BSN, RN, CWS, Kim RN, BSN Entered By: Bonnell Publicoulter, Arah Aro on 07/31/2018 14:31:02 Victor Daniels, Horice R. ([id number]) -------------------------------------------------------------------------------- HPI Details Patient Name: Victor Daniels, Victor R. Date of Service: 07/31/2018 1:00 PM Medical Record Number: [id number] Patient Account Number: [id number] Date of Birth/Sex: [birth year](71 y.o. M) Treating RN: Huel CoventryWoody, Kim Primary Care Provider: Duncan Dullullo, Teresa Other Clinician: Referring Provider: Duncan Dullullo, Teresa Treating Provider/Extender: Kathreen Cosieroulter, Karynn Deblasi Weeks in Treatment: 1 History of Present Illness Location: Patient has bilateral venous stasis with ulcerations with inflammation of the left leg Severity: The wounds are very clean and had been improving and measured smaller with silver collagen Duration: The patient has had problems with venous ulcers bilaterally from time to time for the last couple of years HPI Description: 07/24/18 This is a now 72 year old man who we have had in this clinic 3 times in the past. In 2015 he was here for left leg ulcer felt to be secondary to chronic venous insufficiency. In September 2017 here for bilateral lower extremity wounds again felt to be secondary to chronic venous insufficiency. He was discharged on this Occasion with 2030 mm below-knee stockings although there was a comment that he would probably need more than that perhaps 30-40 below-knee stockings. The last time he was here was from 10/17/17 through 10/31/17. He had a traumatic wound on his left great toe that closed. I had ordered arterial studies on him at that point although it doesn't look like these were ever done. Currently the patient says about 6 weeks ago he started  developing more swelling in the left leg. The left leg is chronically more swollen than the right but he said the swelling got to the point where his foot and ankle were tight. He then developed a large blistero Hematoma on the left anterior tibial area with 2 other small wounds anteriorly and one posteriorly. He is only been putting gauze  on these. He has very old 20-30 mm below-knee stockings but I doubt he is getting enough compression from these. Past medical history is remarkable for COPD on what appears to be when necessary oxygen, obstructive sleep apnea [has not tolerated CPAP], type 2 diabetes on diet control with a recent hemoglobin A1c of 6.4. also hypertension, coronary artery disease and congestive heart failure. The patient tells me he had a CT scan of his chest 6-8 weeks ago to follow-up for a left upper lobe pulmonary nodule. ABIs were noncompressible in this clinic bilaterally 07/31/18-He is seen in follow-up evaluation for left lateral lower leg ulceration. He is tolerating compression therapy. He has been submitted for new compression stockings. His current compression stockings appear to be low-grade (8-12, 15-20 mmHg), he also admits using these and properly applying at bedtime and removing in the morning. He has been reeducated on appropriate compression stocking where. We will continue with same treatment plan he will follow-up next week. He has also been encouraged to maintain compression, not to remove prior to follow up appointment Electronic Signature(s) Signed: 07/31/2018 2:37:12 PM By: Bonnell Public Entered By: Bonnell Public on 07/31/2018 14:37:12 Victor Kemp ([id number]) -------------------------------------------------------------------------------- Physician Orders Details Patient Name: Victor Kemp. Date of Service: 07/31/2018 1:00 PM Medical Record Number: [id number] Patient Account Number: [id number] Date of Birth/Sex: 1946/08/28 (72 y.o.  M) Treating RN: Huel Coventry Primary Care Provider: Duncan Dull Other Clinician: Referring Provider: Duncan Dull Treating Provider/Extender: Kathreen Cosier in Treatment: 1 Verbal / Phone Orders: No Diagnosis Coding Wound Cleansing Wound #10 Left,Distal,Anterior Lower Leg o Cleanse wound with mild soap and water Wound #7 Left,Medial,Anterior Lower Leg o Cleanse wound with mild soap and water Wound #9 Left,Proximal,Anterior Lower Leg o Cleanse wound with mild soap and water Anesthetic (add to Medication List) Wound #10 Left,Distal,Anterior Lower Leg o Topical Lidocaine 4% cream applied to wound bed prior to debridement (In Clinic Only). Wound #7 Left,Medial,Anterior Lower Leg o Topical Lidocaine 4% cream applied to wound bed prior to debridement (In Clinic Only). Wound #9 Left,Proximal,Anterior Lower Leg o Topical Lidocaine 4% cream applied to wound bed prior to debridement (In Clinic Only). Skin Barriers/Peri-Wound Care Wound #10 Left,Distal,Anterior Lower Leg o Barrier cream Wound #7 Left,Medial,Anterior Lower Leg o Barrier cream Wound #9 Left,Proximal,Anterior Lower Leg o Barrier cream Primary Wound Dressing Wound #10 Left,Distal,Anterior Lower Leg o Silver Alginate Wound #7 Left,Medial,Anterior Lower Leg o Silver Alginate Wound #9 Left,Proximal,Anterior Lower Leg o Silver Alginate Secondary Dressing Wound #10 Left,Distal,Anterior Lower Leg o ABD pad JURRELL, ROYSTER ([id number]) Wound #7 Left,Medial,Anterior Lower Leg o ABD pad Wound #9 Left,Proximal,Anterior Lower Leg o ABD pad Dressing Change Frequency Wound #10 Left,Distal,Anterior Lower Leg o Other: - Twice weekly Wound #7 Left,Medial,Anterior Lower Leg o Other: - Twice weekly Wound #9 Left,Proximal,Anterior Lower Leg o Other: - Twice weekly Follow-up Appointments Wound #10 Left,Distal,Anterior Lower Leg o Return Appointment in 1 week. o Nurse Visit as  needed Wound #7 Left,Medial,Anterior Lower Leg o Return Appointment in 1 week. o Nurse Visit as needed Wound #9 Left,Proximal,Anterior Lower Leg o Return Appointment in 1 week. o Nurse Visit as needed Edema Control Wound #10 Left,Distal,Anterior Lower Leg o 3 Layer Compression System - Left Lower Extremity Wound #7 Left,Medial,Anterior Lower Leg o 3 Layer Compression System - Left Lower Extremity Wound #9 Left,Proximal,Anterior Lower Leg o 3 Layer Compression System - Left Lower Extremity Additional Orders / Instructions Wound #10 Left,Distal,Anterior Lower Leg o Other: - ETI Information for 20-14mmhg stockings (50cm  to knee, 30cm ankle, 47cm calf) Wound #7 Left,Medial,Anterior Lower Leg o Other: - ETI Information for 20-56mmhg stockings (50cm to knee, 30cm ankle, 47cm calf) Wound #9 Left,Proximal,Anterior Lower Leg o Other: - ETI Information for 20-8mmhg stockings (50cm to knee, 30cm ankle, 47cm calf) Electronic Signature(s) Signed: 07/31/2018 4:31:15 PM By: Bonnell Public Signed: 07/31/2018 5:11:54 PM By: Elliot Gurney, BSN, RN, CWS, Kim RN, BSN [address] [place] R. ([id number]) Entered By: Elliot Gurney, BSN, RN, CWS, Kim on 07/31/2018 13:40:38 Victor Kemp ([id number]) -------------------------------------------------------------------------------- Problem List Details Patient Name: AAYDEN, CEFALU. Date of Service: 07/31/2018 1:00 PM Medical Record Number: [id number] Patient Account Number: [id number] Date of Birth/Sex: [birth year] (72 y.o. M) Treating RN: Huel Coventry Primary Care Provider: Duncan Dull Other Clinician: Referring Provider: Duncan Dull Treating Provider/Extender: Kathreen Cosier in Treatment: 1 Active Problems ICD-10 Evaluated Encounter Code Description Active Date Today Diagnosis L97.221 Non-pressure chronic ulcer of left calf limited to breakdown of 07/24/2018 No Yes skin I87.332 Chronic venous hypertension (idiopathic) with  ulcer and 07/24/2018 No Yes inflammation of left lower extremity I89.0 Lymphedema, not elsewhere classified 07/24/2018 No Yes E11.622 Type 2 diabetes mellitus with other skin ulcer 07/24/2018 No Yes Inactive Problems Resolved Problems Electronic Signature(s) Signed: 07/31/2018 2:04:57 PM By: Bonnell Public Entered By: Bonnell Public on 07/31/2018 14:04:56 Victor Kemp ([id number]) -------------------------------------------------------------------------------- Progress Note Details Patient Name: Victor Kemp. Date of Service: 07/31/2018 1:00 PM Medical Record Number: [id number] Patient Account Number: [id number] Date of Birth/Sex: Jun 05, 1946 (72 y.o. M) Treating RN: Huel Coventry Primary Care Provider: Duncan Dull Other Clinician: Referring Provider: Duncan Dull Treating Provider/Extender: Kathreen Cosier in Treatment: 1 Subjective Chief Complaint Information obtained from Patient LLE History of Present Illness (HPI) The following HPI elements were documented for the patient's wound: Location: Patient has bilateral venous stasis with ulcerations with inflammation of the left leg Severity: The wounds are very clean and had been improving and measured smaller with silver collagen Duration: The patient has had problems with venous ulcers bilaterally from time to time for the last couple of years 07/24/18 This is a now 72 year old man who we have had in this clinic 3 times in the past. In 2015 he was here for left leg ulcer felt to be secondary to chronic venous insufficiency. In September 2017 here for bilateral lower extremity wounds again felt to be secondary to chronic venous insufficiency. He was discharged on this Occasion with 2030 mm below-knee stockings although there was a comment that he would probably need more than that perhaps 30-40 below-knee stockings. The last time he was here was from 10/17/17 through 10/31/17. He had a traumatic wound on his left great  toe that closed. I had ordered arterial studies on him at that point although it doesn't look like these were ever done. Currently the patient says about 6 weeks ago he started developing more swelling in the left leg. The left leg is chronically more swollen than the right but he said the swelling got to the point where his foot and ankle were tight. He then developed a large blistero Hematoma on the left anterior tibial area with 2 other small wounds anteriorly and one posteriorly. He is only been putting gauze on these. He has very old 20-30 mm below-knee stockings but I doubt he is getting enough compression from these. Past medical history is remarkable for COPD on what appears to be when necessary oxygen, obstructive sleep apnea [has not tolerated CPAP], type 2 diabetes on diet control with a recent hemoglobin A1c  of 6.4. also hypertension, coronary artery disease and congestive heart failure. The patient tells me he had a CT scan of his chest 6-8 weeks ago to follow-up for a left upper lobe pulmonary nodule. ABIs were noncompressible in this clinic bilaterally 07/31/18-He is seen in follow-up evaluation for left lateral lower leg ulceration. He is tolerating compression therapy. He has been submitted for new compression stockings. His current compression stockings appear to be low-grade (8-12, 15-20 mmHg), he also admits using these and properly applying at bedtime and removing in the morning. He has been reeducated on appropriate compression stocking where. We will continue with same treatment plan he will follow-up next week. He has also been encouraged to maintain compression, not to remove prior to follow up appointment Objective DELL, BRINER ([id number]) Constitutional Vitals Time Taken: 1:05 PM, Height: 72 in, Weight: 293.4 lbs, BMI: 39.8, Temperature: 98.0 F, Pulse: 70 bpm, Respiratory Rate: 18 breaths/min, Blood Pressure: 131/66 mmHg. Integumentary (Hair, Skin) Wound  #10 status is Open. Original cause of wound was Blister. The wound is located on the [facility] Lower Leg. The wound measures 0.4cm length x 0.3cm width x 0.1cm depth; 0.094cm^2 area and 0.009cm^3 volume. There is no tunneling or undermining noted. There is a large amount of serous drainage noted. The wound margin is flat and intact. There is large (67-100%) red, pink granulation within the wound bed. There is a small (1-33%) amount of necrotic tissue within the wound bed including Adherent Slough. The periwound skin appearance did not exhibit: Callus, Crepitus, Excoriation, Induration, Rash, Scarring, Dry/Scaly, Maceration, Atrophie Blanche, Cyanosis, Ecchymosis, Hemosiderin Staining, Mottled, Pallor, Rubor, Erythema. Periwound temperature was noted as No Abnormality. The periwound has tenderness on palpation. Wound #7 status is Open. Original cause of wound was Blister. The wound is located on the Left,Medial,Anterior Lower Leg. The wound measures 7cm length x 5.4cm width x 0.1cm depth; 29.688cm^2 area and 2.969cm^3 volume. There is no tunneling or undermining noted. There is a large amount of serous drainage noted. The wound margin is distinct with the outline attached to the wound base. There is small (1-33%) red, pink granulation within the wound bed. There is a large (67- 100%) amount of necrotic tissue within the wound bed including Adherent Slough. The periwound skin appearance exhibited: Dry/Scaly. The periwound skin appearance did not exhibit: Callus, Crepitus, Excoriation, Induration, Rash, Scarring, Maceration, Atrophie Blanche, Cyanosis, Ecchymosis, Hemosiderin Staining, Mottled, Pallor, Rubor, Erythema. Periwound temperature was noted as No Abnormality. The periwound has tenderness on palpation. Wound #8 status is Healed - Epithelialized. Original cause of wound was Blister. The wound is located on the Left,Medial,Posterior Lower Leg. The wound measures 0cm length x 0cm width  x 0cm depth; 0cm^2 area and 0cm^3 volume. Wound #9 status is Open. Original cause of wound was Blister. The wound is located on the Left,Proximal,Anterior Lower Leg. The wound measures 1.2cm length x 0.9cm width x 0.1cm depth; 0.848cm^2 area and 0.085cm^3 volume. There is no tunneling or undermining noted. There is a medium amount of serous drainage noted. The wound margin is flat and intact. There is medium (34-66%) red, pink granulation within the wound bed. There is a medium (34-66%) amount of necrotic tissue within the wound bed including Adherent Slough. The periwound skin appearance did not exhibit: Callus, Crepitus, Excoriation, Induration, Rash, Scarring, Dry/Scaly, Maceration, Atrophie Blanche, Cyanosis, Ecchymosis, Hemosiderin Staining, Mottled, Pallor, Rubor, Erythema. Periwound temperature was noted as No Abnormality. The periwound has tenderness on palpation. Assessment Active Problems ICD-10 Non-pressure chronic ulcer of left calf  limited to breakdown of skin Chronic venous hypertension (idiopathic) with ulcer and inflammation of left lower extremity Lymphedema, not elsewhere classified Type 2 diabetes mellitus with other skin ulcer Procedures Wound #7 Pre-procedure diagnosis of Wound #7 is a Lymphedema located on the Left,Medial,Anterior Lower Leg . There was a Victor Daniels, Valon R. ([id number]) Selective/Open Wound Skin/Dermis Debridement with a total area of 37.8 sq cm performed by Bonnell Publicoulter, Anaia Frith, NP. With the following instrument(s): Curette to remove Viable and Non-Viable tissue/material. Material removed includes Skin: Dermis after achieving pain control using Other (lidocaine 4%). No specimens were taken. A time out was conducted at 13:35, prior to the start of the procedure. A Minimum amount of bleeding was controlled with Pressure. The procedure was tolerated well with a pain level of 0 throughout and a pain level of 0 following the procedure. Patient s Level of  Consciousness post procedure was recorded as Awake and Alert. Post Debridement Measurements: 7cm length x 5.4cm width x 0.1cm depth; 2.969cm^3 volume. Character of Wound/Ulcer Post Debridement is stable. Post procedure Diagnosis Wound #7: Same as Pre-Procedure Plan Wound Cleansing: Wound #10 Left,Distal,Anterior Lower Leg: Cleanse wound with mild soap and water Wound #7 Left,Medial,Anterior Lower Leg: Cleanse wound with mild soap and water Wound #9 Left,Proximal,Anterior Lower Leg: Cleanse wound with mild soap and water Anesthetic (add to Medication List): Wound #10 Left,Distal,Anterior Lower Leg: Topical Lidocaine 4% cream applied to wound bed prior to debridement (In Clinic Only). Wound #7 Left,Medial,Anterior Lower Leg: Topical Lidocaine 4% cream applied to wound bed prior to debridement (In Clinic Only). Wound #9 Left,Proximal,Anterior Lower Leg: Topical Lidocaine 4% cream applied to wound bed prior to debridement (In Clinic Only). Skin Barriers/Peri-Wound Care: Wound #10 Left,Distal,Anterior Lower Leg: Barrier cream Wound #7 Left,Medial,Anterior Lower Leg: Barrier cream Wound #9 Left,Proximal,Anterior Lower Leg: Barrier cream Primary Wound Dressing: Wound #10 Left,Distal,Anterior Lower Leg: Silver Alginate Wound #7 Left,Medial,Anterior Lower Leg: Silver Alginate Wound #9 Left,Proximal,Anterior Lower Leg: Silver Alginate Secondary Dressing: Wound #10 Left,Distal,Anterior Lower Leg: ABD pad Wound #7 Left,Medial,Anterior Lower Leg: ABD pad Wound #9 Left,Proximal,Anterior Lower Leg: ABD pad Dressing Change Frequency: Wound #10 Left,Distal,Anterior Lower Leg: Other: - Twice weekly Wound #7 Left,Medial,Anterior Lower Leg: Other: - Twice weekly Wound #9 Left,Proximal,Anterior Lower Leg: Other: - Twice weekly Victor Daniels, Reginaldo R. ([id number]) Follow-up Appointments: Wound #10 Left,Distal,Anterior Lower Leg: Return Appointment in 1 week. Nurse Visit as needed Wound  #7 Left,Medial,Anterior Lower Leg: Return Appointment in 1 week. Nurse Visit as needed Wound #9 Left,Proximal,Anterior Lower Leg: Return Appointment in 1 week. Nurse Visit as needed Edema Control: Wound #10 Left,Distal,Anterior Lower Leg: 3 Layer Compression System - Left Lower Extremity Wound #7 Left,Medial,Anterior Lower Leg: 3 Layer Compression System - Left Lower Extremity Wound #9 Left,Proximal,Anterior Lower Leg: 3 Layer Compression System - Left Lower Extremity Additional Orders / Instructions: Wound #10 Left,Distal,Anterior Lower Leg: Other: - ETI Information for 20-2930mmhg stockings (50cm to knee, 30cm ankle, 47cm calf) Wound #7 Left,Medial,Anterior Lower Leg: Other: - ETI Information for 20-6930mmhg stockings (50cm to knee, 30cm ankle, 47cm calf) Wound #9 Left,Proximal,Anterior Lower Leg: Other: - ETI Information for 20-730mmhg stockings (50cm to knee, 30cm ankle, 47cm calf) Electronic Signature(s) Signed: 07/31/2018 2:37:26 PM By: Bonnell Publicoulter, Marilynn Ekstein Entered By: Bonnell Publicoulter, Tatisha Cerino on 07/31/2018 14:37:25 Victor Daniels, Issak R. ([id number]) -------------------------------------------------------------------------------- SuperBill Details Patient Name: Victor Daniels, Sekai R. Date of Service: 07/31/2018 Medical Record Number: [id number] Patient Account Number: [id number] Date of Birth/Sex: April 19, 1946 [age] y.o. M) Treating RN: Huel CoventryWoody, Kim Primary Care Provider: Duncan Dullullo, Teresa Other Clinician: Referring Provider: Duncan Dullullo, Teresa  Treating Provider/Extender: Bonnell Public Weeks in Treatment: 1 Diagnosis Coding ICD-10 Codes Code Description [phone] Non-pressure chronic ulcer of left calf limited to breakdown of skin I87.332 Chronic venous hypertension (idiopathic) with ulcer and inflammation of left lower extremity I89.0 Lymphedema, not elsewhere classified E11.622 Type 2 diabetes mellitus with other skin ulcer Facility Procedures CPT4 Code: [id number] Description: [phone] - DEBRIDE WOUND 1ST 20 SQ  CM OR < ICD-10 Diagnosis Description L97.221 Non-pressure chronic ulcer of left calf limited to breakdown Modifier: of skin Quantity: 1 CPT4 Code: [id number] Description: 97598 - DEBRIDE WOUND EA ADDL 20 SQ CM ICD-10 Diagnosis Description L97.221 Non-pressure chronic ulcer of left calf limited to breakdown Modifier: of skin Quantity: 1 Physician Procedures CPT4 Code: [id number] Description: 97597 - WC PHYS DEBR WO ANESTH 20 SQ CM ICD-10 Diagnosis Description L97.221 Non-pressure chronic ulcer of left calf limited to breakdown o Modifier: f skin Quantity: 1 CPT4 Code: [id number] Description: 97598 - WC PHYS DEBR WO ANESTH EA ADD 20 CM ICD-10 Diagnosis Description L97.221 Non-pressure chronic ulcer of left calf limited to breakdown o Modifier: f skin Quantity: 1 Electronic Signature(s) Signed: 07/31/2018 2:37:39 PM By: Bonnell Public Entered By: Bonnell Public on 07/31/2018 14:37:38

## 2018-08-12 NOTE — Progress Notes (Signed)
Victor Daniels, Zackery R. ([id number]) Visit Report for 07/31/2018 Arrival Information Details Patient Name: Victor Daniels, Victor R. Date of Service: 07/31/2018 1:00 PM Medical Record Number: [id number] Patient Account Number: [id number] Date of Birth/Sex: March 18, 1946 9(71 y.o. M) Treating RN: Curtis Sitesorthy, Joanna Primary Care Drenda Sobecki: Duncan Dullullo, Teresa Other Clinician: Referring Kumar Falwell: Duncan Dullullo, Teresa Treating Jesica Goheen/Extender: Kathreen Cosieroulter, Leah Weeks in Treatment: 1 Visit Information History Since Last Visit Added or deleted any medications: No Patient Arrived: Ambulatory Any new allergies or adverse reactions: No Arrival Time: 13:04 Had a fall or experienced change in No Accompanied By: self activities of daily living that may affect Transfer Assistance: None risk of falls: Patient Identification Verified: Yes Signs or symptoms of abuse/neglect since last visito No Secondary Verification Process Yes Hospitalized since last visit: No Completed: Implantable device outside of the clinic excluding No Patient Has Alerts: Yes cellular tissue based products placed in the center Patient Alerts: [place] bilateral since last visit: 07/24/2018 Has Dressing in Place as Prescribed: Yes Has Compression in Place as Prescribed: No Pain Present Now: No Electronic Signature(s) Signed: 07/31/2018 3:43:39 PM By: Curtis Sitesorthy, Joanna Entered By: Curtis Sitesorthy, Joanna on 07/31/2018 13:04:45 Victor Daniels, Victor R. ([id number]) -------------------------------------------------------------------------------- Encounter Discharge Information Details Patient Name: Victor Daniels, Victor R. Date of Service: 07/31/2018 1:00 PM Medical Record Number: [id number] Patient Account Number: [id number] Date of Birth/Sex: March 18, 1946 7(71 y.o. M) Treating RN: Phillis HaggisPinkerton, Debi Primary Care Corneisha Alvi: Duncan Dullullo, Teresa Other Clinician: Referring Rosellen Lichtenberger: Duncan Dullullo, Teresa Treating Sloan Takagi/Extender: Kathreen Cosieroulter, Leah Weeks in Treatment: 1 Encounter Discharge  Information Items Discharge Condition: Stable Ambulatory Status: Ambulatory Discharge Destination: Home Transportation: Private Auto Accompanied By: self Schedule Follow-up Appointment: Yes Clinical Summary of Care: Electronic Signature(s) Signed: 08/01/2018 4:46:33 PM By: Alejandro MullingPinkerton, Debra Entered By: Alejandro MullingPinkerton, Debra on 07/31/2018 13:48:37 Victor Daniels, Victor R. ([id number]) -------------------------------------------------------------------------------- Lower Extremity Assessment Details Patient Name: Victor Daniels, Jaquavious R. Date of Service: 07/31/2018 1:00 PM Medical Record Number: [id number] Patient Account Number: [id number] Date of Birth/Sex: March 18, 1946 41(71 y.o. M) Treating RN: Curtis Sitesorthy, Joanna Primary Care Willer Osorno: Duncan Dullullo, Teresa Other Clinician: Referring Cady Hafen: Duncan Dullullo, Teresa Treating Minnah Llamas/Extender: Bonnell Publicoulter, Leah Weeks in Treatment: 1 Edema Assessment Assessed: [Left: No] [Right: No] [Left: Edema] [Right: :] Calf Left: Right: Point of Measurement: 33 cm From Medial Instep 48.1 cm cm Ankle Left: Right: Point of Measurement: 10 cm From Medial Instep 29.6 cm cm Vascular Assessment Pulses: Dorsalis Pedis Palpable: [Left:Yes] Posterior Tibial Extremity colors, hair growth, and conditions: Extremity Color: [Left:Hyperpigmented] Hair Growth on Extremity: [Left:No] Temperature of Extremity: [Left:Warm] Capillary Refill: [Left:< 3 seconds] Toe Nail Assessment Left: Right: Thick: Yes Discolored: Yes Deformed: No Improper Length and Hygiene: Yes Electronic Signature(s) Signed: 07/31/2018 3:43:39 PM By: Curtis Sitesorthy, Joanna Entered By: Curtis Sitesorthy, Joanna on 07/31/2018 13:17:11 Victor Daniels, Victor R. ([id number]) -------------------------------------------------------------------------------- Multi Wound Chart Details Patient Name: Victor Daniels, Guilherme R. Date of Service: 07/31/2018 1:00 PM Medical Record Number: [id number] Patient Account Number: [id number] Date of Birth/Sex:  March 18, 1946 [age] y.o. M) Treating RN: Huel CoventryWoody, Kim Primary Care Gladine Plude: Duncan Dullullo, Teresa Other Clinician: Referring Dempsey Ahonen: Duncan Dullullo, Teresa Treating Veora Fonte/Extender: Kathreen Cosieroulter, Leah Weeks in Treatment: 1 Vital Signs Height(in): 72 Pulse(bpm): 70 Weight(lbs): 293.4 Blood Pressure(mmHg): 131/66 Body Mass Index(BMI): 40 Temperature(F): 98.0 Respiratory Rate 18 (breaths/min): Photos: [10:No Photos] [7:No Photos] [8:No Photos] Wound Location: [10:Left Lower Leg - Anterior, Distal] [7:Left Lower Leg - Medial, Anterior] [8:Left, Medial, Posterior Lower Leg] Wounding Event: [10:Blister] [7:Blister] [8:Blister] Primary Etiology: [10:Lymphedema] [7:Lymphedema] [8:Lymphedema] Comorbid History: [10:Cataracts, Anemia, Asthma, Cataracts, Anemia, Asthma, N/A Chronic Obstructive Pulmonary Disease (COPD), Pulmonary Disease (COPD), Arrhythmia, Coronary Artery Arrhythmia, Coronary Artery Disease, Hypertension, Type II  Disease,  Hypertension, Type II Diabetes, Osteoarthritis] [7:Chronic Obstructive Diabetes, Osteoarthritis] Date Acquired: [10:06/24/2018] [7:06/24/2018] [8:06/24/2018] Weeks of Treatment: [10:1] [7:1] [8:1] Wound Status: [10:Open] [7:Open] [8:Healed - Epithelialized] Measurements L x W x D [10:0.4x0.3x0.1] [7:7x5.4x0.1] [8:0x0x0] (cm) Area (cm) : [10:0.094] [7:29.688] [8:0] Volume (cm) : [10:0.009] [7:2.969] [8:0] % Reduction in Area: [10:52.00%] [7:-11.70%] [8:100.00%] % Reduction in Volume: [10:55.00%] [7:-11.70%] [8:100.00%] Classification: [10:Partial Thickness] [7:Full Thickness Without Exposed Support Structures] [8:Full Thickness Without Exposed Support Structures] Exudate Amount: [10:Large] [7:Large] [8:N/A] Exudate Type: [10:Serous] [7:Serous] [8:N/A] Exudate Color: [10:amber] [7:amber] [8:N/A] Wound Margin: [10:Flat and Intact] [7:Distinct, outline attached] [8:N/A] Granulation Amount: [10:Large (67-100%)] [7:Small (1-33%)] [8:N/A] Granulation Quality: [10:Red, Pink] [7:Red,  Pink] [8:N/A] Necrotic Amount: [10:Small (1-33%)] [7:Large (67-100%)] [8:N/A] Exposed Structures: [10:Fascia: No Fat Layer (Subcutaneous Tissue) Exposed: No Tendon: No Muscle: No Joint: No Bone: No] [7:Fascia: No Fat Layer (Subcutaneous Tissue) Exposed: No Tendon: No Muscle: No Joint: No Bone: No] [8:N/A] Epithelialization: [10:Medium (34-66%)] [7:Small (1-33%)] [8:N/A] Debridement: N/A Debridement - Selective/Open N/A Wound Pre-procedure N/A 13:35 N/A Verification/Time Out Taken: Pain Control: N/A Other N/A Level: N/A Skin/Dermis N/A Debridement Area (sq cm): N/A 37.8 N/A Instrument: N/A Curette N/A Bleeding: N/A Minimum N/A Hemostasis Achieved: N/A Pressure N/A Procedural Pain: N/A 0 N/A Post Procedural Pain: N/A 0 N/A Debridement Treatment N/A Procedure was tolerated well N/A Response: Post Debridement N/A 7x5.4x0.1 N/A Measurements L x W x D (cm) Post Debridement Volume: N/A 2.969 N/A (cm) Periwound Skin Texture: Excoriation: No Excoriation: No No Abnormalities Noted Induration: No Induration: No Callus: No Callus: No Crepitus: No Crepitus: No Rash: No Rash: No Scarring: No Scarring: No Periwound Skin Moisture: Maceration: No Dry/Scaly: Yes No Abnormalities Noted Dry/Scaly: No Maceration: No Periwound Skin Color: Atrophie Blanche: No Atrophie Blanche: No No Abnormalities Noted Cyanosis: No Cyanosis: No Ecchymosis: No Ecchymosis: No Erythema: No Erythema: No Hemosiderin Staining: No Hemosiderin Staining: No Mottled: No Mottled: No Pallor: No Pallor: No Rubor: No Rubor: No Temperature: No Abnormality No Abnormality N/A Tenderness on Palpation: Yes Yes No Wound Preparation: Ulcer Cleansing: Ulcer Cleansing: N/A Rinsed/Irrigated with Saline, Rinsed/Irrigated with Saline, Other: soap and water Other: soap and water Topical Anesthetic Applied: Topical Anesthetic Applied: Other: lidocaine 4% Other: lidocaine 4% Procedures Performed: N/A Debridement  N/A Wound Number: 9 N/A N/A Photos: No Photos N/A N/A Wound Location: Left Lower Leg - Anterior, N/A N/A Proximal Wounding Event: Blister N/A N/A Primary Etiology: Lymphedema N/A N/A Comorbid History: Cataracts, Anemia, Asthma, N/A N/A Chronic Obstructive Pulmonary Disease (COPD), Arrhythmia, Coronary Artery Disease, Hypertension, Type II Diabetes, Osteoarthritis Date Acquired: 06/24/2018 N/A N/A Victor Daniels, Victor R. ([id number]) Weeks of Treatment: 1 N/A N/A Wound Status: Open N/A N/A Measurements L x W x D 1.2x0.9x0.1 N/A N/A (cm) Area (cm) : 0.848 N/A N/A Volume (cm) : 0.085 N/A N/A % Reduction in Area: -120.30% N/A N/A % Reduction in Volume: -123.70% N/A N/A Classification: Partial Thickness N/A N/A Exudate Amount: Medium N/A N/A Exudate Type: Serous N/A N/A Exudate Color: amber N/A N/A Wound Margin: Flat and Intact N/A N/A Granulation Amount: Medium (34-66%) N/A N/A Granulation Quality: Red, Pink N/A N/A Necrotic Amount: Medium (34-66%) N/A N/A Exposed Structures: Fascia: No N/A N/A Fat Layer (Subcutaneous Tissue) Exposed: No Tendon: No Muscle: No Joint: No Bone: No Epithelialization: None N/A N/A Debridement: N/A N/A N/A Pain Control: N/A N/A N/A Level: N/A N/A N/A Debridement Area (sq cm): N/A N/A N/A Instrument: N/A N/A N/A Bleeding: N/A N/A N/A Hemostasis Achieved: N/A N/A N/A Procedural Pain: N/A N/A N/A  Post Procedural Pain: N/A N/A N/A Debridement Treatment N/A N/A N/A Response: Post Debridement N/A N/A N/A Measurements L x W x D (cm) Post Debridement Volume: N/A N/A N/A (cm) Periwound Skin Texture: Excoriation: No N/A N/A Induration: No Callus: No Crepitus: No Rash: No Scarring: No Periwound Skin Moisture: Maceration: No N/A N/A Dry/Scaly: No Periwound Skin Color: Atrophie Blanche: No N/A N/A Cyanosis: No Ecchymosis: No Erythema: No Hemosiderin Staining: No Mottled: No Pallor: No Rubor: No Temperature: No Abnormality N/A  N/A ARDON, FRANKLIN ([id number]) Tenderness on Palpation: Yes N/A N/A Wound Preparation: Ulcer Cleansing: N/A N/A Rinsed/Irrigated with Saline, Other: soap and water Topical Anesthetic Applied: Other: lidocaine 4% Procedures Performed: N/A N/A N/A Treatment Notes Wound #10 (Left, Distal, Anterior Lower Leg) 1. Cleansed with: Clean wound with Normal Saline Cleanse wound with antibacterial soap and water 2. Anesthetic Topical Lidocaine 4% cream to wound bed prior to debridement 3. Peri-wound Care: Barrier cream Moisturizing lotion 4. Dressing Applied: Other dressing (specify in notes) 5. Secondary Dressing Applied ABD Pad 7. Secured with Tape 3 Layer Compression System - Left Lower Extremity Notes silvercel Wound #7 (Left, Medial, Anterior Lower Leg) 1. Cleansed with: Clean wound with Normal Saline Cleanse wound with antibacterial soap and water 2. Anesthetic Topical Lidocaine 4% cream to wound bed prior to debridement 3. Peri-wound Care: Barrier cream Moisturizing lotion 4. Dressing Applied: Other dressing (specify in notes) 5. Secondary Dressing Applied ABD Pad 7. Secured with Tape 3 Layer Compression System - Left Lower Extremity Notes silvercel Wound #9 (Left, Proximal, Anterior Lower Leg) 1. Cleansed with: Clean wound with Normal Saline Cleanse wound with antibacterial soap and water DAWSEN, KRIEGER. ([id number]) 2. Anesthetic Topical Lidocaine 4% cream to wound bed prior to debridement 3. Peri-wound Care: Barrier cream Moisturizing lotion 4. Dressing Applied: Other dressing (specify in notes) 5. Secondary Dressing Applied ABD Pad 7. Secured with Tape 3 Layer Compression System - Left Lower Extremity Notes silvercel Electronic Signature(s) Signed: 07/31/2018 2:20:03 PM By: Bonnell Public Entered By: Bonnell Public on 07/31/2018 14:20:02 Victor Kemp  ([id number]) -------------------------------------------------------------------------------- Multi-Disciplinary Care Plan Details Patient Name: FREDERIK, STANDLEY. Date of Service: 07/31/2018 1:00 PM Medical Record Number: [id number] Patient Account Number: [id number] Date of Birth/Sex: [birth year] (72 y.o. M) Treating RN: Huel Coventry Primary Care Luiza Carranco: Duncan Dull Other Clinician: Referring Merlen Gurry: Duncan Dull Treating Jameel Quant/Extender: Kathreen Cosier in Treatment: 1 Active Inactive ` Abuse / Safety / Falls / Self Care Management Nursing Diagnoses: Self care deficit: actual or potential Goals: Patient/caregiver will verbalize understanding of skin care regimen Date Initiated: 07/24/2018 Target Resolution Date: 07/26/2018 Goal Status: Active Interventions: Podiatry chair, stretcher in low position and side rails up as needed Notes: ` Nutrition Nursing Diagnoses: Impaired glucose control: actual or potential Goals: Patient/caregiver verbalizes understanding of need to maintain therapeutic glucose control per primary care physician Date Initiated: 07/24/2018 Target Resolution Date: 07/26/2018 Goal Status: Active Interventions: Provide education on elevated blood sugars and impact on wound healing Treatment Activities: Patient referred to Primary Care Physician for further nutritional evaluation : 07/24/2018 Notes: ` Orientation to the Wound Care Program Nursing Diagnoses: Knowledge deficit related to the wound healing center program Goals: Patient/caregiver will verbalize understanding of the Wound Healing Center Program Date Initiated: 07/24/2018 Target Resolution Date: 07/26/2018 NIVIN, BRANIFF ([id number]) Goal Status: Active Interventions: Provide education on orientation to the wound center Notes: ` Venous Leg Ulcer Nursing Diagnoses: Actual venous Insuffiency (use after diagnosis is confirmed) Knowledge deficit related to disease process and  management Goals:  Patient/caregiver will verbalize understanding of disease process and disease management Date Initiated: 07/24/2018 Target Resolution Date: 07/26/2018 Goal Status: Active Interventions: Compression as ordered Treatment Activities: Non-invasive vascular studies : 07/24/2018 Notes: ` Wound/Skin Impairment Nursing Diagnoses: Impaired tissue integrity Goals: Ulcer/skin breakdown will have a volume reduction of 30% by week 4 Date Initiated: 07/24/2018 Target Resolution Date: 08/23/2018 Goal Status: Active Interventions: Assess ulceration(s) every visit Provide education on ulcer and skin care Treatment Activities: Referred to DME Jadasia Haws for dressing supplies : 07/24/2018 Notes: Electronic Signature(s) Signed: 07/31/2018 5:11:54 PM By: Elliot Gurney, BSN, RN, CWS, Kim RN, BSN Entered By: Elliot Gurney, BSN, RN, CWS, Kim on 07/31/2018 13:35:33 Victor Kemp ([id number]) -------------------------------------------------------------------------------- Pain Assessment Details Patient Name: STEFON, RAMTHUN. Date of Service: 07/31/2018 1:00 PM Medical Record Number: [id number] Patient Account Number: [id number] Date of Birth/Sex: 09-Feb-1946 (72 y.o. M) Treating RN: Curtis Sites Primary Care Minnetta Sandora: Duncan Dull Other Clinician: Referring Shameca Landen: Duncan Dull Treating Sianne Tejada/Extender: Kathreen Cosier in Treatment: 1 Active Problems Location of Pain Severity and Description of Pain Patient Has Paino No Site Locations Pain Management and Medication Current Pain Management: Electronic Signature(s) Signed: 07/31/2018 3:43:39 PM By: Curtis Sites Entered By: Curtis Sites on 07/31/2018 13:05:39 Victor Kemp ([id number]) -------------------------------------------------------------------------------- Patient/Caregiver Education Details Patient Name: ESHAAN, TITZER. Date of Service: 07/31/2018 1:00 PM Medical Record Number: [id number] Patient  Account Number: [id number] Date of Birth/Gender: [birth year] (72 y.o. M) Treating RN: Phillis Haggis Primary Care Physician: Duncan Dull Other Clinician: Referring Physician: Duncan Dull Treating Physician/Extender: Kathreen Cosier in Treatment: 1 Education Assessment Education Provided To: Patient Education Topics Provided Wound/Skin Impairment: Handouts: Caring for Your Ulcer, Skin Care Do's and Dont's, Other: change dressing as ordered Methods: Demonstration, Explain/Verbal Responses: State content correctly Electronic Signature(s) Signed: 08/01/2018 4:46:33 PM By: Alejandro Mulling Entered By: Alejandro Mulling on 07/31/2018 13:48:54 Victor Kemp ([id number]) -------------------------------------------------------------------------------- Wound Assessment Details Patient Name: Victor Kemp. Date of Service: 07/31/2018 1:00 PM Medical Record Number: [id number] Patient Account Number: [id number] Date of Birth/Sex: October 29, 1946 (72 y.o. M) Treating RN: Curtis Sites Primary Care Talley Kreiser: Duncan Dull Other Clinician: Referring Elmore Hyslop: Duncan Dull Treating Katana Berthold/Extender: Bonnell Public Weeks in Treatment: 1 Wound Status Wound Number: 10 Primary Lymphedema Etiology: Wound Location: Left Lower Leg - Anterior, Distal Wound Open Wounding Event: Blister Status: Date Acquired: 06/24/2018 Comorbid Cataracts, Anemia, Asthma, Chronic Obstructive Weeks Of Treatment: 1 History: Pulmonary Disease (COPD), Arrhythmia, Clustered Wound: No Coronary Artery Disease, Hypertension, Type II Diabetes, Osteoarthritis Photos Photo Uploaded By: Curtis Sites on 07/31/2018 15:00:46 Wound Measurements Length: (cm) 0.4 Width: (cm) 0.3 Depth: (cm) 0.1 Area: (cm) 0.094 Volume: (cm) 0.009 % Reduction in Area: 52% % Reduction in Volume: 55% Epithelialization: Medium (34-66%) Tunneling: No Undermining: No Wound Description Classification: Partial  Thickness Wound Margin: Flat and Intact Exudate Amount: Large Exudate Type: Serous Exudate Color: amber Foul Odor After Cleansing: No Slough/Fibrino Yes Wound Bed Granulation Amount: Large (67-100%) Exposed Structure Granulation Quality: Red, Pink Fascia Exposed: No Necrotic Amount: Small (1-33%) Fat Layer (Subcutaneous Tissue) Exposed: No Necrotic Quality: Adherent Slough Tendon Exposed: No Muscle Exposed: No Joint Exposed: No Bone Exposed: No KORDE, JEPPSEN R. ([id number]) Periwound Skin Texture Texture Color No Abnormalities Noted: No No Abnormalities Noted: No Callus: No Atrophie Blanche: No Crepitus: No Cyanosis: No Excoriation: No Ecchymosis: No Induration: No Erythema: No Rash: No Hemosiderin Staining: No Scarring: No Mottled: No Pallor: No Moisture Rubor: No No Abnormalities Noted: No Dry / Scaly: No Temperature / Pain Maceration: No Temperature: No Abnormality Tenderness on Palpation:  Yes Wound Preparation Ulcer Cleansing: Rinsed/Irrigated with Saline, Other: soap and water, Topical Anesthetic Applied: Other: lidocaine 4%, Treatment Notes Wound #10 (Left, Distal, Anterior Lower Leg) 1. Cleansed with: Clean wound with Normal Saline Cleanse wound with antibacterial soap and water 2. Anesthetic Topical Lidocaine 4% cream to wound bed prior to debridement 3. Peri-wound Care: Barrier cream Moisturizing lotion 4. Dressing Applied: Other dressing (specify in notes) 5. Secondary Dressing Applied ABD Pad 7. Secured with Tape 3 Layer Compression System - Left Lower Extremity Notes silvercel Electronic Signature(s) Signed: 07/31/2018 3:43:39 PM By: Curtis Sites Entered By: Curtis Sites on 07/31/2018 13:15:24 Victor Kemp ([id number]) -------------------------------------------------------------------------------- Wound Assessment Details Patient Name: HELIOS, KOHLMANN. Date of Service: 07/31/2018 1:00 PM Medical Record  Number: [id number] Patient Account Number: [id number] Date of Birth/Sex: 1946/09/09 (72 y.o. M) Treating RN: Curtis Sites Primary Care Dmarcus Decicco: Duncan Dull Other Clinician: Referring Deanda Ruddell: Duncan Dull Treating Caidyn Blossom/Extender: Bonnell Public Weeks in Treatment: 1 Wound Status Wound Number: 7 Primary Lymphedema Etiology: Wound Location: Left Lower Leg - Medial, Anterior Wound Open Wounding Event: Blister Status: Date Acquired: 06/24/2018 Comorbid Cataracts, Anemia, Asthma, Chronic Obstructive Weeks Of Treatment: 1 History: Pulmonary Disease (COPD), Arrhythmia, Clustered Wound: No Coronary Artery Disease, Hypertension, Type II Diabetes, Osteoarthritis Photos Photo Uploaded By: Curtis Sites on 07/31/2018 15:01:11 Wound Measurements Length: (cm) 7 Width: (cm) 5.4 Depth: (cm) 0.1 Area: (cm) 29.688 Volume: (cm) 2.969 % Reduction in Area: -11.7% % Reduction in Volume: -11.7% Epithelialization: Small (1-33%) Tunneling: No Undermining: No Wound Description Full Thickness Without Exposed Support Classification: Structures Wound Margin: Distinct, outline attached Exudate Large Amount: Exudate Type: Serous Exudate Color: amber Foul Odor After Cleansing: No Slough/Fibrino Yes Wound Bed Granulation Amount: Small (1-33%) Exposed Structure Granulation Quality: Red, Pink Fascia Exposed: No Necrotic Amount: Large (67-100%) Fat Layer (Subcutaneous Tissue) Exposed: No Necrotic Quality: Adherent Slough Tendon Exposed: No Muscle Exposed: No Joint Exposed: No BRONC, BROSSEAU R. ([id number]) Bone Exposed: No Periwound Skin Texture Texture Color No Abnormalities Noted: No No Abnormalities Noted: No Callus: No Atrophie Blanche: No Crepitus: No Cyanosis: No Excoriation: No Ecchymosis: No Induration: No Erythema: No Rash: No Hemosiderin Staining: No Scarring: No Mottled: No Pallor: No Moisture Rubor: No No Abnormalities Noted: No Dry / Scaly: Yes  Temperature / Pain Maceration: No Temperature: No Abnormality Tenderness on Palpation: Yes Wound Preparation Ulcer Cleansing: Rinsed/Irrigated with Saline, Other: soap and water, Topical Anesthetic Applied: Other: lidocaine 4%, Treatment Notes Wound #7 (Left, Medial, Anterior Lower Leg) 1. Cleansed with: Clean wound with Normal Saline Cleanse wound with antibacterial soap and water 2. Anesthetic Topical Lidocaine 4% cream to wound bed prior to debridement 3. Peri-wound Care: Barrier cream Moisturizing lotion 4. Dressing Applied: Other dressing (specify in notes) 5. Secondary Dressing Applied ABD Pad 7. Secured with Tape 3 Layer Compression System - Left Lower Extremity Notes silvercel Electronic Signature(s) Signed: 07/31/2018 3:43:39 PM By: Curtis Sites Entered By: Curtis Sites on 07/31/2018 13:15:40 Victor Kemp ([id number]) -------------------------------------------------------------------------------- Wound Assessment Details Patient Name: KYL, GIVLER. Date of Service: 07/31/2018 1:00 PM Medical Record Number: [id number] Patient Account Number: [id number] Date of Birth/Sex: February 19, 1946 (72 y.o. M) Treating RN: Curtis Sites Primary Care Zacharey Jensen: Duncan Dull Other Clinician: Referring Dagmar Adcox: Duncan Dull Treating Reegan Bouffard/Extender: Bonnell Public Weeks in Treatment: 1 Wound Status Wound Number: 8 Primary Etiology: Lymphedema Wound Location: Left, Medial, Posterior Lower Leg Wound Status: Healed - Epithelialized Wounding Event: Blister Date Acquired: 06/24/2018 Weeks Of Treatment: 1 Clustered Wound: No Photos Photo Uploaded By: Curtis Sites on 07/31/2018 15:01:12  Wound Measurements Length: (cm) 0 Width: (cm) 0 Depth: (cm) 0 Area: (cm) 0 Volume: (cm) 0 % Reduction in Area: 100% % Reduction in Volume: 100% Wound Description Full Thickness Without Exposed Support Classification: Structures Periwound Skin Texture Texture  Color No Abnormalities Noted: No No Abnormalities Noted: No Moisture No Abnormalities Noted: No Electronic Signature(s) Signed: 07/31/2018 3:43:39 PM By: Curtis Sites Entered By: Curtis Sites on 07/31/2018 13:15:09 Victor Kemp ([id number]) -------------------------------------------------------------------------------- Wound Assessment Details Patient Name: Victor Kemp. Date of Service: 07/31/2018 1:00 PM Medical Record Number: [id number] Patient Account Number: [id number] Date of Birth/Sex: [birth year] (72 y.o. M) Treating RN: Curtis Sites Primary Care Hazelene Doten: Duncan Dull Other Clinician: Referring Khamani Fairley: Duncan Dull Treating Anetria Harwick/Extender: Bonnell Public Weeks in Treatment: 1 Wound Status Wound Number: 9 Primary Lymphedema Etiology: Wound Location: Left Lower Leg - Anterior, Proximal Wound Open Wounding Event: Blister Status: Date Acquired: 06/24/2018 Comorbid Cataracts, Anemia, Asthma, Chronic Obstructive Weeks Of Treatment: 1 History: Pulmonary Disease (COPD), Arrhythmia, Clustered Wound: No Coronary Artery Disease, Hypertension, Type II Diabetes, Osteoarthritis Photos Photo Uploaded By: Curtis Sites on 07/31/2018 15:01:27 Wound Measurements Length: (cm) 1.2 Width: (cm) 0.9 Depth: (cm) 0.1 Area: (cm) 0.848 Volume: (cm) 0.085 % Reduction in Area: -120.3% % Reduction in Volume: -123.7% Epithelialization: None Tunneling: No Undermining: No Wound Description Classification: Partial Thickness Wound Margin: Flat and Intact Exudate Amount: Medium Exudate Type: Serous Exudate Color: amber Foul Odor After Cleansing: No Slough/Fibrino Yes Wound Bed Granulation Amount: Medium (34-66%) Exposed Structure Granulation Quality: Red, Pink Fascia Exposed: No Necrotic Amount: Medium (34-66%) Fat Layer (Subcutaneous Tissue) Exposed: No Necrotic Quality: Adherent Slough Tendon Exposed: No Muscle Exposed: No Joint Exposed: No Bone  Exposed: No DEANDRAE, WAJDA R. ([id number]) Periwound Skin Texture Texture Color No Abnormalities Noted: No No Abnormalities Noted: No Callus: No Atrophie Blanche: No Crepitus: No Cyanosis: No Excoriation: No Ecchymosis: No Induration: No Erythema: No Rash: No Hemosiderin Staining: No Scarring: No Mottled: No Pallor: No Moisture Rubor: No No Abnormalities Noted: No Dry / Scaly: No Temperature / Pain Maceration: No Temperature: No Abnormality Tenderness on Palpation: Yes Wound Preparation Ulcer Cleansing: Rinsed/Irrigated with Saline, Other: soap and water, Topical Anesthetic Applied: Other: lidocaine 4%, Treatment Notes Wound #9 (Left, Proximal, Anterior Lower Leg) 1. Cleansed with: Clean wound with Normal Saline Cleanse wound with antibacterial soap and water 2. Anesthetic Topical Lidocaine 4% cream to wound bed prior to debridement 3. Peri-wound Care: Barrier cream Moisturizing lotion 4. Dressing Applied: Other dressing (specify in notes) 5. Secondary Dressing Applied ABD Pad 7. Secured with Tape 3 Layer Compression System - Left Lower Extremity Notes silvercel Electronic Signature(s) Signed: 07/31/2018 3:43:39 PM By: Curtis Sites Entered By: Curtis Sites on 07/31/2018 13:15:53 Victor Kemp ([id number]) -------------------------------------------------------------------------------- Vitals Details Patient Name: Victor Kemp. Date of Service: 07/31/2018 1:00 PM Medical Record Number: [id number] Patient Account Number: [id number] Date of Birth/Sex: [birth year] (72 y.o. M) Treating RN: Curtis Sites Primary Care Dali Kraner: Duncan Dull Other Clinician: Referring Rokia Bosket: Duncan Dull Treating Jarquis Walker/Extender: Kathreen Cosier in Treatment: 1 Vital Signs Time Taken: 13:05 Temperature (F): 98.0 Height (in): 72 Pulse (bpm): 70 Weight (lbs): 293.4 Respiratory Rate (breaths/min): 18 Body Mass Index (BMI): 39.8 Blood  Pressure (mmHg): 131/66 Reference Range: 80 - 120 mg / dl Electronic Signature(s) Signed: 07/31/2018 3:43:39 PM By: Curtis Sites Entered By: Curtis Sites on 07/31/2018 13:06:47

## 2018-08-13 ENCOUNTER — Telehealth: Payer: Self-pay | Admitting: Radiology
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
## 2018-08-13 NOTE — Telephone Encounter (Signed)
LMOM to notify patient of pre-admission testing appointment scheduled 08/19/2018 at 9:00.

## 2018-08-13 NOTE — Telephone Encounter (Signed)
Patient confirmed pre-admit testing appointment.

## 2018-08-14 ENCOUNTER — Telehealth: Payer: Self-pay | Admitting: Urology

## 2018-08-14 ENCOUNTER — Encounter: Payer: Self-pay | Admitting: Cardiovascular Disease

## 2018-08-14 ENCOUNTER — Encounter: Payer: Medicare HMO | Admitting: Internal Medicine

## 2018-08-14 ENCOUNTER — Ambulatory Visit: Payer: Medicare HMO | Admitting: Cardiovascular Disease

## 2018-08-14 VITALS — BP 136/78 | HR 80 | Ht 72.0 in | Wt 289.0 lb

## 2018-08-14 DIAGNOSIS — I25118 Atherosclerotic heart disease of native coronary artery with other forms of angina pectoris: Secondary | ICD-10-CM | POA: Diagnosis not present

## 2018-08-14 DIAGNOSIS — L97822 Non-pressure chronic ulcer of other part of left lower leg with fat layer exposed: Secondary | ICD-10-CM | POA: Diagnosis not present

## 2018-08-14 DIAGNOSIS — J432 Centrilobular emphysema: Secondary | ICD-10-CM

## 2018-08-14 DIAGNOSIS — L97221 Non-pressure chronic ulcer of left calf limited to breakdown of skin: Secondary | ICD-10-CM | POA: Diagnosis not present

## 2018-08-14 DIAGNOSIS — I110 Hypertensive heart disease with heart failure: Secondary | ICD-10-CM | POA: Diagnosis not present

## 2018-08-14 DIAGNOSIS — E11622 Type 2 diabetes mellitus with other skin ulcer: Secondary | ICD-10-CM | POA: Diagnosis not present

## 2018-08-14 DIAGNOSIS — Z87898 Personal history of other specified conditions: Secondary | ICD-10-CM

## 2018-08-14 DIAGNOSIS — I890 Lymphedema, not elsewhere classified: Secondary | ICD-10-CM | POA: Diagnosis not present

## 2018-08-14 DIAGNOSIS — L97229 Non-pressure chronic ulcer of left calf with unspecified severity: Secondary | ICD-10-CM | POA: Diagnosis not present

## 2018-08-14 DIAGNOSIS — F1011 Alcohol abuse, in remission: Secondary | ICD-10-CM

## 2018-08-14 DIAGNOSIS — I5030 Unspecified diastolic (congestive) heart failure: Secondary | ICD-10-CM

## 2018-08-14 DIAGNOSIS — I509 Heart failure, unspecified: Secondary | ICD-10-CM | POA: Diagnosis not present

## 2018-08-14 DIAGNOSIS — I10 Essential (primary) hypertension: Secondary | ICD-10-CM | POA: Diagnosis not present

## 2018-08-14 DIAGNOSIS — J449 Chronic obstructive pulmonary disease, unspecified: Secondary | ICD-10-CM | POA: Diagnosis not present

## 2018-08-14 DIAGNOSIS — I87332 Chronic venous hypertension (idiopathic) with ulcer and inflammation of left lower extremity: Secondary | ICD-10-CM | POA: Diagnosis not present

## 2018-08-14 DIAGNOSIS — E782 Mixed hyperlipidemia: Secondary | ICD-10-CM | POA: Diagnosis not present

## 2018-08-14 DIAGNOSIS — G4733 Obstructive sleep apnea (adult) (pediatric): Secondary | ICD-10-CM | POA: Diagnosis not present

## 2018-08-14 DIAGNOSIS — I872 Venous insufficiency (chronic) (peripheral): Secondary | ICD-10-CM | POA: Diagnosis not present

## 2018-08-14 DIAGNOSIS — I2510 Atherosclerotic heart disease of native coronary artery without angina pectoris: Secondary | ICD-10-CM | POA: Diagnosis not present

## 2018-08-14 NOTE — Patient Instructions (Signed)

## 2018-08-14 NOTE — Telephone Encounter (Signed)
I left a message for the patient to call us if has any concerns or questions.

## 2018-08-19 ENCOUNTER — Other Ambulatory Visit: Payer: Self-pay

## 2018-08-19 ENCOUNTER — Encounter
Admission: RE | Admit: 2018-08-19 | Discharge: 2018-08-19 | Disposition: A | Discharge status: Home / Self Care | Payer: Medicare HMO | Source: Ambulatory Visit | Attending: Urology | Admitting: Urology

## 2018-08-19 DIAGNOSIS — Z01812 Encounter for preprocedural laboratory examination: Secondary | ICD-10-CM | POA: Diagnosis not present

## 2018-08-19 HISTORY — DX: Peripheral vascular disease, unspecified: I73.9

## 2018-08-19 HISTORY — DX: Prediabetes: R73.03

## 2018-08-19 NOTE — Pre-Procedure Instructions (Signed)
AS INSTRUCTED BY DR Lorin PicketM THOMAS, REQUEST FOR PUMONARY CLEARANCE CALLED AND [facility]ED TO [facility]. ALSO FA[facility]ED AND LM FOR [facility]

## 2018-08-19 NOTE — Patient Instructions (Addendum)
Your procedure is scheduled on: 08/27/18 Tues Report to Same Day Surgery 2nd floor medical mall [facility](Medical Mall Entrance-take elevator on left to 2nd floor.  Check in with surgery information desk.) To find out your arrival time please call [phone] between 1PM - 3PM on 08/23/18 Fri  Remember: Instructions that are not followed completely may result in serious medical risk, up to and including death, or upon the discretion of your surgeon and anesthesiologist your surgery may need to be rescheduled.    _x___ 1. Do not eat food after midnight the night before your procedure. You may drink clear liquids up to 2 hours before you are scheduled to arrive at the hospital for your procedure.  Do not drink clear liquids within 2 hours of your scheduled arrival to the hospital.  Clear liquids include  --Water or Apple juice without pulp  --Clear carbohydrate beverage such as ClearFast or Gatorade  --Black Coffee or Clear Tea (No milk, no creamers, do not add anything to                  the coffee or Tea Type 1 and type 2 diabetics should only drink water.   ____Ensure clear carbohydrate drink on the way to the hospital for bariatric patients  ____Ensure clear carbohydrate drink 3 hours before surgery for Dr Rutherford NailByrnett's patients if physician instructed.   No gum chewing or hard candies.     __x__ 2. No Alcohol for 24 hours before or after surgery.   __x__3. No Smoking or e-cigarettes for 24 prior to surgery.  Do not use any chewable tobacco products for at least 6 hour prior to surgery   ____  4. Bring all medications with you on the day of surgery if instructed.    __x__ 5. Notify your doctor if there is any change in your medical condition     (cold, fever, infections).    x___6. On the morning of surgery brush your teeth with toothpaste and water.  You may rinse your mouth with mouth wash if you wish.  Do not swallow any toothpaste or mouthwash.   Do not wear jewelry, make-up, hairpins,  clips or nail polish.  Do not wear lotions, powders, or perfumes. You may wear deodorant.  Do not shave 48 hours prior to surgery. Men may shave face and neck.  Do not bring valuables to the hospital.    [facility] not responsible for any belongings or valuables.               Contacts, dentures or bridgework may not be worn into surgery.  Leave your suitcase in the car. After surgery it may be brought to your room.  For patients admitted to the hospital, discharge time is determined by your                       treatment team.  _  Patients discharged the day of surgery will not be allowed to drive home.  You will need someone to drive you home and stay with you the night of your procedure.    Please read over the following fact sheets that you were given:   [facility] Preparing [facility] and or MRSA Information   _x___ Take anti-hypertensive listed below, cardiac, seizure, asthma,     anti-reflux and psychiatric medicines. These include:  1. albuterol (PROVENTIL HFA;VENTOLIN HFA) 108 (90 Base) MCG/ACT inhaler  2.amLODipine (NORVASC) 5 MG tablet  3.arformoterol (BROVANA) 15  MCG/2ML NEBU  4.budesonide (PULMICORT) 0.25 MG/2ML nebulizer solution  5.carvedilol (COREG) 12.5 MG tablet  6.ipratropium (ATROVENT) 0.03 % nasal spray   ____Fleets enema or Magnesium Citrate as directed.   _x___ Use CHG Soap or sage wipes as directed on instruction sheet   _x___ Use inhalers on the day of surgery and bring to hospital day of surgery  ____ Stop Metformin and Janumet 2 days prior to surgery.    ____ Take 1/2 of usual insulin dose the night before surgery and none on the morning     surgery.   _x___ Follow recommendations from Cardiologist, Pulmonologist or PCP regarding          stopping Aspirin, Coumadin, Plavix ,Eliquis, Effient, or Pradaxa, and Pletal. Contiune Aspririn per Dr Windell Hummingbird instructions.  X____Stop Anti-inflammatories such as Advil, Aleve, Ibuprofen, Motrin,  Naproxen, Naprosyn, Goodies powders or aspirin products. OK to take Tylenol and                          Celebrex.   _x___ Stop supplements until after surgery.  But may continue Vitamin D, Vitamin B,       and multivitamin.   ____ Bring C-Pap to the hospital.

## 2018-08-20 NOTE — Pre-Procedure Instructions (Signed)
PATIENT CLEARED BY DR Sharol HarnessSIMMONS AND ON CHART. HIGH RISK FOR LOW RISK SURGERY.NO LONGER SEES DR Milta DeitersS KHAN

## 2018-08-21 ENCOUNTER — Encounter: Payer: Medicare HMO | Admitting: Internal Medicine

## 2018-08-21 DIAGNOSIS — I87332 Chronic venous hypertension (idiopathic) with ulcer and inflammation of left lower extremity: Secondary | ICD-10-CM | POA: Diagnosis not present

## 2018-08-21 DIAGNOSIS — L97229 Non-pressure chronic ulcer of left calf with unspecified severity: Secondary | ICD-10-CM | POA: Diagnosis not present

## 2018-08-21 DIAGNOSIS — I110 Hypertensive heart disease with heart failure: Secondary | ICD-10-CM | POA: Diagnosis not present

## 2018-08-21 DIAGNOSIS — I890 Lymphedema, not elsewhere classified: Secondary | ICD-10-CM | POA: Diagnosis not present

## 2018-08-21 DIAGNOSIS — J449 Chronic obstructive pulmonary disease, unspecified: Secondary | ICD-10-CM | POA: Diagnosis not present

## 2018-08-21 DIAGNOSIS — I509 Heart failure, unspecified: Secondary | ICD-10-CM | POA: Diagnosis not present

## 2018-08-21 DIAGNOSIS — I872 Venous insufficiency (chronic) (peripheral): Secondary | ICD-10-CM | POA: Diagnosis not present

## 2018-08-21 DIAGNOSIS — E11622 Type 2 diabetes mellitus with other skin ulcer: Secondary | ICD-10-CM | POA: Diagnosis not present

## 2018-08-21 DIAGNOSIS — I2510 Atherosclerotic heart disease of native coronary artery without angina pectoris: Secondary | ICD-10-CM | POA: Diagnosis not present

## 2018-08-21 DIAGNOSIS — L97221 Non-pressure chronic ulcer of left calf limited to breakdown of skin: Secondary | ICD-10-CM | POA: Diagnosis not present

## 2018-08-21 NOTE — Progress Notes (Signed)
Victor Daniels, Victor Daniels ([id number]) Visit Report for 08/07/2018 Chief Complaint Document Details Patient Name: Victor Daniels, Victor Daniels. Date of Service: 08/07/2018 12:30 PM Medical Record Number: [id number] Patient Account Number: [id number] Date of Birth/Sex: [birth year] (72 y.o. M) Treating RN: Huel Coventry Primary Care Provider: Duncan Dull Other Clinician: Referring Provider: Duncan Dull Treating Provider/Extender: Bonnell Public Weeks in Treatment: 2 Information Obtained from: Patient Chief Complaint LLE Electronic Signature(s) Signed: 08/07/2018 1:16:00 PM By: Bonnell Public Entered By: Bonnell Public on 08/07/2018 13:16:00 Victor Daniels ([id number]) -------------------------------------------------------------------------------- HPI Details Patient Name: Victor Daniels. Date of Service: 08/07/2018 12:30 PM Medical Record Number: [id number] Patient Account Number: [id number] Date of Birth/Sex: [birth year] (72 y.o. M) Treating RN: Huel Coventry Primary Care Provider: Duncan Dull Other Clinician: Referring Provider: Duncan Dull Treating Provider/Extender: Kathreen Cosier in Treatment: 2 History of Present Illness Location: Patient has bilateral venous stasis with ulcerations with inflammation of the left leg Severity: The wounds are very clean and had been improving and measured smaller with silver collagen Duration: The patient has had problems with venous ulcers bilaterally from time to time for the last couple of years HPI Description: 07/24/18 This is a now 72 year old man who we have had in this clinic 3 times in the past. In 2015 he was here for left leg ulcer felt to be secondary to chronic venous insufficiency. In September 2017 here for bilateral lower extremity wounds again felt to be secondary to chronic venous insufficiency. He was discharged on this Occasion with 2030 mm below-knee stockings although there was a comment that he would probably need  more than that perhaps 30-40 below-knee stockings. The last time he was here was from 10/17/17 through 10/31/17. He had a traumatic wound on his left great toe that closed. I had ordered arterial studies on him at that point although it doesn't look like these were ever done. Currently the patient says about 6 weeks ago he started developing more swelling in the left leg. The left leg is chronically more swollen than the right but he said the swelling got to the point where his foot and ankle were tight. He then developed a large blistero Hematoma on the left anterior tibial area with 2 other small wounds anteriorly and one posteriorly. He is only been putting gauze on these. He has very old 20-30 mm below-knee stockings but I doubt he is getting enough compression from these. Past medical history is remarkable for COPD on what appears to be when necessary oxygen, obstructive sleep apnea [has not tolerated CPAP], type 2 diabetes on diet control with a recent hemoglobin A1c of 6.4. also hypertension, coronary artery disease and congestive heart failure. The patient tells me he had a CT scan of his chest 6-8 weeks ago to follow-up for a left upper lobe pulmonary nodule. ABIs were noncompressible in this clinic bilaterally 07/31/18-He is seen in follow-up evaluation for left lateral lower leg ulceration. He is tolerating compression therapy. He has been submitted for new compression stockings. His current compression stockings appear to be low-grade (8-12, 15-20 mmHg), he also admits using these improperly applying at bedtime and removing in the morning. He has been reeducated on appropriate compression stocking where. We will continue with same treatment plan he will follow-up next week. He has also been encouraged to maintain compression, not to remove prior to follow up appointment 08/07/18-He is seen in follow-up evaluation for left lower extremity ulcers. There is significant improvement and we  will continue with same treatment plan. He did  obtain to repair of 20-30 mmHg compression stockings from elastic therapy but states that he cannot put these on and as been wearing his old stockings to the right leg. He will be ordering stockings through [facility]. Electronic Signature(s) Signed: 08/07/2018 1:17:19 PM By: Bonnell Public Entered By: Bonnell Public on 08/07/2018 13:17:19 Victor Daniels ([id number]) -------------------------------------------------------------------------------- Physician Orders Details Patient Name: Victor Daniels, Victor Daniels. Date of Service: 08/07/2018 12:30 PM Medical Record Number: [id number] Patient Account Number: [id number] Date of Birth/Sex: 26-Jan-1946 (72 y.o. M) Treating RN: Huel Coventry Primary Care Provider: Duncan Dull Other Clinician: Referring Provider: Duncan Dull Treating Provider/Extender: Kathreen Cosier in Treatment: 2 Verbal / Phone Orders: No Diagnosis Coding Wound Cleansing Wound #7 Left,Medial,Anterior Lower Leg o Cleanse wound with mild soap and water Anesthetic (add to Medication List) Wound #7 Left,Medial,Anterior Lower Leg o Topical Lidocaine 4% cream applied to wound bed prior to debridement (In Clinic Only). Skin Barriers/Peri-Wound Care Wound #7 Left,Medial,Anterior Lower Leg o Barrier cream Primary Wound Dressing Wound #7 Left,Medial,Anterior Lower Leg o Silver Alginate Secondary Dressing Wound #7 Left,Medial,Anterior Lower Leg o Dry Gauze Dressing Change Frequency Wound #7 Left,Medial,Anterior Lower Leg o Other: - Twice weekly Follow-up Appointments Wound #7 Left,Medial,Anterior Lower Leg o Return Appointment in 1 week. o Nurse Visit as needed Edema Control Wound #7 Left,Medial,Anterior Lower Leg o 3 Layer Compression System - Left Lower Extremity Additional Orders / Instructions Wound #7 Left,Medial,Anterior Lower Leg o Other: - ETI Information for 20-80mmhg stockings (50cm  to knee, 30cm ankle, 47cm calf) Electronic Signature(s) Signed: 08/07/2018 5:10:54 PM By: Wynn Banker ([id number]) Signed: 08/07/2018 5:18:50 PM By: Elliot Gurney, BSN, RN, CWS, Kim RN, BSN Entered By: Elliot Gurney, BSN, RN, CWS, Kim on 08/07/2018 13:13:06 Victor Daniels, Victor Daniels ([id number]) -------------------------------------------------------------------------------- Problem List Details Patient Name: Victor Daniels, Victor Daniels. Date of Service: 08/07/2018 12:30 PM Medical Record Number: [id number] Patient Account Number: [id number] Date of Birth/Sex: May 09, 1946 (72 y.o. M) Treating RN: Huel Coventry Primary Care Provider: Duncan Dull Other Clinician: Referring Provider: Duncan Dull Treating Provider/Extender: Kathreen Cosier in Treatment: 2 Active Problems ICD-10 Evaluated Encounter Code Description Active Date Today Diagnosis L97.221 Non-pressure chronic ulcer of left calf limited to breakdown of 07/24/2018 No Yes skin I87.332 Chronic venous hypertension (idiopathic) with ulcer and 07/24/2018 No Yes inflammation of left lower extremity I89.0 Lymphedema, not elsewhere classified 07/24/2018 No Yes E11.622 Type 2 diabetes mellitus with other skin ulcer 07/24/2018 No Yes Inactive Problems Resolved Problems Electronic Signature(s) Signed: 08/07/2018 1:14:56 PM By: Bonnell Public Entered By: Bonnell Public on 08/07/2018 13:14:55 Victor Daniels ([id number]) -------------------------------------------------------------------------------- Progress Note Details Patient Name: Victor Daniels. Date of Service: 08/07/2018 12:30 PM Medical Record Number: [id number] Patient Account Number: [id number] Date of Birth/Sex: [birth year] (72 y.o. M) Treating RN: Huel Coventry Primary Care Provider: Duncan Dull Other Clinician: Referring Provider: Duncan Dull Treating Provider/Extender: Kathreen Cosier in Treatment: 2 Subjective Chief Complaint Information obtained  from Patient LLE History of Present Illness (HPI) The following HPI elements were documented for the patient's wound: Location: Patient has bilateral venous stasis with ulcerations with inflammation of the left leg Severity: The wounds are very clean and had been improving and measured smaller with silver collagen Duration: The patient has had problems with venous ulcers bilaterally from time to time for the last couple of years 07/24/18 This is a now 72 year old man who we have had in this clinic 3 times in the past. In 2015 he was here for left leg ulcer felt to be secondary to  chronic venous insufficiency. In September 2017 here for bilateral lower extremity wounds again felt to be secondary to chronic venous insufficiency. He was discharged on this Occasion with 2030 mm below-knee stockings although there was a comment that he would probably need more than that perhaps 30-40 below-knee stockings. The last time he was here was from 10/17/17 through 10/31/17. He had a traumatic wound on his left great toe that closed. I had ordered arterial studies on him at that point although it doesn't look like these were ever done. Currently the patient says about 6 weeks ago he started developing more swelling in the left leg. The left leg is chronically more swollen than the right but he said the swelling got to the point where his foot and ankle were tight. He then developed a large blistero Hematoma on the left anterior tibial area with 2 other small wounds anteriorly and one posteriorly. He is only been putting gauze on these. He has very old 20-30 mm below-knee stockings but I doubt he is getting enough compression from these. Past medical history is remarkable for COPD on what appears to be when necessary oxygen, obstructive sleep apnea [has not tolerated CPAP], type 2 diabetes on diet control with a recent hemoglobin A1c of 6.4. also hypertension, coronary artery disease and congestive heart  failure. The patient tells me he had a CT scan of his chest 6-8 weeks ago to follow-up for a left upper lobe pulmonary nodule. ABIs were noncompressible in this clinic bilaterally 07/31/18-He is seen in follow-up evaluation for left lateral lower leg ulceration. He is tolerating compression therapy. He has been submitted for new compression stockings. His current compression stockings appear to be low-grade (8-12, 15-20 mmHg), he also admits using these improperly applying at bedtime and removing in the morning. He has been reeducated on appropriate compression stocking where. We will continue with same treatment plan he will follow-up next week. He has also been encouraged to maintain compression, not to remove prior to follow up appointment 08/07/18-He is seen in follow-up evaluation for left lower extremity ulcers. There is significant improvement and we will continue with same treatment plan. He did obtain to repair of 20-30 mmHg compression stockings from elastic therapy but states that he cannot put these on and as been wearing his old stockings to the right leg. [facility]. Victor KempKIRCHGESSNER, Victor R. ([id number]) Objective Constitutional Vitals Time Taken: 12:43 PM, Height: 72 in, Weight: 293.4 lbs, BMI: 39.8, Temperature: 98.3 F, Pulse: 72 bpm, Respiratory Rate: 18 breaths/min, Blood Pressure: 133/70 mmHg. Integumentary (Hair, Skin) Wound #10 status is Healed - Epithelialized. Original cause of wound was Blister. [facility],Anterior Lower Leg. The wound measures 0cm length x 0cm width x 0cm depth; 0cm^2 area and 0cm^3 volume. Wound #11 status is Open. Original cause of wound was Gradually Appeared. The wound is located on the Left,Posterior Lower Leg. The wound measures 0.2cm length x 0.2cm width x 0.1cm depth; 0.031cm^2 area and 0.003cm^3 volume. There is no tunneling or undermining noted. There is a medium amount of  sanguinous drainage noted. The wound margin is flat and intact. There is large (67-100%) red granulation within the wound bed. There is no necrotic tissue within the wound bed. Periwound temperature was noted as No Abnormality. Wound #7 status is Open. Original cause of wound was Blister. The wound is located on the Left,Medial,Anterior Lower Leg. The wound measures 6cm length x 4cm width x 0.1cm depth;  18.85cm^2 area and 1.885cm^3 volume. There is no tunneling or undermining noted. There is a large amount of purulent drainage noted. The wound margin is distinct with the outline attached to the wound base. There is medium (34-66%) red, pink granulation within the wound bed. There is a medium (34- 66%) amount of necrotic tissue within the wound bed including Adherent Slough. The periwound skin appearance did not exhibit: Callus, Crepitus, Excoriation, Induration, Rash, Scarring, Dry/Scaly, Maceration, Atrophie Blanche, Cyanosis, Ecchymosis, Hemosiderin Staining, Mottled, Pallor, Rubor, Erythema. Periwound temperature was noted as No Abnormality. The periwound has tenderness on palpation. Wound #9 status is Healed - Epithelialized. Original cause of wound was Blister. The wound is located on the Left,Proximal,Anterior Lower Leg. The wound measures 0cm length x 0cm width x 0cm depth; 0cm^2 area and 0cm^3 volume. Assessment Active Problems ICD-10 Non-pressure chronic ulcer of left calf limited to breakdown of skin Chronic venous hypertension (idiopathic) with ulcer and inflammation of left lower extremity Lymphedema, not elsewhere classified Type 2 diabetes mellitus with other skin ulcer Plan Wound Cleansing: Wound #7 Left,Medial,Anterior Lower Leg: Cleanse wound with mild soap and water Anesthetic (add to Medication List): Wound #7 Left,Medial,Anterior Lower Leg: Victor Daniels, Victor Daniels ([id number]) Topical Lidocaine 4% cream applied to wound bed prior to debridement (In Clinic Only). Skin  Barriers/Peri-Wound Care: Wound #7 Left,Medial,Anterior Lower Leg: Barrier cream Primary Wound Dressing: Wound #7 Left,Medial,Anterior Lower Leg: Silver Alginate Secondary Dressing: Wound #7 Left,Medial,Anterior Lower Leg: Dry Gauze Dressing Change Frequency: Wound #7 Left,Medial,Anterior Lower Leg: Other: - Twice weekly Follow-up Appointments: Wound #7 Left,Medial,Anterior Lower Leg: Return Appointment in 1 week. Nurse Visit as needed Edema Control: Wound #7 Left,Medial,Anterior Lower Leg: 3 Layer Compression System - Left Lower Extremity Additional Orders / Instructions: Wound #7 Left,Medial,Anterior Lower Leg: Other: - ETI Information for 20-1mmhg stockings (50cm to knee, 30cm ankle, 47cm calf) Electronic Signature(s) Signed: 08/07/2018 1:17:39 PM By: Bonnell Public Entered By: Bonnell Public on 08/07/2018 13:17:38 Victor Daniels ([id number]) -------------------------------------------------------------------------------- SuperBill Details Patient Name: Victor Daniels. Date of Service: 08/07/2018 Medical Record Number: [id number] Patient Account Number: [id number] Date of Birth/Sex: [birth year] (72 y.o. M) Treating RN: Huel Coventry Primary Care Provider: Duncan Dull Other Clinician: Referring Provider: Duncan Dull Treating Provider/Extender: Kathreen Cosier in Treatment: 2 Diagnosis Coding ICD-10 Codes Code Description [phone] Non-pressure chronic ulcer of left calf limited to breakdown of skin I87.332 Chronic venous hypertension (idiopathic) with ulcer and inflammation of left lower extremity I89.0 Lymphedema, not elsewhere classified E11.622 Type 2 diabetes mellitus with other skin ulcer Facility Procedures CPT4 Code: [id number] Description: (Facility Use Only) [phone] - APPLY MULTLAY COMPRS LWR LT LEG Modifier: Quantity: 1 Physician Procedures CPT4: Description Modifier Quantity Code [id number] - WC PHYS LEVEL 3 - EST PT 1 ICD-10 Diagnosis  Description L97.221 Non-pressure chronic ulcer of left calf limited to breakdown of skin I87.332 Chronic venous hypertension (idiopathic) with ulcer and  inflammation of left lower extremity Electronic Signature(s) Signed: 08/07/2018 1:18:08 PM By: Bonnell Public Entered By: Bonnell Public on 08/07/2018 13:18:07

## 2018-08-22 ENCOUNTER — Telehealth: Payer: Self-pay | Admitting: Internal Medicine

## 2018-08-22 MED ORDER — IPRATROPIUM BROMIDE 0.03 % NA SOLN: 2.0000 | Freq: Two times a day (BID) | NASAL | 0 refills | Status: DC

## 2018-08-22 NOTE — Telephone Encounter (Signed)
Copied from CRM [id number]. Topic: Quick Communication - See Telephone Encounter >> Aug 22, 2018  3:17 PM Waymon AmatoBurton, Donna F wrote: Pt is needing a refill on ipratropium  [facility]   Best number [phone]

## 2018-08-22 NOTE — Progress Notes (Signed)
Victor Daniels, Stanislav R. ([id number]) Visit Report for 08/07/2018 Arrival Information Details Patient Name: Victor Daniels, Victor R. Date of Service: 08/07/2018 12:30 PM Medical Record Number: [id number] Patient Account Number: [id number] Date of Birth/Sex: 1946/09/12 [age] y.o. M) Treating RN: Phillis HaggisPinkerton, Debi Primary Care Shigeo Baugh: Duncan Dullullo, Teresa Other Clinician: Referring Jkai Arwood: Duncan Dullullo, Teresa Treating Pawan Knechtel/Extender: Kathreen Cosieroulter, Leah Weeks in Treatment: 2 Visit Information History Since Last Visit All ordered tests and consults were completed: No Patient Arrived: Ambulatory Added or deleted any medications: No Arrival Time: 12:40 Any new allergies or adverse reactions: No Accompanied By: self Had a fall or experienced change in No Transfer Assistance: None activities of daily living that may affect Patient Identification Verified: Yes risk of falls: Secondary Verification Process Yes Signs or symptoms of abuse/neglect since last visito No Completed: Hospitalized since last visit: No Patient Requires Transmission-Based No Implantable device outside of the clinic excluding No Precautions: cellular tissue based products placed in the center Patient Has Alerts: Yes since last visit: Patient Alerts: [place] bilateral Has Dressing in Place as Prescribed: Yes 07/24/2018 Has Compression in Place as Prescribed: Yes Pain Present Now: No Electronic Signature(s) Signed: 08/12/2018 5:34:19 PM By: Alejandro MullingPinkerton, Debra Entered By: Alejandro MullingPinkerton, Debra on 08/07/2018 12:41:21 Victor Daniels, Brennden R. ([id number]) -------------------------------------------------------------------------------- Encounter Discharge Information Details Patient Name: Victor Daniels, Victor R. Date of Service: 08/07/2018 12:30 PM Medical Record Number: [id number] Patient Account Number: [id number] Date of Birth/Sex: 1946/09/12 [age] y.o. M) Treating RN: Curtis Sitesorthy, Joanna Primary Care Dalessandro Baldyga: Duncan Dullullo, Teresa Other Clinician: Referring  Kaspian Muccio: Duncan Dullullo, Teresa Treating Reegan Bouffard/Extender: Kathreen Cosieroulter, Leah Weeks in Treatment: 2 Encounter Discharge Information Items Discharge Condition: Stable Ambulatory Status: Ambulatory Discharge Destination: Home Transportation: Private Auto Accompanied By: self Schedule Follow-up Appointment: Yes Clinical Summary of Care: Electronic Signature(s) Signed: 08/07/2018 2:55:01 PM By: Curtis Sitesorthy, Joanna Entered By: Curtis Sitesorthy, Joanna on 08/07/2018 14:55:01 Victor Daniels, Autumn R. ([id number]) -------------------------------------------------------------------------------- Lower Extremity Assessment Details Patient Name: Victor Daniels, Victor R. Date of Service: 08/07/2018 12:30 PM Medical Record Number: [id number] Patient Account Number: [id number] Date of Birth/Sex: 1946/09/12 [age] y.o. M) Treating RN: Phillis HaggisPinkerton, Debi Primary Care Jimmy Stipes: Duncan Dullullo, Teresa Other Clinician: Referring Fawn Desrocher: Duncan Dullullo, Teresa Treating Aolani Piggott/Extender: Bonnell Publicoulter, Leah Weeks in Treatment: 2 Edema Assessment Assessed: [Left: No] [Right: No] [Left: Edema] [Right: :] Calf Left: Right: Point of Measurement: 33 cm From Medial Instep 47.3 cm cm Ankle Left: Right: Point of Measurement: 10 cm From Medial Instep 29.2 cm cm Vascular Assessment Pulses: Dorsalis Pedis Palpable: [Left:Yes] Posterior Tibial Extremity colors, hair growth, and conditions: Extremity Color: [Left:Hyperpigmented] Temperature of Extremity: [Left:Warm] Capillary Refill: [Left:< 3 seconds] Toe Nail Assessment Left: Right: Thick: Yes Discolored: Yes Deformed: Yes Improper Length and Hygiene: Yes Electronic Signature(s) Signed: 08/12/2018 5:34:19 PM By: Alejandro MullingPinkerton, Debra Entered By: Alejandro MullingPinkerton, Debra on 08/07/2018 12:47:18 Victor Daniels, Nickolai R. ([id number]) -------------------------------------------------------------------------------- Multi Wound Chart Details Patient Name: Victor Daniels, Victor R. Date of Service: 08/07/2018 12:30 PM Medical  Record Number: [id number] Patient Account Number: [id number] Date of Birth/Sex: 1946/09/12 [age] y.o. M) Treating RN: Huel CoventryWoody, Kim Primary Care Aydian Dimmick: Duncan Dullullo, Teresa Other Clinician: Referring Freya Zobrist: Duncan Dullullo, Teresa Treating Korban Shearer/Extender: Kathreen Cosieroulter, Leah Weeks in Treatment: 2 Vital Signs Height(in): 72 Pulse(bpm): 72 Weight(lbs): 293.4 Blood Pressure(mmHg): 133/70 Body Mass Index(BMI): 40 Temperature(F): 98.3 Respiratory Rate 18 (breaths/min): Photos: [10:No Photos] [11:No Photos] [7:No Photos] Wound Location: [10:Left, Distal, Anterior Lower Leg Left Lower Leg - Posterior] [7:Left Lower Leg - Medial, Anterior] Wounding Event: [10:Blister] [11:Gradually Appeared] [7:Blister] Primary Etiology: [10:Lymphedema] [11:Lymphedema] [7:Lymphedema] Comorbid History: [10:N/A] [11:Cataracts, Anemia, Asthma, Cataracts, Anemia, Asthma, Chronic Obstructive Pulmonary Disease (COPD), Pulmonary Disease (COPD), Arrhythmia, Coronary  Artery Arrhythmia, Coronary Artery Disease, Hypertension, Type II Disease,  Hypertension, Type II Diabetes, Osteoarthritis] [7:Chronic Obstructive Diabetes, Osteoarthritis] Date Acquired: [10:06/24/2018] [11:08/07/2018] [7:06/24/2018] Weeks of Treatment: [10:2] [11:0] [7:2] Wound Status: [10:Healed - Epithelialized] [11:Open] [7:Open] Measurements L x W x D [10:0x0x0] [11:0.2x0.2x0.1] [7:6x4x0.1] (cm) Area (cm) : [10:0] [11:0.031] [7:18.85] Volume (cm) : [10:0] [11:0.003] [7:1.885] % Reduction in Area: [10:100.00%] [11:N/A] [7:29.10%] % Reduction in Volume: [10:100.00%] [11:N/A] [7:29.10%] Classification: [10:Partial Thickness] [11:Full Thickness Without Exposed Support Structures] [7:Full Thickness Without Exposed Support Structures] Exudate Amount: [10:N/A] [11:Medium] [7:Large] Exudate Type: [10:N/A] [11:Sanguinous] [7:Purulent] Exudate Color: [10:N/A] [11:red] [7:yellow, brown, green] Wound Margin: [10:N/A] [11:Flat and Intact] [7:Distinct, outline  attached] Granulation Amount: [10:N/A] [11:Large (67-100%)] [7:Medium (34-66%)] Granulation Quality: [10:N/A] [11:Red] [7:Red, Pink] Necrotic Amount: [10:N/A] [11:None Present (0%)] [7:Medium (34-66%)] Epithelialization: [10:N/A] [11:None] [7:Small (1-33%)] Periwound Skin Texture: [10:No Abnormalities Noted] [11:No Abnormalities Noted] [7:Excoriation: No Induration: No Callus: No Crepitus: No Rash: No Scarring: No] Periwound Skin Moisture: [10:No Abnormalities Noted] [11:No Abnormalities Noted] Maceration: No Dry/Scaly: No Periwound Skin Color: No Abnormalities Noted No Abnormalities Noted Atrophie Blanche: No Cyanosis: No Ecchymosis: No Erythema: No Hemosiderin Staining: No Mottled: No Pallor: No Rubor: No Temperature: N/A No Abnormality No Abnormality Tenderness on Palpation: No No Yes Wound Preparation: N/A Ulcer Cleansing: Ulcer Cleansing: Rinsed/Irrigated with Saline Rinsed/Irrigated with Saline, Other: soap and water Topical Anesthetic Applied: Other: lidocaine 4% Topical Anesthetic Applied: Other: lidocaine 4% Wound Number: 9 N/A N/A Photos: No Photos N/A N/A Wound Location: Left, Proximal, Anterior Lower N/A N/A Leg Wounding Event: Blister N/A N/A Primary Etiology: Lymphedema N/A N/A Comorbid History: N/A N/A N/A Date Acquired: 06/24/2018 N/A N/A Weeks of Treatment: 2 N/A N/A Wound Status: Healed - Epithelialized N/A N/A Measurements L x W x D 0x0x0 N/A N/A (cm) Area (cm) : 0 N/A N/A Volume (cm) : 0 N/A N/A % Reduction in Area: 100.00% N/A N/A % Reduction in Volume: 100.00% N/A N/A Classification: Partial Thickness N/A N/A Exudate Amount: N/A N/A N/A Exudate Type: N/A N/A N/A Exudate Color: N/A N/A N/A Wound Margin: N/A N/A N/A Granulation Amount: N/A N/A N/A Granulation Quality: N/A N/A N/A Necrotic Amount: N/A N/A N/A Exposed Structures: N/A N/A N/A Epithelialization: N/A N/A N/A Periwound Skin Texture: No Abnormalities Noted N/A N/A Periwound Skin  Moisture: No Abnormalities Noted N/A N/A Periwound Skin Color: No Abnormalities Noted N/A N/A Temperature: N/A N/A N/A Tenderness on Palpation: No N/A N/A Wound Preparation: N/A N/A N/A Treatment Notes Electronic Signature(s) Signed: 08/07/2018 1:15:45 PM By: Gertie Baron, Martin R. ([id number]) Entered By: Bonnell Public on 08/07/2018 13:15:45 Victor Daniels ([id number]) -------------------------------------------------------------------------------- Multi-Disciplinary Care Plan Details Patient Name: ALMIN, LIVINGSTONE. Date of Service: 08/07/2018 12:30 PM Medical Record Number: [id number] Patient Account Number: [id number] Date of Birth/Sex: 11-Apr-1946 (72 y.o. M) Treating RN: Huel Coventry Primary Care [place] Crance: Duncan Dull Other Clinician: Referring Peaches Vanoverbeke: Duncan Dull Treating Kolby Schara/Extender: Kathreen Cosier in Treatment: 2 Active Inactive ` Abuse / Safety / Falls / Self Care Management Nursing Diagnoses: Self care deficit: actual or potential Goals: Patient/caregiver will verbalize understanding of skin care regimen Date Initiated: 07/24/2018 Target Resolution Date: 07/26/2018 Goal Status: Active Interventions: Podiatry chair, stretcher in low position and side rails up as needed Notes: ` Nutrition Nursing Diagnoses: Impaired glucose control: actual or potential Goals: Patient/caregiver verbalizes understanding of need to maintain therapeutic glucose control per primary care physician Date Initiated: 07/24/2018 Target Resolution Date: 07/26/2018 Goal Status: Active Interventions: Provide education on elevated blood sugars and impact on wound healing  Treatment Activities: Patient referred to Primary Care Physician for further nutritional evaluation : 07/24/2018 Notes: ` Orientation to the Wound Care Program Nursing Diagnoses: Knowledge deficit related to the wound healing center program Goals: Patient/caregiver will verbalize  understanding of the Wound Healing Center Program Date Initiated: 07/24/2018 Target Resolution Date: 07/26/2018 COPELAND, NEISEN ([id number]) Goal Status: Active Interventions: Provide education on orientation to the wound center Notes: ` Venous Leg Ulcer Nursing Diagnoses: Actual venous Insuffiency (use after diagnosis is confirmed) Knowledge deficit related to disease process and management Goals: Patient/caregiver will verbalize understanding of disease process and disease management Date Initiated: 07/24/2018 Target Resolution Date: 07/26/2018 Goal Status: Active Interventions: Compression as ordered Treatment Activities: Non-invasive vascular studies : 07/24/2018 Notes: ` Wound/Skin Impairment Nursing Diagnoses: Impaired tissue integrity Goals: Ulcer/skin breakdown will have a volume reduction of 30% by week 4 Date Initiated: 07/24/2018 Target Resolution Date: 08/23/2018 Goal Status: Active Interventions: Assess ulceration(s) every visit Provide education on ulcer and skin care Treatment Activities: Referred to DME Hartleigh Edmonston for dressing supplies : 07/24/2018 Notes: Electronic Signature(s) Signed: 08/07/2018 5:18:50 PM By: Elliot Gurney, BSN, RN, CWS, Kim RN, BSN Entered By: Elliot Gurney, BSN, RN, CWS, Kim on 08/07/2018 13:09:29 DAKARI, CREGGER ([id number]) -------------------------------------------------------------------------------- Pain Assessment Details Patient Name: CORAN, DIPAOLA. Date of Service: 08/07/2018 12:30 PM Medical Record Number: [id number] Patient Account Number: [id number] Date of Birth/Sex: Dec 10, 1946 (72 y.o. M) Treating RN: Phillis Haggis Primary Care Torsten Weniger: Duncan Dull Other Clinician: Referring Bacilio Abascal: Duncan Dull Treating Chole Driver/Extender: Bonnell Public Weeks in Treatment: 2 Active Problems Location of Pain Severity and Description of Pain Patient Has Paino No Site Locations Pain Management and Medication Current Pain  Management: Electronic Signature(s) Signed: 08/12/2018 5:34:19 PM By: Alejandro Mulling Entered By: Alejandro Mulling on 08/07/2018 12:43:31 Victor Daniels ([id number]) -------------------------------------------------------------------------------- Patient/Caregiver Education Details Patient Name: RANDEL, HARGENS. Date of Service: 08/07/2018 12:30 PM Medical Record Number: [id number] Patient Account Number: [id number] Date of Birth/Gender: 1946-07-31 (72 y.o. M) Treating RN: Curtis Sites Primary Care Physician: Duncan Dull Other Clinician: Referring Physician: Duncan Dull Treating Physician/Extender: Kathreen Cosier in Treatment: 2 Education Assessment Education Provided To: Patient Education Topics Provided Venous: Handouts: Other: wrap precautions Methods: Demonstration, Explain/Verbal Responses: State content correctly Electronic Signature(s) Signed: 08/08/2018 4:33:06 PM By: Curtis Sites Entered By: Curtis Sites on 08/07/2018 14:55:17 Victor Daniels ([id number]) -------------------------------------------------------------------------------- Wound Assessment Details Patient Name: DAYMON, HORA. Date of Service: 08/07/2018 12:30 PM Medical Record Number: [id number] Patient Account Number: [id number] Date of Birth/Sex: [birth year] (72 y.o. M) Treating RN: Phillis Haggis Primary Care Rolland Steinert: Duncan Dull Other Clinician: Referring Lanessa Shill: Duncan Dull Treating Chesky Heyer/Extender: Bonnell Public Weeks in Treatment: 2 Wound Status Wound Number: 10 Primary Etiology: Lymphedema Wound Location: Left, Distal, Anterior Lower Leg Wound Status: Healed - Epithelialized Wounding Event: Blister Date Acquired: 06/24/2018 Weeks Of Treatment: 2 Clustered Wound: No Photos Photo Uploaded By: Alejandro Mulling on 08/07/2018 16:16:05 Wound Measurements Length: (cm) 0 Width: (cm) 0 Depth: (cm) 0 Area: (cm) 0 Volume: (cm) 0 % Reduction  in Area: 100% % Reduction in Volume: 100% Wound Description Classification: Partial Thickness Periwound Skin Texture Texture Color No Abnormalities Noted: No No Abnormalities Noted: No Moisture No Abnormalities Noted: No Electronic Signature(s) Signed: 08/12/2018 5:34:19 PM By: Alejandro Mulling Entered By: Alejandro Mulling on 08/07/2018 12:56:19 Victor Daniels ([id number]) -------------------------------------------------------------------------------- Wound Assessment Details Patient Name: Victor Daniels. Date of Service: 08/07/2018 12:30 PM Medical Record Number: [id number] Patient Account Number: [id number] Date of Birth/Sex: [birth year] (72 y.o. M) Treating RN: Phillis Haggis Primary Care  Roopa Graver: Duncan Dull Other Clinician: Referring Kinney Sackmann: Duncan Dull Treating Krithi Bray/Extender: Bonnell Public Weeks in Treatment: 2 Wound Status Wound Number: 11 Primary Lymphedema Etiology: Wound Location: Left Lower Leg - Posterior Wound Open Wounding Event: Gradually Appeared Status: Date Acquired: 08/07/2018 Comorbid Cataracts, Anemia, Asthma, Chronic Obstructive Weeks Of Treatment: 0 History: Pulmonary Disease (COPD), Arrhythmia, Clustered Wound: No Coronary Artery Disease, Hypertension, Type II Diabetes, Osteoarthritis Photos Photo Uploaded By: Alejandro Mulling on 08/07/2018 16:16:06 Wound Measurements Length: (cm) 0.2 Width: (cm) 0.2 Depth: (cm) 0.1 Area: (cm) 0.031 Volume: (cm) 0.003 % Reduction in Area: % Reduction in Volume: Epithelialization: None Tunneling: No Undermining: No Wound Description Full Thickness Without Exposed Support Classification: Structures Wound Margin: Flat and Intact Exudate Medium Amount: Exudate Type: Sanguinous Exudate Color: red Foul Odor After Cleansing: No Slough/Fibrino No Wound Bed Granulation Amount: Large (67-100%) Exposed Structure Granulation Quality: Red Fascia Exposed: No Necrotic Amount:  None Present (0%) Fat Layer (Subcutaneous Tissue) Exposed: No Tendon Exposed: No Muscle Exposed: No Joint Exposed: No SALIF, TAY. ([id number]) Bone Exposed: No Periwound Skin Texture Texture Color No Abnormalities Noted: No No Abnormalities Noted: No Moisture Temperature / Pain No Abnormalities Noted: No Temperature: No Abnormality Wound Preparation Ulcer Cleansing: Rinsed/Irrigated with Saline Topical Anesthetic Applied: Other: lidocaine 4%, Electronic Signature(s) Signed: 08/12/2018 5:34:19 PM By: Alejandro Mulling Entered By: Alejandro Mulling on 08/07/2018 13:00:17 Victor Daniels ([id number]) -------------------------------------------------------------------------------- Wound Assessment Details Patient Name: JAIEL, SARACENO. Date of Service: 08/07/2018 12:30 PM Medical Record Number: [id number] Patient Account Number: [id number] Date of Birth/Sex: February 22, 1946 (72 y.o. M) Treating RN: Phillis Haggis Primary Care Vasilisa Vore: Duncan Dull Other Clinician: Referring Brynnlee Cumpian: Duncan Dull Treating Shawnya Mayor/Extender: Bonnell Public Weeks in Treatment: 2 Wound Status Wound Number: 7 Primary Lymphedema Etiology: Wound Location: Left Lower Leg - Medial, Anterior Wound Open Wounding Event: Blister Status: Date Acquired: 06/24/2018 Comorbid Cataracts, Anemia, Asthma, Chronic Obstructive Weeks Of Treatment: 2 History: Pulmonary Disease (COPD), Arrhythmia, Clustered Wound: No Coronary Artery Disease, Hypertension, Type II Diabetes, Osteoarthritis Photos Photo Uploaded By: Alejandro Mulling on 08/07/2018 16:16:50 Wound Measurements Length: (cm) 6 Width: (cm) 4 Depth: (cm) 0.1 Area: (cm) 18.85 Volume: (cm) 1.885 % Reduction in Area: 29.1% % Reduction in Volume: 29.1% Epithelialization: Small (1-33%) Tunneling: No Undermining: No Wound Description Full Thickness Without Exposed Support Classification: Structures Wound Margin: Distinct,  outline attached Exudate Large Amount: Exudate Type: Purulent Exudate Color: yellow, brown, green Foul Odor After Cleansing: No Slough/Fibrino Yes Wound Bed Granulation Amount: Medium (34-66%) Exposed Structure Granulation Quality: Red, Pink Fascia Exposed: No Necrotic Amount: Medium (34-66%) Fat Layer (Subcutaneous Tissue) Exposed: No Necrotic Quality: Adherent Slough Tendon Exposed: No Muscle Exposed: No Joint Exposed: No LOREN, SAWAYA R. ([id number]) Bone Exposed: No Periwound Skin Texture Texture Color No Abnormalities Noted: No No Abnormalities Noted: No Callus: No Atrophie Blanche: No Crepitus: No Cyanosis: No Excoriation: No Ecchymosis: No Induration: No Erythema: No Rash: No Hemosiderin Staining: No Scarring: No Mottled: No Pallor: No Moisture Rubor: No No Abnormalities Noted: No Dry / Scaly: No Temperature / Pain Maceration: No Temperature: No Abnormality Tenderness on Palpation: Yes Wound Preparation Ulcer Cleansing: Rinsed/Irrigated with Saline, Other: soap and water, Topical Anesthetic Applied: Other: lidocaine 4%, Electronic Signature(s) Signed: 08/12/2018 5:34:19 PM By: Alejandro Mulling Entered By: Alejandro Mulling on 08/07/2018 12:58:28 Victor Daniels ([id number]) -------------------------------------------------------------------------------- Wound Assessment Details Patient Name: MALLIE, LINNEMANN. Date of Service: 08/07/2018 12:30 PM Medical Record Number: [id number] Patient Account Number: [id number] Date of Birth/Sex: [birth year] (72 y.o. M) Treating RN: Phillis Haggis Primary Care Jakeim Sedore:  Duncan Dull Other Clinician: Referring Douglass Dunshee: Duncan Dull Treating Lisamarie Coke/Extender: Bonnell Public Weeks in Treatment: 2 Wound Status Wound Number: 9 Primary Etiology: Lymphedema Wound Location: Left, Proximal, Anterior Lower Leg Wound Status: Healed - Epithelialized Wounding Event: Blister Date Acquired:  06/24/2018 Weeks Of Treatment: 2 Clustered Wound: No Photos Photo Uploaded By: Alejandro Mulling on 08/07/2018 16:16:51 Wound Measurements Length: (cm) 0 % Width: (cm) 0 % Depth: (cm) 0 Area: (cm) 0 Volume: (cm) 0 Reduction in Area: 100% Reduction in Volume: 100% Wound Description Classification: Partial Thickness Periwound Skin Texture Texture Color No Abnormalities Noted: No No Abnormalities Noted: No Moisture No Abnormalities Noted: No Electronic Signature(s) Signed: 08/12/2018 5:34:19 PM By: Alejandro Mulling Entered By: Alejandro Mulling on 08/07/2018 12:56:36 Victor Daniels ([id number]) -------------------------------------------------------------------------------- Vitals Details Patient Name: Victor Daniels. Date of Service: 08/07/2018 12:30 PM Medical Record Number: [id number] Patient Account Number: [id number] Date of Birth/Sex: Nov 29, 1946 (72 y.o. M) Treating RN: Phillis Haggis Primary Care Shekita Boyden: Duncan Dull Other Clinician: Referring Wayden Schwertner: Duncan Dull Treating Kailia Starry/Extender: Kathreen Cosier in Treatment: 2 Vital Signs Time Taken: 12:43 Temperature (F): 98.3 Height (in): 72 Pulse (bpm): 72 Weight (lbs): 293.4 Respiratory Rate (breaths/min): 18 Body Mass Index (BMI): 39.8 Blood Pressure (mmHg): 133/70 Reference Range: 80 - 120 mg / dl Electronic Signature(s) Signed: 08/12/2018 5:34:19 PM By: Alejandro Mulling Entered By: Alejandro Mulling on 08/07/2018 12:44:25

## 2018-08-23 NOTE — Progress Notes (Signed)
ARAS, ALBARRAN ([id number]) Visit Report for 08/14/2018 HPI Details Patient Name: Victor Daniels, Victor Daniels. Date of Service: 08/14/2018 12:30 PM Medical Record Number: [id number] Patient Account Number: [id number] Date of Birth/Sex: [birth year] (72 y.o. M) Treating RN: Huel Coventry Primary Care Provider: Duncan Dull Other Clinician: Referring Provider: Duncan Dull Treating Provider/Extender: Altamese [place] in Treatment: 3 History of Present Illness Location: Patient has bilateral venous stasis with ulcerations with inflammation of the left leg Severity: The wounds are very clean and had been improving and measured smaller with silver collagen Duration: The patient has had problems with venous ulcers bilaterally from time to time for the last couple of years HPI Description: 07/24/18 This is a now 72 year old man who we have had in this clinic 3 times in the past. In 2015 he was here for left leg ulcer felt to be secondary to chronic venous insufficiency. In September 2017 here for bilateral lower extremity wounds again felt to be secondary to chronic venous insufficiency. He was discharged on this Occasion with 2030 mm below-knee stockings although there was a comment that he would probably need more than that perhaps 30-40 below-knee stockings. The last time he was here was from 10/17/17 through 10/31/17. He had a traumatic wound on his left great toe that closed. I had ordered arterial studies on him at that point although it doesn't look like these were ever done. Currently the patient says about 6 weeks ago he started developing more swelling in the left leg. The left leg is chronically more swollen than the right but he said the swelling got to the point where his foot and ankle were tight. He then developed a large blistero Hematoma on the left anterior tibial area with 2 other small wounds anteriorly and one posteriorly. He is only been putting gauze on these. He has very  old 20-30 mm below-knee stockings but I doubt he is getting enough compression from these. Past medical history is remarkable for COPD on what appears to be when necessary oxygen, obstructive sleep apnea [has not tolerated CPAP], type 2 diabetes on diet control with a recent hemoglobin A1c of 6.4. also hypertension, coronary artery disease and congestive heart failure. The patient tells me he had a CT scan of his chest 6-8 weeks ago to follow-up for a left upper lobe pulmonary nodule. ABIs were noncompressible in this clinic bilaterally 07/31/18-He is seen in follow-up evaluation for left lateral lower leg ulceration. He is tolerating compression therapy. He has been submitted for new compression stockings. His current compression stockings appear to be low-grade (8-12, 15-20 mmHg), he also admits using these improperly applying at bedtime and removing in the morning. He has been reeducated on appropriate compression stocking where. We will continue with same treatment plan he will follow-up next week. He has also been encouraged to maintain compression, not to remove prior to follow up appointment 08/07/18-He is seen in follow-up evaluation for left lower extremity ulcers. There is significant improvement and we will continue with same treatment plan. He did obtain to repair of 20-30 mmHg compression stockings from elastic therapy but states that he cannot put these on and as been wearing his old stockings to the right leg. He will be ordering stockings through [facility]. 08/14/18; he is here for follow-up evaluation of his left lower extremity ulcer. This is anteriorly and really is a lot smaller with only a small healthy-looking open area remaining. He did obtain appear of 20/30 mm compression [place] [facility] [place]  but states these were too tight he couldn't get him on. He brings in some 15 mm support hose, I told him that I don't think this will be sufficient. He does  not have sufficient funds to pay for wraparound stockings such as juxtalite stockings Electronic Signature(s) Signed: 08/14/2018 5:35:06 PM By: Baltazar Najjar MD Entered By: Baltazar Najjar on 08/14/2018 12:58:15 ZANIEL, MARINEAU ([id number]) JAMIS, KRYDER ([id number]) -------------------------------------------------------------------------------- Physical Exam Details Patient Name: [name], [name]. Date of Service: 08/14/2018 12:30 PM Medical Record Number: [id number] Patient Account Number: [id number] Date of Birth/Sex: 1946-12-25 (72 y.o. M) Treating RN: Huel Coventry Primary Care Provider: Duncan Dull Other Clinician: Referring Provider: Duncan Dull Treating Provider/Extender: Altamese [place] in Treatment: 3 Constitutional Sitting or standing Blood Pressure is within target range for patient.. Pulse regular and within target range for patient.Marland Kitchen Respirations regular, non-labored and within target range.. Temperature is normal and within the target range for the patient.Marland Kitchen appears in no distress. Respiratory Respiratory effort is easy and symmetric bilaterally. Rate is normal at rest and on room air.. Cardiovascular Pedal pulses palpable and strong bilaterally.. severe bilateral hemosiderin deposition was secondary lymphedema. Lymphatic none palpable in the popliteal area bilaterally. Integumentary (Hair, Skin) nothing outside of the lower extremities, i.e. no primary skin issues are seen. Psychiatric No evidence of depression, anxiety, or agitation. Calm, cooperative, and communicative. Appropriate interactions and affect.. Notes wound exam; the patient has a much smaller wound than when I saw him 3 weeks ago. Only a small open area remains most of this is epithelialized Electronic Signature(s) Signed: 08/14/2018 5:35:06 PM By: Baltazar Najjar MD Entered By: Baltazar Najjar on 08/14/2018 13:00:09 [name]  ([id number]) -------------------------------------------------------------------------------- Physician Orders Details Patient Name: AHMADOU, BOLZ. Date of Service: 08/14/2018 12:30 PM Medical Record Number: [id number] Patient Account Number: [id number] Date of Birth/Sex: 1946/12/20 (72 y.o. M) Treating RN: Curtis Sites Primary Care Provider: Duncan Dull Other Clinician: Referring Provider: Duncan Dull Treating Provider/Extender: Altamese [place] in Treatment: 3 Verbal / Phone Orders: No Diagnosis Coding Wound Cleansing Wound #7 Left,Medial,Anterior Lower Leg o Cleanse wound with mild soap and water Anesthetic (add to Medication List) Wound #7 Left,Medial,Anterior Lower Leg o Topical Lidocaine 4% cream applied to wound bed prior to debridement (In Clinic Only). Skin Barriers/Peri-Wound Care Wound #7 Left,Medial,Anterior Lower Leg o Barrier cream Primary Wound Dressing Wound #7 Left,Medial,Anterior Lower Leg o Silver Alginate Secondary Dressing Wound #7 Left,Medial,Anterior Lower Leg o Dry Gauze Dressing Change Frequency Wound #7 Left,Medial,Anterior Lower Leg o Change dressing every week Follow-up Appointments Wound #7 Left,Medial,Anterior Lower Leg o Return Appointment in 1 week. o Nurse Visit as needed Edema Control Wound #7 Left,Medial,Anterior Lower Leg o 3 Layer Compression System - Left Lower Extremity Additional Orders / Instructions Wound #7 Left,Medial,Anterior Lower Leg o Other: - ETI Information for 20-28mmhg stockings (50cm to knee, 30cm ankle, 47cm calf) Electronic Signature(s) Signed: 08/14/2018 5:18:29 PM By: Enrigue Catena ([id number]) Signed: 08/14/2018 5:35:06 PM By: Baltazar Najjar MD Entered By: Curtis Sites on 08/14/2018 12:53:45 [name] ([id number]) -------------------------------------------------------------------------------- Problem List Details Patient Name:  CHESKEL, SILVERIO. Date of Service: 08/14/2018 12:30 PM Medical Record Number: [id number] Patient Account Number: [id number] Date of Birth/Sex: 1946-03-15 (72 y.o. M) Treating RN: Huel Coventry Primary Care Provider: Duncan Dull Other Clinician: Referring Provider: Duncan Dull Treating Provider/Extender: Altamese [place] in Treatment: 3 Active Problems ICD-10 Evaluated Encounter Code Description Active Date Today Diagnosis L97.221 Non-pressure chronic ulcer of left calf limited to  breakdown of 07/24/2018 No Yes skin I87.332 Chronic venous hypertension (idiopathic) with ulcer and 07/24/2018 No Yes inflammation of left lower extremity I89.0 Lymphedema, not elsewhere classified 07/24/2018 No Yes E11.622 Type 2 diabetes mellitus with other skin ulcer 07/24/2018 No Yes Inactive Problems Resolved Problems Electronic Signature(s) Signed: 08/14/2018 5:35:06 PM By: Baltazar Najjar MD Entered By: Baltazar Najjar on 08/14/2018 12:55:10 [name] ([id number]) -------------------------------------------------------------------------------- Progress Note Details Patient Name: [name]. Date of Service: 08/14/2018 12:30 PM Medical Record Number: [id number] Patient Account Number: [id number] Date of Birth/Sex: 01/14/1946 (72 y.o. M) Treating RN: Huel Coventry Primary Care Provider: Duncan Dull Other Clinician: Referring Provider: Duncan Dull Treating Provider/Extender: Altamese [place] in Treatment: 3 Subjective History of Present Illness (HPI) The following HPI elements were documented for the patient's wound: Location: Patient has bilateral venous stasis with ulcerations with inflammation of the left leg Severity: The wounds are very clean and had been improving and measured smaller with silver collagen Duration: The patient has had problems with venous ulcers bilaterally from time to time for the last couple of years 07/24/18 This is a now  72 year old man who we have had in this clinic 3 times in the past. In 2015 he was here for left leg ulcer felt to be secondary to chronic venous insufficiency. In September 2017 here for bilateral lower extremity wounds again felt to be secondary to chronic venous insufficiency. He was discharged on this Occasion with 2030 mm below-knee stockings although there was a comment that he would probably need more than that perhaps 30-40 below-knee stockings. The last time he was here was from 10/17/17 through 10/31/17. He had a traumatic wound on his left great toe that closed. I had ordered arterial studies on him at that point although it doesn't look like these were ever done. Currently the patient says about 6 weeks ago he started developing more swelling in the left leg. The left leg is chronically more swollen than the right but he said the swelling got to the point where his foot and ankle were tight. He then developed a large blistero Hematoma on the left anterior tibial area with 2 other small wounds anteriorly and one posteriorly. He is only been putting gauze on these. He has very old 20-30 mm below-knee stockings but I doubt he is getting enough compression from these. Past medical history is remarkable for COPD on what appears to be when necessary oxygen, obstructive sleep apnea [has not tolerated CPAP], type 2 diabetes on diet control with a recent hemoglobin A1c of 6.4. also hypertension, coronary artery disease and congestive heart failure. The patient tells me he had a CT scan of his chest 6-8 weeks ago to follow-up for a left upper lobe pulmonary nodule. ABIs were noncompressible in this clinic bilaterally 07/31/18-He is seen in follow-up evaluation for left lateral lower leg ulceration. He is tolerating compression therapy. He has been submitted for new compression stockings. His current compression stockings appear to be low-grade (8-12, 15-20 mmHg), he also admits using these  improperly applying at bedtime and removing in the morning. He has been reeducated on appropriate compression stocking where. We will continue with same treatment plan he will follow-up next week. He has also been encouraged to maintain compression, not to remove prior to follow up appointment 08/07/18-He is seen in follow-up evaluation for left lower extremity ulcers. There is significant improvement and we will continue with same treatment plan. He did obtain to repair of 20-30 mmHg compression stockings from  elastic therapy but states that he cannot put these on and as been wearing his old stockings to the right leg. [facility] 08/14/18; he is here for follow-up evaluation of his left lower extremity ulcer. This is anteriorly and really is a lot smaller with only a small healthy-looking open area remaining. He did obtain appear of 20/30 mm compression stockings from elastic therapy and [place] but states these were too tight he couldn't get him on. He brings in some 15 mm support hose, I told him that I don't think this will be sufficient. He does not have sufficient funds to pay for wraparound stockings such as juxtalite stockings [name], Asaiah R. ([id number]) Objective Constitutional Sitting or standing Blood Pressure is within target range for patient.. Pulse regular and within target range for patient.Marland Kitchen. Respirations regular, non-labored and within target range.. Temperature is normal and within the target range for the patient.Marland Kitchen. appears in no distress. Vitals Time Taken: 12:30 PM, Height: 72 in, Weight: 293.4 lbs, BMI: 39.8, Temperature: 98.5 F, Pulse: 80 bpm, Respiratory Rate: 20 breaths/min, Blood Pressure: 129/70 mmHg. Respiratory Respiratory effort is easy and symmetric bilaterally. Rate is normal at rest and on room air.. Cardiovascular Pedal pulses palpable and strong bilaterally.. severe bilateral hemosiderin deposition was  secondary lymphedema. Lymphatic none palpable in the popliteal area bilaterally. Psychiatric No evidence of depression, anxiety, or agitation. Calm, cooperative, and communicative. Appropriate interactions and affect.. General Notes: wound exam; the patient has a much smaller wound than when I saw him 3 weeks ago. Only a small open area remains most of this is epithelialized Integumentary (Hair, Skin) nothing outside of the lower extremities, i.e. no primary skin issues are seen. Wound #11 status is Healed - Epithelialized. Original cause of wound was Gradually Appeared. The wound is located on the Left,Posterior Lower Leg. The wound measures 0cm length x 0cm width x 0cm depth; 0cm^2 area and 0cm^3 volume. There is no tunneling or undermining noted. There is a none present amount of drainage noted. The wound margin is flat and intact. There is no granulation within the wound bed. There is no necrotic tissue within the wound bed. The periwound skin appearance did not exhibit: Callus, Crepitus, Excoriation, Induration, Rash, Scarring, Dry/Scaly, Maceration, Atrophie Blanche, Cyanosis, Ecchymosis, Hemosiderin Staining, Mottled, Pallor, Rubor, Erythema. Periwound temperature was noted as No Abnormality. Wound #7 status is Open. Original cause of wound was Blister. The wound is located on the Left,Medial,Anterior Lower Leg. The wound measures 2cm length x 1cm width x 0.1cm depth; 1.571cm^2 area and 0.157cm^3 volume. There is Fat Layer (Subcutaneous Tissue) Exposed exposed. There is no tunneling or undermining noted. There is a small amount of serous drainage noted. The wound margin is distinct with the outline attached to the wound base. There is small (1-33%) pink granulation within the wound bed. There is a medium (34-66%) amount of necrotic tissue within the wound bed including Adherent Slough. The periwound skin appearance exhibited: Scarring. The periwound skin appearance did not exhibit:  Callus, Crepitus, Excoriation, Induration, Rash, Dry/Scaly, Maceration, Atrophie Blanche, Cyanosis, Ecchymosis, Hemosiderin Staining, Mottled, Pallor, Rubor, Erythema. Periwound temperature was noted as No Abnormality. The periwound has tenderness on palpation. Assessment Active Problems [name], Handy R. ([id number]) ICD-10 Non-pressure chronic ulcer of left calf limited to breakdown of skin Chronic venous hypertension (idiopathic) with ulcer and inflammation of left lower extremity Lymphedema, not elsewhere classified Type 2 diabetes mellitus with other skin ulcer Procedures Wound #7 Pre-procedure diagnosis of Wound #7 is  a Lymphedema located on the Left,Medial,Anterior Lower Leg . There was a Three Layer Compression Therapy Procedure by Curtis Sites, RN. Post procedure Diagnosis Wound #7: Same as Pre-Procedure Plan Wound Cleansing: Wound #7 Left,Medial,Anterior Lower Leg: Cleanse wound with mild soap and water Anesthetic (add to Medication List): Wound #7 Left,Medial,Anterior Lower Leg: Topical Lidocaine 4% cream applied to wound bed prior to debridement (In Clinic Only). Skin Barriers/Peri-Wound Care: Wound #7 Left,Medial,Anterior Lower Leg: Barrier cream Primary Wound Dressing: Wound #7 Left,Medial,Anterior Lower Leg: Silver Alginate Secondary Dressing: Wound #7 Left,Medial,Anterior Lower Leg: Dry Gauze Dressing Change Frequency: Wound #7 Left,Medial,Anterior Lower Leg: Change dressing every week Follow-up Appointments: Wound #7 Left,Medial,Anterior Lower Leg: Return Appointment in 1 week. Nurse Visit as needed Edema Control: Wound #7 Left,Medial,Anterior Lower Leg: 3 Layer Compression System - Left Lower Extremity Additional Orders / Instructions: Wound #7 Left,Medial,Anterior Lower Leg: Other: - ETI Information for 20-29mmhg stockings (50cm to knee, 30cm ankle, 47cm calf) #1silver alginate under 3 layer compression. This may be healed by next  week CHANEL, MCKESSON. ([id number]) #2 I will allow him to use his support stockings on the right leg as it doesn't seem we have an option #3 there was a comment made from his visit last week. We are ordering stockings through [facility] however I am not sure what this means #4 he states the elastic therapy stockings were too tight. I've asked him to bring these in next week #5 he cannot afford wraparound type stockings #6 he may be closed next week I am not sure what option will have. I note that he was not wearing the stockings he had properly i.e. just at night Electronic Signature(s) Signed: 08/14/2018 5:35:06 PM By: Baltazar Najjar MD Entered By: Baltazar Najjar on 08/14/2018 13:01:54 [name] ([id number]) -------------------------------------------------------------------------------- SuperBill Details Patient Name: [name]. Date of Service: 08/14/2018 Medical Record Number: [id number] Patient Account Number: [id number] Date of Birth/Sex: 1946/08/15 (72 y.o. M) Treating RN: Curtis Sites Primary Care Provider: Duncan Dull Other Clinician: Referring Provider: Duncan Dull Treating Provider/Extender: Altamese [place] in Treatment: 3 Diagnosis Coding ICD-10 Codes Code Description [phone] Non-pressure chronic ulcer of left calf limited to breakdown of skin I87.332 Chronic venous hypertension (idiopathic) with ulcer and inflammation of left lower extremity I89.0 Lymphedema, not elsewhere classified E11.622 Type 2 diabetes mellitus with other skin ulcer Facility Procedures CPT4 Code: [id number] Description: (Facility Use Only) [phone] - APPLY MULTLAY COMPRS LWR LT LEG Modifier: Quantity: 1 Physician Procedures CPT4: Description Modifier Quantity Code [id number] - WC PHYS LEVEL 3 - EST PT 1 ICD-10 Diagnosis Description L97.221 Non-pressure chronic ulcer of left calf limited to breakdown of skin I87.332 Chronic venous hypertension (idiopathic)  with ulcer and  inflammation of left lower extremity I89.0 Lymphedema, not elsewhere classified Electronic Signature(s) Signed: 08/14/2018 5:35:06 PM By: Baltazar Najjar MD Entered By: Baltazar Najjar on 08/14/2018 13:02:17

## 2018-08-23 NOTE — Progress Notes (Signed)
ROOSVELT, CHURCHWELL ([id number]) Visit Report for 08/14/2018 Arrival Information Details Patient Name: SCOTTY, WEIGELT. Date of Service: 08/14/2018 12:30 PM Medical Record Number: [id number] Patient Account Number: [id number] Date of Birth/Sex: 1946-07-16 (72 y.o. M) Treating RN: Rema Jasmine Primary Care Anayla Giannetti: Duncan Dull Other Clinician: Referring Glynnis Gavel: Duncan Dull Treating Robt Okuda/Extender: Altamese [place] in Treatment: 3 Visit Information History Since Last Visit Added or deleted any medications: No Patient Arrived: Ambulatory Any new allergies or adverse reactions: No Arrival Time: 12:31 Had a fall or experienced change in No Accompanied By: self activities of daily living that may affect Transfer Assistance: None risk of falls: Patient Identification Verified: Yes Signs or symptoms of abuse/neglect since last visito No Secondary Verification Process Yes Hospitalized since last visit: No Completed: Implantable device outside of the clinic excluding No Patient Requires Transmission-Based No cellular tissue based products placed in the center Precautions: since last visit: Patient Has Alerts: Yes Has Dressing in Place as Prescribed: Yes Patient Alerts: Sebree bilateral Has Compression in Place as Prescribed: Yes 07/24/2018 Pain Present Now: No Electronic Signature(s) Signed: 08/14/2018 3:41:35 PM By: Rema Jasmine Entered By: Rema Jasmine on 08/14/2018 12:32:33 [name] ([id number]) -------------------------------------------------------------------------------- Compression Therapy Details Patient Name: [name]. Date of Service: 08/14/2018 12:30 PM Medical Record Number: [id number] Patient Account Number: [id number] Date of Birth/Sex: 12/17/1946 (72 y.o. M) Treating RN: Curtis Sites Primary Care Kahliyah Dick: Duncan Dull Other Clinician: Referring Savannaha Stonerock: Duncan Dull Treating Allin Frix/Extender: Altamese [place]  in Treatment: 3 Compression Therapy Performed for Wound Assessment: Wound #7 Left,Medial,Anterior Lower Leg Performed By: Clinician Curtis Sites, RN Compression Type: Three Layer Post Procedure Diagnosis Same as Pre-procedure Electronic Signature(s) Signed: 08/14/2018 5:18:29 PM By: Curtis Sites Entered By: Curtis Sites on 08/14/2018 12:54:21 [name] ([id number]) -------------------------------------------------------------------------------- Encounter Discharge Information Details Patient Name: CALIEB, LICHTMAN. Date of Service: 08/14/2018 12:30 PM Medical Record Number: [id number] Patient Account Number: [id number] Date of Birth/Sex: [birth year] (72 y.o. M) Treating RN: Renne Crigler Primary Care Savon Cobbs: Duncan Dull Other Clinician: Referring Cyla Haluska: Duncan Dull Treating Smita Lesh/Extender: Altamese [place] in Treatment: 3 Encounter Discharge Information Items Discharge Condition: Stable Ambulatory Status: Ambulatory Discharge Destination: Home Transportation: Private Auto Schedule Follow-up Appointment: Yes Clinical Summary of Care: Electronic Signature(s) Signed: 08/14/2018 4:37:51 PM By: Renne Crigler Entered By: Renne Crigler on 08/14/2018 13:11:11 [name] ([id number]) -------------------------------------------------------------------------------- Lower Extremity Assessment Details Patient Name: EDREES, VALENT. Date of Service: 08/14/2018 12:30 PM Medical Record Number: [id number] Patient Account Number: [id number] Date of Birth/Sex: 05-11-1946 (72 y.o. M) Treating RN: Rema Jasmine Primary Care Jozef Eisenbeis: Duncan Dull Other Clinician: Referring Judieth Mckown: Duncan Dull Treating Melea Prezioso/Extender: Altamese [place] in Treatment: 3 Edema Assessment Assessed: [Left: No] [Right: No] Edema: [Left: N] [Right: o] Calf Left: Right: Point of Measurement: 33 cm From Medial Instep 47 cm cm Ankle Left:  Right: Point of Measurement: 10 cm From Medial Instep 27.5 cm cm Vascular Assessment Claudication: Claudication Assessment [Left:None] Pulses: Dorsalis Pedis Palpable: [Left:Yes] Posterior Tibial Extremity colors, hair growth, and conditions: Extremity Color: [Left:Hyperpigmented] Hair Growth on Extremity: [Left:No] Temperature of Extremity: [Left:Warm] Capillary Refill: [Left:< 3 seconds] Toe Nail Assessment Left: Right: Thick: Yes Discolored: Yes Deformed: Yes Improper Length and Hygiene: No Electronic Signature(s) Signed: 08/14/2018 3:41:35 PM By: Rema Jasmine Entered By: Rema Jasmine on 08/14/2018 12:45:35 [name] ([id number]) -------------------------------------------------------------------------------- Multi Wound Chart Details Patient Name: [name]. Date of Service: 08/14/2018 12:30 PM Medical Record Number: [id number] Patient Account Number: [id number] Date of Birth/Sex:  [birth year] (72 y.o. M) Treating RN: Curtis Sites Primary Care Vineeth Fell: Duncan Dull Other Clinician: Referring Khloe Hunkele: Duncan Dull Treating Aayra Hornbaker/Extender: Altamese [place] in Treatment: 3 Vital Signs Height(in): 72 Pulse(bpm): 80 Weight(lbs): 293.4 Blood Pressure(mmHg): 129/70 Body Mass Index(BMI): 40 Temperature(F): 98.5 Respiratory Rate 20 (breaths/min): Photos: [N/A:N/A] Wound Location: Left Lower Leg - Posterior Left Lower Leg - Medial, N/A Anterior Wounding Event: Gradually Appeared Blister N/A Primary Etiology: Lymphedema Lymphedema N/A Comorbid History: Cataracts, Anemia, Asthma, Cataracts, Anemia, Asthma, N/A Chronic Obstructive Chronic Obstructive Pulmonary Disease (COPD), Pulmonary Disease (COPD), Arrhythmia, Coronary Artery Arrhythmia, Coronary Artery Disease, Hypertension, Type II Disease, Hypertension, Type II Diabetes, Osteoarthritis Diabetes, Osteoarthritis Date Acquired: 08/07/2018 06/24/2018 N/A Weeks of Treatment: 1 3  N/A Wound Status: Healed - Epithelialized Open N/A Measurements L x W x D 0x0x0 2x1x0.1 N/A (cm) Area (cm) : 0 1.571 N/A Volume (cm) : 0 0.157 N/A % Reduction in Area: 100.00% 94.10% N/A % Reduction in Volume: 100.00% 94.10% N/A Classification: Full Thickness Without Full Thickness Without N/A Exposed Support Structures Exposed Support Structures Exudate Amount: None Present Small N/A Exudate Type: N/A Serous N/A Exudate Color: N/A amber N/A Wound Margin: Flat and Intact Distinct, outline attached N/A Granulation Amount: None Present (0%) Small (1-33%) N/A Granulation Quality: N/A Pink N/A Necrotic Amount: None Present (0%) Medium (34-66%) N/A Exposed Structures: N/A RONELLE, SMALLMAN ([id number]) Fascia: No Fat Layer (Subcutaneous Fat Layer (Subcutaneous Tissue) Exposed: Yes Tissue) Exposed: No Fascia: No Tendon: No Tendon: No Muscle: No Muscle: No Joint: No Joint: No Bone: No Bone: No Epithelialization: None Small (1-33%) N/A Periwound Skin Texture: Excoriation: No Scarring: Yes N/A Induration: No Excoriation: No Callus: No Induration: No Crepitus: No Callus: No Rash: No Crepitus: No Scarring: No Rash: No Periwound Skin Moisture: Maceration: No Maceration: No N/A Dry/Scaly: No Dry/Scaly: No Periwound Skin Color: Atrophie Blanche: No Atrophie Blanche: No N/A Cyanosis: No Cyanosis: No Ecchymosis: No Ecchymosis: No Erythema: No Erythema: No Hemosiderin Staining: No Hemosiderin Staining: No Mottled: No Mottled: No Pallor: No Pallor: No Rubor: No Rubor: No Temperature: No Abnormality No Abnormality N/A Tenderness on Palpation: No Yes N/A Wound Preparation: Ulcer Cleansing: Ulcer Cleansing: N/A Rinsed/Irrigated with Saline Rinsed/Irrigated with Saline, Other: soap and water Topical Anesthetic Applied: Other: lidocaine 4% Procedures Performed: N/A Compression Therapy N/A Treatment Notes Electronic Signature(s) Signed: 08/14/2018  5:35:06 PM By: Baltazar Najjar MD Entered By: Baltazar Najjar on 08/14/2018 12:55:51 [name] ([id number]) -------------------------------------------------------------------------------- Multi-Disciplinary Care Plan Details Patient Name: JANSEN, SCIUTO. Date of Service: 08/14/2018 12:30 PM Medical Record Number: [id number] Patient Account Number: [id number] Date of Birth/Sex: 15-Feb-1946 (72 y.o. M) Treating RN: Curtis Sites Primary Care Lashanda Storlie: Duncan Dull Other Clinician: Referring Johathan [place]: Duncan Dull Treating Judd Mccubbin/Extender: Altamese [place] in Treatment: 3 Active Inactive ` Abuse / Safety / Falls / Self Care Management Nursing Diagnoses: Self care deficit: actual or potential Goals: Patient/caregiver will verbalize understanding of skin care regimen Date Initiated: 07/24/2018 Target Resolution Date: 07/26/2018 Goal Status: Active Interventions: Podiatry chair, stretcher in low position and side rails up as needed Notes: ` Nutrition Nursing Diagnoses: Impaired glucose control: actual or potential Goals: Patient/caregiver verbalizes understanding of need to maintain therapeutic glucose control per primary care physician Date Initiated: 07/24/2018 Target Resolution Date: 07/26/2018 Goal Status: Active Interventions: Provide education on elevated blood sugars and impact on wound healing Treatment Activities: Patient referred to Primary Care Physician for further nutritional evaluation : 07/24/2018 Notes: ` Orientation to the Wound Care Program Nursing Diagnoses: Knowledge deficit related to  the wound healing center program Goals: Patient/caregiver will verbalize understanding of the Wound Healing Center Program Date Initiated: 07/24/2018 Target Resolution Date: 07/26/2018 JAMI, OHLIN ([id number]) Goal Status: Active Interventions: Provide education on orientation to the wound center Notes: ` Venous Leg Ulcer Nursing  Diagnoses: Actual venous Insuffiency (use after diagnosis is confirmed) Knowledge deficit related to disease process and management Goals: Patient/caregiver will verbalize understanding of disease process and disease management Date Initiated: 07/24/2018 Target Resolution Date: 07/26/2018 Goal Status: Active Interventions: Compression as ordered Treatment Activities: Non-invasive vascular studies : 07/24/2018 Notes: ` Wound/Skin Impairment Nursing Diagnoses: Impaired tissue integrity Goals: Ulcer/skin breakdown will have a volume reduction of 30% by week 4 Date Initiated: 07/24/2018 Target Resolution Date: 08/23/2018 Goal Status: Active Interventions: Assess ulceration(s) every visit Provide education on ulcer and skin care Treatment Activities: Referred to DME Daron Breeding for dressing supplies : 07/24/2018 Notes: Electronic Signature(s) Signed: 08/14/2018 4:37:51 PM By: Renne Crigler Signed: 08/14/2018 5:18:29 PM By: Curtis Sites Entered By: Renne Crigler on 08/14/2018 12:56:16 [name] ([id number]) -------------------------------------------------------------------------------- Pain Assessment Details Patient Name: [name]. Date of Service: 08/14/2018 12:30 PM Medical Record Number: [id number] Patient Account Number: [id number] Date of Birth/Sex: 1946-11-15 (72 y.o. M) Treating RN: Rema Jasmine Primary Care Donae Kueker: Duncan Dull Other Clinician: Referring Martika Egler: Duncan Dull Treating Kadon Andrus/Extender: Altamese [place] in Treatment: 3 Active Problems Location of Pain Severity and Description of Pain Patient Has Paino No Site Locations Pain Management and Medication Current Pain Management: Goals for Pain Management pt denies any pain at this time. Electronic Signature(s) Signed: 08/14/2018 3:41:35 PM By: Rema Jasmine Entered By: Rema Jasmine on 08/14/2018 12:32:53 [name]  ([id number]) -------------------------------------------------------------------------------- Patient/Caregiver Education Details Patient Name: REYAAN, THOMA. Date of Service: 08/14/2018 12:30 PM Medical Record Number: [id number] Patient Account Number: [id number] Date of Birth/Gender: [birth year] (72 y.o. M) Treating RN: Renne Crigler Primary Care Physician: Duncan Dull Other Clinician: Referring Physician: Duncan Dull Treating Physician/Extender: Altamese [place] in Treatment: 3 Education Assessment Education Provided To: Patient Education Topics Provided Wound/Skin Impairment: Handouts: Caring for Your Ulcer Methods: Explain/Verbal Responses: State content correctly Electronic Signature(s) Signed: 08/14/2018 4:37:51 PM By: Renne Crigler Entered By: Renne Crigler on 08/14/2018 13:11:30 [name] ([id number]) -------------------------------------------------------------------------------- Wound Assessment Details Patient Name: TERIUS, JACUINDE. Date of Service: 08/14/2018 12:30 PM Medical Record Number: [id number] Patient Account Number: [id number] Date of Birth/Sex: 1946-07-12 (72 y.o. M) Treating RN: Rema Jasmine Primary Care Rodney Wigger: Duncan Dull Other Clinician: Referring Bassy Fetterly: Duncan Dull Treating Hisae Decoursey/Extender: Altamese [place] in Treatment: 3 Wound Status Wound Number: 11 Primary Lymphedema Etiology: Wound Location: Left Lower Leg - Posterior Wound Healed - Epithelialized Wounding Event: Gradually Appeared Status: Date Acquired: 08/07/2018 Comorbid Cataracts, Anemia, Asthma, Chronic Obstructive Weeks Of Treatment: 1 History: Pulmonary Disease (COPD), Arrhythmia, Clustered Wound: No Coronary Artery Disease, Hypertension, Type II Diabetes, Osteoarthritis Photos Photo Uploaded By: Rema Jasmine on 08/14/2018 12:49:40 Wound Measurements Length: (cm) 0 % Reduc Width: (cm) 0 % Reduc Depth: (cm) 0  Epithel Area: (cm) 0 Tunnel Volume: (cm) 0 Underm tion in Area: 100% tion in Volume: 100% ialization: None ing: No ining: No Wound Description Full Thickness Without Exposed Support Foul Od Classification: Structures Slough/ Wound Margin: Flat and Intact Exudate None Present Amount: or After Cleansing: No Fibrino No Wound Bed Granulation Amount: None Present (0%) Exposed Structure Necrotic Amount: None Present (0%) Fascia Exposed: No Fat Layer (Subcutaneous Tissue) Exposed: No Tendon Exposed: No Muscle Exposed: No Joint Exposed: No Bone Exposed: No  [name], Zimri R. ([id number]) Periwound Skin Texture Texture Color No Abnormalities Noted: No No Abnormalities Noted: No Callus: No Atrophie Blanche: No Crepitus: No Cyanosis: No Excoriation: No Ecchymosis: No Induration: No Erythema: No Rash: No Hemosiderin Staining: No Scarring: No Mottled: No Pallor: No Moisture Rubor: No No Abnormalities Noted: No Dry / Scaly: No Temperature / Pain Maceration: No Temperature: No Abnormality Wound Preparation Ulcer Cleansing: Rinsed/Irrigated with Saline Electronic Signature(s) Signed: 08/14/2018 3:41:35 PM By: Rema JasmineNg, Wendi Entered By: Rema JasmineNg, Wendi on 08/14/2018 12:41:54 [name], Demarco R. ([id number]) -------------------------------------------------------------------------------- Wound Assessment Details Patient Name: [name], Yakir R. Date of Service: 08/14/2018 12:30 PM Medical Record Number: [id number] Patient Account Number: [id number] Date of Birth/Sex: [birth year](71 y.o. M) Treating RN: Rema JasmineNg, Wendi Primary Care Dessie Tatem: Duncan Dullullo, Teresa Other Clinician: Referring Tyera Hansley: Duncan Dullullo, Teresa Treating Dorance Spink/Extender: Altamese CarolinaOBSON, MICHAEL G Weeks in Treatment: 3 Wound Status Wound Number: 7 Primary Lymphedema Etiology: Wound Location: Left Lower Leg - Medial, Anterior Wound Open Wounding Event: Blister Status: Date Acquired: 06/24/2018 Comorbid Cataracts,  Anemia, Asthma, Chronic Obstructive Weeks Of Treatment: 3 History: Pulmonary Disease (COPD), Arrhythmia, Clustered Wound: No Coronary Artery Disease, Hypertension, Type II Diabetes, Osteoarthritis Photos Photo Uploaded By: Rema JasmineNg, Wendi on 08/14/2018 12:50:04 Wound Measurements Length: (cm) 2 Width: (cm) 1 Depth: (cm) 0.1 Area: (cm) 1.571 Volume: (cm) 0.157 % Reduction in Area: 94.1% % Reduction in Volume: 94.1% Epithelialization: Small (1-33%) Tunneling: No Undermining: No Wound Description Full Thickness Without Exposed Support Foul Odo Classification: Structures Slough/F Wound Margin: Distinct, outline attached Exudate Small Amount: Exudate Type: Serous Exudate Color: amber r After Cleansing: No ibrino Yes Wound Bed Granulation Amount: Small (1-33%) Exposed Structure Granulation Quality: Pink Fascia Exposed: No Necrotic Amount: Medium (34-66%) Fat Layer (Subcutaneous Tissue) Exposed: Yes Necrotic Quality: Adherent Slough Tendon Exposed: No Muscle Exposed: No Joint Exposed: No Odetta PinkKIRCHGESSNER, Fady R. ([id number]) Bone Exposed: No Periwound Skin Texture Texture Color No Abnormalities Noted: No No Abnormalities Noted: No Callus: No Atrophie Blanche: No Crepitus: No Cyanosis: No Excoriation: No Ecchymosis: No Induration: No Erythema: No Rash: No Hemosiderin Staining: No Scarring: Yes Mottled: No Pallor: No Moisture Rubor: No No Abnormalities Noted: No Dry / Scaly: No Temperature / Pain Maceration: No Temperature: No Abnormality Tenderness on Palpation: Yes Wound Preparation Ulcer Cleansing: Rinsed/Irrigated with Saline, Other: soap and water, Topical Anesthetic Applied: Other: lidocaine 4%, Treatment Notes Wound #7 (Left, Medial, Anterior Lower Leg) 1. Cleansed with: Clean wound with Normal Saline 2. Anesthetic Topical Lidocaine 4% cream to wound bed prior to debridement 3. Peri-wound Care: Barrier cream Moisturizing lotion 4. Dressing  Applied: Other dressing (specify in notes) 5. Secondary Dressing Applied Dry Gauze 7. Secured with 3 Layer Compression System - Left Lower Extremity Notes silvercel, unna to anchor Electronic Signature(s) Signed: 08/14/2018 3:41:35 PM By: Rema JasmineNg, Wendi Entered By: Rema JasmineNg, Wendi on 08/14/2018 12:44:01 [name], Taiten R. ([id number]) -------------------------------------------------------------------------------- Vitals Details Patient Name: [name], Triton R. Date of Service: 08/14/2018 12:30 PM Medical Record Number: [id number] Patient Account Number: [id number] Date of Birth/Sex: [birth year](71 y.o. M) Treating RN: Rema JasmineNg, Wendi Primary Care Marquan Vokes: Duncan Dullullo, Teresa Other Clinician: Referring Kaleisha Bhargava: Duncan Dullullo, Teresa Treating Ashly Yepez/Extender: Altamese CarolinaOBSON, MICHAEL G Weeks in Treatment: 3 Vital Signs Time Taken: 12:30 Temperature (F): 98.5 Height (in): 72 Pulse (bpm): 80 Weight (lbs): 293.4 Respiratory Rate (breaths/min): 20 Body Mass Index (BMI): 39.8 Blood Pressure (mmHg): 129/70 Reference Range: 80 - 120 mg / dl Electronic Signature(s) Signed: 08/14/2018 3:41:35 PM By: Rema JasmineNg, Wendi Entered ByRema Jasmine: Ng, Wendi on 08/14/2018 12:33:38

## 2018-08-26 MED ORDER — CLINDAMYCIN PHOSPHATE 600 MG/50ML IV SOLN
600.0000 mg | INTRAVENOUS | Status: AC
  Administered 2018-08-27: 600 mg via INTRAVENOUS

## 2018-08-27 ENCOUNTER — Encounter: Payer: Self-pay | Admitting: *Deleted

## 2018-08-27 ENCOUNTER — Ambulatory Visit: Payer: Medicare HMO | Admitting: Registered Nurse

## 2018-08-27 ENCOUNTER — Ambulatory Visit
Admission: RE | Admit: 2018-08-27 | Discharge: 2018-08-27 | Disposition: A | Discharge status: Home / Self Care | Payer: Medicare HMO | Source: Ambulatory Visit | Attending: Urology | Admitting: Urology

## 2018-08-27 ENCOUNTER — Encounter
Admission: RE | Disposition: A | Discharge status: Home / Self Care | Payer: Self-pay | Source: Ambulatory Visit | Attending: Urology

## 2018-08-27 DIAGNOSIS — I110 Hypertensive heart disease with heart failure: Secondary | ICD-10-CM | POA: Diagnosis not present

## 2018-08-27 DIAGNOSIS — Z9981 Dependence on supplemental oxygen: Secondary | ICD-10-CM | POA: Insufficient documentation

## 2018-08-27 DIAGNOSIS — I509 Heart failure, unspecified: Secondary | ICD-10-CM | POA: Diagnosis not present

## 2018-08-27 DIAGNOSIS — E1151 Type 2 diabetes mellitus with diabetic peripheral angiopathy without gangrene: Secondary | ICD-10-CM | POA: Diagnosis not present

## 2018-08-27 DIAGNOSIS — E785 Hyperlipidemia, unspecified: Secondary | ICD-10-CM | POA: Diagnosis not present

## 2018-08-27 DIAGNOSIS — Z8673 Personal history of transient ischemic attack (TIA), and cerebral infarction without residual deficits: Secondary | ICD-10-CM | POA: Diagnosis not present

## 2018-08-27 DIAGNOSIS — G4733 Obstructive sleep apnea (adult) (pediatric): Secondary | ICD-10-CM | POA: Diagnosis not present

## 2018-08-27 DIAGNOSIS — J449 Chronic obstructive pulmonary disease, unspecified: Secondary | ICD-10-CM | POA: Insufficient documentation

## 2018-08-27 DIAGNOSIS — Z79899 Other long term (current) drug therapy: Secondary | ICD-10-CM | POA: Diagnosis not present

## 2018-08-27 DIAGNOSIS — E119 Type 2 diabetes mellitus without complications: Secondary | ICD-10-CM | POA: Diagnosis not present

## 2018-08-27 DIAGNOSIS — I2510 Atherosclerotic heart disease of native coronary artery without angina pectoris: Secondary | ICD-10-CM | POA: Diagnosis not present

## 2018-08-27 DIAGNOSIS — N492 Inflammatory disorders of scrotum: Secondary | ICD-10-CM | POA: Insufficient documentation

## 2018-08-27 DIAGNOSIS — Z7982 Long term (current) use of aspirin: Secondary | ICD-10-CM | POA: Diagnosis not present

## 2018-08-27 DIAGNOSIS — Z87891 Personal history of nicotine dependence: Secondary | ICD-10-CM | POA: Insufficient documentation

## 2018-08-27 DIAGNOSIS — L723 Sebaceous cyst: Secondary | ICD-10-CM | POA: Diagnosis not present

## 2018-08-27 DIAGNOSIS — Z86718 Personal history of other venous thrombosis and embolism: Secondary | ICD-10-CM | POA: Diagnosis not present

## 2018-08-27 DIAGNOSIS — J441 Chronic obstructive pulmonary disease with (acute) exacerbation: Secondary | ICD-10-CM | POA: Diagnosis not present

## 2018-08-27 DIAGNOSIS — L02214 Cutaneous abscess of groin: Secondary | ICD-10-CM | POA: Diagnosis not present

## 2018-08-27 HISTORY — PX: INCISION AND DRAINAGE ABSCESS: SHX5864

## 2018-08-27 SURGERY — INCISION AND DRAINAGE, ABSCESS
Anesthesia: General | Laterality: Left | Wound class: Dirty or Infected

## 2018-08-27 MED ORDER — GLYCOPYRROLATE 0.2 MG/ML IJ SOLN
INTRAMUSCULAR | Status: DC | PRN
  Administered 2018-08-27: 0.2 mg via INTRAVENOUS

## 2018-08-27 MED ORDER — BUPIVACAINE HCL 0.5 % IJ SOLN
INTRAMUSCULAR | Status: DC | PRN
  Administered 2018-08-27: 2 mL

## 2018-08-27 MED ORDER — LIDOCAINE HCL (PF) 2 % IJ SOLN
INTRAMUSCULAR | Status: AC
  Filled 2018-08-27: qty 10

## 2018-08-27 MED ORDER — PROPOFOL 10 MG/ML IV BOLUS
INTRAVENOUS | Status: DC | PRN
  Administered 2018-08-27: 160 mg via INTRAVENOUS

## 2018-08-27 MED ORDER — FAMOTIDINE 20 MG PO TABS
20.0000 mg | ORAL_TABLET | Freq: Once | ORAL | Status: AC
  Administered 2018-08-27: 20 mg via ORAL

## 2018-08-27 MED ORDER — ONDANSETRON HCL 4 MG/2ML IJ SOLN: 4.0000 mg | Freq: Once | INTRAMUSCULAR | Status: DC | PRN

## 2018-08-27 MED ORDER — ONDANSETRON HCL 4 MG/2ML IJ SOLN
INTRAMUSCULAR | Status: DC | PRN
  Administered 2018-08-27: 4 mg via INTRAVENOUS

## 2018-08-27 MED ORDER — FENTANYL CITRATE (PF) 100 MCG/2ML IJ SOLN: 25.0000 ug | INTRAMUSCULAR | Status: DC | PRN

## 2018-08-27 MED ORDER — LACTATED RINGERS IV SOLN
INTRAVENOUS | Status: DC
  Administered 2018-08-27: 09:00:00 via INTRAVENOUS

## 2018-08-27 MED ORDER — FAMOTIDINE 20 MG PO TABS
ORAL_TABLET | ORAL | Status: AC
  Administered 2018-08-27: 20 mg via ORAL
  Filled 2018-08-27: qty 1

## 2018-08-27 MED ORDER — FENTANYL CITRATE (PF) 100 MCG/2ML IJ SOLN
INTRAMUSCULAR | Status: AC
  Filled 2018-08-27: qty 2

## 2018-08-27 MED ORDER — MIDAZOLAM HCL 2 MG/2ML IJ SOLN
INTRAMUSCULAR | Status: AC
  Filled 2018-08-27: qty 2

## 2018-08-27 MED ORDER — MIDAZOLAM HCL 2 MG/2ML IJ SOLN
INTRAMUSCULAR | Status: DC | PRN
  Administered 2018-08-27: 1 mg via INTRAVENOUS

## 2018-08-27 MED ORDER — PROPOFOL 10 MG/ML IV BOLUS
INTRAVENOUS | Status: AC
  Filled 2018-08-27: qty 20

## 2018-08-27 MED ORDER — LIDOCAINE HCL (PF) 1 % IJ SOLN
INTRAMUSCULAR | Status: AC
  Filled 2018-08-27: qty 30

## 2018-08-27 MED ORDER — FENTANYL CITRATE (PF) 100 MCG/2ML IJ SOLN
INTRAMUSCULAR | Status: DC | PRN
  Administered 2018-08-27: 50 ug via INTRAVENOUS

## 2018-08-27 MED ORDER — CLINDAMYCIN PHOSPHATE 600 MG/50ML IV SOLN
INTRAVENOUS | Status: AC
  Filled 2018-08-27: qty 50

## 2018-08-27 MED ORDER — SUCCINYLCHOLINE CHLORIDE 20 MG/ML IJ SOLN
INTRAMUSCULAR | Status: AC
  Filled 2018-08-27: qty 1

## 2018-08-27 MED ORDER — BUPIVACAINE HCL (PF) 0.5 % IJ SOLN
INTRAMUSCULAR | Status: AC
  Filled 2018-08-27: qty 30

## 2018-08-27 MED ORDER — DEXAMETHASONE SODIUM PHOSPHATE 10 MG/ML IJ SOLN
INTRAMUSCULAR | Status: DC | PRN
  Administered 2018-08-27: 5 mg via INTRAVENOUS

## 2018-08-27 MED ORDER — LIDOCAINE HCL (CARDIAC) PF 100 MG/5ML IV SOSY
PREFILLED_SYRINGE | INTRAVENOUS | Status: DC | PRN
  Administered 2018-08-27: 100 mg via INTRAVENOUS

## 2018-08-27 SURGICAL SUPPLY — 32 items
CANISTER SUCT 1200ML W/VALVE (MISCELLANEOUS) ×3 IMPLANT
CHLORAPREP W/TINT 26ML (MISCELLANEOUS) ×3 IMPLANT
DERMABOND ADVANCED (GAUZE/BANDAGES/DRESSINGS)
DERMABOND ADVANCED .7 DNX12 (GAUZE/BANDAGES/DRESSINGS) IMPLANT
DRAIN PENROSE 1/4X12 LTX (DRAIN) IMPLANT
DRAPE LAPAROTOMY 77X122 PED (DRAPES) ×3 IMPLANT
DRSG GAUZE FLUFF 36X18 (GAUZE/BANDAGES/DRESSINGS) ×3 IMPLANT
ELECT REM PT RETURN 9FT ADLT (ELECTROSURGICAL) ×3
ELECTRODE REM PT RTRN 9FT ADLT (ELECTROSURGICAL) ×1 IMPLANT
GAUZE SPONGE 4X4 12PLY STRL (GAUZE/BANDAGES/DRESSINGS) IMPLANT
GAUZE STRETCH 2X75IN STRL (MISCELLANEOUS) ×3 IMPLANT
GLOVE BIO SURGEON STRL SZ8 (GLOVE) ×6 IMPLANT
GOWN STRL REUS W/ TWL LRG LVL3 (GOWN DISPOSABLE) ×2 IMPLANT
GOWN STRL REUS W/ TWL XL LVL3 (GOWN DISPOSABLE) ×1 IMPLANT
GOWN STRL REUS W/TWL LRG LVL3 (GOWN DISPOSABLE) ×4
GOWN STRL REUS W/TWL XL LVL3 (GOWN DISPOSABLE) ×2
KIT TURNOVER KIT A (KITS) ×3 IMPLANT
LABEL OR SOLS (LABEL) IMPLANT
NEEDLE HYPO 25X1 1.5 SAFETY (NEEDLE) ×3 IMPLANT
NS IRRIG 500ML POUR BTL (IV SOLUTION) ×3 IMPLANT
PACK BASIN MINOR ARMC (MISCELLANEOUS) ×3 IMPLANT
SOL PREP PVP 2OZ (MISCELLANEOUS)
SOLUTION PREP PVP 2OZ (MISCELLANEOUS) IMPLANT
SUPPORETR ATHLETIC LG (MISCELLANEOUS) ×1 IMPLANT
SUPPORTER ATHLETIC LG (MISCELLANEOUS) ×3
SUT ETHILON 3-0 FS-10 30 BLK (SUTURE)
SUT VIC AB 3-0 SH 27 (SUTURE) ×2
SUT VIC AB 3-0 SH 27X BRD (SUTURE) ×1 IMPLANT
SUT VIC AB 4-0 SH 27 (SUTURE) ×2
SUT VIC AB 4-0 SH 27XANBCTRL (SUTURE) ×1 IMPLANT
SUTURE EHLN 3-0 FS-10 30 BLK (SUTURE) IMPLANT
SYR 10ML LL (SYRINGE) ×3 IMPLANT

## 2018-08-27 NOTE — Interval H&P Note (Signed)
History and Physical Interval Note:  Patient states the area has decreased in size but is still draining slightly.  We will attempt to excise the cyst wall. CV: RRR Lungs: Clear  08/27/2018 9:08 AM  Victor Daniels  has presented today for surgery, with the diagnosis of infected sebaceous cyst of left groin  The various methods of treatment have been discussed with the patient and family. After consideration of risks, benefits and other options for treatment, the patient has consented to  Procedure(s): EXCISION AND DRAINAGE of sebaceous cyst (Left) as a surgical intervention .  The patient's history has been reviewed, patient examined, no change in status, stable for surgery.  I have reviewed the patient's chart and labs.  Questions were answered to the patient's satisfaction.     Scott C Stoioff

## 2018-08-27 NOTE — Transfer of Care (Signed)
Immediate Anesthesia Transfer of Care Note  Patient: Victor Daniels  Procedure(s) Performed: Procedure(s): EXCISION AND DRAINAGE of sebaceous cyst (Left)  Patient Location: PACU  Anesthesia Type:General  Level of Consciousness: sedated  Airway & Oxygen Therapy: Patient Spontanous Breathing and Patient connected to face mask oxygen  Post-op Assessment: Report given to RN and Post -op Vital signs reviewed and stable  Post vital signs: Reviewed and stable  Last Vitals:  Vitals:   08/27/18 0806 08/27/18 1022  BP: (!) 121/105 117/71  Pulse: 73 75  Resp: 20 17  Temp: 36.9 C (!) 36.1 C  SpO2: 93% 100%    Complications: No apparent anesthesia complications

## 2018-08-27 NOTE — Op Note (Signed)
Preoperative diagnosis:  1. Left groin abscess  Postoperative diagnosis:  1. Left groin abscess  Procedure: 1. Incision and drainage  Surgeon: Riki Altes, MD  Anesthesia: General  Complications: None  Intraoperative findings: Approximately 1 cm fluctuant area left groin draining purulent material  EBL: Minimal  Specimens: None  Indication: Victor Daniels is a 72 y.o. patient who underwent incision and drainage of a left upper scrotal abscess on 08/01/2018.  He was seen in follow-up in had no further drainage however did have an area of induration in the left hemiscrotum which expressed material consistent with an inclusion cyst.  He was scheduled for excision of inclusion cyst however over the last several weeks has developed persistent drainage of the area with significant decrease in size.  After reviewing the management options for treatment, he elected to proceed with the above surgical procedure(s). We have discussed the potential benefits and risks of the procedure, side effects of the proposed treatment, the likelihood of the patient achieving the goals of the procedure, and any potential problems that might occur during the procedure or recuperation. Informed consent has been obtained.  Description of procedure:  The patient was taken to the operating room and general anesthesia was induced.  The patient was placed in the dorsal lithotomy position, prepped and draped in the usual sterile fashion, and preoperative antibiotics were administered. A preoperative time-out was performed.   The area in the left upper scrotum below the inguinal ligament was draining purulent material.  A longitudinal incision was made over the area and cultures were obtained.  Small amount of pus was obtained.  Due to significant inflammation a plane was unable to be developed around the cyst wall to excise.  The majority of the cyst wall was sharply excised piecemeal.  The site was irrigated  with saline.  There was induration above the inguinal ligament however no erythema or fluctuance and the cavity did not communicate with this induration.  The site was packed with gauze and fluffs and scrotal support was applied.  Plan: He will undergo dressing changes in the wound care center and will keep his postop follow-up with me.   Riki Altes, M.D.

## 2018-08-27 NOTE — Discharge Instructions (Signed)
Follow up at wound clinic Thursday sept 5th at 1000 AM for dressing change.

## 2018-08-27 NOTE — Anesthesia Preprocedure Evaluation (Signed)
Anesthesia Evaluation  Patient identified by MRN, date of birth, ID band Patient awake    Reviewed: Allergy & Precautions, NPO status , Patient's Chart, lab work & pertinent test results, reviewed documented beta blocker date and time   Airway Mallampati: III  TM Distance: >3 FB     Dental  (+) Chipped   Pulmonary shortness of breath, sleep apnea , pneumonia, resolved, COPD, former smoker,           Cardiovascular hypertension, Pt. on medications and Pt. on home beta blockers + CAD, + Past MI, + Peripheral Vascular Disease and +CHF       Neuro/Psych  Neuromuscular disease CVA    GI/Hepatic   Endo/Other  diabetes, Type 2  Renal/GU      Musculoskeletal  (+) Arthritis ,   Abdominal   Peds  Hematology  (+) anemia ,   Anesthesia Other Findings Low sats on O2. Obese. Lobectomy. TIA.  Reproductive/Obstetrics                             Anesthesia Physical Anesthesia Plan  ASA: III  Anesthesia Plan: General   Post-op Pain Management:    Induction: Intravenous  PONV Risk Score and Plan:   Airway Management Planned:   Additional Equipment:   Intra-op Plan:   Post-operative Plan:   Informed Consent: I have reviewed the patients History and Physical, chart, labs and discussed the procedure including the risks, benefits and alternatives for the proposed anesthesia with the patient or authorized representative who has indicated his/her understanding and acceptance.     Plan Discussed with: CRNA  Anesthesia Plan Comments:         Anesthesia Quick Evaluation

## 2018-08-27 NOTE — OR Nursing (Signed)
Spoke with Dr Lonna Cobb regarding patients wound consult. Date for dressing change is Thursday September 5th at 1000. Date and time is acceptable per Dr Lonna Cobb.

## 2018-08-27 NOTE — Anesthesia Post-op Follow-up Note (Signed)
Anesthesia QCDR form completed.        

## 2018-08-27 NOTE — Anesthesia Postprocedure Evaluation (Signed)
Anesthesia Post Note  Patient: Victor Daniels  Procedure(s) Performed: EXCISION AND DRAINAGE of sebaceous cyst (Left )  Patient location during evaluation: PACU Anesthesia Type: General Level of consciousness: awake and alert Pain management: pain level controlled Vital Signs Assessment: post-procedure vital signs reviewed and stable Respiratory status: spontaneous breathing, nonlabored ventilation, respiratory function stable and patient connected to nasal cannula oxygen Cardiovascular status: blood pressure returned to baseline and stable Postop Assessment: no apparent nausea or vomiting Anesthetic complications: no     Last Vitals:  Vitals:   08/27/18 1110 08/27/18 1150  BP: 132/67 130/77  Pulse: 73 83  Resp: 16 18  Temp:    SpO2: 91% 90%    Last Pain:  Vitals:   08/27/18 1150  TempSrc:   PainSc: 0-No pain                 Liora Myles S

## 2018-08-27 NOTE — Anesthesia Procedure Notes (Signed)
Procedure Name: LMA Insertion Date/Time: 08/27/2018 9:29 AM Performed by: Stormy Fabian, CRNA Pre-anesthesia Checklist: Patient identified, Patient being monitored, Timeout performed, Emergency Drugs available and Suction available Patient Re-evaluated:Patient Re-evaluated prior to induction Oxygen Delivery Method: Circle system utilized Preoxygenation: Pre-oxygenation with 100% oxygen Induction Type: IV induction Ventilation: Mask ventilation without difficulty LMA: LMA inserted LMA Size: 5.0 Tube type: Oral Number of attempts: 1 Placement Confirmation: positive ETCO2 and breath sounds checked- equal and bilateral Tube secured with: Tape Dental Injury: Teeth and Oropharynx as per pre-operative assessment

## 2018-08-29 ENCOUNTER — Ambulatory Visit: Payer: Medicare HMO

## 2018-08-29 ENCOUNTER — Encounter: Payer: Medicare HMO | Admitting: Nurse Practitioner

## 2018-08-29 ENCOUNTER — Other Ambulatory Visit: Payer: Self-pay

## 2018-08-29 DIAGNOSIS — N492 Inflammatory disorders of scrotum: Secondary | ICD-10-CM

## 2018-08-29 NOTE — Progress Notes (Signed)
Pt is present in office today for follow-up on a scrotal abscess. Due to insurance regulations, he was unable to be seen at the wound care clinic. Scrotal packing was removed without complications. Sterile techniques were followed and abscess was repacked with sterile packing. Pt tolerated well.   Pt will return to clinic tomorrow (Friday) for repacking of scrotal abscess.

## 2018-08-30 ENCOUNTER — Ambulatory Visit: Payer: Medicare HMO

## 2018-08-30 DIAGNOSIS — N492 Inflammatory disorders of scrotum: Secondary | ICD-10-CM

## 2018-08-30 NOTE — Progress Notes (Signed)
Pt is present in office today for follow-up on a scrotal abscess. Due to insurance regulations, he was unable to be seen at the wound care clinic. Scrotal packing was removed without complications. Sterile techniques were followed and abscess was repacked with sterile packing. Pt tolerated well.   Pt will return to clinic on Monday 09/02/18

## 2018-08-31 LAB — AEROBIC/ANAEROBIC CULTURE W GRAM STAIN (SURGICAL/DEEP WOUND)

## 2018-08-31 LAB — AEROBIC/ANAEROBIC CULTURE (SURGICAL/DEEP WOUND)

## 2018-09-02 ENCOUNTER — Ambulatory Visit: Payer: Medicare HMO

## 2018-09-02 DIAGNOSIS — N492 Inflammatory disorders of scrotum: Secondary | ICD-10-CM

## 2018-09-02 NOTE — Progress Notes (Signed)
Pt is present in office today for follow-up on a scrotal abscess/packing. Scrotal packing was removed without complications. Sterile techniques were followed and abscess was repacked with sterile packing. Pt tolerated well.   Pt will return to clinic tomorrow (09/03/2018) for repacking of scrotal abscess.

## 2018-09-03 ENCOUNTER — Ambulatory Visit: Payer: Medicare HMO

## 2018-09-03 DIAGNOSIS — N492 Inflammatory disorders of scrotum: Secondary | ICD-10-CM

## 2018-09-03 NOTE — Progress Notes (Signed)
Pt is present in office today for follow-up on a scrotal abscess/packing. Scrotal packing was removed without complications. Sterile techniques were followed and abscess was repacked with sterile packing. Pt tolerated well.   Pt will return to clinic tomorrow (09/04/2018) for repacking of scrotal abscess.

## 2018-09-04 ENCOUNTER — Ambulatory Visit: Payer: Medicare HMO | Admitting: Internal Medicine

## 2018-09-04 ENCOUNTER — Ambulatory Visit: Payer: Medicare HMO

## 2018-09-04 DIAGNOSIS — N492 Inflammatory disorders of scrotum: Secondary | ICD-10-CM

## 2018-09-04 NOTE — Progress Notes (Signed)
Pt is present in office today for follow-up on a scrotal abscess/packing. Scrotal packing was removed without complications. Sterile techniques were followed and abscess was repacked with sterile packing. Pt tolerated well.   Pt will return to clinic tomorrow (09/05/2018) for repacking of scrotal abscess.

## 2018-09-05 ENCOUNTER — Ambulatory Visit (INDEPENDENT_AMBULATORY_CARE_PROVIDER_SITE_OTHER): Payer: Medicare HMO

## 2018-09-05 DIAGNOSIS — N492 Inflammatory disorders of scrotum: Secondary | ICD-10-CM

## 2018-09-05 NOTE — Progress Notes (Signed)
Abscess packing:  Pt is present in office today for follow-up on a scrotal abscess/packing. Sterile techniques were followed and abscess was repacked with sterile packing. Pt tolerated well.   Will return on Monday to reaccess unless packing falls out tomorrow, patient will then call and come in tomorrow.

## 2018-09-06 ENCOUNTER — Ambulatory Visit: Payer: Medicare HMO | Admitting: Physician Assistant

## 2018-09-06 DIAGNOSIS — J449 Chronic obstructive pulmonary disease, unspecified: Secondary | ICD-10-CM | POA: Diagnosis not present

## 2018-09-09 ENCOUNTER — Ambulatory Visit: Payer: Medicare HMO

## 2018-09-11 ENCOUNTER — Ambulatory Visit: Payer: Medicare HMO | Admitting: Internal Medicine

## 2018-09-11 ENCOUNTER — Ambulatory Visit: Payer: Medicare HMO | Admitting: Urology

## 2018-09-16 ENCOUNTER — Ambulatory Visit (INDEPENDENT_AMBULATORY_CARE_PROVIDER_SITE_OTHER): Payer: Medicare HMO | Admitting: Internal Medicine

## 2018-09-16 ENCOUNTER — Encounter: Payer: Self-pay | Admitting: Internal Medicine

## 2018-09-16 VITALS — BP 124/72 | HR 77 | Temp 98.1°F | Resp 17 | Ht 72.0 in | Wt 285.0 lb

## 2018-09-16 DIAGNOSIS — N492 Inflammatory disorders of scrotum: Secondary | ICD-10-CM

## 2018-09-16 DIAGNOSIS — I10 Essential (primary) hypertension: Secondary | ICD-10-CM | POA: Diagnosis not present

## 2018-09-16 DIAGNOSIS — E1159 Type 2 diabetes mellitus with other circulatory complications: Secondary | ICD-10-CM | POA: Diagnosis not present

## 2018-09-16 DIAGNOSIS — J432 Centrilobular emphysema: Secondary | ICD-10-CM

## 2018-09-16 DIAGNOSIS — J449 Chronic obstructive pulmonary disease, unspecified: Secondary | ICD-10-CM | POA: Diagnosis not present

## 2018-09-16 LAB — CBC WITH DIFFERENTIAL/PLATELET
Basophils Absolute: 0.1 10*3/uL (ref 0.0–0.1)
Basophils Relative: 0.8 % (ref 0.0–3.0)
Eosinophils Absolute: 0.4 10*3/uL (ref 0.0–0.7)
Eosinophils Relative: 6.2 % — ABNORMAL HIGH (ref 0.0–5.0)
HCT: 37.4 % — ABNORMAL LOW (ref 39.0–52.0)
Hemoglobin: 12.5 g/dL — ABNORMAL LOW (ref 13.0–17.0)
LYMPHS ABS: 1.9 10*3/uL (ref 0.7–4.0)
Lymphocytes Relative: 26.9 % (ref 12.0–46.0)
MCHC: 33.4 g/dL (ref 30.0–36.0)
MCV: 88.4 fl (ref 78.0–100.0)
MONO ABS: 0.6 10*3/uL (ref 0.1–1.0)
MONOS PCT: 8.2 % (ref 3.0–12.0)
NEUTROS ABS: 4.1 10*3/uL (ref 1.4–7.7)
NEUTROS PCT: 57.9 % (ref 43.0–77.0)
PLATELETS: 155 10*3/uL (ref 150.0–400.0)
RBC: 4.22 Mil/uL (ref 4.22–5.81)
RDW: 14.6 % (ref 11.5–15.5)
WBC: 7.1 10*3/uL (ref 4.0–10.5)

## 2018-09-16 LAB — COMPREHENSIVE METABOLIC PANEL
ALT: 14 U/L (ref 0–53)
AST: 14 U/L (ref 0–37)
Albumin: 3.8 g/dL (ref 3.5–5.2)
Alkaline Phosphatase: 60 U/L (ref 39–117)
BUN: 24 mg/dL — AB (ref 6–23)
CALCIUM: 9.7 mg/dL (ref 8.4–10.5)
CO2: 38 mEq/L — ABNORMAL HIGH (ref 19–32)
Chloride: 98 mEq/L (ref 96–112)
Creatinine, Ser: 1.13 mg/dL (ref 0.40–1.50)
GFR: 67.81 mL/min (ref >60.00)
GLUCOSE: 116 mg/dL — AB (ref 70–99)
Potassium: 3.7 mEq/L (ref 3.5–5.1)
SODIUM: 142 meq/L (ref 135–145)
Total Bilirubin: 0.6 mg/dL (ref 0.2–1.2)
Total Protein: 7.1 g/dL (ref 6.0–8.3)

## 2018-09-16 LAB — LIPID PANEL
Cholesterol: 151 mg/dL (ref 0–200)
HDL: 55.5 mg/dL (ref >39.00)
LDL CALC: 78 mg/dL (ref 0–99)
NonHDL: 95.92
Total CHOL/HDL Ratio: 3
Triglycerides: 92 mg/dL (ref 0.0–149.0)
VLDL: 18.4 mg/dL (ref 0.0–40.0)

## 2018-09-16 LAB — HEMOGLOBIN A1C: Hgb A1c MFr Bld: 6.5 % (ref 4.6–6.5)

## 2018-09-16 MED ORDER — MELOXICAM 7.5 MG PO TABS: 7.5000 mg | ORAL_TABLET | Freq: Every day | ORAL | 11 refills | Status: DC

## 2018-09-16 MED ORDER — OXYCODONE HCL 15 MG PO TABS: 15.0000 mg | ORAL_TABLET | Freq: Three times a day (TID) | ORAL | 0 refills | Status: DC | PRN

## 2018-09-16 NOTE — Progress Notes (Signed)
Subjective:  Patient ID: Victor Daniels, male    DOB: 21-Aug-1946  Age: 72 y.o. MRN: [id number]  CC: The primary encounter diagnosis was Type 2 diabetes mellitus with other circulatory complication, without long-term current use of insulin (HCC). Diagnoses of Abscess of scrotal wall, Centrilobular emphysema (HCC), and Essential hypertension were also pertinent to this visit.   HPI Victor Daniels presents for 3 month follow up on diabetes.  Patient has no complaints today.   Last seen in August 5  for scrotal abscess that was diagnosed in ED on Augst 3 and treated with doxycycline .  Empiric  cipro added on Aug 5 for development of foul smelling drainage.  Referred to Urology , had  I & D 10 cc's aspirated  Wound was packed with iodoform. Culture was negative.      Patient is following a low glycemic index diet and taking all prescribed medications regularly without side effects.   He does not check his blood sugars .  Patient is not exercising or  intentionally trying to lose weight .  Patient has had an eye exam in the last 12 months and checks feet regularly for signs of infection.  Patient does not walk barefoot outside,  And denies any numbness tingling or burning in feet. Patient is up to date on all recommended vaccinations   Has not had an a1c since Dec 2018 (ordered in June,  Not done)   Used push mower this weekend   Self propelled  Using oxycodone 3 daily to control shoulder and neck pain   Refuses to use CPAP for OSA  feels the pulmonologist aren't doing anything more than I am doing.  He refuses to use CPAP    Venous ulcers healed  Wearing compression stockings     Lab Results  Component Value Date   HGBA1C 6.5 09/16/2018   Lab Results  Component Value Date   MICROALBUR 0.7 09/16/2018     Outpatient Medications Prior to Visit  Medication Sig Dispense Refill  . albuterol (PROVENTIL HFA;VENTOLIN HFA) 108 (90 Base) MCG/ACT inhaler Inhale 1 puff into the  lungs every 4 (four) hours as needed for wheezing or shortness of breath.    Marland Kitchen amLODipine (NORVASC) 5 MG tablet TAKE 1 TABLET EVERY DAY 90 tablet 1  . arformoterol (BROVANA) 15 MCG/2ML NEBU Take 2 mLs (15 mcg total) by nebulization 2 (two) times daily. DX: COPD  DX Code: J44.9 120 mL 5  . aspirin 81 MG tablet Take 81 mg by mouth daily.    . budesonide (PULMICORT) 0.25 MG/2ML nebulizer solution Take 2 mLs (0.25 mg total) by nebulization 2 (two) times daily. DX: COPD DX Code: J44.9 120 mL 5  . carvedilol (COREG) 12.5 MG tablet TAKE 1 TABLET TWICE DAILY WITH MEALS 180 tablet 1  . docusate sodium (COLACE) 100 MG capsule Take 2 capsules (200 mg total) by mouth daily. (Patient taking differently: Take 100 mg by mouth 2 (two) times daily. ) 60 capsule 2  . furosemide (LASIX) 20 MG tablet Take 1 tablet (20 mg total) by mouth daily as needed. (Patient taking differently: Take 20 mg by mouth every other day. ) 90 tablet 3  . ipratropium (ATROVENT) 0.03 % nasal spray Place 2 sprays into both nostrils 2 (two) times daily. 60 mL 0  . ipratropium-albuterol (DUONEB) 0.5-2.5 (3) MG/3ML SOLN Take 3 mLs by nebulization every 6 (six) hours as needed. (Patient taking differently: Take 3 mLs by nebulization every 6 (six) hours as  needed (wheezing). ) 360 mL 1  . Lactulose 20 GM/30ML SOLN 30 ml every 4 hours until constipation is relieved (Patient taking differently: Take 30 mLs by mouth every 4 (four) hours as needed (constipation). ) 236 mL 3  . losartan-hydrochlorothiazide (HYZAAR) 100-25 MG tablet TAKE 1 TABLET EVERY DAY (Patient taking differently: Take 1 tablet by mouth every other day. ) 90 tablet 3  . methocarbamol (ROBAXIN) 500 MG tablet Take 1 tablet (500 mg total) by mouth every 8 (eight) hours as needed for muscle spasms. 90 tablet 1  . Multiple Vitamin (MULTIVITAMIN) capsule Take 1 capsule by mouth daily.    . nitroGLYCERIN (NITROSTAT) 0.4 MG SL tablet Place 1 tablet (0.4 mg total) under the tongue every 5  (five) minutes as needed for chest pain. Maximum dose 3 tablets 50 tablet 3  . OXYGEN Inhale 2 L into the lungs 3 (three) times daily as needed (shortness of breath).     . simvastatin (ZOCOR) 20 MG tablet Take 1 tablet (20 mg total) by mouth at bedtime. 90 tablet 2  . oxyCODONE (ROXICODONE) 15 MG immediate release tablet Take 1 tablet (15 mg total) by mouth every 8 (eight) hours as needed for pain. 90 tablet 0   No facility-administered medications prior to visit.     Review of Systems;  Patient denies headache, fevers, malaise, unintentional weight loss, skin rash, eye pain, sinus congestion and sinus pain, sore throat, dysphagia,  hemoptysis , cough, dyspnea, wheezing, chest pain, palpitations, orthopnea, edema, abdominal pain, nausea, melena, diarrhea, constipation, flank pain, dysuria, hematuria, urinary  Frequency, nocturia, numbness, tingling, seizures,  Focal weakness, Loss of consciousness,  Tremor, insomnia, depression, anxiety, and suicidal ideation.      Objective:  BP 124/72 (BP Location: Left Arm, Patient Position: Sitting, Cuff Size: Large)   Pulse 77   Temp 98.1 F (36.7 C) (Oral)   Resp 17   Ht 6' (1.829 m)   Wt 285 lb (129.3 kg)   SpO2 92%   BMI 38.65 kg/m   BP Readings from Last 3 Encounters:  09/16/18 124/72  08/27/18 130/77  08/19/18 (!) 155/93    Wt Readings from Last 3 Encounters:  09/16/18 285 lb (129.3 kg)  08/19/18 290 lb (131.5 kg)  08/14/18 289 lb (131.1 kg)    General appearance: alert, cooperative and appears stated age Ears: normal TM's and external ear canals both ears Throat: lips, mucosa, and tongue normal; teeth and gums normal Neck: no adenopathy, no carotid bruit, supple, symmetrical, trachea midline and thyroid not enlarged, symmetric, no tenderness/mass/nodules Back: symmetric, no curvature. ROM normal. No CVA tenderness. Lungs: clear to auscultation bilaterally Heart: regular rate and rhythm, S1, S2 normal, no murmur, click, rub or  gallop Abdomen: soft, non-tender; bowel sounds normal; no masses,  no organomegaly Pulses: 2+ and symmetric Skin: Skin color, texture, turgor normal. No rashes or lesions Lymph nodes: Cervical, supraclavicular, and axillary nodes normal.  Lab Results  Component Value Date   HGBA1C 6.5 09/16/2018   HGBA1C 6.4 12/12/2017   HGBA1C 6.4 09/12/2017    Lab Results  Component Value Date   CREATININE 1.13 09/16/2018   CREATININE 1.18 07/27/2018   CREATININE 1.24 12/12/2017    Lab Results  Component Value Date   WBC 7.1 09/16/2018   HGB 12.5 (L) 09/16/2018   HCT 37.4 (L) 09/16/2018   PLT 155.0 09/16/2018   GLUCOSE 116 (H) 09/16/2018   CHOL 151 09/16/2018   TRIG 92.0 09/16/2018   HDL 55.50 09/16/2018  LDLDIRECT 72.0 12/01/2016   LDLCALC 78 09/16/2018   ALT 14 09/16/2018   AST 14 09/16/2018   NA 142 09/16/2018   K 3.7 09/16/2018   CL 98 09/16/2018   CREATININE 1.13 09/16/2018   BUN 24 (H) 09/16/2018   CO2 38 (H) 09/16/2018   TSH 0.62 06/12/2017   INR 0.9 04/02/2015   HGBA1C 6.5 09/16/2018   MICROALBUR 0.7 09/16/2018    No results found.  Assessment & Plan:   Problem List Items Addressed This Visit    Abscess of scrotal wall    Resolved S/p incision and drainage with iodoform packing       Relevant Orders   CBC with Differential/Platelet (Completed)   COPD (chronic obstructive pulmonary disease) (HCC)    Currently asymptomatic at rest; encouraged to walk daily      Diabetes mellitus with circulatory complication (HCC) - Primary     Remains  well-controlled on diet alone . Patient is up-to-date on eye exams and foot exam is normal today. Patient has no microalbuminuria. Patient is tolerating statin therapy for CAD risk reduction and on ACE/ARB for renal protection and hypertension      Continue asa and statin.  He has no proteinuria and has annual eye exams. He has deferred the final Pneumovax vaccine.   Lab Results  Component Value Date   HGBA1C 6.5 09/16/2018    Lab Results  Component Value Date   MICROALBUR 0.7 09/16/2018         Relevant Orders   Hemoglobin A1c (Completed)   Comprehensive metabolic panel (Completed)   Microalbumin / creatinine urine ratio (Completed)   Lipid panel (Completed)   Hypertension    Well controlled on current regimen  . Lab Results  Component Value Date   CREATININE 1.13 09/16/2018    Lab Results  Component Value Date   NA 142 09/16/2018   K 3.7 09/16/2018   CL 98 09/16/2018   CO2 38 (H) 09/16/2018   . Renal function stable, no changes today.        A total of 25 minutes of face to face time was spent with patient more than half of which was spent in counselling and coordination of care    I am having Tawni Millers. Heart start on meloxicam. I am also having him maintain his aspirin, nitroGLYCERIN, multivitamin, budesonide, arformoterol, furosemide, docusate sodium, methocarbamol, Lactulose, simvastatin, losartan-hydrochlorothiazide, ipratropium-albuterol, OXYGEN, amLODipine, carvedilol, albuterol, ipratropium, oxyCODONE, oxyCODONE, and oxyCODONE.  Meds ordered this encounter  Medications  . meloxicam (MOBIC) 7.5 MG tablet    Sig: Take 1 tablet (7.5 mg total) by mouth daily.    Dispense:  30 tablet    Refill:  11  . oxyCODONE (ROXICODONE) 15 MG immediate release tablet    Sig: Take 1 tablet (15 mg total) by mouth every 8 (eight) hours as needed for pain.    Dispense:  90 tablet    Refill:  0  . oxyCODONE (ROXICODONE) 15 MG immediate release tablet    Sig: Take 1 tablet (15 mg total) by mouth every 8 (eight) hours as needed for pain.    Dispense:  90 tablet    Refill:  0    May refill on or after October 14 2018  . oxyCODONE (ROXICODONE) 15 MG immediate release tablet    Sig: Take 1 tablet (15 mg total) by mouth every 8 (eight) hours as needed for pain.    Dispense:  90 tablet    Refill:  0  May refill on or after November 13 2018    Medications Discontinued During This  Encounter  Medication Reason  . oxyCODONE (ROXICODONE) 15 MG immediate release tablet Reorder    Follow-up: Return in about 3 months (around 12/16/2018) for follow up diabetes.   Sherlene Shamseresa L Keysi Oelkers, MD

## 2018-09-16 NOTE — Patient Instructions (Addendum)
You should use the Brovana and Pulmicort every 12 hours  To keep your bronchial tubes open  You CAN use the ipatropium -albuterol neb  ( called "Duoneb"  ) every 6 hours to dry up a wet cough   I am adding once daily meloxicam for your joint pain,.   Take with food    I have refilled your roxicodone for 3 months   return in 3 months

## 2018-09-17 LAB — MICROALBUMIN / CREATININE URINE RATIO
Creatinine,U: 180.6 mg/dL
Microalb Creat Ratio: 0.4 mg/g (ref 0.0–30.0)
Microalb, Ur: 0.7 mg/dL (ref 0.0–1.9)

## 2018-09-17 NOTE — Assessment & Plan Note (Signed)
Remains  well-controlled on diet alone . Patient is up-to-date on eye exams and foot exam is normal today. Patient has no microalbuminuria. Patient is tolerating statin therapy for CAD risk reduction and on ACE/ARB for renal protection and hypertension      Continue asa and statin.  He has no proteinuria and has annual eye exams. He has deferred the final Pneumovax vaccine.   Lab Results  Component Value Date   HGBA1C 6.5 09/16/2018   Lab Results  Component Value Date   MICROALBUR 0.7 09/16/2018

## 2018-09-17 NOTE — Assessment & Plan Note (Signed)
Currently asymptomatic at rest; encouraged to walk daily

## 2018-09-17 NOTE — Assessment & Plan Note (Signed)
Well controlled on current regimen  . Lab Results  Component Value Date   CREATININE 1.13 09/16/2018    Lab Results  Component Value Date   NA 142 09/16/2018   K 3.7 09/16/2018   CL 98 09/16/2018   CO2 38 (H) 09/16/2018   . Renal function stable, no changes today.

## 2018-09-17 NOTE — Assessment & Plan Note (Signed)
Resolved S/p incision and drainage with iodoform packing

## 2018-09-19 ENCOUNTER — Other Ambulatory Visit: Payer: Self-pay | Admitting: Internal Medicine

## 2018-09-19 MED ORDER — ATORVASTATIN CALCIUM 20 MG PO TABS: 20.0000 mg | ORAL_TABLET | Freq: Every day | ORAL | 3 refills | Status: DC

## 2018-09-23 ENCOUNTER — Ambulatory Visit: Payer: Self-pay | Admitting: Internal Medicine

## 2018-09-26 ENCOUNTER — Encounter: Payer: Self-pay | Admitting: Urology

## 2018-09-26 ENCOUNTER — Ambulatory Visit (INDEPENDENT_AMBULATORY_CARE_PROVIDER_SITE_OTHER): Payer: Medicare HMO | Admitting: Urology

## 2018-09-26 VITALS — BP 136/73 | HR 75 | Ht 72.0 in | Wt 289.1 lb

## 2018-09-26 DIAGNOSIS — M19019 Primary osteoarthritis, unspecified shoulder: Secondary | ICD-10-CM | POA: Insufficient documentation

## 2018-09-26 DIAGNOSIS — Z9889 Other specified postprocedural states: Secondary | ICD-10-CM | POA: Diagnosis not present
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
## 2018-10-02 DIAGNOSIS — H2511 Age-related nuclear cataract, right eye: Secondary | ICD-10-CM | POA: Diagnosis not present

## 2018-10-06 DIAGNOSIS — J449 Chronic obstructive pulmonary disease, unspecified: Secondary | ICD-10-CM | POA: Diagnosis not present

## 2018-10-23 ENCOUNTER — Other Ambulatory Visit: Payer: Self-pay

## 2018-10-23 MED ORDER — SIMVASTATIN 20 MG PO TABS: 20.0000 mg | ORAL_TABLET | Freq: Every day | ORAL | 2 refills | Status: DC

## 2018-10-23 NOTE — Telephone Encounter (Signed)
Refill  Simvastatin done

## 2018-10-23 NOTE — Telephone Encounter (Signed)
Refill request from [facility] for simvastatin 20 mg not on current medication list . Please advise

## 2018-10-30 DIAGNOSIS — Z961 Presence of intraocular lens: Secondary | ICD-10-CM | POA: Diagnosis not present

## 2018-11-01 ENCOUNTER — Encounter: Payer: Self-pay | Admitting: Family Medicine

## 2018-11-01 ENCOUNTER — Ambulatory Visit (INDEPENDENT_AMBULATORY_CARE_PROVIDER_SITE_OTHER): Payer: Medicare HMO | Admitting: Family Medicine

## 2018-11-01 ENCOUNTER — Ambulatory Visit: Payer: Medicare HMO | Admitting: Family Medicine

## 2018-11-01 VITALS — BP 162/88 | HR 76 | Temp 99.1°F | Ht 72.0 in | Wt 289.8 lb

## 2018-11-01 DIAGNOSIS — R0982 Postnasal drip: Secondary | ICD-10-CM | POA: Diagnosis not present

## 2018-11-01 DIAGNOSIS — J029 Acute pharyngitis, unspecified: Secondary | ICD-10-CM | POA: Diagnosis not present

## 2018-11-01 LAB — POC INFLUENZA A&B (BINAX/QUICKVUE)
INFLUENZA A, POC: NEGATIVE
Influenza B, POC: NEGATIVE

## 2018-11-01 LAB — POCT RAPID STREP A (OFFICE): RAPID STREP A SCREEN: NEGATIVE

## 2018-11-01 NOTE — Progress Notes (Signed)
Subjective:    Patient ID: Victor Daniels, male    DOB: 09-May-1946, 72 y.o.   MRN: [id number]  HPI   Patient presents to clinic with sore throat and nasal congestion that woke up with this morning.  Denies fever or chills.  Does feel somewhat fatigued today.  No known sick contacts.  No shortness of breath or cough.  No nausea, vomiting or diarrhea.  Patient Active Problem List   Diagnosis Date Noted  . Degenerative joint disease of shoulder region 09/26/2018  . Abscess of scrotal wall 07/29/2018  . Obstructive sleep apnea 11/22/2016  . Respiratory failure, acute-on-chronic (HCC) 10/05/2016  . Hypogonadism male 07/07/2016  . Obesity 06/03/2016  . Reduced libido 05/11/2016  . Visit for preventive health examination 03/01/2016  . Diabetes mellitus with circulatory complication (HCC) 03/01/2016  . Shoulder pain, left 08/03/2015  . Marital conflict involving estrangement 04/13/2015  . Cervical radiculopathy due to degenerative joint disease of spine 03/24/2015  . Iron deficiency anemia 02/25/2015  . Fatigue 02/25/2015  . Nocturia 02/25/2015  . Pernicious anemia 01/14/2015  . S/P hip replacement 12/12/2014  . COPD exacerbation (HCC) 12/12/2014  . S/p nephrectomy 09/07/2014  . Postoperative state 09/07/2014  . Vertigo, central 08/20/2014  . Senile purpura (HCC) 06/28/2014  . Incomplete emptying of bladder 10/08/2013  . Tracheal deviation 06/19/2013  . Chest pain with high risk for cardiac etiology 04/08/2013  . Transient cerebral ischemia 04/08/2013  . Benign localized prostatic hyperplasia with lower urinary tract symptoms (LUTS) 10/23/2012  . Chronic prostatitis 10/23/2012  . Hematospermia 10/23/2012  . Sciatica 10/23/2012  . Dizziness 07/10/2012  . Venous insufficiency of leg   . History of alcohol abuse   . Vasomotor rhinitis 12/06/2011  . CAD (coronary artery disease) 10/12/2011  . COPD (chronic obstructive pulmonary disease) (HCC)   . CHF with left ventricular  diastolic dysfunction, NYHA class 2 (HCC)   . Hyperlipidemia   . Hypertension    Social History   Tobacco Use  . Smoking status: Former Smoker    Packs/day: 2.00    Years: 25.00    Pack years: 50.00    Types: Cigarettes    Last attempt to quit: 09/26/2009    Years since quitting: 9.1  . Smokeless tobacco: Never Used  Substance Use Topics  . Alcohol use: Yes    Alcohol/week: 1.0 standard drinks    Types: 1 Shots of liquor per week    Comment: OCC Beer     Review of Systems  Constitutional: Negative for chills, fatigue and fever.  HENT:+congestion, sinus pain and sore throat.   Eyes: Negative.   Respiratory: Negative for cough, shortness of breath and wheezing.   Cardiovascular: Negative for chest pain, palpitations and leg swelling.  Gastrointestinal: Negative for abdominal pain, diarrhea, nausea and vomiting.  Genitourinary: Negative for dysuria, frequency and urgency.  Musculoskeletal: Negative for arthralgias and myalgias.  Skin: Negative for color change, pallor and rash.  Neurological: Negative for syncope, light-headedness and headaches.  Psychiatric/Behavioral: The patient is not nervous/anxious.       Objective:   Physical Exam  Constitutional: He is oriented to person, place, and time.  Non-toxic appearance. No distress.  HENT:  Head: Normocephalic and atraumatic.  Right Ear: Tympanic membrane and ear canal normal.  Left Ear: Tympanic membrane and ear canal normal.  Mouth/Throat: Uvula is midline and mucous membranes are normal.  No exudate on tonsils. No swollen tonsils. +post nasal drip.  Neck: Neck supple.  Cardiovascular: Normal rate, regular  rhythm and normal heart sounds.  Pulmonary/Chest: Effort normal and breath sounds normal. No respiratory distress. He has no wheezes. He has no rhonchi. He has no rales.  Lymphadenopathy:    He has no cervical adenopathy.  Neurological: He is alert and oriented to person, place, and time.  Skin: Skin is warm and  dry. No pallor.  Psychiatric: He has a normal mood and affect. His behavior is normal.  Nursing note and vitals reviewed.     Vitals:   11/01/18 1033  BP: (!) 162/88  Pulse: 76  Temp: 99.1 F (37.3 C)  SpO2: 91%   Assessment & Plan:   Viral pharyngitis, sore throat, postnasal drip - rapid flu and rapid strep testing are negative in clinic.  Suspect patient's symptoms are related to a viral pharyngitis.  Advised to do salt water gargles, increase fluids, get good rest.  Advised to use Tylenol as needed for pain.  Also suggest that he can do Chloraseptic throat spray to call for help pain and also take 10 mg of loratadine once daily to dry up postnasal drip.  Advised that viruses often take 7 to 10 days to fully resolve, and also advised that antibiotics do not kill viruses.  Patient verbalized understanding.  Keep regularly scheduled follow-up with PCP as planned.  Return to clinic sooner if any issues arise.

## 2018-11-01 NOTE — Patient Instructions (Signed)
Rapid flu and rapid strep are both negative in clinic.  Your symptoms are related to a viral pharyngitis, which is a viral sore throat illness.  You can do things like salt water gargles, increase fluids, get good rest to help calm symptoms.  He can use Tylenol as needed for pain.  Over-the-counter Chloraseptic throat spray can help calm pain in back of throat, and over-the-counter loratadine 10 mg once daily can help dry up nasal congestion.  You can also do saline nasal flushes to reduce congestion.  Viruses take usually 7 to 10 days to fully resolve, your symptoms may get a little worse over the next couple of days, but then will slowly begin to improve.

## 2018-11-03 LAB — CULTURE, UPPER RESPIRATORY
MICRO NUMBER: 91348021
SPECIMEN QUALITY: ADEQUATE

## 2018-11-06 DIAGNOSIS — J449 Chronic obstructive pulmonary disease, unspecified: Secondary | ICD-10-CM | POA: Diagnosis not present

## 2018-11-13 DIAGNOSIS — J449 Chronic obstructive pulmonary disease, unspecified: Secondary | ICD-10-CM | POA: Diagnosis not present

## 2018-11-18 ENCOUNTER — Ambulatory Visit: Payer: Medicare HMO | Admitting: Pulmonary Disease

## 2018-12-06 DIAGNOSIS — J449 Chronic obstructive pulmonary disease, unspecified: Secondary | ICD-10-CM | POA: Diagnosis not present

## 2018-12-19 ENCOUNTER — Other Ambulatory Visit: Payer: Self-pay

## 2018-12-19 MED ORDER — ATORVASTATIN CALCIUM 20 MG PO TABS: 20.0000 mg | ORAL_TABLET | Freq: Every day | ORAL | 1 refills | Status: DC

## 2018-12-20 ENCOUNTER — Ambulatory Visit (INDEPENDENT_AMBULATORY_CARE_PROVIDER_SITE_OTHER): Payer: Medicare HMO | Admitting: Internal Medicine

## 2018-12-20 ENCOUNTER — Encounter: Payer: Self-pay | Admitting: Internal Medicine

## 2018-12-20 VITALS — BP 132/76 | HR 76 | Temp 98.3°F | Resp 16 | Ht 72.0 in | Wt 291.2 lb

## 2018-12-20 DIAGNOSIS — Z23 Encounter for immunization: Secondary | ICD-10-CM

## 2018-12-20 DIAGNOSIS — M4722 Other spondylosis with radiculopathy, cervical region: Secondary | ICD-10-CM

## 2018-12-20 DIAGNOSIS — E1159 Type 2 diabetes mellitus with other circulatory complications: Secondary | ICD-10-CM | POA: Diagnosis not present

## 2018-12-20 LAB — POCT GLYCOSYLATED HEMOGLOBIN (HGB A1C): Hemoglobin A1C: 5.5 % (ref 4.0–5.6)

## 2018-12-20 MED ORDER — OXYCODONE HCL 15 MG PO TABS: 15.0000 mg | ORAL_TABLET | Freq: Three times a day (TID) | ORAL | 0 refills | Status: DC | PRN

## 2018-12-20 MED ORDER — LACTULOSE 20 GM/30ML PO SOLN: ORAL | 3 refills | Status: DC

## 2018-12-20 MED ORDER — IPRATROPIUM-ALBUTEROL 0.5-2.5 (3) MG/3ML IN SOLN: 3.0000 mL | Freq: Four times a day (QID) | RESPIRATORY_TRACT | 1 refills | Status: DC | PRN

## 2018-12-20 NOTE — Patient Instructions (Signed)
Be careful about the sugar in fruit cocktail: rinse the syrup off the fruit if you want to eat it daily  Victor Daniels now makes 2  frozen breakfast items that are low carb and microwaveable in 2 minutes or less:  The  Frittata    The  "Egg'which"  . Victor Daniels(Frittatas are similar to quiches without the crust)  Victor Daniels also makes a microwaveable omelet called "Just Crack an Egg"  Which you can find in the egg section   You received the last pneumonia vaccine and your annual  Influenza   vaccine today   See you in 3 months

## 2018-12-20 NOTE — Progress Notes (Signed)
Subjective:  Patient ID: Victor Daniels, male    DOB: 11-22-1946  Age: 72 y.o. MRN: [id number]  CC: The primary encounter diagnosis was Type 2 diabetes mellitus with other circulatory complication, without long-term current use of insulin (HCC). Diagnoses of Encounter for immunization, Need for 23-polyvalent pneumococcal polysaccharide vaccine, and Cervical radiculopathy due to degenerative joint disease of spine were also pertinent to this visit.  HPI Victor Daniels presents for 3 month follow up on diabetes and other issues   Treated for viral  one month .  ago.  No abx used.  .  Patient has no complaints today.  Patient is following a low glycemic index diet about 50% of the time and taking all prescribed medications regularly without side effects.  Fasting sugars have been under less than 140 most of the time and post prandials have been under 160 except on rare occasions. Patient is exercising about 3 times per week and intentionally trying to lose weight .  Patient has had an eye exam in the last 12 months and checks feet regularly for signs of infection.  Patient does not walk barefoot outside,  And denies an numbness tingling or burning in feet  bt having pain and tingling in both hands for years,  currentl left hand is worse than right hand . At times will radiate up to elbow . Does not want to pursue workup . Does not trust surgeons due to prior cervical spine surgery 15 years ago by [facility] Quint in [place] that "made everything  worse"  Has limited extension of neck numbness of the platysma.   Eating fruit cocktail  Daily .   Bowels moving regularly.  Breakfast is a Location manager breakfast croissant   chronic pain:  He has multiple joints affected by advanced  degenerative changes,  Including right shoulder anc ervical spine With prior ailed surgeries   roxicodone refilled for 3 moths    Outpatient Medications Prior to Visit  Medication Sig Dispense Refill  . albuterol (PROVENTIL  HFA;VENTOLIN HFA) 108 (90 Base) MCG/ACT inhaler Inhale 1 puff into the lungs every 4 (four) hours as needed for wheezing or shortness of breath.    Marland Kitchen amLODipine (NORVASC) 5 MG tablet TAKE 1 TABLET EVERY DAY 90 tablet 1  . arformoterol (BROVANA) 15 MCG/2ML NEBU Take 2 mLs (15 mcg total) by nebulization 2 (two) times daily. DX: COPD  DX Code: J44.9 120 mL 5  . aspirin 81 MG tablet Take 81 mg by mouth daily.    Marland Kitchen atorvastatin (LIPITOR) 20 MG tablet Take 1 tablet (20 mg total) by mouth daily. 90 tablet 1  . budesonide (PULMICORT) 0.25 MG/2ML nebulizer solution Take 2 mLs (0.25 mg total) by nebulization 2 (two) times daily. DX: COPD DX Code: J44.9 120 mL 5  . carvedilol (COREG) 12.5 MG tablet TAKE 1 TABLET TWICE DAILY WITH MEALS 180 tablet 1  . docusate sodium (COLACE) 100 MG capsule Take 2 capsules (200 mg total) by mouth daily. (Patient taking differently: Take 100 mg by mouth 2 (two) times daily. ) 60 capsule 2  . ipratropium (ATROVENT) 0.03 % nasal spray Place 2 sprays into both nostrils 2 (two) times daily. 60 mL 0  . losartan-hydrochlorothiazide (HYZAAR) 100-25 MG tablet TAKE 1 TABLET EVERY DAY (Patient taking differently: Take 1 tablet by mouth every other day. ) 90 tablet 3  . meloxicam (MOBIC) 7.5 MG tablet Take 1 tablet (7.5 mg total) by mouth daily. 30 tablet 11  . methocarbamol (ROBAXIN) 500  MG tablet Take 1 tablet (500 mg total) by mouth every 8 (eight) hours as needed for muscle spasms. 90 tablet 1  . Multiple Vitamin (MULTIVITAMIN) capsule Take 1 capsule by mouth daily.    . nitroGLYCERIN (NITROSTAT) 0.4 MG SL tablet Place 1 tablet (0.4 mg total) under the tongue every 5 (five) minutes as needed for chest pain. Maximum dose 3 tablets 50 tablet 3  . OXYGEN Inhale 2 L into the lungs 3 (three) times daily as needed (shortness of breath).     . simvastatin (ZOCOR) 20 MG tablet Take 1 tablet (20 mg total) by mouth at bedtime. 90 tablet 2  . ipratropium-albuterol (DUONEB) 0.5-2.5 (3) MG/3ML SOLN  Take 3 mLs by nebulization every 6 (six) hours as needed. (Patient taking differently: Take 3 mLs by nebulization every 6 (six) hours as needed (wheezing). ) 360 mL 1  . Lactulose 20 GM/30ML SOLN 30 ml every 4 hours until constipation is relieved (Patient taking differently: Take 30 mLs by mouth every 4 (four) hours as needed (constipation). ) 236 mL 3  . oxyCODONE (ROXICODONE) 15 MG immediate release tablet Take 1 tablet (15 mg total) by mouth every 8 (eight) hours as needed for pain. 90 tablet 0  . oxyCODONE (ROXICODONE) 15 MG immediate release tablet Take 1 tablet (15 mg total) by mouth every 8 (eight) hours as needed for pain. 90 tablet 0  . oxyCODONE (ROXICODONE) 15 MG immediate release tablet Take 1 tablet (15 mg total) by mouth every 8 (eight) hours as needed for pain. 90 tablet 0  . furosemide (LASIX) 20 MG tablet Take 1 tablet (20 mg total) by mouth daily as needed. (Patient not taking: Reported on 12/20/2018) 90 tablet 3   No facility-administered medications prior to visit.     Review of Systems;  Patient denies headache, fevers, malaise, unintentional weight loss, skin rash, eye pain, sinus congestion and sinus pain, sore throat, dysphagia,  hemoptysis , cough, dyspnea, wheezing, chest pain, palpitations, orthopnea, edema, abdominal pain, nausea, melena, diarrhea, constipation, flank pain, dysuria, hematuria, urinary  Frequency, nocturia, numbness, tingling, seizures,  Focal weakness, Loss of consciousness,  Tremor, insomnia, depression, anxiety, and suicidal ideation.      Objective:  BP 132/76 (BP Location: Left Arm, Patient Position: Sitting, Cuff Size: Normal)   Pulse 76   Temp 98.3 F (36.8 C) (Oral)   Resp 16   Ht 6' (1.829 m)   Wt 291 lb 3.2 oz (132.1 kg)   SpO2 90%   BMI 39.49 kg/m   BP Readings from Last 3 Encounters:  12/20/18 132/76  11/01/18 (!) 162/88  09/26/18 136/73    Wt Readings from Last 3 Encounters:  12/20/18 291 lb 3.2 oz (132.1 kg)  11/01/18 289  lb 12.8 oz (131.5 kg)  09/26/18 289 lb 1.6 oz (131.1 kg)    General appearance: alert, cooperative and appears stated age Ears: normal TM's and external ear canals both ears Throat: lips, mucosa, and tongue normal; teeth and gums normal Neck: no adenopathy, no carotid bruit, supple, symmetrical, trachea midline and thyroid not enlarged, symmetric, no tenderness/mass/nodules Back: symmetric, no curvature. ROM normal. No CVA tenderness. Lungs: clear to auscultation bilaterally Heart: regular rate and rhythm, S1, S2 normal, no murmur, click, rub or gallop Abdomen: soft, non-tender; bowel sounds normal; no masses,  no organomegaly Pulses: 2+ and symmetric Skin: Skin color, texture, turgor normal. No rashes or lesions Lymph nodes: Cervical, supraclavicular, and axillary nodes normal.  Lab Results  Component Value Date  HGBA1C 5.5 12/20/2018   HGBA1C 6.5 09/16/2018   HGBA1C 6.4 12/12/2017    Lab Results  Component Value Date   CREATININE 1.13 09/16/2018   CREATININE 1.18 07/27/2018   CREATININE 1.24 12/12/2017    Lab Results  Component Value Date   WBC 7.1 09/16/2018   HGB 12.5 (L) 09/16/2018   HCT 37.4 (L) 09/16/2018   PLT 155.0 09/16/2018   GLUCOSE 116 (H) 09/16/2018   CHOL 151 09/16/2018   TRIG 92.0 09/16/2018   HDL 55.50 09/16/2018   LDLDIRECT 72.0 12/01/2016   LDLCALC 78 09/16/2018   ALT 14 09/16/2018   AST 14 09/16/2018   NA 142 09/16/2018   K 3.7 09/16/2018   CL 98 09/16/2018   CREATININE 1.13 09/16/2018   BUN 24 (H) 09/16/2018   CO2 38 (H) 09/16/2018   TSH 0.62 06/12/2017   INR 0.9 04/02/2015   HGBA1C 5.5 12/20/2018   MICROALBUR 0.7 09/16/2018    No results found.  Assessment & Plan:   Problem List Items Addressed This Visit    Cervical radiculopathy due to degenerative joint disease of spine    Now with intermittent pain and  numbness occurring in both hands d Unclear whether his current pain is radiculopathy from progressive spinal stenosis, o   Unresolved MSK issues for right shoulder rotator cuff surgery, or VTS bilateral Refill history confirmed via [place] Controlled Substance database, accessed by me today, and 3 months of oxycodone 15 mg  Tid  refills given. #90/month .      Diabetes mellitus with circulatory complication (HCC) - Primary     Remains  well-controlled on diet alone . Patient is up-to-date on eye exams and foot exam is normal today. Patient has no microalbuminuria. Patient is tolerating statin therapy for CAD risk reduction and on ACE/ARB for renal protection and hypertension      Continue asa and statin.  He has no proteinuria and has annual eye exams. He has deferred the final Pneumovax vaccine.   Lab Results  Component Value Date   HGBA1C 5.5 12/20/2018   Lab Results  Component Value Date   MICROALBUR 0.7 09/16/2018         Relevant Orders   POCT HgB A1C (Completed)    Other Visit Diagnoses    Encounter for immunization       Relevant Orders   Flu vaccine HIGH DOSE PF (Completed)   Need for 23-polyvalent pneumococcal polysaccharide vaccine       Relevant Orders   Pneumococcal polysaccharide vaccine 23-valent greater than or equal to 2yo subcutaneous/IM (Completed)      I have changed Victor Daniels ipratropium-albuterol. I am also having him maintain his aspirin, nitroGLYCERIN, multivitamin, budesonide, arformoterol, furosemide, docusate sodium, methocarbamol, losartan-hydrochlorothiazide, OXYGEN, amLODipine, carvedilol, albuterol, ipratropium, meloxicam, simvastatin, atorvastatin, oxyCODONE, oxyCODONE, oxyCODONE, and Lactulose.  Meds ordered this encounter  Medications  . oxyCODONE (ROXICODONE) 15 MG immediate release tablet    Sig: Take 1 tablet (15 mg total) by mouth every 8 (eight) hours as needed for pain.    Dispense:  90 tablet    Refill:  0  . oxyCODONE (ROXICODONE) 15 MG immediate release tablet    Sig: Take 1 tablet (15 mg total) by mouth every 8 (eight) hours as needed for pain.      Dispense:  90 tablet    Refill:  0  . oxyCODONE (ROXICODONE) 15 MG immediate release tablet    Sig: Take 1 tablet (15 mg total) by mouth every 8 (eight)  hours as needed for pain.    Dispense:  90 tablet    Refill:  0  . Lactulose 20 GM/30ML SOLN    Sig: 30 ml every 4 hours until constipation is relieved    Dispense:  236 mL    Refill:  3  . ipratropium-albuterol (DUONEB) 0.5-2.5 (3) MG/3ML SOLN    Sig: Take 3 mLs by nebulization every 6 (six) hours as needed (wheezing).    Dispense:  360 mL    Refill:  1    Medications Discontinued During This Encounter  Medication Reason  . oxyCODONE (ROXICODONE) 15 MG immediate release tablet Reorder  . oxyCODONE (ROXICODONE) 15 MG immediate release tablet Reorder  . oxyCODONE (ROXICODONE) 15 MG immediate release tablet Reorder  . Lactulose 20 GM/30ML SOLN Reorder  . ipratropium-albuterol (DUONEB) 0.5-2.5 (3) MG/3ML SOLN Reorder    Follow-up: No follow-ups on file.   Victor Shamseresa L Johanny Segers, MD

## 2018-12-21 NOTE — Assessment & Plan Note (Signed)
Now with intermittent pain and  numbness occurring in both hands d Unclear whether his current pain is radiculopathy from progressive spinal stenosis, o  Unresolved MSK issues for right shoulder rotator cuff surgery, or VTS bilateral Refill history confirmed via [place] Controlled Substance database, accessed by me today, and 3 months of oxycodone 15 mg  Tid  refills given. #90/month .

## 2018-12-21 NOTE — Assessment & Plan Note (Signed)
Remains  well-controlled on diet alone . Patient is up-to-date on eye exams and foot exam is normal today. Patient has no microalbuminuria. Patient is tolerating statin therapy for CAD risk reduction and on ACE/ARB for renal protection and hypertension      Continue asa and statin.  He has no proteinuria and has annual eye exams. He has deferred the final Pneumovax vaccine.   Lab Results  Component Value Date   HGBA1C 5.5 12/20/2018   Lab Results  Component Value Date   MICROALBUR 0.7 09/16/2018

## 2018-12-24 ENCOUNTER — Other Ambulatory Visit: Payer: Self-pay

## 2018-12-24 MED ORDER — ATORVASTATIN CALCIUM 20 MG PO TABS: 20.0000 mg | ORAL_TABLET | Freq: Every day | ORAL | 1 refills | Status: DC

## 2018-12-31 ENCOUNTER — Telehealth: Payer: Self-pay

## 2018-12-31 NOTE — Telephone Encounter (Signed)
Should not be taking both.  Atorvastatin is stronger so I prefer he take that one

## 2018-12-31 NOTE — Telephone Encounter (Signed)
Received a PA request for pt's atorvastatin 20mg . I see that pt is also taking Simvastatin. Is pt supposed to be taking both medications? Pt states that he is currently taking both the atorvastatin and the simvastatin.

## 2019-01-01 NOTE — Telephone Encounter (Signed)
LMTCB to let pt know that he should not be taking both the simvastatin and the atorvastatin together. Dr. Darrick Huntsman only wants him to take the atorvastatin because it is stronger. [facility] may speak with pt.

## 2019-01-06 DIAGNOSIS — J449 Chronic obstructive pulmonary disease, unspecified: Secondary | ICD-10-CM | POA: Diagnosis not present

## 2019-01-21 ENCOUNTER — Telehealth: Payer: Self-pay | Admitting: Radiology

## 2019-01-21 NOTE — Telephone Encounter (Signed)
Called patient to schedule 6 month post op follow up recommended by Dr Lonna Cobb. Patient states he is having no problems & doesn't feel an office visit is necessary. Patient states he will call back during the summer to schedule prostate exam but does not wish to make any appointments at this time.

## 2019-02-03 ENCOUNTER — Other Ambulatory Visit: Payer: Self-pay | Admitting: Internal Medicine

## 2019-02-03 MED ORDER — AMLODIPINE BESYLATE 5 MG PO TABS: 5.0000 mg | ORAL_TABLET | Freq: Every day | ORAL | 0 refills | Status: DC

## 2019-02-03 NOTE — Telephone Encounter (Signed)
Patient has appointment 03/06/19 Requested Prescriptions  Pending Prescriptions Disp Refills  . amLODipine (NORVASC) 5 MG tablet 90 tablet 0    Sig: Take 1 tablet (5 mg total) by mouth daily.     Cardiovascular:  Calcium Channel Blockers Passed - 02/03/2019 12:52 PM      Passed - Last BP in normal range    BP Readings from Last 1 Encounters:  12/20/18 132/76         Passed - Valid encounter within last 6 months    Recent Outpatient Visits          1 month ago Type 2 diabetes mellitus with other circulatory complication, without long-term current use of insulin (HCC)   [facility] [place] Sherlene Shamsullo, Teresa L, MD   3 months ago Viral pharyngitis   [facility] Guse, Janna ArchLauren M, FNP   4 months ago Type 2 diabetes mellitus with other circulatory complication, without long-term current use of insulin (HCC)   [facility] [place] Sherlene Shamsullo, Teresa L, MD   6 months ago Abscess of scrotal wall   [facility] [place] Sherlene Shamsullo, Teresa L, MD   7 months ago Type 2 diabetes mellitus with other circulatory complication, without long-term current use of insulin (HCC)   [facility] [place] Sherlene Shamsullo, Teresa L, MD      Future Appointments            In 1 month O'Brien-Blaney, Vivianne Spenceenisa L, LPN  [facility] [place], [facility]   In 1 month [name], Mar Daringeresa L, [facility] [place]

## 2019-02-03 NOTE — Telephone Encounter (Signed)
Copied from CRM [id number]. Topic: Quick Communication - Rx Refill/Question >> Feb 03, 2019 11:30 AM Marylen PontoMcneil, Ja-Kwan wrote: Medication: amLODipine (NORVASC) 5 MG tablet  Has the patient contacted their pharmacy? yes   Preferred Pharmacy (with phone number or street name): [facility] [place] - [id number] [phone] (Phone)  [phone] (Fax)  Agent: Please be advised that RX refills may take up to 3 business days. We ask that you follow-up with your pharmacy.

## 2019-02-05 ENCOUNTER — Ambulatory Visit: Payer: Self-pay

## 2019-02-05 ENCOUNTER — Ambulatory Visit: Payer: Medicare HMO

## 2019-02-05 ENCOUNTER — Ambulatory Visit (INDEPENDENT_AMBULATORY_CARE_PROVIDER_SITE_OTHER): Payer: Medicare HMO

## 2019-02-05 DIAGNOSIS — R0602 Shortness of breath: Secondary | ICD-10-CM

## 2019-02-05 DIAGNOSIS — J984 Other disorders of lung: Secondary | ICD-10-CM | POA: Diagnosis not present

## 2019-02-05 NOTE — Telephone Encounter (Signed)
Pt is wanting to know if he can come in for a chest xray today?

## 2019-02-05 NOTE — Telephone Encounter (Signed)
Appt scheduled for tomorrow with Leanora Cover, NP.

## 2019-02-05 NOTE — Telephone Encounter (Signed)
Pt came in for his chest x ray today.

## 2019-02-05 NOTE — Telephone Encounter (Signed)
Chest x ray ordered.  Needs to keep the appointment with Lauren to review the  results

## 2019-02-05 NOTE — Telephone Encounter (Signed)
Pt. Reports he has noticed increasing shortness of breath since January. Last night it "was worse." Is using his O2 - 2L/min. Noticing some wheezing. No availability in office today. Spoke with [employer]. Offered pt. An appointment at another location. Refuses - "I'll be ok until tomorrow - I'd rather be seen in this office." Appointment made for tomorrow. Instructed to go to ED if symptoms worsen.Verbalizes understanding.  Reason for Disposition . [1] MODERATE longstanding difficulty breathing (e.g., speaks in phrases, SOB even at rest, pulse 100-120) AND [2] SAME as normal  Answer Assessment - Initial Assessment Questions 1. RESPIRATORY STATUS: "Describe your breathing?" (e.g., wheezing, shortness of breath, unable to speak, severe coughing)      Shortness of breath with minimal exertion 2. ONSET: "When did this breathing problem begin?"      Started in Jan. 3. PATTERN "Does the difficult breathing come and go, or has it been constant since it started?"      Constant 4. SEVERITY: "How bad is your breathing?" (e.g., mild, moderate, severe)    - MILD: No SOB at rest, mild SOB with walking, speaks normally in sentences, can lay down, no retractions, pulse < 100.    - MODERATE: SOB at rest, SOB with minimal exertion and prefers to sit, cannot lie down flat, speaks in phrases, mild retractions, audible wheezing, pulse 100-120.    - SEVERE: Very SOB at rest, speaks in single words, struggling to breathe, sitting hunched forward, retractions, pulse > 120      Moderate 5. RECURRENT SYMPTOM: "Have you had difficulty breathing before?" If so, ask: "When was the last time?" and "What happened that time?"      Yes 6. CARDIAC HISTORY: "Do you have any history of heart disease?" (e.g., heart attack, angina, bypass surgery, angioplasty)      CHF 7. LUNG HISTORY: "Do you have any history of lung disease?"  (e.g., pulmonary embolus, asthma, emphysema)     Copd 8. CAUSE: "What do you think is causing the  breathing problem?"      Unsure 9. OTHER SYMPTOMS: "Do you have any other symptoms? (e.g., dizziness, runny nose, cough, chest pain, fever)     No 10. PREGNANCY: "Is there any chance you are pregnant?" "When was your last menstrual period?"       n/a 11. TRAVEL: "Have you traveled out of the country in the last month?" (e.g., travel history, exposures)       No  Protocols used: BREATHING DIFFICULTY-A-AH

## 2019-02-05 NOTE — Telephone Encounter (Signed)
LMTCB. PEC may speak with pt. Need to let him know that Dr. Darrick Huntsman has ordered the chest xray and that he come anytime before 4pm to have this done. Pt does not need an appt. Please let pt know that he needs to keep his appt with Leanora Cover, NP for tomorrow to discuss the results.

## 2019-02-06 ENCOUNTER — Encounter: Payer: Self-pay | Admitting: Family Medicine

## 2019-02-06 ENCOUNTER — Ambulatory Visit (INDEPENDENT_AMBULATORY_CARE_PROVIDER_SITE_OTHER): Payer: Medicare HMO | Admitting: Family Medicine

## 2019-02-06 VITALS — BP 144/78 | HR 74 | Temp 98.4°F | Resp 20 | Ht 72.0 in | Wt 296.0 lb

## 2019-02-06 DIAGNOSIS — E782 Mixed hyperlipidemia: Secondary | ICD-10-CM | POA: Diagnosis not present

## 2019-02-06 DIAGNOSIS — E119 Type 2 diabetes mellitus without complications: Secondary | ICD-10-CM | POA: Diagnosis not present

## 2019-02-06 DIAGNOSIS — J441 Chronic obstructive pulmonary disease with (acute) exacerbation: Secondary | ICD-10-CM

## 2019-02-06 DIAGNOSIS — Z6841 Body Mass Index (BMI) 40.0 and over, adult: Secondary | ICD-10-CM | POA: Diagnosis not present

## 2019-02-06 DIAGNOSIS — J449 Chronic obstructive pulmonary disease, unspecified: Secondary | ICD-10-CM | POA: Diagnosis not present

## 2019-02-06 DIAGNOSIS — I509 Heart failure, unspecified: Secondary | ICD-10-CM | POA: Diagnosis not present

## 2019-02-06 MED ORDER — PREDNISONE 10 MG (21) PO TBPK: ORAL_TABLET | ORAL | 0 refills | Status: DC

## 2019-02-06 MED ORDER — SIMVASTATIN 20 MG PO TABS: 20.0000 mg | ORAL_TABLET | Freq: Every day | ORAL | 3 refills | Status: DC

## 2019-02-06 NOTE — Progress Notes (Signed)
Subjective:    Patient ID: Victor Daniels, male    DOB: 08-Feb-1946, [age] y.o.   MRN: [id number]  HPI   Patient presents to clinic due to feeling more SOB than usual over the past 2-3 weeks and having a dry cough. Using mucinex which does seem to help cough and OTC allergy medication loratadine.   No fever or chills. No phlegm with cough. No chest pain. No N/V/D.  He has COPD, chronically on oxygen 2 liters via [place] all of the time. He has been using his nebulizer as prescribed.   CXR done 02/05/2019, reviewed by me. EXAM: CHEST - 2 VIEW  COMPARISON:  Chest CT 06/04/2018  FINDINGS: Cardiomediastinal silhouette is normal. Mediastinal contours appear intact.  There is no evidence of acute focal airspace consolidation, pleural effusion or pneumothorax. There is a stable pleuroparenchymal scarring in the right lung base and hyperinflation of the lungs.  Osseous structures are without acute abnormality. Soft tissues are grossly normal.  IMPRESSION: No active cardiopulmonary disease.  Stable right lung base pleuroparenchymal scarring and hyperinflation of the lungs, chronic findings.  Also needs refill of his simvastatin.  Patient tolerating this medication without any adverse side effects.  Patient Active Problem List   Diagnosis Date Noted  . Degenerative joint disease of shoulder region 09/26/2018  . Obstructive sleep apnea 11/22/2016  . Respiratory failure, acute-on-chronic (HCC) 10/05/2016  . Hypogonadism male 07/07/2016  . Obesity 06/03/2016  . Reduced libido 05/11/2016  . Visit for preventive health examination 03/01/2016  . Diabetes mellitus with circulatory complication (HCC) 03/01/2016  . Shoulder pain, left 08/03/2015  . Marital conflict involving estrangement 04/13/2015  . Cervical radiculopathy due to degenerative joint disease of spine 03/24/2015  . Iron deficiency anemia 02/25/2015  . Fatigue 02/25/2015  . Nocturia 02/25/2015  . Pernicious  anemia 01/14/2015  . S/P hip replacement 12/12/2014  . COPD exacerbation (HCC) 12/12/2014  . S/p nephrectomy 09/07/2014  . Postoperative state 09/07/2014  . Vertigo, central 08/20/2014  . Senile purpura (HCC) 06/28/2014  . Incomplete emptying of bladder 10/08/2013  . Tracheal deviation 06/19/2013  . Chest pain with high risk for cardiac etiology 04/08/2013  . Transient cerebral ischemia 04/08/2013  . Benign localized prostatic hyperplasia with lower urinary tract symptoms (LUTS) 10/23/2012  . Chronic prostatitis 10/23/2012  . Hematospermia 10/23/2012  . Sciatica 10/23/2012  . Dizziness 07/10/2012  . Venous insufficiency of leg   . History of alcohol abuse   . Vasomotor rhinitis 12/06/2011  . CAD (coronary artery disease) 10/12/2011  . COPD (chronic obstructive pulmonary disease) (HCC)   . CHF with left ventricular diastolic dysfunction, NYHA class 2 (HCC)   . Hyperlipidemia   . Hypertension    Social History   Tobacco Use  . Smoking status: Former Smoker    Packs/day: 2.00    Years: 25.00    Pack years: 50.00    Types: Cigarettes    Last attempt to quit: 09/26/2009    Years since quitting: 9.3  . Smokeless tobacco: Never Used  Substance Use Topics  . Alcohol use: Yes    Alcohol/week: 1.0 standard drinks    Types: 1 Shots of liquor per week    Comment: OCC Beer    Review of Systems  Constitutional: Negative for chills, fatigue and fever.  HENT: Negative for congestion, ear pain, sinus pain and sore throat.   Eyes: Negative.   Respiratory:+dry cough, more SOB than usual, some wheezing.   Cardiovascular: Negative for chest pain, palpitations and leg  swelling.  Gastrointestinal: Negative for abdominal pain, diarrhea, nausea and vomiting.  Genitourinary: Negative for dysuria, frequency and urgency.  Musculoskeletal: Negative for arthralgias and myalgias.  Skin: Negative for color change, pallor and rash.  Neurological: Negative for syncope, light-headedness and  headaches.  Psychiatric/Behavioral: The patient is not nervous/anxious.       Objective:   Physical Exam Vitals signs reviewed.  Constitutional:      General: He is not in acute distress.    Appearance: He is not ill-appearing, toxic-appearing or diaphoretic.  HENT:     Head: Normocephalic and atraumatic.     Mouth/Throat:     Mouth: Mucous membranes are moist.     Pharynx: Oropharynx is clear. No oropharyngeal exudate.  Eyes:     Extraocular Movements: Extraocular movements intact.  Neck:     Musculoskeletal: Normal range of motion and neck supple.     Trachea: No tracheal deviation.  Cardiovascular:     Rate and Rhythm: Normal rate and regular rhythm.  Pulmonary:     Effort: Pulmonary effort is normal.     Breath sounds: Decreased breath sounds (diminished throughout) present. No wheezing, rhonchi or rales.     Comments: Wearing O2 via [place] Musculoskeletal:     Right lower leg: No edema.     Left lower leg: No edema.  Lymphadenopathy:     Cervical: No cervical adenopathy.  Skin:    General: Skin is warm and dry.     Coloration: Skin is not pale.  Neurological:     Mental Status: He is alert.    Today's Vitals   02/06/19 0937  BP: (!) 144/78  Pulse: 74  Resp: 20  Temp: 98.4 F (36.9 C)  TempSrc: Oral  SpO2: 91%  Weight: 296 lb (134.3 kg)  Height: 6' (1.829 m)   Body mass index is 40.14 kg/m.     Assessment & Plan:   COPD exacerbation - patient is in no acute respiratory distress.  Chest x-ray is negative for any pneumonia.  He will continue his nebulizers as prescribed, we will take steroid taper to reduce feeling cough/shortness of breath.  Advised he can continue to use Mucinex as needed to calm cough and allergy medicine to reduce allergen triggers.  Advised that if his shortness of breath is not improved with steroid taper or to call office right away for reevaluation.  Hyperlipidemia-simvastatin refill given.  Patient will otherwise keep regularly  scheduled follow-up with PCP as planned.  He will return to clinic sooner if any issues arise.  He is aware he can call office anytime if he has any questions or problems.

## 2019-02-06 NOTE — Patient Instructions (Signed)
CXR done yesterday negative for pneumonia  Continue all your meds as you have been  Use mucinex as needed for cough and take daily allergy medication loratadine 10 mg to help reduce allergens

## 2019-02-10 ENCOUNTER — Other Ambulatory Visit: Payer: Self-pay | Admitting: Internal Medicine

## 2019-02-10 DIAGNOSIS — E782 Mixed hyperlipidemia: Secondary | ICD-10-CM

## 2019-02-10 NOTE — Telephone Encounter (Signed)
Copied from CRM [id number]. Topic: Quick Communication - Rx Refill/Question >> Feb 10, 2019 11:40 AM Burchel, Abbi R wrote: Medication: simvastatin (ZOCOR) 20 MG tablet  Preferred Pharmacy: [facility] - [place], [place] - [address] [place] [address] Phone: [phone] Fax: [phone] Not a 24 hour pharmacy; exact hours not known.

## 2019-02-17 DIAGNOSIS — J449 Chronic obstructive pulmonary disease, unspecified: Secondary | ICD-10-CM | POA: Diagnosis not present

## 2019-02-28 ENCOUNTER — Ambulatory Visit: Payer: Medicare HMO

## 2019-03-03 ENCOUNTER — Other Ambulatory Visit: Payer: Self-pay | Admitting: Internal Medicine

## 2019-03-03 MED ORDER — CARVEDILOL 12.5 MG PO TABS: 12.5000 mg | ORAL_TABLET | Freq: Two times a day (BID) | ORAL | 0 refills | Status: DC

## 2019-03-03 NOTE — Telephone Encounter (Signed)
Copied from CRM [id number]. Topic: Quick Communication - Rx Refill/Question >> Mar 03, 2019 12:10 PM Wyonia Hough E wrote: Medication: carvedilol (COREG) 12.5 MG tablet   Has the patient contacted their pharmacy? no  Preferred Pharmacy (with phone number or street name): [facility] - [place], [place] - [address] [phone] (Phone) [phone] (Fax)    Agent: Please be advised that RX refills may take up to 3 business days. We ask that you follow-up with your pharmacy.

## 2019-03-06 ENCOUNTER — Ambulatory Visit (INDEPENDENT_AMBULATORY_CARE_PROVIDER_SITE_OTHER): Payer: Medicare HMO

## 2019-03-06 ENCOUNTER — Other Ambulatory Visit: Payer: Self-pay

## 2019-03-06 VITALS — BP 116/64 | HR 89 | Temp 98.4°F | Resp 16 | Ht 72.0 in | Wt 296.1 lb

## 2019-03-06 DIAGNOSIS — Z0000 Encounter for general adult medical examination without abnormal findings: Secondary | ICD-10-CM

## 2019-03-06 NOTE — Patient Instructions (Addendum)
  Mr. Victor Daniels , Thank you for taking time to come for your Medicare Wellness Visit. I appreciate your ongoing commitment to your health goals. Please review the following plan we discussed and let me know if I can assist you in the future.   These are the goals we discussed: Goals      Patient Stated   . DIET - REDUCE SUGAR INTAKE (pt-stated)    . Increase physical activity (pt-stated)     Walk for exercise as tolerated       This is a list of the screening recommended for you and due dates:  Health Maintenance  Topic Date Due  . Complete foot exam   03/14/2019  . Cologuard (Stool DNA test)  03/21/2019  . Hemoglobin A1C  06/21/2019  . Eye exam for diabetics  09/05/2019  . Tetanus Vaccine  05/25/2025  . Flu Shot  Completed  .  Hepatitis C: One time screening is recommended by Center for Disease Control  (CDC) for  adults born from [birth year] through 1965.   Completed  . Pneumonia vaccines  Completed
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
## 2019-03-07 DIAGNOSIS — J449 Chronic obstructive pulmonary disease, unspecified: Secondary | ICD-10-CM | POA: Diagnosis not present

## 2019-03-21 ENCOUNTER — Other Ambulatory Visit: Payer: Self-pay | Admitting: Internal Medicine

## 2019-03-22 ENCOUNTER — Other Ambulatory Visit: Payer: Self-pay | Admitting: Internal Medicine

## 2019-03-22 ENCOUNTER — Telehealth: Payer: Self-pay | Admitting: Internal Medicine

## 2019-03-22 NOTE — Telephone Encounter (Signed)
Can pt get a refill on this rx ?

## 2019-03-22 NOTE — Telephone Encounter (Signed)
[facility] pharmacy called requesting change in ventolin to generic or another alternative.  Pharmacy is having trouble getting this medication as brand.  Please advise alternative or change.

## 2019-03-24 NOTE — Telephone Encounter (Signed)
Ventolin is already the generic form of this medication,  So if the patient cannot get it , I can order him a nebulizer and prescribe the liquid ampules.  If he is willing

## 2019-03-24 NOTE — Telephone Encounter (Signed)
LMTCB. Please transfer pt to our office.  

## 2019-03-24 NOTE — Telephone Encounter (Signed)
Refilled: 01/19/2019 Last OV: 02/06/2019 Next OV: 03/28/2019

## 2019-03-25 MED ORDER — OXYCODONE HCL 15 MG PO TABS: 15.0000 mg | ORAL_TABLET | Freq: Three times a day (TID) | ORAL | 0 refills | Status: DC | PRN

## 2019-03-25 NOTE — Telephone Encounter (Signed)
Spoke with pt and he stated that the pharmacy was able to substitute his inhaler for a different one. The pt is needing a refill on the oxycodone.   Refilled: 01/19/2019 Last OV: 12/20/2018 Next OV: 03/28/2019  Pt is also wanting to know if he can cahgne his appt to a telephone visit on Friday 03/28/2019?

## 2019-03-25 NOTE — Telephone Encounter (Signed)
Pt is aware.  

## 2019-03-25 NOTE — Telephone Encounter (Signed)
Oxycodone refilled.

## 2019-03-28 ENCOUNTER — Ambulatory Visit (INDEPENDENT_AMBULATORY_CARE_PROVIDER_SITE_OTHER): Payer: Medicare HMO | Admitting: Internal Medicine

## 2019-03-28 DIAGNOSIS — J431 Panlobular emphysema: Secondary | ICD-10-CM | POA: Diagnosis not present

## 2019-03-28 DIAGNOSIS — M4722 Other spondylosis with radiculopathy, cervical region: Secondary | ICD-10-CM

## 2019-03-28 MED ORDER — DOXYCYCLINE HYCLATE 100 MG PO TABS: 100.0000 mg | ORAL_TABLET | Freq: Two times a day (BID) | ORAL | 0 refills | Status: DC

## 2019-03-28 MED ORDER — IPRATROPIUM BROMIDE 0.03 % NA SOLN: 2.0000 | Freq: Two times a day (BID) | NASAL | 2 refills | Status: DC

## 2019-03-28 MED ORDER — PREDNISONE 10 MG PO TABS: ORAL_TABLET | ORAL | 0 refills | Status: DC

## 2019-03-28 MED ORDER — MORPHINE SULFATE ER 30 MG PO TBCR: EXTENDED_RELEASE_TABLET | ORAL | 0 refills | Status: DC

## 2019-03-28 NOTE — Patient Instructions (Signed)
I am prescribing long acting morphine  For your nighttime pain.  Take one tablet 60 minutes before bedtime.  Do not take the evening dose of oxycodone  Unless you wake up in pain several hours later.  Continue oxycodone 15 mg in the morning and afternoon   Prednisone 60 mg daily for 3 days,  Then begin the tapering dose Wear your mask EACH TIME YOU GO OUTSIDE   Flush your sinuses once daily AFTER YOU HAVE BEEN OUTSIDE Continue a daily antihistamine,  You can increase it to twice daily if it helps

## 2019-03-28 NOTE — Progress Notes (Signed)
Virtual Visit via Telephone Note  I connected with Victor Daniels on 03/30/19 at  2:00 PM EDT by telephone and verified that I am speaking with the correct person using two identifiers.   I discussed the limitations, risks, security and privacy concerns of performing an evaluation and management service by telephone and the availability of in person appointments. I also discussed with the patient that there may be a patient responsible charge related to this service. The patient expressed understanding and agreed to proceed.   History of Present Illness:   73 yr old male with history of advanced COPD>  ischemic cardiomyopathy and chronic back and hip pain with spinal stenosis Presents with non productive cough for the past week.   Currently can't afford the budesonide and aformoterol nebulized medication prescribed by Dr Sung Amabile.  Costing him  about $ 200 month when they were previously free .  Still has duonebs and albuterol nebs.  He denies fevers,  Body aches and productive cough.  He has been observing the [facility] protocols and has not come into contact with anyone,but his wife in 2 weeks,  Not sleeping well due to pain ,  Using oxycodone  15 mg every 8 hours,  Does not feel overly sedated with daytime doses   Observations/Objective:  General: alert, cooperative and does not sound to be in distress  Lungs: breathing pattern is normal and he is not short of breath or coughing    Assessment and Plan:  COPD (chronic obstructive pulmonary disease) Current exacerbation is mild and likely aggravated by pollen count.  Prednisone taper and doxycycline prescribed.   Cervical radiculopathy due to degenerative joint disease of spine Now with intermittent pain and  numbness occurring in both hands d Unclear whether his current pain is radiculopathy from progressive spinal stenosis,  Unresolved MSK issues for right shoulder rotator cuff surgery, or CTS bilateral , Refill history confirmed  via [place] Controlled Substance database, accessed by me today.  His pain is not well controlled at night anctng opioids at bedtime.  Oxycontin not covered , will start with MS Contin and continue short acting oxycodone for the two daytime doses. His last refill on oxycodone was dated March 31.  Will hold off on refills for Aril 30 until MS Contin trial has been done    Updated Medication List Outpatient Encounter Medications as of 03/28/2019  Medication Sig  . amLODipine (NORVASC) 5 MG tablet Take 1 tablet (5 mg total) by mouth daily.  Marland Kitchen arformoterol (BROVANA) 15 MCG/2ML NEBU Take 2 mLs (15 mcg total) by nebulization 2 (two) times daily. DX: COPD  DX Code: J44.9  . aspirin 81 MG tablet Take 81 mg by mouth daily.  . budesonide (PULMICORT) 0.25 MG/2ML nebulizer solution Take 2 mLs (0.25 mg total) by nebulization 2 (two) times daily. DX: COPD DX Code: J44.9  . carvedilol (COREG) 12.5 MG tablet Take 1 tablet (12.5 mg total) by mouth 2 (two) times daily with a meal.  . docusate sodium (COLACE) 100 MG capsule Take 2 capsules (200 mg total) by mouth daily. (Patient taking differently: Take 100 mg by mouth 2 (two) times daily. )  . ipratropium (ATROVENT) 0.03 % nasal spray Place 2 sprays into both nostrils 2 (two) times daily.  Marland Kitchen ipratropium-albuterol (DUONEB) 0.5-2.5 (3) MG/3ML SOLN Take 3 mLs by nebulization every 6 (six) hours as needed (wheezing).  . Lactulose 20 GM/30ML SOLN 30 ml every 4 hours until constipation is relieved  . losartan-hydrochlorothiazide (HYZAAR) 100-25 MG tablet  TAKE 1 TABLET EVERY DAY (Patient taking differently: Take 1 tablet by mouth every other day. )  . meloxicam (MOBIC) 7.5 MG tablet Take 1 tablet (7.5 mg total) by mouth daily.  . methocarbamol (ROBAXIN) 500 MG tablet Take 1 tablet (500 mg total) by mouth every 8 (eight) hours as needed for muscle spasms.  . Multiple Vitamin (MULTIVITAMIN) capsule Take 1 capsule by mouth daily.  . nitroGLYCERIN (NITROSTAT) 0.4 MG SL tablet Place  1 tablet (0.4 mg total) under the tongue every 5 (five) minutes as needed for chest pain. Maximum dose 3 tablets  . oxyCODONE (ROXICODONE) 15 MG immediate release tablet Take 1 tablet (15 mg total) by mouth every 8 (eight) hours as needed for pain.  . OXYGEN Inhale 2 L into the lungs 3 (three) times daily as needed (shortness of breath).   . predniSONE (STERAPRED UNI-PAK 21 TAB) 10 MG (21) TBPK tablet Take according to pack instructions  . simvastatin (ZOCOR) 20 MG tablet Take 1 tablet (20 mg total) by mouth at bedtime.  . VENTOLIN HFA 108 (90 Base) MCG/ACT inhaler INHALE 2 PUFFS EVERY 6 HOURS AS NEEDED FOR WHEEZING OR SHORTNESS OF BREATH  . [DISCONTINUED] ipratropium (ATROVENT) 0.03 % nasal spray Place 2 sprays into both nostrils 2 (two) times daily.  Marland Kitchen doxycycline (VIBRA-TABS) 100 MG tablet Take 1 tablet (100 mg total) by mouth 2 (two) times daily.  . furosemide (LASIX) 20 MG tablet Take 1 tablet (20 mg total) by mouth daily as needed. (Patient not taking: Reported on 03/28/2019)  . morphine (MS CONTIN) 30 MG 12 hr tablet One tablet one hour before bedtime INSTEAD OF OXYCODONE EVENING DOSE  . predniSONE (DELTASONE) 10 MG tablet 6 tablets daily for 3 days ,  then reduce by 1 tablet daily until gone   No facility-administered encounter medications on file as of 03/28/2019.       Follow Up Instructions:    I discussed the assessment and treatment plan with the patient. The patient was provided an opportunity to ask questions and all were answered. The patient agreed with the plan and demonstrated an understanding of the instructions.   The patient was advised to call back or seek an in-person evaluation if the symptoms worsen or if the condition fails to improve as anticipated.  I provided 25 minutes of non-face-to-face time during this encounter.   [name], MD

## 2019-03-30 NOTE — Assessment & Plan Note (Signed)
Now with intermittent pain and  numbness occurring in both hands d Unclear whether his current pain is radiculopathy from progressive spinal stenosis,  Unresolved MSK issues for right shoulder rotator cuff surgery, or CTS bilateral , Refill history confirmed via [place] Controlled Substance database, accessed by me today.  His pain is not well controlled at night anctng opioids at bedtime.  Oxycontin not covered , will start with MS Contin and continue short acting oxycodone for the two daytime doses. His last refill on oxycodone was dated March 31.  Will hold off on refills for Aril 30 until MS Contin trial has been done

## 2019-03-30 NOTE — Assessment & Plan Note (Signed)
Current exacerbation is mild and likely aggravated by pollen count.  Prednisone taper and doxycycline prescribed.

## 2019-04-07 DIAGNOSIS — J449 Chronic obstructive pulmonary disease, unspecified: Secondary | ICD-10-CM | POA: Diagnosis not present

## 2019-04-09 ENCOUNTER — Other Ambulatory Visit: Payer: Self-pay

## 2019-04-11 MED ORDER — VENTOLIN HFA 108 (90 BASE) MCG/ACT IN AERS: INHALATION_SPRAY | RESPIRATORY_TRACT | 2 refills | Status: DC

## 2019-04-26 ENCOUNTER — Other Ambulatory Visit: Payer: Self-pay

## 2019-04-28 ENCOUNTER — Telehealth: Payer: Self-pay | Admitting: Internal Medicine

## 2019-04-28 ENCOUNTER — Telehealth: Payer: Self-pay

## 2019-04-28 ENCOUNTER — Other Ambulatory Visit: Payer: Self-pay

## 2019-04-28 MED ORDER — METHOCARBAMOL 500 MG PO TABS: 500.0000 mg | ORAL_TABLET | Freq: Three times a day (TID) | ORAL | 1 refills | Status: DC | PRN

## 2019-04-28 MED ORDER — OXYCODONE HCL 15 MG PO TABS: 15.0000 mg | ORAL_TABLET | Freq: Three times a day (TID) | ORAL | 0 refills | Status: DC | PRN

## 2019-04-28 NOTE — Telephone Encounter (Signed)
Called and informed pt that medication was sent to pharmacy and I also scheduled a f/up with provider for July.  Just a FYI   nina,cma

## 2019-04-28 NOTE — Telephone Encounter (Signed)
Oxycodone refilled for 3 months . Please schedule a follow up appointment in July

## 2019-04-28 NOTE — Telephone Encounter (Signed)
Refilled: 03/25/2019 Last OV: 03/28/2019 Next OV: 03/08/2020

## 2019-04-28 NOTE — Telephone Encounter (Signed)
Refill sent to pharmacy.  Nina,cma 

## 2019-04-28 NOTE — Telephone Encounter (Signed)
Copied from CRM [id number]. Topic: Quick Communication - Rx Refill/Question >> Apr 28, 2019  8:37 AM Jaquita Rector A wrote: Medication: methocarbamol (ROBAXIN) 500 MG tablet   Has the patient contacted their pharmacy? Yes.   (Agent: If no, request that the patient contact the pharmacy for the refill.) (Agent: If yes, when and what did the pharmacy advise?)  Preferred Pharmacy (with phone number or street name): [facility] - [place], [place] - [address] [phone] (Phone) [phone] (Fax)    Agent: Please be advised that RX refills may take up to 3 business days. We ask that you follow-up with your pharmacy.

## 2019-04-28 NOTE — Telephone Encounter (Signed)
Copied from CRM [id number]. Topic: Quick Communication - Rx Refill/Question >> Apr 28, 2019  8:35 AM Jaquita Rector A wrote: Medication: oxyCODONE (ROXICODONE) 15 MG immediate release tablet   Has the patient contacted their pharmacy? Yes.   (Agent: If no, request that the patient contact the pharmacy for the refill.) (Agent: If yes, when and what did the pharmacy advise?)  Preferred Pharmacy (with phone number or street name): [facility] [address]), [place] - [address] [phone] (Phone) [phone] (Fax)    Agent: Please be advised that RX refills may take up to 3 business days. We ask that you follow-up with your pharmacy.

## 2019-04-28 NOTE — Addendum Note (Signed)
Addended by: [name] on: 04/28/2019 10:13 AM   Modules accepted: Orders

## 2019-04-28 NOTE — Telephone Encounter (Signed)
Pt is requesting a refill of Oxycodone, I refilled the robaxin for him and sent it to the mail order.  Akila Batta,cma

## 2019-05-01 ENCOUNTER — Ambulatory Visit (INDEPENDENT_AMBULATORY_CARE_PROVIDER_SITE_OTHER): Payer: 59 | Admitting: Family Medicine

## 2019-05-01 ENCOUNTER — Other Ambulatory Visit: Payer: Self-pay

## 2019-05-01 ENCOUNTER — Ambulatory Visit: Payer: Self-pay | Admitting: *Deleted

## 2019-05-01 ENCOUNTER — Encounter: Payer: Self-pay | Admitting: Family Medicine

## 2019-05-01 DIAGNOSIS — J441 Chronic obstructive pulmonary disease with (acute) exacerbation: Secondary | ICD-10-CM

## 2019-05-01 MED ORDER — DOXYCYCLINE HYCLATE 100 MG PO TABS: 100.0000 mg | ORAL_TABLET | Freq: Two times a day (BID) | ORAL | 0 refills | Status: DC

## 2019-05-01 MED ORDER — AMLODIPINE BESYLATE 5 MG PO TABS: 5.0000 mg | ORAL_TABLET | Freq: Every day | ORAL | 1 refills | Status: DC

## 2019-05-01 NOTE — Telephone Encounter (Signed)
SOB is normal- patient is wheezing which is new. Patient has COPD and he states he has chronic breathing issues- he has to be treated for wheezing when it occurs and he has started wheezing with cough and congestion. Call to office and they are going to schedule an appointment for him.  Reason for Disposition . [1] Longstanding difficulty breathing (e.g., CHF, COPD, emphysema) AND [2] WORSE than normal  Answer Assessment - Initial Assessment Questions 1. RESPIRATORY STATUS: "Describe your breathing?" (e.g., wheezing, shortness of breath, unable to speak, severe coughing)      Wheezing started in the morning- 3-4 days 2. ONSET: "When did this breathing problem begin?"      3-4 days 3. PATTERN "Does the difficult breathing come and go, or has it been constant since it started?"      COPD- patient is using nebulizer more often- every 3 hours. Patient is having problems with SOB with exertion 4. SEVERITY: "How bad is your breathing?" (e.g., mild, moderate, severe)    - MILD: No SOB at rest, mild SOB with walking, speaks normally in sentences, can lay down, no retractions, pulse < 100.    - MODERATE: SOB at rest, SOB with minimal exertion and prefers to sit, cannot lie down flat, speaks in phrases, mild retractions, audible wheezing, pulse 100-120.    - SEVERE: Very SOB at rest, speaks in single words, struggling to breathe, sitting hunched forward, retractions, pulse > 120      moderate 5. RECURRENT SYMPTOM: "Have you had difficulty breathing before?" If so, ask: "When was the last time?" and "What happened that time?"      COPD 6. CARDIAC HISTORY: "Do you have any history of heart disease?" (e.g., heart attack, angina, bypass surgery, angioplasty)      Heart failure history 7. LUNG HISTORY: "Do you have any history of lung disease?"  (e.g., pulmonary embolus, asthma, emphysema)     COPD 8. CAUSE: "What do you think is causing the breathing problem?"      Cough and congestion- patient is using  OTC mucinex 9. OTHER SYMPTOMS: "Do you have any other symptoms? (e.g., dizziness, runny nose, cough, chest pain, fever)     Cough, wheezing, chest congestion 10. PREGNANCY: "Is there any chance you are pregnant?" "When was your last menstrual period?"       n/a 11. TRAVEL: "Have you traveled out of the country in the last month?" (e.g., travel history, exposures)       No travel  Protocols used: BREATHING DIFFICULTY-A-AH

## 2019-05-01 NOTE — Telephone Encounter (Signed)
?   Appointment with Lauren
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
## 2019-05-07 DIAGNOSIS — J449 Chronic obstructive pulmonary disease, unspecified: Secondary | ICD-10-CM | POA: Diagnosis not present

## 2019-05-09 NOTE — Telephone Encounter (Signed)
error 

## 2019-06-07 DIAGNOSIS — J449 Chronic obstructive pulmonary disease, unspecified: Secondary | ICD-10-CM | POA: Diagnosis not present

## 2019-06-10 ENCOUNTER — Other Ambulatory Visit: Payer: Self-pay | Admitting: Internal Medicine

## 2019-06-24 ENCOUNTER — Telehealth: Payer: Self-pay | Admitting: Internal Medicine

## 2019-06-24 MED ORDER — IPRATROPIUM-ALBUTEROL 0.5-2.5 (3) MG/3ML IN SOLN: 3.0000 mL | Freq: Four times a day (QID) | RESPIRATORY_TRACT | 1 refills | Status: DC | PRN

## 2019-06-24 NOTE — Telephone Encounter (Signed)
Medication has been refilled.

## 2019-06-24 NOTE — Telephone Encounter (Signed)
Pt has appt on 07/22/2019, he will run out of his ipratropium-albuterol (DUONEB) 0.5-2.5 (3) MG/3ML SOLN. [facility] [address] [place]

## 2019-07-03 ENCOUNTER — Ambulatory Visit: Payer: Medicare HMO | Admitting: Internal Medicine

## 2019-07-07 DIAGNOSIS — J449 Chronic obstructive pulmonary disease, unspecified: Secondary | ICD-10-CM | POA: Diagnosis not present

## 2019-07-18 ENCOUNTER — Other Ambulatory Visit: Payer: Self-pay

## 2019-07-22 ENCOUNTER — Encounter: Payer: Self-pay | Admitting: Internal Medicine

## 2019-07-22 ENCOUNTER — Ambulatory Visit (INDEPENDENT_AMBULATORY_CARE_PROVIDER_SITE_OTHER): Payer: Medicare HMO | Admitting: Internal Medicine

## 2019-07-22 ENCOUNTER — Other Ambulatory Visit: Payer: Self-pay

## 2019-07-22 VITALS — BP 152/78 | HR 73 | Temp 98.1°F | Resp 17 | Ht 72.0 in | Wt 300.1 lb

## 2019-07-22 DIAGNOSIS — R200 Anesthesia of skin: Secondary | ICD-10-CM

## 2019-07-22 DIAGNOSIS — J441 Chronic obstructive pulmonary disease with (acute) exacerbation: Secondary | ICD-10-CM | POA: Diagnosis not present

## 2019-07-22 DIAGNOSIS — G4733 Obstructive sleep apnea (adult) (pediatric): Secondary | ICD-10-CM

## 2019-07-22 DIAGNOSIS — E11621 Type 2 diabetes mellitus with foot ulcer: Secondary | ICD-10-CM | POA: Diagnosis not present

## 2019-07-22 DIAGNOSIS — L97521 Non-pressure chronic ulcer of other part of left foot limited to breakdown of skin: Secondary | ICD-10-CM | POA: Diagnosis not present

## 2019-07-22 DIAGNOSIS — E1140 Type 2 diabetes mellitus with diabetic neuropathy, unspecified: Secondary | ICD-10-CM

## 2019-07-22 DIAGNOSIS — E1159 Type 2 diabetes mellitus with other circulatory complications: Secondary | ICD-10-CM

## 2019-07-22 DIAGNOSIS — M4722 Other spondylosis with radiculopathy, cervical region: Secondary | ICD-10-CM

## 2019-07-22 MED ORDER — AZITHROMYCIN 500 MG PO TABS: 500.0000 mg | ORAL_TABLET | Freq: Every day | ORAL | 0 refills | Status: DC

## 2019-07-22 MED ORDER — PREDNISONE 10 MG PO TABS: ORAL_TABLET | ORAL | 0 refills | Status: DC

## 2019-07-22 MED ORDER — MORPHINE SULFATE ER 60 MG PO TBCR: 60.0000 mg | EXTENDED_RELEASE_TABLET | Freq: Every day | ORAL | 0 refills | Status: DC
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
## 2019-07-22 NOTE — Patient Instructions (Addendum)
For your pain :  I am  prescribing MS Contin at the 60 mg dose ( twice as strong  As last time in April) to take at bedtime   Reduce your oxycodone to 2 doses during the day because the MS Contin will replace the evening dose  I will not refill your oxycodone  For until  Early September since you received 90 tablets on July 26   For your hand numbness:  I am making a neurology referral to determine if you hand numbness is coming from Carpal tunnel syndrome ( a pinched nerve in your wrist)  Or from your cervical spine or shoulder     You have  a DIABETIC FOOT ULCER,  I AM REFERRING YOU TO PODIATRY    You are having a COPD exacerbation   If you become more short of breath than you are now,  You need to go to the nearest ER immediately or call 911  I am treating you  with the following:  Prednisone 60 MG DAILY FOR 3 DAYS,  FOLLOWED tapering dose for 5 DAYS    AZITHROMYCIN  once daily  For   7 days (antibiotic) Continue your NEBULIZED MEDICATIONS Take Delsym for cough   Please take a probiotic ( Align, Floraque or Culturelle), or  the generic version of one of these  For a minimum of 3 weeks to prevent a serious antibiotic associated diarrhea  Called clostridium dificile colitis  .  The alternative is to eat a serving of yogurt daily with live cultures.

## 2019-07-23 DIAGNOSIS — L97509 Non-pressure chronic ulcer of other part of unspecified foot with unspecified severity: Secondary | ICD-10-CM | POA: Insufficient documentation

## 2019-07-23 DIAGNOSIS — E1140 Type 2 diabetes mellitus with diabetic neuropathy, unspecified: Secondary | ICD-10-CM | POA: Insufficient documentation

## 2019-07-23 DIAGNOSIS — E11621 Type 2 diabetes mellitus with foot ulcer: Secondary | ICD-10-CM | POA: Insufficient documentation

## 2019-07-23 LAB — COMPREHENSIVE METABOLIC PANEL
ALT: 13 U/L (ref 0–53)
AST: 14 U/L (ref 0–37)
Albumin: 3.7 g/dL (ref 3.5–5.2)
Alkaline Phosphatase: 56 U/L (ref 39–117)
BUN: 20 mg/dL (ref 6–23)
CO2: 40 mEq/L — ABNORMAL HIGH (ref 19–32)
Calcium: 9.6 mg/dL (ref 8.4–10.5)
Chloride: 97 mEq/L (ref 96–112)
Creatinine, Ser: 1.03 mg/dL (ref 0.40–1.50)
GFR: 70.84 mL/min (ref >60.00)
Glucose, Bld: 94 mg/dL (ref 70–99)
Potassium: 3.7 mEq/L (ref 3.5–5.1)
Sodium: 143 mEq/L (ref 135–145)
Total Bilirubin: 0.5 mg/dL (ref 0.2–1.2)
Total Protein: 6.8 g/dL (ref 6.0–8.3)

## 2019-07-23 LAB — CBC WITH DIFFERENTIAL/PLATELET
Basophils Absolute: 0 10*3/uL (ref 0.0–0.1)
Basophils Relative: 0.4 % (ref 0.0–3.0)
Eosinophils Absolute: 0.3 10*3/uL (ref 0.0–0.7)
Eosinophils Relative: 3.5 % (ref 0.0–5.0)
HCT: 36.8 % — ABNORMAL LOW (ref 39.0–52.0)
Hemoglobin: 12 g/dL — ABNORMAL LOW (ref 13.0–17.0)
Lymphocytes Relative: 18.5 % (ref 12.0–46.0)
Lymphs Abs: 1.4 10*3/uL (ref 0.7–4.0)
MCHC: 32.5 g/dL (ref 30.0–36.0)
MCV: 90.7 fl (ref 78.0–100.0)
Monocytes Absolute: 0.6 10*3/uL (ref 0.1–1.0)
Monocytes Relative: 7.4 % (ref 3.0–12.0)
Neutro Abs: 5.3 10*3/uL (ref 1.4–7.7)
Neutrophils Relative %: 70.2 % (ref 43.0–77.0)
Platelets: 224 10*3/uL (ref 150.0–400.0)
RBC: 4.06 Mil/uL — ABNORMAL LOW (ref 4.22–5.81)
RDW: 13.8 % (ref 11.5–15.5)
WBC: 7.5 10*3/uL (ref 4.0–10.5)

## 2019-07-23 LAB — LIPID PANEL
Cholesterol: 143 mg/dL (ref 0–200)
HDL: 43.9 mg/dL (ref >39.00)
LDL Cholesterol: 73 mg/dL (ref 0–99)
NonHDL: 98.94
Total CHOL/HDL Ratio: 3
Triglycerides: 132 mg/dL (ref 0.0–149.0)
VLDL: 26.4 mg/dL (ref 0.0–40.0)

## 2019-07-23 LAB — SEDIMENTATION RATE: Sed Rate: 66 mm/hr — ABNORMAL HIGH (ref 0–20)

## 2019-07-23 LAB — MICROALBUMIN / CREATININE URINE RATIO
Creatinine,U: 74.2 mg/dL
Microalb Creat Ratio: 0.9 mg/g (ref 0.0–30.0)
Microalb, Ur: <0.7 mg/dL (ref 0.0–1.9)

## 2019-07-23 LAB — HEMOGLOBIN A1C: Hgb A1c MFr Bld: 6.2 % (ref 4.6–6.5)

## 2019-07-23 NOTE — Assessment & Plan Note (Signed)
Referral to podiatry for evaluation and treatment of callous

## 2019-07-23 NOTE — Assessment & Plan Note (Signed)
Current exacerbation is mild . he has no known exposure to  COVID and has no ben febrile . Prednisone taper and azithromycin prescribed.

## 2019-07-23 NOTE — Assessment & Plan Note (Signed)
Severe,  AHI 64/hr  .Victor Daniels  He refuses to wear CPAP due to difficulty acclimating despite discussion of the longterm risks

## 2019-07-23 NOTE — Assessment & Plan Note (Addendum)
With prior remote cervical multilevel anterior  Fusion in 2006 . Now with intermittent pain and  numbness occurring in both hands . Unclear whether his current pain is radiculopathy from progressive spinal stenosis,  Unresolved MSK issues for right shoulder rotator cuff surgery, or CTS bilateral , Refill history confirmed via [place] Controlled Substance database, accessed by me today.  His pain is not well controlled at  Night and was not improved with 30 mg Ms contin trial at bedtime. Will increase dose of Ms Contin to 60 mg and continue short acting oxycodone for the two daytime doses.

## 2019-07-24 ENCOUNTER — Other Ambulatory Visit: Payer: Self-pay | Admitting: Internal Medicine

## 2019-07-24 ENCOUNTER — Encounter: Payer: Self-pay | Admitting: Internal Medicine

## 2019-07-24 DIAGNOSIS — D649 Anemia, unspecified: Secondary | ICD-10-CM

## 2019-08-07 DIAGNOSIS — J449 Chronic obstructive pulmonary disease, unspecified: Secondary | ICD-10-CM | POA: Diagnosis not present

## 2019-08-12 ENCOUNTER — Encounter: Payer: Self-pay | Admitting: Podiatry

## 2019-08-12 ENCOUNTER — Ambulatory Visit: Payer: Medicare HMO

## 2019-08-12 ENCOUNTER — Other Ambulatory Visit: Payer: Self-pay

## 2019-08-12 ENCOUNTER — Ambulatory Visit (INDEPENDENT_AMBULATORY_CARE_PROVIDER_SITE_OTHER): Payer: Medicare HMO | Admitting: Podiatry

## 2019-08-12 VITALS — Temp 98.2°F

## 2019-08-12 DIAGNOSIS — I872 Venous insufficiency (chronic) (peripheral): Secondary | ICD-10-CM | POA: Diagnosis not present

## 2019-08-12 DIAGNOSIS — R600 Localized edema: Secondary | ICD-10-CM

## 2019-08-12 DIAGNOSIS — L97522 Non-pressure chronic ulcer of other part of left foot with fat layer exposed: Secondary | ICD-10-CM | POA: Diagnosis not present

## 2019-08-12 DIAGNOSIS — B351 Tinea unguium: Secondary | ICD-10-CM

## 2019-08-12 DIAGNOSIS — E0843 Diabetes mellitus due to underlying condition with diabetic autonomic (poly)neuropathy: Secondary | ICD-10-CM | POA: Diagnosis not present

## 2019-08-12 DIAGNOSIS — M79676 Pain in unspecified toe(s): Secondary | ICD-10-CM

## 2019-08-12 MED ORDER — GENTAMICIN SULFATE 0.1 % EX CREA: 1.0000 "application " | TOPICAL_CREAM | Freq: Two times a day (BID) | CUTANEOUS | 1 refills | Status: DC

## 2019-08-14 NOTE — Progress Notes (Signed)
SUBJECTIVE Patient with a history of diabetes mellitus presents to office today complaining of elongated, thickened nails that cause pain while ambulating in shoes. He is unable to trim his own nails.  He also has a chief complaint of a painful lesion noted to the left great toe that appeared a few weeks ago. He states he saw his PCP on 07/22/2019, three weeks ago. He has not done anything for treatment of the wound. He denies any modifying factors. Patient is here for further evaluation and treatment.   Past Medical History:  Diagnosis Date  . Alcoholic gastritis   . Arthritis   . CAD (coronary artery disease)   . CHF (congestive heart failure) (HCC)    ischemic CM.  EF 25%  . COPD (chronic obstructive pulmonary disease) ([place])   . CVA (cerebral infarction)    residual short term memory loss  . Diabetes mellitus without complication (HCC)    diet controlled  . DVT (deep venous thrombosis) ([place])   . Dyspnea    easily  . Heart attack ([place]) 11/16/09  . History of alcohol abuse 2005   now abstinent for years  . Hyperlipidemia   . Hypertension   . On home oxygen therapy    2 liters continuously  . OSA (obstructive sleep apnea)    not on CPAP  . PAD (peripheral artery disease) ([place])   . Pneumonia    frequent in the past  . Pre-diabetes   . Stroke ([place]) 2010  . Venous insufficiency of leg     OBJECTIVE General Patient is awake, alert, and oriented x 3 and in no acute distress.  Derm Wound #1 noted to the left great toe measuring 0.6 x 0.6 x 0.2 cm.   To the above-noted ulceration, there is no eschar. There is a moderate amount of slough, fibrin and necrotic tissue. Granulation tissue and wound base is red. There is no malodor. There is a minimal amount of serosanginous drainage noted. Periwound integrity is intact.  Skin is dry and supple bilateral. Nails are tender, long, thickened and dystrophic with subungual debris, consistent with onychomycosis, 1-5 bilateral. No signs of  infection noted.  Vasc  Edema noted to the left lower extremity. DP and PT pedal pulses palpable bilaterally. Temperature gradient within normal limits.   Neuro Epicritic and protective threshold sensation diminished bilaterally.   Musculoskeletal Exam No symptomatic pedal deformities noted bilateral. Muscular strength within normal limits.  ASSESSMENT 1. Diabetes Mellitus w/ peripheral neuropathy 2. Onychomycosis of nail due to dermatophyte bilateral 3. Ulceration of the left great toe secondary to diabetes mellitus 4. Edema LLE  PLAN OF CARE 1. Patient evaluated today. 2. Instructed to maintain good pedal hygiene and foot care. Stressed importance of controlling blood sugar.  3. Mechanical debridement of nails 1-5 bilaterally performed using a nail nipper. Filed with dremel without incident.  4. Medically necessary excisional debridement including subcutaneous tissue was performed using a tissue nipper and a chisel blade. Excisional debridement of all the necrotic nonviable tissue down to healthy bleeding viable tissue was performed with post-debridement measurements same as pre-. 5. The wound was cleansed and dry sterile dressing applied. 6. Prescription for Gentamicin cream provided to patient to use daily with a bandage.  7. Unna boot applied to the LLE.  8. Return to clinic in 3 weeks.     Edrick Kins, DPM [facility]  Dr. Edrick Kins, DPM    [address]  [place], [place] [address]                Office [phone]  Fax [phone]

## 2019-08-22 ENCOUNTER — Ambulatory Visit (INDEPENDENT_AMBULATORY_CARE_PROVIDER_SITE_OTHER): Payer: Medicare HMO | Admitting: Internal Medicine

## 2019-08-22 ENCOUNTER — Encounter: Payer: Self-pay | Admitting: Internal Medicine

## 2019-08-22 ENCOUNTER — Other Ambulatory Visit: Payer: Self-pay

## 2019-08-22 VITALS — Temp 98.3°F | Ht 72.0 in | Wt 300.0 lb

## 2019-08-22 DIAGNOSIS — L97501 Non-pressure chronic ulcer of other part of unspecified foot limited to breakdown of skin: Secondary | ICD-10-CM | POA: Diagnosis not present

## 2019-08-22 DIAGNOSIS — J441 Chronic obstructive pulmonary disease with (acute) exacerbation: Secondary | ICD-10-CM | POA: Diagnosis not present

## 2019-08-22 DIAGNOSIS — M4722 Other spondylosis with radiculopathy, cervical region: Secondary | ICD-10-CM

## 2019-08-22 DIAGNOSIS — M19212 Secondary osteoarthritis, left shoulder: Secondary | ICD-10-CM | POA: Diagnosis not present

## 2019-08-22 DIAGNOSIS — E11621 Type 2 diabetes mellitus with foot ulcer: Secondary | ICD-10-CM

## 2019-08-22 MED ORDER — OXYCODONE HCL 15 MG PO TABS: 15.0000 mg | ORAL_TABLET | Freq: Four times a day (QID) | ORAL | 0 refills | Status: DC | PRN

## 2019-08-22 MED ORDER — VENTOLIN HFA 108 (90 BASE) MCG/ACT IN AERS: INHALATION_SPRAY | RESPIRATORY_TRACT | 2 refills | Status: DC

## 2019-08-22 MED ORDER — PREDNISONE 10 MG PO TABS: ORAL_TABLET | ORAL | 0 refills | Status: DC

## 2019-08-22 NOTE — Patient Instructions (Signed)
Stop the MS Contin  I have increased the oxycodone to #120 /month so you can take 2 at night  Repeat prednisone taper x 6 days,  Suspend meloxicam while you  Are on prednisone  Start using the budesonide every 12 hours in your nebulizer after day 3 of prednisone   Referral to Eula Fried , our pharmacist for help with medications for COPD
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
## 2019-08-24 DIAGNOSIS — L97509 Non-pressure chronic ulcer of other part of unspecified foot with unspecified severity: Secondary | ICD-10-CM | POA: Insufficient documentation

## 2019-08-24 DIAGNOSIS — E11621 Type 2 diabetes mellitus with foot ulcer: Secondary | ICD-10-CM | POA: Insufficient documentation

## 2019-08-24 NOTE — Assessment & Plan Note (Signed)
He has constant pain despite prior surgery  Continue meloxicam and oxycodone ;  Increase nighttime dose and stop MS Contin

## 2019-08-24 NOTE — Assessment & Plan Note (Signed)
With prior remote cervical multilevel anterior  Fusion in 2006 . Now with intermittent pain and  numbness occurring in both hands . Unclear whether his current pain is radiculopathy from progressive spinal stenosis,  Unresolved MSK issues for right shoulder rotator cuff surgery, or CTS bilateral , Refill history confirmed via [place] Controlled Substance database, accessed by me today.  His pain is not well controlled at  Night and was not improved with 60 mg Ms contin trial at bedtime. Will increasese  quantity of oxycodone so that he can use it day and night , a total of 4 daily.

## 2019-08-24 NOTE — Assessment & Plan Note (Signed)
Repeating prednisone taper  But not antibiotics.  CCM referral for assistance in obtaining nebulized  Medications

## 2019-08-24 NOTE — Assessment & Plan Note (Signed)
Managed now by Podiatry with debridement and  management of edema with Unnas boot

## 2019-08-25 ENCOUNTER — Ambulatory Visit: Payer: Self-pay | Admitting: Pharmacist

## 2019-08-25 NOTE — Chronic Care Management (AMB) (Signed)
  Chronic Care Management   Note  08/25/2019 Name: NEHEMYAH FOUSHEE MRN: [id number] DOB: 13-Nov-1946  Edson Snowball is a 73 y.o. year old male who is a primary care patient of Derrel Nip, Aris Everts, MD. The CCM team was consulted for assistance with chronic disease management and care coordination needs.    Contacted patient to discuss CCM service and medication cost concerns. Left HIPAA compliant message for patient to return my call at his convenience.   Follow up plan: - If I do not hear back, will outreach again in the next 1-2 weeks  Catie Darnelle Maffucci, PharmD, [place] Pharmacist [facility] [place] [phone]

## 2019-08-28 ENCOUNTER — Ambulatory Visit: Payer: Self-pay | Admitting: Pharmacist

## 2019-08-28 NOTE — Chronic Care Management (AMB) (Signed)
  Chronic Care Management   Note  08/28/2019 Name: IKER NUTTALL MRN: [id number] DOB: 28-Aug-1946  Edson Snowball is a 73 y.o. year old male who is a primary care patient of Derrel Nip, Aris Everts, MD. The CCM team was consulted for assistance with chronic disease management and care coordination needs.    Received return call from patient. When I called him back, I had to leave a voicemail. Asked him to return my call when he was able.   Follow up plan: - If I do not hear back, will outreach again in 1-2 weeks  Catie Darnelle Maffucci, PharmD, [place] Pharmacist [place] [phone]

## 2019-09-02 ENCOUNTER — Ambulatory Visit: Payer: Medicare HMO | Admitting: Podiatry

## 2019-09-02 ENCOUNTER — Encounter: Payer: Self-pay | Admitting: Podiatry

## 2019-09-02 ENCOUNTER — Other Ambulatory Visit: Payer: Self-pay

## 2019-09-02 DIAGNOSIS — E0843 Diabetes mellitus due to underlying condition with diabetic autonomic (poly)neuropathy: Secondary | ICD-10-CM

## 2019-09-02 DIAGNOSIS — L97522 Non-pressure chronic ulcer of other part of left foot with fat layer exposed: Secondary | ICD-10-CM | POA: Diagnosis not present

## 2019-09-04 ENCOUNTER — Ambulatory Visit: Payer: Medicare HMO | Admitting: Pharmacist

## 2019-09-04 DIAGNOSIS — J441 Chronic obstructive pulmonary disease with (acute) exacerbation: Secondary | ICD-10-CM

## 2019-09-04 DIAGNOSIS — J431 Panlobular emphysema: Secondary | ICD-10-CM

## 2019-09-04 MED ORDER — ARFORMOTEROL TARTRATE 15 MCG/2ML IN NEBU: 2.0000 mL | INHALATION_SOLUTION | Freq: Two times a day (BID) | RESPIRATORY_TRACT | 5 refills | Status: DC

## 2019-09-04 MED ORDER — BUDESONIDE 0.25 MG/2ML IN SUSP: 0.2500 mg | Freq: Two times a day (BID) | RESPIRATORY_TRACT | 5 refills | Status: DC

## 2019-09-04 NOTE — Progress Notes (Signed)
   Subjective:  73 y.o. male with PMHx of diabetes mellitus for follow up evaluation of an ulceration of the left great toe. He states he is doing well. He denies any significant pain or modifying factors. He has been using Gentamicin as directed. Patient is here for further evaluation and treatment.   Past Medical History:  Diagnosis Date  . Alcoholic gastritis   . Arthritis   . CAD (coronary artery disease)   . CHF (congestive heart failure) (HCC)    ischemic CM.  EF 25%  . COPD (chronic obstructive pulmonary disease) ([place])   . CVA (cerebral infarction)    residual short term memory loss  . Diabetes mellitus without complication (HCC)    diet controlled  . DVT (deep venous thrombosis) ([place])   . Dyspnea    easily  . Heart attack ([place]) 11/16/09  . History of alcohol abuse 2005   now abstinent for years  . Hyperlipidemia   . Hypertension   . On home oxygen therapy    2 liters continuously  . OSA (obstructive sleep apnea)    not on CPAP  . PAD (peripheral artery disease) ([place])   . Pneumonia    frequent in the past  . Pre-diabetes   . Stroke ([place]) 2010  . Venous insufficiency of leg       Objective/Physical Exam General: The patient is alert and oriented x3 in no acute distress.  Dermatology:  Wound #1 noted to the left great toe measuring 0.6 x 0.3 x 0.2 cm (LxWxD).   To the noted ulceration(s), there is no eschar. There is a moderate amount of slough, fibrin, and necrotic tissue noted. Granulation tissue and wound base is red. There is a minimal amount of serosanguineous drainage noted. There is no exposed bone muscle-tendon ligament or joint. There is no malodor. Periwound integrity is intact. Skin is warm, dry and supple bilateral lower extremities.  Vascular: Edema noted to the left lower extremity. Palpable pedal pulses bilaterally. No erythema noted. Capillary refill within normal limits.  Neurological: Epicritic and protective threshold diminished bilaterally.   Musculoskeletal Exam: Range of motion within normal limits to all pedal and ankle joints bilateral. Muscle strength 5/5 in all groups bilateral.   Assessment: 1. Left great toe ulceration secondary to diabetes mellitus 2. diabetes mellitus w/ peripheral neuropathy 3. LLE edema    Plan of Care:  1. Patient was evaluated. 2. medically necessary excisional debridement including subcutaneous tissue was performed using a tissue nipper and a chisel blade. Excisional debridement of all the necrotic nonviable tissue down to healthy bleeding viable tissue was performed with post-debridement measurements same as pre-. 3. the wound was cleansed and dry sterile dressing applied. 4. Continue using Gentamicin cream daily with a bandage.  5. patient is to return to clinic in 3 weeks.   Edrick Kins, DPM [facility]  Dr. Edrick Kins, [address]                                        [place], [place] [address]                Office [phone]  Fax [phone]

## 2019-09-04 NOTE — Chronic Care Management (AMB) (Signed)
Chronic Care Management   Note  09/04/2019 Name: Victor Daniels MRN: [id number] DOB: 07-12-1946   Subjective:  Victor Daniels is a 73 y.o. year old male who is a primary care patient of [place], Aris Everts, MD. The CCM team was consulted for assistance with chronic disease management and care coordination needs.     Victor Daniels was given information about Chronic Care Management services today including:  1. CCM service includes personalized support from designated clinical staff supervised by his physician, including individualized plan of care and coordination with other care providers 2. 24/7 contact phone numbers for assistance for urgent and routine care needs. 3. Service will only be billed when office clinical staff spend 20 minutes or more in a month to coordinate care. 4. Only one practitioner may furnish and bill the service in a calendar month. 5. The patient may stop CCM services at any time (effective at the end of the month) by phone call to the office staff. 6. The patient will be responsible for cost sharing (co-pay) of up to 20% of the service fee (after annual deductible is met).  Patient agreed to services and verbal consent obtained.   Review of patient status, including review of consultants reports, laboratory and other test data, was performed as part of comprehensive evaluation and provision of chronic care management services.   Objective:  Lab Results  Component Value Date   CREATININE 1.03 07/22/2019   CREATININE 1.13 09/16/2018   CREATININE 1.18 07/27/2018    Lab Results  Component Value Date   HGBA1C 6.2 07/22/2019       Component Value Date/Time   CHOL 143 07/22/2019 1600   TRIG 132.0 07/22/2019 1600   HDL 43.90 07/22/2019 1600   CHOLHDL 3 07/22/2019 1600   VLDL 26.4 07/22/2019 1600   LDLCALC 73 07/22/2019 1600   LDLDIRECT 72.0 12/01/2016 1459    Clinical ASCVD: No - but CAD The 10-year ASCVD risk score Mikey Bussing DC Jr., et  al., 2013) is: 47.5%   Values used to calculate the score:     Age: 63 years     Sex: Male     Is Non-Hispanic African American: No     Diabetic: Yes     Tobacco smoker: No     Systolic Blood Pressure: 580 mmHg     Is BP treated: Yes     HDL Cholesterol: 43.9 mg/dL     Total Cholesterol: 143 mg/dL    BP Readings from Last 3 Encounters:  07/22/19 (!) 152/78  03/28/19 120/72  03/06/19 116/64    No Known Allergies  Medications Reviewed Today    Reviewed by De Hollingshead, [place] (Pharmacist) on 09/04/19 at [place] List Status: <None>  Medication Order Taking? Sig Documenting Provider Last Dose Status Informant  amLODipine (NORVASC) 5 MG tablet [id number] Yes Take 1 tablet (5 mg total) by mouth daily. Jodelle Green, FNP Taking Active   arformoterol [facility]) 15 MCG/2ML NEBU [id number] Yes Take 2 mLs (15 mcg total) by nebulization 2 (two) times daily. DX: COPD  DX Code: J44.9 Wilhelmina Mcardle, MD Taking Active Self  aspirin 81 MG tablet [id number] Yes Take 81 mg by mouth daily. [provider] Taking Active Self  budesonide (PULMICORT) 0.25 MG/2ML nebulizer solution [id number] Yes Take 2 mLs (0.25 mg total) by nebulization 2 (two) times daily. DX: COPD DX Code: J44.9 Wilhelmina Mcardle, MD Taking Active Self  carvedilol (COREG) 12.5 MG tablet [id number] Yes TAKE 1 TABLET  TWICE DAILY WITH MEALS Crecencio Mc, MD Taking Active        Patient not taking:      Discontinued 09/04/19 1604 (Patient Preference)   furosemide (LASIX) 20 MG tablet [id number] Yes Take 1 tablet (20 mg total) by mouth daily as needed. Crecencio Mc, MD Taking Active Self  gentamicin cream (GARAMYCIN) 0.1 % [id number] Yes Apply 1 application topically 2 (two) times daily. Edrick Kins, DPM Taking Active   ipratropium (ATROVENT) 0.03 % nasal spray [id number] Yes Place 2 sprays into both nostrils 2 (two) times daily. Crecencio Mc, MD Taking Active            Med Note Darnelle Maffucci, Alekzander Cardell E   Thu Sep 04, 2019   4:04 PM) PRN  ipratropium-albuterol (DUONEB) 0.5-2.5 (3) MG/3ML SOLN [id number] Yes Take 3 mLs by nebulization every 6 (six) hours as needed (wheezing). Crecencio Mc, MD Taking Active   Lactulose 20 GM/30ML SOLN [id number] Yes 30 ml every 4 hours until constipation is relieved Crecencio Mc, MD Taking Active            Med Note Nat Christen Sep 04, 2019  4:04 PM) PRN  losartan-hydrochlorothiazide (HYZAAR) 100-25 MG tablet [id number] Yes TAKE 1 TABLET EVERY DAY Crecencio Mc, MD Taking Active   meloxicam (MOBIC) 7.5 MG tablet [id number] Yes Take 1 tablet (7.5 mg total) by mouth daily. Crecencio Mc, MD Taking Active   methocarbamol (ROBAXIN) 500 MG tablet [id number] Yes Take 1 tablet (500 mg total) by mouth every 8 (eight) hours as needed for muscle spasms. Crecencio Mc, MD Taking Active            Med Note Darnelle Maffucci, Arville Lime   Thu Sep 04, 2019  4:02 PM) PRN for neck spasms  Multiple Vitamin (MULTIVITAMIN) capsule [id number] Yes Take 1 capsule by mouth daily. [provider] Taking Active Self  nitroGLYCERIN (NITROSTAT) 0.4 MG SL tablet [id number] No Place 1 tablet (0.4 mg total) under the tongue every 5 (five) minutes as needed for chest pain. Maximum dose 3 tablets  Patient not taking: Reported on 09/04/2019   Crecencio Mc, MD Not Taking Active Self           Med Note Leslie Dales, ANNA E   Tue Aug 13, 2018  5:33 PM)    oxyCODONE (ROXICODONE) 15 MG immediate release tablet [id number] Yes Take 1 tablet (15 mg total) by mouth every 8 (eight) hours as needed for pain. Crecencio Mc, MD Taking Active   oxyCODONE (ROXICODONE) 15 MG immediate release tablet [id number]  Take 1 tablet (15 mg total) by mouth every 8 (eight) hours as needed for pain. Crecencio Mc, MD  Active   oxyCODONE (ROXICODONE) 15 MG immediate release tablet [id number]  Take 1 tablet (15 mg total) by mouth every 6 (six) hours as needed for pain. Crecencio Mc, MD  Active   OXYGEN [id number]  Inhale 2 L  into the lungs 3 (three) times daily as needed (shortness of breath).  [provider]  Active Self        Discontinued 09/04/19 1603 (Completed Course)   simvastatin (ZOCOR) 20 MG tablet [id number] Yes Take 1 tablet (20 mg total) by mouth at bedtime. Jodelle Green, FNP Taking Active   VENTOLIN HFA 108 (90 Base) MCG/ACT inhaler [id number]  INHALE 2 PUFFS EVERY 6 HOURS AS NEEDED FOR WHEEZING OR SHORTNESS OF BREATH Crecencio Mc, MD  Active  Assessment:   Goals Addressed            This Visit's Progress     Patient Stated   . "My breathing is bad" (pt-stated)       Current Barriers:  . Financial concerns; patient reports that he is almost out of Brovana and Pulmicort respules for his nebulizer; he notes the costs have increased with these to ~$240/month. Receiving these from "[facility]"; notes the refill requests have been going to his previous pulmonologist Dr. Alva Garnet, but he no longer follows with this provider o Notes that he was switched to nebulizer medications previously d/t inhalers being too expensive o Notes that the price on these medications has recently increased. Unsure what stage of medication/device coverage he is in . Notes that he has not been using Brovana + Pulmicort BID regularly due to trying to stretch out the supply. Notes worsening of his breathing, with more SOB on exertion  Pharmacist Clinical Goal(s):  Marland Kitchen Over the next 90 days, patient will work with PharmD and provider towards optimized medication management  Interventions: . Comprehensive medication review performed; medication list updated in electronic medical record . [place]; called in verbal refills for Brovana and Pulmicort so that patient does not run out. Informed him of this. . There are not patient assistance programs for these two medications, and they are being covered by his medical insurance. Reviewed patient income information to see if he would qualify  for assistance for equivalent inhalers; however, his wife's retirement puts them over income.  . I will contact [employer] to determine if there are other, more affordable options at this time.   Patient Self Care Activities:  . Patient will take medications as prescribed  Initial goal documentation        Plan: - Will investigate alternative options for bronchodilator treatment for this patient  Catie Darnelle Maffucci, PharmD, [place] Pharmacist [place] [phone]

## 2019-09-04 NOTE — Patient Instructions (Signed)
Visit Information  Goals Addressed            This Visit's Progress     Patient Stated   . "My breathing is bad" (pt-stated)       Current Barriers:  . Financial concerns; patient reports that he is almost out of Brovana and Pulmicort respules for his nebulizer; he notes the costs have increased with these to ~$240/month. Receiving these from "[place]"; notes the refill requests have been going to his previous pulmonologist Dr. Alva Garnet, but he no longer follows with this provider o Notes that he was switched to nebulizer medications previously d/t inhalers being too expensive o Notes that the price on these medications has recently increased. Unsure what stage of medication/device coverage he is in . Notes that he has not been using Brovana + Pulmicort BID regularly due to trying to stretch out the supply. Notes worsening of his breathing, with more SOB on exertion  Pharmacist Clinical Goal(s):  Marland Kitchen Over the next 90 days, patient will work with PharmD and provider towards optimized medication management  Interventions: . Comprehensive medication review performed; medication list updated in electronic medical record . [place]; called in verbal refills for Brovana and Pulmicort so that patient does not run out. Informed him of this. . There are not patient assistance programs for these two medications, and they are being covered by his medical insurance. Reviewed patient income information to see if he would qualify for assistance for equivalent inhalers; however, his wife's retirement puts them over income.  . I will contact [employer] to determine if there are other, more affordable options at this time.   Patient Self Care Activities:  . Patient will take medications as prescribed  Initial goal documentation        The patient verbalized understanding of instructions provided today and declined a print copy of patient instruction materials.   Plan: - Will  investigate alternative options for bronchodilator treatment for this patient  Catie Darnelle Maffucci, PharmD, [place] Pharmacist [place] [phone]

## 2019-09-07 DIAGNOSIS — J449 Chronic obstructive pulmonary disease, unspecified: Secondary | ICD-10-CM | POA: Diagnosis not present

## 2019-09-08 ENCOUNTER — Ambulatory Visit: Payer: Self-pay | Admitting: Pharmacist

## 2019-09-08 DIAGNOSIS — J431 Panlobular emphysema: Secondary | ICD-10-CM

## 2019-09-08 NOTE — Patient Instructions (Signed)
Visit Information  Goals Addressed            This Visit's Progress     Patient Stated   . "My breathing is bad" (pt-stated)       Current Barriers:  . Financial concerns; patient reports the costs for Brovana and pulmicort have increased with these to ~$240/month. Receiving these from "[place]"  Pharmacist Clinical Goal(s):  Marland Kitchen Over the next 90 days, patient will work with PharmD and provider towards optimized medication management  Interventions: . [place] billing department. They noted that as of January, this group was no longer contracted to bill under Part B for the [facility] plan that the patient has. Therefore, they have been billing under Part D since the beginning of the year. I suspect the price increase occurred when patient hit the Medicare Coverage Gap.  [place] to attempt to determine what other DME supplies are covered on the contract to bill under Part B. The representative I spoke with was unhelpful. I have communicated with my supervisor to try to communicate with [employer] to determine contracted DME suppliers.   Patient Self Care Activities:  . Patient will take medications as prescribed  Please see past updates related to this goal by clicking on the "Past Updates" button in the selected goal         The patient verbalized understanding of instructions provided today and declined a print copy of patient instruction materials.   Plan:  - Will continue to work on determining more cost effective options for respiratory therapy for this patient.   Catie Darnelle Maffucci, PharmD, [place] Pharmacist [facility] [place] [phone]

## 2019-09-08 NOTE — Chronic Care Management (AMB) (Signed)
Chronic Care Management   Follow Up Note   09/08/2019 Name: Victor Daniels MRN: [id number] DOB: May 27, 1946  Referred by: Sherlene Shamsullo, Teresa L, MD Reason for referral : Chronic Care Management (Medication Management)   Victor Daniels is a 73 y.o. year old male who is a primary care patient of Tullo, Mar Daringeresa L, MD. The CCM team was consulted for assistance with chronic disease management and care coordination needs.    Care coordination completed today.   Review of patient status, including review of consultants reports, relevant laboratory and other test results, and collaboration with appropriate care team members and the patient's provider was performed as part of comprehensive patient evaluation and provision of chronic care management services.    SDOH (Social Determinants of Health) screening performed today: Financial Strain . See Care Plan for related entries.     Outpatient Encounter Medications as of 09/08/2019  Medication Sig Note  . amLODipine (NORVASC) 5 MG tablet Take 1 tablet (5 mg total) by mouth daily.   Marland Kitchen. arformoterol (BROVANA) 15 MCG/2ML NEBU Take 2 mLs (15 mcg total) by nebulization 2 (two) times daily. DX: COPD  DX Code: J44.9   . aspirin 81 MG tablet Take 81 mg by mouth daily.   . budesonide (PULMICORT) 0.25 MG/2ML nebulizer solution Take 2 mLs (0.25 mg total) by nebulization 2 (two) times daily. DX: COPD DX Code: J44.9   . carvedilol (COREG) 12.5 MG tablet TAKE 1 TABLET TWICE DAILY WITH MEALS   . furosemide (LASIX) 20 MG tablet Take 1 tablet (20 mg total) by mouth daily as needed.   Marland Kitchen. gentamicin cream (GARAMYCIN) 0.1 % Apply 1 application topically 2 (two) times daily.   Marland Kitchen. ipratropium (ATROVENT) 0.03 % nasal spray Place 2 sprays into both nostrils 2 (two) times daily. 09/04/2019: PRN  . ipratropium-albuterol (DUONEB) 0.5-2.5 (3) MG/3ML SOLN Take 3 mLs by nebulization every 6 (six) hours as needed (wheezing).   . Lactulose 20 GM/30ML SOLN 30 ml every 4 hours  until constipation is relieved 09/04/2019: PRN  . losartan-hydrochlorothiazide (HYZAAR) 100-25 MG tablet TAKE 1 TABLET EVERY DAY   . meloxicam (MOBIC) 7.5 MG tablet Take 1 tablet (7.5 mg total) by mouth daily.   . methocarbamol (ROBAXIN) 500 MG tablet Take 1 tablet (500 mg total) by mouth every 8 (eight) hours as needed for muscle spasms. 09/04/2019: PRN for neck spasms  . Multiple Vitamin (MULTIVITAMIN) capsule Take 1 capsule by mouth daily.   . nitroGLYCERIN (NITROSTAT) 0.4 MG SL tablet Place 1 tablet (0.4 mg total) under the tongue every 5 (five) minutes as needed for chest pain. Maximum dose 3 tablets (Patient not taking: Reported on 09/04/2019)   . oxyCODONE (ROXICODONE) 15 MG immediate release tablet Take 1 tablet (15 mg total) by mouth every 8 (eight) hours as needed for pain.   Marland Kitchen. oxyCODONE (ROXICODONE) 15 MG immediate release tablet Take 1 tablet (15 mg total) by mouth every 8 (eight) hours as needed for pain.   Marland Kitchen. oxyCODONE (ROXICODONE) 15 MG immediate release tablet Take 1 tablet (15 mg total) by mouth every 6 (six) hours as needed for pain.   . OXYGEN Inhale 2 L into the lungs 3 (three) times daily as needed (shortness of breath).    . simvastatin (ZOCOR) 20 MG tablet Take 1 tablet (20 mg total) by mouth at bedtime.   . VENTOLIN HFA 108 (90 Base) MCG/ACT inhaler INHALE 2 PUFFS EVERY 6 HOURS AS NEEDED FOR WHEEZING OR SHORTNESS OF BREATH    No  facility-administered encounter medications on file as of 09/08/2019.      Goals Addressed            This Visit's Progress     Patient Stated   . "My breathing is bad" (pt-stated)       Current Barriers:  . Financial concerns; patient reports the costs for Brovana and pulmicort have increased with these to ~$240/month. Receiving these from "[place]"  Pharmacist Clinical Goal(s):  Marland Kitchen Over the next 90 days, patient will work with PharmD and provider towards optimized medication management  Interventions: . [facility]  billing department. They noted that as of January, this group was no longer contracted to bill under Part B for the [facility] plan that the patient has. Therefore, they have been billing under Part D since the beginning of the year. I suspect the price increase occurred when patient hit the Medicare Coverage Gap.  [place] to attempt to determine what other DME supplies are covered on the contract to bill under Part B. The representative I spoke with was unhelpful. I have communicated with my supervisor to try to communicate with [employer] to determine contracted DME suppliers.   Patient Self Care Activities:  . Patient will take medications as prescribed  Please see past updates related to this goal by clicking on the "Past Updates" button in the selected goal        Plan:  - Will continue to work on determining more cost effective options for respiratory therapy for this patient.   Catie Darnelle Maffucci, PharmD, [place] Pharmacist [facility] [place] [phone]

## 2019-09-09 ENCOUNTER — Telehealth: Payer: Self-pay

## 2019-09-09 NOTE — Telephone Encounter (Signed)
Copied from [place] [id number]. Topic: General - Other >> Sep 09, 2019  9:59 AM Yvette Rack wrote: Reason for CRM: Clyde Canterbury with [employer] called for an update on fax request that was sent on yesterday. Cb# [id number]

## 2019-09-10 ENCOUNTER — Telehealth: Payer: Self-pay

## 2019-09-10 ENCOUNTER — Ambulatory Visit: Payer: Self-pay | Admitting: Pharmacist

## 2019-09-10 DIAGNOSIS — J431 Panlobular emphysema: Secondary | ICD-10-CM

## 2019-09-10 NOTE — Patient Instructions (Signed)
Visit Information  Goals Addressed            This Visit's Progress     Patient Stated   . "My breathing is bad" (pt-stated)       Current Barriers:  . Financial concerns; patient reports the costs for Brovana and pulmicort have increased with these to ~$240/month. Receiving these from [facility] Per pharmacy billing representative at [facility], they are no longer contracted with [employer] to bill under Part B, so they have been billing under Part D. Patient likely hit the Coverage Gap, explaining the increase in cost o Patient contacted me today asking for updates  Pharmacist Clinical Goal(s):  Marland Kitchen Over the next 90 days, patient will work with PharmD and provider towards optimized medication management  Interventions: . I explained the above to patient. He verbalized understanding. Noted that he hasn't heart anything from [facility] about a refill yet.  Harriett Sine. They denied needing any further information on our end. I requested expedited refill to the patient - they noted they would call him to set up shipping today, and will likely be able to overnight the medications to him.  . Have reached out to pharmacist contact at [facility] to determine what local DME suppliers might be in network with [facility] and able to bill Provana/pulmicort nebulizers under Part B for hopefully a lower cost compared to current costs.   Patient Self Care Activities:  . Patient will take medications as prescribed  Please see past updates related to this goal by clicking on the "Past Updates" button in the selected goal         The patient verbalized understanding of instructions provided today and declined a print copy of patient instruction materials.   Plan:  - Will outreach patient with any information/updates I receive after collaborating with [facility] pharmacist.   Courtney Heys, PharmD, [place] Pharmacist [place] [phone]

## 2019-09-10 NOTE — Telephone Encounter (Signed)
Copied from [place] [id number]. Topic: General - Other >> Sep 10, 2019 11:47 AM Yvette Rack wrote: Reason for CRM: Victor Daniels with Victor Daniels stated prior authorization is needed for Rx arformoterol (BROVANA) 15 MCG/2ML NEBU. Victor Daniels stated pt only has 2 days of the medication left. Cb# [id number]

## 2019-09-10 NOTE — Chronic Care Management (AMB) (Signed)
Chronic Care Management   Follow Up Note   09/10/2019 Name: Victor Daniels [place] MRN: [id number] DOB: [birth year]  Referred by: Sherlene Shamsullo, Teresa L, MD Reason for referral : Chronic Care Management (Medication Management)   Victor Daniels [place] is a 73 y.o. year old male who is a primary care patient of Tullo, Mar Daringeresa L, MD. The CCM team was consulted for assistance with chronic disease management and care coordination needs.    Received call from patient today inquiring about nebulized medications.   Review of patient status, including review of consultants reports, relevant laboratory and other test results, and collaboration with appropriate care team members and the patient's provider was performed as part of comprehensive patient evaluation and provision of chronic care management services.    SDOH (Social Determinants of Health) screening performed today: Financial Strain . See Care Plan for related entries.    Outpatient Encounter Medications as of 09/10/2019  Medication Sig Note  . amLODipine (NORVASC) 5 MG tablet Take 1 tablet (5 mg total) by mouth daily.   Marland Kitchen. arformoterol (BROVANA) 15 MCG/2ML NEBU Take 2 mLs (15 mcg total) by nebulization 2 (two) times daily. DX: COPD  DX Code: J44.9   . aspirin 81 MG tablet Take 81 mg by mouth daily.   . budesonide (PULMICORT) 0.25 MG/2ML nebulizer solution Take 2 mLs (0.25 mg total) by nebulization 2 (two) times daily. DX: COPD DX Code: J44.9   . carvedilol (COREG) 12.5 MG tablet TAKE 1 TABLET TWICE DAILY WITH MEALS   . furosemide (LASIX) 20 MG tablet Take 1 tablet (20 mg total) by mouth daily as needed.   Marland Kitchen. gentamicin cream (GARAMYCIN) 0.1 % Apply 1 application topically 2 (two) times daily.   Marland Kitchen. ipratropium (ATROVENT) 0.03 % nasal spray Place 2 sprays into both nostrils 2 (two) times daily. 09/04/2019: PRN  . ipratropium-albuterol (DUONEB) 0.5-2.5 (3) MG/3ML SOLN Take 3 mLs by nebulization every 6 (six) hours as needed (wheezing).   . Lactulose  20 GM/30ML SOLN 30 ml every 4 hours until constipation is relieved 09/04/2019: PRN  . losartan-hydrochlorothiazide (HYZAAR) 100-25 MG tablet TAKE 1 TABLET EVERY DAY   . meloxicam (MOBIC) 7.5 MG tablet Take 1 tablet (7.5 mg total) by mouth daily.   . methocarbamol (ROBAXIN) 500 MG tablet Take 1 tablet (500 mg total) by mouth every 8 (eight) hours as needed for muscle spasms. 09/04/2019: PRN for neck spasms  . Multiple Vitamin (MULTIVITAMIN) capsule Take 1 capsule by mouth daily.   . nitroGLYCERIN (NITROSTAT) 0.4 MG SL tablet Place 1 tablet (0.4 mg total) under the tongue every 5 (five) minutes as needed for chest pain. Maximum dose 3 tablets (Patient not taking: Reported on 09/04/2019)   . oxyCODONE (ROXICODONE) 15 MG immediate release tablet Take 1 tablet (15 mg total) by mouth every 8 (eight) hours as needed for pain.   Marland Kitchen. oxyCODONE (ROXICODONE) 15 MG immediate release tablet Take 1 tablet (15 mg total) by mouth every 8 (eight) hours as needed for pain.   Marland Kitchen. oxyCODONE (ROXICODONE) 15 MG immediate release tablet Take 1 tablet (15 mg total) by mouth every 6 (six) hours as needed for pain.   . OXYGEN Inhale 2 L into the lungs 3 (three) times daily as needed (shortness of breath).    . simvastatin (ZOCOR) 20 MG tablet Take 1 tablet (20 mg total) by mouth at bedtime.   . VENTOLIN HFA 108 (90 Base) MCG/ACT inhaler INHALE 2 PUFFS EVERY 6 HOURS AS NEEDED FOR WHEEZING OR SHORTNESS OF BREATH  No facility-administered encounter medications on file as of 09/10/2019.      Goals Addressed            This Visit's Progress     Patient Stated   . "My breathing is bad" (pt-stated)       Current Barriers:  . Financial concerns; patient reports the costs for Brovana and pulmicort have increased with these to ~$240/month. Receiving these from [facility] Per pharmacy billing representative at [facility], they are no longer contracted with [employer] to bill under Part B, so they have been billing under Part D. Patient  likely hit the Coverage Gap, explaining the increase in cost o Patient contacted me today asking for updates  Pharmacist Clinical Goal(s):  Marland Kitchen Over the next 90 days, patient will work with PharmD and provider towards optimized medication management  Interventions: . I explained the above to patient. He verbalized understanding. Noted that he hasn't heart anything from [facility] about a refill yet.  Harriett Sine. They denied needing any further information on our end. I requested expedited refill to the patient - they noted they would call him to set up shipping today, and will likely be able to overnight the medications to him.  . Have reached out to pharmacist contact at [facility] to determine what local DME suppliers might be in network with [facility] and able to bill Provana/pulmicort nebulizers under Part B for hopefully a lower cost compared to current costs.   Patient Self Care Activities:  . Patient will take medications as prescribed  Please see past updates related to this goal by clicking on the "Past Updates" button in the selected goal         Plan:  - Will outreach patient with any information/updates I receive after collaborating with [facility] pharmacist.   Courtney Heys, PharmD, [place] Pharmacist [place] [phone]

## 2019-09-11 NOTE — Telephone Encounter (Signed)
PA has been submitted on covermymeds.  

## 2019-09-12 NOTE — Telephone Encounter (Signed)
Victor Daniels from [facility] called, informed of message below, she is requesting call back. [phone] (option --> insurance)

## 2019-09-15 ENCOUNTER — Ambulatory Visit: Payer: Self-pay | Admitting: Pharmacist

## 2019-09-15 MED ORDER — FORMOTEROL FUMARATE 20 MCG/2ML IN NEBU: 20.0000 ug | INHALATION_SOLUTION | Freq: Two times a day (BID) | RESPIRATORY_TRACT | 0 refills | Status: DC

## 2019-09-15 NOTE — Telephone Encounter (Signed)
YES PERFOROMIST PRESCRIPTION PRINTED SEND TO [employer]

## 2019-09-15 NOTE — Telephone Encounter (Signed)
It has been signed

## 2019-09-15 NOTE — Chronic Care Management (AMB) (Signed)
  Chronic Care Management   Note  09/15/2019 Name: AIRAM HEIDECKER MRN: [id number] DOB: 12/03/1946  Edson Snowball is a 73 y.o. year old male who is a primary care patient of Derrel Nip, Aris Everts, MD. The CCM team was consulted for assistance with chronic disease management and care coordination needs.    Received call from patient about Brovana no longer being covered by [facility]. Contacted patient, informed him that change to Perforomist was approved by Dr. Derrel Nip and signed today.   Follow up plan: - Will follow up with patient as scheduled.   Catie Darnelle Maffucci, PharmD, [place] Pharmacist [facility] [place] [phone]

## 2019-09-15 NOTE — Addendum Note (Signed)
Addended by: Crecencio Mc on: 09/15/2019 11:40 AM   Modules accepted: Orders

## 2019-09-16 NOTE — Telephone Encounter (Signed)
Rx has been faxed to [facility].

## 2019-09-17 ENCOUNTER — Telehealth: Payer: Self-pay

## 2019-09-17 NOTE — Telephone Encounter (Signed)
Copied from [place] [id number]. Topic: General - Other >> Sep 17, 2019 12:28 PM Leward Quan A wrote:  Reason for CRM: Clyde Canterbury with [facility] called to check the status of a prior authorization sent on 09/16/2019 for formoterol (PERFOROMIST) 20 MCG/2ML nebulizer solution . Please call Ph# [phone]

## 2019-09-19 NOTE — Telephone Encounter (Signed)
Victor Daniels with [facility] Calling to check on the status of this.  States they are wanting the prior authorization submitted through Cover My Meds.

## 2019-09-22 ENCOUNTER — Ambulatory Visit: Payer: Self-pay | Admitting: Pharmacist

## 2019-09-22 ENCOUNTER — Telehealth: Payer: Self-pay

## 2019-09-22 DIAGNOSIS — J431 Panlobular emphysema: Secondary | ICD-10-CM

## 2019-09-22 MED ORDER — FORMOTEROL FUMARATE 20 MCG/2ML IN NEBU: 20.0000 ug | INHALATION_SOLUTION | Freq: Two times a day (BID) | RESPIRATORY_TRACT | 5 refills | Status: DC

## 2019-09-22 MED ORDER — IPRATROPIUM-ALBUTEROL 0.5-2.5 (3) MG/3ML IN SOLN: 3.0000 mL | Freq: Four times a day (QID) | RESPIRATORY_TRACT | 5 refills | Status: DC | PRN

## 2019-09-22 MED ORDER — BUDESONIDE 0.25 MG/2ML IN SUSP: 0.2500 mg | Freq: Two times a day (BID) | RESPIRATORY_TRACT | 5 refills | Status: DC

## 2019-09-22 NOTE — Telephone Encounter (Signed)
PA for perforomist has been submitted on covermymeds.

## 2019-09-22 NOTE — Telephone Encounter (Signed)
Transferred meds to [facility] for easier attainability w/ same price/coverage.   Apparently pulmicort now requires a PA as well.

## 2019-09-22 NOTE — Chronic Care Management (AMB) (Signed)
Chronic Care Management   Follow Up Note   09/22/2019 Name: Victor Daniels MRN: [id number] DOB: 01/10/1946  Referred by: Crecencio Mc, MD Reason for referral : Chronic Care Management (Medication Management)   Victor Daniels is a 73 y.o. year old male who is a primary care patient of Tullo, Aris Everts, MD. The CCM team was consulted for assistance with chronic disease management and care coordination needs.    Care coordination completed today.   Review of patient status, including review of consultants reports, relevant laboratory and other test results, and collaboration with appropriate care team members and the patient's provider was performed as part of comprehensive patient evaluation and provision of chronic care management services.    SDOH (Social Determinants of Health) screening performed today: Financial Strain . See Care Plan for related entries.   Advanced Directives Status: N See Care Plan and Vynca application for related entries.  Outpatient Encounter Medications as of 09/22/2019  Medication Sig Note  . ipratropium-albuterol (DUONEB) 0.5-2.5 (3) MG/3ML SOLN Take 3 mLs by nebulization every 6 (six) hours as needed (wheezing). DX: COPD DX Code: J44.9   . [DISCONTINUED] ipratropium-albuterol (DUONEB) 0.5-2.5 (3) MG/3ML SOLN Take 3 mLs by nebulization every 6 (six) hours as needed (wheezing).   Marland Kitchen amLODipine (NORVASC) 5 MG tablet Take 1 tablet (5 mg total) by mouth daily.   Marland Kitchen aspirin 81 MG tablet Take 81 mg by mouth daily.   . budesonide (PULMICORT) 0.25 MG/2ML nebulizer solution Take 2 mLs (0.25 mg total) by nebulization 2 (two) times daily. DX: COPD DX Code: J44.9   . carvedilol (COREG) 12.5 MG tablet TAKE 1 TABLET TWICE DAILY WITH MEALS   . formoterol (PERFOROMIST) 20 MCG/2ML nebulizer solution Take 2 mLs (20 mcg total) by nebulization 2 (two) times daily. DX: COPD DX Code: J44.9   . furosemide (LASIX) 20 MG tablet Take 1 tablet (20 mg total) by mouth daily  as needed.   Marland Kitchen gentamicin cream (GARAMYCIN) 0.1 % Apply 1 application topically 2 (two) times daily.   Marland Kitchen ipratropium (ATROVENT) 0.03 % nasal spray Place 2 sprays into both nostrils 2 (two) times daily. 09/04/2019: PRN  . Lactulose 20 GM/30ML SOLN 30 ml every 4 hours until constipation is relieved 09/04/2019: PRN  . losartan-hydrochlorothiazide (HYZAAR) 100-25 MG tablet TAKE 1 TABLET EVERY DAY   . meloxicam (MOBIC) 7.5 MG tablet Take 1 tablet (7.5 mg total) by mouth daily.   . methocarbamol (ROBAXIN) 500 MG tablet Take 1 tablet (500 mg total) by mouth every 8 (eight) hours as needed for muscle spasms. 09/04/2019: PRN for neck spasms  . Multiple Vitamin (MULTIVITAMIN) capsule Take 1 capsule by mouth daily.   . nitroGLYCERIN (NITROSTAT) 0.4 MG SL tablet Place 1 tablet (0.4 mg total) under the tongue every 5 (five) minutes as needed for chest pain. Maximum dose 3 tablets (Patient not taking: Reported on 09/04/2019)   . oxyCODONE (ROXICODONE) 15 MG immediate release tablet Take 1 tablet (15 mg total) by mouth every 8 (eight) hours as needed for pain.   Marland Kitchen oxyCODONE (ROXICODONE) 15 MG immediate release tablet Take 1 tablet (15 mg total) by mouth every 8 (eight) hours as needed for pain.   Marland Kitchen oxyCODONE (ROXICODONE) 15 MG immediate release tablet Take 1 tablet (15 mg total) by mouth every 6 (six) hours as needed for pain.   . OXYGEN Inhale 2 L into the lungs 3 (three) times daily as needed (shortness of breath).    . simvastatin (ZOCOR) 20 MG  tablet Take 1 tablet (20 mg total) by mouth at bedtime.   . VENTOLIN HFA 108 (90 Base) MCG/ACT inhaler INHALE 2 PUFFS EVERY 6 HOURS AS NEEDED FOR WHEEZING OR SHORTNESS OF BREATH   . [DISCONTINUED] arformoterol (BROVANA) 15 MCG/2ML NEBU Take 2 mLs (15 mcg total) by nebulization 2 (two) times daily. DX: COPD  DX Code: J44.9 (Patient not taking: Reported on 09/22/2019)   . [DISCONTINUED] budesonide (PULMICORT) 0.25 MG/2ML nebulizer solution Take 2 mLs (0.25 mg total) by  nebulization 2 (two) times daily. DX: COPD DX Code: J44.9   . [DISCONTINUED] formoterol (PERFOROMIST) 20 MCG/2ML nebulizer solution Take 2 mLs (20 mcg total) by nebulization 2 (two) times daily. (Patient not taking: Reported on 09/22/2019)    No facility-administered encounter medications on file as of 09/22/2019.      Goals Addressed            This Visit's Progress     Patient Stated   . "My breathing is bad" (pt-stated)       Current Barriers:  . Financial concerns; patient reports the costs for Brovana and pulmicort have increased with these to ~$240/month. Receiving these from [facility] o Per [facility], Rosalyn Gess on Part D requires a trial of Perforomist first. Perforomist requiring PA on Part D o Patient notes he received Pulmicort from [facility] but needs Duonebs and LABA   Pharmacist Clinical Goal(s):  Marland Kitchen Over the next 90 days, patient will work with PharmD and provider towards optimized medication management  Interventions: . Corresponded with [facility] pharmacist. They noted that there were no claims for either Pulmicort or Brovana through the beginning of the year on either Part B or Part D, but then they could see the Duonebs claim on Part B at [facility].  . They noted that the medications should be covered at [facility] on Part D, but most cost effective pharmacy on Part D would be at [facility]  . As Duonebs could go under Part B at [employer], collaborated with patient and sent the prescriptions to [place]. Contacted [facility]; they are filling Duonebs, Pulmicort cannot be filled until 10/03/2019, and they were needing patient's Medicare A/B number to bill Perforomist under Part B. Provided this. They are working on filling these prescriptions.  . Contacted patient to discuss. Left HIPAA compliant message for patient to return my call at his convenience.   Patient Self Care Activities:  . Patient will take medications as prescribed  Please see past updates  related to this goal by clicking on the "Past Updates" button in the selected goal          Plan:  - Will continue to collaborate with patient for medication access.  - Will outreach patient in the next 2-3 weeks as scheduled.   Catie Feliz Beam, PharmD, CPP Clinical Pharmacist [facility] [employer] [phone]

## 2019-09-22 NOTE — Patient Instructions (Signed)
Visit Information  Goals Addressed            This Visit's Progress     Patient Stated   . "My breathing is bad" (pt-stated)       Current Barriers:  . Financial concerns; patient reports the costs for Brovana and pulmicort have increased with these to ~$240/month. Receiving these from [facility] o Per [place] on Part D requires a trial of Perforomist first. Perforomist requiring PA on Part D o Patient notes he received Pulmicort from [facility], but needs Duonebs and LABA   Pharmacist Clinical Goal(s):  Marland Kitchen Over the next 90 days, patient will work with PharmD and provider towards optimized medication management  Interventions: . Corresponded with [facility] pharmacist. They noted that there were no claims for either Pulmicort or Brovana through the beginning of the year on either Part B or Part D, but then they could see the Duonebs claim on Part B at [facility]  . They noted that the medications should be covered at [facility] on Part D, but most cost effective pharmacy on Part D would be at [place]  . As Duonebs could go under Part B at [employer], collaborated with patient and sent the prescriptions to [place]. Contacted [facility]; they are filling Duonebs, Pulmicort cannot be filled until 10/03/2019, and they were needing patient's Medicare A/B number to bill Perforomist under Part B. Provided this. They are working on filling these prescriptions.  . Contacted patient to discuss. Left HIPAA compliant message for patient to return my call at his convenience.   Patient Self Care Activities:  . Patient will take medications as prescribed  Please see past updates related to this goal by clicking on the "Past Updates" button in the selected goal         The patient verbalized understanding of instructions provided today and declined a print copy of patient instruction materials.   Plan:  - Will continue to collaborate with patient for medication access.  -  Will outreach patient in the next 2-3 weeks as scheduled.   Catie Darnelle Maffucci, PharmD, [place] Pharmacist [facility] [phone]

## 2019-09-23 NOTE — Telephone Encounter (Signed)
Victor Daniels called to check status of PA /When office gets the PA approval back can you fax it to [employer] fax# [phone]

## 2019-09-24 NOTE — Telephone Encounter (Signed)
Medication has been approved

## 2019-09-25 ENCOUNTER — Ambulatory Visit: Payer: Medicare HMO | Admitting: Neurology

## 2019-09-25 ENCOUNTER — Ambulatory Visit: Payer: Self-pay | Admitting: Pharmacist

## 2019-09-25 ENCOUNTER — Encounter

## 2019-09-25 DIAGNOSIS — J431 Panlobular emphysema: Secondary | ICD-10-CM

## 2019-09-25 MED ORDER — ANORO ELLIPTA 62.5-25 MCG/INH IN AEPB: 1.0000 | INHALATION_SPRAY | Freq: Every day | RESPIRATORY_TRACT | 1 refills | Status: DC

## 2019-09-25 NOTE — Patient Instructions (Signed)
Visit Information  Goals Addressed            This Visit's Progress     Patient Stated   . "My breathing is bad" (pt-stated)       Current Barriers:  . Financial concerns; patient reports the costs for Brovana and pulmicort have increased with these to ~$240/month. Receiving these from [facility]. After discussing with [facility] pharmacist, it was noted that patient would pay the same amount for these medications at a local pharmacy, and wouldn't have to worry about shipping/delivery. Patient was previously having these medications filled under Part B, but for contracting reasons with [employer], they would now be filled under Part D . Sent Pulmicort and Perforomist to [facility]. PA completed. However, nebulized LABA medications are Tier 4 on his insurance, so >$200/30 day  Pharmacist Clinical Goal(s):  Marland Kitchen Over the next 90 days, patient will work with PharmD and provider towards optimized medication management  Interventions: . Discussed with patient; he notes he was originally switched to nebulized therapy because it was cheaper for him at that time, not because he could not derive benefit from inhalers. He notes that he is tired of being "attached to my nebulizer".  . Collaboratively decided to switch back to inhaler therapy; recommend LABA/LAMA combination. Reviewed formulary; will start Anoro 1 puff once daily. Counseled on maintenance vs rescue inhaler, noted that he could continue to use Duonebs or Ventolin PRN symptoms.   Patient Self Care Activities:  . Patient will take medications as prescribed  Please see past updates related to this goal by clicking on the "Past Updates" button in the selected goal         The patient verbalized understanding of instructions provided today and declined a print copy of patient instruction materials.   Plan:  - Will outreach patient in ~2-3 weeks as scheduled for medication management review.   Catie Darnelle Maffucci, PharmD, [place]  Pharmacist [facility] [phone]

## 2019-09-25 NOTE — Addendum Note (Signed)
Addended by: Alphonzo Severance E on: 09/25/2019 10:14 AM   Modules accepted: Orders

## 2019-09-25 NOTE — Chronic Care Management (AMB) (Addendum)
Chronic Care Management   Follow Up Note   09/25/2019 Name: Victor Daniels MRN: [id number] DOB: [birth year]  Referred by: [name], MD Reason for referral : Chronic Care Management (Medication Management)   Victor Daniels is a 73 y.o. year old male who is a primary care patient of Tullo, Mar Daring, MD. The CCM team was consulted for assistance with chronic disease management and care coordination needs.    Care coordination completed today.   Patient also reports needing a refill on meloxicam, and noting that he will need an oxycodone refill in ~2-3 weeks. Wonders if Dr. Darrick [facility] would like him to schedule an appointment.   Review of patient status, including review of consultants reports, relevant laboratory and other test results, and collaboration with appropriate care team members and the patient's provider was performed as part of comprehensive patient evaluation and provision of chronic care management services.    SDOH (Social Determinants of Health) screening performed today: Financial Strain . See Care Plan for related entries.   Advanced Directives Status: N See Care Plan and Vynca application for related entries.  Outpatient Encounter Medications as of 09/25/2019  Medication Sig Note  . amLODipine (NORVASC) 5 MG tablet Take 1 tablet (5 mg total) by mouth daily.   Marland Kitchen aspirin 81 MG tablet Take 81 mg by mouth daily.   . budesonide (PULMICORT) 0.25 MG/2ML nebulizer solution Take 2 mLs (0.25 mg total) by nebulization 2 (two) times daily. DX: COPD DX Code: J44.9   . carvedilol (COREG) 12.5 MG tablet TAKE 1 TABLET TWICE DAILY WITH MEALS   . formoterol (PERFOROMIST) 20 MCG/2ML nebulizer solution Take 2 mLs (20 mcg total) by nebulization 2 (two) times daily. DX: COPD DX Code: J44.9   . furosemide (LASIX) 20 MG tablet Take 1 tablet (20 mg total) by mouth daily as needed.   Marland Kitchen gentamicin cream (GARAMYCIN) 0.1 % Apply 1 application topically 2 (two) times daily.   Marland Kitchen  ipratropium (ATROVENT) 0.03 % nasal spray Place 2 sprays into both nostrils 2 (two) times daily. 09/04/2019: PRN  . ipratropium-albuterol (DUONEB) 0.5-2.5 (3) MG/3ML SOLN Take 3 mLs by nebulization every 6 (six) hours as needed (wheezing). DX: COPD DX Code: J44.9   . Lactulose 20 GM/30ML SOLN 30 ml every 4 hours until constipation is relieved 09/04/2019: PRN  . losartan-hydrochlorothiazide (HYZAAR) 100-25 MG tablet TAKE 1 TABLET EVERY DAY   . meloxicam (MOBIC) 7.5 MG tablet Take 1 tablet (7.5 mg total) by mouth daily.   . methocarbamol (ROBAXIN) 500 MG tablet Take 1 tablet (500 mg total) by mouth every 8 (eight) hours as needed for muscle spasms. 09/04/2019: PRN for neck spasms  . Multiple Vitamin (MULTIVITAMIN) capsule Take 1 capsule by mouth daily.   . nitroGLYCERIN (NITROSTAT) 0.4 MG SL tablet Place 1 tablet (0.4 mg total) under the tongue every 5 (five) minutes as needed for chest pain. Maximum dose 3 tablets (Patient not taking: Reported on 09/04/2019)   . oxyCODONE (ROXICODONE) 15 MG immediate release tablet Take 1 tablet (15 mg total) by mouth every 8 (eight) hours as needed for pain.   Marland Kitchen oxyCODONE (ROXICODONE) 15 MG immediate release tablet Take 1 tablet (15 mg total) by mouth every 8 (eight) hours as needed for pain.   Marland Kitchen oxyCODONE (ROXICODONE) 15 MG immediate release tablet Take 1 tablet (15 mg total) by mouth every 6 (six) hours as needed for pain.   . OXYGEN Inhale 2 L into the lungs 3 (three) times daily as needed (  shortness of breath).    . simvastatin (ZOCOR) 20 MG tablet Take 1 tablet (20 mg total) by mouth at bedtime.   Marland Kitchen umeclidinium-vilanterol (ANORO ELLIPTA) 62.5-25 MCG/INH AEPB Inhale 1 puff into the lungs daily.   . VENTOLIN HFA 108 (90 Base) MCG/ACT inhaler INHALE 2 PUFFS EVERY 6 HOURS AS NEEDED FOR WHEEZING OR SHORTNESS OF BREATH    No facility-administered encounter medications on file as of 09/25/2019.      Goals Addressed            This Visit's Progress     Patient  Stated   . "My breathing is bad" (pt-stated)       Current Barriers:  . Financial concerns; patient reports the costs for Brovana and pulmicort have increased with these to ~$240/month. Receiving these from [facility]. After discussing with [facility] pharmacist, it was noted that patient would pay the same amount for these medications at a local pharmacy, and wouldn't have to worry about shipping/delivery. Patient was previously having these medications filled under Part B, but for contracting reasons with [employer], they would now be filled under Part D . Sent Pulmicort and Perforomist to [facility]. PA completed. However, nebulized LABA medications are Tier 4 on his insurance, so >$200/30 day  Pharmacist Clinical Goal(s):  Marland Kitchen Over the next 90 days, patient will work with PharmD and provider towards optimized medication management  Interventions: . Discussed with patient; he notes he was originally switched to nebulized therapy because it was cheaper for him at that time, not because he could not derive benefit from inhalers. He notes that he is tired of being "attached to my nebulizer".  . Collaboratively decided to switch back to inhaler therapy; recommend LABA/LAMA combination. Reviewed formulary; will start Anoro 1 puff once daily. Counseled on maintenance vs rescue inhaler, noted that he could continue to use Duonebs or Ventolin PRN symptoms.   Patient Self Care Activities:  . Patient will take medications as prescribed  Please see past updates related to this goal by clicking on the "Past Updates" button in the selected goal         Plan:  - Will outreach patient in ~2-3 weeks as scheduled for medication management review.  - Will collaborate w/ Dr. Derrel Nip on patient's questions about his pain management medication refills  Catie Darnelle Maffucci, PharmD, [place] Pharmacist [place] [phone]

## 2019-09-30 ENCOUNTER — Ambulatory Visit: Payer: Medicare HMO | Admitting: Podiatry

## 2019-10-06 ENCOUNTER — Other Ambulatory Visit: Payer: Self-pay | Admitting: Internal Medicine

## 2019-10-06 ENCOUNTER — Ambulatory Visit: Payer: Self-pay | Admitting: Pharmacist

## 2019-10-06 DIAGNOSIS — J431 Panlobular emphysema: Secondary | ICD-10-CM

## 2019-10-06 NOTE — Chronic Care Management (AMB) (Signed)
Chronic Care Management   Follow Up Note   10/06/2019 Name: Victor Daniels MRN: [id number] DOB: October 21, 1946  Referred by: [name], MD Reason for referral : Chronic Care Management (Medication Management)   Victor Daniels is a 73 y.o. year old male who is a primary care patient of Tullo, Mar Daring, MD. The CCM team was consulted for assistance with chronic disease management and care coordination needs.    Patient contacted me regarding needing a refill on meloxicam. He contacted [facility], and they sent in refill request. This has been refilled by Dr. Darrick Huntsman.  Review of patient status, including review of consultants reports, relevant laboratory and other test results, and collaboration with appropriate care team members and the patient's provider was performed as part of comprehensive patient evaluation and provision of chronic care management services.    SDOH (Social Determinants of Health) screening performed today: Financial Strain . See Care Plan for related entries.   Advanced Directives Status: N See Care Plan and Vynca application for related entries.  Outpatient Encounter Medications as of 10/06/2019  Medication Sig Note  . ipratropium-albuterol (DUONEB) 0.5-2.5 (3) MG/3ML SOLN Take 3 mLs by nebulization every 6 (six) hours as needed (wheezing). DX: COPD DX Code: J44.9 10/06/2019: Using 1-2 times daily   . amLODipine (NORVASC) 5 MG tablet Take 1 tablet (5 mg total) by mouth daily.   Marland Kitchen aspirin 81 MG tablet Take 81 mg by mouth daily.   . carvedilol (COREG) 12.5 MG tablet TAKE 1 TABLET TWICE DAILY WITH MEALS   . furosemide (LASIX) 20 MG tablet Take 1 tablet (20 mg total) by mouth daily as needed.   Marland Kitchen gentamicin cream (GARAMYCIN) 0.1 % Apply 1 application topically 2 (two) times daily.   Marland Kitchen ipratropium (ATROVENT) 0.03 % nasal spray Place 2 sprays into both nostrils 2 (two) times daily. 09/04/2019: PRN  . Lactulose 20 GM/30ML SOLN 30 ml every 4 hours until  constipation is relieved 09/04/2019: PRN  . losartan-hydrochlorothiazide (HYZAAR) 100-25 MG tablet TAKE 1 TABLET EVERY DAY   . meloxicam (MOBIC) 7.5 MG tablet Take 1 tablet by mouth once daily   . methocarbamol (ROBAXIN) 500 MG tablet Take 1 tablet (500 mg total) by mouth every 8 (eight) hours as needed for muscle spasms. 09/04/2019: PRN for neck spasms  . Multiple Vitamin (MULTIVITAMIN) capsule Take 1 capsule by mouth daily.   . nitroGLYCERIN (NITROSTAT) 0.4 MG SL tablet Place 1 tablet (0.4 mg total) under the tongue every 5 (five) minutes as needed for chest pain. Maximum dose 3 tablets (Patient not taking: Reported on 09/04/2019)   . oxyCODONE (ROXICODONE) 15 MG immediate release tablet Take 1 tablet (15 mg total) by mouth every 8 (eight) hours as needed for pain.   Marland Kitchen oxyCODONE (ROXICODONE) 15 MG immediate release tablet Take 1 tablet (15 mg total) by mouth every 8 (eight) hours as needed for pain.   Marland Kitchen oxyCODONE (ROXICODONE) 15 MG immediate release tablet Take 1 tablet (15 mg total) by mouth every 6 (six) hours as needed for pain.   . OXYGEN Inhale 2 L into the lungs 3 (three) times daily as needed (shortness of breath).    . simvastatin (ZOCOR) 20 MG tablet Take 1 tablet (20 mg total) by mouth at bedtime.   Marland Kitchen umeclidinium-vilanterol (ANORO ELLIPTA) 62.5-25 MCG/INH AEPB Inhale 1 puff into the lungs daily.   . VENTOLIN HFA 108 (90 Base) MCG/ACT inhaler INHALE 2 PUFFS EVERY 6 HOURS AS NEEDED FOR WHEEZING OR SHORTNESS OF BREATH  No facility-administered encounter medications on file as of 10/06/2019.      Goals Addressed            This Visit's Progress     Patient Stated   . "My breathing is bad" (pt-stated)       Current Barriers:  . Financial concerns; resolved; patient was non-adherent to Brovana + Pulmicort d/t cost becoming excessive; also was tired of being "attached" to his nebulizer. Switched to Anoro + PRN Duonebs for breakthrough SOB . Pleased with cost of Anoro - was ~$120/3  month supply . Notes that he has started Anoro about 5 days ago. He notes that it has improved his breathing, but not as dramatically as he was anticipating. Notes still using ipratropium/albuterol QD-BID PRN  Pharmacist Clinical Goal(s):  Marland Kitchen Over the next 90 days, patient will work with PharmD and provider towards optimized medication management  Interventions: . Discussed with patient that we may not be seeing full benefit of Anoro therapy after just 5 days; encouraged to continue through a month and re-evaluate . Moving forward, consider escalating to triple LAMA/LABA/ICS therapy with Trelegy, if patient still dyspneic.   Patient Self Care Activities:  . Patient will take medications as prescribed  Please see past updates related to this goal by clicking on the "Past Updates" button in the selected goal          Plan:  - Will push outreach to ~3 weeks to re-assess breathing and continued medication management.   Catie Darnelle Maffucci, PharmD, [place] Pharmacist [facility] [place] [phone]

## 2019-10-06 NOTE — Patient Instructions (Signed)
Visit Information  Goals Addressed            This Visit's Progress     Patient Stated   . "My breathing is bad" (pt-stated)       Current Barriers:  . Financial concerns; resolved; patient was non-adherent to Brovana + Pulmicort d/t cost becoming excessive; also was tired of being "attached" to his nebulizer. Switched to Anoro + PRN Duonebs for breakthrough SOB . Pleased with cost of Anoro - was ~$120/3 month supply . Notes that he has started Anoro about 5 days ago. He notes that it has improved his breathing, but not as dramatically as he was anticipating. Notes still using ipratropium/albuterol QD-BID PRN  Pharmacist Clinical Goal(s):  Marland Kitchen Over the next 90 days, patient will work with PharmD and provider towards optimized medication management  Interventions: . Discussed with patient that we may not be seeing full benefit of Anoro therapy after just 5 days; encouraged to continue through a month and re-evaluate . Moving forward, consider escalating to triple LAMA/LABA/ICS therapy with Trelegy, if patient still dyspneic.   Patient Self Care Activities:  . Patient will take medications as prescribed  Please see past updates related to this goal by clicking on the "Past Updates" button in the selected goal         The patient verbalized understanding of instructions provided today and declined a print copy of patient instruction materials.   Plan:  - Will push outreach to ~3 weeks to re-assess breathing and continued medication management.   Catie Darnelle Maffucci, PharmD, [place] Pharmacist [facility] [place] [phone]

## 2019-10-07 DIAGNOSIS — J449 Chronic obstructive pulmonary disease, unspecified: Secondary | ICD-10-CM | POA: Diagnosis not present

## 2019-10-08 DIAGNOSIS — E119 Type 2 diabetes mellitus without complications: Secondary | ICD-10-CM | POA: Diagnosis not present

## 2019-10-09 ENCOUNTER — Telehealth: Payer: Medicare HMO

## 2019-10-09 ENCOUNTER — Encounter: Payer: Self-pay | Admitting: Urology

## 2019-10-09 ENCOUNTER — Ambulatory Visit (INDEPENDENT_AMBULATORY_CARE_PROVIDER_SITE_OTHER): Payer: Medicare HMO | Admitting: Urology

## 2019-10-09 ENCOUNTER — Other Ambulatory Visit: Payer: Self-pay

## 2019-10-09 VITALS — BP 127/67 | HR 82 | Ht 72.0 in | Wt 295.1 lb

## 2019-10-09 DIAGNOSIS — R350 Frequency of micturition: Secondary | ICD-10-CM

## 2019-10-09 LAB — URINALYSIS, COMPLETE
Bilirubin, UA: NEGATIVE
Glucose, UA: NEGATIVE
Ketones, UA: NEGATIVE
Leukocytes,UA: NEGATIVE
Nitrite, UA: NEGATIVE
Protein,UA: NEGATIVE
RBC, UA: NEGATIVE
Specific Gravity, UA: 1.02 (ref 1.005–1.030)
Urobilinogen, Ur: 0.2 mg/dL (ref 0.2–1.0)
pH, UA: 7 (ref 5.0–7.5)

## 2019-10-09 LAB — MICROSCOPIC EXAMINATION: RBC, Urine: NONE SEEN /hpf (ref 0–2)

## 2019-10-09 LAB — BLADDER SCAN AMB NON-IMAGING
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
## 2019-10-17 ENCOUNTER — Ambulatory Visit: Payer: Medicare HMO | Admitting: Podiatry

## 2019-10-17 ENCOUNTER — Encounter: Payer: Self-pay | Admitting: Podiatry

## 2019-10-17 ENCOUNTER — Other Ambulatory Visit: Payer: Self-pay

## 2019-10-17 DIAGNOSIS — E0843 Diabetes mellitus due to underlying condition with diabetic autonomic (poly)neuropathy: Secondary | ICD-10-CM | POA: Diagnosis not present

## 2019-10-17 DIAGNOSIS — L97522 Non-pressure chronic ulcer of other part of left foot with fat layer exposed: Secondary | ICD-10-CM

## 2019-10-17 DIAGNOSIS — M79676 Pain in unspecified toe(s): Secondary | ICD-10-CM | POA: Diagnosis not present

## 2019-10-17 DIAGNOSIS — B351 Tinea unguium: Secondary | ICD-10-CM | POA: Diagnosis not present

## 2019-10-20 ENCOUNTER — Telehealth: Payer: Self-pay | Admitting: Pharmacist

## 2019-10-20 MED ORDER — OXYCODONE HCL 15 MG PO TABS: 15.0000 mg | ORAL_TABLET | Freq: Four times a day (QID) | ORAL | 0 refills | Status: DC | PRN

## 2019-10-20 NOTE — Telephone Encounter (Signed)
Patient requests refill on oxycodone from Dr. Derrel Nip.   Routing to Dr. Derrel Nip and CMA Adair Laundry to address

## 2019-10-20 NOTE — Progress Notes (Signed)
Subjective:  73 y.o. male with PMHx of diabetes mellitus for follow up evaluation of an ulceration of the left great toe. He states the wound is relatively unchanged. He has been applying Gentamicin cream as directed. He denies worsening factors.  He also reports elongated, thickened nails 1-5 bilaterally that cause pain while ambulating in shoes. He is unable to trim his own nails. Patient is here for further evaluation and treatment.   Past Medical History:  Diagnosis Date  . Alcoholic gastritis   . Arthritis   . CAD (coronary artery disease)   . CHF (congestive heart failure) (HCC)    ischemic CM.  EF 25%  . COPD (chronic obstructive pulmonary disease) ([place])   . CVA (cerebral infarction)    residual short term memory loss  . Diabetes mellitus without complication (HCC)    diet controlled  . DVT (deep venous thrombosis) ([place])   . Dyspnea    easily  . Heart attack ([place]) 11/16/09  . History of alcohol abuse 2005   now abstinent for years  . Hyperlipidemia   . Hypertension   . On home oxygen therapy    2 liters continuously  . OSA (obstructive sleep apnea)    not on CPAP  . PAD (peripheral artery disease) ([place])   . Pneumonia    frequent in the past  . Pre-diabetes   . Stroke ([place]) 2010  . Venous insufficiency of leg       Objective/Physical Exam General: The patient is alert and oriented x3 in no acute distress.  Dermatology:  Wound #1 noted to the left great toe measuring 1.5 x 1.5 x 0.3 cm (LxWxD).   To the noted ulceration(s), there is no eschar. There is a moderate amount of slough, fibrin, and necrotic tissue noted. Granulation tissue and wound base is red. There is a minimal amount of serosanguineous drainage noted. There is no exposed bone muscle-tendon ligament or joint. There is no malodor. Periwound integrity is intact. Nails are tender, long, thickened and dystrophic with subungual debris, consistent with onychomycosis, 1-5 bilateral. No signs of infection  noted. Skin is warm, dry and supple bilateral lower extremities.  Vascular: Edema noted to the left lower extremity. Palpable pedal pulses bilaterally. No erythema noted. Capillary refill within normal limits.  Neurological: Epicritic and protective threshold diminished bilaterally.   Musculoskeletal Exam: Range of motion within normal limits to all pedal and ankle joints bilateral. Muscle strength 5/5 in all groups bilateral.   Assessment: 1. Left great toe ulceration secondary to diabetes mellitus 2. diabetes mellitus w/ peripheral neuropathy 3. LLE edema  4. Diabetes Mellitus w/ peripheral neuropathy 5. Onychomycosis of nail due to dermatophyte bilateral    Plan of Care:  1. Patient was evaluated. 2. medically necessary excisional debridement including subcutaneous tissue was performed using a tissue nipper and a chisel blade. Excisional debridement of all the necrotic nonviable tissue down to healthy bleeding viable tissue was performed with post-debridement measurements same as pre-. 3. the wound was cleansed and dry sterile dressing applied. 4. Continue using Gentamicin cream daily with a bandage.  5. Mechanical debridement of nails 1-5 bilaterally performed using a nail nipper. Filed with dremel without incident.  6. patient is to return to clinic in 6 weeks.   Edrick Kins, DPM [facility]  Dr. Edrick Kins, DPM    [place] [address]  [place], [place] [address]                Office [phone]  Fax [phone]

## 2019-10-20 NOTE — Telephone Encounter (Signed)
Oxycodone refilled. [lease scedyled 3 month follow up at end of [email or web address]

## 2019-10-23 ENCOUNTER — Telehealth: Payer: Medicare HMO

## 2019-10-23 ENCOUNTER — Ambulatory Visit (INDEPENDENT_AMBULATORY_CARE_PROVIDER_SITE_OTHER): Payer: Medicare HMO | Admitting: Pharmacist

## 2019-10-23 DIAGNOSIS — M4722 Other spondylosis with radiculopathy, cervical region: Secondary | ICD-10-CM

## 2019-10-23 DIAGNOSIS — J431 Panlobular emphysema: Secondary | ICD-10-CM

## 2019-10-23 NOTE — Telephone Encounter (Signed)
Appointment scheduled.

## 2019-10-23 NOTE — Patient Instructions (Signed)
Visit Information  Goals Addressed            This Visit's Progress     Patient Stated   . "My breathing is bad" (pt-stated)       Current Barriers:  . Financial concerns; resolved; patient was non-adherent to Brovana + Pulmicort d/t cost becoming excessive; also was tired of being "attached" to his nebulizer. Switched to Anoro + PRN Duonebs for breakthrough SOB . Pleased with cost of Anoro - was ~$120/3 month supply . Today, notes that his breathing is MUCH improved with Anoro. Notes he is requiring oxygen therapy less frequently, is needing Duonebs less frequently, now only 2-4 times weekly . Currently note struggles with insomnia. Notes he thinks this is partially related to worsening nerve damage (s/p back surgery many years ago, notes a rod in his back and nerve damage in neck and back after that). Does acknowledge that he needs to "regulate" his sleep schedule; has been napping during the day often  o Notes that he discussed nerve pain with his podiatrist; he recommended soaking his feet in epsom salt baths; patient notes he has been doing this, not certain he has felt much benefit  Pharmacist Clinical Goal(s):  Marland Kitchen Over the next 90 days, patient will work with PharmD and provider towards optimized medication management  Interventions: . Congratulated patient on improvement in breathing symptoms with utilization of Anoro. Encouraged continued adherence and use of nebulizer PRN  . Discussed nerve pain in his legs. Consider evaluating for appropriateness of gabapentin therapy in the evenings  . Discussed principals of good sleep hygiene - avoid sleeping during the day, set a regular bed time, avoid caffeine in the afternoons (he notes he hasn't had caffeine products in over 2 months), avoid screen time before bed  . Collaborated w/ patient and Adair Laundry, CMA in scheduling follow up with Dr. Derrel Nip next month  Patient Self Care Activities:  . Patient will take medications as  prescribed  Please see past updates related to this goal by clicking on the "Past Updates" button in the selected goal         The patient verbalized understanding of instructions provided today and declined a print copy of patient instruction materials.    Plan:  - Will outreach patient in ~5-6 weeks for continued medication management support  Catie Darnelle Maffucci, PharmD, [place] Pharmacist [place] [phone]

## 2019-10-23 NOTE — Telephone Encounter (Signed)
LMTCB. Need to schedule pt for a follow up at the end of November.

## 2019-10-23 NOTE — Chronic Care Management (AMB) (Addendum)
Chronic Care Management   Follow Up Note   10/23/2019 Name: Victor Daniels MRN: [id number] DOB: [birth year]  Referred by: [name], MD Reason for referral : Chronic Care Management (Medication Management)   Victor Daniels is a 73 y.o. year old male who is a primary care patient of Tullo, Mar Daring, MD. The CCM team was consulted for assistance with chronic disease management and care coordination needs.    Contacted patient for medication management review.   Review of patient status, including review of consultants reports, relevant laboratory and other test results, and collaboration with appropriate care team members and the patient's provider was performed as part of comprehensive patient evaluation and provision of chronic care management services.    SDOH (Social Determinants of Health) screening performed today: None. See Care Plan for related entries.   Advanced Directives Status: N See Care Plan and Vynca application for related entries.  Outpatient Encounter Medications as of 10/23/2019  Medication Sig Note  . ipratropium-albuterol (DUONEB) 0.5-2.5 (3) MG/3ML SOLN Take 3 mLs by nebulization every 6 (six) hours as needed (wheezing). DX: COPD DX Code: J44.9 10/06/2019: Using 1-2 times daily   . umeclidinium-vilanterol (ANORO ELLIPTA) 62.5-25 MCG/INH AEPB Inhale 1 puff into the lungs daily.   . VENTOLIN HFA 108 (90 Base) MCG/ACT inhaler INHALE 2 PUFFS EVERY 6 HOURS AS NEEDED FOR WHEEZING OR SHORTNESS OF BREATH   . amLODipine (NORVASC) 5 MG tablet Take 1 tablet (5 mg total) by mouth daily.   Marland Kitchen aspirin 81 MG tablet Take 81 mg by mouth daily.   . carvedilol (COREG) 12.5 MG tablet TAKE 1 TABLET TWICE DAILY WITH MEALS   . furosemide (LASIX) 20 MG tablet Take 1 tablet (20 mg total) by mouth daily as needed.   Marland Kitchen gentamicin cream (GARAMYCIN) 0.1 % Apply 1 application topically 2 (two) times daily.   Marland Kitchen ipratropium (ATROVENT) 0.03 % nasal spray Place 2 sprays into  both nostrils 2 (two) times daily. 09/04/2019: PRN  . Lactulose 20 GM/30ML SOLN 30 ml every 4 hours until constipation is relieved 09/04/2019: PRN  . losartan-hydrochlorothiazide (HYZAAR) 100-25 MG tablet TAKE 1 TABLET EVERY DAY   . meloxicam (MOBIC) 7.5 MG tablet Take 1 tablet by mouth once daily   . methocarbamol (ROBAXIN) 500 MG tablet Take 1 tablet (500 mg total) by mouth every 8 (eight) hours as needed for muscle spasms. 09/04/2019: PRN for neck spasms  . Multiple Vitamin (MULTIVITAMIN) capsule Take 1 capsule by mouth daily.   . nitroGLYCERIN (NITROSTAT) 0.4 MG SL tablet Place 1 tablet (0.4 mg total) under the tongue every 5 (five) minutes as needed for chest pain. Maximum dose 3 tablets   . oxyCODONE (ROXICODONE) 15 MG immediate release tablet Take 1 tablet (15 mg total) by mouth every 8 (eight) hours as needed for pain.   Marland Kitchen oxyCODONE (ROXICODONE) 15 MG immediate release tablet Take 1 tablet (15 mg total) by mouth every 8 (eight) hours as needed for pain.   Marland Kitchen oxyCODONE (ROXICODONE) 15 MG immediate release tablet Take 1 tablet (15 mg total) by mouth every 6 (six) hours as needed for pain.   . OXYGEN Inhale 2 L into the lungs 3 (three) times daily as needed (shortness of breath).    . simvastatin (ZOCOR) 20 MG tablet Take 1 tablet (20 mg total) by mouth at bedtime.   Marland Kitchen UNABLE TO FIND Med Name: Urozinc Prostate    No facility-administered encounter medications on file as of 10/23/2019.  Goals Addressed            This Visit's Progress     Patient Stated   . "My breathing is bad" (pt-stated)       Current Barriers:  . Financial concerns; resolved; patient was non-adherent to Brovana + Pulmicort d/t cost becoming excessive; also was tired of being "attached" to his nebulizer. Switched to Anoro + PRN Duonebs for breakthrough SOB . Pleased with cost of Anoro - was ~$120/3 month supply . Today, notes that his breathing is MUCH improved with Anoro. Notes he is requiring oxygen therapy less  frequently, is needing Duonebs less frequently, now only 2-4 times weekly . Currently note struggles with insomnia. Notes he thinks this is partially related to worsening nerve damage (s/p back surgery many years ago, notes a rod in his back and nerve damage in neck and back after that). Does acknowledge that he needs to "regulate" his sleep schedule; has been napping during the day often  o Notes that he discussed nerve pain with his podiatrist; he recommended soaking his feet in epsom salt baths; patient notes he has been doing this, not certain he has felt much benefit  Pharmacist Clinical Goal(s):  Marland Kitchen Over the next 90 days, patient will work with PharmD and provider towards optimized medication management  Interventions: . Congratulated patient on improvement in breathing symptoms with utilization of Anoro. Encouraged continued adherence and use of nebulizer PRN  . Discussed nerve pain in his legs. Consider evaluating for appropriateness of gabapentin therapy in the evenings  . Discussed principals of good sleep hygiene - avoid sleeping during the day, set a regular bed time, avoid caffeine in the afternoons (he notes he hasn't had caffeine products in over 2 months), avoid screen time before bed  . Collaborated w/ patient and Adair Laundry, CMA in scheduling follow up with Dr. Derrel Nip next month  Patient Self Care Activities:  . Patient will take medications as prescribed  Please see past updates related to this goal by clicking on the "Past Updates" button in the selected goal          Plan:  - Will outreach patient in ~5-6 weeks for continued medication management support  Catie Darnelle Maffucci, PharmD, [place] Pharmacist [place] [phone]

## 2019-10-30 ENCOUNTER — Ambulatory Visit: Payer: Self-pay | Admitting: Pharmacist

## 2019-10-30 DIAGNOSIS — J431 Panlobular emphysema: Secondary | ICD-10-CM

## 2019-10-30 NOTE — Chronic Care Management (AMB) (Signed)
Chronic Care Management   Follow Up Note   10/30/2019 Name: Victor Daniels MRN: [id number] DOB: 1946/06/02  Referred by: [name], MD Reason for referral : Chronic Care Management (Medication Management)   Victor Daniels is a 73 y.o. year old male who is a primary care patient of Tullo, Mar Daring, MD. The CCM team was consulted for assistance with chronic disease management and care coordination needs.    Received call from patient today regarding medication management needs.   Review of patient status, including review of consultants reports, relevant laboratory and other test results, and collaboration with appropriate care team members and the patient's provider was performed as part of comprehensive patient evaluation and provision of chronic care management services.    SDOH (Social Determinants of Health) screening performed today: Financial Strain . See Care Plan for related entries.   Outpatient Encounter Medications as of 10/30/2019  Medication Sig Note  . amLODipine (NORVASC) 5 MG tablet Take 1 tablet (5 mg total) by mouth daily.   Marland Kitchen aspirin 81 MG tablet Take 81 mg by mouth daily.   . carvedilol (COREG) 12.5 MG tablet TAKE 1 TABLET TWICE DAILY WITH MEALS   . furosemide (LASIX) 20 MG tablet Take 1 tablet (20 mg total) by mouth daily as needed.   Marland Kitchen gentamicin cream (GARAMYCIN) 0.1 % Apply 1 application topically 2 (two) times daily.   Marland Kitchen ipratropium (ATROVENT) 0.03 % nasal spray Place 2 sprays into both nostrils 2 (two) times daily. 09/04/2019: PRN  . ipratropium-albuterol (DUONEB) 0.5-2.5 (3) MG/3ML SOLN Take 3 mLs by nebulization every 6 (six) hours as needed (wheezing). DX: COPD DX Code: J44.9 10/06/2019: Using 1-2 times daily   . Lactulose 20 GM/30ML SOLN 30 ml every 4 hours until constipation is relieved 09/04/2019: PRN  . losartan-hydrochlorothiazide (HYZAAR) 100-25 MG tablet TAKE 1 TABLET EVERY DAY   . meloxicam (MOBIC) 7.5 MG tablet Take 1 tablet by  mouth once daily   . methocarbamol (ROBAXIN) 500 MG tablet Take 1 tablet (500 mg total) by mouth every 8 (eight) hours as needed for muscle spasms. 09/04/2019: PRN for neck spasms  . Multiple Vitamin (MULTIVITAMIN) capsule Take 1 capsule by mouth daily.   . nitroGLYCERIN (NITROSTAT) 0.4 MG SL tablet Place 1 tablet (0.4 mg total) under the tongue every 5 (five) minutes as needed for chest pain. Maximum dose 3 tablets   . oxyCODONE (ROXICODONE) 15 MG immediate release tablet Take 1 tablet (15 mg total) by mouth every 8 (eight) hours as needed for pain.   Marland Kitchen oxyCODONE (ROXICODONE) 15 MG immediate release tablet Take 1 tablet (15 mg total) by mouth every 8 (eight) hours as needed for pain.   Marland Kitchen oxyCODONE (ROXICODONE) 15 MG immediate release tablet Take 1 tablet (15 mg total) by mouth every 6 (six) hours as needed for pain.   . OXYGEN Inhale 2 L into the lungs 3 (three) times daily as needed (shortness of breath).    . simvastatin (ZOCOR) 20 MG tablet Take 1 tablet (20 mg total) by mouth at bedtime.   Marland Kitchen umeclidinium-vilanterol (ANORO ELLIPTA) 62.5-25 MCG/INH AEPB Inhale 1 puff into the lungs daily.   Marland Kitchen UNABLE TO FIND Med Name: Urozinc Prostate   . VENTOLIN HFA 108 (90 Base) MCG/ACT inhaler INHALE 2 PUFFS EVERY 6 HOURS AS NEEDED FOR WHEEZING OR SHORTNESS OF BREATH    No facility-administered encounter medications on file as of 10/30/2019.      Goals Addressed  This Visit's Progress     Patient Stated   . "My breathing is bad" (pt-stated)       Current Barriers:  . Financial concerns; resolved; patient was non-adherent to Brovana + Pulmicort d/t cost becoming excessive; also was tired of being "attached" to his nebulizer. Switched to [employer] + PRN Duonebs for breakthrough SOB o Notes he needs a refill on duonebs; notes that he filled a prescription a few months ago, and the box reads "No Refills"  o He also notes he has been adding some Brovana to his Duonebs nebulizer occasionally "for an  extra kick"  Pharmacist Clinical Goal(s):  Marland Kitchen Over the next 90 days, patient will work with PharmD and provider towards optimized medication management  Interventions: . Per chart review, Dr. Derrel Nip sent an updated prescription with refills to the pharmacy in 08/2019. Informed patient of this, recommended he contact the pharmacy and ask them to fill. He verbalized understanding . Counseled to d/c Brovana, noted that Anoro contains LABA which would be overlap with Brovana. He verbalized understanding  Patient Self Care Activities:  . Patient will take medications as prescribed  Please see past updates related to this goal by clicking on the "Past Updates" button in the selected goal          Plan: - Will outreach patient as scheduled  Catie Darnelle Maffucci, PharmD, [place] Pharmacist [place] [phone]

## 2019-10-30 NOTE — Patient Instructions (Signed)
Visit Information  Goals Addressed            This Visit's Progress     Patient Stated   . "My breathing is bad" (pt-stated)       Current Barriers:  . Financial concerns; resolved; patient was non-adherent to Brovana + Pulmicort d/t cost becoming excessive; also was tired of being "attached" to his nebulizer. Switched to [employer] + PRN Duonebs for breakthrough SOB o Notes he needs a refill on duonebs; notes that he filled a prescription a few months ago, and the box reads "No Refills"  o He also notes he has been adding some Brovana to his Duonebs nebulizer occasionally "for an extra kick"  Pharmacist Clinical Goal(s):  Marland Kitchen Over the next 90 days, patient will work with PharmD and provider towards optimized medication management  Interventions: . Per chart review, Dr. Derrel Nip sent an updated prescription with refills to the pharmacy in 08/2019. Informed patient of this, recommended he contact the pharmacy and ask them to fill. He verbalized understanding . Counseled to d/c Brovana, noted that Anoro contains LABA which would be overlap with Brovana. He verbalized understanding  Patient Self Care Activities:  . Patient will take medications as prescribed  Please see past updates related to this goal by clicking on the "Past Updates" button in the selected goal         The patient verbalized understanding of instructions provided today and declined a print copy of patient instruction materials.   Plan: - Will outreach patient as scheduled  Catie Darnelle Maffucci, PharmD, [place] Pharmacist [facility] [place] [phone]

## 2019-11-07 DIAGNOSIS — J449 Chronic obstructive pulmonary disease, unspecified: Secondary | ICD-10-CM | POA: Diagnosis not present

## 2019-11-13 ENCOUNTER — Telehealth: Payer: Medicare HMO

## 2019-11-19 ENCOUNTER — Encounter: Payer: Self-pay | Admitting: Internal Medicine

## 2019-11-19 ENCOUNTER — Telehealth: Payer: Self-pay | Admitting: Internal Medicine

## 2019-11-19 ENCOUNTER — Other Ambulatory Visit: Payer: Self-pay

## 2019-11-19 ENCOUNTER — Ambulatory Visit (INDEPENDENT_AMBULATORY_CARE_PROVIDER_SITE_OTHER): Payer: Medicare HMO | Admitting: Internal Medicine

## 2019-11-19 VITALS — BP 127/78 | Ht 72.0 in | Wt 293.0 lb

## 2019-11-19 DIAGNOSIS — M4722 Other spondylosis with radiculopathy, cervical region: Secondary | ICD-10-CM | POA: Diagnosis not present

## 2019-11-19 DIAGNOSIS — D508 Other iron deficiency anemias: Secondary | ICD-10-CM | POA: Diagnosis not present

## 2019-11-19 DIAGNOSIS — M25512 Pain in left shoulder: Secondary | ICD-10-CM | POA: Diagnosis not present

## 2019-11-19 DIAGNOSIS — L97522 Non-pressure chronic ulcer of other part of left foot with fat layer exposed: Secondary | ICD-10-CM | POA: Diagnosis not present

## 2019-11-19 DIAGNOSIS — E1159 Type 2 diabetes mellitus with other circulatory complications: Secondary | ICD-10-CM | POA: Diagnosis not present

## 2019-11-19 DIAGNOSIS — G8929 Other chronic pain: Secondary | ICD-10-CM

## 2019-11-19 DIAGNOSIS — E11621 Type 2 diabetes mellitus with foot ulcer: Secondary | ICD-10-CM

## 2019-11-19 DIAGNOSIS — J431 Panlobular emphysema: Secondary | ICD-10-CM | POA: Diagnosis not present

## 2019-11-19 DIAGNOSIS — I10 Essential (primary) hypertension: Secondary | ICD-10-CM | POA: Diagnosis not present

## 2019-11-19 MED ORDER — OXYCODONE HCL 15 MG PO TABS: 15.0000 mg | ORAL_TABLET | Freq: Three times a day (TID) | ORAL | 0 refills | Status: DC | PRN

## 2019-11-19 MED ORDER — OXYCODONE HCL 15 MG PO TABS: 15.0000 mg | ORAL_TABLET | Freq: Four times a day (QID) | ORAL | 0 refills | Status: DC | PRN

## 2019-11-19 NOTE — Telephone Encounter (Signed)
Pharmacy is needing clarification on directions of use for oxycodone. One rx says take 1 tablet every 8 hours PRN and the other 2 say take 1 tablet every 6 hours PRN. Which one is correct?

## 2019-11-19 NOTE — Telephone Encounter (Signed)
Pharmacy called in to be advised. They received 3 Rx's for oxyCODONE (ROXICODONE) 15 MG immediate release tablet, one had the directions of Take 1 tablet (15 mg total) by mouth every 8 (eight) hours as needed for pain, pharmacy would like to clarification   CB: [phone]
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
## 2019-11-19 NOTE — Telephone Encounter (Signed)
Attempted to call pharmacy. No answer. Will try them again on Monday to make sure they got the new corrected rx for January.

## 2019-11-19 NOTE — Telephone Encounter (Signed)
The correct dose is every 6 hours  With qty #120,   January rx has been  corrected and resent

## 2019-11-21 NOTE — Assessment & Plan Note (Signed)
Remains  well-controlled on diet alone . Patient is up-to-date on eye exams and now seeing podiatry for diabetic foot ulcer noted at last visit.  Patient has no microalbuminuria. Patient is tolerating statin therapy for CAD risk reduction and on ACE/ARB for renal protection and hypertension      Continue asa and statin.  He has no proteinuria and has annual eye exams. He has deferred the final Pneumovax vaccine.   Lab Results  Component Value Date   HGBA1C 6.2 07/22/2019   Lab Results  Component Value Date   MICROALBUR <0.7 07/22/2019

## 2019-11-21 NOTE — Assessment & Plan Note (Signed)
Patient has refused appointments after referrals have been made.  No further workups planned.  Refill history confirmed via [place] Controlled Substance databas, accessed by me today.Marland Kitchen

## 2019-11-21 NOTE — Assessment & Plan Note (Signed)
He is currently asymptomatic except with exertion

## 2019-11-21 NOTE — Assessment & Plan Note (Signed)
He has bilateral hand numbness aggravated by arthritis.Victor Daniels  He refuses any further workup given the perceived failure of his anterior cervical fusion in 2006

## 2019-11-21 NOTE — Assessment & Plan Note (Signed)
Well controlled on current regimen. Renal function stable, no changes today.  Lab Results  Component Value Date   CREATININE 1.03 07/22/2019

## 2019-11-21 NOTE — Assessment & Plan Note (Signed)
Secondary to neuropathy .  Managed by podiatry

## 2019-11-25 ENCOUNTER — Telehealth: Payer: Self-pay | Admitting: Pharmacist

## 2019-11-25 MED ORDER — AMLODIPINE BESYLATE 5 MG PO TABS: 5.0000 mg | ORAL_TABLET | Freq: Every day | ORAL | 1 refills | Status: DC

## 2019-11-25 NOTE — Telephone Encounter (Signed)
Refill for amlodipine sent to [facility].

## 2019-11-25 NOTE — Telephone Encounter (Signed)
Patient called, left me a voicemail requesting a refill on amlodipine be sent to [facility].   Routing to [employer], [place]

## 2019-11-28 ENCOUNTER — Other Ambulatory Visit: Payer: Self-pay | Admitting: Internal Medicine

## 2019-11-28 NOTE — Telephone Encounter (Signed)
Medication Refill - Medication:  methocarbamol (ROBAXIN) 500 MG tablet losartan-hydrochlorothiazide (HYZAAR) 100-25 MG tablet carvedilol (COREG) 12.5 MG tablet   Has the patient contacted their pharmacy? Yes - pt is switching pharmacies (Agent: If no, request that the patient contact the pharmacy for the refill.) (Agent: If yes, when and what did the pharmacy advise?)  Preferred Pharmacy (with phone number or street name):  [place], [place] [phone] (Phone) [phone] (Fax)   Agent: Please be advised that RX refills may take up to 3 business days. We ask that you follow-up with your pharmacy.

## 2019-11-28 NOTE — Telephone Encounter (Signed)
Forwarding medication refill request to PCP for review. 

## 2019-11-29 MED ORDER — LOSARTAN POTASSIUM-HCTZ 100-25 MG PO TABS: 1.0000 | ORAL_TABLET | Freq: Every day | ORAL | 1 refills | Status: DC

## 2019-11-29 MED ORDER — METHOCARBAMOL 500 MG PO TABS: 500.0000 mg | ORAL_TABLET | Freq: Three times a day (TID) | ORAL | 1 refills | Status: DC | PRN

## 2019-11-29 MED ORDER — CARVEDILOL 12.5 MG PO TABS: 12.5000 mg | ORAL_TABLET | Freq: Two times a day (BID) | ORAL | 1 refills | Status: DC

## 2019-12-02 ENCOUNTER — Telehealth: Payer: Self-pay | Admitting: Pharmacist

## 2019-12-02 MED ORDER — PREDNISONE 10 MG PO TABS: ORAL_TABLET | ORAL | 0 refills | Status: DC

## 2019-12-02 NOTE — Telephone Encounter (Signed)
Received voicemail from patient that he is experiencing "serious wheezing and dry cough", and wonders if Dr. Derrel Nip can prescribe a prednisone taper for him. He asked if I could ask Dr. Derrel Nip to do this.   Routing to clinical staff to outreach patient to discuss symptoms and likely need for COVID testing.

## 2019-12-02 NOTE — Telephone Encounter (Signed)
HE NEEDS COVID TESTING ASAP.   PREDNISONE SENT TO PHARMACY  60 MG DAILY FOR 3 DAYS, THEN START TAPER

## 2019-12-02 NOTE — Telephone Encounter (Signed)
Spoke with pt and he stated that 2 days ago he started wheezing, having a "dry nagging cough" and difficulty breathing. I told pt that Dr. Derrel Nip had called him in prednisone to take 60mg  once daily for 3 days and then start the prednisone taper. Also advised pt that he needs to go get Covid tested as soon as possible. Pt stated that he does not think he has the "virus" because he has not been out of the house in over a week and has only been around his wife who has been at home with him. Pt stated that he would go pick up his medication tomorrow and he might stop by the [facility] testing site. Pt stated that he is just having his normal breathing issues.

## 2019-12-07 DIAGNOSIS — J449 Chronic obstructive pulmonary disease, unspecified: Secondary | ICD-10-CM | POA: Diagnosis not present

## 2019-12-11 ENCOUNTER — Ambulatory Visit (INDEPENDENT_AMBULATORY_CARE_PROVIDER_SITE_OTHER): Payer: Medicare HMO | Admitting: Pharmacist

## 2019-12-11 DIAGNOSIS — I2583 Coronary atherosclerosis due to lipid rich plaque: Secondary | ICD-10-CM | POA: Diagnosis not present

## 2019-12-11 DIAGNOSIS — J431 Panlobular emphysema: Secondary | ICD-10-CM

## 2019-12-11 DIAGNOSIS — I2510 Atherosclerotic heart disease of native coronary artery without angina pectoris: Secondary | ICD-10-CM | POA: Diagnosis not present

## 2019-12-11 DIAGNOSIS — I5030 Unspecified diastolic (congestive) heart failure: Secondary | ICD-10-CM

## 2019-12-11 DIAGNOSIS — E782 Mixed hyperlipidemia: Secondary | ICD-10-CM | POA: Diagnosis not present

## 2019-12-11 MED ORDER — TRELEGY ELLIPTA 100-62.5-25 MCG/INH IN AEPB: 1.0000 | INHALATION_SPRAY | Freq: Every day | RESPIRATORY_TRACT | 1 refills | Status: DC

## 2019-12-11 NOTE — Patient Instructions (Signed)
Visit Information  Goals Addressed            This Visit's Progress     Patient Stated   . "My breathing is bad" (pt-stated)       Current Barriers:  . Polypharmacy; complex patient with multiple comorbidities including COPD (hx tobacco abuse); CHF (reduced but recovered, most recent 55%), CAD (hx MI, CVA x2), CKD (single kidney) o COPD: Anoro 62.5/25 mcg daily, albuterol HFA 3-4 times daily, duonebs at least once daily. Reported respiratory disease symptoms ~1 week ago, prescribed prednisone taper by Dr. Derrel Nip. Patient refused COVID testing. Notes today that he has lingering cough, and breathing continues to be concerning. Easily becomes SOB when walking. Previously saw Dr. Leonidas Romberg, then Dr. Humphrey Rolls, but not interested in following with either of them again. Notes that they all talk about needing to tx OSA w/ CPAP, but patient notes that sleep studies are too uncomfortable, and the mask did not fit properly when he last had one.  - Exacerbation hx: 2 in the past 4 months - Eosinophils: >300 on last CBC; patient may benefit from escalation to triple therapy w/ ICS o CHF/CAD: last f/u with Dr. Rockey Situ 08/14/2018, overdue for f/u. Carvedilol 12.5 mg BID; losartan 100/25 mg daily, amlodipine 5 mg daily o ASCVD risk reduction: ASA 81 mg daily, simvastatin 20 mg daily; last LDL >70; notes that he previously saw Dr. Lucky Cowboy, is thinking he may need to see that provider again given his poor circulation, and feet feeling cold. Noted this was a chronic feeling, not a recent development. Saw Podiatry 10/17/2019, pulses felt.  Pharmacist Clinical Goal(s):  Marland Kitchen Over the next 90 days, patient will work with PharmD and provider towards optimized medication management  Interventions: . Comprehensive medication review performed; medication list updated in electronic medical record . Given exacerbations and symptoms, will d/c Anoro and escalate to triple therapy w/ Trelegy. Patient in agreement. Will f/u next month to  assess tolerability and breathing. Recommended that patient consider re-establishing with a pulmonologist, given his poor pulmonary function, and consider restarting process to be fitting for CPAP, given risks of uncontrolled sleep apnea. Patient will consider this.  . Consider switching to high intensity statin to target goal LDL <70, given patient CV risk. Appears he was previously prescribed atorvastatin, but there was some miscommunication if he was to be on atorva or simva. Will discuss this w/ patient moving forward.   Patient Self Care Activities:  . Patient will take medications as prescribed  Please see past updates related to this goal by clicking on the "Past Updates" button in the selected goal         The patient verbalized understanding of instructions provided today and declined a print copy of patient instruction materials.    Plan: - Scheduled f/u in ~5 weeks (01/12/2020 at 3:30) for medication management review  Catie Darnelle Maffucci, PharmD, [place], [place] Pharmacist [place] [phone]

## 2019-12-11 NOTE — Chronic Care Management (AMB) (Signed)
Chronic Care Management   Follow Up Note   12/11/2019 Name: Victor Daniels MRN: [id number] DOB: [birth year]  Referred by: Crecencio Mc, MD Reason for referral : Chronic Care Management (Medication Management)   Victor Daniels is a 73 y.o. year old male who is a primary care patient of Tullo, Aris Everts, MD. The CCM team was consulted for assistance with chronic disease management and care coordination needs.    Contacted patient for medication management review.   Review of patient status, including review of consultants reports, relevant laboratory and other test results, and collaboration with appropriate care team members and the patient's provider was performed as part of comprehensive patient evaluation and provision of chronic care management services.    SDOH (Social Determinants of Health) screening performed today: Physical Activity. See Care Plan for related entries.   Outpatient Encounter Medications as of 12/11/2019  Medication Sig Note  . amLODipine (NORVASC) 5 MG tablet Take 1 tablet (5 mg total) by mouth daily. 12/11/2019: QAM  . aspirin 81 MG tablet Take 81 mg by mouth daily.   . carvedilol (COREG) 12.5 MG tablet Take 1 tablet (12.5 mg total) by mouth 2 (two) times daily with a meal.   . ipratropium (ATROVENT) 0.03 % nasal spray Place 2 sprays into both nostrils 2 (two) times daily. 09/04/2019: PRN  . ipratropium-albuterol (DUONEB) 0.5-2.5 (3) MG/3ML SOLN Take 3 mLs by nebulization every 6 (six) hours as needed (wheezing). DX: COPD DX Code: J44.9 12/11/2019: Using about once daily   . losartan-hydrochlorothiazide (HYZAAR) 100-25 MG tablet Take 1 tablet by mouth daily. 12/11/2019: QAM  . meloxicam (MOBIC) 7.5 MG tablet Take 1 tablet by mouth once daily   . methocarbamol (ROBAXIN) 500 MG tablet Take 1 tablet (500 mg total) by mouth every 8 (eight) hours as needed for muscle spasms. 12/11/2019: PRN  . Multiple Vitamin (MULTIVITAMIN) capsule Take 1 capsule by  mouth daily.   Derrill Memo ON 12/19/2019] oxyCODONE (ROXICODONE) 15 MG immediate release tablet Take 1 tablet (15 mg total) by mouth every 6 (six) hours as needed for pain.   . OXYGEN Inhale 2 L into the lungs 3 (three) times daily as needed (shortness of breath).    . predniSONE (DELTASONE) 10 MG tablet 6 tablets daily for 3 days,  then reduce by 1 tablet daily until gone   . simvastatin (ZOCOR) 20 MG tablet Take 1 tablet (20 mg total) by mouth at bedtime.   Marland Kitchen umeclidinium-vilanterol (ANORO ELLIPTA) 62.5-25 MCG/INH AEPB Inhale 1 puff into the lungs daily.   Marland Kitchen UNABLE TO FIND Med Name: Urozinc Prostate   . VENTOLIN HFA 108 (90 Base) MCG/ACT inhaler INHALE 2 PUFFS EVERY 6 HOURS AS NEEDED FOR WHEEZING OR SHORTNESS OF BREATH 12/11/2019: Using 4-5 times daily   . gentamicin cream (GARAMYCIN) 0.1 % Apply 1 application topically 2 (two) times daily. (Patient not taking: Reported on 12/11/2019)   . Lactulose 20 GM/30ML SOLN 30 ml every 4 hours until constipation is relieved (Patient not taking: Reported on 12/11/2019) 09/04/2019: PRN  . nitroGLYCERIN (NITROSTAT) 0.4 MG SL tablet Place 1 tablet (0.4 mg total) under the tongue every 5 (five) minutes as needed for chest pain. Maximum dose 3 tablets (Patient not taking: Reported on 12/11/2019)   . oxyCODONE (ROXICODONE) 15 MG immediate release tablet Take 1 tablet (15 mg total) by mouth every 6 (six) hours as needed for pain.   Derrill Memo ON 01/18/2020] oxyCODONE (ROXICODONE) 15 MG immediate release tablet Take 1 tablet (15  mg total) by mouth every 6 (six) hours as needed for pain.   . [DISCONTINUED] furosemide (LASIX) 20 MG tablet Take 1 tablet (20 mg total) by mouth daily as needed. (Patient not taking: Reported on 12/11/2019)    No facility-administered encounter medications on file as of 12/11/2019.     Goals Addressed            This Visit's Progress     Patient Stated   . "My breathing is bad" (pt-stated)       Current Barriers:  . Polypharmacy;  complex patient with multiple comorbidities including COPD (hx tobacco abuse); CHF (reduced but recovered, most recent 55%), CAD (hx MI, CVA x2), CKD (single kidney) o COPD: Anoro 62.5/25 mcg daily, albuterol HFA 3-4 times daily, duonebs at least once daily. Reported respiratory disease symptoms ~1 week ago, prescribed prednisone taper by Dr. Darrick Huntsman. Patient refused COVID testing. Notes today that he has lingering cough, and breathing continues to be concerning. Easily becomes SOB when walking. Previously saw Dr. Bard Herbert, then Dr. Welton Flakes, but not interested in following with either of them again. Notes that they all talk about needing to tx OSA w/ CPAP, but patient notes that sleep studies are too uncomfortable, and the mask did not fit properly when he last had one.  - Exacerbation hx: 2 in the past 4 months - Eosinophils: >300 on last CBC; patient may benefit from escalation to triple therapy w/ ICS o CHF/CAD: last f/u with Dr. Mariah Milling 08/14/2018, overdue for f/u. Carvedilol 12.5 mg BID; losartan 100/25 mg daily, amlodipine 5 mg daily o ASCVD risk reduction: ASA 81 mg daily, simvastatin 20 mg daily; last LDL >70; notes that he previously saw Dr. Wyn Quaker, is thinking he may need to see that provider again given his poor circulation, and feet feeling cold. Noted this was a chronic feeling, not a recent development. Saw Podiatry 10/17/2019, pulses felt.  Pharmacist Clinical Goal(s):  Marland Kitchen Over the next 90 days, patient will work with PharmD and provider towards optimized medication management  Interventions: . Comprehensive medication review performed; medication list updated in electronic medical record . Given exacerbations and symptoms, will d/c Anoro and escalate to triple therapy w/ Trelegy. Patient in agreement. Will f/u next month to assess tolerability and breathing. Recommended that patient consider re-establishing with a pulmonologist, given his poor pulmonary function, and consider restarting process to  be fitting for CPAP, given risks of uncontrolled sleep apnea. Patient will consider this.  . Consider switching to high intensity statin to target goal LDL <70, given patient CV risk. Appears he was previously prescribed atorvastatin, but there was some miscommunication if he was to be on atorva or simva. Will discuss this w/ patient moving forward.   Patient Self Care Activities:  . Patient will take medications as prescribed  Please see past updates related to this goal by clicking on the "Past Updates" button in the selected goal          Plan: - Scheduled f/u in ~5 weeks (01/12/2020 at 3:30) for medication management review  Catie Feliz Beam, PharmD, [place], CPP Clinical Pharmacist [facility] [employer] [phone]

## 2019-12-16 ENCOUNTER — Ambulatory Visit: Payer: Self-pay | Admitting: Pharmacist

## 2019-12-16 DIAGNOSIS — J431 Panlobular emphysema: Secondary | ICD-10-CM

## 2019-12-16 NOTE — Patient Instructions (Signed)
Visit Information  Goals Addressed            This Visit's Progress     Patient Stated   . "My breathing is bad" (pt-stated)       Current Barriers:  . Polypharmacy; complex patient with multiple comorbidities including COPD (hx tobacco abuse); CHF (reduced but recovered, most recent 55%), CAD (hx MI, CVA x2), CKD (single kidney) o COPD: Anoro 62.5/25 mcg daily, albuterol HFA 3-4 times daily, duonebs at least once daily. Reported respiratory disease symptoms ~1 week ago, prescribed prednisone taper by Dr. Derrel Nip. Patient refused COVID testing. Notes today that he has lingering cough, and breathing continues to be concerning. Easily becomes SOB when walking. Previously saw Dr. Leonidas Romberg, then Dr. Humphrey Rolls, but not interested in following with either of them again. Noted that they all talk about needing to tx OSA w/ CPAP, but patient notes that sleep studies are too uncomfortable, and the mask did not fit properly when he last had one.  - Exacerbation hx: 2 in the past 4 months - Eosinophils: >300 on last CBC; patient may benefit from escalation to triple therapy w/ ICS - Collaboratively decided to switch Trelegy. Patient called today noting that his copay was >$200 o CHF/CAD: last f/u with Dr. Rockey Situ 08/14/2018, overdue for f/u. Carvedilol 12.5 mg BID; losartan 100/25 mg daily, amlodipine 5 mg daily o ASCVD risk reduction: ASA 81 mg daily, simvastatin 20 mg daily; last LDL >70; notes that he previously saw Dr. Lucky Cowboy, is thinking he may need to see that provider again given his poor circulation, and feet feeling cold. Noted this was a chronic feeling, not a recent development. Saw Podiatry 10/17/2019, pulses felt.  Pharmacist Clinical Goal(s):  Marland Kitchen Over the next 90 days, patient will work with PharmD and provider towards optimized medication management  Interventions: . Research officer, trade union. Appears a 3 month supply was filled, which pushed patient into the Medicare Coverage Gap. They will fill a 30  day supply (for $45, same as the Anoro cost), and his total drug spend will reset in January.  Marland Kitchen Contacted patient, discussed the above. Explained that the same issue would occur if he had tried to fill a 3 month supply of the Anoro. He verbalized understanding, and will pick up the Trelegy in the next few days  Patient Self Care Activities:  . Patient will take medications as prescribed  Please see past updates related to this goal by clicking on the "Past Updates" button in the selected goal         The patient verbalized understanding of instructions provided today and declined a print copy of patient instruction materials.   Plan: - Will outreach patient for medication management follow up as scheduled  Catie Darnelle Maffucci, PharmD, [place] [phone]

## 2019-12-16 NOTE — Chronic Care Management (AMB) (Signed)
Chronic Care Management   Follow Up Note   12/16/2019 Name: Victor Daniels MRN: [id number] DOB: July 09, 1946  Referred by: Crecencio Mc, MD Reason for referral : Chronic Care Management (Medication Management)   Victor Daniels is a 73 y.o. year old male who is a primary care patient of Tullo, Aris Everts, MD. The CCM team was consulted for assistance with chronic disease management and care coordination needs.    Received call from patient today with medication access needs.   Review of patient status, including review of consultants reports, relevant laboratory and other test results, and collaboration with appropriate care team members and the patient's provider was performed as part of comprehensive patient evaluation and provision of chronic care management services.    SDOH (Social Determinants of Health) screening performed today: Financial Strain . See Care Plan for related entries.   Outpatient Encounter Medications as of 12/16/2019  Medication Sig Note  . amLODipine (NORVASC) 5 MG tablet Take 1 tablet (5 mg total) by mouth daily. 12/11/2019: QAM  . aspirin 81 MG tablet Take 81 mg by mouth daily.   . carvedilol (COREG) 12.5 MG tablet Take 1 tablet (12.5 mg total) by mouth 2 (two) times daily with a meal.   . Fluticasone-Umeclidin-Vilant (TRELEGY ELLIPTA) 100-62.5-25 MCG/INH AEPB Inhale 1 puff into the lungs daily.   Marland Kitchen gentamicin cream (GARAMYCIN) 0.1 % Apply 1 application topically 2 (two) times daily. (Patient not taking: Reported on 12/11/2019)   . ipratropium (ATROVENT) 0.03 % nasal spray Place 2 sprays into both nostrils 2 (two) times daily. 09/04/2019: PRN  . ipratropium-albuterol (DUONEB) 0.5-2.5 (3) MG/3ML SOLN Take 3 mLs by nebulization every 6 (six) hours as needed (wheezing). DX: COPD DX Code: J44.9 12/11/2019: Using about once daily   . Lactulose 20 GM/30ML SOLN 30 ml every 4 hours until constipation is relieved (Patient not taking: Reported on 12/11/2019)  09/04/2019: PRN  . losartan-hydrochlorothiazide (HYZAAR) 100-25 MG tablet Take 1 tablet by mouth daily. 12/11/2019: QAM  . meloxicam (MOBIC) 7.5 MG tablet Take 1 tablet by mouth once daily   . methocarbamol (ROBAXIN) 500 MG tablet Take 1 tablet (500 mg total) by mouth every 8 (eight) hours as needed for muscle spasms. 12/11/2019: PRN  . Multiple Vitamin (MULTIVITAMIN) capsule Take 1 capsule by mouth daily.   . nitroGLYCERIN (NITROSTAT) 0.4 MG SL tablet Place 1 tablet (0.4 mg total) under the tongue every 5 (five) minutes as needed for chest pain. Maximum dose 3 tablets (Patient not taking: Reported on 12/11/2019)   . [START ON 12/19/2019] oxyCODONE (ROXICODONE) 15 MG immediate release tablet Take 1 tablet (15 mg total) by mouth every 6 (six) hours as needed for pain.   Marland Kitchen oxyCODONE (ROXICODONE) 15 MG immediate release tablet Take 1 tablet (15 mg total) by mouth every 6 (six) hours as needed for pain.   Derrill Memo ON 01/18/2020] oxyCODONE (ROXICODONE) 15 MG immediate release tablet Take 1 tablet (15 mg total) by mouth every 6 (six) hours as needed for pain.   . OXYGEN Inhale 2 L into the lungs 3 (three) times daily as needed (shortness of breath).    . predniSONE (DELTASONE) 10 MG tablet 6 tablets daily for 3 days,  then reduce by 1 tablet daily until gone   . simvastatin (ZOCOR) 20 MG tablet Take 1 tablet (20 mg total) by mouth at bedtime.   Marland Kitchen UNABLE TO FIND Med Name: Urozinc Prostate   . VENTOLIN HFA 108 (90 Base) MCG/ACT inhaler INHALE 2 PUFFS  EVERY 6 HOURS AS NEEDED FOR WHEEZING OR SHORTNESS OF BREATH 12/11/2019: Using 4-5 times daily    No facility-administered encounter medications on file as of 12/16/2019.     Goals Addressed            This Visit's Progress     Patient Stated   . "My breathing is bad" (pt-stated)       Current Barriers:  . Polypharmacy; complex patient with multiple comorbidities including COPD (hx tobacco abuse); CHF (reduced but recovered, most recent 55%), CAD (hx  MI, CVA x2), CKD (single kidney) o COPD: Anoro 62.5/25 mcg daily, albuterol HFA 3-4 times daily, duonebs at least once daily. Reported respiratory disease symptoms ~1 week ago, prescribed prednisone taper by Dr. Darrick Huntsman. Patient refused COVID testing. Notes today that he has lingering cough, and breathing continues to be concerning. Easily becomes SOB when walking. Previously saw Dr. Bard Herbert, then Dr. Welton Flakes, but not interested in following with either of them again. Noted that they all talk about needing to tx OSA w/ CPAP, but patient notes that sleep studies are too uncomfortable, and the mask did not fit properly when he last had one.  - Exacerbation hx: 2 in the past 4 months - Eosinophils: >300 on last CBC; patient may benefit from escalation to triple therapy w/ ICS - Collaboratively decided to switch Trelegy. Patient called today noting that his copay was >$200 o CHF/CAD: last f/u with Dr. Mariah Milling 08/14/2018, overdue for f/u. Carvedilol 12.5 mg BID; losartan 100/25 mg daily, amlodipine 5 mg daily o ASCVD risk reduction: ASA 81 mg daily, simvastatin 20 mg daily; last LDL >70; notes that he previously saw Dr. Wyn Quaker, is thinking he may need to see that provider again given his poor circulation, and feet feeling cold. Noted this was a chronic feeling, not a recent development. Saw Podiatry 10/17/2019, pulses felt.  Pharmacist Clinical Goal(s):  Marland Kitchen Over the next 90 days, patient will work with PharmD and provider towards optimized medication management  Interventions: . Health and safety inspector. Appears a 3 month supply was filled, which pushed patient into the Medicare Coverage Gap. They will fill a 30 day supply (for $45, same as the Anoro cost), and his total drug spend will reset in January.  Marland Kitchen Contacted patient, discussed the above. Explained that the same issue would occur if he had tried to fill a 3 month supply of the Anoro. He verbalized understanding, and will pick up the Trelegy in the next few  days  Patient Self Care Activities:  . Patient will take medications as prescribed  Please see past updates related to this goal by clicking on the "Past Updates" button in the selected goal          Plan: - Will outreach patient for medication management follow up as scheduled  Catie Feliz Beam, PharmD, [facility] Clinical Pharmacist [facility] [phone]

## 2019-12-30 ENCOUNTER — Telehealth: Payer: Self-pay | Admitting: Pharmacist

## 2019-12-30 MED ORDER — MELOXICAM 7.5 MG PO TABS: 7.5000 mg | ORAL_TABLET | Freq: Every day | ORAL | 2 refills | Status: DC

## 2019-12-30 MED ORDER — MELOXICAM 7.5 MG PO TABS: 7.5000 mg | ORAL_TABLET | Freq: Every day | ORAL | 1 refills | Status: DC

## 2019-12-30 NOTE — Telephone Encounter (Signed)
Patient requests refill on meloxicam be sent to [facility]  He does not have refills on current prescription at [facility], or I would have counseled him to contact the pharmacy directly to request a transfer.

## 2019-12-30 NOTE — Telephone Encounter (Signed)
Left message letting pt know that the medication has been refilled and sent to [facility] order.

## 2020-01-07 DIAGNOSIS — J449 Chronic obstructive pulmonary disease, unspecified: Secondary | ICD-10-CM | POA: Diagnosis not present

## 2020-01-12 ENCOUNTER — Ambulatory Visit (INDEPENDENT_AMBULATORY_CARE_PROVIDER_SITE_OTHER): Payer: Medicare HMO | Admitting: Pharmacist

## 2020-01-12 DIAGNOSIS — J431 Panlobular emphysema: Secondary | ICD-10-CM

## 2020-01-12 DIAGNOSIS — E1159 Type 2 diabetes mellitus with other circulatory complications: Secondary | ICD-10-CM

## 2020-01-12 NOTE — Chronic Care Management (AMB) (Signed)
Chronic Care Management   Follow Up Note   01/12/2020 Name: Victor Daniels MRN: [id number] DOB: September 23, 1946  Referred by: [name], MD Reason for referral : Chronic Care Management (Medication Management )   Victor Daniels is a 74 y.o. year old male who is a primary care patient of Tullo, Mar Daring, MD. The CCM team was consulted for assistance with chronic disease management and care coordination needs.    Contacted patient for medication management review.   Review of patient status, including review of consultants reports, relevant laboratory and other test results, and collaboration with appropriate care team members and the patient's provider was performed as part of comprehensive patient evaluation and provision of chronic care management services.    SDOH (Social Determinants of Health) screening performed today: Stress. See Care Plan for related entries.   Outpatient Encounter Medications as of 01/12/2020  Medication Sig Note  . amLODipine (NORVASC) 5 MG tablet Take 1 tablet (5 mg total) by mouth daily. 12/11/2019: QAM  . aspirin 81 MG tablet Take 81 mg by mouth daily.   . carvedilol (COREG) 12.5 MG tablet Take 1 tablet (12.5 mg total) by mouth 2 (two) times daily with a meal.   . Fluticasone-Umeclidin-Vilant (TRELEGY ELLIPTA) 100-62.5-25 MCG/INH AEPB Inhale 1 puff into the lungs daily.   Marland Kitchen ipratropium (ATROVENT) 0.03 % nasal spray Place 2 sprays into both nostrils 2 (two) times daily. 09/04/2019: PRN  . ipratropium-albuterol (DUONEB) 0.5-2.5 (3) MG/3ML SOLN Take 3 mLs by nebulization every 6 (six) hours as needed (wheezing). DX: COPD DX Code: J44.9 01/12/2020: Using 2-4 times daily   . losartan-hydrochlorothiazide (HYZAAR) 100-25 MG tablet Take 1 tablet by mouth daily. 12/11/2019: QAM  . methocarbamol (ROBAXIN) 500 MG tablet Take 1 tablet (500 mg total) by mouth every 8 (eight) hours as needed for muscle spasms. 12/11/2019: PRN  . Multiple Vitamin (MULTIVITAMIN)  capsule Take 1 capsule by mouth daily.   . simvastatin (ZOCOR) 20 MG tablet Take 1 tablet (20 mg total) by mouth at bedtime.   Marland Kitchen gentamicin cream (GARAMYCIN) 0.1 % Apply 1 application topically 2 (two) times daily. (Patient not taking: Reported on 12/11/2019)   . Lactulose 20 GM/30ML SOLN 30 ml every 4 hours until constipation is relieved (Patient not taking: Reported on 12/11/2019) 09/04/2019: PRN  . meloxicam (MOBIC) 7.5 MG tablet Take 1 tablet (7.5 mg total) by mouth daily.   . nitroGLYCERIN (NITROSTAT) 0.4 MG SL tablet Place 1 tablet (0.4 mg total) under the tongue every 5 (five) minutes as needed for chest pain. Maximum dose 3 tablets (Patient not taking: Reported on 12/11/2019)   . oxyCODONE (ROXICODONE) 15 MG immediate release tablet Take 1 tablet (15 mg total) by mouth every 6 (six) hours as needed for pain.   Marland Kitchen oxyCODONE (ROXICODONE) 15 MG immediate release tablet Take 1 tablet (15 mg total) by mouth every 6 (six) hours as needed for pain.   Melene Muller ON 01/18/2020] oxyCODONE (ROXICODONE) 15 MG immediate release tablet Take 1 tablet (15 mg total) by mouth every 6 (six) hours as needed for pain.   . OXYGEN Inhale 2 L into the lungs 3 (three) times daily as needed (shortness of breath).    Ardelia Mems TO FIND Med Name: Urozinc Prostate   . VENTOLIN HFA 108 (90 Base) MCG/ACT inhaler INHALE 2 PUFFS EVERY 6 HOURS AS NEEDED FOR WHEEZING OR SHORTNESS OF BREATH 12/11/2019: Using 4-5 times daily   . [DISCONTINUED] predniSONE (DELTASONE) 10 MG tablet 6 tablets daily  for 3 days,  then reduce by 1 tablet daily until gone    No facility-administered encounter medications on file as of 01/12/2020.     Objective:   Goals Addressed            This Visit's Progress     Patient Stated   . "My breathing is bad" (pt-stated)       Current Barriers:  . Polypharmacy; complex patient with multiple comorbidities including COPD (hx tobacco abuse); CHF (reduced but recovered, most recent 55%), CAD (hx MI, CVA  x2), CKD (single kidney) . Most significant concern today is increased fatigue over the past few weeks, which he related to having to wake up every 45-60 minutes to use the restroom. Endorses some issues starting and stopping his stream. Denies polydipsia/phagia. Endorses lack of appetite, but denies weight loss.  o COPD: Trelegy daily, albuterol HFA or Duonebs TID-QID. Notes that Trelegy at first seemed to provide extra benefit over Anoro, but now is unsure. Previously saw Dr. Leonidas Romberg, then Dr. Humphrey Rolls, but not interested in following with either of them again. Noted that they all talk about needing to tx OSA w/ CPAP, but patient notes that sleep studies are too uncomfortable, and the mask did not fit properly when he last had one.  o CHF/CAD: last f/u with Dr. Rockey Situ 08/14/2018, overdue for f/u. Carvedilol 12.5 mg BID; losartan 100/25 mg daily, amlodipine 5 mg daily o ASCVD risk reduction: ASA 81 mg daily, simvastatin 20 mg daily; last LDL >70; previously saw Dr. Lucky Cowboy   Pharmacist Clinical Goal(s):  Marland Kitchen Over the next 90 days, patient will work with PharmD and provider towards optimized medication management  Interventions: . Comprehensive medication review performed, medication list updated in electronic medical record.  . Reviewed patient symptoms. He notes that he tried an OTC sleep aid recommended by the pharmacist, but cannot remember which. Denies that it was melatonin. I wonder if it was diphenhydramine/doxylamine; anticholinergic effects likely to exacerbate underlying BPH symptoms. Messaged Dr. Derrel Nip regarding her recommendations, as well as to see when she would like him to have lab checked that she ordered in November. Encouraged patient to contact urologist Dr. Bernardo Heater. I will also route my note to him.  Patient Self Care Activities:  . Patient will take medications as prescribed  Please see past updates related to this goal by clicking on the "Past Updates" button in the selected goal          Plan: - Will collaborate w/ providers as above - Scheduled outreach call to patient 02/16/20  Catie Darnelle Maffucci, PharmD, [place], [place] Pharmacist [place] [phone]

## 2020-01-12 NOTE — Patient Instructions (Signed)
Visit Information  Goals Addressed            This Visit's Progress     Patient Stated   . "My breathing is bad" (pt-stated)       Current Barriers:  . Polypharmacy; complex patient with multiple comorbidities including COPD (hx tobacco abuse); CHF (reduced but recovered, most recent 55%), CAD (hx MI, CVA x2), CKD (single kidney) . Most significant concern today is increased fatigue over the past few weeks, which he related to having to wake up every 45-60 minutes to use the restroom. Endorses some issues starting and stopping his stream. Denies polydipsia/phagia. Endorses lack of appetite, but denies weight loss.  o COPD: Trelegy daily, albuterol HFA or Duonebs TID-QID. Notes that Trelegy at first seemed to provide extra benefit over Anoro, but now is unsure. Previously saw Dr. Bard Herbert, then Dr. Welton Flakes, but not interested in following with either of them again. Noted that they all talk about needing to tx OSA w/ CPAP, but patient notes that sleep studies are too uncomfortable, and the mask did not fit properly when he last had one.  o CHF/CAD: last f/u with Dr. Mariah Milling 08/14/2018, overdue for f/u. Carvedilol 12.5 mg BID; losartan 100/25 mg daily, amlodipine 5 mg daily o ASCVD risk reduction: ASA 81 mg daily, simvastatin 20 mg daily; last LDL >70; previously saw Dr. Wyn Quaker   Pharmacist Clinical Goal(s):  Marland Kitchen Over the next 90 days, patient will work with PharmD and provider towards optimized medication management  Interventions: . Comprehensive medication review performed, medication list updated in electronic medical record.  . Reviewed patient symptoms. He notes that he tried an OTC sleep aid recommended by the pharmacist, but cannot remember which. Denies that it was melatonin. I wonder if it was diphenhydramine/doxylamine; anticholinergic effects likely to exacerbate underlying BPH symptoms. Messaged Dr. Darrick Huntsman regarding her recommendations, as well as to see when she would like him to have lab  checked that she ordered in November. Encouraged patient to contact urologist Dr. Lonna Cobb. I will also route my note to him.  Patient Self Care Activities:  . Patient will take medications as prescribed  Please see past updates related to this goal by clicking on the "Past Updates" button in the selected goal         The patient verbalized understanding of instructions provided today and declined a print copy of patient instruction materials.   Plan: - Will collaborate w/ providers as above - Scheduled outreach call to patient 02/16/20  Catie Feliz Beam, PharmD, [place], CPP Clinical Pharmacist [facility] [employer] [phone]

## 2020-01-13 ENCOUNTER — Telehealth: Payer: Self-pay

## 2020-01-13 ENCOUNTER — Other Ambulatory Visit: Payer: Self-pay | Admitting: Internal Medicine

## 2020-01-13 DIAGNOSIS — R350 Frequency of micturition: Secondary | ICD-10-CM

## 2020-01-13 NOTE — Telephone Encounter (Signed)
Per [employer] Patient reports he has been more tired over the past few weeks, because he isn't sleeping, because he's been having to go to the bathroom every 45-60 minutes at night. Denies polydipsia/phagia. Endorses decreased appetite, but no weight loss. I recommended he call urology to discuss symptoms. You had scheduled lab work for him after his 11/19/2019 appt, but he never came in for anything. You have follow up scheduled in March. Do you want [place] to go ahead and get him in for lab work now? Some labs now but not everything? Or defer until before your March appt?  Per Dr. Darrick Huntsman his urinary frequency is not new he has bph and has a urologist. Eustace Pen was normal in October done here for same complaint. he should come in and get his labs becuase they are overdue and keep the appt in march with me but follow up with urologist Now  Sandy Salaam, CMA ok i spoke with pt he is scheduled for lab appt on Thursday, the soonest he could come and he stated that he would call urology to schedule a follow up appt.
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
## 2020-01-15 ENCOUNTER — Other Ambulatory Visit: Payer: Medicare HMO

## 2020-01-15 ENCOUNTER — Ambulatory Visit: Payer: Medicare HMO | Admitting: Urology

## 2020-01-22 ENCOUNTER — Other Ambulatory Visit: Payer: Medicare HMO

## 2020-01-22 ENCOUNTER — Ambulatory Visit: Payer: Medicare HMO | Admitting: Urology

## 2020-02-07 DIAGNOSIS — J449 Chronic obstructive pulmonary disease, unspecified: Secondary | ICD-10-CM | POA: Diagnosis not present

## 2020-02-16 ENCOUNTER — Other Ambulatory Visit: Payer: Self-pay | Admitting: Pharmacist

## 2020-02-16 ENCOUNTER — Other Ambulatory Visit: Payer: Self-pay

## 2020-02-16 ENCOUNTER — Telehealth: Payer: Medicare HMO

## 2020-02-16 ENCOUNTER — Ambulatory Visit: Payer: Self-pay | Admitting: Pharmacist

## 2020-02-16 DIAGNOSIS — J431 Panlobular emphysema: Secondary | ICD-10-CM

## 2020-02-16 MED ORDER — TRELEGY ELLIPTA 100-62.5-25 MCG/INH IN AEPB: 1.0000 | INHALATION_SPRAY | Freq: Every day | RESPIRATORY_TRACT | 1 refills | Status: DC

## 2020-02-16 NOTE — Chronic Care Management (AMB) (Signed)
  Chronic Care Management   Note  02/16/2020 Name: JOVANTE HAMMITT MRN: [id number] DOB: 1946/04/07  SHAWAN CORELLA is a 74 y.o. year old male who is a primary care patient of Darrick Huntsman, Mar Daring, MD. The CCM team was consulted for assistance with chronic disease management and care coordination needs.    Attempted to contact patient for medication management review. Left HIPAA compliant message for patient to return my call at his convenience.   Follow up plan: - Will collaborate w/ Care Guide to outreach patient to schedule a follow up appointment with me.  Catie Feliz Beam, PharmD, [place], CPP Clinical Pharmacist [facility] [place] [employer] [phone]

## 2020-02-16 NOTE — Telephone Encounter (Signed)
Patient left me a voicemail before the beginning of business today requesting a refill on albuterol and oxycodone.   When I called patient for our visit today, I noted in my voicemail that he needs to call his pharmacy for refills, as I am not consistently covering this office and cannot check my voicemail as frequently as the office can answer calls.

## 2020-02-17 ENCOUNTER — Telehealth: Payer: Self-pay | Admitting: Internal Medicine

## 2020-02-17 MED ORDER — VENTOLIN HFA 108 (90 BASE) MCG/ACT IN AERS: INHALATION_SPRAY | RESPIRATORY_TRACT | 2 refills | Status: DC

## 2020-02-17 MED ORDER — OXYCODONE HCL 15 MG PO TABS: 15.0000 mg | ORAL_TABLET | Freq: Four times a day (QID) | ORAL | 0 refills | Status: DC | PRN

## 2020-02-17 NOTE — Telephone Encounter (Signed)
Routing to scheduling Care Guide

## 2020-02-17 NOTE — Telephone Encounter (Signed)
Appt scheduled for 03/08/2020.

## 2020-02-17 NOTE — Chronic Care Management (AMB) (Signed)
°  Care Management   Note  02/17/2020 Name: Victor Daniels MRN: [id number] DOB: 1946/11/20  Victor Daniels is a 74 y.o. year old male who is a primary care patient of Darrick Huntsman, Mar Daring, MD and is actively engaged with the care management team. I reached out to [name] by phone today to assist with re-scheduling a follow up visit with the Pharmacist  Follow up plan: Unsuccessful telephone outreach attempt made. A HIPPA compliant phone message was left for the patient providing contact information and requesting a return call.  The care management team will reach out to the patient again over the next 7 days.  If patient returns call to provider office, please advise to call Embedded Care Management Care Guide Penne Lash  at [phone]  Penne Lash, RMA Care Guide, Embedded Care Coordination [facility]  [place], [place] [address] Direct Dial: [phone] [email or web address]: [email or web address]

## 2020-02-17 NOTE — Telephone Encounter (Signed)
Refilled: 01/18/2020 Last OV: 11/19/2019 Next OV: 03/08/2020

## 2020-02-17 NOTE — Telephone Encounter (Signed)
Pt called wanting to schedule a follow up appt with Catie

## 2020-02-17 NOTE — Chronic Care Management (AMB) (Signed)
  Care Management   Note  02/17/2020 Name: Victor Daniels MRN: [id number] DOB: 01-25-1946  [name] is a 74 y.o. year old male who is a primary care patient of Darrick Huntsman, Mar Daring, MD and is actively engaged with the care management team. I reached out to [name] by phone today to assist with re-scheduling a follow up visit with the Pharmacist  Follow up plan: Telephone appointment with care management team member scheduled for:03/25/2020  Penne Lash, RMA Care Guide, Embedded Care Coordination [facility]  [place], [place] [address] Direct Dial: [phone] [email or web address]: [email or web address]

## 2020-03-02 ENCOUNTER — Telehealth: Payer: Self-pay | Admitting: Internal Medicine

## 2020-03-02 ENCOUNTER — Ambulatory Visit: Payer: Self-pay | Admitting: Pharmacist

## 2020-03-02 NOTE — Chronic Care Management (AMB) (Signed)
  Chronic Care Management   Note  03/02/2020 Name: Victor Daniels MRN: [id number] DOB: [birth year]  Victor Daniels is a 74 y.o. year old male who is a primary care patient of Tullo, Mar Daring, MD. The CCM team was consulted for assistance with chronic disease management and care coordination needs.    Patient left me a voicemail asking about a portable oxygen machine; I see he also called into the office about this. I attempted to call him back, but it went to VM; I left him a message that he did the correct thing calling the office for the prescription/order, and to wait for f/u from Dr. Tandy Gaw on that.   Follow up plan: - Will outreach next month as previously scheduled  Catie Feliz Beam, PharmD, [facility] Clinical Pharmacist [facility] [phone]

## 2020-03-02 NOTE — Telephone Encounter (Signed)
Pt is wanting to get portable oxygen machine from [employer]. He said they told him Dr. Darrick Huntsman would need to send them the ok.

## 2020-03-03 ENCOUNTER — Other Ambulatory Visit: Payer: Self-pay

## 2020-03-03 NOTE — Telephone Encounter (Signed)
Are you okay with pt having portable oxygen? If so I will fill out the form.

## 2020-03-03 NOTE — Telephone Encounter (Signed)
Yes, he needs it

## 2020-03-04 NOTE — Telephone Encounter (Signed)
Form has been faxed and pt is aware.  

## 2020-03-06 DIAGNOSIS — J449 Chronic obstructive pulmonary disease, unspecified: Secondary | ICD-10-CM | POA: Diagnosis not present

## 2020-03-08 ENCOUNTER — Encounter: Payer: Self-pay | Admitting: Internal Medicine

## 2020-03-08 ENCOUNTER — Other Ambulatory Visit: Payer: Self-pay

## 2020-03-08 ENCOUNTER — Ambulatory Visit (INDEPENDENT_AMBULATORY_CARE_PROVIDER_SITE_OTHER): Payer: Medicare HMO | Admitting: Internal Medicine

## 2020-03-08 ENCOUNTER — Ambulatory Visit (INDEPENDENT_AMBULATORY_CARE_PROVIDER_SITE_OTHER): Payer: Medicare HMO

## 2020-03-08 VITALS — Ht 72.0 in | Wt 293.0 lb

## 2020-03-08 VITALS — BP 132/80 | Temp 98.4°F | Ht 72.0 in | Wt 290.0 lb

## 2020-03-08 DIAGNOSIS — D649 Anemia, unspecified: Secondary | ICD-10-CM

## 2020-03-08 DIAGNOSIS — J300 Vasomotor rhinitis: Secondary | ICD-10-CM

## 2020-03-08 DIAGNOSIS — E1159 Type 2 diabetes mellitus with other circulatory complications: Secondary | ICD-10-CM

## 2020-03-08 DIAGNOSIS — Z0000 Encounter for general adult medical examination without abnormal findings: Secondary | ICD-10-CM

## 2020-03-08 DIAGNOSIS — J431 Panlobular emphysema: Secondary | ICD-10-CM | POA: Diagnosis not present

## 2020-03-08 DIAGNOSIS — E782 Mixed hyperlipidemia: Secondary | ICD-10-CM

## 2020-03-08 DIAGNOSIS — D510 Vitamin B12 deficiency anemia due to intrinsic factor deficiency: Secondary | ICD-10-CM | POA: Diagnosis not present

## 2020-03-08 DIAGNOSIS — M4722 Other spondylosis with radiculopathy, cervical region: Secondary | ICD-10-CM

## 2020-03-08 DIAGNOSIS — R5383 Other fatigue: Secondary | ICD-10-CM | POA: Diagnosis not present

## 2020-03-08 DIAGNOSIS — I10 Essential (primary) hypertension: Secondary | ICD-10-CM

## 2020-03-08 MED ORDER — OXYCODONE HCL 15 MG PO TABS: 15.0000 mg | ORAL_TABLET | Freq: Four times a day (QID) | ORAL | 0 refills | Status: DC | PRN

## 2020-03-08 NOTE — Patient Instructions (Addendum)
  Mr. Jelinski , Thank you for taking time to come for your Medicare Wellness Visit. I appreciate your ongoing commitment to your health goals. Please review the following plan we discussed and let me know if I can assist you in the future.   These are the goals we discussed: Goals      Patient Stated   . "My breathing is bad" (pt-stated)     Current Barriers:  . Polypharmacy; complex patient with multiple comorbidities including COPD (hx tobacco abuse); CHF (reduced but recovered, most recent 55%), CAD (hx MI, CVA x2), CKD (single kidney) . Most significant concern today is increased fatigue over the past few weeks, which he related to having to wake up every 45-60 minutes to use the restroom. Endorses some issues starting and stopping his stream. Denies polydipsia/phagia. Endorses lack of appetite, but denies weight loss.  o COPD: Trelegy daily, albuterol HFA or Duonebs TID-QID. Notes that Trelegy at first seemed to provide extra benefit over Anoro, but now is unsure. Previously saw Dr. Bard Herbert, then Dr. Welton Flakes, but not interested in following with either of them again. Noted that they all talk about needing to tx OSA w/ CPAP, but patient notes that sleep studies are too uncomfortable, and the mask did not fit properly when he last had one.  o CHF/CAD: last f/u with Dr. Mariah Milling 08/14/2018, overdue for f/u. Carvedilol 12.5 mg BID; losartan 100/25 mg daily, amlodipine 5 mg daily o ASCVD risk reduction: ASA 81 mg daily, simvastatin 20 mg daily; last LDL >70; previously saw Dr. Wyn Quaker   Pharmacist Clinical Goal(s):  Marland Kitchen Over the next 90 days, patient will work with PharmD and provider towards optimized medication management  Interventions: . Comprehensive medication review performed, medication list updated in electronic medical record.  . Reviewed patient symptoms. He notes that he tried an OTC sleep aid recommended by the pharmacist, but cannot remember which. Denies that it was melatonin. I wonder  if it was diphenhydramine/doxylamine; anticholinergic effects likely to exacerbate underlying BPH symptoms. Messaged Dr. Darrick Huntsman regarding her recommendations, as well as to see when she would like him to have lab checked that she ordered in November. Encouraged patient to contact urologist Dr. Lonna Cobb. I will also route my note to him.  Patient Self Care Activities:  . Patient will take medications as prescribed  Please see past updates related to this goal by clicking on the "Past Updates" button in the selected goal      . DIET - REDUCE SUGAR INTAKE (pt-stated)       This is a list of the screening recommended for you and due dates:  Health Maintenance  Topic Date Due  . Complete foot exam   03/14/2019  . Cologuard (Stool DNA test)  03/21/2019  . Hemoglobin A1C  01/22/2020  . Flu Shot  03/24/2020*  . Eye exam for diabetics  09/08/2020  . Tetanus Vaccine  05/25/2025  .  Hepatitis C: One time screening is recommended by Center for Disease Control  (CDC) for  adults born from [birth year] through 1965.   Completed  . Pneumonia vaccines  Completed  *Topic was postponed. The date shown is not the original due date.

## 2020-03-08 NOTE — Assessment & Plan Note (Signed)
Continue sublingual supplementation per patient preference

## 2020-03-08 NOTE — Assessment & Plan Note (Signed)
Well controlled for age on current regimen based on home readings of 132/80. Renal function is due,  no changes today.

## 2020-03-08 NOTE — Assessment & Plan Note (Signed)
Oxygen requiringe both at rest and with exertion.  He neds portable tank that  carries > 1 hour of oxygen

## 2020-03-08 NOTE — Assessment & Plan Note (Signed)
He has bilateral hand numbness aggravated by arthritis.Marland Kitchen  He refuses any further workup given the perceived failure of his anterior cervical fusion in 2006 continue oxycodone 15 mg every 6 hours prn  Refills for 3 months given

## 2020-03-08 NOTE — Assessment & Plan Note (Signed)
More troublesome lately.  Add antihistamine.  Continue duonebs
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
## 2020-03-09 ENCOUNTER — Ambulatory Visit: Payer: Medicare HMO | Admitting: Physician Assistant

## 2020-03-12 ENCOUNTER — Ambulatory Visit: Payer: Medicare HMO | Admitting: Physician Assistant

## 2020-03-15 ENCOUNTER — Other Ambulatory Visit: Payer: Self-pay

## 2020-03-15 ENCOUNTER — Other Ambulatory Visit (INDEPENDENT_AMBULATORY_CARE_PROVIDER_SITE_OTHER): Payer: Medicare HMO

## 2020-03-15 DIAGNOSIS — R5383 Other fatigue: Secondary | ICD-10-CM | POA: Diagnosis not present

## 2020-03-15 DIAGNOSIS — E782 Mixed hyperlipidemia: Secondary | ICD-10-CM

## 2020-03-15 DIAGNOSIS — E1159 Type 2 diabetes mellitus with other circulatory complications: Secondary | ICD-10-CM

## 2020-03-15 LAB — COMPREHENSIVE METABOLIC PANEL
ALT: 15 U/L (ref 0–53)
AST: 15 U/L (ref 0–37)
Albumin: 3.2 g/dL — ABNORMAL LOW (ref 3.5–5.2)
Alkaline Phosphatase: 84 U/L (ref 39–117)
BUN: 25 mg/dL — ABNORMAL HIGH (ref 6–23)
CO2: 35 mEq/L — ABNORMAL HIGH (ref 19–32)
Calcium: 9.3 mg/dL (ref 8.4–10.5)
Chloride: 98 mEq/L (ref 96–112)
Creatinine, Ser: 1.63 mg/dL — ABNORMAL HIGH (ref 0.40–1.50)
GFR: 41.63 mL/min — ABNORMAL LOW (ref >60.00)
Glucose, Bld: 133 mg/dL — ABNORMAL HIGH (ref 70–99)
Potassium: 3.7 mEq/L (ref 3.5–5.1)
Sodium: 142 mEq/L (ref 135–145)
Total Bilirubin: 0.8 mg/dL (ref 0.2–1.2)
Total Protein: 7.3 g/dL (ref 6.0–8.3)

## 2020-03-15 LAB — HEMOGLOBIN A1C: Hgb A1c MFr Bld: 6.6 % — ABNORMAL HIGH (ref 4.6–6.5)

## 2020-03-15 LAB — CBC WITH DIFFERENTIAL/PLATELET
Basophils Absolute: 0.1 10*3/uL (ref 0.0–0.1)
Basophils Relative: 0.8 % (ref 0.0–3.0)
Eosinophils Absolute: 0.3 10*3/uL (ref 0.0–0.7)
Eosinophils Relative: 2.9 % (ref 0.0–5.0)
HCT: 32.8 % — ABNORMAL LOW (ref 39.0–52.0)
Hemoglobin: 10.6 g/dL — ABNORMAL LOW (ref 13.0–17.0)
Lymphocytes Relative: 15.5 % (ref 12.0–46.0)
Lymphs Abs: 1.6 10*3/uL (ref 0.7–4.0)
MCHC: 32.2 g/dL (ref 30.0–36.0)
MCV: 86.8 fl (ref 78.0–100.0)
Monocytes Absolute: 0.8 10*3/uL (ref 0.1–1.0)
Monocytes Relative: 8.4 % (ref 3.0–12.0)
Neutro Abs: 7.2 10*3/uL (ref 1.4–7.7)
Neutrophils Relative %: 72.4 % (ref 43.0–77.0)
Platelets: 226 10*3/uL (ref 150.0–400.0)
RBC: 3.77 Mil/uL — ABNORMAL LOW (ref 4.22–5.81)
RDW: 16 % — ABNORMAL HIGH (ref 11.5–15.5)
WBC: 10 10*3/uL (ref 4.0–10.5)

## 2020-03-15 LAB — LIPID PANEL
Cholesterol: 123 mg/dL (ref 0–200)
HDL: 38.8 mg/dL — ABNORMAL LOW (ref >39.00)
LDL Cholesterol: 63 mg/dL (ref 0–99)
NonHDL: 84.07
Total CHOL/HDL Ratio: 3
Triglycerides: 104 mg/dL (ref 0.0–149.0)
VLDL: 20.8 mg/dL (ref 0.0–40.0)

## 2020-03-15 LAB — IBC + FERRITIN
Ferritin: 182.2 ng/mL (ref 22.0–322.0)
Iron: 39 ug/dL — ABNORMAL LOW (ref 42–165)
Saturation Ratios: 17.2 % — ABNORMAL LOW (ref 20.0–50.0)
Transferrin: 162 mg/dL — ABNORMAL LOW (ref 212.0–360.0)

## 2020-03-15 LAB — TSH: TSH: 1.52 u[IU]/mL (ref 0.35–4.50)

## 2020-03-25 ENCOUNTER — Ambulatory Visit (INDEPENDENT_AMBULATORY_CARE_PROVIDER_SITE_OTHER): Payer: Medicare HMO | Admitting: Pharmacist

## 2020-03-25 DIAGNOSIS — J431 Panlobular emphysema: Secondary | ICD-10-CM

## 2020-03-25 DIAGNOSIS — I10 Essential (primary) hypertension: Secondary | ICD-10-CM | POA: Diagnosis not present

## 2020-03-25 DIAGNOSIS — D649 Anemia, unspecified: Secondary | ICD-10-CM | POA: Diagnosis not present

## 2020-03-25 NOTE — Chronic Care Management (AMB) (Signed)
Chronic Care Management   Follow Up Note   03/25/2020 Name: ORVEL CUTSFORTH MRN: [id number] DOB: [birth year]  Referred by: Crecencio Mc, MD Reason for referral : Chronic Care Management (Medication Management)   DAMARI SUASTEGUI is a 74 y.o. year old male who is a primary care patient of Tullo, Aris Everts, MD. The CCM team was consulted for assistance with chronic disease management and care coordination needs.    Contacted patient for medication management review.   Review of patient status, including review of consultants reports, relevant laboratory and other test results, and collaboration with appropriate care team members and the patient's provider was performed as part of comprehensive patient evaluation and provision of chronic care management services.    SDOH (Social Determinants of Health) assessments performed: No See Care Plan activities for detailed interventions related to [facility])     Outpatient Encounter Medications as of 03/25/2020  Medication Sig Note  . amLODipine (NORVASC) 5 MG tablet Take 1 tablet (5 mg total) by mouth daily. 12/11/2019: QAM  . aspirin 81 MG tablet Take 81 mg by mouth daily.   . carvedilol (COREG) 12.5 MG tablet Take 1 tablet (12.5 mg total) by mouth 2 (two) times daily with a meal.   . Fluticasone-Umeclidin-Vilant (TRELEGY ELLIPTA) 100-62.5-25 MCG/INH AEPB Inhale 1 puff into the lungs daily.   Marland Kitchen ipratropium (ATROVENT) 0.03 % nasal spray Place 2 sprays into both nostrils 2 (two) times daily. 09/04/2019: PRN  . ipratropium-albuterol (DUONEB) 0.5-2.5 (3) MG/3ML SOLN Take 3 mLs by nebulization every 6 (six) hours as needed (wheezing). DX: COPD DX Code: J44.9 01/12/2020: Using 2-4 times daily   . losartan-hydrochlorothiazide (HYZAAR) 100-25 MG tablet Take 1 tablet by mouth daily. 12/11/2019: QAM  . Multiple Vitamin (MULTIVITAMIN) capsule Take 1 capsule by mouth daily.   Derrill Memo ON 04/15/2020] oxyCODONE (ROXICODONE) 15 MG immediate release tablet  Take 1 tablet (15 mg total) by mouth every 6 (six) hours as needed for pain.   . OXYGEN Inhale 2 L into the lungs 3 (three) times daily as needed (shortness of breath).    . simvastatin (ZOCOR) 20 MG tablet Take 1 tablet (20 mg total) by mouth at bedtime.   . vitamin B-12 (CYANOCOBALAMIN) 1000 MCG tablet Take 1,000 mcg by mouth daily.   Marland Kitchen gentamicin cream (GARAMYCIN) 0.1 % Apply 1 application topically 2 (two) times daily. (Patient not taking: Reported on 12/11/2019)   . methocarbamol (ROBAXIN) 500 MG tablet Take 1 tablet (500 mg total) by mouth every 8 (eight) hours as needed for muscle spasms. (Patient not taking: Reported on 03/25/2020) 12/11/2019: PRN  . nitroGLYCERIN (NITROSTAT) 0.4 MG SL tablet Place 1 tablet (0.4 mg total) under the tongue every 5 (five) minutes as needed for chest pain. Maximum dose 3 tablets   . oxyCODONE (ROXICODONE) 15 MG immediate release tablet Take 1 tablet (15 mg total) by mouth every 6 (six) hours as needed for pain.   Derrill Memo ON 05/15/2020] oxyCODONE (ROXICODONE) 15 MG immediate release tablet Take 1 tablet (15 mg total) by mouth every 6 (six) hours as needed for pain.   . VENTOLIN HFA 108 (90 Base) MCG/ACT inhaler INHALE 2 PUFFS EVERY 6 HOURS AS NEEDED FOR WHEEZING OR SHORTNESS OF BREATH   . [DISCONTINUED] meloxicam (MOBIC) 7.5 MG tablet Take 1 tablet (7.5 mg total) by mouth daily. (Patient not taking: Reported on 03/25/2020)   . [DISCONTINUED] UNABLE TO FIND Med Name: Urozinc Prostate    No facility-administered encounter medications on file as  of 03/25/2020.     Objective:   Goals Addressed            This Visit's Progress     Patient Stated   . "My breathing is bad" (pt-stated)       CARE PLAN ENTRY (see longtitudinal plan of care for additional care plan information)  Current Barriers:  . Polypharmacy; complex patient with multiple comorbidities including COPD (hx tobacco abuse); CHF (reduced but recovered, most recent 55%), CAD (hx MI, CVA x2), CKD  (single kidney) . Most recent lab work showed increased Scr (baseline 1-1.1; eGFR 41) and decreased Hgb (10.6, previously ~12); iron/TSAT low; Patient reports increased SOB, fatigue, dizziness. F/u with PCP next week. Confirms he d/c meloxicam, reviewed medications (including OTC) today  o COPD: Trelegy daily, albuterol HFA or Duonebs TID-QID; oxygen 24 hours. Waiting on new portable machine w/ greater battery life, CMA working on this  o Anemia: has not started an iron supplement as per Dr. Lupita Dawn recommendation (notes he was unaware). Denies dark stools, blood in toilet, or on toilet paper. Reports he mailed back stool test about 2 weeks ago.  o CHF/CAD: last f/u with Dr. Rockey Situ 08/14/2018, overdue for f/u. Carvedilol 12.5 mg BID; losartan 100/25 mg daily, amlodipine 5 mg daily. Reports home SBP 120-130s o ASCVD risk reduction: ASA 81 mg daily, simvastatin 20 mg daily; last LDL >70; previously saw Dr. Lucky Cowboy  o Chronic pain: oxycodone 15 mg Q6H prn. Reports that he "does not feel like this has helped". Denies hx gabapentin/Lyrica.  o Urinary symptoms: notes he has urinary urgency/accidents often, but has had to miss f/u with Urology a few times. F/u scheduled in 09/2020.   Pharmacist Clinical Goal(s):  Marland Kitchen Over the next 90 days, patient will work with PharmD and provider towards optimized medication management  Interventions: . Comprehensive medication review performed, medication list updated in electronic medical record.  . Since meloxicam d/c, no medications, including OTC, that could be impacting renal function. Encouraged to always ask provider/pharmacist before starting OTC supplements . Reviewed recommendation to start daily iron - ferrous sulfate 65 mg daily w/ food. Patient unsure if he will make it by the store before his appointment w/ Dr. Derrel Nip next week, but will try. Counseled to take w/ food to avoid stomach upset and that iron supplementation can cause darker stools. He verbalizes  understanding . Reviewed chronic pain. He notes he saw someone "years ago" that he did not like, but would be interested in seeing someone new if Dr. Derrel Nip recommends it. Consider options like low dose gabapentin/pregabalin or duloxetine for chronic pain in augmentation w/ opioid therapy. Encouraged him to discuss w/ Dr. Derrel Nip at upcoming appointment.  . Encouraged to contact Dr. Maryclare Labrador office to schedule sooner follow up, and make an effort to attend, if symptoms become more bothersome.   Patient Self Care Activities:  . Patient will take medications as prescribed  Please see past updates related to this goal by clicking on the "Past Updates" button in the selected goal          Plan:  - Scheduled f/u call 05/27/20  Catie Darnelle Maffucci, PharmD, BCACP, [place] Pharmacist [place] [phone]

## 2020-03-25 NOTE — Patient Instructions (Signed)
Visit Information  Goals Addressed            This Visit's Progress     Patient Stated   . "My breathing is bad" (pt-stated)       CARE PLAN ENTRY (see longtitudinal plan of care for additional care plan information)  Current Barriers:  . Polypharmacy; complex patient with multiple comorbidities including COPD (hx tobacco abuse); CHF (reduced but recovered, most recent 55%), CAD (hx MI, CVA x2), CKD (single kidney) . Most recent lab work showed increased Scr (baseline 1-1.1; eGFR 41) and decreased Hgb (10.6, previously ~12); iron/TSAT low; Patient reports increased SOB, fatigue, dizziness. F/u with PCP next week. Confirms he d/c meloxicam, reviewed medications (including OTC) today  o COPD: Trelegy daily, albuterol HFA or Duonebs TID-QID; oxygen 24 hours. Waiting on new portable machine w/ greater battery life, CMA working on this  o Anemia: has not started an iron supplement as per Dr. Lupita Dawn recommendation (notes he was unaware). Denies dark stools, blood in toilet, or on toilet paper. Reports he mailed back stool test about 2 weeks ago.  o CHF/CAD: last f/u with Dr. Rockey Situ 08/14/2018, overdue for f/u. Carvedilol 12.5 mg BID; losartan 100/25 mg daily, amlodipine 5 mg daily. Reports home SBP 120-130s o ASCVD risk reduction: ASA 81 mg daily, simvastatin 20 mg daily; last LDL >70; previously saw Dr. Lucky Cowboy  o Chronic pain: oxycodone 15 mg Q6H prn. Reports that he "does not feel like this has helped". Denies hx gabapentin/Lyrica.  o Urinary symptoms: notes he has urinary urgency/accidents often, but has had to miss f/u with Urology a few times. F/u scheduled in 09/2020.   Pharmacist Clinical Goal(s):  Marland Kitchen Over the next 90 days, patient will work with PharmD and provider towards optimized medication management  Interventions: . Comprehensive medication review performed, medication list updated in electronic medical record.  . Since meloxicam d/c, no medications, including OTC, that could be  impacting renal function. Encouraged to always ask provider/pharmacist before starting OTC supplements . Reviewed recommendation to start daily iron - ferrous sulfate 65 mg daily w/ food. Patient unsure if he will make it by the store before his appointment w/ Dr. Derrel Nip next week, but will try. Counseled to take w/ food to avoid stomach upset and that iron supplementation can cause darker stools. He verbalizes understanding . Reviewed chronic pain. He notes he saw someone "years ago" that he did not like, but would be interested in seeing someone new if Dr. Derrel Nip recommends it. Consider options like low dose gabapentin/pregabalin or duloxetine for chronic pain in augmentation w/ opioid therapy. Encouraged him to discuss w/ Dr. Derrel Nip at upcoming appointment.  . Encouraged to contact Dr. Maryclare Labrador office to schedule sooner follow up, and make an effort to attend, if symptoms become more bothersome.   Patient Self Care Activities:  . Patient will take medications as prescribed  Please see past updates related to this goal by clicking on the "Past Updates" button in the selected goal         Patient verbalizes understanding of instructions provided today.   Plan:  - Scheduled f/u call 05/27/20  Catie Darnelle Maffucci, PharmD, BCACP, CPP Clinical Pharmacist [place] [phone]

## 2020-03-30 ENCOUNTER — Ambulatory Visit (INDEPENDENT_AMBULATORY_CARE_PROVIDER_SITE_OTHER): Payer: Medicare HMO | Admitting: Internal Medicine

## 2020-03-30 ENCOUNTER — Encounter: Payer: Self-pay | Admitting: Internal Medicine

## 2020-03-30 VITALS — BP 140/85 | Resp 92 | Ht 72.0 in | Wt 290.0 lb

## 2020-03-30 DIAGNOSIS — M19212 Secondary osteoarthritis, left shoulder: Secondary | ICD-10-CM | POA: Diagnosis not present

## 2020-03-30 DIAGNOSIS — R944 Abnormal results of kidney function studies: Secondary | ICD-10-CM | POA: Diagnosis not present

## 2020-03-30 DIAGNOSIS — D649 Anemia, unspecified: Secondary | ICD-10-CM | POA: Diagnosis not present

## 2020-03-30 DIAGNOSIS — D510 Vitamin B12 deficiency anemia due to intrinsic factor deficiency: Secondary | ICD-10-CM

## 2020-03-30 DIAGNOSIS — N183 Chronic kidney disease, stage 3 unspecified: Secondary | ICD-10-CM | POA: Insufficient documentation

## 2020-03-30 DIAGNOSIS — D509 Iron deficiency anemia, unspecified: Secondary | ICD-10-CM | POA: Diagnosis not present

## 2020-03-30 DIAGNOSIS — F1011 Alcohol abuse, in remission: Secondary | ICD-10-CM

## 2020-03-30 DIAGNOSIS — E1122 Type 2 diabetes mellitus with diabetic chronic kidney disease: Secondary | ICD-10-CM | POA: Insufficient documentation

## 2020-03-30 DIAGNOSIS — M4722 Other spondylosis with radiculopathy, cervical region: Secondary | ICD-10-CM | POA: Diagnosis not present

## 2020-03-30 NOTE — Assessment & Plan Note (Signed)
meloxicam and furosemide suspended  .  Repeat due.  History of nephrectomy.   Lab Results  Component Value Date   CREATININE 1.63 (H) 03/15/2020

## 2020-03-30 NOTE — Assessment & Plan Note (Signed)
He reports that he drinks not more than one shot of bourbon every 3-4 weeks

## 2020-03-30 NOTE — Assessment & Plan Note (Signed)
He has bilateral hand numbness aggravated by arthritis.Victor Daniels  He refuses any further workup given the perceived failure of his anterior cervical fusion in 2006 continue oxycodone 15 mg every 6 hours prn  Refills for 3 months as given at last visit.  he has not had any ER visits  And has not requested any early refills.   Refill history was confirmed via [place] Controlled Substance database by me today during her visit and there have been no prescriptions of controlled substances filled from any providers other than me. Victor Daniels

## 2020-03-30 NOTE — Assessment & Plan Note (Signed)
Iron deficiency suggested by labs . FOBT is pending.  Advised to stop asa , meloxicam was stopped March 23,  And repeat iron/CBC.
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
## 2020-03-30 NOTE — Assessment & Plan Note (Signed)
He has constant pain despite prior surgery and is not willing to return to Orthopedics or try PT  .  Given his new onset IDA I have had to stop the meloxicam .  Continue oxycodone  Max dose 15 mg every 6 hours

## 2020-03-30 NOTE — Assessment & Plan Note (Signed)
Continue sublingual supplementation per patient preference.  Repeat level needed given drop in hgb   Lab Results  Component Value Date   VITAMINB12 1,104 (H) 12/12/2017

## 2020-03-31 ENCOUNTER — Telehealth: Payer: Self-pay | Admitting: Internal Medicine

## 2020-03-31 NOTE — Telephone Encounter (Signed)
Called pt back and he stated that he spoke with [place] today and they stated that he has been on oxygen since 2011. I let pt know that I would reach back out to [facility] either this afternoon or tomorrow morning to see what all we needed to do now and that I would keep in touch with him to let him know what all is going on with it. Pt gave a verbal understanding.

## 2020-03-31 NOTE — Telephone Encounter (Signed)
Pt called back about his oxygen

## 2020-03-31 NOTE — Telephone Encounter (Signed)
Pt called and wanted a call back from [place] about his oxygen

## 2020-04-02 NOTE — Telephone Encounter (Signed)
Form for [facility] has been filled out and placed in quick sign folder to be signed when you return from vacation.

## 2020-04-06 ENCOUNTER — Other Ambulatory Visit: Payer: Medicare HMO

## 2020-04-06 DIAGNOSIS — J449 Chronic obstructive pulmonary disease, unspecified: Secondary | ICD-10-CM | POA: Diagnosis not present

## 2020-04-07 ENCOUNTER — Telehealth: Payer: Self-pay | Admitting: Internal Medicine

## 2020-04-07 NOTE — Telephone Encounter (Signed)
err

## 2020-04-07 NOTE — Telephone Encounter (Signed)
Beth with [facility] called and states that pt has not had oxygen testing since 2017 and it needs to be more current. Please advise.

## 2020-04-09 ENCOUNTER — Other Ambulatory Visit: Payer: Medicare HMO

## 2020-04-12 NOTE — Telephone Encounter (Signed)
Spoke with pt and scheduled him for a nurse visit to have an updated ambulating walk test and lab work. Pt is aware of appt date and time.

## 2020-04-15 ENCOUNTER — Ambulatory Visit (INDEPENDENT_AMBULATORY_CARE_PROVIDER_SITE_OTHER): Payer: Medicare HMO

## 2020-04-15 ENCOUNTER — Other Ambulatory Visit: Payer: Self-pay | Admitting: Internal Medicine

## 2020-04-15 ENCOUNTER — Other Ambulatory Visit: Payer: Self-pay

## 2020-04-15 DIAGNOSIS — J441 Chronic obstructive pulmonary disease with (acute) exacerbation: Secondary | ICD-10-CM

## 2020-04-15 DIAGNOSIS — E1159 Type 2 diabetes mellitus with other circulatory complications: Secondary | ICD-10-CM | POA: Diagnosis not present

## 2020-04-15 DIAGNOSIS — D509 Iron deficiency anemia, unspecified: Secondary | ICD-10-CM | POA: Diagnosis not present

## 2020-04-15 DIAGNOSIS — D510 Vitamin B12 deficiency anemia due to intrinsic factor deficiency: Secondary | ICD-10-CM | POA: Diagnosis not present

## 2020-04-15 DIAGNOSIS — R350 Frequency of micturition: Secondary | ICD-10-CM

## 2020-04-15 DIAGNOSIS — R944 Abnormal results of kidney function studies: Secondary | ICD-10-CM | POA: Diagnosis not present

## 2020-04-15 LAB — CBC WITH DIFFERENTIAL/PLATELET
Basophils Absolute: 0.1 10*3/uL (ref 0.0–0.1)
Basophils Relative: 0.8 % (ref 0.0–3.0)
Eosinophils Absolute: 0.3 10*3/uL (ref 0.0–0.7)
Eosinophils Relative: 3.5 % (ref 0.0–5.0)
HCT: 31.9 % — ABNORMAL LOW (ref 39.0–52.0)
Hemoglobin: 10.2 g/dL — ABNORMAL LOW (ref 13.0–17.0)
Lymphocytes Relative: 15.6 % (ref 12.0–46.0)
Lymphs Abs: 1.5 10*3/uL (ref 0.7–4.0)
MCHC: 32 g/dL (ref 30.0–36.0)
MCV: 85.8 fl (ref 78.0–100.0)
Monocytes Absolute: 0.9 10*3/uL (ref 0.1–1.0)
Monocytes Relative: 9.1 % (ref 3.0–12.0)
Neutro Abs: 6.8 10*3/uL (ref 1.4–7.7)
Neutrophils Relative %: 71 % (ref 43.0–77.0)
Platelets: 241 10*3/uL (ref 150.0–400.0)
RBC: 3.72 Mil/uL — ABNORMAL LOW (ref 4.22–5.81)
RDW: 17.3 % — ABNORMAL HIGH (ref 11.5–15.5)
WBC: 9.6 10*3/uL (ref 4.0–10.5)

## 2020-04-15 LAB — RENAL FUNCTION PANEL
Albumin: 3.3 g/dL — ABNORMAL LOW (ref 3.5–5.2)
BUN: 34 mg/dL — ABNORMAL HIGH (ref 6–23)
CO2: 38 mEq/L — ABNORMAL HIGH (ref 19–32)
Calcium: 9.1 mg/dL (ref 8.4–10.5)
Chloride: 97 mEq/L (ref 96–112)
Creatinine, Ser: 1.67 mg/dL — ABNORMAL HIGH (ref 0.40–1.50)
GFR: 40.47 mL/min — ABNORMAL LOW (ref >60.00)
Glucose, Bld: 121 mg/dL — ABNORMAL HIGH (ref 70–99)
Phosphorus: 3 mg/dL (ref 2.3–4.6)
Potassium: 4.1 mEq/L (ref 3.5–5.1)
Sodium: 142 mEq/L (ref 135–145)

## 2020-04-15 LAB — VITAMIN B12: Vitamin B-12: 1406 pg/mL — ABNORMAL HIGH (ref 211–911)

## 2020-04-15 MED ORDER — METHYLPREDNISOLONE ACETATE 80 MG/ML IJ SUSP
80.0000 mg | Freq: Once | INTRAMUSCULAR | Status: AC
  Administered 2020-04-15: 80 mg via INTRAMUSCULAR

## 2020-04-15 MED ORDER — PREDNISONE 10 MG PO TABS: ORAL_TABLET | ORAL | 0 refills | Status: DC

## 2020-04-15 NOTE — Progress Notes (Signed)
SATURATION QUALIFICATIONS: (This note is used to comply with regulatory documentation for home oxygen)  Patient Saturations on Room Air at Rest = 64%  Patient Saturations on 2 Liters of oxygen while Ambulating = 81%  Patient Saturations on 3 liters while ambulating 64%   Patient Saturation on 5 liters 93%   Patient was informed that he needed to go to the Hospital by ambulance. Patient refused t go to the hospital. Patient was informed that he could go home and pass out and have a heart attack with his oxygen level where it is and he still refused.   Provider stated taht he could not be forced bu the needed to go home and explain to his wife about his oxygen and if he is having any chest pains, or blurred vision he needed to seek emergency help immediately and the patient understood and agreed.   Patient was given 80 mg of MethykPREDnisone while here at the clinic.    Please briefly explain why patient needs home oxygen:

## 2020-04-19 LAB — RETICULOCYTES
ABS Retic: 42570 cells/uL (ref 25000–9000)
Retic Ct Pct: 1.1 %

## 2020-04-19 LAB — IRON,TIBC AND FERRITIN PANEL
%SAT: 19 % (calc) — ABNORMAL LOW (ref 20–48)
Ferritin: 180 ng/mL (ref 24–380)
Iron: 42 ug/dL — ABNORMAL LOW (ref 50–180)
TIBC: 225 mcg/dL (calc) — ABNORMAL LOW (ref 250–425)

## 2020-04-19 LAB — ERYTHROPOIETIN: Erythropoietin: 44.9 m[IU]/mL — ABNORMAL HIGH (ref 2.6–18.5)

## 2020-04-20 ENCOUNTER — Other Ambulatory Visit: Payer: Self-pay | Admitting: Internal Medicine

## 2020-04-20 ENCOUNTER — Telehealth: Payer: Self-pay | Admitting: Internal Medicine

## 2020-04-20 DIAGNOSIS — D509 Iron deficiency anemia, unspecified: Secondary | ICD-10-CM

## 2020-04-20 NOTE — Telephone Encounter (Signed)
Pt called about his nurse visit on 04/15/20 to see if he qualified for home oxygen

## 2020-04-22 NOTE — Telephone Encounter (Signed)
Spoke with pt and he stated that he went ahead an ordered an [employer]. Told pt that we have received form and that Dr. Darrick Huntsman will get it filled out and faxed today. Pt gave a verbal understanding.

## 2020-04-26 ENCOUNTER — Telehealth: Payer: Self-pay

## 2020-04-26 NOTE — Telephone Encounter (Signed)
Pt called back returning your call °

## 2020-04-26 NOTE — Telephone Encounter (Signed)
LMTCB in regards to his lab results.

## 2020-04-29 ENCOUNTER — Ambulatory Visit: Payer: Self-pay | Admitting: Pharmacist

## 2020-04-29 NOTE — Telephone Encounter (Signed)
See result note message 

## 2020-04-29 NOTE — Chronic Care Management (AMB) (Signed)
  Chronic Care Management   Note  04/29/2020 Name: AMEAR STROJNY MRN: [id number] DOB: August 16, 1946  KASCH BORQUEZ is a 74 y.o. year old male who is a primary care patient of Tullo, Mar Daring, MD. The CCM team was consulted for assistance with chronic disease management and care coordination needs.    Received a voicemail from patient asking if there were refills on his oxycodone sent to the pharmacy. Attempted to call back, but went to voicemail. Left voicemail noting that the medication in question had adequate refills at the pharmacy, and again reminded him that calling me directly for refills is inappropriate, he should call his pharmacy directly about that moving forward.   Follow up plan: - Will outreach as previously scheduled  Catie Feliz Beam, PharmD, [place], CPP Clinical Pharmacist [facility] [employer] [phone]

## 2020-05-03 ENCOUNTER — Telehealth: Payer: Self-pay

## 2020-05-03 ENCOUNTER — Other Ambulatory Visit: Payer: Self-pay

## 2020-05-03 NOTE — Telephone Encounter (Signed)
error 

## 2020-05-05 ENCOUNTER — Encounter: Payer: Self-pay | Admitting: Internal Medicine

## 2020-05-05 ENCOUNTER — Telehealth (INDEPENDENT_AMBULATORY_CARE_PROVIDER_SITE_OTHER): Payer: Medicare HMO | Admitting: Internal Medicine

## 2020-05-05 VITALS — BP 135/75 | Ht 72.01 in | Wt 300.0 lb

## 2020-05-05 DIAGNOSIS — D649 Anemia, unspecified: Secondary | ICD-10-CM | POA: Diagnosis not present

## 2020-05-05 DIAGNOSIS — R944 Abnormal results of kidney function studies: Secondary | ICD-10-CM

## 2020-05-05 DIAGNOSIS — E1159 Type 2 diabetes mellitus with other circulatory complications: Secondary | ICD-10-CM
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
## 2020-05-06 DIAGNOSIS — J449 Chronic obstructive pulmonary disease, unspecified: Secondary | ICD-10-CM | POA: Diagnosis not present

## 2020-05-06 NOTE — Assessment & Plan Note (Signed)
Iron deficiency suggested by labs . FOBT is still  pending.  Advised to stop asa , meloxicam was stopped March 23,  And iron supplements started repeat iron/CBC ,   Lab Results  Component Value Date   WBC 9.6 04/15/2020   HGB 10.2 (L) 04/15/2020   HCT 31.9 (L) 04/15/2020   MCV 85.8 04/15/2020   PLT 241.0 04/15/2020

## 2020-05-06 NOTE — Assessment & Plan Note (Signed)
Remains  well-controlled on diet alone . Patient is up-to-date on eye exams and now seeing podiatry for diabetic foot ulcer noted at last visit.  Patient has no microalbuminuria. Patient is tolerating statin therapy for CAD risk reduction and on ACE/ARB for renal protection and hypertension      Continue asa and statin.  He has no proteinuria and has annual eye exams. He has deferred the final Pneumovax vaccine.   Lab Results  Component Value Date   HGBA1C 6.6 (H) 03/15/2020   Lab Results  Component Value Date   MICROALBUR <0.7 07/22/2019

## 2020-05-06 NOTE — Assessment & Plan Note (Addendum)
meloxicam and furosemide suspended  .  Repeat assessment due,  adding SPEP and urine IFE   History of nephrectomy.   Lab Results  Component Value Date   CREATININE 1.67 (H) 04/15/2020

## 2020-05-07 ENCOUNTER — Telehealth: Payer: Self-pay | Admitting: Pharmacist

## 2020-05-07 MED ORDER — PREDNISONE 10 MG PO TABS: ORAL_TABLET | ORAL | 0 refills | Status: DC

## 2020-05-07 NOTE — Telephone Encounter (Signed)
Patient called my phone last night after hours, left voicemail requesting refill on his prednisone "feels like my lungs are starting to get congested again".   Passing along to PCP and CMA. Please remind patient, as I have numerous times, that he should not call me for medication refills such as this, he should call his pharmacy or call clinic directly.

## 2020-05-07 NOTE — Telephone Encounter (Signed)
Spoke with Dr. Darrick Huntsman verbally and she gave a verbal order to refill the prednisone taper. Rx has been sent in and pt is aware that is was sent to [facility] on [address].

## 2020-05-07 NOTE — Telephone Encounter (Signed)
  I have reviewed the above information and agree with above.   Teresa Tullo, MD 

## 2020-05-15 ENCOUNTER — Other Ambulatory Visit: Payer: Self-pay | Admitting: Internal Medicine

## 2020-05-17 ENCOUNTER — Ambulatory Visit: Payer: Self-pay | Admitting: Pharmacist

## 2020-05-17 NOTE — Chronic Care Management (AMB) (Signed)
  Chronic Care Management   Note  05/17/2020 Name: Victor Daniels MRN: [id number] DOB: 11-17-1946  Victor Daniels is a 74 y.o. year old male who is a primary care patient of Darrick Huntsman, Mar Daring, MD. The CCM team was consulted for assistance with chronic disease management and care coordination needs.    Patient called today to inquire about Trelegy 200 mcg inhaler (currently on 100 mcg dose). Educated that this dose is for asthma, not COPD, increasing dose of ICS is unlikely to provide additional benefit.   Will discuss further at upcoming appointment next week.   Catie Feliz Beam, PharmD, [place], CPP Clinical Pharmacist [facility] [employer] [phone]

## 2020-05-19 ENCOUNTER — Other Ambulatory Visit: Payer: Medicare HMO

## 2020-05-19 ENCOUNTER — Other Ambulatory Visit (INDEPENDENT_AMBULATORY_CARE_PROVIDER_SITE_OTHER): Payer: Medicare HMO

## 2020-05-19 ENCOUNTER — Other Ambulatory Visit: Payer: Self-pay

## 2020-05-19 DIAGNOSIS — D649 Anemia, unspecified: Secondary | ICD-10-CM

## 2020-05-19 DIAGNOSIS — R944 Abnormal results of kidney function studies: Secondary | ICD-10-CM

## 2020-05-20 ENCOUNTER — Ambulatory Visit: Payer: Medicare HMO | Admitting: Internal Medicine

## 2020-05-20 LAB — CBC WITH DIFFERENTIAL/PLATELET
Basophils Absolute: 0.1 10*3/uL (ref 0.0–0.1)
Basophils Relative: 1.3 % (ref 0.0–3.0)
Eosinophils Absolute: 0.2 10*3/uL (ref 0.0–0.7)
Eosinophils Relative: 2 % (ref 0.0–5.0)
HCT: 32.1 % — ABNORMAL LOW (ref 39.0–52.0)
Hemoglobin: 10.2 g/dL — ABNORMAL LOW (ref 13.0–17.0)
Lymphocytes Relative: 17.4 % (ref 12.0–46.0)
Lymphs Abs: 1.4 10*3/uL (ref 0.7–4.0)
MCHC: 31.7 g/dL (ref 30.0–36.0)
MCV: 85.2 fl (ref 78.0–100.0)
Monocytes Absolute: 0.9 10*3/uL (ref 0.1–1.0)
Monocytes Relative: 11.3 % (ref 3.0–12.0)
Neutro Abs: 5.3 10*3/uL (ref 1.4–7.7)
Neutrophils Relative %: 68 % (ref 43.0–77.0)
Platelets: 226 10*3/uL (ref 150.0–400.0)
RBC: 3.77 Mil/uL — ABNORMAL LOW (ref 4.22–5.81)
RDW: 18.3 % — ABNORMAL HIGH (ref 11.5–15.5)
WBC: 7.8 10*3/uL (ref 4.0–10.5)

## 2020-05-20 LAB — RENAL FUNCTION PANEL
Albumin: 3.2 g/dL — ABNORMAL LOW (ref 3.5–5.2)
BUN: 33 mg/dL — ABNORMAL HIGH (ref 6–23)
CO2: 40 mEq/L — ABNORMAL HIGH (ref 19–32)
Calcium: 9 mg/dL (ref 8.4–10.5)
Chloride: 98 mEq/L (ref 96–112)
Creatinine, Ser: 1.71 mg/dL — ABNORMAL HIGH (ref 0.40–1.50)
GFR: 39.37 mL/min — ABNORMAL LOW (ref >60.00)
Glucose, Bld: 83 mg/dL (ref 70–99)
Phosphorus: 3.4 mg/dL (ref 2.3–4.6)
Potassium: 3.5 mEq/L (ref 3.5–5.1)
Sodium: 142 mEq/L (ref 135–145)

## 2020-05-20 LAB — IFE AND PE, RANDOM URINE
% BETA, Urine: 35.3 %
ALBUMIN, U: 32 %
ALPHA 1 URINE: 5.2 %
ALPHA-2-GLOBULIN, U: 11.3 %
GAMMA GLOBULIN URINE: 16.1 %
Protein, Ur: 20.3 mg/dL

## 2020-05-21 ENCOUNTER — Encounter: Payer: Self-pay | Admitting: Internal Medicine

## 2020-05-21 ENCOUNTER — Ambulatory Visit (INDEPENDENT_AMBULATORY_CARE_PROVIDER_SITE_OTHER): Payer: Medicare HMO

## 2020-05-21 ENCOUNTER — Ambulatory Visit (INDEPENDENT_AMBULATORY_CARE_PROVIDER_SITE_OTHER): Payer: Medicare HMO | Admitting: Internal Medicine

## 2020-05-21 ENCOUNTER — Other Ambulatory Visit: Payer: Self-pay

## 2020-05-21 VITALS — BP 124/70 | HR 59 | Temp 96.4°F | Resp 17 | Ht 72.0 in | Wt 278.0 lb

## 2020-05-21 DIAGNOSIS — J431 Panlobular emphysema: Secondary | ICD-10-CM

## 2020-05-21 DIAGNOSIS — J209 Acute bronchitis, unspecified: Secondary | ICD-10-CM

## 2020-05-21 DIAGNOSIS — E782 Mixed hyperlipidemia: Secondary | ICD-10-CM | POA: Diagnosis not present

## 2020-05-21 DIAGNOSIS — R0602 Shortness of breath: Secondary | ICD-10-CM | POA: Diagnosis not present

## 2020-05-21 DIAGNOSIS — D649 Anemia, unspecified: Secondary | ICD-10-CM | POA: Diagnosis not present

## 2020-05-21 DIAGNOSIS — N401 Enlarged prostate with lower urinary tract symptoms: Secondary | ICD-10-CM

## 2020-05-21 DIAGNOSIS — J440 Chronic obstructive pulmonary disease with acute lower respiratory infection: Secondary | ICD-10-CM

## 2020-05-21 DIAGNOSIS — N1831 Chronic kidney disease, stage 3a: Secondary | ICD-10-CM | POA: Diagnosis not present

## 2020-05-21 DIAGNOSIS — N1832 Chronic kidney disease, stage 3b: Secondary | ICD-10-CM

## 2020-05-21 DIAGNOSIS — M4722 Other spondylosis with radiculopathy, cervical region: Secondary | ICD-10-CM

## 2020-05-21 DIAGNOSIS — D508 Other iron deficiency anemias: Secondary | ICD-10-CM

## 2020-05-21 DIAGNOSIS — R351 Nocturia: Secondary | ICD-10-CM

## 2020-05-21 DIAGNOSIS — J189 Pneumonia, unspecified organism: Secondary | ICD-10-CM

## 2020-05-21 LAB — PROTEIN ELECTROPHORESIS, SERUM
Albumin ELP: 2.7 g/dL — ABNORMAL LOW (ref 3.8–4.8)
Alpha 1: 0.4 g/dL — ABNORMAL HIGH (ref 0.2–0.3)
Alpha 2: 0.9 g/dL (ref 0.5–0.9)
Beta 2: 0.5 g/dL (ref 0.2–0.5)
Beta Globulin: 0.4 g/dL (ref 0.4–0.6)
Gamma Globulin: 1.4 g/dL (ref 0.8–1.7)
Total Protein: 6.4 g/dL (ref 6.1–8.1)

## 2020-05-21 MED ORDER — SIMVASTATIN 20 MG PO TABS: 20.0000 mg | ORAL_TABLET | Freq: Every day | ORAL | 3 refills | Status: DC

## 2020-05-21 MED ORDER — FUROSEMIDE 20 MG PO TABS
20.0000 mg | ORAL_TABLET | ORAL | 3 refills | Status: DC
Start: 2020-05-21 — End: 2020-06-25

## 2020-05-21 MED ORDER — AMLODIPINE BESYLATE 5 MG PO TABS: 5.0000 mg | ORAL_TABLET | Freq: Every day | ORAL | 1 refills | Status: DC

## 2020-05-21 MED ORDER — TANDEM 162-115.2 MG PO CAPS: 1.0000 | ORAL_CAPSULE | Freq: Every day | ORAL | 2 refills | Status: DC

## 2020-05-21 NOTE — Assessment & Plan Note (Signed)
IRON IS BORDERLINE LOW and FOBT WAS DONE BUT NOT RECEIVED (per patient).  EPO level was normal .  Iron supplementation advised and referral to Nephrology for CKD>

## 2020-05-21 NOTE — Assessment & Plan Note (Signed)
Taking saw palmetto as of last month. Advised to see Urology for mutliple symptoms including spray of urine

## 2020-05-21 NOTE — Patient Instructions (Addendum)
I suggest that you call DR Lonna Cobb to set up an appointment to discuss your urinating problems  I also recommend you see Dr [name] again since your kidney disease is causing your kidney disease    I am starting you every other day fluid pill (furosemide) to take for your swollen legs   continue to use compression stockings  Limit your salt intake to 2000 mg daily  IF YOU GAIN 2 LBS OVERNIGHT on your scales,  Take an extra fluid pill  WALK DAILY.

## 2020-05-21 NOTE — Progress Notes (Signed)
Subjective:  Patient ID: Victor Daniels, male    DOB: 1946/02/19  Age: 74 y.o. MRN: [id number]  CC: The primary encounter diagnosis was Acute bronchitis with COPD (HCC). Diagnoses of Mixed hyperlipidemia, Anemia, unspecified type, Nocturia, Benign localized prostatic hyperplasia with lower urinary tract symptoms (LUTS), Cervical radiculopathy due to degenerative joint disease of spine, Panlobular emphysema (HCC), Other iron deficiency anemia, Chronic kidney disease, stage 3a, Stage 3b chronic kidney disease, and Pneumonia of right lower lobe due to infectious organism were also pertinent to this visit.  HPI Victor Daniels presents for follow up on chronic issues.   This visit occurred during the SARS-CoV-2 public health emergency.  Safety protocols were in place, including screening questions prior to the visit, additional usage of staff PPE, and extensive cleaning of exam room while observing appropriate contact time as indicated for disinfecting solutions.     Patient is a 74 yr old male with chronic hypoxic respiratory failure secondary to COPD .  He has had a recent episode of increased dyspnea without chest pain or purulent sputum,  And a steroid taper was tarted on may 14 .  He feels better but "Still having issues breathing. " sputum is still scant and white.  No fevers or body aches    Using Inogen at 4 L.min sats 92% .  Carrying it in left hand  Has lost weight.  Not eating well,  Eats junk food per wife.   Urinary frequency and incontinence . Urine sprays instead of a stream. Has a urologist but has not made an appointment although he has seen Stoioff in the last year.   Anemia of CKD  Saw Singh in the past  But not in the last 2 years.    Needs referral  To return to [facility].  Iron stores are borderline low.    Hypertension: patient checks blood pressure twice weekly at home.  Readings have been for the most part < 140/80 at rest . Patient is following a reduced salt diet  most days and is taking medications as prescribed  Outpatient Medications Prior to Visit  Medication Sig Dispense Refill  . carvedilol (COREG) 12.5 MG tablet Take 1 tablet (12.5 mg total) by mouth 2 (two) times daily with a meal. 180 tablet 1  . Fluticasone-Umeclidin-Vilant (TRELEGY ELLIPTA) 100-62.5-25 MCG/INH AEPB Inhale 1 puff into the lungs daily. 3 each 1  . ipratropium (ATROVENT) 0.03 % nasal spray Place 2 sprays into both nostrils 2 (two) times daily. 60 mL 2  . ipratropium-albuterol (DUONEB) 0.5-2.5 (3) MG/3ML SOLN Take 3 mLs by nebulization every 6 (six) hours as needed (wheezing). DX: COPD DX Code: J44.9 360 mL 5  . losartan-hydrochlorothiazide (HYZAAR) 100-25 MG tablet Take 1 tablet by mouth daily. 90 tablet 1  . methocarbamol (ROBAXIN) 500 MG tablet Take 1 tablet (500 mg total) by mouth every 8 (eight) hours as needed for muscle spasms. 90 tablet 1  . Multiple Vitamin (MULTIVITAMIN) capsule Take 1 capsule by mouth daily.    . nitroGLYCERIN (NITROSTAT) 0.4 MG SL tablet Place 1 tablet (0.4 mg total) under the tongue every 5 (five) minutes as needed for chest pain. Maximum dose 3 tablets 50 tablet 3  . oxyCODONE (ROXICODONE) 15 MG immediate release tablet Take 1 tablet (15 mg total) by mouth every 6 (six) hours as needed for pain. 120 tablet 0  . oxyCODONE (ROXICODONE) 15 MG immediate release tablet Take 1 tablet (15 mg total) by mouth every 6 (six) hours as needed  for pain. 120 tablet 0  . oxyCODONE (ROXICODONE) 15 MG immediate release tablet Take 1 tablet (15 mg total) by mouth every 6 (six) hours as needed for pain. 120 tablet 0  . OXYGEN Inhale 2 L into the lungs 3 (three) times daily as needed (shortness of breath).     . VENTOLIN HFA 108 (90 Base) MCG/ACT inhaler INHALE 2 PUFFS EVERY 6 HOURS AS NEEDED FOR WHEEZING OR SHORTNESS OF BREATH 18 g 2  . vitamin B-12 (CYANOCOBALAMIN) 1000 MCG tablet Take 1,000 mcg by mouth daily.    Marland Kitchen amLODipine (NORVASC) 5 MG tablet TAKE 1 TABLET EVERY DAY  90 tablet 1  . simvastatin (ZOCOR) 20 MG tablet Take 1 tablet (20 mg total) by mouth at bedtime. 90 tablet 3  . predniSONE (DELTASONE) 10 MG tablet 6 tablets daily for 3 days, then reduce by 1 tablet daily until gone (Patient not taking: Reported on 05/21/2020) 33 tablet 0   No facility-administered medications prior to visit.    Review of Systems;  Patient denies headache, fevers, malaise, unintentional weight loss, skin rash, eye pain, sinus congestion and sinus pain, sore throat, dysphagia,  hemoptysis , cough, dyspnea, wheezing, chest pain, palpitations, orthopnea, edema, abdominal pain, nausea, melena, diarrhea, constipation, flank pain, dysuria, hematuria, urinary  Frequency, nocturia, numbness, tingling, seizures,  Focal weakness, Loss of consciousness,  Tremor, insomnia, depression, anxiety, and suicidal ideation.      Objective:  BP 124/70 (BP Location: Left Arm, Patient Position: Sitting, Cuff Size: Normal)   Pulse (!) 59   Temp (!) 96.4 F (35.8 C) (Temporal)   Resp 17   Ht 6' (1.829 m)   Wt 278 lb (126.1 kg)   SpO2 91% Comment: 4L O2  BMI 37.70 kg/m   BP Readings from Last 3 Encounters:  05/21/20 124/70  05/05/20 135/75  03/30/20 140/85    Wt Readings from Last 3 Encounters:  05/21/20 278 lb (126.1 kg)  05/05/20 300 lb (136.1 kg)  03/30/20 290 lb (131.5 kg)    General appearance: alert, cooperative and appears stated age Ears: normal TM's and external ear canals both ears Throat: lips, mucosa, and tongue normal; teeth and gums normal Neck: no adenopathy, no carotid bruit, supple, symmetrical, trachea midline and thyroid not enlarged, symmetric, no tenderness/mass/nodules Back: symmetric, no curvature. ROM normal. No CVA tenderness. Lungs: clear to auscultation bilaterally without wheezing;  Decreased BS at right base  Heart: regular rate and rhythm, S1, S2 normal, no murmur, click, rub or gallop Abdomen: soft, non-tender; bowel sounds normal; no masses,  no  organomegaly Pulses: 2+ and symmetric Skin: Skin color, texture, turgor normal. No rashes or lesions Lymph nodes: Cervical, supraclavicular, and axillary nodes normal.  Lab Results  Component Value Date   HGBA1C 6.6 (H) 03/15/2020   HGBA1C 6.2 07/22/2019   HGBA1C 5.5 12/20/2018    Lab Results  Component Value Date   CREATININE 1.71 (H) 05/19/2020   CREATININE 1.67 (H) 04/15/2020   CREATININE 1.63 (H) 03/15/2020    Lab Results  Component Value Date   WBC 7.8 05/19/2020   HGB 10.2 (L) 05/19/2020   HCT 32.1 (L) 05/19/2020   PLT 226.0 05/19/2020   GLUCOSE 83 05/19/2020   CHOL 123 03/15/2020   TRIG 104.0 03/15/2020   HDL 38.80 (L) 03/15/2020   LDLDIRECT 72.0 12/01/2016   LDLCALC 63 03/15/2020   ALT 15 03/15/2020   AST 15 03/15/2020   NA 142 05/19/2020   K 3.5 05/19/2020   CL 98 05/19/2020  CREATININE 1.71 (H) 05/19/2020   BUN 33 (H) 05/19/2020   CO2 40 (H) 05/19/2020   TSH 1.52 03/15/2020   INR 0.9 04/02/2015   HGBA1C 6.6 (H) 03/15/2020   MICROALBUR <0.7 07/22/2019    No results found.  Assessment & Plan:   Problem List Items Addressed This Visit      Unprioritized   Anemia    IRON IS BORDERLINE LOW and FOBT WAS DONE BUT NOT RECEIVED (per patient).  EPO level was normal .  Iron supplementation advised and referral to Nephrology for CKD>      Relevant Medications   ferrous fumarate-iron polysaccharide complex (TANDEM) 162-115.2 MG CAPS capsule   Other Relevant Orders   Ambulatory referral to Nephrology   Benign localized prostatic hyperplasia with lower urinary tract symptoms (LUTS)    Now with altered urinary stream suggesting fibrosis causing spray.  Advised to follow up wit Dr Lonna Cobb      Cervical radiculopathy due to degenerative joint disease of spine    He has bilateral hand numbness aggravated by arthritis.Marland Kitchen  He refuses any further workup given the perceived failure of his anterior cervical fusion in 2006 .  He requires use of oxycodone 15 mg every  6 hours prn  And   he has not had any ER visits  And has not requested any early refills.   Refill history was confirmed via [place] Controlled Substance database by me today during visit .  Tthe rx dated march 22 was filled on May 6,  So he has 2 more  refills on file that havee not been used.   there have been no prescriptions of controlled substances filled from any providers other than me. .        CKD (chronic kidney disease) stage 3, GFR 30-59 ml/min    meloxicam and furosemide suspended  .  Repeat assessment is without change,   MM rule dout with  SPEP and urine IFE   History of nephrectomy.   Nephrology referral in process to prior provider D r [name].   Lab Results  Component Value Date   CREATININE 1.71 (H) 05/19/2020         COPD (chronic obstructive pulmonary disease) (HCC)    He has been referred to pulmonology but prefers to be seen by me most of the time.  Chest x ray suggests RLL pneumonia. .  Adding Levaquin .   He is oxygen dependent and currently on 4 L       Hyperlipidemia   Relevant Medications   amLODipine (NORVASC) 5 MG tablet   simvastatin (ZOCOR) 20 MG tablet   furosemide (LASIX) 20 MG tablet   Iron deficiency anemia    Iron deficiency and CKD etiologies  suggested by labs . FOBT is still  pending.  Advised to stop asa , meloxicam was stopped March 23,  And iron supplements recommended today.  Will need to see nephrology for monitoring need for Aranesp   Lab Results  Component Value Date   WBC 7.8 05/19/2020   HGB 10.2 (L) 05/19/2020   HCT 32.1 (L) 05/19/2020   MCV 85.2 05/19/2020   PLT 226.0 05/19/2020         Relevant Medications   ferrous fumarate-iron polysaccharide complex (TANDEM) 162-115.2 MG CAPS capsule   Other Relevant Orders   Ambulatory referral to Nephrology   Nocturia    Taking saw palmetto as of last month. Advised to see Urology for mutliple symptoms including spray of urine  Pneumonia    RLL infiltrate noted on chest x ray today,   due to persistent dyspnea and decreased BS RLL.  Starting levaquin,  renally dosed.  (500 mg day 1,  250 mg daily x 6 )       Relevant Medications   levofloxacin (LEVAQUIN) 250 MG tablet    Other Visit Diagnoses    Acute bronchitis with COPD (HCC)    -  Primary   Relevant Orders   DG Chest 2 View (Completed)   Chronic kidney disease, stage 3a       Relevant Orders   Ambulatory referral to Nephrology    A total of 40 minutes was spent with patient more than half of which was spent in counseling patient on the above mentioned issues , reviewing and explaining recent labs and imaging studies done, and coordination of care.  I have changed Tawni Millers. Chalk "Ed"'s amLODipine. I am also having him start on Tandem, furosemide, and levofloxacin. Additionally, I am having him maintain his nitroGLYCERIN, multivitamin, OXYGEN, ipratropium, ipratropium-albuterol, carvedilol, losartan-hydrochlorothiazide, methocarbamol, Trelegy Ellipta, Ventolin HFA, oxyCODONE, oxyCODONE, oxyCODONE, vitamin B-12, predniSONE, and simvastatin.  Meds ordered this encounter  Medications  . amLODipine (NORVASC) 5 MG tablet    Sig: Take 1 tablet (5 mg total) by mouth daily.    Dispense:  90 tablet    Refill:  1  . simvastatin (ZOCOR) 20 MG tablet    Sig: Take 1 tablet (20 mg total) by mouth at bedtime.    Dispense:  90 tablet    Refill:  3  . ferrous fumarate-iron polysaccharide complex (TANDEM) 162-115.2 MG CAPS capsule    Sig: Take 1 capsule by mouth daily with breakfast.    Dispense:  30 capsule    Refill:  2    Ok to make substitution  . furosemide (LASIX) 20 MG tablet    Sig: Take 1 tablet (20 mg total) by mouth every other day.    Dispense:  45 tablet    Refill:  3  . levofloxacin (LEVAQUIN) 250 MG tablet    Sig: 2 Tablets on DAy 1,  Then one tablet daily for 6 days    Dispense:  8 tablet    Refill:  0    Medications Discontinued During This Encounter  Medication Reason  . simvastatin (ZOCOR) 20  MG tablet Reorder  . amLODipine (NORVASC) 5 MG tablet Reorder    Follow-up: No follow-ups on file.   [name], MD

## 2020-05-23 MED ORDER — LEVOFLOXACIN 250 MG PO TABS: ORAL_TABLET | ORAL | 0 refills | Status: DC

## 2020-05-23 NOTE — Assessment & Plan Note (Signed)
Iron deficiency and CKD etiologies  suggested by labs . FOBT is still  pending.  Advised to stop asa , meloxicam was stopped March 23,  And iron supplements recommended today.  Will need to see nephrology for monitoring need for Aranesp   Lab Results  Component Value Date   WBC 7.8 05/19/2020   HGB 10.2 (L) 05/19/2020   HCT 32.1 (L) 05/19/2020   MCV 85.2 05/19/2020   PLT 226.0 05/19/2020

## 2020-05-23 NOTE — Assessment & Plan Note (Addendum)
He has bilateral hand numbness aggravated by arthritis.Marland Kitchen  He refuses any further workup given the perceived failure of his anterior cervical fusion in 2006 .  He requires use of oxycodone 15 mg every 6 hours prn  And   he has not had any ER visits  And has not requested any early refills.   Refill history was confirmed via [place] Controlled Substance database by me today during visit .  Tthe rx dated march 22 was filled on May 6,  So he has 2 more  refills on file that havee not been used.   there have been no prescriptions of controlled substances filled from any providers other than me. Marland Kitchen

## 2020-05-23 NOTE — Assessment & Plan Note (Signed)
RLL infiltrate noted on chest x ray today,  due to persistent dyspnea and decreased BS RLL.  Starting levaquin,  renally dosed.  (500 mg day 1,  250 mg daily x 6 )

## 2020-05-23 NOTE — Assessment & Plan Note (Signed)
meloxicam and furosemide suspended  .  Repeat assessment is without change,   MM rule dout with  SPEP and urine IFE   History of nephrectomy.   Nephrology referral in process to prior provider D r [name].   Lab Results  Component Value Date   CREATININE 1.71 (H) 05/19/2020

## 2020-05-23 NOTE — Assessment & Plan Note (Signed)
Now with altered urinary stream suggesting fibrosis causing spray.  Advised to follow up wit Dr Lonna Cobb

## 2020-05-23 NOTE — Assessment & Plan Note (Addendum)
He has been referred to pulmonology but prefers to be seen by me most of the time.  Chest x ray suggests RLL pneumonia. .  Adding Levaquin .   He is oxygen dependent and currently on 4 L

## 2020-05-27 ENCOUNTER — Ambulatory Visit (INDEPENDENT_AMBULATORY_CARE_PROVIDER_SITE_OTHER): Payer: Medicare HMO | Admitting: Pharmacist

## 2020-05-27 DIAGNOSIS — I2583 Coronary atherosclerosis due to lipid rich plaque: Secondary | ICD-10-CM | POA: Diagnosis not present

## 2020-05-27 DIAGNOSIS — I2510 Atherosclerotic heart disease of native coronary artery without angina pectoris: Secondary | ICD-10-CM | POA: Diagnosis not present

## 2020-05-27 DIAGNOSIS — J431 Panlobular emphysema: Secondary | ICD-10-CM

## 2020-05-27 DIAGNOSIS — I5030 Unspecified diastolic (congestive) heart failure: Secondary | ICD-10-CM | POA: Diagnosis not present

## 2020-05-27 DIAGNOSIS — E782 Mixed hyperlipidemia: Secondary | ICD-10-CM

## 2020-05-27 NOTE — Chronic Care Management (AMB) (Signed)
Chronic Care Management   Follow Up Note   05/27/2020 Name: FLYNN GWYN MRN: [id number] DOB: 28-Aug-1946  Referred by: Crecencio Mc, MD Reason for referral : Chronic Care Management (Medication Management)   KEMAR PANDIT is a 74 y.o. year old male who is a primary care patient of Tullo, Aris Everts, MD. The CCM team was consulted for assistance with chronic disease management and care coordination needs.    Contacted patient for medication management review.   Review of patient status, including review of consultants reports, relevant laboratory and other test results, and collaboration with appropriate care team members and the patient's provider was performed as part of comprehensive patient evaluation and provision of chronic care management services.    SDOH (Social Determinants of Health) assessments performed: No See Care Plan activities for detailed interventions related to [facility])     Outpatient Encounter Medications as of 05/27/2020  Medication Sig Note   amLODipine (NORVASC) 5 MG tablet Take 1 tablet (5 mg total) by mouth daily.    carvedilol (COREG) 12.5 MG tablet Take 1 tablet (12.5 mg total) by mouth 2 (two) times daily with a meal.    ferrous fumarate-iron polysaccharide complex (TANDEM) 162-115.2 MG CAPS capsule Take 1 capsule by mouth daily with breakfast.    Fluticasone-Umeclidin-Vilant (TRELEGY ELLIPTA) 100-62.5-25 MCG/INH AEPB Inhale 1 puff into the lungs daily.    furosemide (LASIX) 20 MG tablet Take 1 tablet (20 mg total) by mouth every other day.    ipratropium-albuterol (DUONEB) 0.5-2.5 (3) MG/3ML SOLN Take 3 mLs by nebulization every 6 (six) hours as needed (wheezing). DX: COPD DX Code: J44.9 01/12/2020: Using 2-4 times daily    levofloxacin (LEVAQUIN) 250 MG tablet 2 Tablets on DAy 1,  Then one tablet daily for 6 days    losartan-hydrochlorothiazide (HYZAAR) 100-25 MG tablet Take 1 tablet by mouth daily. 12/11/2019: QAM   methocarbamol  (ROBAXIN) 500 MG tablet Take 1 tablet (500 mg total) by mouth every 8 (eight) hours as needed for muscle spasms. 12/11/2019: PRN   Multiple Vitamin (MULTIVITAMIN) capsule Take 1 capsule by mouth daily.    oxyCODONE (ROXICODONE) 15 MG immediate release tablet Take 1 tablet (15 mg total) by mouth every 6 (six) hours as needed for pain.    OXYGEN Inhale 2 L into the lungs 3 (three) times daily as needed (shortness of breath).     simvastatin (ZOCOR) 20 MG tablet Take 1 tablet (20 mg total) by mouth at bedtime.    VENTOLIN HFA 108 (90 Base) MCG/ACT inhaler INHALE 2 PUFFS EVERY 6 HOURS AS NEEDED FOR WHEEZING OR SHORTNESS OF BREATH    vitamin B-12 (CYANOCOBALAMIN) 1000 MCG tablet Take 1,000 mcg by mouth daily.    ipratropium (ATROVENT) 0.03 % nasal spray Place 2 sprays into both nostrils 2 (two) times daily. 09/04/2019: PRN   nitroGLYCERIN (NITROSTAT) 0.4 MG SL tablet Place 1 tablet (0.4 mg total) under the tongue every 5 (five) minutes as needed for chest pain. Maximum dose 3 tablets    oxyCODONE (ROXICODONE) 15 MG immediate release tablet Take 1 tablet (15 mg total) by mouth every 6 (six) hours as needed for pain.    oxyCODONE (ROXICODONE) 15 MG immediate release tablet Take 1 tablet (15 mg total) by mouth every 6 (six) hours as needed for pain.    [DISCONTINUED] predniSONE (DELTASONE) 10 MG tablet 6 tablets daily for 3 days, then reduce by 1 tablet daily until gone (Patient not taking: Reported on 05/21/2020)    No  facility-administered encounter medications on file as of 05/27/2020.     Objective:   Goals Addressed            This Visit's Progress     Patient Stated    "My breathing is bad" (pt-stated)       CARE PLAN ENTRY (see longtitudinal plan of care for additional care plan information)  Current Barriers:   Polypharmacy; complex patient with multiple comorbidities including COPD (hx tobacco abuse); CHF (reduced but recovered, most recent 55%), CAD (hx MI, CVA x2), CKD  (single kidney)  Recent office visit, dx pneumonia. Started on levofloxacin. Patient confirms having started this, but was unaware of the probiotic recommendation. F/u CXR was scheduled  o COPD: Trelegy daily, albuterol HFA or Duonebs TID-QID; oxygen 24 hours. Notes that he is frustrated w/ deconditioning and not being able to do much w/o SOB. Notes hx pulmonary rehab, but that he has no interest in going back o Anemia w/ CKD: recommended to f/u with Dr. [name]. Patient notes he has not heard from that office for scheduling. Has not started iron supplement Rx, notes he thought he wasn't supposed to take it o CHF/CAD: Carvedilol 12.5 mg BID; losartan 100/25 mg daily, amlodipine 5 mg daily. o ASCVD risk reduction: ASA 81 mg daily, simvastatin 20 mg daily; last LDL <70; o Chronic pain: oxycodone 15 mg Q6H prn. PRN methocarbamol use, very infrequentl o Urinary symptoms: recommended to schedule sooner f/u with Dr. Lonna Cobb d/t recent symptoms. Patient has not called his office yet to reschedule  Pharmacist Clinical Goal(s):   Over the next 90 days, patient will work with PharmD and provider towards optimized medication management  Interventions:  Comprehensive medication review performed, medication list updated in electronic medical record  Inter-disciplinary care team collaboration (see longitudinal plan of care)  Reviewed recommendations from Dr. Darrick Huntsman to start probiotic while on ABX therapy.   Reviewed recommendation for iron supplementation. He confirms he will pick up this prescription from [facility]  Discussed recommendation to stay active at home to prevent further deconditioning. Declines PT or pulm rehab. Encouraged to walk daily as recommended by Dr. Darrick Huntsman  Provided phone number for Dr. Doristine Church office. Patient is to call next week if he has not heard from nephrology to schedule appointment  Encouraged to call Dr. Lonna Cobb to schedule f/u.   Reviewed recommendation for compression hose  to help w/ LEE. Patient notes that he will start wearing these more often  Patient Self Care Activities:   Patient will take medications as prescribed  Please see past updates related to this goal by clicking on the "Past Updates" button in the selected goal          Plan:  - Scheduled f/u call in ~ 12 weeks  Catie Feliz Beam, PharmD, [place], CPP Clinical Pharmacist [facility] [employer] [phone]

## 2020-05-27 NOTE — Patient Instructions (Signed)
Visit Information  Goals Addressed            This Visit's Progress     Patient Stated   . "My breathing is bad" (pt-stated)       CARE PLAN ENTRY (see longtitudinal plan of care for additional care plan information)  Current Barriers:  . Polypharmacy; complex patient with multiple comorbidities including COPD (hx tobacco abuse); CHF (reduced but recovered, most recent 55%), CAD (hx MI, CVA x2), CKD (single kidney) . Recent office visit, dx pneumonia. Started on levofloxacin. Patient confirms having started this, but was unaware of the probiotic recommendation. F/u CXR was scheduled  o COPD: Trelegy daily, albuterol HFA or Duonebs TID-QID; oxygen 24 hours. Notes that he is frustrated w/ deconditioning and not being able to do much w/o SOB. Notes hx pulmonary rehab, but that he has no interest in going back o Anemia w/ CKD: recommended to f/u with Dr. [name]. Patient notes he has not heard from that office for scheduling. Has not started iron supplement Rx, notes he thought he wasn't supposed to take it o CHF/CAD: Carvedilol 12.5 mg BID; losartan 100/25 mg daily, amlodipine 5 mg daily. o ASCVD risk reduction: ASA 81 mg daily, simvastatin 20 mg daily; last LDL <70; o Chronic pain: oxycodone 15 mg Q6H prn. PRN methocarbamol use, very infrequentl o Urinary symptoms: recommended to schedule sooner f/u with Dr. Lonna Cobb d/t recent symptoms. Patient has not called his office yet to reschedule  Pharmacist Clinical Goal(s):  Marland Kitchen Over the next 90 days, patient will work with PharmD and provider towards optimized medication management  Interventions: . Comprehensive medication review performed, medication list updated in electronic medical record . Inter-disciplinary care team collaboration (see longitudinal plan of care) . Reviewed recommendations from Dr. Darrick Huntsman to start probiotic while on ABX therapy.  . Reviewed recommendation for iron supplementation. He confirms he will pick up this prescription  from [facility] . Discussed recommendation to stay active at home to prevent further deconditioning. Declines PT or pulm rehab. Encouraged to walk daily as recommended by Dr. Darrick Huntsman . Provided phone number for Dr. Doristine Church office. Patient is to call next week if he has not heard from nephrology to schedule appointment . Encouraged to call Dr. Lonna Cobb to schedule f/u.  Marland Kitchen Reviewed recommendation for compression hose to help w/ LEE. Patient notes that he will start wearing these more often  Patient Self Care Activities:  . Patient will take medications as prescribed  Please see past updates related to this goal by clicking on the "Past Updates" button in the selected goal         Patient verbalizes understanding of instructions provided today.   Plan:  - Scheduled f/u call in ~ 12 weeks  Catie Feliz Beam, PharmD, [place], CPP Clinical Pharmacist [facility] [employer] [phone]

## 2020-05-28 ENCOUNTER — Telehealth: Payer: Self-pay | Admitting: Internal Medicine

## 2020-05-28 MED ORDER — TANDEM 162-115.2 MG PO CAPS: 1.0000 | ORAL_CAPSULE | Freq: Every day | ORAL | 2 refills | Status: DC

## 2020-05-28 NOTE — Telephone Encounter (Signed)
Pt states that [facility] on [address] states that they never received rx for ferrous fumarate-iron polysaccharide complex (TANDEM) 162-115.2 MG CAPS capsule. Pt has not taken it in a week and wants it asap. He also would like a telephone call.

## 2020-05-28 NOTE — Telephone Encounter (Signed)
Medication has been refilled and pt is aware that it was sent to [facility] on [address].

## 2020-06-06 DIAGNOSIS — J449 Chronic obstructive pulmonary disease, unspecified: Secondary | ICD-10-CM | POA: Diagnosis not present

## 2020-06-07 ENCOUNTER — Telehealth: Payer: Self-pay | Admitting: Internal Medicine

## 2020-06-07 NOTE — Telephone Encounter (Signed)
Pt called in and stated that his legs had gotten worse and he made a appt. at the [facility] and wanted to know if she had any advice about his legs

## 2020-06-08 NOTE — Telephone Encounter (Addendum)
Left message for patient to return call back.  

## 2020-06-08 NOTE — Telephone Encounter (Signed)
I don't recommend increasing his fluid pill unless he is following the chf protocol   monitor wt daily and double the lasix dose for overnight weight gain of 2 lbs or weekly weight gain of 5 lbs.

## 2020-06-09 ENCOUNTER — Other Ambulatory Visit: Payer: Self-pay

## 2020-06-09 ENCOUNTER — Ambulatory Visit: Payer: Medicare HMO | Admitting: Urology

## 2020-06-09 ENCOUNTER — Encounter: Payer: Self-pay | Admitting: Urology

## 2020-06-09 VITALS — BP 148/74 | HR 69 | Ht 72.0 in | Wt 297.0 lb

## 2020-06-09 DIAGNOSIS — R350 Frequency of micturition: Secondary | ICD-10-CM

## 2020-06-09 DIAGNOSIS — N401 Enlarged prostate with lower urinary tract symptoms: Secondary | ICD-10-CM

## 2020-06-09 DIAGNOSIS — R39198 Other difficulties with micturition: Secondary | ICD-10-CM | POA: Diagnosis not present

## 2020-06-09 MED ORDER — TAMSULOSIN HCL 0.4 MG PO CAPS
0.4000 mg | ORAL_CAPSULE | Freq: Every day | ORAL | 1 refills | Status: DC
Start: 2020-06-09 — End: 2020-08-23
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
## 2020-06-09 NOTE — Patient Instructions (Signed)
Patient to have a cysto if the medication is not helping.

## 2020-06-10 ENCOUNTER — Other Ambulatory Visit: Payer: Self-pay | Admitting: Internal Medicine

## 2020-06-17 ENCOUNTER — Other Ambulatory Visit: Payer: Self-pay

## 2020-06-17 ENCOUNTER — Telehealth: Payer: Self-pay

## 2020-06-17 ENCOUNTER — Encounter: Payer: Medicare HMO | Attending: Physician Assistant | Admitting: Physician Assistant

## 2020-06-17 DIAGNOSIS — L97829 Non-pressure chronic ulcer of other part of left lower leg with unspecified severity: Secondary | ICD-10-CM | POA: Insufficient documentation

## 2020-06-17 DIAGNOSIS — I890 Lymphedema, not elsewhere classified: Secondary | ICD-10-CM | POA: Diagnosis not present

## 2020-06-17 DIAGNOSIS — I110 Hypertensive heart disease with heart failure: Secondary | ICD-10-CM | POA: Diagnosis not present

## 2020-06-17 DIAGNOSIS — E11622 Type 2 diabetes mellitus with other skin ulcer: Secondary | ICD-10-CM | POA: Insufficient documentation

## 2020-06-17 DIAGNOSIS — M1990 Unspecified osteoarthritis, unspecified site: Secondary | ICD-10-CM | POA: Insufficient documentation

## 2020-06-17 DIAGNOSIS — Z87891 Personal history of nicotine dependence: Secondary | ICD-10-CM | POA: Diagnosis not present

## 2020-06-17 DIAGNOSIS — I872 Venous insufficiency (chronic) (peripheral): Secondary | ICD-10-CM | POA: Insufficient documentation

## 2020-06-17 DIAGNOSIS — I2510 Atherosclerotic heart disease of native coronary artery without angina pectoris: Secondary | ICD-10-CM | POA: Diagnosis not present

## 2020-06-17 DIAGNOSIS — I509 Heart failure, unspecified: Secondary | ICD-10-CM | POA: Diagnosis not present

## 2020-06-17 DIAGNOSIS — J449 Chronic obstructive pulmonary disease, unspecified: Secondary | ICD-10-CM | POA: Diagnosis not present

## 2020-06-17 DIAGNOSIS — E119 Type 2 diabetes mellitus without complications: Secondary | ICD-10-CM | POA: Diagnosis not present

## 2020-06-17 DIAGNOSIS — G4733 Obstructive sleep apnea (adult) (pediatric): Secondary | ICD-10-CM | POA: Insufficient documentation

## 2020-06-17 DIAGNOSIS — Z9981 Dependence on supplemental oxygen: Secondary | ICD-10-CM | POA: Insufficient documentation

## 2020-06-17 NOTE — Telephone Encounter (Signed)
error 

## 2020-06-17 NOTE — Progress Notes (Signed)
TYRE, BEAVER ([id number]) Visit Report for 06/17/2020 Chief Complaint Document Details Patient Name: Victor Daniels, Victor Daniels. Date of Service: 06/17/2020 12:45 PM Medical Record Number: [id number] Patient Account Number: [id number] Date of Birth/Sex: September 12, 1946 (74 y.o. M) Treating RN: Rodell Perna Primary Care Provider: Duncan Dull Other Clinician: Referring Provider: Duncan Dull Treating Provider/Extender: Linwood Dibbles, Evanna Washinton Weeks in Treatment: 0 Information Obtained from: Patient Chief Complaint Bilateral LE Lymphedema Electronic Signature(s) Signed: 06/17/2020 1:27:09 PM By: Lenda Kelp PA-C Entered By: Lenda Kelp on 06/17/2020 13:27:09 Victor Daniels ([id number]) -------------------------------------------------------------------------------- HPI Details Patient Name: Victor Daniels. Date of Service: 06/17/2020 12:45 PM Medical Record Number: [id number] Patient Account Number: [id number] Date of Birth/Sex: 1946-06-08 (74 y.o. M) Treating RN: Rodell Perna Primary Care Provider: Duncan Dull Other Clinician: Referring Provider: Duncan Dull Treating Provider/Extender: Linwood Dibbles, Darlyne Schmiesing Weeks in Treatment: 0 History of Present Illness Location: Patient has bilateral venous stasis with ulcerations with inflammation of the left leg Severity: The wounds are very clean and had been improving and measured smaller with silver collagen Duration: The patient has had problems with venous ulcers bilaterally from time to time for the last couple of years HPI Description: 07/24/18 This is a now 74 year old man who we have had in this clinic 3 times in the past. In 2015 he was here for left leg ulcer felt to be secondary to chronic venous insufficiency. In September 2017 here for bilateral lower extremity wounds again felt to be secondary to chronic venous insufficiency. He was discharged on this Occasion with 2030 mm below-knee stockings although there was a  comment that he would probably need more than that perhaps 30-40 below-knee stockings. The last time he was here was from 10/17/17 through 10/31/17. He had a traumatic wound on his left great toe that closed. I had ordered arterial studies on him at that point although it doesn't look like these were ever done. Currently the patient says about 6 weeks ago he started developing more swelling in the left leg. The left leg is chronically more swollen than the right but he said the swelling got to the point where his foot and ankle were tight. He then developed a large blistero Hematoma on the left anterior tibial area with 2 other small wounds anteriorly and one posteriorly. He is only been putting gauze on these. He has very old 20-30 mm below-knee stockings but I doubt he is getting enough compression from these. Past medical history is remarkable for COPD on what appears to be when necessary oxygen, obstructive sleep apnea [has not tolerated CPAP], type 2 diabetes on diet control with a recent hemoglobin A1c of 6.4. also hypertension, coronary artery disease and congestive heart failure. The patient tells me he had a CT scan of his chest 6-8 weeks ago to follow-up for a left upper lobe pulmonary nodule. ABIs were noncompressible in this clinic bilaterally 07/31/18-He is seen in follow-up evaluation for left lateral lower leg ulceration. He is tolerating compression therapy. He has been submitted for new compression stockings. His current compression stockings appear to be low-grade (8-12, 15-20 mmHg), he also admits using these improperly applying at bedtime and removing in the morning. He has been reeducated on appropriate compression stocking where. We will continue with same treatment plan he will follow-up next week. He has also been encouraged to maintain compression, not to remove prior to follow up appointment 08/07/18-He is seen in follow-up evaluation for left lower extremity ulcers. There is  significant improvement and we will  continue with same treatment plan. He did obtain to repair of 20-30 mmHg compression stockings from elastic therapy but states that he cannot put these on and as been wearing his old stockings to the right leg. He will be ordering stockings through [facility] tonight. 08/14/18; he is here for follow-up evaluation of his left lower extremity ulcer. This is anteriorly and really is a lot smaller with only a small healthy- looking open area remaining. He did obtain appear of 20/30 mm compression [place] [facility] but states these were too tight he couldn't get him on. He brings in some 15 mm support hose, I told him that I don't think this will be sufficient. He does not have sufficient funds to pay for wraparound stockings such as juxtalite stockings 08/21/18; his left lateral lower extremity ulcer is healed. He has a mixture of different stockings. The 20/30 stockings from [place] did not fit him. We did manage to get one of the 20-30 mm stockings on him today. He is going to order a 2x stocking from [employer].in any case his wounds are closed Readmission: 06/17/2020 upon evaluation today patient appears to be doing excellent at this time in regard to the fact that he does not have any actual open wounds currently. Unfortunately he does have severe stage III lymphedema and that he does have weeping and blistering that occurs off and on frequently. I do believe that he is going to be requiring intervention with compression therapy. With that being said for now I think Tubigrip is probably can be what we can have to hold onto until we get the results of a arterial study with ABI and TBI. He was previously noncompressible and again he has not seen vascular for some time. Fortunately there is no evidence of active infection at this time which is great news. With that being said he does have COPD and he does have difficulty breathing as well. I am  concerned about compression in general and whether or not working to cause issues with significant complications with his breathing if we indeed do compression wrap him significantly. He may also be a candidate for visit to lymphedema clinic if we get things under control. Nonetheless I do think we can need to coordinate to some degree with Dr. [name] his primary care provider. Electronic Signature(s) Signed: 06/17/2020 3:24:54 PM By: Lenda KelpStone III, Angelys Yetman PA-C Entered By: Lenda KelpStone III, Yazen Rosko on 06/17/2020 15:24:54 Victor KempKIRCHGESSNER, Jahmir R. ([id number]) -------------------------------------------------------------------------------- Physical Exam Details Patient Name: Victor KempKIRCHGESSNER, Khair R. Date of Service: 06/17/2020 12:45 PM Medical Record Number: [id number] Patient Account Number: [id number] Date of Birth/Sex: 05-Jul-1946 [age] y.o. M) Treating RN: Rodell PernaScott, Dajea Primary Care Provider: Duncan Dullullo, Teresa Other Clinician: Referring Provider: Duncan Dullullo, Teresa Treating Provider/Extender: Linwood DibblesSTONE III, Saaya Procell Weeks in Treatment: 0 Constitutional sitting or standing blood pressure is within target range for patient.. pulse regular and within target range for patient.Marland Kitchen. respirations regular, non- labored and within target range for patient.Marland Kitchen. temperature within target range for patient.. Well-nourished and well-hydrated in no acute distress. Eyes conjunctiva clear no eyelid edema noted. pupils equal round and reactive to light and accommodation. Ears, Nose, Mouth, and Throat no gross abnormality of ear auricles or external auditory canals. normal hearing noted during conversation. mucus membranes moist. Respiratory . Cardiovascular Patient has no palpable pulses but we were able to obtain pulses with the Doppler today in the clinic his foot is warm to touch. ABIs are noncompressible.. Patient has severe stage III lymphedema. He is not able to wear  compression stockings as he tells me that he cannot really get them  on.. Musculoskeletal Patient unable to walk without assistance. no significant deformity or arthritic changes, no loss or range of motion, no clubbing. Psychiatric this patient is able to make decisions and demonstrates good insight into disease process. Alert and Oriented x 3. pleasant and cooperative. Notes Upon inspection patient's wound bed actually showed signs of having no open wounds at this time. He does have scattered areas of blistering unfortunately that are of concern and he tells me that sometimes his lines will drain more than other times. He is on a dose of Lasix that Dr. Darrick Huntsman felt was appropriate for the time being. She did refer him to [place] for further evaluation and treatment. Electronic Signature(s) Signed: 06/17/2020 3:26:14 PM By: Lenda Kelp PA-C Entered By: Lenda Kelp on 06/17/2020 15:26:14 Victor Daniels ([id number]) -------------------------------------------------------------------------------- Physician Orders Details Patient Name: SAVON, BORDONARO. Date of Service: 06/17/2020 12:45 PM Medical Record Number: [id number] Patient Account Number: [id number] Date of Birth/Sex: 08-15-1946 (74 y.o. M) Treating RN: Huel Coventry Primary Care Provider: Duncan Dull Other Clinician: Referring Provider: Duncan Dull Treating Provider/Extender: Linwood Dibbles, Mindel Friscia Weeks in Treatment: 0 Verbal / Phone Orders: No Diagnosis Coding Follow-up Appointments o Return Appointment in 2 weeks. Edema Control o Other: - Tubi-grip G Services and Therapies o Arterial Studies- Bilateral - ABI/TBI Electronic Signature(s) Signed: 06/17/2020 4:29:48 PM By: Lenda Kelp PA-C Signed: 06/17/2020 4:32:50 PM By: Elliot Gurney, BSN, RN, CWS, Kim RN, BSN Entered By: Elliot Gurney, BSN, RN, CWS, Kim on 06/17/2020 13:27:03 Victor Daniels ([id number]) -------------------------------------------------------------------------------- Problem List Details Patient Name: MEHRAN, GUDERIAN. Date of Service: 06/17/2020 12:45 PM Medical Record Number: [id number] Patient Account Number: [id number] Date of Birth/Sex: 1946-08-18 (74 y.o. M) Treating RN: Rodell Perna Primary Care Provider: Duncan Dull Other Clinician: Referring Provider: Duncan Dull Treating Provider/Extender: Linwood Dibbles, Leesha Veno Weeks in Treatment: 0 Active Problems ICD-10 Encounter Code Description Active Date MDM Diagnosis I89.0 Lymphedema, not elsewhere classified 06/17/2020 No Yes E11.622 Type 2 diabetes mellitus with other skin ulcer 06/17/2020 No Yes I87.2 Venous insufficiency (chronic) (peripheral) 06/17/2020 No Yes Inactive Problems Resolved Problems Electronic Signature(s) Signed: 06/17/2020 1:26:29 PM By: Lenda Kelp PA-C Entered By: Lenda Kelp on 06/17/2020 13:26:29 Victor Daniels ([id number]) -------------------------------------------------------------------------------- Progress Note Details Patient Name: Victor Daniels. Date of Service: 06/17/2020 12:45 PM Medical Record Number: [id number] Patient Account Number: [id number] Date of Birth/Sex: 03-Sep-1946 (74 y.o. M) Treating RN: Rodell Perna Primary Care Provider: Duncan Dull Other Clinician: Referring Provider: Duncan Dull Treating Provider/Extender: Linwood Dibbles, Delmar Arriaga Weeks in Treatment: 0 Subjective Chief Complaint Information obtained from Patient Bilateral LE Lymphedema History of Present Illness (HPI) The following HPI elements were documented for the patient's wound: Location: Patient has bilateral venous stasis with ulcerations with inflammation of the left leg Severity: The wounds are very clean and had been improving and measured smaller with silver collagen Duration: The patient has had problems with venous ulcers bilaterally from time to time for the last couple of years 07/24/18 This is a now 74 year old man who we have had in this clinic 3 times in the past. In 2015 he was here for left leg  ulcer felt to be secondary to chronic venous insufficiency. In September 2017 here for bilateral lower extremity wounds again felt to be secondary to chronic venous insufficiency. He was discharged on this Occasion with 2030 mm below-knee stockings although there was a comment that he would probably need more  than that perhaps 30-40 below-knee stockings. The last time he was here was from 10/17/17 through 10/31/17. He had a traumatic wound on his left great toe that closed. I had ordered arterial studies on him at that point although it doesn't look like these were ever done. Currently the patient says about 6 weeks ago he started developing more swelling in the left leg. The left leg is chronically more swollen than the right but he said the swelling got to the point where his foot and ankle were tight. He then developed a large blistero Hematoma on the left anterior tibial area with 2 other small wounds anteriorly and one posteriorly. He is only been putting gauze on these. He has very old 20-30 mm below-knee stockings but I doubt he is getting enough compression from these. Past medical history is remarkable for COPD on what appears to be when necessary oxygen, obstructive sleep apnea [has not tolerated CPAP], type 2 diabetes on diet control with a recent hemoglobin A1c of 6.4. also hypertension, coronary artery disease and congestive heart failure. The patient tells me he had a CT scan of his chest 6-8 weeks ago to follow-up for a left upper lobe pulmonary nodule. ABIs were noncompressible in this clinic bilaterally 07/31/18-He is seen in follow-up evaluation for left lateral lower leg ulceration. He is tolerating compression therapy. He has been submitted for new compression stockings. His current compression stockings appear to be low-grade (8-12, 15-20 mmHg), he also admits using these improperly applying at bedtime and removing in the morning. He has been reeducated on appropriate compression  stocking where. We will continue with same treatment plan he will follow-up next week. He has also been encouraged to maintain compression, not to remove prior to follow up appointment 08/07/18-He is seen in follow-up evaluation for left lower extremity ulcers. There is significant improvement and we will continue with same treatment plan. He did obtain to repair of 20-30 mmHg compression stockings from elastic therapy but states that he cannot put these on and as been wearing his old stockings to the right leg. He will be ordering stockings through [facility]. 08/14/18; he is here for follow-up evaluation of his left lower extremity ulcer. This is anteriorly and really is a lot smaller with only a small healthy- looking open area remaining. He did obtain appear of 20/30 mm compression stockings from elastic therapy and [place] but states these were too tight he couldn't get him on. He brings in some 15 mm support hose, I told him that I don't think this will be sufficient. He does not have sufficient funds to pay for wraparound stockings such as juxtalite stockings 08/21/18; his left lateral lower extremity ulcer is healed. He has a mixture of different stockings. The 20/30 stockings from  did not fit him. We did manage to get one of the 20-30 mm stockings on him today. He is going to order a 2x stocking from [employer].in any case his wounds are closed Readmission: 06/17/2020 upon evaluation today patient appears to be doing excellent at this time in regard to the fact that he does not have any actual open wounds currently. Unfortunately he does have severe stage III lymphedema and that he does have weeping and blistering that occurs off and on frequently. I do believe that he is going to be requiring intervention with compression therapy. With that being said for now I think Tubigrip is probably can be what we can have to hold onto until we get the results  of a arterial study with ABI  and TBI. He was previously noncompressible and again he has not seen vascular for some time. Fortunately there is no evidence of active infection at this time which is great news. With that being said he does have COPD and he does have difficulty breathing as well. I am concerned about compression in general and whether or not working to cause issues with significant complications with his breathing if we indeed do compression wrap him significantly. He may also be a candidate for visit to lymphedema clinic if we get things under control. Nonetheless I do think we can need to coordinate to some degree with Dr. Darrick Huntsman his primary care provider. Patient History Information obtained from Patient. Allergies No Known Drug Allergies Family History COURTEZ, TWADDLE ([id number]) Heart Disease - Father,Mother, Hypertension - Father,Mother, Stroke - SELF, Thyroid Problems - Mother, No family history of Cancer, Diabetes, Hereditary Spherocytosis, Kidney Disease, Lung Disease, Seizures, Tuberculosis. Social History Former smoker, Marital Status - Married, Alcohol Use - Never - quit, Drug Use - No History, Caffeine Use - Daily - coffee, coke. Medical History Eyes Patient has history of Cataracts - removed Hematologic/Lymphatic Patient has history of Anemia Respiratory Patient has history of Asthma, Chronic Obstructive Pulmonary Disease (COPD) Denies history of Sleep Apnea Cardiovascular Patient has history of Arrhythmia, Coronary Artery Disease, Hypertension Endocrine Patient has history of Type II Diabetes Musculoskeletal Patient has history of Osteoarthritis Oncologic Denies history of Received Chemotherapy, Received Radiation Patient is treated with Controlled Diet. Blood sugar is not tested. Medical And Surgical History Notes Respiratory pneumonectomy of right lung Genitourinary have only one kidney Objective Constitutional sitting or standing blood pressure is within target range  for patient.. pulse regular and within target range for patient.Marland Kitchen respirations regular, non- labored and within target range for patient.Marland Kitchen temperature within target range for patient.. Well-nourished and well-hydrated in no acute distress. Vitals Time Taken: 1:00 PM, Height: 72 in, Source: Stated, Weight: 297 lbs, Source: Stated, BMI: 40.3, Temperature: 97.5 F, Pulse: 62 bpm, Respiratory Rate: 20 breaths/min, Blood Pressure: 101/47 mmHg. Eyes conjunctiva clear no eyelid edema noted. pupils equal round and reactive to light and accommodation. Ears, Nose, Mouth, and Throat no gross abnormality of ear auricles or external auditory canals. normal hearing noted during conversation. mucus membranes moist. Cardiovascular Patient has no palpable pulses but we were able to obtain pulses with the Doppler today in the clinic his foot is warm to touch. ABIs are noncompressible.. Patient has severe stage III lymphedema. He is not able to wear compression stockings as he tells me that he cannot really get them on.. Musculoskeletal Patient unable to walk without assistance. no significant deformity or arthritic changes, no loss or range of motion, no clubbing. Psychiatric this patient is able to make decisions and demonstrates good insight into disease process. Alert and Oriented x 3. pleasant and cooperative. General Notes: Upon inspection patient's wound bed actually showed signs of having no open wounds at this time. He does have scattered areas of blistering unfortunately that are of concern and he tells me that sometimes his lines will drain more than other times. He is on a dose of Lasix that Dr. Darrick Huntsman felt was appropriate for the time being. She did refer him to [place] for further evaluation and treatment. Other Condition(s) Patient presents with Lymphedema located on the Bilateral Leg. LENNELL, SHANKS ([id number]) Assessment Active Problems ICD-10 Lymphedema, not elsewhere classified Type 2  diabetes mellitus with other skin ulcer Venous insufficiency (chronic) (  peripheral) Plan Follow-up Appointments: Return Appointment in 2 weeks. Edema Control: Other: - Tubi-grip G Services and Therapies ordered were: Arterial Studies- Bilateral - ABI/TBI 1. Based on what I am seeing at this time I do believe that the patient would benefit from a referral to [facility] where we can have an arterial study to see and ensure that he has good arterial flow. 2. Based on what I am seeing at this point I do believe the Tubigrip would be appropriate right now for him. With that being said I do believe that he could benefit from compression therapy and even a lymphedema clinic referral but I think that we do need to ensure that he has good arterial flow and that if we can confirm that subsequently the 2nd issue would be the concern of not being too much fluid into his lungs as a result of compression therapy. I did discuss this with Dr. Derrel Nip over the phone today and she is in agreement with once we initiate compression therapy increasing his Lasix and given him directions for what exactly to do in order to help with that potential for fluid overload so that we do not hopefully cause any trouble here. I think that is an appropriate way to go and I do appreciate her help in helping to manage our mutual patient as well. 3. In the meantime I do recommend patient needs to elevate his legs is much as possible try to keep edema under good control. We will see patient back for reevaluation in 2 weeks here in the clinic. If anything worsens or changes patient will contact our office for additional recommendations. Electronic Signature(s) Signed: 06/17/2020 3:27:42 PM By: Worthy Keeler PA-C Entered By: Worthy Keeler on 06/17/2020 15:27:41 Edson Snowball ([id number]) -------------------------------------------------------------------------------- ROS/PFSH Details Patient Name:  BYARD, CARRANZA. Date of Service: 06/17/2020 12:45 PM Medical Record Number: [id number] Patient Account Number: [id number] Date of Birth/Sex: 01-Apr-1946 (74 y.o. M) Treating RN: Cornell Barman Primary Care Provider: Deborra Medina Other Clinician: Referring Provider: Deborra Medina Treating Provider/Extender: Melburn Hake, Sandon Yoho Weeks in Treatment: 0 Information Obtained From Patient Eyes Medical History: Positive for: Cataracts - removed Hematologic/Lymphatic Medical History: Positive for: Anemia Respiratory Medical History: Positive for: Asthma; Chronic Obstructive Pulmonary Disease (COPD) Negative for: Sleep Apnea Past Medical History Notes: pneumonectomy of right lung Cardiovascular Medical History: Positive for: Arrhythmia; Coronary Artery Disease; Hypertension Endocrine Medical History: Positive for: Type II Diabetes Time with diabetes: 3 years Treated with: Diet Blood sugar tested every day: No Genitourinary Medical History: Past Medical History Notes: have only one kidney Musculoskeletal Medical History: Positive for: Osteoarthritis Oncologic Medical History: Negative for: Received Chemotherapy; Received Radiation HBO Extended History Items Eyes: Cataracts Immunizations Pneumococcal Vaccine: Received Pneumococcal Vaccination: Yes SHANE, BADEAUX ([id number]) Immunization Notes: up to date Implantable Devices No devices added Family and Social History Cancer: No; Diabetes: No; Heart Disease: Yes - Father,Mother; Hereditary Spherocytosis: No; Hypertension: Yes - Father,Mother; Kidney Disease: No; Lung Disease: No; Seizures: No; Stroke: Yes - SELF; Thyroid Problems: Yes - Mother; Tuberculosis: No; Former smoker; Marital Status - Married; Alcohol Use: Never - quit; Drug Use: No History; Caffeine Use: Daily - coffee, coke; Financial Concerns: No; Food, Clothing or Shelter Needs: No; Support System Lacking: No; Transportation Concerns: No Electronic  Signature(s) Signed: 06/17/2020 4:29:48 PM By: Worthy Keeler PA-C Signed: 06/17/2020 4:32:50 PM By: Gretta Cool, BSN, RN, CWS, Kim RN, BSN Entered By: Gretta Cool, BSN, RN, CWS, Kim on 06/17/2020 13:14:01 Jamse Belfast R. (  [id number]) -------------------------------------------------------------------------------- SuperBill Details Patient Name: WOODFIN, KISS. Date of Service: 06/17/2020 Medical Record Number: [id number] Patient Account Number: [id number] Date of Birth/Sex: May 20, 1946 (74 y.o. M) Treating RN: Rodell Perna Primary Care Provider: Duncan Dull Other Clinician: Referring Provider: Duncan Dull Treating Provider/Extender: Linwood Dibbles, Karli Wickizer Weeks in Treatment: 0 Diagnosis Coding ICD-10 Codes Code Description I89.0 Lymphedema, not elsewhere classified E11.622 Type 2 diabetes mellitus with other skin ulcer I87.2 Venous insufficiency (chronic) (peripheral) Facility Procedures CPT4 Code: 40102725 Description: 99214 - WOUND CARE VISIT-LEV 4 EST PT Modifier: Quantity: 1 Physician Procedures CPT4 Code: 3664403 Description: 99214 - WC PHYS LEVEL 4 - EST PT Modifier: Quantity: 1 CPT4 Code: Description: ICD-10 Diagnosis Description I89.0 Lymphedema, not elsewhere classified E11.622 Type 2 diabetes mellitus with other skin ulcer I87.2 Venous insufficiency (chronic) (peripheral) Modifier: Quantity: Electronic Signature(s) Signed: 06/17/2020 3:28:14 PM By: Lenda Kelp PA-C Previous Signature: 06/17/2020 3:27:59 PM Version By: Lenda Kelp PA-C Entered By: Lenda Kelp on 06/17/2020 15:28:13

## 2020-06-17 NOTE — Progress Notes (Signed)
XION, DEBRUYNE ([id number]) Visit Report for 06/17/2020 Abuse/Suicide Risk Screen Details Patient Name: Victor Daniels, Victor Daniels. Date of Service: 06/17/2020 12:45 PM Medical Record Number: [id number] Patient Account Number: [id number] Date of Birth/Sex: 09/20/1946 (74 y.o. M) Treating RN: Cornell Barman Primary Care Rahma Meller: Deborra Medina Other Clinician: Referring Adalaya Irion: Deborra Medina Treating Katherin Ramey/Extender: Melburn Hake, HOYT Weeks in Treatment: 0 Abuse/Suicide Risk Screen Items Answer ABUSE RISK SCREEN: Has anyone close to you tried to hurt or harm you recentlyo No Do you feel uncomfortable with anyone in your familyo No Has anyone forced you do things that you didnot want to doo No Electronic Signature(s) Signed: 06/17/2020 4:32:50 PM By: Gretta Cool, BSN, RN, CWS, Kim RN, BSN Entered By: Gretta Cool, BSN, RN, CWS, Kim on 06/17/2020 13:14:07 Victor Daniels ([id number]) -------------------------------------------------------------------------------- Activities of Daily Living Details Patient Name: Victor Daniels, Victor Daniels. Date of Service: 06/17/2020 12:45 PM Medical Record Number: [id number] Patient Account Number: [id number] Date of Birth/Sex: 1946/07/25 (74 y.o. M) Treating RN: Cornell Barman Primary Care Cortlin Marano: Deborra Medina Other Clinician: Referring Shallyn Constancio: Deborra Medina Treating Codey Burling/Extender: Melburn Hake, HOYT Weeks in Treatment: 0 Activities of Daily Living Items Answer Activities of Daily Living (Please select one for each item) Drive Automobile Not Able Take Medications Completely Able Use Telephone Completely Able Care for Appearance Need Assistance Use Toilet Need Assistance Bath / Shower Need Assistance Dress Self Need Assistance Feed Self Completely Able Walk Need Assistance Get In / Out Bed Need Assistance Housework Need Assistance Prepare Meals Need Assistance Handle Money Need Assistance Shop for Self Need Assistance Electronic  Signature(s) Signed: 06/17/2020 4:32:50 PM By: Gretta Cool, BSN, RN, CWS, Kim RN, BSN Entered By: Gretta Cool, BSN, RN, CWS, Kim on 06/17/2020 13:14:46 Victor Daniels ([id number]) -------------------------------------------------------------------------------- Education Screening Details Patient Name: Victor Daniels, Victor Daniels. Date of Service: 06/17/2020 12:45 PM Medical Record Number: [id number] Patient Account Number: [id number] Date of Birth/Sex: [birth year] (74 y.o. M) Treating RN: Cornell Barman Primary Care Romey Mathieson: Deborra Medina Other Clinician: Referring Cornella Emmer: Deborra Medina Treating Andrei Mccook/Extender: Melburn Hake, HOYT Weeks in Treatment: 0 Primary Learner Assessed: Patient Learning Preferences/Education Level/Primary Language Learning Preference: Explanation Preferred Language: English Physical Barrier Impaired Vision: Yes Glasses Impaired Hearing: No Decreased Hand dexterity: No Knowledge/Comprehension Knowledge Level: Medium Comprehension Level: Medium Ability to understand written instructions: Medium Ability to understand verbal instructions: Medium Motivation Anxiety Level: Calm Cooperation: Cooperative Education Importance: Acknowledges Need Interest in Health Problems: Asks Questions Perception: Coherent Willingness to Engage in Self-Management High Activities: Readiness to Engage in Self-Management High Activities: Engineer, maintenance) Signed: 06/17/2020 4:32:50 PM By: Gretta Cool, BSN, RN, CWS, Kim RN, BSN Entered By: Gretta Cool, BSN, RN, CWS, Kim on 06/17/2020 13:15:24 Victor Daniels ([id number]) -------------------------------------------------------------------------------- Fall Risk Assessment Details Patient Name: Victor Daniels. Date of Service: 06/17/2020 12:45 PM Medical Record Number: [id number] Patient Account Number: [id number] Date of Birth/Sex: 24-Feb-1946 (74 y.o. M) Treating RN: Cornell Barman Primary Care Renette Hsu: Deborra Medina Other  Clinician: Referring Jaymari Cromie: Deborra Medina Treating Imani Fiebelkorn/Extender: Melburn Hake, HOYT Weeks in Treatment: 0 Fall Risk Assessment Items Have you had 2 or more falls in the last 12 monthso 0 Yes Have you had any fall that resulted in injury in the last 12 monthso 0 No FALLS RISK SCREEN History of falling - immediate or within 3 months 0 No Secondary diagnosis (Do you have 2 or more medical diagnoseso) 0 No Ambulatory aid None/bed rest/wheelchair/nurse 0 Yes Crutches/cane/walker 0 No Furniture 0 No Intravenous therapy Access/Saline/Heparin Lock 0 No Gait/Transferring Normal/ bed rest/ wheelchair 0 Yes Weak (short steps with  or without shuffle, stooped but able to lift head while walking, may 0 No seek support from furniture) Impaired (short steps with shuffle, may have difficulty arising from chair, head down, impaired 0 No balance) Mental Status Oriented to own ability 0 Yes Electronic Signature(s) Signed: 06/17/2020 4:32:50 PM By: Elliot Gurney, BSN, RN, CWS, Kim RN, BSN Entered By: Elliot Gurney, BSN, RN, CWS, Kim on 06/17/2020 13:16:00 [name] ([id number]) -------------------------------------------------------------------------------- Foot Assessment Details Patient Name: Victor Daniels, Victor Daniels. Date of Service: 06/17/2020 12:45 PM Medical Record Number: [id number] Patient Account Number: [id number] Date of Birth/Sex: Jun 17, 1946 (74 y.o. M) Treating RN: Huel Coventry Primary Care Beverlie Kurihara: Duncan Dull Other Clinician: Referring Aqil Goetting: Duncan Dull Treating Diar Berkel/Extender: Linwood Dibbles, HOYT Weeks in Treatment: 0 Foot Assessment Items Site Locations + = Sensation present, - = Sensation absent, C = Callus, U = Ulcer R = Redness, W = Warmth, M = Maceration, PU = Pre-ulcerative lesion F = Fissure, S = Swelling, D = Dryness Assessment Right: Left: Other Deformity: No No Prior Foot Ulcer: No No Prior Amputation: No No Charcot Joint: No No Ambulatory Status: Ambulatory  With Help Assistance Device: Cane Gait: Surveyor, mining) Signed: 06/17/2020 4:32:50 PM By: Elliot Gurney, BSN, RN, CWS, Kim RN, BSN Entered By: Elliot Gurney, BSN, RN, CWS, Kim on 06/17/2020 13:18:17 [name] ([id number]) -------------------------------------------------------------------------------- Nutrition Risk Screening Details Patient Name: Victor Daniels, Victor Daniels. Date of Service: 06/17/2020 12:45 PM Medical Record Number: [id number] Patient Account Number: [id number] Date of Birth/Sex: 03-01-1946 (74 y.o. M) Treating RN: Huel Coventry Primary Care Breauna Mazzeo: Duncan Dull Other Clinician: Referring Lynia Landry: Duncan Dull Treating Neville Pauls/Extender: Linwood Dibbles, HOYT Weeks in Treatment: 0 Height (in): 72 Weight (lbs): 297 Body Mass Index (BMI): 40.3 Nutrition Risk Screening Items Score Screening NUTRITION RISK SCREEN: I have an illness or condition that made me change the kind and/or amount of food I eat 0 No I eat fewer than two meals per day 0 No I eat few fruits and vegetables, or milk products 0 No I have three or more drinks of beer, liquor or wine almost every day 0 No I have tooth or mouth problems that make it hard for me to eat 0 No I don't always have enough money to buy the food I need 0 No I eat alone most of the time 0 No I take three or more different prescribed or over-the-counter drugs a day 1 Yes Without wanting to, I have lost or gained 10 pounds in the last six months 0 No I am not always physically able to shop, cook and/or feed myself 0 No Nutrition Protocols Good Risk Protocol Moderate Risk Protocol High Risk Proctocol Risk Level: Good Risk Score: 1 Electronic Signature(s) Signed: 06/17/2020 4:32:50 PM By: Elliot Gurney, BSN, RN, CWS, Kim RN, BSN Entered By: Elliot Gurney, BSN, RN, CWS, Kim on 06/17/2020 13:16:11

## 2020-06-17 NOTE — Progress Notes (Addendum)
JAQUIS, PICKLESIMER ([id number]) Visit Report for 06/17/2020 Allergy List Details Patient Name: Victor Daniels, Victor Daniels. Date of Service: 06/17/2020 12:45 PM Medical Record Number: [id number] Patient Account Number: [id number] Date of Birth/Sex: September 19, 1946 (74 y.o. M) Treating RN: Huel Coventry Primary Care Kaneesha Constantino: Duncan Dull Other Clinician: Referring Motty Borin: Duncan Dull Treating Iveth Heidemann/Extender: Linwood Dibbles, HOYT Weeks in Treatment: 0 Allergies Active Allergies No Known Drug Allergies Allergy Notes Electronic Signature(s) Signed: 06/17/2020 4:32:50 PM By: Elliot Gurney, BSN, RN, CWS, Kim RN, BSN Entered By: Elliot Gurney, BSN, RN, CWS, Kim on 06/17/2020 13:07:34 [name] ([id number]) -------------------------------------------------------------------------------- Arrival Information Details Patient Name: Victor Daniels. Date of Service: 06/17/2020 12:45 PM Medical Record Number: [id number] Patient Account Number: [id number] Date of Birth/Sex: May 12, 1946 (74 y.o. M) Treating RN: Rodell Perna Primary Care Jaxx Huish: Duncan Dull Other Clinician: Referring Tania Steinhauser: Duncan Dull Treating Sia Gabrielsen/Extender: Linwood Dibbles, HOYT Weeks in Treatment: 0 Visit Information Patient Arrived: Wheel Chair Arrival Time: 12:55 Accompanied By: wife Transfer Assistance: None Patient Identification Verified: Yes Secondary Verification Process Completed: Yes Patient Has Alerts: Yes Patient Alerts: Type II Diabetic History Since Last Visit Added or deleted any medications: No Any new allergies or adverse reactions: No Had a fall or experienced change in activities of daily living that may affect risk of falls: No Signs or symptoms of abuse/neglect since last visito No Hospitalized since last visit: No Implantable device outside of the clinic excluding cellular tissue based products placed in the center since last visit: No Has Dressing in Place as Prescribed: No Electronic  Signature(s) Signed: 06/18/2020 10:45:36 AM By: Elliot Gurney, BSN, RN, CWS, Kim RN, BSN Previous Signature: 06/17/2020 4:23:12 PM Version By: Dayton Martes RCP, RRT, CHT Entered By: Elliot Gurney, BSN, RN, CWS, Kim on 06/18/2020 10:45:36 [name] ([id number]) -------------------------------------------------------------------------------- Clinic Level of Care Assessment Details Patient Name: Victor Daniels. Date of Service: 06/17/2020 12:45 PM Medical Record Number: [id number] Patient Account Number: [id number] Date of Birth/Sex: 04-02-1946 (74 y.o. M) Treating RN: Huel Coventry Primary Care Granvel Proudfoot: Duncan Dull Other Clinician: Referring Jayon Matton: Duncan Dull Treating Izaya Netherton/Extender: Linwood Dibbles, HOYT Weeks in Treatment: 0 Clinic Level of Care Assessment Items TOOL 2 Quantity Score []  - Use when only an EandM is performed on the INITIAL visit 0 ASSESSMENTS - Nursing Assessment / Reassessment X - General Physical Exam (combine w/ comprehensive assessment (listed just below) when performed on new 1 20 pt. evals) X- 1 25 Comprehensive Assessment (HX, ROS, Risk Assessments, Wounds Hx, etc.) ASSESSMENTS - Wound and Skin Assessment / Reassessment []  - Simple Wound Assessment / Reassessment - one wound 0 []  - 0 Complex Wound Assessment / Reassessment - multiple wounds X- 1 10 Dermatologic / Skin Assessment (not related to wound area) ASSESSMENTS - Ostomy and/or Continence Assessment and Care []  - Incontinence Assessment and Management 0 []  - 0 Ostomy Care Assessment and Management (repouching, etc.) PROCESS - Coordination of Care []  - Simple Patient / Family Education for ongoing care 0 X- 1 20 Complex (extensive) Patient / Family Education for ongoing care X- 1 10 Staff obtains , Records, Test Results / Process Orders []  - 0 Staff telephones HHA, Nursing Homes / Clarify orders / etc []  - 0 Routine Transfer to another Facility (non-emergent  condition) []  - 0 Routine Hospital Admission (non-emergent condition) X- 1 15 New Admissions / / Ordering NPWT, Apligraf, etc. []  - 0 Emergency Hospital Admission (emergent condition) X- 1 10 Simple Discharge Coordination []  - 0 Complex (extensive) Discharge Coordination PROCESS - Special Needs []  -  Pediatric / Minor Patient Management 0 []  - 0 Isolation Patient Management []  - 0 Hearing / Language / Visual special needs []  - 0 Assessment of Community assistance (transportation, D/C planning, etc.) []  - 0 Additional assistance / Altered mentation []  - 0 Support Surface(s) Assessment (bed, cushion, seat, etc.) INTERVENTIONS - Wound Cleansing / Measurement []  - Wound Imaging (photographs - any number of wounds) 0 []  - 0 Wound Tracing (instead of photographs) []  - 0 Simple Wound Measurement - one wound []  - 0 Complex Wound Measurement - multiple wounds ODAY, RIDINGS ([id number]) []  - 0 Simple Wound Cleansing - one wound []  - 0 Complex Wound Cleansing - multiple wounds INTERVENTIONS - Wound Dressings X - Small Wound Dressing one or multiple wounds 2 10 []  - 0 Medium Wound Dressing one or multiple wounds []  - 0 Large Wound Dressing one or multiple wounds []  - 0 Application of Medications - injection INTERVENTIONS - Miscellaneous []  - External ear exam 0 []  - 0 Specimen Collection (cultures, biopsies, blood, body fluids, etc.) []  - 0 Specimen(s) / Culture(s) sent or taken to Lab for analysis []  - 0 Patient Transfer (multiple staff / Civil Service fast streamer / Similar devices) []  - 0 Simple Staple / Suture removal (25 or less) []  - 0 Complex Staple / Suture removal (26 or more) []  - 0 Hypo / Hyperglycemic Management (close monitor of Blood Glucose) []  - 0 Ankle / Brachial Index (ABI) - do not check if billed separately Has the patient been seen at the hospital within the last three years: Yes Total Score: 130 Level Of Care: New/Established  - Level 4 Electronic Signature(s) Signed: 06/17/2020 4:32:50 PM By: Gretta Cool, BSN, RN, CWS, Kim RN, BSN Entered By: Gretta Cool, BSN, RN, CWS, Kim on 06/17/2020 13:28:11 Edson Snowball ([id number]) -------------------------------------------------------------------------------- Encounter Discharge Information Details Patient Name: ALBERTO, PINA. Date of Service: 06/17/2020 12:45 PM Medical Record Number: [id number] Patient Account Number: [id number] Date of Birth/Sex: September 14, 1946 (74 y.o. M) Treating RN: Cornell Barman Primary Care Herlinda Heady: Deborra Medina Other Clinician: Referring Zayvian Mcmurtry: Deborra Medina Treating Siah Steely/Extender: Melburn Hake, HOYT Weeks in Treatment: 0 Encounter Discharge Information Items Discharge Condition: Stable Ambulatory Status: Wheelchair Discharge Destination: Home Transportation: Private Auto Accompanied By: spouse Schedule Follow-up Appointment: Yes Clinical Summary of Care: Electronic Signature(s) Signed: 06/17/2020 4:32:50 PM By: Gretta Cool, BSN, RN, CWS, Kim RN, BSN Entered By: Gretta Cool, BSN, RN, CWS, Kim on 06/17/2020 13:34:09 Edson Snowball ([id number]) -------------------------------------------------------------------------------- Lower Extremity Assessment Details Patient Name: MARICUS, TANZI. Date of Service: 06/17/2020 12:45 PM Medical Record Number: [id number] Patient Account Number: [id number] Date of Birth/Sex: [birth year] (74 y.o. M) Treating RN: Cornell Barman Primary Care Swara Donze: Deborra Medina Other Clinician: Referring Ricardo Kayes: Deborra Medina Treating Veola Cafaro/Extender: Melburn Hake, HOYT Weeks in Treatment: 0 Edema Assessment Assessed: [Left: No] [Right: No] [Left: Edema] [Right: :] Calf Left: Right: Point of Measurement: 35 cm From Medial Instep 51.5 cm 55 cm Ankle Left: Right: Point of Measurement: 13 cm From Medial Instep 30 cm 29 cm Vascular Assessment Pulses: Dorsalis Pedis Palpable: [Left:No] [Right:No] Doppler  Audible: [Left:Yes] [Right:Yes] Posterior Tibial Palpable: [Left:No Yes] [Right:No Yes] Electronic Signature(s) Signed: 06/17/2020 4:32:50 PM By: Gretta Cool, BSN, RN, CWS, Kim RN, BSN Entered By: Gretta Cool, BSN, RN, CWS, Kim on 06/17/2020 13:19:32 Edson Snowball ([id number]) -------------------------------------------------------------------------------- [place] Details Patient Name: MILFORD, CILENTO. Date of Service: 06/17/2020 12:45 PM Medical Record Number: [id number] Patient Account Number: [id number] Date of Birth/Sex: 1946-10-25 (74 y.o. M) Treating RN: Cornell Barman Primary Care Shomari Matusik: Derrel Nip,  Rosey Bath Other Clinician: Referring [place]: Duncan Dull Treating Zaron Zwiefelhofer/Extender: Linwood Dibbles, HOYT Weeks in Treatment: 0 Active Inactive Orientation to the Wound Care Program Nursing Diagnoses: Knowledge deficit related to the wound healing center program Goals: Patient/caregiver will verbalize understanding of the Wound Healing Center Program Date Initiated: 06/17/2020 Target Resolution Date: 06/24/2020 Goal Status: Active Interventions: Provide education on orientation to the wound center Notes: Venous Leg Ulcer Nursing Diagnoses: Knowledge deficit related to disease process and management Potential for venous Insuffiency (use before diagnosis confirmed) Goals: Patient will maintain optimal edema control Date Initiated: 06/17/2020 Target Resolution Date: 06/24/2020 Goal Status: Active Verify adequate tissue perfusion prior to therapeutic compression application Date Initiated: 06/17/2020 Target Resolution Date: 06/24/2020 Goal Status: Active Interventions: Assess peripheral edema status every visit. Treatment Activities: Non-invasive vascular studies : 06/17/2020 Venous Duplex Doppler : 06/17/2020 Notes: Wound/Skin Impairment Nursing Diagnoses: Impaired tissue integrity Goals: Patient/caregiver will verbalize understanding of skin care regimen Date  Initiated: 06/17/2020 Target Resolution Date: 06/24/2020 Goal Status: Active Interventions: Assess patient/caregiver ability to perform ulcer/skin care regimen upon admission and as needed Notes: Electronic Signature(s) UCHENNA, [place] ([id number]) Signed: 06/17/2020 4:32:50 PM By: Elliot Gurney, BSN, RN, CWS, Kim RN, BSN Entered By: Elliot Gurney, BSN, RN, CWS, Kim on 06/17/2020 13:21:39 [name] ([id number]) -------------------------------------------------------------------------------- Non-Wound Condition Assessment Details Patient Name: CAPTAIN, BLUCHER. Date of Service: 06/17/2020 12:45 PM Medical Record Number: [id number] Patient Account Number: [id number] Date of Birth/Sex: 02-21-1946 (74 y.o. M) Treating RN: Huel Coventry Primary Care Nesbit Michon: Duncan Dull Other Clinician: Referring Arthur Speagle: Duncan Dull Treating Sheresa Cullop/Extender: Linwood Dibbles, HOYT Weeks in Treatment: 0 Non-Wound Condition: Condition: Lymphedema Location: Leg Side: Bilateral Photos Electronic Signature(s) Signed: 06/17/2020 4:32:50 PM By: Elliot Gurney, BSN, RN, CWS, Kim RN, BSN Entered By: Elliot Gurney, BSN, RN, CWS, Kim on 06/17/2020 13:24:14 [name] ([id number]) -------------------------------------------------------------------------------- Pain Assessment Details Patient Name: KENNIS, WISSMANN. Date of Service: 06/17/2020 12:45 PM Medical Record Number: [id number] Patient Account Number: [id number] Date of Birth/Sex: 07/15/1946 (74 y.o. M) Treating RN: Rodell Perna Primary Care Ruthell Feigenbaum: Duncan Dull Other Clinician: Referring Coltin Casher: Duncan Dull Treating Omara Alcon/Extender: Linwood Dibbles, HOYT Weeks in Treatment: 0 Active Problems Location of Pain Severity and Description of Pain Patient Has Paino No Site Locations Pain Management and Medication Current Pain Management: Electronic Signature(s) Signed: 06/17/2020 3:50:50 PM By: Rodell Perna Signed: 06/17/2020 4:23:12 PM By:  Dayton Martes RCP, RRT, CHT Entered By: Dayton Martes on 06/17/2020 12:59:17 [name] ([id number]) -------------------------------------------------------------------------------- Patient/Caregiver Education Details Patient Name: MANLEY, FASON. Date of Service: 06/17/2020 12:45 PM Medical Record Number: [id number] Patient Account Number: [id number] Date of Birth/Gender: 1946/07/06 (74 y.o. M) Treating RN: Huel Coventry Primary Care Physician: Duncan Dull Other Clinician: Referring Physician: Duncan Dull Treating Physician/Extender: Linwood Dibbles, HOYT Weeks in Treatment: 0 Education Assessment Education Provided To: Patient Education Topics Provided Welcome To The Wound Care Center: Handouts: Welcome To The Wound Care Center Methods: Demonstration, Explain/Verbal Responses: State content correctly Wound/Skin Impairment: Handouts: Caring for Your Ulcer Methods: Demonstration, Explain/Verbal Responses: State content correctly Electronic Signature(s) Signed: 06/17/2020 4:32:50 PM By: Elliot Gurney, BSN, RN, CWS, Kim RN, BSN Entered By: Elliot Gurney, BSN, RN, CWS, Kim on 06/17/2020 13:32:59 [name] ([id number]) -------------------------------------------------------------------------------- Vitals Details Patient Name: [name]. Date of Service: 06/17/2020 12:45 PM Medical Record Number: [id number] Patient Account Number: [id number] Date of Birth/Sex: [birth year] (74 y.o. M) Treating RN: Rodell Perna Primary Care Keriana Sarsfield: Duncan Dull Other Clinician: Referring Sherby Moncayo: Duncan Dull Treating Maddison Kilner/Extender: STONE III, HOYT Weeks in Treatment: 0 Vital Signs Time  Taken: 13:00 Temperature (F): 97.5 Height (in): 72 Pulse (bpm): 62 Source: Stated Respiratory Rate (breaths/min): 20 Weight (lbs): 297 Blood Pressure (mmHg): 101/47 Source: Stated Reference Range: 80 - 120 mg / dl Body Mass Index (BMI):  40.3 Electronic Signature(s) Signed: 06/17/2020 4:23:12 PM By: Dayton Martes RCP, RRT, CHT Entered By: Dayton Martes on 06/17/2020 13:04:44

## 2020-06-21 ENCOUNTER — Inpatient Hospital Stay
Admission: EM | Admit: 2020-06-21 | Discharge: 2020-06-25 | DRG: 291 | Disposition: A | Discharge status: Home Health | Payer: Medicare HMO | Attending: Internal Medicine | Admitting: Internal Medicine

## 2020-06-21 ENCOUNTER — Encounter: Payer: Self-pay | Admitting: Intensive Care

## 2020-06-21 ENCOUNTER — Emergency Department: Payer: Medicare HMO

## 2020-06-21 ENCOUNTER — Other Ambulatory Visit: Payer: Self-pay

## 2020-06-21 ENCOUNTER — Telehealth: Payer: Self-pay | Admitting: Internal Medicine

## 2020-06-21 DIAGNOSIS — R0902 Hypoxemia: Secondary | ICD-10-CM

## 2020-06-21 DIAGNOSIS — E1122 Type 2 diabetes mellitus with diabetic chronic kidney disease: Secondary | ICD-10-CM | POA: Diagnosis present

## 2020-06-21 DIAGNOSIS — Z96649 Presence of unspecified artificial hip joint: Secondary | ICD-10-CM | POA: Diagnosis present

## 2020-06-21 DIAGNOSIS — I255 Ischemic cardiomyopathy: Secondary | ICD-10-CM | POA: Diagnosis present

## 2020-06-21 DIAGNOSIS — Z20822 Contact with and (suspected) exposure to covid-19: Secondary | ICD-10-CM | POA: Diagnosis not present

## 2020-06-21 DIAGNOSIS — I130 Hypertensive heart and chronic kidney disease with heart failure and stage 1 through stage 4 chronic kidney disease, or unspecified chronic kidney disease: Secondary | ICD-10-CM | POA: Diagnosis not present

## 2020-06-21 DIAGNOSIS — R600 Localized edema: Secondary | ICD-10-CM | POA: Diagnosis not present

## 2020-06-21 DIAGNOSIS — Z6841 Body Mass Index (BMI) 40.0 and over, adult: Secondary | ICD-10-CM

## 2020-06-21 DIAGNOSIS — E876 Hypokalemia: Secondary | ICD-10-CM | POA: Diagnosis not present

## 2020-06-21 DIAGNOSIS — Z86718 Personal history of other venous thrombosis and embolism: Secondary | ICD-10-CM

## 2020-06-21 DIAGNOSIS — I4819 Other persistent atrial fibrillation: Secondary | ICD-10-CM | POA: Diagnosis present

## 2020-06-21 DIAGNOSIS — D696 Thrombocytopenia, unspecified: Secondary | ICD-10-CM | POA: Diagnosis present

## 2020-06-21 DIAGNOSIS — E1151 Type 2 diabetes mellitus with diabetic peripheral angiopathy without gangrene: Secondary | ICD-10-CM | POA: Diagnosis present

## 2020-06-21 DIAGNOSIS — I4891 Unspecified atrial fibrillation: Secondary | ICD-10-CM | POA: Diagnosis not present

## 2020-06-21 DIAGNOSIS — J90 Pleural effusion, not elsewhere classified: Secondary | ICD-10-CM | POA: Diagnosis not present

## 2020-06-21 DIAGNOSIS — G4733 Obstructive sleep apnea (adult) (pediatric): Secondary | ICD-10-CM | POA: Diagnosis not present

## 2020-06-21 DIAGNOSIS — E785 Hyperlipidemia, unspecified: Secondary | ICD-10-CM | POA: Diagnosis present

## 2020-06-21 DIAGNOSIS — I509 Heart failure, unspecified: Secondary | ICD-10-CM | POA: Diagnosis not present

## 2020-06-21 DIAGNOSIS — I872 Venous insufficiency (chronic) (peripheral): Secondary | ICD-10-CM | POA: Diagnosis present

## 2020-06-21 DIAGNOSIS — I110 Hypertensive heart disease with heart failure: Secondary | ICD-10-CM | POA: Diagnosis not present

## 2020-06-21 DIAGNOSIS — I878 Other specified disorders of veins: Secondary | ICD-10-CM | POA: Diagnosis present

## 2020-06-21 DIAGNOSIS — Z8249 Family history of ischemic heart disease and other diseases of the circulatory system: Secondary | ICD-10-CM

## 2020-06-21 DIAGNOSIS — J9621 Acute and chronic respiratory failure with hypoxia: Secondary | ICD-10-CM | POA: Diagnosis not present

## 2020-06-21 DIAGNOSIS — N1831 Chronic kidney disease, stage 3a: Secondary | ICD-10-CM | POA: Diagnosis present

## 2020-06-21 DIAGNOSIS — J441 Chronic obstructive pulmonary disease with (acute) exacerbation: Secondary | ICD-10-CM | POA: Diagnosis present

## 2020-06-21 DIAGNOSIS — R609 Edema, unspecified: Secondary | ICD-10-CM | POA: Diagnosis not present

## 2020-06-21 DIAGNOSIS — Z8673 Personal history of transient ischemic attack (TIA), and cerebral infarction without residual deficits: Secondary | ICD-10-CM

## 2020-06-21 DIAGNOSIS — Z9842 Cataract extraction status, left eye: Secondary | ICD-10-CM

## 2020-06-21 DIAGNOSIS — Z87891 Personal history of nicotine dependence: Secondary | ICD-10-CM

## 2020-06-21 DIAGNOSIS — I5033 Acute on chronic diastolic (congestive) heart failure: Secondary | ICD-10-CM | POA: Diagnosis not present

## 2020-06-21 DIAGNOSIS — I2510 Atherosclerotic heart disease of native coronary artery without angina pectoris: Secondary | ICD-10-CM | POA: Diagnosis present

## 2020-06-21 DIAGNOSIS — Z9119 Patient's noncompliance with other medical treatment and regimen: Secondary | ICD-10-CM

## 2020-06-21 DIAGNOSIS — J189 Pneumonia, unspecified organism: Secondary | ICD-10-CM | POA: Diagnosis not present

## 2020-06-21 DIAGNOSIS — I5031 Acute diastolic (congestive) heart failure: Secondary | ICD-10-CM | POA: Diagnosis not present

## 2020-06-21 DIAGNOSIS — Z79899 Other long term (current) drug therapy: Secondary | ICD-10-CM

## 2020-06-21 DIAGNOSIS — Z905 Acquired absence of kidney: Secondary | ICD-10-CM

## 2020-06-21 DIAGNOSIS — E669 Obesity, unspecified: Secondary | ICD-10-CM | POA: Diagnosis present

## 2020-06-21 DIAGNOSIS — I10 Essential (primary) hypertension: Secondary | ICD-10-CM | POA: Diagnosis not present

## 2020-06-21 DIAGNOSIS — Z961 Presence of intraocular lens: Secondary | ICD-10-CM | POA: Diagnosis present

## 2020-06-21 DIAGNOSIS — D631 Anemia in chronic kidney disease: Secondary | ICD-10-CM | POA: Diagnosis present

## 2020-06-21 DIAGNOSIS — R531 Weakness: Secondary | ICD-10-CM | POA: Diagnosis not present

## 2020-06-21 DIAGNOSIS — J811 Chronic pulmonary edema: Secondary | ICD-10-CM | POA: Diagnosis not present

## 2020-06-21 DIAGNOSIS — R069 Unspecified abnormalities of breathing: Secondary | ICD-10-CM | POA: Diagnosis not present

## 2020-06-21 DIAGNOSIS — R0602 Shortness of breath: Secondary | ICD-10-CM | POA: Diagnosis not present

## 2020-06-21 DIAGNOSIS — N179 Acute kidney failure, unspecified: Secondary | ICD-10-CM | POA: Diagnosis not present

## 2020-06-21 DIAGNOSIS — N183 Chronic kidney disease, stage 3 unspecified: Secondary | ICD-10-CM | POA: Diagnosis not present

## 2020-06-21 DIAGNOSIS — J80 Acute respiratory distress syndrome: Secondary | ICD-10-CM | POA: Diagnosis not present

## 2020-06-21 DIAGNOSIS — D638 Anemia in other chronic diseases classified elsewhere: Secondary | ICD-10-CM | POA: Diagnosis not present

## 2020-06-21 DIAGNOSIS — J440 Chronic obstructive pulmonary disease with acute lower respiratory infection: Secondary | ICD-10-CM | POA: Diagnosis not present

## 2020-06-21 DIAGNOSIS — I252 Old myocardial infarction: Secondary | ICD-10-CM

## 2020-06-21 DIAGNOSIS — I517 Cardiomegaly: Secondary | ICD-10-CM | POA: Diagnosis not present

## 2020-06-21 DIAGNOSIS — E1159 Type 2 diabetes mellitus with other circulatory complications: Secondary | ICD-10-CM

## 2020-06-21 DIAGNOSIS — R0689 Other abnormalities of breathing: Secondary | ICD-10-CM | POA: Diagnosis not present

## 2020-06-21 DIAGNOSIS — Z9981 Dependence on supplemental oxygen: Secondary | ICD-10-CM

## 2020-06-21 LAB — COMPREHENSIVE METABOLIC PANEL
ALT: 17 U/L (ref 0–44)
AST: 19 U/L (ref 15–41)
Albumin: 3.1 g/dL — ABNORMAL LOW (ref 3.5–5.0)
Alkaline Phosphatase: 85 U/L (ref 38–126)
Anion gap: 15 (ref 5–15)
BUN: 27 mg/dL — ABNORMAL HIGH (ref 8–23)
CO2: 36 mmol/L — ABNORMAL HIGH (ref 22–32)
Calcium: 9.2 mg/dL (ref 8.9–10.3)
Chloride: 92 mmol/L — ABNORMAL LOW (ref 98–111)
Creatinine, Ser: 1.41 mg/dL — ABNORMAL HIGH (ref 0.61–1.24)
GFR calc Af Amer: 57 mL/min — ABNORMAL LOW (ref >60)
GFR calc non Af Amer: 49 mL/min — ABNORMAL LOW (ref >60)
Glucose, Bld: 144 mg/dL — ABNORMAL HIGH (ref 70–99)
Potassium: 3.6 mmol/L (ref 3.5–5.1)
Sodium: 143 mmol/L (ref 135–145)
Total Bilirubin: 1.1 mg/dL (ref 0.3–1.2)
Total Protein: 7.2 g/dL (ref 6.5–8.1)

## 2020-06-21 LAB — BLOOD GAS, VENOUS
Acid-Base Excess: 17.8 mmol/L — ABNORMAL HIGH (ref 0.0–2.0)
Bicarbonate: 46.1 mmol/L — ABNORMAL HIGH (ref 20.0–28.0)
O2 Saturation: 91.1 %
Patient temperature: 37
pCO2, Ven: 71 mmHg (ref 44.0–60.0)
pH, Ven: 7.42 (ref 7.250–7.430)
pO2, Ven: 60 mmHg — ABNORMAL HIGH (ref 32.0–45.0)

## 2020-06-21 LAB — CBC WITH DIFFERENTIAL/PLATELET
Abs Immature Granulocytes: 0.05 10*3/uL (ref 0.00–0.07)
Basophils Absolute: 0.1 10*3/uL (ref 0.0–0.1)
Basophils Relative: 1 %
Eosinophils Absolute: 0.1 10*3/uL (ref 0.0–0.5)
Eosinophils Relative: 1 %
HCT: 33 % — ABNORMAL LOW (ref 39.0–52.0)
Hemoglobin: 9.9 g/dL — ABNORMAL LOW (ref 13.0–17.0)
Immature Granulocytes: 0 %
Lymphocytes Relative: 14 %
Lymphs Abs: 1.5 10*3/uL (ref 0.7–4.0)
MCH: 27.2 pg (ref 26.0–34.0)
MCHC: 30 g/dL (ref 30.0–36.0)
MCV: 90.7 fL (ref 80.0–100.0)
Monocytes Absolute: 0.9 10*3/uL (ref 0.1–1.0)
Monocytes Relative: 8 %
Neutro Abs: 8.6 10*3/uL — ABNORMAL HIGH (ref 1.7–7.7)
Neutrophils Relative %: 76 %
Platelets: 158 10*3/uL (ref 150–400)
RBC: 3.64 MIL/uL — ABNORMAL LOW (ref 4.22–5.81)
RDW: 16.7 % — ABNORMAL HIGH (ref 11.5–15.5)
WBC: 11.2 10*3/uL — ABNORMAL HIGH (ref 4.0–10.5)
nRBC: 0 % (ref 0.0–0.2)

## 2020-06-21 LAB — URINALYSIS, COMPLETE (UACMP) WITH MICROSCOPIC
Bacteria, UA: NONE SEEN
Bilirubin Urine: NEGATIVE
Glucose, UA: NEGATIVE mg/dL
Hgb urine dipstick: NEGATIVE
Ketones, ur: NEGATIVE mg/dL
Leukocytes,Ua: NEGATIVE
Nitrite: NEGATIVE
Protein, ur: NEGATIVE mg/dL
Specific Gravity, Urine: 1.004 — ABNORMAL LOW (ref 1.005–1.030)
Squamous Epithelial / HPF: NONE SEEN (ref 0–5)
pH: 7 (ref 5.0–8.0)

## 2020-06-21 LAB — PROCALCITONIN: Procalcitonin: <0.1 ng/mL

## 2020-06-21 LAB — TROPONIN I (HIGH SENSITIVITY)
Troponin I (High Sensitivity): 16 ng/L (ref <18)
Troponin I (High Sensitivity): 16 ng/L (ref <18)

## 2020-06-21 LAB — BRAIN NATRIURETIC PEPTIDE: B Natriuretic Peptide: 488.4 pg/mL — ABNORMAL HIGH (ref 0.0–100.0)

## 2020-06-21 LAB — LACTIC ACID, PLASMA: Lactic Acid, Venous: 0.9 mmol/L (ref 0.5–1.9)

## 2020-06-21 LAB — SARS CORONAVIRUS 2 BY RT PCR (HOSPITAL ORDER, PERFORMED IN ~~LOC~~ HOSPITAL LAB): SARS Coronavirus 2: NEGATIVE

## 2020-06-21 MED ORDER — SODIUM CHLORIDE 0.9 % IV SOLN
1.0000 g | Freq: Once | INTRAVENOUS | Status: AC
  Administered 2020-06-21: 1 g via INTRAVENOUS
  Filled 2020-06-21: qty 1

## 2020-06-21 MED ORDER — FLUTICASONE-UMECLIDIN-VILANT 100-62.5-25 MCG/INH IN AEPB: 1.0000 | INHALATION_SPRAY | Freq: Every day | RESPIRATORY_TRACT | Status: DC

## 2020-06-21 MED ORDER — SODIUM CHLORIDE 0.9% FLUSH
3.0000 mL | Freq: Two times a day (BID) | INTRAVENOUS | Status: DC
  Administered 2020-06-21 – 2020-06-25 (×8): 3 mL via INTRAVENOUS

## 2020-06-21 MED ORDER — SODIUM CHLORIDE 0.9% FLUSH: 3.0000 mL | INTRAVENOUS | Status: DC | PRN

## 2020-06-21 MED ORDER — FERROUS FUM-IRON POLYSACCH 162-115.2 MG PO CAPS: 1.0000 | ORAL_CAPSULE | Freq: Every day | ORAL | Status: DC

## 2020-06-21 MED ORDER — ACETAMINOPHEN 325 MG PO TABS: 650.0000 mg | ORAL_TABLET | ORAL | Status: DC | PRN

## 2020-06-21 MED ORDER — IPRATROPIUM-ALBUTEROL 0.5-2.5 (3) MG/3ML IN SOLN
3.0000 mL | Freq: Four times a day (QID) | RESPIRATORY_TRACT | Status: DC | PRN
  Administered 2020-06-21 – 2020-06-24 (×3): 3 mL via RESPIRATORY_TRACT
  Filled 2020-06-21 (×3): qty 3

## 2020-06-21 MED ORDER — AMLODIPINE BESYLATE 5 MG PO TABS
5.0000 mg | ORAL_TABLET | Freq: Every day | ORAL | Status: DC
  Administered 2020-06-21 – 2020-06-23 (×3): 5 mg via ORAL
  Filled 2020-06-21 (×3): qty 1

## 2020-06-21 MED ORDER — ENOXAPARIN SODIUM 40 MG/0.4ML ~~LOC~~ SOLN: 40.0000 mg | SUBCUTANEOUS | Status: DC

## 2020-06-21 MED ORDER — LOSARTAN POTASSIUM 50 MG PO TABS
100.0000 mg | ORAL_TABLET | Freq: Every day | ORAL | Status: DC
  Administered 2020-06-21 – 2020-06-22 (×2): 100 mg via ORAL
  Filled 2020-06-21 (×2): qty 2

## 2020-06-21 MED ORDER — VITAMIN B-12 1000 MCG PO TABS
1000.0000 ug | ORAL_TABLET | Freq: Every day | ORAL | Status: DC
  Administered 2020-06-21 – 2020-06-25 (×5): 1000 ug via ORAL
  Filled 2020-06-21 (×5): qty 1

## 2020-06-21 MED ORDER — SIMVASTATIN 20 MG PO TABS
20.0000 mg | ORAL_TABLET | Freq: Every day | ORAL | Status: DC
  Administered 2020-06-21 – 2020-06-24 (×4): 20 mg via ORAL
  Filled 2020-06-21 (×5): qty 1

## 2020-06-21 MED ORDER — FUROSEMIDE 10 MG/ML IJ SOLN
40.0000 mg | Freq: Two times a day (BID) | INTRAMUSCULAR | Status: DC
  Administered 2020-06-21 – 2020-06-22 (×3): 40 mg via INTRAVENOUS
  Filled 2020-06-21 (×3): qty 4

## 2020-06-21 MED ORDER — IPRATROPIUM BROMIDE 0.02 % IN SOLN
0.5000 mg | Freq: Two times a day (BID) | RESPIRATORY_TRACT | Status: DC
  Administered 2020-06-21 – 2020-06-25 (×8): 0.5 mg via RESPIRATORY_TRACT
  Filled 2020-06-21 (×9): qty 2.5

## 2020-06-21 MED ORDER — IPRATROPIUM BROMIDE 0.03 % NA SOLN: 2.0000 | Freq: Two times a day (BID) | NASAL | Status: DC

## 2020-06-21 MED ORDER — FUROSEMIDE 10 MG/ML IJ SOLN
40.0000 mg | Freq: Once | INTRAMUSCULAR | Status: AC
  Administered 2020-06-21: 40 mg via INTRAVENOUS
  Filled 2020-06-21: qty 4

## 2020-06-21 MED ORDER — SODIUM CHLORIDE 0.9 % IV SOLN: 250.0000 mL | INTRAVENOUS | Status: DC | PRN

## 2020-06-21 MED ORDER — TAMSULOSIN HCL 0.4 MG PO CAPS
0.4000 mg | ORAL_CAPSULE | Freq: Every day | ORAL | Status: DC
  Administered 2020-06-21 – 2020-06-25 (×5): 0.4 mg via ORAL
  Filled 2020-06-21 (×5): qty 1

## 2020-06-21 MED ORDER — UMECLIDINIUM-VILANTEROL 62.5-25 MCG/INH IN AEPB
1.0000 | INHALATION_SPRAY | Freq: Every day | RESPIRATORY_TRACT | Status: DC
  Administered 2020-06-22 – 2020-06-25 (×4): 1 via RESPIRATORY_TRACT
  Filled 2020-06-21: qty 14

## 2020-06-21 MED ORDER — CARVEDILOL 12.5 MG PO TABS
12.5000 mg | ORAL_TABLET | Freq: Two times a day (BID) | ORAL | Status: DC
  Administered 2020-06-21 – 2020-06-22 (×2): 12.5 mg via ORAL
  Filled 2020-06-21 (×2): qty 1
  Filled 2020-06-21: qty 2

## 2020-06-21 MED ORDER — NITROGLYCERIN 0.4 MG SL SUBL: 0.4000 mg | SUBLINGUAL_TABLET | SUBLINGUAL | Status: DC | PRN

## 2020-06-21 MED ORDER — ENOXAPARIN SODIUM 40 MG/0.4ML ~~LOC~~ SOLN
40.0000 mg | Freq: Two times a day (BID) | SUBCUTANEOUS | Status: DC
  Administered 2020-06-21 – 2020-06-23 (×4): 40 mg via SUBCUTANEOUS
  Filled 2020-06-21 (×4): qty 0.4

## 2020-06-21 MED ORDER — ADULT MULTIVITAMIN W/MINERALS CH
1.0000 | ORAL_TABLET | Freq: Every day | ORAL | Status: DC
  Administered 2020-06-22 – 2020-06-25 (×4): 1 via ORAL
  Filled 2020-06-21 (×4): qty 1

## 2020-06-21 MED ORDER — MULTIVITAMINS PO CAPS: 1.0000 | ORAL_CAPSULE | Freq: Every day | ORAL | Status: DC

## 2020-06-21 MED ORDER — ONDANSETRON HCL 4 MG/2ML IJ SOLN: 4.0000 mg | Freq: Four times a day (QID) | INTRAMUSCULAR | Status: DC | PRN

## 2020-06-21 MED ORDER — IPRATROPIUM-ALBUTEROL 0.5-2.5 (3) MG/3ML IN SOLN
3.0000 mL | Freq: Once | RESPIRATORY_TRACT | Status: AC
  Administered 2020-06-21: 3 mL via RESPIRATORY_TRACT
  Filled 2020-06-21: qty 3

## 2020-06-21 NOTE — ED Notes (Signed)
Blankets adjusted for pt.

## 2020-06-21 NOTE — Telephone Encounter (Signed)
FYI
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
## 2020-06-21 NOTE — Progress Notes (Signed)
Anticoagulation monitoring(Lovenox):  73yo  M ordered Lovenox 40 mg Q24h  Filed Weights   06/21/20 0742  Weight: 133.8 kg (295 lb)   BMI 40   Lab Results  Component Value Date   CREATININE 1.41 (H) 06/21/2020   CREATININE 1.71 (H) 05/19/2020   CREATININE 1.67 (H) 04/15/2020   Estimated Creatinine Clearance: 66.1 mL/min (A) (by C-G formula based on SCr of 1.41 mg/dL (H)). Hemoglobin & Hematocrit     Component Value Date/Time   HGB 9.9 (L) 06/21/2020 0749   HGB 11.0 (L) 04/04/2015 0840   HCT 33.0 (L) 06/21/2020 0749   HCT 35.1 (L) 04/04/2015 0840     Per Protocol for Patient with estCrcl > 30 ml/min and BMI > 40, will transition to Lovenox 40 mg Q12h.      Bari Mantis PharmD Clinical Pharmacist 06/21/2020

## 2020-06-21 NOTE — Progress Notes (Signed)
°   06/21/20 2123  ReDS Vest / Clip  BMI (Calculated) 40.94  Reds vest not performed due to BMI

## 2020-06-21 NOTE — ED Notes (Signed)
Attempted report x1. 

## 2020-06-21 NOTE — Plan of Care (Signed)

## 2020-06-21 NOTE — ED Triage Notes (Signed)
Patient brought in by EMS from home for sob and weakness worsening the past couple of days. Patient unable to ambulate but baseline he can normally use walker but not the past couple of days. Patients o2 dropped while on 4L to 80s when getting on side of bed to urinate/change brief. A&O x4 during triage
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
## 2020-06-21 NOTE — Telephone Encounter (Signed)
Pt wanted to let Dr Darrick Huntsman know that he was admitted to [facility] today for shortness of breath and leg swelling. FYI

## 2020-06-21 NOTE — ED Notes (Signed)
Secretary ordering inpatient [facility] bed for pt's comfort.

## 2020-06-21 NOTE — ED Notes (Signed)
Pt given dinner tray.

## 2020-06-21 NOTE — ED Notes (Signed)
Attempted to give report x2. 2A charge RN not currently available to receive report either. ED charge RN Raquel notified.
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
## 2020-06-21 NOTE — ED Notes (Signed)
Pt switched from stretcher to [place] bed via standing and pivoting.

## 2020-06-21 NOTE — ED Notes (Signed)
Patient cleaned and brief changed 

## 2020-06-22 ENCOUNTER — Inpatient Hospital Stay (HOSPITAL_COMMUNITY)
Admit: 2020-06-22 | Discharge: 2020-06-22 | Disposition: A | Discharge status: Home / Self Care | Payer: Medicare HMO | Attending: Internal Medicine | Admitting: Internal Medicine

## 2020-06-22 DIAGNOSIS — I5033 Acute on chronic diastolic (congestive) heart failure: Secondary | ICD-10-CM

## 2020-06-22 DIAGNOSIS — I5031 Acute diastolic (congestive) heart failure: Secondary | ICD-10-CM

## 2020-06-22 LAB — BASIC METABOLIC PANEL
Anion gap: 12 (ref 5–15)
BUN: 32 mg/dL — ABNORMAL HIGH (ref 8–23)
CO2: 38 mmol/L — ABNORMAL HIGH (ref 22–32)
Calcium: 9 mg/dL (ref 8.9–10.3)
Chloride: 91 mmol/L — ABNORMAL LOW (ref 98–111)
Creatinine, Ser: 1.18 mg/dL (ref 0.61–1.24)
GFR calc Af Amer: >60 mL/min (ref >60)
GFR calc non Af Amer: >60 mL/min (ref >60)
Glucose, Bld: 150 mg/dL — ABNORMAL HIGH (ref 70–99)
Potassium: 3.7 mmol/L (ref 3.5–5.1)
Sodium: 141 mmol/L (ref 135–145)

## 2020-06-22 LAB — ECHOCARDIOGRAM COMPLETE
Height: 72 in
Weight: 4784 oz

## 2020-06-22 MED ORDER — POLYSACCHARIDE IRON COMPLEX 150 MG PO CAPS
150.0000 mg | ORAL_CAPSULE | Freq: Every day | ORAL | Status: DC
  Administered 2020-06-22 – 2020-06-25 (×4): 150 mg via ORAL
  Filled 2020-06-22 (×6): qty 1

## 2020-06-22 MED ORDER — FERROUS FUMARATE 324 (106 FE) MG PO TABS
1.0000 | ORAL_TABLET | Freq: Every day | ORAL | Status: DC
  Administered 2020-06-22 – 2020-06-25 (×4): 106 mg via ORAL
  Filled 2020-06-22 (×5): qty 1

## 2020-06-22 MED ORDER — CARVEDILOL 12.5 MG PO TABS
12.5000 mg | ORAL_TABLET | Freq: Two times a day (BID) | ORAL | Status: DC
  Administered 2020-06-22 – 2020-06-24 (×4): 12.5 mg via ORAL
  Filled 2020-06-22 (×5): qty 1

## 2020-06-22 MED ORDER — HYDROCERIN EX CREA
TOPICAL_CREAM | Freq: Every day | CUTANEOUS | Status: DC
  Filled 2020-06-22: qty 113

## 2020-06-22 MED ORDER — PERFLUTREN LIPID MICROSPHERE
1.0000 mL | INTRAVENOUS | Status: AC | PRN
  Administered 2020-06-22: 2 mL via INTRAVENOUS
  Filled 2020-06-22: qty 10

## 2020-06-22 NOTE — Progress Notes (Signed)
*  PRELIMINARY RESULTS* Echocardiogram 2D Echocardiogram has been performed.  Victor Daniels 06/22/2020, 10:46 AM

## 2020-06-22 NOTE — Progress Notes (Signed)
Per dr. Jerral Ralph give scheduled coreg 12.5mg  po. Current bp 106/70 heart rate 61. Per md will place parameters. Will continue to monitor

## 2020-06-22 NOTE — Evaluation (Signed)
Occupational Therapy Evaluation Patient Details Name: Victor Daniels MRN: [id number] DOB: 02-Aug-1946 Today's Date: 06/22/2020    History of Present Illness Victor Daniels is a 74 y.o. male with medical history significant for history of COPD, history of CHF, diet-controlled diabetes mellitus, chronic respiratory failure on 2 L of oxygen history of obstructive sleep apnea, lower extremity venous insufficiency who presents to the ER for evaluation of worsening shortness of breath for the past 2 days associated with orthopnea and bilateral lower extremity swelling   Clinical Impression   [name] was seen for OT evaluation this date. Pt was modified independent in all ADL and functional mobility PTA. He reports living with his "wife of 53 years" in a 1 story home with 1 small step to enter. Pt on 2 liters of O2 at home, but he reports increasing to 3-4 L on days his breathing is difficult. Pt reports becoming easily fatigued or out of breath with minimal exertion over the last few days. Pt currently requires minimal assistance for exertional ADL tasks including LB bathing and dressing due to current functional impairments (See OT Problem List below). Pt educated in energy conservation strategies including pursed lip breathing, activity pacing, home/routines modifications, work simplification, AE/DME, prioritizing of meaningful occupations, and falls prevention. Handout provided. Pt verbalized understanding and would benefit from additional skilled OT services to maximize recall and carryover of learned techniques and facilitate implementation of learned techniques into daily routines. Upon discharge, recommend HHOT services.       Follow Up Recommendations  Home health OT    Equipment Recommendations  3 in [facility])    Recommendations for Other Services       Precautions / Restrictions Precautions Precautions: Fall Restrictions Weight Bearing  Restrictions: No      Mobility Bed Mobility               General bed mobility comments: not assessed, patient up in recliner and remained in recliner  Transfers Overall transfer level: Needs assistance Equipment used: Rolling walker (2 wheeled) Transfers: Sit to/from Stand Sit to Stand: Supervision         General transfer comment: increased effort needed to stand from recliner; pt mildly impulsive attempting to stand with leg rest up, requires cues for safety and assist to lower leg rest for standing.    Balance Overall balance assessment: Needs assistance Sitting-balance support: Feet supported;No upper extremity supported Sitting balance-Leahy Scale: Good Sitting balance - Comments: Steady static stting, reaching within BOS.   Standing balance support: Bilateral upper extremity supported;During functional activity Standing balance-Leahy Scale: Fair Standing balance comment: Generally reliant on BUE support on RW with standing.                           ADL either performed or assessed with clinical judgement   ADL Overall ADL's : Needs assistance/impaired                                       General ADL Comments: Pt functionally limited by generalized weakness, cardiopulmonary status, poor activity tolerance, and BLE swelling/edema. Requires MIN A for LB ADL tasks including LB dressing and bathing in seated position. MAX cues for implementation of energy conservation strategies including pursed lip breathing during functional tasks.     Vision Baseline Vision/History: Wears glasses Patient Visual Report: No change  from baseline       Perception     Praxis      Pertinent Vitals/Pain Pain Assessment: No/denies pain     Hand Dominance Right   Extremity/Trunk Assessment Upper Extremity Assessment Upper Extremity Assessment: Generalized weakness   Lower Extremity Assessment Lower Extremity Assessment: Generalized weakness    Cervical / Trunk Assessment Cervical / Trunk Assessment: Normal   Communication Communication Communication: No difficulties   Cognition Arousal/Alertness: Awake/alert Behavior During Therapy: WFL for tasks assessed/performed Overall Cognitive Status: Within Functional Limits for tasks assessed                                     General Comments  HR & spO2 remain stable t/o session. Pt on 4 L [place]. HR 80-88, spO2 94 at rest. decreases to 90% with standing tasks, however this rebounds to 93% with cues for PLB.    Exercises Other Exercises Other Exercises: Pt educated on falls prevention strategies, safe use of AE/DME for ADL management, energy conservation techniques including 4 P's and pursed lip breathing during activity.   Shoulder Instructions      Home Living Family/patient expects to be discharged to:: Private residence Living Arrangements: Spouse/significant other Available Help at Discharge: Family;Available 24 hours/day Type of Home: House Home Access: Stairs to enter [employer] of Steps: 1 short step.   Home Layout: One level     Bathroom Shower/Tub: Walk-in shower;Door   [employer] Toilet: Standard     Home Equipment: Environmental consultant - 4 wheels;Cane - single point;Shower seat   Additional Comments: planning to get rollator      Prior Functioning/Environment Level of Independence: Independent with assistive device(s)  Gait / Transfers Assistance Needed: used cane ADL's / Homemaking Assistance Needed: wife assists as needed with dressing, and occasionally helps him stand up from his chair. 1 fall in past 6 months. +driving            OT Problem List: Decreased strength;Cardiopulmonary status limiting activity;Decreased activity tolerance;Decreased safety awareness;Increased edema;Impaired balance (sitting and/or standing);Decreased knowledge of use of DME or AE      OT Treatment/Interventions: Self-care/ADL training;Therapeutic  exercise;Therapeutic activities;DME and/or AE instruction;Patient/family education;Balance training;Energy conservation    OT Goals(Current goals can be found in the care plan section) Acute Rehab OT Goals Patient Stated Goal: to return home OT Goal Formulation: With patient Time For Goal Achievement: 07/06/20 Potential to Achieve Goals: Good ADL Goals Pt Will Perform Grooming: with set-up;with supervision;with caregiver independent in assisting;with adaptive equipment (c LRAD PRN for improved safety and fxl indep) Pt Will Perform Lower Body Dressing: sit to/from stand;with supervision;with set-up;with caregiver independent in assisting;with adaptive equipment (c LRAD PRN for improved safety and fxl indep) Pt Will Transfer to Toilet: bedside commode;with modified independence;ambulating (c LRAD PRN for improved safety and fxl indep; [facility]) Additional ADL Goal #1: Pt will independently verbalize a plan to implement at least 3 learned energy conservation strategies into their daily routines/home environment for improved safety and fxl indep. upon hospital DC.  OT Frequency: Min 1X/week   Barriers to D/C:            Co-evaluation              AM-PAC OT "6 Clicks" Daily Activity     Outcome Measure Help from another person eating meals?: None Help from another person taking care of personal grooming?: A Little Help from another person toileting,  which includes using toliet, bedpan, or urinal?: A Little Help from another person bathing (including washing, rinsing, drying)?: A Little Help from another person to put on and taking off regular upper body clothing?: A Little Help from another person to put on and taking off regular lower body clothing?: A Little 6 Click Score: 19   End of Session Equipment Utilized During Treatment: Rolling walker;Oxygen  Activity Tolerance: Patient tolerated treatment well Patient left: in chair;with chair alarm set;with call bell/phone within  reach  OT Visit Diagnosis: Other abnormalities of gait and mobility (R26.89);Muscle weakness (generalized) (M62.81)                Time: 0511-0211 OT Time Calculation (min): 33 min Charges:  OT General Charges $OT Visit: 1 Visit OT Evaluation $OT Eval Moderate Complexity: 1 Mod OT Treatments $Self Care/Home Management : 23-37 mins  Rockney Ghee, M.S., OTR/L Ascom: [phone] 06/22/20, 3:43 PM

## 2020-06-22 NOTE — Evaluation (Signed)
Physical Therapy Evaluation Patient Details Name: Victor Daniels MRN: [id number] DOB: 02-13-1946 Today's Date: 06/22/2020   History of Present Illness  Victor Daniels is a 74 y.o. male with medical history significant for history of COPD, history of CHF, diet-controlled diabetes mellitus, chronic respiratory failure on 2 L of oxygen history of obstructive sleep apnea, lower extremity venous insufficiency who presents to the ER for evaluation of worsening shortness of breath for the past 2 days associated with orthopnea and bilateral lower extremity swelling  Clinical Impression  Patient received in recliner with RN present in room. Reports he just tried to walk in the room independently, but the bed started alarming. Patient reports he is feeling much better. Performed sit to stand transfer with supervision. Increased effort to stand from low chair. He ambulated 40 feet with 4 liters. Definite sob with this distance. O2 sats down to 90%. Patient will continue to benefit from skilled PT while here to improve activity tolerance and safety.      Follow Up Recommendations Home health PT, RN    Equipment Recommendations  None recommended by PT    Recommendations for Other Services       Precautions / Restrictions Precautions Precautions: Fall Restrictions Weight Bearing Restrictions: No      Mobility  Bed Mobility               General bed mobility comments: not assessed, patient up in recliner and remained in recliner  Transfers Overall transfer level: Needs assistance Equipment used: Rolling walker (2 wheeled) Transfers: Sit to/from Stand Sit to Stand: Supervision         General transfer comment: increased effort needed to stand from recliner  Ambulation/Gait Ambulation/Gait assistance: Min guard Gait Distance (Feet): 40 Feet Assistive device: Rolling walker (2 wheeled) Gait Pattern/deviations: Step-through pattern;Wide base of support Gait velocity:  decr   General Gait Details: generally safe with RW and supervision. Decreased safety awareness. Prior to my arrival he was attempting to walk in the room without assistance.  Stairs            Wheelchair Mobility    Modified Rankin (Stroke Patients Only)       Balance Overall balance assessment: Needs assistance Sitting-balance support: Feet supported Sitting balance-Leahy Scale: Good     Standing balance support: Bilateral upper extremity supported;During functional activity Standing balance-Leahy Scale: Good Standing balance comment: reliant on B UE assist                             Pertinent Vitals/Pain Pain Assessment: No/denies pain    Home Living Family/patient expects to be discharged to:: Private residence Living Arrangements: Spouse/significant other Available Help at Discharge: Family;Available 24 hours/day Type of Home: House Home Access: Stairs to enter   [employer] of Steps: 1 Home Layout: One level Home Equipment: Walker - 4 wheels;Cane - single point Additional Comments: planning to get rollator    Prior Function Level of Independence: Independent with assistive device(s);Needs assistance   Gait / Transfers Assistance Needed: used cane  ADL's / Homemaking Assistance Needed: wife assists as needed        Hand Dominance        Extremity/Trunk Assessment   Upper Extremity Assessment Upper Extremity Assessment: Generalized weakness    Lower Extremity Assessment Lower Extremity Assessment: Generalized weakness    Cervical / Trunk Assessment Cervical / Trunk Assessment: Normal  Communication   Communication: No difficulties  Cognition  Arousal/Alertness: Awake/alert Behavior During Therapy: WFL for tasks assessed/performed Overall Cognitive Status: Within Functional Limits for tasks assessed                                        General Comments      Exercises     Assessment/Plan     PT Assessment Patient needs continued PT services  PT Problem List Decreased strength;Decreased mobility;Decreased activity tolerance;Decreased safety awareness;Decreased knowledge of precautions;Cardiopulmonary status limiting activity       PT Treatment Interventions DME instruction;Therapeutic activities;Gait training;Therapeutic exercise;Patient/family education;Balance training;Functional mobility training;Neuromuscular re-education    PT Goals (Current goals can be found in the Care Plan section)  Acute Rehab PT Goals Patient Stated Goal: to return home PT Goal Formulation: With patient Time For Goal Achievement: 07/06/20 Potential to Achieve Goals: Good    Frequency Min 2X/week   Barriers to discharge        Co-evaluation               AM-PAC PT "6 Clicks" Mobility  Outcome Measure Help needed turning from your back to your side while in a flat bed without using bedrails?: A Little Help needed moving from lying on your back to sitting on the side of a flat bed without using bedrails?: A Little Help needed moving to and from a bed to a chair (including a wheelchair)?: A Little Help needed standing up from a chair using your arms (e.g., wheelchair or bedside chair)?: A Little Help needed to walk in hospital room?: A Little Help needed climbing 3-5 steps with a railing? : A Lot 6 Click Score: 17    End of Session Equipment Utilized During Treatment: Gait belt;Oxygen Activity Tolerance: Patient limited by fatigue;Other (comment) (respiratory status.)   Nurse Communication: Mobility status PT Visit Diagnosis: Difficulty in walking, not elsewhere classified (R26.2);Muscle weakness (generalized) (M62.81)    Time: 1440-1500 PT Time Calculation (min) (ACUTE ONLY): 20 min   Charges:   PT Evaluation $PT Eval Moderate Complexity: 1 Mod PT Treatments $Gait Training: 8-22 mins        Victor Daniels, PT, GCS 06/22/20,3:14 PM

## 2020-06-22 NOTE — Progress Notes (Signed)
PROGRESS NOTE    Raynald Kempdward R Virden  ZOX:[id number] DOB: August 18, 1946 DOA: 06/21/2020 PCP: Sherlene Shamsullo, Teresa L, MD    Brief Narrative:  74 year old gentleman with extensive medical issues, history of COPD and chronic hypoxic failure on 2 L oxygen at home, history of diastolic heart failure, sleep apnea but does not like CPAP, diet-controlled diabetes, chronic lower extremity venous insufficiency presented to the emergency room with worsening shortness of breath for 2 days, orthopnea and worsening bilateral leg swellings.  He also has productive cough without fever. In the emergency room, chest x-ray shows cardiomegaly with prominent vasculature, interstitial edema.  Right pleural effusion.  Elevated BNP.  Troponin is normal.  Serum creatinine about normal.  EKG with no significant changes.   Assessment & Plan:   Principal Problem:   Acute on chronic diastolic CHF (congestive heart failure) (HCC) Active Problems:   Hypertension   Venous insufficiency (chronic) (peripheral)   Venous insufficiency   Obesity   Obstructive sleep apnea   CHF (congestive heart failure), NYHA class I, acute on chronic, diastolic (HCC)  Acute on chronic hypoxic respiratory failure: On 2 L oxygen at home.  This is multifactorial. Suspect component of acute diastolic failure, already responding to IV Lasix that we will continue.  Intake and output monitoring.  Daily weight.  We will continue IV Lasix today and subsequently changed to oral Lasix on discharge.  2D echocardiogram pending. Sleep apnea, patient was diagnosed with sleep apnea that he did not agree with so he does not wear CPAP.  We discussed in detail with his cardiovascular comorbidities and obesity, he will benefit with sleep apnea. Patient is on beta-blockers and losartan that he will continue. Start chest physiotherapy and flutter valve therapy to help with clearing secretions.  Leg edema/chronic venous stasis: Patient has chronic venous stasis and  leg swelling.  Local moisturizer and compression bandage.  DVT ruled out.  Diabetes mellitus with complications of a stage IIIa chronic kidney disease/traumatic removal of kidney, solitary kidney: Stable.  Sliding scale insulin.  Not on treatment at home.  Hypertension: Blood pressure is stable on amlodipine, Coreg and losartan.   DVT prophylaxis: enoxaparin (LOVENOX) injection 40 mg Start: 06/21/20 2200   Code Status: Full code Family Communication: Wife at the bedside Disposition Plan: Status is: Inpatient  Remains inpatient appropriate because:IV treatments appropriate due to intensity of illness or inability to take PO and Inpatient level of care appropriate due to severity of illness   Dispo: The patient is from: Home              Anticipated d/c is to: Home              Anticipated d/c date is: 2 days              Patient currently is not medically stable to d/c.         Consultants:   None  Procedures:   None  Antimicrobials:   None   Subjective: Patient seen and examined.  We discussed in detail about patient's diagnosis and possible benefits with sleep apnea. Patient has a scaly dry legs, he states that he could not use moisturizer cream because he could not reach to his legs.  Wife at the bedside. Complains of mucoid sputum production.  Objective: Vitals:   06/22/20 0508 06/22/20 0807 06/22/20 0819 06/22/20 1133  BP: 123/77 121/86  97/85  Pulse: 60 80  66  Resp: 20 19  18   Temp: 98 F (36.7 C) 97.6  F (36.4 C)  98 F (36.7 C)  TempSrc:  Oral  Oral  SpO2: 96% 97% 96% 97%  Weight: 135.6 kg     Height:        Intake/Output Summary (Last 24 hours) at 06/22/2020 1336 Last data filed at 06/22/2020 1134 Gross per 24 hour  Intake 840 ml  Output 2625 ml  Net -1785 ml   Filed Weights   06/21/20 0742 06/21/20 2123 06/22/20 0508  Weight: 133.8 kg (!) 136.9 kg 135.6 kg    Examination:  General exam: Appears chronically sick looking however  comfortable currently on 4 L oxygen. Respiratory system: Conducted upper airway sounds, audible bronchial sounds. Cardiovascular system: S1 & S2 heard, RRR.  No JVD. Gastrointestinal system: bowel sounds heard.  Obese and pendulous. Central nervous system: Alert and oriented. No focal neurological deficits. Extremities: Symmetric 5 x 5 power. Skin: Bilateral dry skin of the legs, nonpitting edema , no open wound. Psychiatry: Judgement and insight appear normal. Mood & affect appropriate.     Data Reviewed: I have personally reviewed following labs and imaging studies  CBC: Recent Labs  Lab 06/21/20 0749  WBC 11.2*  NEUTROABS 8.6*  HGB 9.9*  HCT 33.0*  MCV 90.7  PLT 158   Basic Metabolic Panel: Recent Labs  Lab 06/21/20 0749 06/22/20 0538  NA 143 141  K 3.6 3.7  CL 92* 91*  CO2 36* 38*  GLUCOSE 144* 150*  BUN 27* 32*  CREATININE 1.41* 1.18  CALCIUM 9.2 9.0   GFR: Estimated Creatinine Clearance: 79.5 mL/min (by C-G formula based on SCr of 1.18 mg/dL). Liver Function Tests: Recent Labs  Lab 06/21/20 0749  AST 19  ALT 17  ALKPHOS 85  BILITOT 1.1  PROT 7.2  ALBUMIN 3.1*   No results for input(s): LIPASE, AMYLASE in the last 168 hours. No results for input(s): AMMONIA in the last 168 hours. Coagulation Profile: No results for input(s): INR, PROTIME in the last 168 hours. Cardiac Enzymes: No results for input(s): CKTOTAL, CKMB, CKMBINDEX, TROPONINI in the last 168 hours. BNP (last 3 results) No results for input(s): PROBNP in the last 8760 hours. HbA1C: No results for input(s): HGBA1C in the last 72 hours. CBG: No results for input(s): GLUCAP in the last 168 hours. Lipid Profile: No results for input(s): CHOL, HDL, LDLCALC, TRIG, CHOLHDL, LDLDIRECT in the last 72 hours. Thyroid Function Tests: No results for input(s): TSH, T4TOTAL, FREET4, T3FREE, THYROIDAB in the last 72 hours. Anemia Panel: No results for input(s): VITAMINB12, FOLATE, FERRITIN, TIBC,  IRON, RETICCTPCT in the last 72 hours. Sepsis Labs: Recent Labs  Lab 06/21/20 0749 06/21/20 0830  PROCALCITON  --  <0.10  LATICACIDVEN 0.9  --     Recent Results (from the past 240 hour(s))  Culture, blood (routine x 2)     Status: None (Preliminary result)   Collection Time: 06/21/20  9:32 AM   Specimen: BLOOD  Result Value Ref Range Status   Specimen Description BLOOD LEFT ANTECUBITAL  Final   Special Requests   Final    BOTTLES DRAWN AEROBIC AND ANAEROBIC Blood Culture adequate volume   Culture   Final    NO GROWTH < 24 HOURS Performed at [facility], [address] [place], [place] [address]    Report Status PENDING  Incomplete  Culture, blood (routine x 2)     Status: None (Preliminary result)   Collection Time: 06/21/20  9:32 AM   Specimen: BLOOD  Result Value Ref Range Status  Specimen Description BLOOD LEFT ARM  Final   Special Requests   Final    BOTTLES DRAWN AEROBIC AND ANAEROBIC Blood Culture adequate volume   Culture   Final    NO GROWTH < 24 HOURS Performed at [facility], [address] [place], [place] [address]    Report Status PENDING  Incomplete  SARS Coronavirus 2 by RT PCR (hospital order, performed in [facility] hospital lab) Nasopharyngeal Urine, Clean Catch     Status: None   Collection Time: 06/21/20 10:22 AM   Specimen: Urine, Clean Catch; Nasopharyngeal  Result Value Ref Range Status   SARS Coronavirus 2 NEGATIVE NEGATIVE Final    Comment: (NOTE) SARS-CoV-2 target nucleic acids are NOT DETECTED.  The SARS-CoV-2 RNA is generally detectable in upper and lower respiratory specimens during the acute phase of infection. The lowest concentration of SARS-CoV-2 viral copies this assay can detect is 250 copies / mL. A negative result does not preclude SARS-CoV-2 infection and should not be used as the sole basis for treatment or other patient management decisions.  A negative result may occur with improper specimen  collection / handling, submission of specimen other than nasopharyngeal swab, presence of viral mutation(s) within the areas targeted by this assay, and inadequate number of viral copies (<250 copies / mL). A negative result must be combined with clinical observations, patient history, and epidemiological information.  Fact Sheet for Patients:   BoilerBrush.com.cy  Fact Sheet for Healthcare Providers: https://pope.com/  This test is not yet approved or  cleared by the [place] FDA and has been authorized for detection and/or diagnosis of SARS-CoV-2 by FDA under an Emergency Use Authorization (EUA).  This EUA will remain in effect (meaning this test can be used) for the duration of the COVID-19 declaration under Section 564(b)(1) of the Act, 21 U.S.C. section 360bbb-3(b)(1), unless the authorization is terminated or revoked sooner.  Performed at [facility], [address], [place], [place] [address]          Radiology Studies: US Venous Img Lower Bilateral (DVT)  Result Date: 06/21/2020 CLINICAL DATA:  Bilateral lower extremity pain and edema for several years. History of previous DVT. Evaluate for acute or chronic DVT. EXAM: BILATERAL LOWER EXTREMITY VENOUS DOPPLER ULTRASOUND TECHNIQUE: Gray-scale sonography with graded compression, as well as color Doppler and duplex ultrasound were performed to evaluate the lower extremity deep venous systems from the level of the common femoral vein and including the common femoral, femoral, profunda femoral, popliteal and calf veins including the posterior tibial, peroneal and gastrocnemius veins when visible. The superficial great saphenous vein was also interrogated. Spectral Doppler was utilized to evaluate flow at rest and with distal augmentation maneuvers in the common femoral, femoral and popliteal veins. COMPARISON:  None. FINDINGS: RIGHT LOWER EXTREMITY Common Femoral Vein:  No evidence of thrombus. Normal compressibility, respiratory phasicity and response to augmentation. Saphenofemoral Junction: No evidence of thrombus. Normal compressibility and flow on color Doppler imaging. Profunda Femoral Vein: No evidence of thrombus. Normal compressibility and flow on color Doppler imaging. Femoral Vein: No evidence of thrombus. Normal compressibility, respiratory phasicity and response to augmentation. Popliteal Vein: No evidence of thrombus. Normal compressibility, respiratory phasicity and response to augmentation. Calf Veins: No evidence of thrombus. Normal compressibility and flow on color Doppler imaging. Superficial Great Saphenous Vein: No evidence of thrombus. Normal compressibility. Venous Reflux:  None. Other Findings:  None. LEFT LOWER EXTREMITY Common Femoral Vein: No evidence of thrombus. Normal compressibility, respiratory phasicity and response  to augmentation. Saphenofemoral Junction: No evidence of thrombus. Normal compressibility and flow on color Doppler imaging. Profunda Femoral Vein: No evidence of thrombus. Normal compressibility and flow on color Doppler imaging. Femoral Vein: No evidence of thrombus. Normal compressibility, respiratory phasicity and response to augmentation. Popliteal Vein: No evidence of thrombus. Normal compressibility, respiratory phasicity and response to augmentation. Calf Veins: Appear patent where imaged. Superficial Great Saphenous Vein: No evidence of thrombus. Normal compressibility. Venous Reflux:  None. Other Findings:  None. IMPRESSION: No evidence of DVT within either lower extremity. Electronically Signed   By: Simonne Come M.D.   On: 06/21/2020 14:21   DG Chest Portable 1 View  Result Date: 06/21/2020 CLINICAL DATA:  Shortness of breath EXAM: PORTABLE CHEST 1 VIEW COMPARISON:  May 21, 2020 FINDINGS: There is a right pleural effusion with right base consolidation. There is cardiomegaly with mild pulmonary venous hypertension. There is  interstitial pulmonary edema. No evident adenopathy. No bone lesions IMPRESSION: Cardiomegaly with pulmonary vascular congestion and interstitial edema. Right pleural effusion. These are findings felt to be indicative of a degree of congestive heart failure. There is suspected superimposed pneumonia right base. Electronically Signed   By: Bretta Bang III M.D.   On: 06/21/2020 08:06        Scheduled Meds: . amLODipine  5 mg Oral Daily  . carvedilol  12.5 mg Oral BID WC  . enoxaparin (LOVENOX) injection  40 mg Subcutaneous Q12H  . Ferrous Fumarate  1 tablet Oral Q breakfast   And  . iron polysaccharides  150 mg Oral Q breakfast  . furosemide  40 mg Intravenous Q12H  . hydrocerin   Topical Daily  . ipratropium  0.5 mg Nebulization BID  . losartan  100 mg Oral Daily  . multivitamin with minerals  1 tablet Oral Daily  . simvastatin  20 mg Oral QHS  . sodium chloride flush  3 mL Intravenous Q12H  . tamsulosin  0.4 mg Oral Daily  . umeclidinium-vilanterol  1 puff Inhalation Daily  . vitamin B-12  1,000 mcg Oral Daily   Continuous Infusions: . sodium chloride       LOS: 1 day    Time spent: 30 minutes    Dorcas Carrow, MD [facility] Pager [phone]

## 2020-06-22 NOTE — Consult Note (Addendum)
WOC Nurse Consult Note: Reason for Consult: Consult requested for bilat legs.  Generalized edema but no open wounds or weeping; appearance is consistent with chronic venous stasis changes. Dry scaley skin in patchy areas.  Dressing procedure/placement/frequency: Topical treatment orders provided for bedside nurses to perform daily as follows: Apply Eucerin to bilat legs Q day after washing with soap and water and roughly towel-drying to assist with removal of loose skin.  Please re-consult if further assistance is needed.  Thank-you,  Cammie Mcgee MSN, RN, CWOCN, [place], CNS [phone]

## 2020-06-23 ENCOUNTER — Telehealth: Payer: Self-pay

## 2020-06-23 ENCOUNTER — Telehealth: Payer: Self-pay | Admitting: *Deleted

## 2020-06-23 DIAGNOSIS — J9621 Acute and chronic respiratory failure with hypoxia: Secondary | ICD-10-CM

## 2020-06-23 DIAGNOSIS — I5033 Acute on chronic diastolic (congestive) heart failure: Secondary | ICD-10-CM

## 2020-06-23 DIAGNOSIS — I10 Essential (primary) hypertension: Secondary | ICD-10-CM

## 2020-06-23 DIAGNOSIS — R609 Edema, unspecified: Secondary | ICD-10-CM

## 2020-06-23 DIAGNOSIS — I4891 Unspecified atrial fibrillation: Secondary | ICD-10-CM

## 2020-06-23 DIAGNOSIS — N183 Chronic kidney disease, stage 3 unspecified: Secondary | ICD-10-CM

## 2020-06-23 DIAGNOSIS — G4733 Obstructive sleep apnea (adult) (pediatric): Secondary | ICD-10-CM

## 2020-06-23 DIAGNOSIS — J441 Chronic obstructive pulmonary disease with (acute) exacerbation: Secondary | ICD-10-CM

## 2020-06-23 DIAGNOSIS — D638 Anemia in other chronic diseases classified elsewhere: Secondary | ICD-10-CM

## 2020-06-23 LAB — BASIC METABOLIC PANEL
Anion gap: 12 (ref 5–15)
BUN: 38 mg/dL — ABNORMAL HIGH (ref 8–23)
CO2: 42 mmol/L — ABNORMAL HIGH (ref 22–32)
Calcium: 9.1 mg/dL (ref 8.9–10.3)
Chloride: 90 mmol/L — ABNORMAL LOW (ref 98–111)
Creatinine, Ser: 1.4 mg/dL — ABNORMAL HIGH (ref 0.61–1.24)
GFR calc Af Amer: 57 mL/min — ABNORMAL LOW (ref >60)
GFR calc non Af Amer: 49 mL/min — ABNORMAL LOW (ref >60)
Glucose, Bld: 123 mg/dL — ABNORMAL HIGH (ref 70–99)
Potassium: 3.4 mmol/L — ABNORMAL LOW (ref 3.5–5.1)
Sodium: 144 mmol/L (ref 135–145)

## 2020-06-23 LAB — GLUCOSE, CAPILLARY
Glucose-Capillary: 133 mg/dL — ABNORMAL HIGH (ref 70–99)
Glucose-Capillary: 90 mg/dL (ref 70–99)

## 2020-06-23 LAB — TSH: TSH: 2.088 u[IU]/mL (ref 0.350–4.500)

## 2020-06-23 MED ORDER — FUROSEMIDE 10 MG/ML IJ SOLN: 40.0000 mg | INTRAMUSCULAR | Status: DC

## 2020-06-23 MED ORDER — APIXABAN 5 MG PO TABS
5.0000 mg | ORAL_TABLET | Freq: Two times a day (BID) | ORAL | Status: DC
  Administered 2020-06-23 – 2020-06-25 (×5): 5 mg via ORAL
  Filled 2020-06-23 (×5): qty 1

## 2020-06-23 MED ORDER — INSULIN ASPART 100 UNIT/ML ~~LOC~~ SOLN
0.0000 [IU] | Freq: Three times a day (TID) | SUBCUTANEOUS | Status: DC
  Administered 2020-06-23: 2 [IU] via SUBCUTANEOUS
  Administered 2020-06-24: 1 [IU] via SUBCUTANEOUS
  Filled 2020-06-23 (×3): qty 1

## 2020-06-23 MED ORDER — LOSARTAN POTASSIUM 50 MG PO TABS: 100.0000 mg | ORAL_TABLET | Freq: Every day | ORAL | Status: DC

## 2020-06-23 MED ORDER — POTASSIUM CHLORIDE CRYS ER 20 MEQ PO TBCR
20.0000 meq | EXTENDED_RELEASE_TABLET | Freq: Once | ORAL | Status: AC
  Administered 2020-06-23: 20 meq via ORAL
  Filled 2020-06-23: qty 1

## 2020-06-23 MED ORDER — FUROSEMIDE 10 MG/ML IJ SOLN: 40.0000 mg | Freq: Two times a day (BID) | INTRAMUSCULAR | Status: DC

## 2020-06-23 NOTE — Discharge Instructions (Signed)

## 2020-06-23 NOTE — Progress Notes (Signed)
Spoke to PT-  Patient's O2 sats dropped while ambulating on  2L.  O2 was turned up to 6L at that time.  Pt became SOB with some small amount of chest pain (later stated by patient).   Pt was returned to room, O2 sats improved at rest and oxygen was turned back down to 2L (Sats in 90s per PT)  Symptoms also improved (CP resolved).    Pt assessed-  Will continue to monitor

## 2020-06-23 NOTE — Telephone Encounter (Signed)
Pt scheduled for repeat cxr on 7/6. There is no order found. However, pt had cxr done in hospital on 6/28 & has been admitted.
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
## 2020-06-23 NOTE — Progress Notes (Signed)
PROGRESS NOTE   Victor Daniels  GNO:[id number]    DOB: 13-Nov-1946    DOA: 06/21/2020  PCP: [name], MD   I have briefly reviewed patients previous medical records in [facility].  Chief Complaint  Patient presents with  . Shortness of Breath  . Weakness    Brief Narrative:  74 year old married male, independent, PMH of COPD, chronic respiratory failure with hypoxia on home oxygen 2 L/min, chronic diastolic CHF, diet-controlled DM 2, OSA, CAD, CVA x2, prior alcoholic cardiomyopathy, presented to the ED due to worsening dyspnea, orthopnea, worsening bilateral lower extremity edema and mostly nonproductive cough.  Per EMS patient was diffusely wheezing and received Solu-Medrol and duo nebs in the field.  Admitted for acute on chronic diastolic CHF, acute on chronic respiratory failure with hypoxia.  Diuresed with IV Lasix, course complicated by AKI.  Noted A. fib with variable but controlled ventricular rate on 6/30,?  New.  Cardiology consulted.   Assessment & Plan:  Principal Problem:   Acute on chronic diastolic CHF (congestive heart failure) (HCC) Active Problems:   Hypertension   Venous insufficiency (chronic) (peripheral)   Venous insufficiency   Obesity   Obstructive sleep apnea   CHF (congestive heart failure), NYHA class I, acute on chronic, diastolic (HCC)   Acute on chronic diastolic CHF: May have been precipitated by A. fib.  Not sure if this is new versus old.  Patient reports that he has had it although not on anticoagulation.  Reviewing last cardiology note in 2019 did not reveal A. fib documentation.  TTE 6/29: LVEF >55%, LV diastolic parameters indeterminate.  Treated with IV Lasix 40 mg every 12 hours.  -3.7 L thus far.  Volume status has improved but still appears volume overloaded.  Has developed mild AKI.  Held this morning's dose of IV Lasix, skipped ARB dose for today.  Follow BMP closely in a.m.  Consulted cardiology for assistance.  Acute on  chronic hypoxic respiratory failure: Precipitated by decompensated CHF on top of underlying COPD and untreated OSA.  Patient is now back to his home level of oxygen 2 L/min.  A. fib with CVR: Today on telemetry noted patient with A. fib with variable but controlled ventricular rate.  Although patient indicates that he has had it in the past, did not find this in the last cardiology office notes and patient not on home anticoagulation.  Cardiology consulted for assistance.  Acute kidney injury complicating stage IIIa CKD: Likely related to aggressive diuresis with ongoing use of ARB.  ARB held.  Held this morning's dose of Lasix.  Follow BMP in a.m.  Patient reportedly has a follow-up appointment with nephrology/Dr. [name] in about 2 weeks time  OSA: Noncompliant with CPAP.  Outpatient follow-up with PCP and may need to repeat sleep study.  Lower extremity chronic venous stasis: Reportedly uses TED hose at home.  DVT ruled out.  Place compression stockings to mobilize fluids.  LEV Dopplers negative for DVT.  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  Essential hypertension: Mildly uncontrolled at times.  Continue amlodipine, carvedilol.  Held ARB.  Anemia: May be multifactorial due to chronic kidney disease and chronic disease.  Periodically follow CBCs.  Hypokalemia: Replace and follow.   Body mass index is 40.01 kg/m./Morbid obesity   DVT prophylaxis: Currently on Lovenox 40 mg twice daily.  Will need to discuss DOAC given history of A. fib Code Status: Full Family Communication: None advanced Disposition:  Status is:  Inpatient  Remains inpatient appropriate because:IV treatments appropriate due to intensity of illness or inability to take PO and Inpatient level of care appropriate due to severity of illness   Dispo: The patient is from: Home              Anticipated d/c is to: Home              Anticipated d/c date is: 2 days              Patient currently  is not medically stable to d/c.        Consultants:   Cardiology  Procedures:   None  Antimicrobials:   None   Subjective:  Overall feels better.  Dyspnea improved and breathing almost but not yet back to baseline.  Chronic leg swelling which had worsened, also improving but not back to baseline.  Minimal dry cough.  No chest pain.  Patient anxious for DC but understands that he needs to continue treatment in the hospital currently.  Objective:   Vitals:   06/23/20 0425 06/23/20 0739 06/23/20 0801 06/23/20 1134  BP: 130/83 (!) 152/78  (!) 105/91  Pulse: (!) 58 67  62  Resp:  18  18  Temp: 97.8 F (36.6 C) 98.2 F (36.8 C)  98.3 F (36.8 C)  TempSrc:  Oral    SpO2: 95% 95% 96% 96%  Weight:      Height:        General exam: Elderly male, moderately built and obese sitting up comfortably in chair without distress Respiratory system: Slightly harsh breath sounds, reduced in the bases, few bibasal crackles but otherwise clear to auscultation without wheezing or rhonchi.  No increased work of breathing. Cardiovascular system: S1 & S2 heard, irregularly irregular. No JVD, murmurs, rubs, gallops or clicks.  2-3+ pitting bilateral leg edema.  Telemetry personally reviewed: A. fib with variable but controlled ventricular rate. Gastrointestinal system: Abdomen is nondistended, soft and nontender. No organomegaly or masses felt. Normal bowel sounds heard. Central nervous system: Alert and oriented. No focal neurological deficits. Extremities: Symmetric 5 x 5 power. Skin: Changes of chronic venous stasis of bilateral legs without acute findings. Psychiatry: Judgement and insight appear normal. Mood & affect appropriate.     Data Reviewed:   I have personally reviewed following labs and imaging studies   CBC: Recent Labs  Lab 06/21/20 0749  WBC 11.2*  NEUTROABS 8.6*  HGB 9.9*  HCT 33.0*  MCV 90.7  PLT 158    Basic Metabolic Panel: Recent Labs  Lab 06/21/20 0749  06/22/20 0538 06/23/20 0526  NA 143 141 144  K 3.6 3.7 3.4*  CL 92* 91* 90*  CO2 36* 38* 42*  GLUCOSE 144* 150* 123*  BUN 27* 32* 38*  CREATININE 1.41* 1.18 1.40*  CALCIUM 9.2 9.0 9.1    Liver Function Tests: Recent Labs  Lab 06/21/20 0749  AST 19  ALT 17  ALKPHOS 85  BILITOT 1.1  PROT 7.2  ALBUMIN 3.1*    CBG: No results for input(s): GLUCAP in the last 168 hours.  Microbiology Studies:   Recent Results (from the past 240 hour(s))  Culture, blood (routine x 2)     Status: None (Preliminary result)   Collection Time: 06/21/20  9:32 AM   Specimen: BLOOD  Result Value Ref Range Status   Specimen Description BLOOD LEFT ANTECUBITAL  Final   Special Requests   Final    BOTTLES DRAWN AEROBIC AND ANAEROBIC Blood Culture adequate  volume   Culture   Final    NO GROWTH 2 DAYS Performed at [facility], [address] [place], [place] [address]    Report Status PENDING  Incomplete  Culture, blood (routine x 2)     Status: None (Preliminary result)   Collection Time: 06/21/20  9:32 AM   Specimen: BLOOD  Result Value Ref Range Status   Specimen Description BLOOD LEFT ARM  Final   Special Requests   Final    BOTTLES DRAWN AEROBIC AND ANAEROBIC Blood Culture adequate volume   Culture   Final    NO GROWTH 2 DAYS Performed at [facility], [address], [place], [place] [address]    Report Status PENDING  Incomplete  SARS Coronavirus 2 by RT PCR (hospital order, performed in [facility] hospital lab) Nasopharyngeal Urine, Clean Catch     Status: None   Collection Time: 06/21/20 10:22 AM   Specimen: Urine, Clean Catch; Nasopharyngeal  Result Value Ref Range Status   SARS Coronavirus 2 NEGATIVE NEGATIVE Final    Comment: (NOTE) SARS-CoV-2 target nucleic acids are NOT DETECTED.  The SARS-CoV-2 RNA is generally detectable in upper and lower respiratory specimens during the acute phase of infection. The lowest concentration of SARS-CoV-2 viral copies  this assay can detect is 250 copies / mL. A negative result does not preclude SARS-CoV-2 infection and should not be used as the sole basis for treatment or other patient management decisions.  A negative result may occur with improper specimen collection / handling, submission of specimen other than nasopharyngeal swab, presence of viral mutation(s) within the areas targeted by this assay, and inadequate number of viral copies (<250 copies / mL). A negative result must be combined with clinical observations, patient history, and epidemiological information.  Fact Sheet for Patients:   BoilerBrush.com.cy  Fact Sheet for Healthcare Providers: https://pope.com/  This test is not yet approved or  cleared by the [place] FDA and has been authorized for detection and/or diagnosis of SARS-CoV-2 by FDA under an Emergency Use Authorization (EUA).  This EUA will remain in effect (meaning this test can be used) for the duration of the COVID-19 declaration under Section 564(b)(1) of the Act, 21 U.S.C. section 360bbb-3(b)(1), unless the authorization is terminated or revoked sooner.  Performed at [facility], [address], [place], [place] [address]      Radiology Studies:  US Venous Img Lower Bilateral (DVT)  Result Date: 06/21/2020 CLINICAL DATA:  Bilateral lower extremity pain and edema for several years. History of previous DVT. Evaluate for acute or chronic DVT. EXAM: BILATERAL LOWER EXTREMITY VENOUS DOPPLER ULTRASOUND TECHNIQUE: Gray-scale sonography with graded compression, as well as color Doppler and duplex ultrasound were performed to evaluate the lower extremity deep venous systems from the level of the common femoral vein and including the common femoral, femoral, profunda femoral, popliteal and calf veins including the posterior tibial, peroneal and gastrocnemius veins when visible. The superficial great saphenous vein  was also interrogated. Spectral Doppler was utilized to evaluate flow at rest and with distal augmentation maneuvers in the common femoral, femoral and popliteal veins. COMPARISON:  None. FINDINGS: RIGHT LOWER EXTREMITY Common Femoral Vein: No evidence of thrombus. Normal compressibility, respiratory phasicity and response to augmentation. Saphenofemoral Junction: No evidence of thrombus. Normal compressibility and flow on color Doppler imaging. Profunda Femoral Vein: No evidence of thrombus. Normal compressibility and flow on color Doppler imaging. Femoral Vein: No evidence of thrombus. Normal compressibility, respiratory phasicity and response  to augmentation. Popliteal Vein: No evidence of thrombus. Normal compressibility, respiratory phasicity and response to augmentation. Calf Veins: No evidence of thrombus. Normal compressibility and flow on color Doppler imaging. Superficial Great Saphenous Vein: No evidence of thrombus. Normal compressibility. Venous Reflux:  None. Other Findings:  None. LEFT LOWER EXTREMITY Common Femoral Vein: No evidence of thrombus. Normal compressibility, respiratory phasicity and response to augmentation. Saphenofemoral Junction: No evidence of thrombus. Normal compressibility and flow on color Doppler imaging. Profunda Femoral Vein: No evidence of thrombus. Normal compressibility and flow on color Doppler imaging. Femoral Vein: No evidence of thrombus. Normal compressibility, respiratory phasicity and response to augmentation. Popliteal Vein: No evidence of thrombus. Normal compressibility, respiratory phasicity and response to augmentation. Calf Veins: Appear patent where imaged. Superficial Great Saphenous Vein: No evidence of thrombus. Normal compressibility. Venous Reflux:  None. Other Findings:  None. IMPRESSION: No evidence of DVT within either lower extremity. Electronically Signed   By: Simonne Come M.D.   On: 06/21/2020 14:21   ECHOCARDIOGRAM COMPLETE  Result Date:  06/22/2020    ECHOCARDIOGRAM REPORT   Patient Name:   Victor Daniels Date of Exam: 06/22/2020 Medical Rec #:  [id number]             Height:       72.0 in Accession #:    [id number]            Weight:       299.0 lb Date of Birth:  December 24, 1946            BSA:          2.528 m Patient Age:    73 years              BP:           121/86 mmHg Patient Gender: M                     HR:           56 bpm. Exam Location:  [facility] Procedure: 2D Echo, Color Doppler, Cardiac Doppler and Intracardiac            Opacification Agent Indications:     I50.31 CHF-Acute Diastolic  History:         Patient has prior history of Echocardiogram examinations. CHF,                  Previous Myocardial Infarction and CAD, Stroke, PAD, COPD and                  PAD; Risk Factors:Sleep Apnea, Hypertension, Diabetes and                  Dyslipidemia.  Sonographer:     Humphrey Rolls RDCS (AE) Referring Phys:  [id number] [employer] Diagnosing Phys: Yvonne Kendall MD  Sonographer Comments: Technically challenging study due to limited acoustic windows. Image acquisition challenging due to patient body habitus and Image acquisition challenging due to COPD. IMPRESSIONS  1. Left ventricular ejection fraction, by estimation, is >55%. The left ventricle has normal function. Left ventricular endocardial border not optimally defined to evaluate regional wall motion. Left ventricular diastolic parameters are indeterminate.  2. Right ventricular systolic function was not well visualized. The right ventricular size is not well visualized.  3. The mitral valve was not well visualized. Unable to assess mitral valve regurgitation.  4. Tricuspid valve regurgitation not well visualized.  5. The aortic valve was not well visualized. Aortic valve  regurgitation not well assessed. No aortic stenosis is present.  6. Pulmonic valve regurgitation not well visualized.  7. The inferior vena cava is dilated in size with <50% respiratory variability, suggesting right  atrial pressure of 15 mmHg. FINDINGS  Left Ventricle: Left ventricular dimensions are not well characterized. Left ventricular ejection fraction, by estimation, is >55%. The left ventricle has normal function. Left ventricular endocardial border not optimally defined to evaluate regional wall motion. Definity contrast agent was given IV to delineate the left ventricular endocardial borders. Left ventricular diastolic parameters are indeterminate. Right Ventricle: The right ventricular size is not well visualized. Right vetricular wall thickness was not assessed. Right ventricular systolic function was not well visualized. Left Atrium: Left atrial size was not well visualized. Right Atrium: Right atrial size was not well visualized. Pericardium: There is no evidence of pericardial effusion. Mitral Valve: The mitral valve was not well visualized. Unable to assess mitral valve regurgitation. MV peak gradient, 8.2 mmHg. The mean mitral valve gradient is 2.0 mmHg. Tricuspid Valve: The tricuspid valve is not well visualized. Tricuspid valve regurgitation not well visualized. Aortic Valve: The aortic valve was not well visualized. Aortic valve regurgitation not well assessed. No aortic stenosis is present. Aortic valve mean gradient measures 6.0 mmHg. Aortic valve peak gradient measures 11.8 mmHg. Pulmonic Valve: The pulmonic valve was not well visualized. Pulmonic valve regurgitation not well visualized. Aorta: The aortic root was not well visualized. Pulmonary Artery: The pulmonary artery is not well seen. Venous: The inferior vena cava is dilated in size with less than 50% respiratory variability, suggesting right atrial pressure of 15 mmHg. IAS/Shunts: No atrial level shunt detected by color flow Doppler.   Diastology LV e' lateral:   12.10 cm/s LV E/e' lateral: 11.0 LV e' medial:    7.07 cm/s LV E/e' medial:  18.9  AORTIC VALVE AV Vmax:           172.00 cm/s AV Vmean:          107.000 cm/s AV VTI:            0.337 m  AV Peak Grad:      11.8 mmHg AV Mean Grad:      6.0 mmHg LVOT Vmax:         130.00 cm/s LVOT Vmean:        82.300 cm/s LVOT VTI:          0.259 m LVOT/AV VTI ratio: 0.77 MITRAL VALVE MV Area (PHT): 3.05 cm     SHUNTS MV Peak grad:  8.2 mmHg     Systemic VTI: 0.26 m MV Mean grad:  2.0 mmHg MV Vmax:       1.43 m/s MV Vmean:      63.9 cm/s MV Decel Time: 249 msec MV E velocity: 133.67 cm/s Yvonne Kendallhristopher End MD Electronically signed by Yvonne Kendallhristopher End MD Signature Date/Time: 06/22/2020/1:48:00 PM    Final      Scheduled Meds:   . amLODipine  5 mg Oral Daily  . carvedilol  12.5 mg Oral BID WC  . enoxaparin (LOVENOX) injection  40 mg Subcutaneous Q12H  . Ferrous Fumarate  1 tablet Oral Q breakfast   And  . iron polysaccharides  150 mg Oral Q breakfast  . furosemide  40 mg Intravenous Q12H  . hydrocerin   Topical Daily  . ipratropium  0.5 mg Nebulization BID  . [START ON 06/24/2020] losartan  100 mg Oral Daily  . multivitamin with minerals  1 tablet Oral Daily  .  potassium chloride  20 mEq Oral Once  . simvastatin  20 mg Oral QHS  . sodium chloride flush  3 mL Intravenous Q12H  . tamsulosin  0.4 mg Oral Daily  . umeclidinium-vilanterol  1 puff Inhalation Daily  . vitamin B-12  1,000 mcg Oral Daily    Continuous Infusions:   . sodium chloride       LOS: 2 days     Marcellus Scott, MD, [place], [facility]. [facility]    To contact the attending provider between 7A-7P or the covering provider during after hours 7P-7A, please log into the web site [email or web address] and access using universal [place] password for that web site. If you do not have the password, please call the hospital operator.  06/23/2020, 12:45 PM

## 2020-06-23 NOTE — Progress Notes (Signed)
Physical Therapy Treatment Patient Details Name: Victor Daniels MRN: [id number] DOB: Oct 05, 1946 Today's Date: 06/23/2020    History of Present Illness Victor Daniels is a 74 y.o. male with medical history significant for history of COPD, history of CHF, diet-controlled diabetes mellitus, chronic respiratory failure on 2 L of oxygen history of obstructive sleep apnea, lower extremity venous insufficiency who presents to the ER for evaluation of worsening shortness of breath for the past 2 days associated with orthopnea and bilateral lower extremity swelling    PT Comments    Pt was seated EOB with MD in room talking about care plan upon arriving. After MD leaves room, pt completely forgot he was in room or able to recall what was discussed. STM loss? RN notified of cognition concerns but overall pt is A and O x 3. He follows commands consistently and was cooperative and pleasant. On 2 L [place] at rest. He stood without assistance and ambulated 80 ft prior to returning to room to urinate (100 CC). On 2nd trial of ambulation 80 ft, pt desats to 74% and O2 increased to 6 L to return to room. Once seated with BLEs elevated and breathing techniques performed, sao2 increased back to mid 90s with O2 returned to 2 L. Pt reports fatigue and requested to rest. RN aware. HR stable throughout ambulation between 88 bpm and 104 bpm.  Re-educated on importance of BLE elevation to reduce edema. Pt was seated in recliner with BLEs elevated, call bell in reach, and RN aware of his abilities.    Follow Up Recommendations  Home health PT     Equipment Recommendations  None recommended by PT    Recommendations for Other Services       Precautions / Restrictions Precautions Precautions: Fall Restrictions Weight Bearing Restrictions: No    Mobility  Bed Mobility               General bed mobility comments: pt was seated EOB upon arriving and seated in recliner at  conclusion.  Transfers Overall transfer level: Needs assistance Equipment used: Rolling walker (2 wheeled) Transfers: Sit to/from Stand Sit to Stand: Supervision         General transfer comment: Pt was able to STS from EOB and from recliner with supervision only. He was on 2 L O2 with sao2 92% at rest.   Ambulation/Gait Ambulation/Gait assistance: Min guard Gait Distance (Feet): 80 Feet Assistive device: Rolling walker (2 wheeled) Gait Pattern/deviations: Step-through pattern;Trunk flexed;Wide base of support Gait velocity: decreased   General Gait Details: Pt ambulated 2 x ~ 80 ft with bariatric RW. He needed to turn around to go to BR (urinated 100 CC) prior to ambulation 2nd trial. Sao2 desats to 74 % on 2 L. increased O2 to 6L while pt ambulated back to room. recovered to 94% and O2 decreased back to 2 L [place]. RN aware of O2 and urination. pt fatigued quickly but HR stable throughout.   Stairs             Wheelchair Mobility    Modified Rankin (Stroke Patients Only)       Balance Overall balance assessment: Needs assistance Sitting-balance support: Feet supported;No upper extremity supported Sitting balance-Leahy Scale: Good Sitting balance - Comments: no LOB in sitting   Standing balance support: Bilateral upper extremity supported;During functional activity Standing balance-Leahy Scale: Fair Standing balance comment: heavy reliance on RW for UE support.  Cognition Arousal/Alertness: Awake/alert Behavior During Therapy: WFL for tasks assessed/performed Overall Cognitive Status: Impaired/Different from baseline Area of Impairment: Orientation;Attention                   Current Attention Level: Selective;Divided           General Comments: MD in room upon therapist arriving. After MD left room pt forgot he was there or anything he stated. RN in room and does report pt having short term memory deficits.  However pt is A and O x 3. some confusion about situation.      Exercises      General Comments        Pertinent Vitals/Pain Pain Assessment: No/denies pain    Home Living                      Prior Function            PT Goals (current goals can now be found in the care plan section) Acute Rehab PT Goals Patient Stated Goal: to return home Progress towards PT goals: Progressing toward goals    Frequency    Min 2X/week      PT Plan Current plan remains appropriate    Co-evaluation              AM-PAC PT "6 Clicks" Mobility   Outcome Measure  Help needed turning from your back to your side while in a flat bed without using bedrails?: A Little Help needed moving from lying on your back to sitting on the side of a flat bed without using bedrails?: A Little Help needed moving to and from a bed to a chair (including a wheelchair)?: A Little Help needed standing up from a chair using your arms (e.g., wheelchair or bedside chair)?: A Little Help needed to walk in hospital room?: A Little Help needed climbing 3-5 steps with a railing? : A Lot 6 Click Score: 17    End of Session Equipment Utilized During Treatment: Gait belt;Oxygen Activity Tolerance: Patient limited by fatigue Patient left: in chair;with chair alarm set;with call bell/phone within reach;with nursing/sitter in room Nurse Communication: Mobility status PT Visit Diagnosis: Difficulty in walking, not elsewhere classified (R26.2);Muscle weakness (generalized) (M62.81)     Time: 1420-1450 PT Time Calculation (min) (ACUTE ONLY): 30 min  Charges:  $Gait Training: 8-22 mins $Therapeutic Activity: 8-22 mins                     Jetta Lout PTA 06/23/20, 3:48 PM

## 2020-06-23 NOTE — TOC Initial Note (Addendum)
Transition of Care [facility]) - Initial/Assessment Note    Patient Details  Name: Victor Daniels MRN: [id number] Date of Birth: 03-Feb-1946  Transition of Care [facility]) CM/SW Contact:    Maree Krabbe, LCSW Phone Number: 06/23/2020, 12:26 PM  Clinical Narrative:  Pt lives at home with spouse. Pt is agreeable to [facility]. Pt did not have a agency preference. Pt has a walker at home. 3n1 ordered through [employer]-- will be delivered at bedside.  [facility] will service pt.                 Expected Discharge Plan: Home w Home Health Services Barriers to Discharge: Continued Medical Work up   Patient Goals and CMS Choice Patient states their goals for this hospitalization and ongoing recovery are:: to get better   Choice offered to / list presented to : Patient  Expected Discharge Plan and Services Expected Discharge Plan: Home w Home Health Services In-house Referral: Clinical Social Work   Post Acute Care Choice: Home Health Living arrangements for the past 2 months: Single Family Home                           HH Arranged: PT, RN HH Agency: [facility] Date [facility] Agency Contacted: 06/23/20 Time HH Agency Contacted: [phone] Representative spoke with at [facility] Agency: brittany  Prior Living Arrangements/Services Living arrangements for the past 2 months: Single Family Home Lives with:: Spouse Patient language and need for interpreter reviewed:: Yes Do you feel safe going back to the place where you live?: Yes      Need for Family Participation in Patient Care: Yes (Comment) Care giver support system in place?: Yes (comment)   Criminal Activity/Legal Involvement Pertinent to Current Situation/Hospitalization: No - Comment as needed  Activities of Daily Living Home Assistive Devices/Equipment: Cane (specify quad or straight), Oxygen, Eyeglasses ADL Screening (condition at time of admission) Patient's cognitive ability adequate to safely complete daily activities?: Yes Is the patient  deaf or have difficulty hearing?: No Does the patient have difficulty seeing, even when wearing glasses/contacts?: No Does the patient have difficulty concentrating, remembering, or making decisions?: No Patient able to express need for assistance with ADLs?: Yes Does the patient have difficulty dressing or bathing?: No Independently performs ADLs?: Yes (appropriate for developmental age) Does the patient have difficulty walking or climbing stairs?: Yes Weakness of Legs: None Weakness of Arms/Hands: None  Permission Sought/Granted Permission sought to share information with : Family Supports    Share Information with NAME: carolyn  Permission granted to share info w AGENCY: [facility]  Permission granted to share info w Relationship: spouse     Emotional Assessment Appearance:: Appears stated age Attitude/Demeanor/Rapport: Engaged Affect (typically observed): Accepting, Appropriate Orientation: : Oriented to Self, Oriented to Place, Oriented to  Time, Oriented to Situation Alcohol / Substance Use: Not Applicable Psych Involvement: No (comment)  Admission diagnosis:  Swelling [R60.9] Pleural effusion [J90] Hypoxia [R09.02] COPD exacerbation (HCC) [J44.1] CHF (congestive heart failure), NYHA class I, acute on chronic, diastolic (HCC) [I50.33] Community acquired pneumonia of right lung, unspecified part of lung [J18.9] Congestive heart failure, unspecified HF chronicity, unspecified heart failure type (HCC) [I50.9] Patient Active Problem List   Diagnosis Date Noted  . Acute on chronic diastolic CHF (congestive heart failure) (HCC) 06/21/2020  . CHF (congestive heart failure), NYHA class I, acute on chronic, diastolic (HCC) 06/21/2020  . CKD (chronic kidney disease) stage 3, GFR 30-59 ml/min 03/30/2020  .  Diabetic foot ulcer associated with type 2 diabetes mellitus (HCC) 07/23/2019  . Degenerative joint disease of shoulder region 09/26/2018  . Obstructive sleep apnea 11/22/2016  .  Hypogonadism male 07/07/2016  . Obesity 06/03/2016  . Reduced libido 05/11/2016  . Visit for preventive health examination 03/01/2016  . Diabetes mellitus with circulatory complication (HCC) 03/01/2016  . Shoulder pain, left 08/03/2015  . Pneumonia 04/13/2015  . Marital conflict involving estrangement 04/13/2015  . Cervical radiculopathy due to degenerative joint disease of spine 03/24/2015  . Iron deficiency anemia 02/25/2015  . Fatigue 02/25/2015  . Nocturia 02/25/2015  . Pernicious anemia 01/14/2015  . S/P hip replacement 12/12/2014  . S/p nephrectomy 09/07/2014  . Postoperative state 09/07/2014  . Vertigo, central 08/20/2014  . Senile purpura (HCC) 06/28/2014  . Incomplete emptying of bladder 10/08/2013  . Anemia 09/11/2013  . Chest pain with high risk for cardiac etiology 04/08/2013  . Benign localized prostatic hyperplasia with lower urinary tract symptoms (LUTS) 10/23/2012  . Hematospermia 10/23/2012  . Sciatica 10/23/2012  . Venous insufficiency (chronic) (peripheral) 07/08/2012  . Venous insufficiency   . History of alcohol abuse   . Vasomotor rhinitis 12/06/2011  . CAD (coronary artery disease) 10/12/2011  . COPD (chronic obstructive pulmonary disease) (HCC)   . CHF with left ventricular diastolic dysfunction, NYHA class 2 (HCC)   . Hyperlipidemia   . Hypertension    PCP:  [name], MD Pharmacy:   [facility] [address] ([address]), [place] - [address] [place] ([place]) [place] [address] Phone: [phone] Fax: [phone]  PRIMEM[facility] ORDER) ELECTRONIC - [place], [place] - [address] [place] [address] Phone: [phone] Fax: [phone]  [facility] - [place], [place] - [address] [place] [address] Phone: [phone] Fax: [phone]     Social Determinants of Health (SDOH) Interventions    Readmission Risk  Interventions No flowsheet data found.

## 2020-06-23 NOTE — Progress Notes (Addendum)
Patient pulled his IV out.

## 2020-06-23 NOTE — Telephone Encounter (Signed)
Probably won't need it ,  But I ordered it just in case he does

## 2020-06-23 NOTE — Telephone Encounter (Signed)
Error

## 2020-06-24 DIAGNOSIS — R0902 Hypoxemia: Secondary | ICD-10-CM

## 2020-06-24 DIAGNOSIS — J441 Chronic obstructive pulmonary disease with (acute) exacerbation: Secondary | ICD-10-CM

## 2020-06-24 DIAGNOSIS — I4819 Other persistent atrial fibrillation: Secondary | ICD-10-CM

## 2020-06-24 DIAGNOSIS — J90 Pleural effusion, not elsewhere classified: Secondary | ICD-10-CM

## 2020-06-24 LAB — BASIC METABOLIC PANEL
Anion gap: 7 (ref 5–15)
BUN: 35 mg/dL — ABNORMAL HIGH (ref 8–23)
CO2: 45 mmol/L — ABNORMAL HIGH (ref 22–32)
Calcium: 8.9 mg/dL (ref 8.9–10.3)
Chloride: 91 mmol/L — ABNORMAL LOW (ref 98–111)
Creatinine, Ser: 1.3 mg/dL — ABNORMAL HIGH (ref 0.61–1.24)
GFR calc Af Amer: >60 mL/min (ref >60)
GFR calc non Af Amer: 54 mL/min — ABNORMAL LOW (ref >60)
Glucose, Bld: 120 mg/dL — ABNORMAL HIGH (ref 70–99)
Potassium: 3.7 mmol/L (ref 3.5–5.1)
Sodium: 143 mmol/L (ref 135–145)

## 2020-06-24 LAB — CBC
HCT: 30.1 % — ABNORMAL LOW (ref 39.0–52.0)
Hemoglobin: 9 g/dL — ABNORMAL LOW (ref 13.0–17.0)
MCH: 27.6 pg (ref 26.0–34.0)
MCHC: 29.9 g/dL — ABNORMAL LOW (ref 30.0–36.0)
MCV: 92.3 fL (ref 80.0–100.0)
Platelets: 120 10*3/uL — ABNORMAL LOW (ref 150–400)
RBC: 3.26 MIL/uL — ABNORMAL LOW (ref 4.22–5.81)
RDW: 16.3 % — ABNORMAL HIGH (ref 11.5–15.5)
WBC: 6.9 10*3/uL (ref 4.0–10.5)
nRBC: 0 % (ref 0.0–0.2)

## 2020-06-24 LAB — GLUCOSE, CAPILLARY
Glucose-Capillary: 131 mg/dL — ABNORMAL HIGH (ref 70–99)
Glucose-Capillary: 127 mg/dL — ABNORMAL HIGH (ref 70–99)
Glucose-Capillary: 110 mg/dL — ABNORMAL HIGH (ref 70–99)
Glucose-Capillary: 167 mg/dL — ABNORMAL HIGH (ref 70–99)

## 2020-06-24 LAB — HEMOGLOBIN A1C
Hgb A1c MFr Bld: 6.3 % — ABNORMAL HIGH (ref 4.8–5.6)
Mean Plasma Glucose: 134 mg/dL

## 2020-06-24 MED ORDER — OXYCODONE HCL 5 MG PO TABS
10.0000 mg | ORAL_TABLET | Freq: Four times a day (QID) | ORAL | Status: DC | PRN
  Administered 2020-06-24: 10 mg via ORAL
  Filled 2020-06-24: qty 2

## 2020-06-24 MED ORDER — FUROSEMIDE 10 MG/ML IJ SOLN
40.0000 mg | Freq: Once | INTRAMUSCULAR | Status: AC
  Administered 2020-06-24: 40 mg via INTRAVENOUS
  Filled 2020-06-24: qty 4

## 2020-06-24 MED ORDER — FUROSEMIDE 10 MG/ML IJ SOLN
40.0000 mg | INTRAMUSCULAR | Status: DC
  Administered 2020-06-25: 40 mg via INTRAVENOUS
  Filled 2020-06-24: qty 4

## 2020-06-24 MED ORDER — POTASSIUM CHLORIDE CRYS ER 20 MEQ PO TBCR
40.0000 meq | EXTENDED_RELEASE_TABLET | Freq: Once | ORAL | Status: AC
  Administered 2020-06-24: 40 meq via ORAL
  Filled 2020-06-24: qty 2

## 2020-06-24 MED ORDER — GUAIFENESIN-DM 100-10 MG/5ML PO SYRP
15.0000 mL | ORAL_SOLUTION | ORAL | Status: DC | PRN
  Administered 2020-06-24 – 2020-06-25 (×2): 15 mL via ORAL
  Filled 2020-06-24 (×2): qty 15

## 2020-06-24 NOTE — Progress Notes (Signed)
Physical Therapy Treatment Patient Details Name: Victor Daniels MRN: [id number] DOB: 1946/06/29 Today's Date: 06/24/2020    History of Present Illness Victor Daniels is a 74 y.o. male with medical history significant for history of COPD, history of CHF, diet-controlled diabetes mellitus, chronic respiratory failure on 2 L of oxygen history of obstructive sleep apnea, lower extremity venous insufficiency who presents to the ER for evaluation of worsening shortness of breath for the past 2 days associated with orthopnea and bilateral lower extremity swelling    PT Comments    Pt alert, in recliner agreeable to PT. Denied pain, on 2L via [place]. With RN consent pt placed on 3L for mobility attempts. The patient demonstrated sit<>stand transfers with bariatric RW and supervision several times. He was able to ambulate ~64ft twice with RW and CGA, seated rest break in between bouts of 38ft. SpO2 monitored continuously, lowest readings was 88% upon returning to chair ea time, recovered >90% quickly. Pt without LOB, unsteadiness, but did fatigue and exhibited SOB. Returned to 2L at rest, 93% spO2 at end of session, all needs in reach. Pt was oriented and alert throughout, able to recall PTAs name from last session, no cognitive deficits noted. The patient would benefit from further skilled PT intervention to continue to progress towards goals. Recommendation remains appropriate.       Follow Up Recommendations  Home health PT     Equipment Recommendations  None recommended by PT    Recommendations for Other Services       Precautions / Restrictions Precautions Precautions: Fall Restrictions Weight Bearing Restrictions: No    Mobility  Bed Mobility               General bed mobility comments: up in recliner at start of session  Transfers Overall transfer level: Needs assistance Equipment used: Rolling walker (2 wheeled) Transfers: Sit to/from Stand Sit to Stand:  Supervision         General transfer comment: spo2 at 3L 96% at rest  Ambulation/Gait Ambulation/Gait assistance: Min guard Gait Distance (Feet): 80 Feet (51ft x2 after seated rest break) Assistive device: Rolling walker (2 wheeled) Gait Pattern/deviations: Step-through pattern;Trunk flexed;Wide base of support Gait velocity: decreased   General Gait Details: Pt needed to urinate before and after ea ambulation trial. CGA with bariatric walker, on 3L via RN consent during mobility   Stairs             Wheelchair Mobility    Modified Rankin (Stroke Patients Only)       Balance Overall balance assessment: Needs assistance Sitting-balance support: Feet supported;No upper extremity supported Sitting balance-Leahy Scale: Good Sitting balance - Comments: no LOB in sitting   Standing balance support: Bilateral upper extremity supported;During functional activity Standing balance-Leahy Scale: Fair Standing balance comment: heavy reliance on RW for UE support.                            Cognition Arousal/Alertness: Awake/alert Behavior During Therapy: WFL for tasks assessed/performed Overall Cognitive Status: Within Functional Limits for tasks assessed                                 General Comments: pt oriented, followed commands. Recalled previous PTA name from last session.      Exercises Other Exercises Other Exercises: educated on PLB and continued mobility    General Comments General comments (  skin integrity, edema, etc.): spO2 monitored closely throughout session. on 3L via [place] throughout mobility with RN consent. Decreased to 88% once returning to sitting, recovered quickly >90%. Returned to 2L at end of session.      Pertinent Vitals/Pain Pain Assessment: No/denies pain    Home Living                      Prior Function            PT Goals (current goals can now be found in the care plan section) Progress towards  PT goals: Progressing toward goals    Frequency    Min 2X/week      PT Plan Current plan remains appropriate    Co-evaluation              AM-PAC PT "6 Clicks" Mobility   Outcome Measure  Help needed turning from your back to your side while in a flat bed without using bedrails?: A Little Help needed moving from lying on your back to sitting on the side of a flat bed without using bedrails?: A Little Help needed moving to and from a bed to a chair (including a wheelchair)?: A Little Help needed standing up from a chair using your arms (e.g., wheelchair or bedside chair)?: A Little Help needed to walk in hospital room?: A Little Help needed climbing 3-5 steps with a railing? : A Lot 6 Click Score: 17    End of Session Equipment Utilized During Treatment: Gait belt;Oxygen (2-3L) Activity Tolerance: Patient tolerated treatment well Patient left: in chair;with chair alarm set;with call bell/phone within reach;with nursing/sitter in room Nurse Communication: Mobility status PT Visit Diagnosis: Difficulty in walking, not elsewhere classified (R26.2);Muscle weakness (generalized) (M62.81)     Time: 1100-1135 PT Time Calculation (min) (ACUTE ONLY): 35 min  Charges:  $Therapeutic Exercise: 23-37 mins                     Olga Coaster PT, DPT 11:59 AM,06/24/20
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
## 2020-06-24 NOTE — Progress Notes (Signed)
PROGRESS NOTE   Victor Daniels  DXI:[id number]    DOB: 1946/10/14    DOA: 06/21/2020  PCP: [name], MD   I have briefly reviewed patients previous medical records in [facility].  Chief Complaint  Patient presents with  . Shortness of Breath  . Weakness    Brief Narrative:  74 year old married male, independent, PMH of COPD, chronic respiratory failure with hypoxia on home oxygen 2 L/min, chronic diastolic CHF, diet-controlled DM 2, OSA, CAD, CVA x2, prior alcoholic cardiomyopathy, presented to the ED due to worsening dyspnea, orthopnea, worsening bilateral lower extremity edema and mostly nonproductive cough.  Per EMS patient was diffusely wheezing and received Solu-Medrol and duo nebs in the field.  Admitted for acute on chronic diastolic CHF, acute on chronic respiratory failure with hypoxia.  Diuresed with IV Lasix, course complicated by AKI.  Noted A. fib with variable but controlled ventricular rate on 6/30,?  New.  Cardiology consulted.   Assessment & Plan:  Principal Problem:   Acute on chronic diastolic CHF (congestive heart failure) (HCC) Active Problems:   Hypertension   Venous insufficiency (chronic) (peripheral)   Venous insufficiency   Obesity   Obstructive sleep apnea   CHF (congestive heart failure), NYHA class I, acute on chronic, diastolic (HCC)   Swelling   Atrial fibrillation (HCC)   Acute on chronic diastolic CHF: May have been precipitated by A. fib.  Not sure if this is new versus old.  Patient reports that he has had it although not on anticoagulation.  Reviewing last cardiology note in 2019 did not reveal A. fib documentation.  TTE 6/29: LVEF >55%, LV diastolic parameters indeterminate.  Treated with IV Lasix 40 mg every 12 hours.  -3.7 L thus far.  Volume status has improved but still appears volume overloaded.  Has developed mild AKI.  Held this morning's dose of IV Lasix, skipped ARB dose for today.  Follow BMP closely in a.m.  Consulted  cardiology for assistance.  Cardiology follow-up appreciated. Still volume overloaded. Cardiology has coaxed him into staying tonight and diuresing with IV Lasix. Patient insisted on going home tomorrow.  Acute on chronic hypoxic respiratory failure: Precipitated by decompensated CHF on top of underlying COPD and untreated OSA.  Patient is now back to his home level of oxygen 2 L/min.  A. fib with CVR: Today on telemetry noted patient with A. fib with variable but controlled ventricular rate.  Although patient indicates that he has had it in the past, did not find this in the last cardiology office notes and patient not on home anticoagulation.  Cardiology input appreciated. Eliquis initiated yesterday.  Acute kidney injury complicating stage IIIa CKD: Likely related to aggressive diuresis with ongoing use of ARB.  ARB held.  Held this morning's dose of Lasix.  Follow BMP in a.m.  Patient reportedly has a follow-up appointment with nephrology/Dr. [name] in about 2 weeks time. Creatinine has slightly improved to 1.3.  OSA: Noncompliant with CPAP.  Outpatient follow-up with PCP and may need to repeat sleep study.  Lower extremity chronic venous stasis: Reportedly uses TED hose at home.  DVT ruled out.  Place compression stockings to mobilize fluids.  LEV Dopplers negative for DVT. Now has bilateral knee-high TED hoses.  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  Essential hypertension: Mildly uncontrolled at times.  Continue amlodipine, carvedilol.  Held ARB.  Anemia: May be multifactorial due to chronic kidney disease and chronic disease.  Periodically follow CBCs.  Hypokalemia: Replaced   Body mass index is 40.06 kg/m./Morbid obesity   DVT prophylaxis: Currently on Lovenox 40 mg twice daily.  Will need to discuss DOAC given history of A. fib Code Status: Full Family Communication: None advanced Disposition:  Status is: Inpatient  Remains inpatient appropriate  because:IV treatments appropriate due to intensity of illness or inability to take PO and Inpatient level of care appropriate due to severity of illness   Dispo: The patient is from: Home              Anticipated d/c is to: Home              Anticipated d/c date is: 2 days              Patient currently is not medically stable to d/c.        Consultants:   Cardiology  Procedures:   None  Antimicrobials:   None   Subjective:  Dyspnea about 90% better. No chest pain. Still anxious to go home.  Objective:   Vitals:   06/24/20 0746 06/24/20 0811 06/24/20 1107 06/24/20 1641  BP: (!) 141/69  106/73   Pulse: 65  70 (!) 54  Resp: 20  18   Temp: 98.3 F (36.8 C)  98.1 F (36.7 C)   TempSrc:   Oral   SpO2: 97% 96% 99%   Weight:      Height:        General exam: Elderly male, moderately built and obese sitting up comfortably in chair without distress Respiratory system: Slightly harsh breath sounds, reduced in the bases, few bibasal crackles but otherwise clear to auscultation without wheezing or rhonchi.  No increased work of breathing. Lungs may be sounding a little better than yesterday. Cardiovascular system: S1 & S2 heard, irregularly irregular. No JVD, murmurs, rubs, gallops or clicks. Leg edema better after TED hoses.  Telemetry personally reviewed: A. fib with variable but controlled ventricular rate. Gastrointestinal system: Abdomen is nondistended, soft and nontender. No organomegaly or masses felt. Normal bowel sounds heard. Central nervous system: Alert and oriented. No focal neurological deficits. Extremities: Symmetric 5 x 5 power. Skin: Changes of chronic venous stasis of bilateral legs without acute findings. Psychiatry: Judgement and insight appear normal. Mood & affect appropriate.     Data Reviewed:   I have personally reviewed following labs and imaging studies   CBC: Recent Labs  Lab 06/21/20 0749 06/24/20 0445  WBC 11.2* 6.9  NEUTROABS 8.6*   --   HGB 9.9* 9.0*  HCT 33.0* 30.1*  MCV 90.7 92.3  PLT 158 120*    Basic Metabolic Panel: Recent Labs  Lab 06/22/20 0538 06/23/20 0526 06/24/20 0445  NA 141 144 143  K 3.7 3.4* 3.7  CL 91* 90* 91*  CO2 38* 42* 45*  GLUCOSE 150* 123* 120*  BUN 32* 38* 35*  CREATININE 1.18 1.40* 1.30*  CALCIUM 9.0 9.1 8.9    Liver Function Tests: Recent Labs  Lab 06/21/20 0749  AST 19  ALT 17  ALKPHOS 85  BILITOT 1.1  PROT 7.2  ALBUMIN 3.1*    CBG: Recent Labs  Lab 06/24/20 0934 06/24/20 1107 06/24/20 1613  GLUCAP 131* 127* 110*    Microbiology Studies:   Recent Results (from the past 240 hour(s))  Culture, blood (routine x 2)     Status: None (Preliminary result)   Collection Time: 06/21/20  9:32 AM   Specimen: BLOOD  Result Value Ref Range Status   Specimen  Description BLOOD LEFT ANTECUBITAL  Final   Special Requests   Final    BOTTLES DRAWN AEROBIC AND ANAEROBIC Blood Culture adequate volume   Culture   Final    NO GROWTH 3 DAYS Performed at [facility], [address] [place], [place] [address]    Report Status PENDING  Incomplete  Culture, blood (routine x 2)     Status: None (Preliminary result)   Collection Time: 06/21/20  9:32 AM   Specimen: BLOOD  Result Value Ref Range Status   Specimen Description BLOOD LEFT ARM  Final   Special Requests   Final    BOTTLES DRAWN AEROBIC AND ANAEROBIC Blood Culture adequate volume   Culture   Final    NO GROWTH 3 DAYS Performed at [facility], [address], [place], [place] [address]    Report Status PENDING  Incomplete  SARS Coronavirus 2 by RT PCR (hospital order, performed in [facility] hospital lab) Nasopharyngeal Urine, Clean Catch     Status: None   Collection Time: 06/21/20 10:22 AM   Specimen: Urine, Clean Catch; Nasopharyngeal  Result Value Ref Range Status   SARS Coronavirus 2 NEGATIVE NEGATIVE Final    Comment: (NOTE) SARS-CoV-2 target nucleic acids are NOT DETECTED.  The  SARS-CoV-2 RNA is generally detectable in upper and lower respiratory specimens during the acute phase of infection. The lowest concentration of SARS-CoV-2 viral copies this assay can detect is 250 copies / mL. A negative result does not preclude SARS-CoV-2 infection and should not be used as the sole basis for treatment or other patient management decisions.  A negative result may occur with improper specimen collection / handling, submission of specimen other than nasopharyngeal swab, presence of viral mutation(s) within the areas targeted by this assay, and inadequate number of viral copies (<250 copies / mL). A negative result must be combined with clinical observations, patient history, and epidemiological information.  Fact Sheet for Patients:   BoilerBrush.com.cy  Fact Sheet for Healthcare Providers: https://pope.com/  This test is not yet approved or  cleared by the [place] FDA and has been authorized for detection and/or diagnosis of SARS-CoV-2 by FDA under an Emergency Use Authorization (EUA).  This EUA will remain in effect (meaning this test can be used) for the duration of the COVID-19 declaration under Section 564(b)(1) of the Act, 21 U.S.C. section 360bbb-3(b)(1), unless the authorization is terminated or revoked sooner.  Performed at [facility], [address] [place], [place] [address]      Radiology Studies:  No results found.   Scheduled Meds:   . apixaban  5 mg Oral BID  . carvedilol  12.5 mg Oral BID WC  . Ferrous Fumarate  1 tablet Oral Q breakfast   And  . iron polysaccharides  150 mg Oral Q breakfast  . [START ON 06/25/2020] furosemide  40 mg Intravenous Q24H  . hydrocerin   Topical Daily  . insulin aspart  0-9 Units Subcutaneous TID WC  . ipratropium  0.5 mg Nebulization BID  . multivitamin with minerals  1 tablet Oral Daily  . simvastatin  20 mg Oral QHS  . sodium chloride flush   3 mL Intravenous Q12H  . tamsulosin  0.4 mg Oral Daily  . umeclidinium-vilanterol  1 puff Inhalation Daily  . vitamin B-12  1,000 mcg Oral Daily    Continuous Infusions:   . sodium chloride       LOS: 3 days     [facility],  MD, FACP, [facility]. [facility]    To contact the attending provider between 7A-7P or the covering provider during after hours 7P-7A, please log into the web site [email or web address] and access using universal [place] password for that web site. If you do not have the password, please call the hospital operator.  06/24/2020, 6:46 PM

## 2020-06-24 NOTE — Care Management Important Message (Signed)
Important Message  Patient Details  Name: Victor Daniels MRN: [id number] Date of Birth: 1946-02-17   Medicare Important Message Given:  Yes     Johnell Comings 06/24/2020, 2:03 PM

## 2020-06-24 NOTE — Plan of Care (Signed)
  Problem: Clinical Measurements: Goal: Ability to maintain clinical measurements within normal limits will improve Outcome: Progressing Goal: Respiratory complications will improve Outcome: Progressing   Problem: Elimination: Goal: Will not experience complications related to urinary retention Outcome: Progressing   Problem: Safety: Goal: Ability to remain free from injury will improve Outcome: Progressing   Problem: Skin Integrity: Goal: Risk for impaired skin integrity will decrease Outcome: Progressing   

## 2020-06-24 NOTE — Progress Notes (Signed)
Cross Cover Brief Note Nurse reports patient requesting home dose of oxycodone 15 mg every 6 hours.  Concern with high dose narcotic in setting of acute on chronic respiratory failure and new onset heart failure symptoms.  10 mg dose ordered every 6h as needed for his chronic shoulder pain.  Dose adjustment per attending discretion

## 2020-06-25 LAB — GLUCOSE, CAPILLARY
Glucose-Capillary: 89 mg/dL (ref 70–99)
Glucose-Capillary: 111 mg/dL — ABNORMAL HIGH (ref 70–99)
Glucose-Capillary: 113 mg/dL — ABNORMAL HIGH (ref 70–99)

## 2020-06-25 LAB — CBC
HCT: 31.1 % — ABNORMAL LOW (ref 39.0–52.0)
Hemoglobin: 9.2 g/dL — ABNORMAL LOW (ref 13.0–17.0)
MCH: 27.5 pg (ref 26.0–34.0)
MCHC: 29.6 g/dL — ABNORMAL LOW (ref 30.0–36.0)
MCV: 92.8 fL (ref 80.0–100.0)
Platelets: 124 10*3/uL — ABNORMAL LOW (ref 150–400)
RBC: 3.35 MIL/uL — ABNORMAL LOW (ref 4.22–5.81)
RDW: 16.1 % — ABNORMAL HIGH (ref 11.5–15.5)
WBC: 6 10*3/uL (ref 4.0–10.5)
nRBC: 0 % (ref 0.0–0.2)

## 2020-06-25 LAB — BASIC METABOLIC PANEL
Anion gap: 13 (ref 5–15)
BUN: 32 mg/dL — ABNORMAL HIGH (ref 8–23)
CO2: 40 mmol/L — ABNORMAL HIGH (ref 22–32)
Calcium: 9 mg/dL (ref 8.9–10.3)
Chloride: 91 mmol/L — ABNORMAL LOW (ref 98–111)
Creatinine, Ser: 1.23 mg/dL (ref 0.61–1.24)
GFR calc Af Amer: >60 mL/min (ref >60)
GFR calc non Af Amer: 58 mL/min — ABNORMAL LOW (ref >60)
Glucose, Bld: 99 mg/dL (ref 70–99)
Potassium: 3.7 mmol/L (ref 3.5–5.1)
Sodium: 144 mmol/L (ref 135–145)

## 2020-06-25 MED ORDER — CARVEDILOL 6.25 MG PO TABS
6.2500 mg | ORAL_TABLET | Freq: Two times a day (BID) | ORAL | Status: DC
  Administered 2020-06-25: 6.25 mg via ORAL

## 2020-06-25 MED ORDER — LOSARTAN POTASSIUM 25 MG PO TABS: 25.0000 mg | ORAL_TABLET | Freq: Every day | ORAL | 0 refills | Status: DC

## 2020-06-25 MED ORDER — APIXABAN 5 MG PO TABS: 5.0000 mg | ORAL_TABLET | Freq: Two times a day (BID) | ORAL | 0 refills | Status: DC

## 2020-06-25 MED ORDER — POTASSIUM CHLORIDE CRYS ER 20 MEQ PO TBCR: 20.0000 meq | EXTENDED_RELEASE_TABLET | Freq: Every day | ORAL | 0 refills | Status: DC

## 2020-06-25 MED ORDER — POTASSIUM CHLORIDE CRYS ER 20 MEQ PO TBCR: 20.0000 meq | EXTENDED_RELEASE_TABLET | Freq: Every day | ORAL | Status: DC

## 2020-06-25 MED ORDER — FUROSEMIDE 40 MG PO TABS: 40.0000 mg | ORAL_TABLET | Freq: Every day | ORAL | Status: DC

## 2020-06-25 MED ORDER — FUROSEMIDE 40 MG PO TABS
40.0000 mg | ORAL_TABLET | Freq: Every day | ORAL | 0 refills | Status: DC
Start: 2020-06-25 — End: 2020-07-29

## 2020-06-25 MED ORDER — CARVEDILOL 6.25 MG PO TABS: 6.2500 mg | ORAL_TABLET | Freq: Two times a day (BID) | ORAL | 0 refills | Status: DC

## 2020-06-25 MED ORDER — HYDROCERIN EX CREA: 1.0000 "application " | TOPICAL_CREAM | Freq: Every day | CUTANEOUS | 0 refills | Status: DC

## 2020-06-25 MED ORDER — LOSARTAN POTASSIUM 25 MG PO TABS
25.0000 mg | ORAL_TABLET | Freq: Every day | ORAL | Status: DC
  Administered 2020-06-25: 25 mg via ORAL
  Filled 2020-06-25: qty 1

## 2020-06-25 MED ORDER — POTASSIUM CHLORIDE CRYS ER 20 MEQ PO TBCR
40.0000 meq | EXTENDED_RELEASE_TABLET | Freq: Once | ORAL | Status: AC
  Administered 2020-06-25: 40 meq via ORAL
  Filled 2020-06-25: qty 2

## 2020-06-25 NOTE — Discharge Summary (Signed)
Physician Discharge Summary  Victor Daniels:[id number] DOB: August 15, 1946  PCP: [name], MD  Admitted from: Home Discharged to: Home  Admit date: 06/21/2020 Discharge date: 06/25/2020  Recommendations for Outpatient Follow-up:    Follow-up Information    [facility] Follow up on 07/23/2020.   Specialty: Cardiology Why: at 1:00pm. Enter through the Medical Mall entrance Contact information: [address] [place] [address] [phone]       [name], [facility] Follow up.   Specialty: Home Health Services Why: Physical therapy, Occupational Therapy, Nurse Contact information: [address] [place] [address] [phone]        [name], MD. Schedule an appointment as soon as possible for a visit in 4 day(s).   Specialty: Internal Medicine Why: To be seen with repeat labs (CBC & BMP).  Patient also advised to keep up the nephrology appointment he says he has with Dr. [name] upcoming shortly within a week's time. Contact information: [address] [place] [address] [phone]        [name], MD. Schedule an appointment as soon as possible for a visit.   Specialty: Cardiology Contact information: [address] [place] [address] [phone]                Home Health: PT, OT and RN Equipment/Devices: None.  Uses home oxygen 2 L/min, continue  Discharge Condition: Improved and stable. CODE STATUS: Full. Diet recommendation: Heart healthy & diabetic diet.  Discharge Diagnoses:  Principal Problem:   Acute on chronic diastolic CHF (congestive heart failure) (HCC) Active Problems:   Hypertension   Venous insufficiency (chronic) (peripheral)   Venous insufficiency   Obesity   Obstructive sleep apnea   CHF (congestive heart failure), NYHA class I, acute on chronic, diastolic (HCC)    Swelling   Atrial fibrillation (HCC)   Brief Summary: 74 year old married male, independent, PMH of COPD, chronic respiratory failure with hypoxia on home oxygen 2 L/min, chronic diastolic CHF, diet-controlled DM 2, OSA, CAD, CVA x2, prior alcoholic cardiomyopathy and EF normalized, presented to the ED due to worsening dyspnea, orthopnea, worsening bilateral lower extremity edema and mostly nonproductive cough.  Per EMS patient was diffusely wheezing and received Solu-Medrol and duo nebs in the field.  Admitted for acute on chronic diastolic CHF, acute on chronic respiratory failure with hypoxia.  Diuresed with IV Lasix, course complicated by AKI.  Noted A. fib with variable but controlled ventricular rate on 6/30,?  New.  Cardiology consulted.   Assessment & Plan:   Acute on chronic diastolic CHF: May have been precipitated by A. fib.  Not sure if this is new versus old.  Patient reports that he has had it although not on anticoagulation.  Reviewing last cardiology note in 2019 did not reveal A. fib documentation.  TTE 6/29: LVEF >55%, LV diastolic parameters indeterminate.    Cardiology was consulted.  Treated with IV Lasix 40 mg every 12 hours.  -7.7 L thus far.  As discussed with cardiology and as per their note, patient still has significant volume overload and ideally should remain in the hospital for continued IV diuresis but he insists on being discharged to care for his wife at home.  They have thus cleared him for discharge home on Lasix 40 mg orally daily along with potassium 20 mEq orally daily for 3 to 4 days until  early follow-up with his PCP early next week at which time it can be determined if his Lasix dose needs to be cut down.  Also started on low-dose losartan instead of Hyzaar which can be uptitrated as outpatient.  Carvedilol dose was reduced from 12.5 to 6.25 mg twice daily.    Acute on chronic hypoxic respiratory failure: Precipitated by decompensated CHF on top of underlying  COPD and untreated OSA.  Patient is now back to his home level of oxygen 2 L/min.  A. fib with CVR: Today on telemetry noted patient with A. fib with variable but controlled ventricular rate.  Although patient indicates that he has had it in the past, did not find this in the last cardiology office notes and patient not on home anticoagulation.  Cardiology input appreciated. Eliquis initiated yesterday.  Continue carvedilol.  Acute kidney injury complicating stage IIIa CKD: Likely related to aggressive diuresis with ongoing use of ARB.  ARB held.    Briefly held IV Lasix and was reinitiated.  Patient reportedly has a follow-up appointment with nephrology/Dr. [name] in about 2 weeks time. Creatinine has slightly improved to 1.23 suggesting some of this may be cardiorenal.  OSA: Noncompliant with CPAP.  Outpatient follow-up with PCP and may need to repeat sleep study.  Lower extremity chronic venous stasis: Reportedly uses TED hose at home.  DVT ruled out.  Placed compression stockings to mobilize fluids.  LEV Dopplers negative for DVT. Now has bilateral knee-high TED hoses.  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  Reasonable inpatient control.  Continue dietary management as outpatient  Essential hypertension:  Amlodipine discontinued.  Hyzaar discontinued.  Low-dose losartan started today and carvedilol dose cut by half at discharge.  Outpatient follow-up.    Anemia: May be multifactorial due to chronic kidney disease and chronic disease.  Periodically follow CBCs.  Stable.  Hypokalemia: Replaced   Body mass index is 40.06 kg/m./Morbid obesity  Thrombocytopenia: Stable.  Outpatient follow-up.      Consultants:   Cardiology  Procedures:   None  Discharge Instructions  Discharge Instructions    (HEART FAILURE PATIENTS) Call MD:  Anytime you have any of the following symptoms: 1) 3 pound weight gain in 24 hours or 5 pounds in 1 week 2)  shortness of breath, with or without a dry hacking cough 3) swelling in the hands, feet or stomach 4) if you have to sleep on extra pillows at night in order to breathe.   Complete by: As directed    Call MD for:  difficulty breathing, headache or visual disturbances   Complete by: As directed    Call MD for:  extreme fatigue   Complete by: As directed    Call MD for:  persistant dizziness or light-headedness   Complete by: As directed    Call MD for:  persistant nausea and vomiting   Complete by: As directed    Call MD for:  redness, tenderness, or signs of infection (pain, swelling, redness, odor or green/yellow discharge around incision site)   Complete by: As directed    Call MD for:  severe uncontrolled pain   Complete by: As directed    Call MD for:  temperature >100.4   Complete by: As directed    Diet - low sodium heart healthy   Complete by: As directed    Diet Carb Modified   Complete by: As directed    Increase activity slowly   Complete by: As directed  Medication List    STOP taking these medications   amLODipine 5 MG tablet Commonly known as: NORVASC   ipratropium 0.03 % nasal spray Commonly known as: ATROVENT   ipratropium-albuterol 0.5-2.5 (3) MG/3ML Soln Commonly known as: DUONEB   levofloxacin 250 MG tablet Commonly known as: LEVAQUIN   losartan-hydrochlorothiazide 100-25 MG tablet Commonly known as: HYZAAR   methocarbamol 500 MG tablet Commonly known as: ROBAXIN   oxyCODONE 15 MG immediate release tablet Commonly known as: ROXICODONE     TAKE these medications   apixaban 5 MG Tabs tablet Commonly known as: ELIQUIS Take 1 tablet (5 mg total) by mouth 2 (two) times daily.   carvedilol 6.25 MG tablet Commonly known as: COREG Take 1 tablet (6.25 mg total) by mouth 2 (two) times daily with a meal. What changed:   medication strength  how much to take   furosemide 40 MG tablet Commonly known as: LASIX Take 1 tablet (40 mg total) by  mouth daily. What changed:   medication strength  how much to take  when to take this   hydrocerin Crea Apply 1 application topically daily. Apply Eucerin to bilat legs Q day after washing with soap and water and roughly towel-drying to assist with removal of loose skin. Start taking on: June 26, 2020   losartan 25 MG tablet Commonly known as: COZAAR Take 1 tablet (25 mg total) by mouth daily. Start taking on: June 26, 2020   multivitamin capsule Take 1 capsule by mouth daily.   nitroGLYCERIN 0.4 MG SL tablet Commonly known as: NITROSTAT Place 1 tablet (0.4 mg total) under the tongue every 5 (five) minutes as needed for chest pain. Maximum dose 3 tablets   OXYGEN Inhale 2 L into the lungs 3 (three) times daily as needed (shortness of breath).   potassium chloride SA 20 MEQ tablet Commonly known as: KLOR-CON Take 1 tablet (20 mEq total) by mouth daily. Start taking on: June 26, 2020   simvastatin 20 MG tablet Commonly known as: ZOCOR Take 1 tablet (20 mg total) by mouth at bedtime.   tamsulosin 0.4 MG Caps capsule Commonly known as: FLOMAX Take 1 capsule (0.4 mg total) by mouth daily.   Tandem 162-115.2 MG Caps capsule Generic drug: ferrous fumarate-iron polysaccharide complex Take 1 capsule by mouth daily with breakfast.   Trelegy Ellipta 100-62.5-25 MCG/INH Aepb Generic drug: Fluticasone-Umeclidin-Vilant Inhale 1 puff into the lungs daily.   Ventolin HFA 108 (90 Base) MCG/ACT inhaler Generic drug: albuterol INHALE 2 PUFFS EVERY 6 HOURS AS NEEDED FOR WHEEZING OR SHORTNESS OF BREATH   vitamin B-12 1000 MCG tablet Commonly known as: CYANOCOBALAMIN Take 1,000 mcg by mouth daily.      No Known Allergies    Procedures/Studies: US Venous Img Lower Bilateral (DVT)  Result Date: 06/21/2020 CLINICAL DATA:  Bilateral lower extremity pain and edema for several years. History of previous DVT. Evaluate for acute or chronic DVT. EXAM: BILATERAL LOWER EXTREMITY VENOUS  DOPPLER ULTRASOUND TECHNIQUE: Gray-scale sonography with graded compression, as well as color Doppler and duplex ultrasound were performed to evaluate the lower extremity deep venous systems from the level of the common femoral vein and including the common femoral, femoral, profunda femoral, popliteal and calf veins including the posterior tibial, peroneal and gastrocnemius veins when visible. The superficial great saphenous vein was also interrogated. Spectral Doppler was utilized to evaluate flow at rest and with distal augmentation maneuvers in the common femoral, femoral and popliteal veins. COMPARISON:  None. FINDINGS: RIGHT LOWER EXTREMITY  Common Femoral Vein: No evidence of thrombus. Normal compressibility, respiratory phasicity and response to augmentation. Saphenofemoral Junction: No evidence of thrombus. Normal compressibility and flow on color Doppler imaging. Profunda Femoral Vein: No evidence of thrombus. Normal compressibility and flow on color Doppler imaging. Femoral Vein: No evidence of thrombus. Normal compressibility, respiratory phasicity and response to augmentation. Popliteal Vein: No evidence of thrombus. Normal compressibility, respiratory phasicity and response to augmentation. Calf Veins: No evidence of thrombus. Normal compressibility and flow on color Doppler imaging. Superficial Great Saphenous Vein: No evidence of thrombus. Normal compressibility. Venous Reflux:  None. Other Findings:  None. LEFT LOWER EXTREMITY Common Femoral Vein: No evidence of thrombus. Normal compressibility, respiratory phasicity and response to augmentation. Saphenofemoral Junction: No evidence of thrombus. Normal compressibility and flow on color Doppler imaging. Profunda Femoral Vein: No evidence of thrombus. Normal compressibility and flow on color Doppler imaging. Femoral Vein: No evidence of thrombus. Normal compressibility, respiratory phasicity and response to augmentation. Popliteal Vein: No evidence of  thrombus. Normal compressibility, respiratory phasicity and response to augmentation. Calf Veins: Appear patent where imaged. Superficial Great Saphenous Vein: No evidence of thrombus. Normal compressibility. Venous Reflux:  None. Other Findings:  None. IMPRESSION: No evidence of DVT within either lower extremity. Electronically Signed   By: Simonne Come M.D.   On: 06/21/2020 14:21   DG Chest Portable 1 View  Result Date: 06/21/2020 CLINICAL DATA:  Shortness of breath EXAM: PORTABLE CHEST 1 VIEW COMPARISON:  May 21, 2020 FINDINGS: There is a right pleural effusion with right base consolidation. There is cardiomegaly with mild pulmonary venous hypertension. There is interstitial pulmonary edema. No evident adenopathy. No bone lesions IMPRESSION: Cardiomegaly with pulmonary vascular congestion and interstitial edema. Right pleural effusion. These are findings felt to be indicative of a degree of congestive heart failure. There is suspected superimposed pneumonia right base. Electronically Signed   By: Bretta Bang III M.D.   On: 06/21/2020 08:06   ECHOCARDIOGRAM COMPLETE  Result Date: 06/22/2020    ECHOCARDIOGRAM REPORT   Patient Name:   Victor Daniels Date of Exam: 06/22/2020 Medical Rec #:  [id number]             Height:       72.0 in Accession #:    [id number]            Weight:       299.0 lb Date of Birth:  1946-01-06            BSA:          2.528 m Patient Age:    73 years              BP:           121/86 mmHg Patient Gender: M                     HR:           56 bpm. Exam Location:  [facility] Procedure: 2D Echo, Color Doppler, Cardiac Doppler and Intracardiac            Opacification Agent Indications:     I50.31 CHF-Acute Diastolic  History:         Patient has prior history of Echocardiogram examinations. CHF,                  Previous Myocardial Infarction and CAD, Stroke, PAD, COPD and  PAD; Risk Factors:Sleep Apnea, Hypertension, Diabetes and                  Dyslipidemia.   Sonographer:     Humphrey Rolls RDCS (AE) Referring Phys:  [id number] Lucile Shutters Diagnosing Phys: Yvonne Kendall MD  Sonographer Comments: Technically challenging study due to limited acoustic windows. Image acquisition challenging due to patient body habitus and Image acquisition challenging due to COPD. IMPRESSIONS  1. Left ventricular ejection fraction, by estimation, is >55%. The left ventricle has normal function. Left ventricular endocardial border not optimally defined to evaluate regional wall motion. Left ventricular diastolic parameters are indeterminate.  2. Right ventricular systolic function was not well visualized. The right ventricular size is not well visualized.  3. The mitral valve was not well visualized. Unable to assess mitral valve regurgitation.  4. Tricuspid valve regurgitation not well visualized.  5. The aortic valve was not well visualized. Aortic valve regurgitation not well assessed. No aortic stenosis is present.  6. Pulmonic valve regurgitation not well visualized.  7. The inferior vena cava is dilated in size with <50% respiratory variability, suggesting right atrial pressure of 15 mmHg. FINDINGS  Left Ventricle: Left ventricular dimensions are not well characterized. Left ventricular ejection fraction, by estimation, is >55%. The left ventricle has normal function. Left ventricular endocardial border not optimally defined to evaluate regional wall motion. Definity contrast agent was given IV to delineate the left ventricular endocardial borders. Left ventricular diastolic parameters are indeterminate. Right Ventricle: The right ventricular size is not well visualized. Right vetricular wall thickness was not assessed. Right ventricular systolic function was not well visualized. Left Atrium: Left atrial size was not well visualized. Right Atrium: Right atrial size was not well visualized. Pericardium: There is no evidence of pericardial effusion. Mitral Valve: The mitral valve was not  well visualized. Unable to assess mitral valve regurgitation. MV peak gradient, 8.2 mmHg. The mean mitral valve gradient is 2.0 mmHg. Tricuspid Valve: The tricuspid valve is not well visualized. Tricuspid valve regurgitation not well visualized. Aortic Valve: The aortic valve was not well visualized. Aortic valve regurgitation not well assessed. No aortic stenosis is present. Aortic valve mean gradient measures 6.0 mmHg. Aortic valve peak gradient measures 11.8 mmHg. Pulmonic Valve: The pulmonic valve was not well visualized. Pulmonic valve regurgitation not well visualized. Aorta: The aortic root was not well visualized. Pulmonary Artery: The pulmonary artery is not well seen. Venous: The inferior vena cava is dilated in size with less than 50% respiratory variability, suggesting right atrial pressure of 15 mmHg. IAS/Shunts: No atrial level shunt detected by color flow Doppler.   Diastology LV e' lateral:   12.10 cm/s LV E/e' lateral: 11.0 LV e' medial:    7.07 cm/s LV E/e' medial:  18.9  AORTIC VALVE AV Vmax:           172.00 cm/s AV Vmean:          107.000 cm/s AV VTI:            0.337 m AV Peak Grad:      11.8 mmHg AV Mean Grad:      6.0 mmHg LVOT Vmax:         130.00 cm/s LVOT Vmean:        82.300 cm/s LVOT VTI:          0.259 m LVOT/AV VTI ratio: 0.77 MITRAL VALVE MV Area (PHT): 3.05 cm     SHUNTS MV Peak grad:  8.2 mmHg  Systemic VTI: 0.26 m MV Mean grad:  2.0 mmHg MV Vmax:       1.43 m/s MV Vmean:      63.9 cm/s MV Decel Time: 249 msec MV E velocity: 133.67 cm/s Yvonne Kendall MD Electronically signed by Yvonne Kendall MD Signature Date/Time: 06/22/2020/1:48:00 PM    Final       Subjective: Insists on going home.  He is much improved and almost back to baseline.  Leg swelling improving.  Denies any other complaints.  Discharge Exam:  Vitals:   06/25/20 0426 06/25/20 0736 06/25/20 0742 06/25/20 1149  BP: 130/80  136/85 137/78  Pulse: 97  (!) 54 61  Resp:   18 18  Temp: 98.7 F (37.1 C)   98.6 F (37 C) 97.8 F (36.6 C)  TempSrc: Oral     SpO2: 93% 94% 100% 99%  Weight:      Height:        General exam: Elderly male, moderately built and obese sitting up comfortably in chair without distress Respiratory system: Occasional basal crackles but otherwise clear to auscultation without wheezing or rhonchi.  No increased work of breathing. Cardiovascular system: S1 & S2 heard, irregularly irregular. No JVD, murmurs, rubs, gallops or clicks. Leg edema better after TED hoses, at least 1+.  Telemetry personally reviewed: A. fib with variable but controlled ventricular rate, mostly in the 60s-occasionally in the 50s. Gastrointestinal system: Abdomen is nondistended, soft and nontender. No organomegaly or masses felt. Normal bowel sounds heard. Central nervous system: Alert and oriented. No focal neurological deficits. Extremities: Symmetric 5 x 5 power. Skin: Changes of chronic venous stasis of bilateral legs without acute findings. Psychiatry: Judgement and insight appear normal. Mood & affect appropriate.     The results of significant diagnostics from this hospitalization (including imaging, microbiology, ancillary and laboratory) are listed below for reference.     Microbiology: Recent Results (from the past 240 hour(s))  Culture, blood (routine x 2)     Status: None (Preliminary result)   Collection Time: 06/21/20  9:32 AM   Specimen: BLOOD  Result Value Ref Range Status   Specimen Description BLOOD LEFT ANTECUBITAL  Final   Special Requests   Final    BOTTLES DRAWN AEROBIC AND ANAEROBIC Blood Culture adequate volume   Culture   Final    NO GROWTH 4 DAYS Performed at [facility], [address] [place], [place] [address]    Report Status PENDING  Incomplete  Culture, blood (routine x 2)     Status: None (Preliminary result)   Collection Time: 06/21/20  9:32 AM   Specimen: BLOOD  Result Value Ref Range Status   Specimen Description BLOOD LEFT ARM  Final    Special Requests   Final    BOTTLES DRAWN AEROBIC AND ANAEROBIC Blood Culture adequate volume   Culture   Final    NO GROWTH 4 DAYS Performed at [facility], [address], [place], [place] [address]    Report Status PENDING  Incomplete  SARS Coronavirus 2 by RT PCR (hospital order, performed in [facility] hospital lab) Nasopharyngeal Urine, Clean Catch     Status: None   Collection Time: 06/21/20 10:22 AM   Specimen: Urine, Clean Catch; Nasopharyngeal  Result Value Ref Range Status   SARS Coronavirus 2 NEGATIVE NEGATIVE Final    Comment: (NOTE) SARS-CoV-2 target nucleic acids are NOT DETECTED.  The SARS-CoV-2 RNA is generally detectable in upper and lower respiratory specimens during the acute phase  of infection. The lowest concentration of SARS-CoV-2 viral copies this assay can detect is 250 copies / mL. A negative result does not preclude SARS-CoV-2 infection and should not be used as the sole basis for treatment or other patient management decisions.  A negative result may occur with improper specimen collection / handling, submission of specimen other than nasopharyngeal swab, presence of viral mutation(s) within the areas targeted by this assay, and inadequate number of viral copies (<250 copies / mL). A negative result must be combined with clinical observations, patient history, and epidemiological information.  Fact Sheet for Patients:   BoilerBrush.com.cy  Fact Sheet for Healthcare Providers: https://pope.com/  This test is not yet approved or  cleared by the [place] FDA and has been authorized for detection and/or diagnosis of SARS-CoV-2 by FDA under an Emergency Use Authorization (EUA).  This EUA will remain in effect (meaning this test can be used) for the duration of the COVID-19 declaration under Section 564(b)(1) of the Act, 21 U.S.C. section 360bbb-3(b)(1), unless the authorization is  terminated or revoked sooner.  Performed at [facility], [address] [place], [place] [address]      Labs: CBC: Recent Labs  Lab 06/21/20 0749 06/24/20 0445 06/25/20 0457  WBC 11.2* 6.9 6.0  NEUTROABS 8.6*  --   --   HGB 9.9* 9.0* 9.2*  HCT 33.0* 30.1* 31.1*  MCV 90.7 92.3 92.8  PLT 158 120* 124*    Basic Metabolic Panel: Recent Labs  Lab 06/21/20 0749 06/22/20 0538 06/23/20 0526 06/24/20 0445 06/25/20 0457  NA 143 141 144 143 144  K 3.6 3.7 3.4* 3.7 3.7  CL 92* 91* 90* 91* 91*  CO2 36* 38* 42* 45* 40*  GLUCOSE 144* 150* 123* 120* 99  BUN 27* 32* 38* 35* 32*  CREATININE 1.41* 1.18 1.40* 1.30* 1.23  CALCIUM 9.2 9.0 9.1 8.9 9.0    Liver Function Tests: Recent Labs  Lab 06/21/20 0749  AST 19  ALT 17  ALKPHOS 85  BILITOT 1.1  PROT 7.2  ALBUMIN 3.1*    CBG: Recent Labs  Lab 06/24/20 1107 06/24/20 1613 06/24/20 2118 06/25/20 0744 06/25/20 1148  GLUCAP 127* 110* 167* 89 111*    Hgb A1c Recent Labs    06/23/20 0527  HGBA1C 6.3*    Thyroid function studies Recent Labs    06/23/20 0526  TSH 2.088     Urinalysis    Component Value Date/Time   COLORURINE STRAW (A) 06/21/2020 0750   APPEARANCEUR CLEAR (A) 06/21/2020 0750   APPEARANCEUR Clear 10/09/2019 1430   LABSPEC 1.004 (L) 06/21/2020 0750   LABSPEC 1.013 04/02/2015 1556   PHURINE 7.0 06/21/2020 0750   GLUCOSEU NEGATIVE 06/21/2020 0750   GLUCOSEU Negative 04/02/2015 1556   GLUCOSEU NEGATIVE 08/20/2014 0907   HGBUR NEGATIVE 06/21/2020 0750   BILIRUBINUR NEGATIVE 06/21/2020 0750   BILIRUBINUR Negative 10/09/2019 1430   BILIRUBINUR Negative 04/02/2015 1556   KETONESUR NEGATIVE 06/21/2020 0750   PROTEINUR NEGATIVE 06/21/2020 0750   UROBILINOGEN 0.2 08/20/2014 0926   UROBILINOGEN 0.2 08/20/2014 0907   NITRITE NEGATIVE 06/21/2020 0750   LEUKOCYTESUR NEGATIVE 06/21/2020 0750   LEUKOCYTESUR Negative 04/02/2015 1556    Patient declined MDs offered to speak with spouse  or family.  Time coordinating discharge: 45 minutes  SIGNED:  Marcellus Scott, MD, FACP, [facility]. [facility]  To contact the attending provider between 7A-7P or the covering provider during after hours 7P-7A, please log into the web site [email or web address] and access using universal  [place] password for that web site. If you do not have the password, please call the hospital operator.
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
## 2020-06-25 NOTE — Progress Notes (Signed)
Discharge instructions explained to pt / verbalized an understanding/ iv and tele removed/ will transport off unit via wheelchair when ride arrives.  

## 2020-06-25 NOTE — Progress Notes (Signed)
Patient c/o of 8/10 arthritic pain in right shoulder. Patient reports that he takes 15 mg of oxycodone every 6 hours as needed. Jon Billings NP notified. Provider to place orders. Will continue to monitor.

## 2020-06-25 NOTE — Progress Notes (Signed)
AM dose of Coreg held due to low HR / Dr. Mariah Milling made aware/ medication adjustments made/ will monitor.

## 2020-06-26 LAB — CULTURE, BLOOD (ROUTINE X 2)
Culture: NO GROWTH
Culture: NO GROWTH
Special Requests: ADEQUATE
Special Requests: ADEQUATE

## 2020-06-29 ENCOUNTER — Telehealth: Payer: Self-pay

## 2020-06-29 ENCOUNTER — Other Ambulatory Visit: Payer: Medicare HMO

## 2020-06-29 ENCOUNTER — Other Ambulatory Visit: Payer: Self-pay | Admitting: *Deleted

## 2020-06-29 ENCOUNTER — Other Ambulatory Visit: Payer: Self-pay

## 2020-06-29 DIAGNOSIS — I5033 Acute on chronic diastolic (congestive) heart failure: Secondary | ICD-10-CM

## 2020-06-29 NOTE — Telephone Encounter (Signed)
Unsuccessful attempt to reach patient for transition of care. No answer. Left message stating the nature of call and I would call again tomorrow. Currently scheduled hospital follow up with PMD 07/01/20 @ 11:00. Keep all scheduled appointments.

## 2020-06-29 NOTE — Patient Outreach (Signed)
[facility]) Care Management  06/29/2020  Victor Daniels 07/06/1946 [id number]   Transition of care by PCP    Referral Date  Referral source: [employer] discharge notification  Date of Admission : 06/21/20 Diagnosis : Acute on chronic heart failure , atrial fib  Date of Discharge : 06/25/20 Facility : [facility]  Insurance: [employer]   Social: Patient reports living at home with his wife that is able to assist in his care. His wife provides transportation to his appointments. She does the errands and helps with keeping track of all to his appointments. Discussed [employer] benefit of transportation, patient asked if they waited on him during appointment , explained process he states they are good with transportation.  Patient has discharge orders for Home Health with [facility] he reports receiving phone call from agency he has not had initial visit yet bu was told it may take a few days due to the holiday.  Patient reports using walker in home for mobility, he mostly does a sponge bath daily, stating he doesn't trust getting into shower even with having shower chair and low shower entrance. Patient making statement that he wishes he had someone to come in to help his wife with everything that she has do for him. He is asking about resources in area, he understands that he may have to pay out of pocket.   Conditions Patient discussed recent admission with heart failure and new rhythm problem Atrial fib. He reports weight on today 289, ( Discharge weight 288.8 lbs) . Patient discussed ongoing issue with swelling/weeping  in lower legs, he discussed using special cream and wearing support/compression socks at home. He discussed also being followed at wound center . He denies worsening swelling, in lower legs.  Patient reports wearing oxygen at 2 liters at home and checking his oxygen saturation , with reading above 90%. He denies increase in shortness of breath,  cough or sputum productions. He reports using respiratory medications as prescribed.   Medications  Patient reports having all medications and with his wife able to organize and take as prescribed. He denies cost concerns. He discussed being followed by [facility] pharmacist Catie Feliz Beam and appreciates her support.   Appointment  Dr. Darrick Huntsman 07/01/20 Dr. Logan Bores, Podiatry 07/02/20  Dr. Leanord Hawking , wound clinic 7/14 Dr. Lonna Cobb , urology on 7/16 [facility] , Heart Failure Clinic 7/30   Consent  Explained and offered [facility] care management services for education support of managing chronic conditions of Heart failure, Atrial  Fib ,COPD .  Advanced Directive  Patient does not have, explained what Advanced directive is , he declined additional information .    Plan Will send patient [facility] welcome letter, [facility] calendar , atrial fib/heart failure emmi handout.Will request CMA to send resource information for personal care services in [facility].  Send [facility] barrier involvement letter    [name], RN, BSN  [facility] Care Management,Care Management Coordinator  [phone]- Mobile [phone]- Toll Free Main Office

## 2020-06-30 ENCOUNTER — Telehealth: Payer: Self-pay | Admitting: Family

## 2020-06-30 NOTE — Telephone Encounter (Signed)
Transition Care Management Follow-up Telephone Call  Date of discharge and from where: 7/2 from [facility]  How have you been since you were released from the hospital? "The fluid in my legs have gone down,wearing compression socks, rotating appropriately and using cream as directed. My legs look good." Cpap not in use. Oxygen 2-2.5L continuously. Dry, non productive cough. Denies uncontrolled pain, fever, nausea, vomiting, diarrhea, headache, visual disturbances, weight gain.  Any questions or concerns? No   Items Reviewed:  Did the pt receive and understand the discharge instructions provided? Yes , do not strain or lift heavy items, increase activity as tolerated, weigh daily (not to gain more than 3lbs in 24 hr or 5lb in 1 week) and contact PMD should this occur.   Medications obtained and verified? Yes , managed by wife, no issues.   Any new allergies since your discharge? No   Dietary orders reviewed? Yes, low sodium, heart healthy, carb modified  Do you have support at home? Yes , wife  Functional Questionnaire: (I = Independent and D = Dependent) ADLs: i  Bathing/Dressing- i  Meal Prep- d- wife  Eating- i  Maintaining continence- i  Transferring/Ambulation- d- walker, cane  Managing Meds- d- wife manages  Follow up appointments reviewed:   PCP Hospital f/u appt confirmed? Yes  Scheduled to see Dr. Darrick Huntsman on 07/01/20 @ 11:00.  Specialist Hospital f/u appt confirmed? Yes  Scheduled to see Home Health PT/OT starting next week, Cardiology 7/30, Nephrology (does not recall the date at this moment), Podiatry 7/9.    Are transportation arrangements needed? No   If their condition worsens, is the pt aware to call PCP or go to the Emergency Dept.? Yes  Was the patient provided with contact information for the PCP's office or ED? Yes  Was to pt encouraged to call back with questions or concerns? Yes

## 2020-06-30 NOTE — Telephone Encounter (Signed)
Spoke to patient regarding his recent hospital discharge. Patient said he is doing well since he got home. No symptoms or complaints at this time, is checking weight daily, and following a low sodium diet. He is also staying active around the house as much as possible with a walker. He is not 100% sure he can keep his new patient CHF Clinic appointment with [place] for 7/30 but is going to follow up with his wife and let us know.   Lavarius Doughten, NT

## 2020-07-01 ENCOUNTER — Encounter: Payer: Self-pay | Admitting: Internal Medicine

## 2020-07-01 ENCOUNTER — Ambulatory Visit: Payer: Medicare HMO | Admitting: Physician Assistant

## 2020-07-01 ENCOUNTER — Other Ambulatory Visit: Payer: Medicare HMO

## 2020-07-01 ENCOUNTER — Ambulatory Visit (INDEPENDENT_AMBULATORY_CARE_PROVIDER_SITE_OTHER): Payer: Medicare HMO | Admitting: Internal Medicine

## 2020-07-01 ENCOUNTER — Other Ambulatory Visit: Payer: Self-pay

## 2020-07-01 VITALS — BP 130/76 | HR 56 | Temp 98.0°F | Resp 18 | Ht 72.0 in | Wt 287.0 lb

## 2020-07-01 DIAGNOSIS — Z09 Encounter for follow-up examination after completed treatment for conditions other than malignant neoplasm: Secondary | ICD-10-CM | POA: Diagnosis not present

## 2020-07-01 DIAGNOSIS — E876 Hypokalemia: Secondary | ICD-10-CM | POA: Diagnosis not present

## 2020-07-01 DIAGNOSIS — D649 Anemia, unspecified: Secondary | ICD-10-CM

## 2020-07-01 DIAGNOSIS — I5030 Unspecified diastolic (congestive) heart failure: Secondary | ICD-10-CM

## 2020-07-01 DIAGNOSIS — I480 Paroxysmal atrial fibrillation: Secondary | ICD-10-CM | POA: Diagnosis not present

## 2020-07-01 DIAGNOSIS — I872 Venous insufficiency (chronic) (peripheral): Secondary | ICD-10-CM

## 2020-07-01 DIAGNOSIS — D6869 Other thrombophilia: Secondary | ICD-10-CM

## 2020-07-01 LAB — CBC WITH DIFFERENTIAL/PLATELET
Basophils Absolute: 0 10*3/uL (ref 0.0–0.1)
Basophils Relative: 0.8 % (ref 0.0–3.0)
Eosinophils Absolute: 0.2 10*3/uL (ref 0.0–0.7)
Eosinophils Relative: 4.1 % (ref 0.0–5.0)
HCT: 29.2 % — ABNORMAL LOW (ref 39.0–52.0)
Hemoglobin: 9.3 g/dL — ABNORMAL LOW (ref 13.0–17.0)
Lymphocytes Relative: 19.7 % (ref 12.0–46.0)
Lymphs Abs: 1.2 10*3/uL (ref 0.7–4.0)
MCHC: 31.9 g/dL (ref 30.0–36.0)
MCV: 88.3 fl (ref 78.0–100.0)
Monocytes Absolute: 0.7 10*3/uL (ref 0.1–1.0)
Monocytes Relative: 11.5 % (ref 3.0–12.0)
Neutro Abs: 3.9 10*3/uL (ref 1.4–7.7)
Neutrophils Relative %: 63.9 % (ref 43.0–77.0)
Platelets: 163 10*3/uL (ref 150.0–400.0)
RBC: 3.31 Mil/uL — ABNORMAL LOW (ref 4.22–5.81)
RDW: 17.1 % — ABNORMAL HIGH (ref 11.5–15.5)
WBC: 6.1 10*3/uL (ref 4.0–10.5)

## 2020-07-01 LAB — BASIC METABOLIC PANEL
BUN: 21 mg/dL (ref 6–23)
CO2: 42 mEq/L — ABNORMAL HIGH (ref 19–32)
Calcium: 8.9 mg/dL (ref 8.4–10.5)
Chloride: 96 mEq/L (ref 96–112)
Creatinine, Ser: 1.21 mg/dL (ref 0.40–1.50)
GFR: 58.67 mL/min — ABNORMAL LOW (ref >60.00)
Glucose, Bld: 91 mg/dL (ref 70–99)
Potassium: 4 mEq/L (ref 3.5–5.1)
Sodium: 143 mEq/L (ref 135–145)

## 2020-07-01 NOTE — Progress Notes (Signed)
Subjective:  Patient ID: Victor Daniels, male    DOB: Jul 10, 1946  Age: 74 y.o. MRN: [id number]  CC: The primary encounter diagnosis was Anemia, unspecified type. Diagnoses of Paroxysmal atrial fibrillation (HCC), Hypokalemia, Venous insufficiency (chronic) (peripheral), CHF with left ventricular diastolic dysfunction, NYHA class 2 [facility]), Hospital discharge follow-up, and Acquired thrombophilia (HCC) were also pertinent to this visit.  HPI [name] presents for hospital follow up  This visit occurred during the SARS-CoV-2 public health emergency.  Safety protocols were in place, including screening questions prior to the visit, additional usage of staff PPE, and extensive cleaning of exam room while observing appropriate contact time as indicated for disinfecting solutions.   Admitted to [facility] on June 28 with pulmonary edema and acute on chronic hypoxic respiratory failure.  Seen by [facility],  ECHO done.  Diuresed and discharged on July 2   1) acute on chronic diastolic CHF: may have been triggered by PAF .  Now on Eliquis . Refused to stay in house longer than July 2  Dc on lasix 40  meds changed:  Amlodipine and hyzaar stopped.  Losartan and carvedilol started   Still coughing most nonproductive,  Moist sounding "annoying, but not aggravated by lying supine    Outpatient Medications Prior to Visit  Medication Sig Dispense Refill  . apixaban (ELIQUIS) 5 MG TABS tablet Take 1 tablet (5 mg total) by mouth 2 (two) times daily. 60 tablet 0  . carvedilol (COREG) 6.25 MG tablet Take 1 tablet (6.25 mg total) by mouth 2 (two) times daily with a meal. 60 tablet 0  . FeFum-FePo-FA-B Cmp-C-Zn-Mn-Cu (SE-TAN PLUS) 162-115.2-1 MG CAPS Take 1 capsule by mouth every morning.    . ferrous fumarate-iron polysaccharide complex (TANDEM) 162-115.2 MG CAPS capsule Take 1 capsule by mouth daily with breakfast. 30 capsule 2  . Fluticasone-Umeclidin-Vilant (TRELEGY ELLIPTA) 100-62.5-25  MCG/INH AEPB Inhale 1 puff into the lungs daily. 3 each 1  . furosemide (LASIX) 40 MG tablet Take 1 tablet (40 mg total) by mouth daily. 30 tablet 0  . hydrocerin (EUCERIN) CREA Apply 1 application topically daily. Apply Eucerin to bilat legs Q day after washing with soap and water and roughly towel-drying to assist with removal of loose skin. 113 g 0  . losartan (COZAAR) 25 MG tablet Take 1 tablet (25 mg total) by mouth daily. 30 tablet 0  . Multiple Vitamin (MULTIVITAMIN) capsule Take 1 capsule by mouth daily.    . nitroGLYCERIN (NITROSTAT) 0.4 MG SL tablet Place 1 tablet (0.4 mg total) under the tongue every 5 (five) minutes as needed for chest pain. Maximum dose 3 tablets 50 tablet 3  . OXYGEN Inhale 2 L into the lungs 3 (three) times daily as needed (shortness of breath).     . potassium chloride SA (KLOR-CON) 20 MEQ tablet Take 1 tablet (20 mEq total) by mouth daily. 30 tablet 0  . simvastatin (ZOCOR) 20 MG tablet Take 1 tablet (20 mg total) by mouth at bedtime. 90 tablet 3  . tamsulosin (FLOMAX) 0.4 MG CAPS capsule Take 1 capsule (0.4 mg total) by mouth daily. 30 capsule 1  . VENTOLIN HFA 108 (90 Base) MCG/ACT inhaler INHALE 2 PUFFS EVERY 6 HOURS AS NEEDED FOR WHEEZING OR SHORTNESS OF BREATH 18 g 2  . vitamin B-12 (CYANOCOBALAMIN) 1000 MCG tablet Take 1,000 mcg by mouth daily.     No facility-administered medications prior to visit.    Review of Systems;  Patient denies headache, fevers, malaise, unintentional weight  loss, skin rash, eye pain, sinus congestion and sinus pain, sore throat, dysphagia,  hemoptysis , cough, dyspnea, wheezing, chest pain, palpitations, orthopnea, edema, abdominal pain, nausea, melena, diarrhea, constipation, flank pain, dysuria, hematuria, urinary  Frequency, nocturia, numbness, tingling, seizures,  Focal weakness, Loss of consciousness,  Tremor, insomnia, depression, anxiety, and suicidal ideation.      Objective:  BP 130/76 (BP Location: Left Arm, Patient  Position: Sitting, Cuff Size: Large)   Pulse (!) 56   Temp 98 F (36.7 C) (Temporal)   Resp 18   Ht 6' (1.829 m)   Wt 287 lb (130.2 kg)   SpO2 97%   BMI 38.92 kg/m   BP Readings from Last 3 Encounters:  07/01/20 130/76  06/25/20 137/78  06/09/20 (!) 148/74    Wt Readings from Last 3 Encounters:  07/01/20 287 lb (130.2 kg)  06/25/20 289 lb 1.6 oz (131.1 kg)  06/09/20 297 lb (134.7 kg)    General appearance: alert, cooperative and appears stated age Ears: normal TM's and external ear canals both ears Throat: lips, mucosa, and tongue normal; teeth and gums normal Neck: no adenopathy, no carotid bruit, supple, symmetrical, trachea midline and thyroid not enlarged, symmetric, no tenderness/mass/nodules Back: symmetric, no curvature. ROM normal. No CVA tenderness. Lungs: clear to auscultation bilaterally Heart: regular rate and rhythm, S1, S2 normal, no murmur, click, rub or gallop Abdomen: soft, non-tender; bowel sounds normal; no masses,  no organomegaly Pulses: 2+ and symmetric Skin: Skin color, texture, turgor normal. No rashes or lesions Lymph nodes: Cervical, supraclavicular, and axillary nodes normal.  Lab Results  Component Value Date   HGBA1C 6.3 (H) 06/23/2020   HGBA1C 6.6 (H) 03/15/2020   HGBA1C 6.2 07/22/2019    Lab Results  Component Value Date   CREATININE 1.21 07/01/2020   CREATININE 1.23 06/25/2020   CREATININE 1.30 (H) 06/24/2020    Lab Results  Component Value Date   WBC 6.1 07/01/2020   HGB 9.3 (L) 07/01/2020   HCT 29.2 (L) 07/01/2020   PLT 163.0 07/01/2020   GLUCOSE 91 07/01/2020   CHOL 123 03/15/2020   TRIG 104.0 03/15/2020   HDL 38.80 (L) 03/15/2020   LDLDIRECT 72.0 12/01/2016   LDLCALC 63 03/15/2020   ALT 17 06/21/2020   AST 19 06/21/2020   NA 143 07/01/2020   K 4.0 07/01/2020   CL 96 07/01/2020   CREATININE 1.21 07/01/2020   BUN 21 07/01/2020   CO2 42 (H) 07/01/2020   TSH 2.088 06/23/2020   INR 0.9 04/02/2015   HGBA1C 6.3 (H)  06/23/2020   MICROALBUR <0.7 07/22/2019    ECHOCARDIOGRAM COMPLETE  Result Date: 06/22/2020    ECHOCARDIOGRAM REPORT   Patient Name:   Victor Daniels Date of Exam: 06/22/2020 Medical Rec #:  [id number]             Height:       72.0 in Accession #:    [id number]            Weight:       299.0 lb Date of Birth:  07-16-1946            BSA:          2.528 m Patient Age:    73 years              BP:           121/86 mmHg Patient Gender: M  HR:           56 bpm. Exam Location:  [facility] Procedure: 2D Echo, Color Doppler, Cardiac Doppler and Intracardiac            Opacification Agent Indications:     I50.31 CHF-Acute Diastolic  History:         Patient has prior history of Echocardiogram examinations. CHF,                  Previous Myocardial Infarction and CAD, Stroke, PAD, COPD and                  PAD; Risk Factors:Sleep Apnea, Hypertension, Diabetes and                  Dyslipidemia.  Sonographer:     Humphrey Rolls RDCS (AE) Referring Phys:  [id number] Lucile Shutters Diagnosing Phys: Yvonne Kendall MD  Sonographer Comments: Technically challenging study due to limited acoustic windows. Image acquisition challenging due to patient body habitus and Image acquisition challenging due to COPD. IMPRESSIONS  1. Left ventricular ejection fraction, by estimation, is >55%. The left ventricle has normal function. Left ventricular endocardial border not optimally defined to evaluate regional wall motion. Left ventricular diastolic parameters are indeterminate.  2. Right ventricular systolic function was not well visualized. The right ventricular size is not well visualized.  3. The mitral valve was not well visualized. Unable to assess mitral valve regurgitation.  4. Tricuspid valve regurgitation not well visualized.  5. The aortic valve was not well visualized. Aortic valve regurgitation not well assessed. No aortic stenosis is present.  6. Pulmonic valve regurgitation not well visualized.  7. The inferior  vena cava is dilated in size with <50% respiratory variability, suggesting right atrial pressure of 15 mmHg. FINDINGS  Left Ventricle: Left ventricular dimensions are not well characterized. Left ventricular ejection fraction, by estimation, is >55%. The left ventricle has normal function. Left ventricular endocardial border not optimally defined to evaluate regional wall motion. Definity contrast agent was given IV to delineate the left ventricular endocardial borders. Left ventricular diastolic parameters are indeterminate. Right Ventricle: The right ventricular size is not well visualized. Right vetricular wall thickness was not assessed. Right ventricular systolic function was not well visualized. Left Atrium: Left atrial size was not well visualized. Right Atrium: Right atrial size was not well visualized. Pericardium: There is no evidence of pericardial effusion. Mitral Valve: The mitral valve was not well visualized. Unable to assess mitral valve regurgitation. MV peak gradient, 8.2 mmHg. The mean mitral valve gradient is 2.0 mmHg. Tricuspid Valve: The tricuspid valve is not well visualized. Tricuspid valve regurgitation not well visualized. Aortic Valve: The aortic valve was not well visualized. Aortic valve regurgitation not well assessed. No aortic stenosis is present. Aortic valve mean gradient measures 6.0 mmHg. Aortic valve peak gradient measures 11.8 mmHg. Pulmonic Valve: The pulmonic valve was not well visualized. Pulmonic valve regurgitation not well visualized. Aorta: The aortic root was not well visualized. Pulmonary Artery: The pulmonary artery is not well seen. Venous: The inferior vena cava is dilated in size with less than 50% respiratory variability, suggesting right atrial pressure of 15 mmHg. IAS/Shunts: No atrial level shunt detected by color flow Doppler.   Diastology LV e' lateral:   12.10 cm/s LV E/e' lateral: 11.0 LV e' medial:    7.07 cm/s LV E/e' medial:  18.9  AORTIC VALVE AV Vmax:            172.00 cm/s AV  Vmean:          107.000 cm/s AV VTI:            0.337 m AV Peak Grad:      11.8 mmHg AV Mean Grad:      6.0 mmHg LVOT Vmax:         130.00 cm/s LVOT Vmean:        82.300 cm/s LVOT VTI:          0.259 m LVOT/AV VTI ratio: 0.77 MITRAL VALVE MV Area (PHT): 3.05 cm     SHUNTS MV Peak grad:  8.2 mmHg     Systemic VTI: 0.26 m MV Mean grad:  2.0 mmHg MV Vmax:       1.43 m/s MV Vmean:      63.9 cm/s MV Decel Time: 249 msec MV E velocity: 133.67 cm/s Yvonne Kendallhristopher End MD Electronically signed by Yvonne Kendallhristopher End MD Signature Date/Time: 06/22/2020/1:48:00 PM    Final     Assessment & Plan:   Problem List Items Addressed This Visit      Unprioritized   CHF with left ventricular diastolic dysfunction, NYHA class 2 (HCC)    EF >55% by ECHO during recent hospitalization. however his diastolic dysfunction is significant due to new onset atrial fibrillation.Advised patient to continue to monitor wt daily and double the lasix dose for overnight weight gain of 2 lbs or weekly weight gain of 5 lbs.       Venous insufficiency (chronic) (peripheral)    Severe, with chronic non pitting edema .  Continue use of stockings and leg elevation      Anemia - Primary   Relevant Medications   FeFum-FePo-FA-B Cmp-C-Zn-Mn-Cu (SE-TAN PLUS) 162-115.2-1 MG CAPS   Other Relevant Orders   CBC with Differential/Platelet (Completed)   Atrial fibrillation (HCC)    Rate controlled.  Now on Eliquis  Lab Results  Component Value Date   HGBA1C 6.3 (H) 06/23/2020         Hospital discharge follow-up    Patient is stable post discharge and has no new issues or questions about discharge plans at the visit today for hospital follow up. All labs , imaging studies and progress notes from admission were reviewed with patient today        Acquired thrombophilia (HCC)    Patient now taking Eliquis for mitigation of embolic stroke risk        Other Visit Diagnoses    Hypokalemia       Relevant Orders   Basic  metabolic panel (Completed)      I am having Tawni MillersEdward R. Mcneice "Ed" maintain his nitroGLYCERIN, multivitamin, OXYGEN, Trelegy Ellipta, Ventolin HFA, vitamin B-12, simvastatin, Tandem, tamsulosin, carvedilol, furosemide, losartan, apixaban, potassium chloride SA, hydrocerin, and Se-Tan PLUS.  No orders of the defined types were placed in this encounter.   There are no discontinued medications.  Follow-up: Return in about 4 weeks (around 07/29/2020).   Sherlene Shamseresa L Mahkai Fangman, MD

## 2020-07-01 NOTE — Patient Instructions (Addendum)
Use the ipatroprium bromide for the moist cough (inhaler or nebulized)  Continue 40 mg lasix  Daily for now until I see your bloodwork    monitor  Your weight daily at the same time (morning ) and take an extra  dose if only if your overnight weight gain is 2 lbs,  or your weekly  weight gain is 5 lbs.

## 2020-07-01 NOTE — Assessment & Plan Note (Signed)
Rate controlled.  Now on Eliquis  Lab Results  Component Value Date   HGBA1C 6.3 (H) 06/23/2020

## 2020-07-02 ENCOUNTER — Ambulatory Visit: Payer: Medicare HMO | Admitting: Podiatry

## 2020-07-02 ENCOUNTER — Other Ambulatory Visit: Payer: Self-pay

## 2020-07-02 DIAGNOSIS — R600 Localized edema: Secondary | ICD-10-CM

## 2020-07-02 DIAGNOSIS — B351 Tinea unguium: Secondary | ICD-10-CM | POA: Diagnosis not present

## 2020-07-02 DIAGNOSIS — L989 Disorder of the skin and subcutaneous tissue, unspecified: Secondary | ICD-10-CM | POA: Diagnosis not present

## 2020-07-02 DIAGNOSIS — M79676 Pain in unspecified toe(s): Secondary | ICD-10-CM | POA: Diagnosis not present

## 2020-07-02 DIAGNOSIS — I872 Venous insufficiency (chronic) (peripheral): Secondary | ICD-10-CM

## 2020-07-02 NOTE — Progress Notes (Signed)
   SUBJECTIVE Patient presents to office today complaining of elongated, thickened nails that cause pain while ambulating in shoes.  He is unable to trim his own nails.  Patient also has symptomatic preulcerative callus lesions noted to the bilateral feet.  He was recently discharged from the hospital with shortness of breath and was most recently placed on Eliquis anticoagulant medication therapy.  Patient is here for further evaluation and treatment.  Past Medical History:  Diagnosis Date  . Alcoholic gastritis   . Arthritis   . CAD (coronary artery disease)   . CHF (congestive heart failure) (HCC)    ischemic CM.  EF 25%  . COPD (chronic obstructive pulmonary disease) (HCC)   . CVA (cerebral infarction)    residual short term memory loss  . Diabetes mellitus without complication (HCC)    diet controlled  . DVT (deep venous thrombosis) (HCC)   . Dyspnea    easily  . Heart attack (HCC) 11/16/09  . History of alcohol abuse 2005   now abstinent for years  . Hyperlipidemia   . Hypertension   . On home oxygen therapy    2 liters continuously  . OSA (obstructive sleep apnea)    not on CPAP  . PAD (peripheral artery disease) (HCC)   . Pneumonia    frequent in the past  . Pre-diabetes   . Stroke (HCC) 2010  . Venous insufficiency of leg     OBJECTIVE General Patient is awake, alert, and oriented x 3 and in no acute distress. Derm Skin is dry and supple bilateral. Negative open lesions or macerations. Remaining integument unremarkable. Nails are tender, long, thickened and dystrophic with subungual debris, consistent with onychomycosis, 1-5 bilateral. No signs of infection noted.  Hyperkeratotic preulcerative callus lesions noted to the bilateral feet x4 Vasc  DP and PT pedal pulses diminished bilaterally secondary to the lower extremity edema. Temperature gradient within normal limits.  Hemosiderin discoloration noted to the bilateral legs consistent with chronic venous  insufficiency Neuro Epicritic and protective threshold sensation grossly intact bilaterally.  Musculoskeletal Exam No symptomatic pedal deformities noted bilateral. Muscular strength within normal limits.  ASSESSMENT 1. Onychodystrophic nails 1-5 bilateral with hyperkeratosis of nails.  2. Onychomycosis of nail due to dermatophyte bilateral 3.  Preulcerative callus lesions bilateral feet x4  4.  Pain in foot bilateral 5.  Chronic venous insufficiency with bilateral lower extremity edema  PLAN OF CARE 1. Patient evaluated today.  2. Instructed to maintain good pedal hygiene and foot care.  3. Mechanical debridement of nails 1-5 bilaterally performed using a nail nipper. Filed with dremel without incident.  4.  Excisional debridement of the hyperkeratotic callus lesions was performed using a tissue nipper without incident or bleeding  5.  Return to clinic in 3 mos. for routine foot care  Felecia Shelling, DPM [facility]  Dr. Felecia Shelling, DPM    [address]                                        [place], [place] [address]                Office [phone]  Fax [phone]

## 2020-07-04 DIAGNOSIS — Z09 Encounter for follow-up examination after completed treatment for conditions other than malignant neoplasm: Secondary | ICD-10-CM | POA: Insufficient documentation

## 2020-07-04 DIAGNOSIS — D6869 Other thrombophilia: Secondary | ICD-10-CM | POA: Insufficient documentation

## 2020-07-04 NOTE — Assessment & Plan Note (Signed)
Patient is stable post discharge and has no new issues or questions about discharge plans at the visit today for hospital follow up. All labs , imaging studies and progress notes from admission were reviewed with patient today   

## 2020-07-04 NOTE — Assessment & Plan Note (Signed)
Severe, with chronic non pitting edema .  Continue use of stockings and leg elevation

## 2020-07-04 NOTE — Assessment & Plan Note (Addendum)
Patient now taking Eliquis for mitigation of embolic stroke risk

## 2020-07-04 NOTE — Assessment & Plan Note (Signed)
EF >55% by ECHO during recent hospitalization. however his diastolic dysfunction is significant due to new onset atrial fibrillation.Advised patient to continue to monitor wt daily and double the lasix dose for overnight weight gain of 2 lbs or weekly weight gain of 5 lbs.

## 2020-07-06 ENCOUNTER — Other Ambulatory Visit: Payer: Self-pay | Admitting: *Deleted

## 2020-07-06 ENCOUNTER — Encounter: Payer: Self-pay | Admitting: *Deleted

## 2020-07-06 DIAGNOSIS — J449 Chronic obstructive pulmonary disease, unspecified: Secondary | ICD-10-CM | POA: Diagnosis not present

## 2020-07-06 NOTE — Patient Outreach (Signed)
[facility]) Care Management  [facility] Care Manager  07/06/2020   KIMM UNGARO 1946-04-22 [id number]   Telephone assessment  Referral Date ; 06/29/20 Referral source: [employer] discharge notification  Date of Admission : 06/21/20 Diagnosis : Acute on chronic heart failure , atrial fib  Date of Discharge : 06/25/20 Facility : [facility]  Insurance: [facility]  Transition of care by PCP   Subjective:  Successful outreach call to patient, he states doing alright at home.  He states that he declined home health services of therapy and RN states he is doing fine with getting around at home.  He discussed improvement in lower legs swelling. He states that his breathing is about the same no worse, no increase in coughing, states swelling in legs has decreased some since discharge. He reports that his scales have stopped working and he needs to get new ones.   He discussed history of prostate problem and has visit with urologist on tomorrow.     Encounter Medications:  Outpatient Encounter Medications as of 07/06/2020  Medication Sig  . apixaban (ELIQUIS) 5 MG TABS tablet Take 1 tablet (5 mg total) by mouth 2 (two) times daily.  . carvedilol (COREG) 6.25 MG tablet Take 1 tablet (6.25 mg total) by mouth 2 (two) times daily with a meal.  . ferrous fumarate-iron polysaccharide complex (TANDEM) 162-115.2 MG CAPS capsule Take 1 capsule by mouth daily with breakfast.  . Fluticasone-Umeclidin-Vilant (TRELEGY ELLIPTA) 100-62.5-25 MCG/INH AEPB Inhale 1 puff into the lungs daily.  . furosemide (LASIX) 40 MG tablet Take 1 tablet (40 mg total) by mouth daily.  . hydrocerin (EUCERIN) CREA Apply 1 application topically daily. Apply Eucerin to bilat legs Q day after washing with soap and water and roughly towel-drying to assist with removal of loose skin.  Marland Kitchen losartan (COZAAR) 25 MG tablet Take 1 tablet (25 mg total) by mouth daily.  . Multiple Vitamin (MULTIVITAMIN)  capsule Take 1 capsule by mouth daily.  . nitroGLYCERIN (NITROSTAT) 0.4 MG SL tablet Place 1 tablet (0.4 mg total) under the tongue every 5 (five) minutes as needed for chest pain. Maximum dose 3 tablets  . OXYGEN Inhale 2 L into the lungs 3 (three) times daily as needed (shortness of breath).   . potassium chloride SA (KLOR-CON) 20 MEQ tablet Take 1 tablet (20 mEq total) by mouth daily.  . simvastatin (ZOCOR) 20 MG tablet Take 1 tablet (20 mg total) by mouth at bedtime.  . tamsulosin (FLOMAX) 0.4 MG CAPS capsule Take 1 capsule (0.4 mg total) by mouth daily.  . VENTOLIN HFA 108 (90 Base) MCG/ACT inhaler INHALE 2 PUFFS EVERY 6 HOURS AS NEEDED FOR WHEEZING OR SHORTNESS OF BREATH  . vitamin B-12 (CYANOCOBALAMIN) 1000 MCG tablet Take 1,000 mcg by mouth daily.  Marland Kitchen FeFum-FePo-FA-B Cmp-C-Zn-Mn-Cu (SE-TAN PLUS) 162-115.2-1 MG CAPS Take 1 capsule by mouth every morning.   No facility-administered encounter medications on file as of 07/06/2020.    Functional Status:  In your present state of health, do you have any difficulty performing the following activities: 07/06/2020 06/21/2020  Hearing? N N  Vision? N N  Difficulty concentrating or making decisions? N N  Walking or climbing stairs? Y Y  Comment uses walker -  Dressing or bathing? Y N  Comment wife helps -  Doing errands, shopping? Y N  Comment wife assist -  Quarry manager and eating ? N -  Using the Toilet? N -  In the past six months, have you  accidently leaked urine? Y -  Comment wears depends -  Do you have problems with loss of bowel control? N -  Managing your Medications? Y -  Comment wife helps -  Managing your Finances? N -  Housekeeping or managing your Housekeeping? Y -  Comment wife does housekeeping -  Some recent data might be hidden    Fall/Depression Screening: Fall Risk  07/06/2020 07/01/2020 05/21/2020  Falls in the past year? 1 - 1  Number falls in past yr: 1 - 0  Injury with Fall? 0 - 0  Risk for fall due to :  History of fall(s);Impaired balance/gait - -  Follow up Falls prevention discussed Falls evaluation completed Falls evaluation completed   PHQ 2/9 Scores 06/29/2020 03/08/2020 05/01/2019 03/06/2019 07/23/2018 05/17/2018 02/26/2018  PHQ - 2 Score 1 0 0 0 0 0 0  PHQ- 9 Score - - 0 - - - -   SDOH Screenings   Alcohol Screen:   . Last Alcohol Screening Score (AUDIT):   Depression (PHQ2-9): Low Risk   . PHQ-2 Score: 1  Financial Resource Strain: Low Risk   . Difficulty of Paying Living Expenses: Not hard at all  Food Insecurity: No Food Insecurity  . Worried About Programme researcher, broadcasting/film/video in the Last Year: Never true  . Ran Out of Food in the Last Year: Never true  Housing: Low Risk   . Last Housing Risk Score: 0  Physical Activity:   . Days of Exercise per Week:   . Minutes of Exercise per Session:   Social Connections:   . Frequency of Communication with Friends and Family:   . Frequency of Social Gatherings with Friends and Family:   . Attends Religious Services:   . Active Member of Clubs or Organizations:   . Attends Banker Meetings:   Marland Kitchen Marital Status:   Stress:   . Feeling of Stress :   Tobacco Use: Medium Risk  . Smoking Tobacco Use: Former Smoker  . Smokeless Tobacco Use: Never Used  Transportation Needs: No Transportation Needs  . Lack of Transportation (Medical): No  . Lack of Transportation (Non-Medical): No    Assessment:   Heart Failure  No worsening symptoms, improvement in lower legs swelling, continues to wear compression hose. Need new scales for monitoring daily weights, able to purchase.  Will benefit from continued education and support on Heart failure daily care management .   Atrial fib  Denies palpitations, increased weakness , chest pain  tolerating usual limited activity in home   COPD He continues daily medication treatment, wearing oxygen,denies worsening symptoms and voices understanding of action plan.    High Fall Risk Declined home  health physical therapy, reinforced falls safety preventions  precautions, use of walker.   Plan: Will plan follow up call in the next week for follow up assessment, education and support chronic medical condition heart failure self care management . Review of fall prevention measures, use of walker, keeping frequently used item within easy reach.  Will send PCP this initial visit note.   [facility] CM Care Plan Problem One     Most Recent Value  Care Plan Problem One At risk for readmission related to recent disccharge due to diagnosis of heart faiilure and Atriial fib   Role Documenting the Problem One Care Management Telephonic Coordinator  Care Plan for Problem One Active  [facility] Long Term Goal  Patient will not report hospital readmission over the next 60 days   [facility]  Long Term Goal Start Date 06/29/20  Interventions for Problem One Long Term Goal Discussed current clinical state, reviewed importance of notifying MD sooner of new or worsening symptom of heart failure, discussed lving with heart daily managment , taking medications as prescribed, knowing how you feel ( zone) , limiting salt in diet, increase activity walking in home as tolerated .   [facility] CM Short Term Goal #1  Over the next 30 days patient will be able to report monitoring and keeping a log on daily weights   [facility] CM Short Term Goal #1 Start Date 06/29/20  Interventions for Short Term Goal #1 Reinforced importance of monitoring weights daily , to identify worsening symptoms sooner, of sudden weight gain , increase in swelling or shortness of breath,. Evaluated patient ability to purchase new scales,   [facility] CM Short Term Goal #2  Over the next 30 days patient will attend all medical appointments   [facility] CM Short Term Goal #2 Start Date 06/29/20  Interventions for Short Term Goal #2 Reinforced with patient attending all medical appointments, stressed scheduling followup with Dr. Mariah Milling cardiiologist, declined need for assistance in scheduling.    [facility] CM Short Term Goal #3 Patient will be able to verbalize 3 signs & symptoms in yellow zone Heart failure to report to MD over the next 30 days   [facility] CM Short Term Goal #3 Start Date 07/06/20  Interventions for Short Tern Goal #3 Discussed with patient zone of heart failure  and action plans, encouraged to review in [facility] calendar when recieved and will follow up.       [name], RN, BSN  [facility] Care Management,Care Management Coordinator  [phone]- Mobile [phone]- Toll Free Main Office

## 2020-07-07 ENCOUNTER — Other Ambulatory Visit: Payer: Self-pay

## 2020-07-07 ENCOUNTER — Encounter: Payer: Medicare HMO | Attending: Internal Medicine | Admitting: Internal Medicine

## 2020-07-07 DIAGNOSIS — Z87891 Personal history of nicotine dependence: Secondary | ICD-10-CM | POA: Diagnosis not present

## 2020-07-07 DIAGNOSIS — Z9981 Dependence on supplemental oxygen: Secondary | ICD-10-CM | POA: Diagnosis not present

## 2020-07-07 DIAGNOSIS — I872 Venous insufficiency (chronic) (peripheral): Secondary | ICD-10-CM | POA: Insufficient documentation

## 2020-07-07 DIAGNOSIS — M1990 Unspecified osteoarthritis, unspecified site: Secondary | ICD-10-CM | POA: Insufficient documentation

## 2020-07-07 DIAGNOSIS — G4733 Obstructive sleep apnea (adult) (pediatric): Secondary | ICD-10-CM | POA: Diagnosis not present

## 2020-07-07 DIAGNOSIS — I509 Heart failure, unspecified: Secondary | ICD-10-CM | POA: Diagnosis not present

## 2020-07-07 DIAGNOSIS — L97829 Non-pressure chronic ulcer of other part of left lower leg with unspecified severity: Secondary | ICD-10-CM | POA: Insufficient documentation

## 2020-07-07 DIAGNOSIS — I890 Lymphedema, not elsewhere classified: Secondary | ICD-10-CM | POA: Diagnosis not present

## 2020-07-07 DIAGNOSIS — J449 Chronic obstructive pulmonary disease, unspecified: Secondary | ICD-10-CM | POA: Insufficient documentation

## 2020-07-07 DIAGNOSIS — E11628 Type 2 diabetes mellitus with other skin complications: Secondary | ICD-10-CM | POA: Diagnosis not present

## 2020-07-07 DIAGNOSIS — I2510 Atherosclerotic heart disease of native coronary artery without angina pectoris: Secondary | ICD-10-CM | POA: Diagnosis not present

## 2020-07-07 DIAGNOSIS — I110 Hypertensive heart disease with heart failure: Secondary | ICD-10-CM | POA: Diagnosis not present

## 2020-07-07 DIAGNOSIS — E11622 Type 2 diabetes mellitus with other skin ulcer: Secondary | ICD-10-CM | POA: Diagnosis not present

## 2020-07-08 NOTE — Progress Notes (Signed)
   07/09/2020  CC:  Chief Complaint  Patient presents with  . Cysto    URK:YHCWCB R Lehner is a 74 y.o. male with severe lower urinary tract symptoms who returns for a cystoscopy.   - 6-8 month history of bothersome urinary symptoms -See my office note 06/09/2020 -No improvement on tamsulosin -Complains of spraying of his urinary stream  Weight 300 lb (136.1 kg). NED. A&Ox3.   No respiratory distress   Abd soft, NT, ND Normal phallus with bilateral descended testicles  Cystoscopy Procedure Note  Patient identification was confirmed, informed consent was obtained, and patient was prepped using Betadine solution.  Lidocaine jelly was administered per urethral meatus.     Pre-Procedure: - Inspection reveals a normal caliber urethral meatus.  Procedure: The flexible cystoscope would not advance just inside the urethral meatus -It was subsequently advanced under direct vision with gentle pressure - Moderate lateral lobe enlargement.  - Normal bladder neck - Bilateral ureteral orifices identified - Bladder mucosa  reveals no ulcers, tumors, or lesions - No bladder stones - No trabeculation  Retroflexion shows no intravesical median lobe   Post-Procedure: - Patient tolerated the procedure well  Assessment/ Plan:  1. Severe LUTS Symptoms may be secondary to distal urethral stricture which was dilated with the cystoscope  Hopefully voiding symptoms will improve post cystoscopy.  He was instructed to call back in 2 weeks if no symptom improvement  I, Theador Hawthorne, am acting as a scribe for Dr. Lorin Picket C. Safari Cinque,  I have reviewed the above documentation for accuracy and completeness, and I agree with the above.   Riki Altes, MD

## 2020-07-08 NOTE — Progress Notes (Addendum)
Victor Daniels, Victor Daniels ([id number]) Visit Report for 07/07/2020 Arrival Information Details Patient Name: Victor Daniels, Victor Daniels. Date of Service: 07/07/2020 10:15 AM Medical Record Number: [id number] Patient Account Number: [id number] Date of Birth/Sex: 1946/01/06 (74 y.o. M) Treating RN: Huel Coventry Primary Care Fleur Audino: Duncan Dull Other Clinician: Referring Breezy Hertenstein: Duncan Dull Treating Ahsan Esterline/Extender: Altamese [place] in Treatment: 2 Visit Information History Since Last Visit Added or deleted any medications: Yes Patient Arrived: Dan Humphreys Had a fall or experienced change in No Arrival Time: 10:22 activities of daily living that may affect Accompanied By: spouse risk of falls: Transfer Assistance: None Hospitalized since last visit: Yes Patient Has Alerts: Yes Pain Present Now: No Patient Alerts: Type II Diabetic Electronic Signature(s) Signed: 07/07/2020 4:08:07 PM By: Rodell Perna Entered By: Rodell Perna on 07/07/2020 10:28:13 Victor Daniels ([id number]) -------------------------------------------------------------------------------- Clinic Level of Care Assessment Details Patient Name: Victor Daniels. Date of Service: 07/07/2020 10:15 AM Medical Record Number: [id number] Patient Account Number: [id number] Date of Birth/Sex: January 18, 1946 (74 y.o. M) Treating RN: Huel Coventry Primary Care Sidney Kann: Duncan Dull Other Clinician: Referring Mirielle Byrum: Duncan Dull Treating Laquitta Dominski/Extender: Altamese [place] in Treatment: 2 Clinic Level of Care Assessment Items TOOL 4 Quantity Score []  - Use when only an EandM is performed on FOLLOW-UP visit 0 ASSESSMENTS - Nursing Assessment / Reassessment X - Reassessment of Co-morbidities (includes updates in patient status) 1 10 X- 1 5 Reassessment of Adherence to Treatment Plan ASSESSMENTS - Wound and Skin Assessment / Reassessment []  - Simple Wound Assessment / Reassessment - one wound 0 []  -  0 Complex Wound Assessment / Reassessment - multiple wounds []  - 0 Dermatologic / Skin Assessment (not related to wound area) ASSESSMENTS - Focused Assessment X - Circumferential Edema Measurements - multi extremities 1 5 []  - 0 Nutritional Assessment / Counseling / Intervention []  - 0 Lower Extremity Assessment (monofilament, tuning fork, pulses) []  - 0 Peripheral Arterial Disease Assessment (using hand held doppler) ASSESSMENTS - Ostomy and/or Continence Assessment and Care []  - Incontinence Assessment and Management 0 []  - 0 Ostomy Care Assessment and Management (repouching, etc.) PROCESS - Coordination of Care X - Simple Patient / Family Education for ongoing care 1 15 []  - 0 Complex (extensive) Patient / Family Education for ongoing care X- 1 10 Staff obtains Consents, Records, Test Results / Process Orders []  - 0 Staff telephones HHA, Nursing Homes / Clarify orders / etc []  - 0 Routine Transfer to another Facility (non-emergent condition) []  - 0 Routine Hospital Admission (non-emergent condition) []  - 0 New Admissions / / Ordering NPWT, Apligraf, etc. []  - 0 Emergency Hospital Admission (emergent condition) X- 1 10 Simple Discharge Coordination []  - 0 Complex (extensive) Discharge Coordination PROCESS - Special Needs []  - Pediatric / Minor Patient Management 0 []  - 0 Isolation Patient Management []  - 0 Hearing / Language / Visual special needs []  - 0 Assessment of Community assistance (transportation, D/C planning, etc.) []  - 0 Additional assistance / Altered mentation []  - 0 Support Surface(s) Assessment (bed, cushion, seat, etc.) INTERVENTIONS - Wound Cleansing / Measurement HIEP, OLLIS R. ( ) []  - 0 Simple Wound Cleansing - one wound []  - 0 Complex Wound Cleansing - multiple wounds []  - 0 Wound Imaging (photographs - any number of wounds) []  - 0 Wound Tracing (instead of photographs) []  - 0 Simple Wound  Measurement - one wound []  - 0 Complex Wound Measurement - multiple wounds INTERVENTIONS - Wound Dressings []  - Small Wound Dressing one or  multiple wounds 0 []  - 0 Medium Wound Dressing one or multiple wounds []  - 0 Large Wound Dressing one or multiple wounds []  - 0 Application of Medications - topical []  - 0 Application of Medications - injection INTERVENTIONS - Miscellaneous []  - External ear exam 0 []  - 0 Specimen Collection (cultures, biopsies, blood, body fluids, etc.) []  - 0 Specimen(s) / Culture(s) sent or taken to Lab for analysis []  - 0 Patient Transfer (multiple staff / / Similar devices) []  - 0 Simple Staple / Suture removal (25 or less) []  - 0 Complex Staple / Suture removal (26 or more) []  - 0 Hypo / Hyperglycemic Management (close monitor of Blood Glucose) []  - 0 Ankle / Brachial Index (ABI) - do not check if billed separately X- 1 5 Vital Signs Has the patient been seen at the hospital within the last three years: Yes Total Score: 60 Level Of Care: New/Established - Level 2 Electronic Signature(s) Signed: 07/07/2020 6:25:04 PM By: , BSN, RN, CWS, Kim RN, BSN Entered By: , BSN, RN, CWS, Kim on 07/07/2020 11:06:04 ( ) -------------------------------------------------------------------------------- Encounter Discharge Information Details Patient Name: RYOTT, Victor Daniels. Date of Service: 07/07/2020 10:15 AM Medical Record Number: Patient Account Number: Date of Birth/Sex: 12-14-1946 (74 y.o. M) Treating RN: 07/09/2020 Primary Care Walaa Carel: Elliot Gurney Other Clinician: Referring Terrill Alperin: Elliot Gurney Treating Sister Carbone/Extender: 07/09/2020 in Treatment: 2 Encounter Discharge Information Items Discharge Condition: Stable Ambulatory Status: Wheelchair Discharge Destination: Home Transportation: Private Auto Accompanied By: wife Schedule Follow-up Appointment:  Yes Clinical Summary of Care: Electronic Signature(s) Signed: 07/07/2020 12:48:45 PM By: [id number], BSN, RN, CWS, Kim RN, BSN Entered By: Victor Daniels, BSN, RN, CWS, Kim on 07/07/2020 12:48:44 [id number] ([id number]) -------------------------------------------------------------------------------- Lower Extremity Assessment Details Patient Name: Victor Daniels, Victor Daniels. Date of Service: 07/07/2020 10:15 AM Medical Record Number: Huel Coventry Patient Account Number: Duncan Dull Date of Birth/Sex: 1946/05/10 (74 y.o. M) Treating RN: 07/09/2020 Primary Care Henryk Ursin: Elliot Gurney Other Clinician: Referring Cordelle Dahmen: Elliot Gurney Treating Kera Deacon/Extender: 07/09/2020 in Treatment: 2 Edema Assessment Assessed: [Left: No] [Right: No] Edema: [Left: Yes] [Right: Yes] Calf Left: Right: Point of Measurement: 35 cm From Medial Instep 49 cm 49 cm Ankle Left: Right: Point of Measurement: 13 cm From Medial Instep 30 cm 30 cm Vascular Assessment Pulses: Dorsalis Pedis Palpable: [Left:Yes] [Right:Yes] Electronic Signature(s) Signed: 07/07/2020 4:08:07 PM By: [id number] Entered By: Victor Daniels on 07/07/2020 10:34:35 [id number] ([id number]) -------------------------------------------------------------------------------- Multi Wound Chart Details Patient Name: Victor Daniels. Date of Service: 07/07/2020 10:15 AM Medical Record Number: Rodell Perna Patient Account Number: Duncan Dull Date of Birth/Sex: 03-03-1946 (74 y.o. M) Treating RN: 07/09/2020 Primary Care Duglas Heier: Rodell Perna Other Clinician: Referring Santana Edell: Rodell Perna Treating Deone Leifheit/Extender: 07/09/2020 in Treatment: 2 Vital Signs Height(in): 72 Pulse(bpm): 59 Weight(lbs): 297 Blood Pressure(mmHg): 134/73 Body Mass Index(BMI): 40 Temperature(F): 98.3 Respiratory Rate(breaths/min): 16 Wound Assessments Treatment Notes Electronic Signature(s) Signed: 07/07/2020 4:21:24 PM  By: [id number] MD Entered By: Victor Daniels on 07/07/2020 12:06:37 [id number] ([id number]) -------------------------------------------------------------------------------- Multi-Disciplinary Care Plan Details Patient Name: Victor Daniels, Victor Daniels. Date of Service: 07/07/2020 10:15 AM Medical Record Number: Huel Coventry Patient Account Number: Duncan Dull Date of Birth/Sex: 02-Jan-1946 (74 y.o. M) Treating RN: 07/09/2020 Primary Care Jaydan Chretien: Baltazar Najjar Other Clinician: Referring Kevontae Burgoon: Baltazar Najjar Treating Avery Klingbeil/Extender: 07/09/2020 in Treatment: 2 Active Inactive Electronic Signature(s) Signed: 08/16/2020 2:45:13 PM By: [id number], BSN, RN, CWS, Kim RN, BSN Previous  Signature: 07/07/2020 6:25:04 PM Version By: Elliot Gurney, BSN, RN, CWS, Kim RN, BSN Entered By: Elliot Gurney, BSN, RN, CWS, Kim on 08/16/2020 14:45:12 Victor Daniels ([id number]) -------------------------------------------------------------------------------- Non-Wound Condition Assessment Details Patient Name: Victor Daniels, Victor Daniels. Date of Service: 07/07/2020 10:15 AM Medical Record Number: [id number] Patient Account Number: [id number] Date of Birth/Sex: 1946-06-12 (74 y.o. M) Treating RN: Rodell Perna Primary Care Eesha Schmaltz: Duncan Dull Other Clinician: Referring Miaa Latterell: Duncan Dull Treating Alfonzo Arca/Extender: Maxwell Caul Weeks in Treatment: 2 Non-Wound Condition: Condition: Lymphedema Location: Leg Side: Bilateral Electronic Signature(s) Signed: 07/07/2020 4:08:07 PM By: Rodell Perna Entered By: Rodell Perna on 07/07/2020 10:32:25 Victor Daniels ([id number]) -------------------------------------------------------------------------------- Pain Assessment Details Patient Name: Victor Daniels, Victor Daniels. Date of Service: 07/07/2020 10:15 AM Medical Record Number: [id number] Patient Account Number: [id number] Date of Birth/Sex: 01/06/1946 (74 y.o. M) Treating RN: Rodell Perna Primary Care Jahseh Lucchese: Duncan Dull Other Clinician: Referring Fadil Macmaster: Duncan Dull Treating Jequan Shahin/Extender: Altamese [place] in Treatment: 2 Active Problems Location of Pain Severity and Description of Pain Patient Has Paino No Site Locations Pain Management and Medication Current Pain Management: Electronic Signature(s) Signed: 07/07/2020 4:08:07 PM By: Rodell Perna Entered By: Rodell Perna on 07/07/2020 10:30:21 Victor Daniels ([id number]) -------------------------------------------------------------------------------- Patient/Caregiver Education Details Patient Name: Victor Daniels, Victor Daniels. Date of Service: 07/07/2020 10:15 AM Medical Record Number: [id number] Patient Account Number: [id number] Date of Birth/Gender: 10-Dec-1946 (74 y.o. M) Treating RN: Huel Coventry Primary Care Physician: Duncan Dull Other Clinician: Referring Physician: Duncan Dull Treating Physician/Extender: Altamese [place] in Treatment: 2 Education Assessment Education Provided To: Patient Education Topics Provided Wound/Skin Impairment: Handouts: Caring for Your Ulcer Methods: Demonstration, Explain/Verbal Responses: State content correctly Electronic Signature(s) Signed: 07/07/2020 6:25:04 PM By: Elliot Gurney, BSN, RN, CWS, Kim RN, BSN Entered By: Elliot Gurney, BSN, RN, CWS, Kim on 07/07/2020 11:06:43 Victor Daniels ([id number]) -------------------------------------------------------------------------------- Vitals Details Patient Name: Victor Daniels. Date of Service: 07/07/2020 10:15 AM Medical Record Number: [id number] Patient Account Number: [id number] Date of Birth/Sex: Aug 20, 1946 (74 y.o. M) Treating RN: Rodell Perna Primary Care Izic Stfort: Duncan Dull Other Clinician: Referring Arlyce Circle: Duncan Dull Treating Letrice Pollok/Extender: Altamese [place] in Treatment: 2 Vital Signs Time Taken: 10:29 Temperature (F): 98.3 Height (in): 72 Pulse  (bpm): 59 Weight (lbs): 297 Respiratory Rate (breaths/min): 16 Body Mass Index (BMI): 40.3 Blood Pressure (mmHg): 134/73 Reference Range: 80 - 120 mg / dl Electronic Signature(s) Signed: 07/07/2020 4:08:07 PM By: Rodell Perna Entered By: Rodell Perna on 07/07/2020 10:30:00

## 2020-07-08 NOTE — Progress Notes (Signed)
LENARD, KAMPF ([id number]) Visit Report for 07/07/2020 HPI Details Patient Name: Victor Daniels, Victor Daniels. Date of Service: 07/07/2020 10:15 AM Medical Record Number: [id number] Patient Account Number: [id number] Date of Birth/Sex: [birth year] (74 y.o. M) Treating RN: Primary Care Provider: Duncan Dull Other Clinician: Referring Provider: Duncan Dull Treating Provider/Extender: Altamese [place] in Treatment: 2 History of Present Illness Location: Patient has bilateral venous stasis with ulcerations with inflammation of the left leg Severity: The wounds are very clean and had been improving and measured smaller with silver collagen Duration: The patient has had problems with venous ulcers bilaterally from time to time for the last couple of years HPI Description: 07/24/18 This is a now 74 year old man who we have had in this clinic 3 times in the past. In 2015 he was here for left leg ulcer felt to be secondary to chronic venous insufficiency. In September 2017 here for bilateral lower extremity wounds again felt to be secondary to chronic venous insufficiency. He was discharged on this Occasion with 2030 mm below-knee stockings although there was a comment that he would probably need more than that perhaps 30-40 below-knee stockings. The last time he was here was from 10/17/17 through 10/31/17. He had a traumatic wound on his left great toe that closed. I had ordered arterial studies on him at that point although it doesn't look like these were ever done. Currently the patient says about 6 weeks ago he started developing more swelling in the left leg. The left leg is chronically more swollen than the right but he said the swelling got to the point where his foot and ankle were tight. He then developed a large blistero Hematoma on the left anterior tibial area with 2 other small wounds anteriorly and one posteriorly. He is only been putting gauze on these. He has very old 20-30  mm below-knee stockings but I doubt he is getting enough compression from these. Past medical history is remarkable for COPD on what appears to be when necessary oxygen, obstructive sleep apnea [has not tolerated CPAP], type 2 diabetes on diet control with a recent hemoglobin A1c of 6.4. also hypertension, coronary artery disease and congestive heart failure. The patient tells me he had a CT scan of his chest 6-8 weeks ago to follow-up for a left upper lobe pulmonary nodule. ABIs were noncompressible in this clinic bilaterally 07/31/18-He is seen in follow-up evaluation for left lateral lower leg ulceration. He is tolerating compression therapy. He has been submitted for new compression stockings. His current compression stockings appear to be low-grade (8-12, 15-20 mmHg), he also admits using these improperly applying at bedtime and removing in the morning. He has been reeducated on appropriate compression stocking where. We will continue with same treatment plan he will follow-up next week. He has also been encouraged to maintain compression, not to remove prior to follow up appointment 08/07/18-He is seen in follow-up evaluation for left lower extremity ulcers. There is significant improvement and we will continue with same treatment plan. He did obtain to repair of 20-30 mmHg compression stockings from elastic therapy but states that he cannot put these on and as been wearing his old stockings to the right leg. He will be ordering stockings through [facility]. 08/14/18; he is here for follow-up evaluation of his left lower extremity ulcer. This is anteriorly and really is a lot smaller with only a small healthy- looking open area remaining. He did obtain appear of 20/30 mm compression [place] [facility] but  states these were too tight he couldn't get him on. He brings in some 15 mm support hose, I told him that I don't think this will be sufficient. He does not  have sufficient funds to pay for wraparound stockings such as juxtalite stockings 08/21/18; his left lateral lower extremity ulcer is healed. He has a mixture of different stockings. The 20/30 stockings from [place] did not fit him. We did manage to get one of the 20-30 mm stockings on him today. He is going to order a 2x stocking from [employer].in any case his wounds are closed Readmission: 06/17/2020 upon evaluation today patient appears to be doing excellent at this time in regard to the fact that he does not have any actual open wounds currently. Unfortunately he does have severe stage III lymphedema and that he does have weeping and blistering that occurs off and on frequently. I do believe that he is going to be requiring intervention with compression therapy. With that being said for now I think Tubigrip is probably can be what we can have to hold onto until we get the results of a arterial study with ABI and TBI. He was previously noncompressible and again he has not seen vascular for some time. Fortunately there is no evidence of active infection at this time which is great news. With that being said he does have COPD and he does have difficulty breathing as well. I am concerned about compression in general and whether or not working to cause issues with significant complications with his breathing if we indeed do compression wrap him significantly. He may also be a candidate for visit to lymphedema clinic if we get things under control. Nonetheless I do think we can need to coordinate to some degree with Dr. [name] his primary care provider. 7/14; since the patient was last here he was apparently hospitalized for 5 days with respiratory issues. He comes back to clinic with no open wounds on his bilateral lower legs. During the hospitalization he did have venous studies. There was no DVT in the right leg and no suggestion of venous reflux. No DVT in the left leg and no suggestion of venous  reflux either. Electronic Signature(s) Signed: 07/07/2020 4:21:24 PM By: Baltazar Najjarobson, Jakira Mcfadden MD Entered By: Baltazar Najjarobson, Jalee Saine on 07/07/2020 12:08:14 Victor KempKIRCHGESSNER, Iliya R. ([id number]) -------------------------------------------------------------------------------- Physical Exam Details Patient Name: Victor KempKIRCHGESSNER, Victor R. Date of Service: 07/07/2020 10:15 AM Medical Record Number: [id number] Patient Account Number: [id number] Date of Birth/Sex: Jun 30, 1946 20(73 y.o. M) Treating RN: Primary Care Provider: Duncan Dullullo, Teresa Other Clinician: Referring Provider: Duncan Dullullo, Teresa Treating Provider/Extender: Altamese CarolinaOBSON, Tamitha Norell G Weeks in Treatment: 2 Constitutional Sitting or standing Blood Pressure is within target range for patient.. Pulse regular and within target range for patient.Marland Kitchen. Respirations regular, non- labored and within target range.. Temperature is normal and within the target range for the patient.Marland Kitchen. appears in no distress. Respiratory Patient has O2 on but no clear respiratory distress. Notes Wound exam; there is no open wound. The patient has severe bilateral lymphedema with skin changes secondary to this. His edema is not really too bad. No pitting edema Electronic Signature(s) Signed: 07/07/2020 4:21:24 PM By: Baltazar Najjarobson, Tanna Loeffler MD Entered By: Baltazar Najjarobson, Aydrian Halpin on 07/07/2020 12:09:56 Victor KempKIRCHGESSNER, Trajan R. ([id number]) -------------------------------------------------------------------------------- Physician Orders Details Patient Name: Victor KempKIRCHGESSNER, Marvie R. Date of Service: 07/07/2020 10:15 AM Medical Record Number: [id number] Patient Account Number: [id number] Date of Birth/Sex: Jun 30, 1946 [age] y.o. M) Treating RN: Huel CoventryWoody, Kim Primary Care Provider: Duncan Dullullo, Teresa Other Clinician: Referring Provider: Duncan Dullullo, Teresa Treating Provider/Extender: Leanord HawkingOBSON,  Salli Bodin G Weeks in Treatment: 2 Verbal / Phone Orders: No Diagnosis Coding Follow-up Appointments o Return Appointment in 1 week. Edema  Control o Patient to wear own Velcro compression garment. - Order Fabian November 4000-Biateral o Other: - Tubi-grip G until wraps come in. Electronic Signature(s) Signed: 07/07/2020 4:21:24 PM By: Baltazar Najjar MD Signed: 07/07/2020 6:25:04 PM By: Elliot Gurney, BSN, RN, CWS, Kim RN, BSN Entered By: Elliot Gurney, BSN, RN, CWS, Kim on 07/07/2020 11:04:58 SION, REINDERS ([id number]) -------------------------------------------------------------------------------- Problem List Details Patient Name: JUVENAL, Victor Daniels. Date of Service: 07/07/2020 10:15 AM Medical Record Number: [id number] Patient Account Number: [id number] Date of Birth/Sex: December 28, 1945 (74 y.o. M) Treating RN: Primary Care Provider: Duncan Dull Other Clinician: Referring Provider: Duncan Dull Treating Provider/Extender: Altamese [place] in Treatment: 2 Active Problems ICD-10 Encounter Code Description Active Date MDM Diagnosis I89.0 Lymphedema, not elsewhere classified 06/17/2020 No Yes E11.622 Type 2 diabetes mellitus with other skin ulcer 06/17/2020 No Yes I87.2 Venous insufficiency (chronic) (peripheral) 06/17/2020 No Yes Inactive Problems Resolved Problems Electronic Signature(s) Signed: 07/07/2020 4:21:24 PM By: Baltazar Najjar MD Entered By: Baltazar Najjar on 07/07/2020 12:06:24 Victor Daniels ([id number]) -------------------------------------------------------------------------------- Progress Note Details Patient Name: Victor Daniels. Date of Service: 07/07/2020 10:15 AM Medical Record Number: [id number] Patient Account Number: [id number] Date of Birth/Sex: [birth year] (74 y.o. M) Treating RN: Primary Care Provider: Duncan Dull Other Clinician: Referring Provider: Duncan Dull Treating Provider/Extender: Altamese [place] in Treatment: 2 Subjective History of Present Illness (HPI) The following HPI elements were documented for the patient's wound: Location: Patient has bilateral  venous stasis with ulcerations with inflammation of the left leg Severity: The wounds are very clean and had been improving and measured smaller with silver collagen Duration: The patient has had problems with venous ulcers bilaterally from time to time for the last couple of years 07/24/18 This is a now 74 year old man who we have had in this clinic 3 times in the past. In 2015 he was here for left leg ulcer felt to be secondary to chronic venous insufficiency. In September 2017 here for bilateral lower extremity wounds again felt to be secondary to chronic venous insufficiency. He was discharged on this Occasion with 2030 mm below-knee stockings although there was a comment that he would probably need more than that perhaps 30-40 below-knee stockings. The last time he was here was from 10/17/17 through 10/31/17. He had a traumatic wound on his left great toe that closed. I had ordered arterial studies on him at that point although it doesn't look like these were ever done. Currently the patient says about 6 weeks ago he started developing more swelling in the left leg. The left leg is chronically more swollen than the right but he said the swelling got to the point where his foot and ankle were tight. He then developed a large blistero Hematoma on the left anterior tibial area with 2 other small wounds anteriorly and one posteriorly. He is only been putting gauze on these. He has very old 20-30 mm below-knee stockings but I doubt he is getting enough compression from these. Past medical history is remarkable for COPD on what appears to be when necessary oxygen, obstructive sleep apnea [has not tolerated CPAP], type 2 diabetes on diet control with a recent hemoglobin A1c of 6.4. also hypertension, coronary artery disease and congestive heart failure. The patient tells me he had a CT scan of his chest 6-8 weeks ago to follow-up for a left upper lobe pulmonary nodule. ABIs were  noncompressible in this  clinic bilaterally 07/31/18-He is seen in follow-up evaluation for left lateral lower leg ulceration. He is tolerating compression therapy. He has been submitted for new compression stockings. His current compression stockings appear to be low-grade (8-12, 15-20 mmHg), he also admits using these improperly applying at bedtime and removing in the morning. He has been reeducated on appropriate compression stocking where. We will continue with same treatment plan he will follow-up next week. He has also been encouraged to maintain compression, not to remove prior to follow up appointment 08/07/18-He is seen in follow-up evaluation for left lower extremity ulcers. There is significant improvement and we will continue with same treatment plan. He did obtain to repair of 20-30 mmHg compression stockings from elastic therapy but states that he cannot put these on and as been wearing his old stockings to the right leg. He will be ordering stockings through [facility]. 08/14/18; he is here for follow-up evaluation of his left lower extremity ulcer. This is anteriorly and really is a lot smaller with only a small healthy- looking open area remaining. He did obtain appear of 20/30 mm compression stockings from elastic therapy and [place] but states these were too tight he couldn't get him on. He brings in some 15 mm support hose, I told him that I don't think this will be sufficient. He does not have sufficient funds to pay for wraparound stockings such as juxtalite stockings 08/21/18; his left lateral lower extremity ulcer is healed. He has a mixture of different stockings. The 20/30 stockings from [place] did not fit him. We did manage to get one of the 20-30 mm stockings on him today. He is going to order a 2x stocking from [employer].in any case his wounds are closed Readmission: 06/17/2020 upon evaluation today patient appears to be doing excellent at this time in regard to the fact that he does not have  any actual open wounds currently. Unfortunately he does have severe stage III lymphedema and that he does have weeping and blistering that occurs off and on frequently. I do believe that he is going to be requiring intervention with compression therapy. With that being said for now I think Tubigrip is probably can be what we can have to hold onto until we get the results of a arterial study with ABI and TBI. He was previously noncompressible and again he has not seen vascular for some time. Fortunately there is no evidence of active infection at this time which is great news. With that being said he does have COPD and he does have difficulty breathing as well. I am concerned about compression in general and whether or not working to cause issues with significant complications with his breathing if we indeed do compression wrap him significantly. He may also be a candidate for visit to lymphedema clinic if we get things under control. Nonetheless I do think we can need to coordinate to some degree with Dr. Darrick Huntsman his primary care provider. 7/14; since the patient was last here he was apparently hospitalized for 5 days with respiratory issues. He comes back to clinic with no open wounds on his bilateral lower legs. During the hospitalization he did have venous studies. There was no DVT in the right leg and no suggestion of venous reflux. No DVT in the left leg and no suggestion of venous reflux either. Objective AYDYN, TESTERMAN ([id number]) Constitutional Sitting or standing Blood Pressure is within target range for patient.. Pulse regular and within target range for  patient.Marland Kitchen Respirations regular, non- labored and within target range.. Temperature is normal and within the target range for the patient.Marland Kitchen appears in no distress. Vitals Time Taken: 10:29 AM, Height: 72 in, Weight: 297 lbs, BMI: 40.3, Temperature: 98.3 F, Pulse: 59 bpm, Respiratory Rate: 16 breaths/min, Blood Pressure: 134/73  mmHg. Respiratory Patient has O2 on but no clear respiratory distress. General Notes: Wound exam; there is no open wound. The patient has severe bilateral lymphedema with skin changes secondary to this. His edema is not really too bad. No pitting edema Assessment Active Problems ICD-10 Lymphedema, not elsewhere classified Type 2 diabetes mellitus with other skin ulcer Venous insufficiency (chronic) (peripheral) Plan Follow-up Appointments: Return Appointment in 1 week. Edema Control: Patient to wear own Velcro compression garment. - Order Fabian November 4000-Biateral Other: - Tubi-grip G until wraps come in. 1. The patient had presumably 20/30 mmHg stockings from elastic therapy but they could not get them on either the patient or his wife 2. They also bought some zippered stockings on the Internet could not make these work either. 3. We have suggested external juxta lite stockings and we have ordered them with their permission 4. We will put Tubigrip on until he comes in next week. we will look at his legs again inspect for wounds and show them how to use the juxta lite stockings then. Electronic Signature(s) Signed: 07/07/2020 4:21:24 PM By: Baltazar Najjar MD Entered By: Baltazar Najjar on 07/07/2020 12:13:28 Victor Daniels ([id number]) -------------------------------------------------------------------------------- SuperBill Details Patient Name: EATHEN, Victor Daniels. Date of Service: 07/07/2020 Medical Record Number: [id number] Patient Account Number: [id number] Date of Birth/Sex: 1946/09/03 (74 y.o. M) Treating RN: Primary Care Provider: Duncan Dull Other Clinician: Referring Provider: Duncan Dull Treating Provider/Extender: Altamese [place] in Treatment: 2 Diagnosis Coding ICD-10 Codes Code Description I89.0 Lymphedema, not elsewhere classified E11.622 Type 2 diabetes mellitus with other skin ulcer I87.2 Venous insufficiency (chronic) (peripheral) Facility  Procedures CPT4 Code: 63016010 Description: [phone] - WOUND CARE VISIT-LEV 2 EST PT Modifier: Quantity: 1 Physician Procedures CPT4 Code: 5732202 Description: 99213 - WC PHYS LEVEL 3 - EST PT Modifier: Quantity: 1 CPT4 Code: Description: ICD-10 Diagnosis Description I89.0 Lymphedema, not elsewhere classified E11.622 Type 2 diabetes mellitus with other skin ulcer I87.2 Venous insufficiency (chronic) (peripheral) Modifier: Quantity: Electronic Signature(s) Signed: 07/07/2020 4:21:24 PM By: Baltazar Najjar MD Entered By: Baltazar Najjar on 07/07/2020 12:13:51

## 2020-07-09 ENCOUNTER — Other Ambulatory Visit: Payer: Self-pay

## 2020-07-09 ENCOUNTER — Encounter (INDEPENDENT_AMBULATORY_CARE_PROVIDER_SITE_OTHER): Payer: Medicare HMO

## 2020-07-09 ENCOUNTER — Ambulatory Visit: Payer: Medicare HMO | Admitting: Urology

## 2020-07-09 ENCOUNTER — Encounter: Payer: Self-pay | Admitting: Urology

## 2020-07-09 VITALS — BP 150/89 | HR 78 | Ht 72.0 in | Wt 290.0 lb

## 2020-07-09 DIAGNOSIS — N401 Enlarged prostate with lower urinary tract symptoms: Secondary | ICD-10-CM

## 2020-07-12 ENCOUNTER — Ambulatory Visit: Payer: Medicare HMO | Admitting: Physician Assistant

## 2020-07-12 LAB — MICROSCOPIC EXAMINATION
Bacteria, UA: NONE SEEN
RBC, Urine: NONE SEEN /hpf (ref 0–2)

## 2020-07-12 LAB — URINALYSIS, COMPLETE
Bilirubin, UA: NEGATIVE
Glucose, UA: NEGATIVE
Ketones, UA: NEGATIVE
Nitrite, UA: NEGATIVE
RBC, UA: NEGATIVE
Specific Gravity, UA: 1.02 (ref 1.005–1.030)
Urobilinogen, Ur: 0.2 mg/dL (ref 0.2–1.0)
pH, UA: 7 (ref 5.0–7.5)

## 2020-07-13 ENCOUNTER — Other Ambulatory Visit: Payer: Self-pay | Admitting: *Deleted

## 2020-07-13 NOTE — Patient Outreach (Signed)
[facility]) Care Management  [facility] Care Manager  07/13/2020   Victor Daniels 03-31-1946 [id number]   Telephone assessment   Referral Date ; 06/29/20 Referral source:[employer] discharge notification  Date of Admission :06/21/20 Diagnosis : Acute on chronic heart failure , atrial fib  Date of Discharge: 06/25/20 Facility :[facility]  Insurance:[employer]  Transition of care by PCP   Subjective:  Unsuccessful outreach call to patient , no answer able to leave a HIPAA compliant message for return call.     Plan Will plan follow up call in the next 4 business days if no return call on today.    [name], RN, BSN  [facility] Care Management,Care Management Coordinator  [phone]- Mobile [phone]- Toll Free Main Office   Plan:

## 2020-07-16 ENCOUNTER — Other Ambulatory Visit: Payer: Self-pay | Admitting: *Deleted

## 2020-07-16 NOTE — Patient Outreach (Signed)
[facility]) Care Management  [facility] Care Manager  07/16/2020   PAU BANH [birth year] [id number]  Telephone assessmentfollow up call.   Referral Date; 06/29/20 Referral source:[employer] discharge notification  Date of Admission :06/21/20 Diagnosis : Acute on chronic heart failure , atrial fib  Date of Discharge: 06/25/20 Facility :[facility]  Insurance:[employer] Transition of care by PCP  Subjective:  Unsuccessful outreach call to patient , no answer able to leave a HIPAA compliant message for return call.   Plan Will attempt return call in the next 4 business days if no return call on today.    [name], RN, BSN  [facility] Care Management,Care Management Coordinator  [phone]- Mobile [phone]- Toll Free Main Office

## 2020-07-19 ENCOUNTER — Telehealth: Payer: Self-pay | Admitting: Internal Medicine

## 2020-07-19 ENCOUNTER — Encounter: Payer: Self-pay | Admitting: Internal Medicine

## 2020-07-19 DIAGNOSIS — R310 Gross hematuria: Secondary | ICD-10-CM | POA: Insufficient documentation

## 2020-07-19 NOTE — Telephone Encounter (Signed)
Patient stated he wanted to let Dr Darrick Huntsman know that he has been having blood in urine since increase of fluid pill. Since he has dropped the dosage he has noticed the blood not in his urine. He w2ants to give it a few days before deciding to be seen for it. He stated he has an appointment with Dr [name] next week. Patient currently denied an appointment to be seen.

## 2020-07-19 NOTE — Telephone Encounter (Signed)
Pt called and said that he has been having blood in his urine for the last couple days

## 2020-07-21 ENCOUNTER — Other Ambulatory Visit: Payer: Self-pay | Admitting: *Deleted

## 2020-07-21 NOTE — Patient Outreach (Signed)
[facility]) Care Management  [facility] Care Manager  07/21/2020   Victor Daniels [birth year] [id number]    Telephone assessment    Referral Date ; 06/29/20 Referral source:[employer] discharge notification  Date of Admission :06/21/20 Diagnosis : Acute on chronic heart failure , atrial fib  Date of Discharge: 06/25/20 Facility :[facility]  Insurance:[employer]  Transition of care by PCP   Subjective:  Successful follow up call to patient , reports that he is doing alright , just finished lunch a little [employer] pizza.  Patient states that he has decreased swelling in his legs. No worsening of shortness of breath. Reports that he hasn't made it to [employer] to get new scales.  He discussed cutting back on his fluid pill recently, from 40 mg to 20 mg. due to frequency of going to the bathroom . He states that he has contacted Dr.Tullo office regarding this matter.  He discussed recent appointment with urology and having a procedure done states not much improvement with urine flow he has noted "little bit of blood in urine and has contacted PCP office per his report.He acknowledges being on Eliquis blood thinner and importance of notifying MD .He discussed having upcoming appointment with Dr. [name] .      Encounter Medications:  Outpatient Encounter Medications as of 07/21/2020  Medication Sig  . apixaban (ELIQUIS) 5 MG TABS tablet Take 1 tablet (5 mg total) by mouth 2 (two) times daily.  . carvedilol (COREG) 6.25 MG tablet Take 1 tablet (6.25 mg total) by mouth 2 (two) times daily with a meal.  . FeFum-FePo-FA-B Cmp-C-Zn-Mn-Cu (SE-TAN PLUS) 162-115.2-1 MG CAPS Take 1 capsule by mouth every morning.  . ferrous fumarate-iron polysaccharide complex (TANDEM) 162-115.2 MG CAPS capsule Take 1 capsule by mouth daily with breakfast.  . Fluticasone-Umeclidin-Vilant (TRELEGY ELLIPTA) 100-62.5-25 MCG/INH AEPB Inhale 1 puff into the lungs daily.  . furosemide  (LASIX) 40 MG tablet Take 1 tablet (40 mg total) by mouth daily.  . hydrocerin (EUCERIN) CREA Apply 1 application topically daily. Apply Eucerin to bilat legs Q day after washing with soap and water and roughly towel-drying to assist with removal of loose skin.  Marland Kitchen losartan (COZAAR) 25 MG tablet Take 1 tablet (25 mg total) by mouth daily.  . Multiple Vitamin (MULTIVITAMIN) capsule Take 1 capsule by mouth daily.  . nitroGLYCERIN (NITROSTAT) 0.4 MG SL tablet Place 1 tablet (0.4 mg total) under the tongue every 5 (five) minutes as needed for chest pain. Maximum dose 3 tablets  . OXYGEN Inhale 2 L into the lungs 3 (three) times daily as needed (shortness of breath).   . potassium chloride SA (KLOR-CON) 20 MEQ tablet Take 1 tablet (20 mEq total) by mouth daily.  . simvastatin (ZOCOR) 20 MG tablet Take 1 tablet (20 mg total) by mouth at bedtime.  . tamsulosin (FLOMAX) 0.4 MG CAPS capsule Take 1 capsule (0.4 mg total) by mouth daily.  . VENTOLIN HFA 108 (90 Base) MCG/ACT inhaler INHALE 2 PUFFS EVERY 6 HOURS AS NEEDED FOR WHEEZING OR SHORTNESS OF BREATH  . vitamin B-12 (CYANOCOBALAMIN) 1000 MCG tablet Take 1,000 mcg by mouth daily.   No facility-administered encounter medications on file as of 07/21/2020.    Functional Status:  In your present state of health, do you have any difficulty performing the following activities: 07/06/2020 06/21/2020  Hearing? N N  Vision? N N  Difficulty concentrating or making decisions? N N  Walking or climbing stairs? [name]  Comment uses walker -  Dressing or bathing? Y N  Comment wife helps -  Doing errands, shopping? Y N  Comment wife assist -  Quarry manager and eating ? N -  Using the Toilet? N -  In the past six months, have you accidently leaked urine? Y -  Comment wears depends -  Do you have problems with loss of bowel control? N -  Managing your Medications? Y -  Comment wife helps -  Managing your Finances? N -  Housekeeping or managing your Housekeeping?  Y -  Comment wife does housekeeping -  Some recent data might be hidden    Fall/Depression Screening: Fall Risk  07/06/2020 07/01/2020 05/21/2020  Falls in the past year? 1 - 1  Number falls in past yr: 1 - 0  Injury with Fall? 0 - 0  Risk for fall due to : History of fall(s);Impaired balance/gait - -  Follow up Falls prevention discussed Falls evaluation completed Falls evaluation completed   PHQ 2/9 Scores 06/29/2020 03/08/2020 05/01/2019 03/06/2019 07/23/2018 05/17/2018 02/26/2018  PHQ - 2 Score 1 0 0 0 0 0 0  PHQ- 9 Score - - 0 - - - -    Assessment:   Heart failure  Not consistent with monitoring weights,  Needs new scales denies worsening symptoms of swelling shortness of breath .  He will benefit from ongoing education support as agreeable.  He states that he is not attending  heart failure clinic appointment scheduled for this week. , agreeable to continued follow up with Dr. Mariah Milling does not want to involve another doctor.  Brief follow up call to patient as patient ended call.     Plan:  Attempted to offer patient assistance with getting new scales prior to patient ending call, return call unsuccessful.  Will plan return call in the next month.   [facility] CM Care Plan Problem One     Most Recent Value  Care Plan Problem One At risk for readmission related to recent disccharge due to diagnosis of heart faiilure and Atriial fib   Role Documenting the Problem One Care Management Telephonic Coordinator  Care Plan for Problem One Active  [facility] Long Term Goal  Patient will not report hospital readmission over the next 60 days   [facility] Long Term Goal Start Date 06/29/20  Interventions for Problem One Long Term Goal Reinforced taking medications as prescribed,, notifying MD sooner of new concerns  and scheduling follow up visit. Reviewed recent diagnosis of Atrial fib and new medication Eliquis and stressed importance of notifying sooner of noted bleeding.    [facility] CM Short Term Goal #1  Over the next  30 days patient will be able to report monitoring and keeping a log on daily weights   [facility] CM Short Term Goal #1 Start Date 06/29/20  Interventions for Short Term Goal #1 Discussed benefit of monitoring daily weight, attempted to discuss assisting with getting new scales. will follow up  [facility] CM Short Term Goal #2  Over the next 30 days patient will attend all medical appointments   [facility] CM Short Term Goal #2 Start Date 06/29/20  Interventions for Short Term Goal #2 Encouraged scheduling sooner follow up visit with Dr. Mariah Milling.  [facility] CM Short Term Goal #3 Patient will be able to verbalize 3 signs & symptoms in yellow zone Heart failure to report to MD over the next 30 days   [facility] CM Short Term Goal #3 Start Date 07/06/20  Interventions for Short Tern Goal #3 Reinforced education  in signs and symptoms of heart failure and action plan       [name], RN, BSN  [facility] Care Management,Care Management Coordinator  [phone]- Mobile [phone]- Toll Free Main Office

## 2020-07-23 ENCOUNTER — Other Ambulatory Visit: Payer: Self-pay

## 2020-07-23 ENCOUNTER — Emergency Department: Payer: Medicare HMO

## 2020-07-23 ENCOUNTER — Emergency Department (HOSPITAL_COMMUNITY): Payer: Medicare HMO

## 2020-07-23 ENCOUNTER — Ambulatory Visit: Payer: Medicare HMO | Admitting: Family

## 2020-07-23 ENCOUNTER — Inpatient Hospital Stay
Admission: EM | Admit: 2020-07-23 | Discharge: 2020-07-29 | DRG: 291 | Disposition: A | Discharge status: Skilled Nursing Facility | Payer: Medicare HMO | Attending: Internal Medicine | Admitting: Internal Medicine

## 2020-07-23 DIAGNOSIS — J441 Chronic obstructive pulmonary disease with (acute) exacerbation: Secondary | ICD-10-CM | POA: Diagnosis not present

## 2020-07-23 DIAGNOSIS — I255 Ischemic cardiomyopathy: Secondary | ICD-10-CM | POA: Diagnosis present

## 2020-07-23 DIAGNOSIS — J431 Panlobular emphysema: Secondary | ICD-10-CM

## 2020-07-23 DIAGNOSIS — E785 Hyperlipidemia, unspecified: Secondary | ICD-10-CM | POA: Diagnosis present

## 2020-07-23 DIAGNOSIS — Z9981 Dependence on supplemental oxygen: Secondary | ICD-10-CM | POA: Diagnosis not present

## 2020-07-23 DIAGNOSIS — I10 Essential (primary) hypertension: Secondary | ICD-10-CM

## 2020-07-23 DIAGNOSIS — G4733 Obstructive sleep apnea (adult) (pediatric): Secondary | ICD-10-CM | POA: Diagnosis not present

## 2020-07-23 DIAGNOSIS — I252 Old myocardial infarction: Secondary | ICD-10-CM

## 2020-07-23 DIAGNOSIS — J440 Chronic obstructive pulmonary disease with acute lower respiratory infection: Secondary | ICD-10-CM | POA: Diagnosis present

## 2020-07-23 DIAGNOSIS — D509 Iron deficiency anemia, unspecified: Secondary | ICD-10-CM | POA: Diagnosis present

## 2020-07-23 DIAGNOSIS — G4730 Sleep apnea, unspecified: Secondary | ICD-10-CM | POA: Diagnosis present

## 2020-07-23 DIAGNOSIS — Z87891 Personal history of nicotine dependence: Secondary | ICD-10-CM

## 2020-07-23 DIAGNOSIS — J209 Acute bronchitis, unspecified: Secondary | ICD-10-CM | POA: Diagnosis present

## 2020-07-23 DIAGNOSIS — I5033 Acute on chronic diastolic (congestive) heart failure: Secondary | ICD-10-CM | POA: Diagnosis not present

## 2020-07-23 DIAGNOSIS — D72829 Elevated white blood cell count, unspecified: Secondary | ICD-10-CM | POA: Diagnosis not present

## 2020-07-23 DIAGNOSIS — R069 Unspecified abnormalities of breathing: Secondary | ICD-10-CM | POA: Diagnosis not present

## 2020-07-23 DIAGNOSIS — R0602 Shortness of breath: Secondary | ICD-10-CM | POA: Diagnosis not present

## 2020-07-23 DIAGNOSIS — I82409 Acute embolism and thrombosis of unspecified deep veins of unspecified lower extremity: Secondary | ICD-10-CM | POA: Diagnosis present

## 2020-07-23 DIAGNOSIS — I110 Hypertensive heart disease with heart failure: Secondary | ICD-10-CM | POA: Diagnosis not present

## 2020-07-23 DIAGNOSIS — Z7901 Long term (current) use of anticoagulants: Secondary | ICD-10-CM

## 2020-07-23 DIAGNOSIS — Z6841 Body Mass Index (BMI) 40.0 and over, adult: Secondary | ICD-10-CM

## 2020-07-23 DIAGNOSIS — E669 Obesity, unspecified: Secondary | ICD-10-CM | POA: Diagnosis present

## 2020-07-23 DIAGNOSIS — I2510 Atherosclerotic heart disease of native coronary artery without angina pectoris: Secondary | ICD-10-CM | POA: Diagnosis not present

## 2020-07-23 DIAGNOSIS — I509 Heart failure, unspecified: Secondary | ICD-10-CM | POA: Diagnosis not present

## 2020-07-23 DIAGNOSIS — Z20822 Contact with and (suspected) exposure to covid-19: Secondary | ICD-10-CM | POA: Diagnosis not present

## 2020-07-23 DIAGNOSIS — G8929 Other chronic pain: Secondary | ICD-10-CM | POA: Diagnosis present

## 2020-07-23 DIAGNOSIS — Z79899 Other long term (current) drug therapy: Secondary | ICD-10-CM | POA: Diagnosis not present

## 2020-07-23 DIAGNOSIS — Z7401 Bed confinement status: Secondary | ICD-10-CM | POA: Diagnosis not present

## 2020-07-23 DIAGNOSIS — F40240 Claustrophobia: Secondary | ICD-10-CM | POA: Diagnosis present

## 2020-07-23 DIAGNOSIS — N1831 Chronic kidney disease, stage 3a: Secondary | ICD-10-CM | POA: Diagnosis not present

## 2020-07-23 DIAGNOSIS — R0689 Other abnormalities of breathing: Secondary | ICD-10-CM | POA: Diagnosis not present

## 2020-07-23 DIAGNOSIS — E876 Hypokalemia: Secondary | ICD-10-CM | POA: Diagnosis not present

## 2020-07-23 DIAGNOSIS — J449 Chronic obstructive pulmonary disease, unspecified: Secondary | ICD-10-CM | POA: Diagnosis not present

## 2020-07-23 DIAGNOSIS — R0600 Dyspnea, unspecified: Secondary | ICD-10-CM

## 2020-07-23 DIAGNOSIS — R0902 Hypoxemia: Secondary | ICD-10-CM | POA: Diagnosis not present

## 2020-07-23 DIAGNOSIS — I639 Cerebral infarction, unspecified: Secondary | ICD-10-CM | POA: Diagnosis present

## 2020-07-23 DIAGNOSIS — N182 Chronic kidney disease, stage 2 (mild): Secondary | ICD-10-CM | POA: Diagnosis not present

## 2020-07-23 DIAGNOSIS — I4819 Other persistent atrial fibrillation: Secondary | ICD-10-CM | POA: Diagnosis present

## 2020-07-23 DIAGNOSIS — J9622 Acute and chronic respiratory failure with hypercapnia: Secondary | ICD-10-CM | POA: Diagnosis present

## 2020-07-23 DIAGNOSIS — I959 Hypotension, unspecified: Secondary | ICD-10-CM | POA: Diagnosis not present

## 2020-07-23 DIAGNOSIS — M545 Low back pain: Secondary | ICD-10-CM | POA: Diagnosis present

## 2020-07-23 DIAGNOSIS — E1122 Type 2 diabetes mellitus with diabetic chronic kidney disease: Secondary | ICD-10-CM | POA: Diagnosis present

## 2020-07-23 DIAGNOSIS — Z905 Acquired absence of kidney: Secondary | ICD-10-CM

## 2020-07-23 DIAGNOSIS — Z7951 Long term (current) use of inhaled steroids: Secondary | ICD-10-CM

## 2020-07-23 DIAGNOSIS — Z8673 Personal history of transient ischemic attack (TIA), and cerebral infarction without residual deficits: Secondary | ICD-10-CM

## 2020-07-23 DIAGNOSIS — I5023 Acute on chronic systolic (congestive) heart failure: Secondary | ICD-10-CM | POA: Diagnosis not present

## 2020-07-23 DIAGNOSIS — I4891 Unspecified atrial fibrillation: Secondary | ICD-10-CM | POA: Diagnosis present

## 2020-07-23 DIAGNOSIS — Z96649 Presence of unspecified artificial hip joint: Secondary | ICD-10-CM | POA: Diagnosis present

## 2020-07-23 DIAGNOSIS — M2550 Pain in unspecified joint: Secondary | ICD-10-CM | POA: Diagnosis not present

## 2020-07-23 DIAGNOSIS — J9621 Acute and chronic respiratory failure with hypoxia: Secondary | ICD-10-CM | POA: Diagnosis present

## 2020-07-23 DIAGNOSIS — I130 Hypertensive heart and chronic kidney disease with heart failure and stage 1 through stage 4 chronic kidney disease, or unspecified chronic kidney disease: Principal | ICD-10-CM | POA: Diagnosis present

## 2020-07-23 DIAGNOSIS — E1151 Type 2 diabetes mellitus with diabetic peripheral angiopathy without gangrene: Secondary | ICD-10-CM | POA: Diagnosis present

## 2020-07-23 DIAGNOSIS — Z86718 Personal history of other venous thrombosis and embolism: Secondary | ICD-10-CM

## 2020-07-23 DIAGNOSIS — I517 Cardiomegaly: Secondary | ICD-10-CM | POA: Diagnosis not present

## 2020-07-23 DIAGNOSIS — N1832 Chronic kidney disease, stage 3b: Secondary | ICD-10-CM | POA: Diagnosis not present

## 2020-07-23 DIAGNOSIS — J90 Pleural effusion, not elsewhere classified: Secondary | ICD-10-CM | POA: Diagnosis not present

## 2020-07-23 LAB — CBC
HCT: 38.2 % — ABNORMAL LOW (ref 39.0–52.0)
Hemoglobin: 11 g/dL — ABNORMAL LOW (ref 13.0–17.0)
MCH: 27.7 pg (ref 26.0–34.0)
MCHC: 28.8 g/dL — ABNORMAL LOW (ref 30.0–36.0)
MCV: 96.2 fL (ref 80.0–100.0)
Platelets: 205 10*3/uL (ref 150–400)
RBC: 3.97 MIL/uL — ABNORMAL LOW (ref 4.22–5.81)
RDW: 15.8 % — ABNORMAL HIGH (ref 11.5–15.5)
WBC: 17.1 10*3/uL — ABNORMAL HIGH (ref 4.0–10.5)
nRBC: 0 % (ref 0.0–0.2)

## 2020-07-23 LAB — BLOOD GAS, VENOUS
Acid-Base Excess: 17.9 mmol/L — ABNORMAL HIGH (ref 0.0–2.0)
Bicarbonate: 49.6 mmol/L — ABNORMAL HIGH (ref 20.0–28.0)
O2 Saturation: 63.5 %
Patient temperature: 37
pCO2, Ven: 108 mmHg (ref 44.0–60.0)
pO2, Ven: 42 mmHg (ref 32.0–45.0)

## 2020-07-23 LAB — BRAIN NATRIURETIC PEPTIDE: B Natriuretic Peptide: 567 pg/mL — ABNORMAL HIGH (ref 0.0–100.0)

## 2020-07-23 LAB — URINALYSIS, COMPLETE (UACMP) WITH MICROSCOPIC
Bilirubin Urine: NEGATIVE
Glucose, UA: NEGATIVE mg/dL
Hgb urine dipstick: NEGATIVE
Ketones, ur: NEGATIVE mg/dL
Leukocytes,Ua: NEGATIVE
Nitrite: NEGATIVE
Protein, ur: 100 mg/dL — AB
Specific Gravity, Urine: 1.023 (ref 1.005–1.030)
pH: 5 (ref 5.0–8.0)

## 2020-07-23 LAB — COMPREHENSIVE METABOLIC PANEL
ALT: 12 U/L (ref 0–44)
AST: 16 U/L (ref 15–41)
Albumin: 3.3 g/dL — ABNORMAL LOW (ref 3.5–5.0)
Alkaline Phosphatase: 86 U/L (ref 38–126)
Anion gap: 8 (ref 5–15)
BUN: 16 mg/dL (ref 8–23)
CO2: 39 mmol/L — ABNORMAL HIGH (ref 22–32)
Calcium: 9 mg/dL (ref 8.9–10.3)
Chloride: 94 mmol/L — ABNORMAL LOW (ref 98–111)
Creatinine, Ser: 1.28 mg/dL — ABNORMAL HIGH (ref 0.61–1.24)
GFR calc Af Amer: >60 mL/min (ref >60)
GFR calc non Af Amer: 55 mL/min — ABNORMAL LOW (ref >60)
Glucose, Bld: 161 mg/dL — ABNORMAL HIGH (ref 70–99)
Potassium: 3.8 mmol/L (ref 3.5–5.1)
Sodium: 141 mmol/L (ref 135–145)
Total Bilirubin: 1.5 mg/dL — ABNORMAL HIGH (ref 0.3–1.2)
Total Protein: 7.8 g/dL (ref 6.5–8.1)

## 2020-07-23 LAB — SARS CORONAVIRUS 2 BY RT PCR (HOSPITAL ORDER, PERFORMED IN ~~LOC~~ HOSPITAL LAB): SARS Coronavirus 2: NEGATIVE

## 2020-07-23 LAB — MAGNESIUM: Magnesium: 1.9 mg/dL (ref 1.7–2.4)

## 2020-07-23 LAB — PROTIME-INR
INR: 1.3 — ABNORMAL HIGH (ref 0.8–1.2)
Prothrombin Time: 15.2 seconds (ref 11.4–15.2)

## 2020-07-23 LAB — LACTIC ACID, PLASMA: Lactic Acid, Venous: 1.9 mmol/L (ref 0.5–1.9)

## 2020-07-23 MED ORDER — POLYSACCHARIDE IRON COMPLEX 150 MG PO CAPS
150.0000 mg | ORAL_CAPSULE | Freq: Every day | ORAL | Status: DC
  Administered 2020-07-24 – 2020-07-29 (×6): 150 mg via ORAL
  Filled 2020-07-23 (×6): qty 1

## 2020-07-23 MED ORDER — VITAMIN B-12 1000 MCG PO TABS
1000.0000 ug | ORAL_TABLET | Freq: Every day | ORAL | Status: DC
  Administered 2020-07-23 – 2020-07-29 (×7): 1000 ug via ORAL
  Filled 2020-07-23 (×7): qty 1

## 2020-07-23 MED ORDER — SIMVASTATIN 20 MG PO TABS
20.0000 mg | ORAL_TABLET | Freq: Every day | ORAL | Status: DC
  Administered 2020-07-23 – 2020-07-28 (×6): 20 mg via ORAL
  Filled 2020-07-23 (×6): qty 1

## 2020-07-23 MED ORDER — ONDANSETRON HCL 4 MG/2ML IJ SOLN: 4.0000 mg | Freq: Three times a day (TID) | INTRAMUSCULAR | Status: DC | PRN

## 2020-07-23 MED ORDER — HYDRALAZINE HCL 20 MG/ML IJ SOLN: 5.0000 mg | INTRAMUSCULAR | Status: DC | PRN

## 2020-07-23 MED ORDER — AZITHROMYCIN 250 MG PO TABS
250.0000 mg | ORAL_TABLET | Freq: Every day | ORAL | Status: AC
  Administered 2020-07-24 – 2020-07-27 (×4): 250 mg via ORAL
  Filled 2020-07-23 (×4): qty 1

## 2020-07-23 MED ORDER — LOSARTAN POTASSIUM 25 MG PO TABS
25.0000 mg | ORAL_TABLET | Freq: Every day | ORAL | Status: DC
  Administered 2020-07-23 – 2020-07-29 (×7): 25 mg via ORAL
  Filled 2020-07-23 (×7): qty 1

## 2020-07-23 MED ORDER — SODIUM CHLORIDE 0.9% FLUSH: 3.0000 mL | INTRAVENOUS | Status: DC | PRN

## 2020-07-23 MED ORDER — FUROSEMIDE 10 MG/ML IJ SOLN
60.0000 mg | Freq: Once | INTRAMUSCULAR | Status: AC
  Administered 2020-07-23: 60 mg via INTRAVENOUS
  Filled 2020-07-23: qty 8

## 2020-07-23 MED ORDER — FUROSEMIDE 10 MG/ML IJ SOLN
40.0000 mg | Freq: Two times a day (BID) | INTRAMUSCULAR | Status: DC
  Administered 2020-07-24 – 2020-07-27 (×6): 40 mg via INTRAVENOUS
  Filled 2020-07-23 (×6): qty 4

## 2020-07-23 MED ORDER — DM-GUAIFENESIN ER 30-600 MG PO TB12: 1.0000 | ORAL_TABLET | Freq: Two times a day (BID) | ORAL | Status: DC | PRN

## 2020-07-23 MED ORDER — METHYLPREDNISOLONE SODIUM SUCC 125 MG IJ SOLR
125.0000 mg | INTRAMUSCULAR | Status: AC
  Administered 2020-07-23: 125 mg via INTRAVENOUS
  Filled 2020-07-23: qty 2

## 2020-07-23 MED ORDER — TAMSULOSIN HCL 0.4 MG PO CAPS
0.4000 mg | ORAL_CAPSULE | Freq: Every day | ORAL | Status: DC
  Administered 2020-07-23 – 2020-07-29 (×7): 0.4 mg via ORAL
  Filled 2020-07-23 (×7): qty 1

## 2020-07-23 MED ORDER — SODIUM CHLORIDE 0.9 % IV SOLN: 250.0000 mL | INTRAVENOUS | Status: DC | PRN

## 2020-07-23 MED ORDER — ACETAMINOPHEN 325 MG PO TABS: 650.0000 mg | ORAL_TABLET | Freq: Four times a day (QID) | ORAL | Status: DC | PRN

## 2020-07-23 MED ORDER — OXYCODONE HCL 5 MG PO TABS
15.0000 mg | ORAL_TABLET | Freq: Four times a day (QID) | ORAL | Status: DC | PRN
  Administered 2020-07-23 – 2020-07-29 (×11): 15 mg via ORAL
  Filled 2020-07-23 (×12): qty 3

## 2020-07-23 MED ORDER — METHYLPREDNISOLONE SODIUM SUCC 40 MG IJ SOLR
40.0000 mg | Freq: Two times a day (BID) | INTRAMUSCULAR | Status: DC
  Administered 2020-07-24 – 2020-07-25 (×2): 40 mg via INTRAVENOUS
  Filled 2020-07-23 (×2): qty 1

## 2020-07-23 MED ORDER — ADULT MULTIVITAMIN W/MINERALS CH
1.0000 | ORAL_TABLET | Freq: Every day | ORAL | Status: DC
  Administered 2020-07-23 – 2020-07-29 (×7): 1 via ORAL
  Filled 2020-07-23 (×7): qty 1

## 2020-07-23 MED ORDER — ALBUTEROL SULFATE (2.5 MG/3ML) 0.083% IN NEBU: 2.5000 mg | INHALATION_SOLUTION | RESPIRATORY_TRACT | Status: DC | PRN

## 2020-07-23 MED ORDER — CARVEDILOL 6.25 MG PO TABS
6.2500 mg | ORAL_TABLET | Freq: Two times a day (BID) | ORAL | Status: DC
  Administered 2020-07-24 – 2020-07-25 (×2): 6.25 mg via ORAL
  Filled 2020-07-23 (×2): qty 1

## 2020-07-23 MED ORDER — APIXABAN 5 MG PO TABS
5.0000 mg | ORAL_TABLET | Freq: Two times a day (BID) | ORAL | Status: DC
  Administered 2020-07-23 – 2020-07-29 (×12): 5 mg via ORAL
  Filled 2020-07-23 (×13): qty 1

## 2020-07-23 MED ORDER — SODIUM CHLORIDE 0.9% FLUSH
3.0000 mL | Freq: Two times a day (BID) | INTRAVENOUS | Status: DC
  Administered 2020-07-23 – 2020-07-28 (×11): 3 mL via INTRAVENOUS

## 2020-07-23 MED ORDER — IPRATROPIUM-ALBUTEROL 0.5-2.5 (3) MG/3ML IN SOLN
3.0000 mL | RESPIRATORY_TRACT | Status: DC
  Administered 2020-07-23 – 2020-07-24 (×4): 3 mL via RESPIRATORY_TRACT
  Filled 2020-07-23 (×4): qty 3

## 2020-07-23 MED ORDER — IPRATROPIUM-ALBUTEROL 0.5-2.5 (3) MG/3ML IN SOLN
3.0000 mL | RESPIRATORY_TRACT | Status: DC
  Administered 2020-07-23: 3 mL via RESPIRATORY_TRACT
  Filled 2020-07-23: qty 3

## 2020-07-23 MED ORDER — LORAZEPAM 2 MG/ML IJ SOLN
0.5000 mg | Freq: Once | INTRAMUSCULAR | Status: AC
  Administered 2020-07-23: 0.5 mg via INTRAVENOUS
  Filled 2020-07-23: qty 1

## 2020-07-23 MED ORDER — AZITHROMYCIN 500 MG PO TABS
500.0000 mg | ORAL_TABLET | Freq: Every day | ORAL | Status: AC
  Administered 2020-07-23: 500 mg via ORAL
  Filled 2020-07-23: qty 1

## 2020-07-23 MED ORDER — NITROGLYCERIN 0.4 MG SL SUBL: 0.4000 mg | SUBLINGUAL_TABLET | SUBLINGUAL | Status: DC | PRN

## 2020-07-23 MED ORDER — IPRATROPIUM-ALBUTEROL 0.5-2.5 (3) MG/3ML IN SOLN
3.0000 mL | Freq: Once | RESPIRATORY_TRACT | Status: AC
  Administered 2020-07-23: 3 mL via RESPIRATORY_TRACT
  Filled 2020-07-23: qty 3

## 2020-07-23 NOTE — ED Triage Notes (Signed)
Pt arrives via ems from home with reports of SOB.  Ems reports pt normal work of breathing, part of rt lung removed several years ago. Pt reported increased SOB over last 4 days. Pt was at 89% at home. MD present to assess.   Ems administered albuterol treatment in route. Pt refused rebreather with ems.  Ems unable to obtain pressure 4l - 94%

## 2020-07-23 NOTE — ED Notes (Signed)
Pt became agitated and removed bipap and monitor. bipap placed back onto pt, as well as monitors at this time. Pt also reminded of the importance to keep monitors on. Pt able to be easily redirected.

## 2020-07-23 NOTE — Progress Notes (Addendum)
Pt was admitted on the floor with no signs of distress. Pt alert x 4. VSS. Pt ascom within pt reach and was educated about safety. Will continue to monitor.  Update 2354: Pt exceeds the criteria for BMI (BMI at 40.90). Will notify incoming shift. IWll continue to monitor.  Update 0121: CCMD called and reported HR 32 but now up at 60. Notify NP Jon Billings. Will continue to monitor.  Update 0123: CCMD called and reported HR at 32. Pt was asymptomatic. VSS. Notify B Morrision. Will continue to monitor.

## 2020-07-23 NOTE — ED Notes (Signed)
Pt was feeling anxious and removed bipap machine from face. Pt does not wish to have bipap machine at this time. Pt placed on 4L [place] and maintaining oxygen sat of 98 and above
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
## 2020-07-23 NOTE — ED Notes (Signed)
RR rate recorded at 1330 was inaccurate. Monitor malfunctioned reading 0, correct RR entered at 1332

## 2020-07-23 NOTE — ED Notes (Signed)
Pt able to tolerate PO intake while on [place] and maintain an oxygen saturation around 98% on 3L [place]

## 2020-07-23 NOTE — ED Notes (Signed)
Unable to obtain accurate weight at this time sue to pt status. Per pt, estimated weight around 280lbs
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
## 2020-07-24 DIAGNOSIS — J440 Chronic obstructive pulmonary disease with acute lower respiratory infection: Secondary | ICD-10-CM

## 2020-07-24 DIAGNOSIS — J209 Acute bronchitis, unspecified: Secondary | ICD-10-CM

## 2020-07-24 DIAGNOSIS — N1832 Chronic kidney disease, stage 3b: Secondary | ICD-10-CM

## 2020-07-24 LAB — CBC
HCT: 31.5 % — ABNORMAL LOW (ref 39.0–52.0)
Hemoglobin: 9.4 g/dL — ABNORMAL LOW (ref 13.0–17.0)
MCH: 27.7 pg (ref 26.0–34.0)
MCHC: 29.8 g/dL — ABNORMAL LOW (ref 30.0–36.0)
MCV: 92.9 fL (ref 80.0–100.0)
Platelets: 146 10*3/uL — ABNORMAL LOW (ref 150–400)
RBC: 3.39 MIL/uL — ABNORMAL LOW (ref 4.22–5.81)
RDW: 15.9 % — ABNORMAL HIGH (ref 11.5–15.5)
WBC: 9 10*3/uL (ref 4.0–10.5)
nRBC: 0 % (ref 0.0–0.2)

## 2020-07-24 LAB — BASIC METABOLIC PANEL
Anion gap: 13 (ref 5–15)
BUN: 24 mg/dL — ABNORMAL HIGH (ref 8–23)
CO2: 37 mmol/L — ABNORMAL HIGH (ref 22–32)
Calcium: 8.9 mg/dL (ref 8.9–10.3)
Chloride: 91 mmol/L — ABNORMAL LOW (ref 98–111)
Creatinine, Ser: 1.24 mg/dL (ref 0.61–1.24)
GFR calc Af Amer: >60 mL/min (ref >60)
GFR calc non Af Amer: 57 mL/min — ABNORMAL LOW (ref >60)
Glucose, Bld: 150 mg/dL — ABNORMAL HIGH (ref 70–99)
Potassium: 4.1 mmol/L (ref 3.5–5.1)
Sodium: 141 mmol/L (ref 135–145)

## 2020-07-24 LAB — MAGNESIUM: Magnesium: 2 mg/dL (ref 1.7–2.4)

## 2020-07-24 MED ORDER — ALBUTEROL SULFATE HFA 108 (90 BASE) MCG/ACT IN AERS: 2.0000 | INHALATION_SPRAY | Freq: Four times a day (QID) | RESPIRATORY_TRACT | Status: DC | PRN

## 2020-07-24 MED ORDER — ALBUTEROL SULFATE (2.5 MG/3ML) 0.083% IN NEBU
2.5000 mg | INHALATION_SOLUTION | RESPIRATORY_TRACT | Status: DC | PRN
  Administered 2020-07-24: 2.5 mg via RESPIRATORY_TRACT
  Filled 2020-07-24: qty 3

## 2020-07-24 MED ORDER — FLUTICASONE-UMECLIDIN-VILANT 100-62.5-25 MCG/INH IN AEPB: 1.0000 | INHALATION_SPRAY | Freq: Every day | RESPIRATORY_TRACT | Status: DC

## 2020-07-24 MED ORDER — FLUTICASONE FUROATE-VILANTEROL 100-25 MCG/INH IN AEPB
1.0000 | INHALATION_SPRAY | Freq: Every day | RESPIRATORY_TRACT | Status: DC
  Administered 2020-07-24 – 2020-07-29 (×6): 1 via RESPIRATORY_TRACT
  Filled 2020-07-24 (×2): qty 28

## 2020-07-24 MED ORDER — ALPRAZOLAM 0.25 MG PO TABS
0.2500 mg | ORAL_TABLET | Freq: Three times a day (TID) | ORAL | Status: DC | PRN
  Administered 2020-07-24 – 2020-07-27 (×4): 0.25 mg via ORAL
  Filled 2020-07-24 (×4): qty 1

## 2020-07-24 MED ORDER — IPRATROPIUM-ALBUTEROL 0.5-2.5 (3) MG/3ML IN SOLN
3.0000 mL | Freq: Four times a day (QID) | RESPIRATORY_TRACT | Status: DC
  Administered 2020-07-24 – 2020-07-29 (×20): 3 mL via RESPIRATORY_TRACT
  Filled 2020-07-24 (×20): qty 3

## 2020-07-24 MED ORDER — UMECLIDINIUM BROMIDE 62.5 MCG/INH IN AEPB
1.0000 | INHALATION_SPRAY | Freq: Every day | RESPIRATORY_TRACT | Status: DC
  Administered 2020-07-24 – 2020-07-29 (×6): 1 via RESPIRATORY_TRACT
  Filled 2020-07-24 (×2): qty 7

## 2020-07-24 NOTE — Progress Notes (Signed)
PT Cancellation Note  Patient Details Name: Victor Daniels MRN: [id number] DOB: 07-Apr-1946   Cancelled Treatment:    Reason Eval/Treat Not Completed: Patient declined, no reason specified. Patient reports that he does not want any therapy, he is fine, he also does not want any recommendations for HHPT; he does not want anyone in his home.    [address] [place], [place] DPT 07/24/2020, 12:03 PM

## 2020-07-24 NOTE — Hospital Course (Signed)
Victor Daniels is a 74 y.o. male with medical history significant of hypertension, hyperlipidemia COPD, on 2 L oxygen, stroke, PAD, OSA, CAD, DVT atrial fibrillation on Eliquis, dCHF, alcoholic gastritis, anemia, who presented to the ED on 07/23/20 with 4 days of progressive shortness breath, productive cough and worsening bilateral lower extremity edema.   In the ED, initial O2 sat was 63% on his home oxygen of 2 L/min.  VBG showed hypercarbia with pCO2 of 108.  Started on Bipap in the ED.  Afebrile 99.1, normotensive, tachypneic.  Labs notable for BNP 567, negative Covid-19 PCR, normal lactate 1.9, stable renal function, leukocytosis 17.1k, Hbg 11.0 (from 9.3 earlier this month).  Chest xray showed some pulmonary edema and post-op lung volume reduction changes, stable.  Admitted to hospitalist service for further management of acute on chronic respiratory failure with hypoxia and hypercapnea due to combination of decompensated CHF and acute exacerbation of COPD.

## 2020-07-24 NOTE — Evaluation (Signed)
Occupational Therapy Evaluation Patient Details Name: Victor Daniels MRN: [id number] DOB: 1946/03/19 Today's Date: 07/24/2020    History of Present Illness Victor Daniels is a 74 y.o. male with medical history significant for history of COPD, history of CHF, diet-controlled diabetes mellitus, chronic respiratory failure on 2 L of oxygen history of obstructive sleep apnea, lower extremity venous insufficiency who presents to the ER for evaluation of worsening shortness of breath for the past 2 days associated with orthopnea and bilateral lower extremity swelling.  Pt admitted for acute on chronic respiratory failure.   Clinical Impression   Victor Daniels presents to OT with decreased endurance and limited lower body access/ROM that impacts his ability to safely and independently complete functional tasks.  Prior to admission, pt is mod I in most ADLs, but receives assistance from his wife for lower body dressing 2/2 inability to reach feet.  OTR educated pt on use of sock aid, reacher, and various AE for lower body dressing but pt declines further education.  He reports previously owning AE for LBD but did not find it helpful.  Pt was pleasant and engaged throughout evaluation, but declined to participate in any OOB mobility.  He states just getting up to use the restroom with nursing and is too fatigued to participate again.  OTR educated pt on importance of OOB mobility for endurance and functional strengthening, pt verbalized understanding.  Currently, pt requires setup assist for seated UB ADLs including UB dressing, bathing, feeding, and grooming.  Suspect pt requires min-mod assist for LB dressing and bathing.  Unable to assess functional transfers and ambulation, but suspect pt requires CGA and min verbal cues for safety and use of energy conservation strategies.  OTR educated pt on various energy conservation strategies including pursed lip breathing, frequent rest breaks, and  prioritizing tasks.  Pt will continue to benefit from skilled OT services in acute setting to further assess ADL completion as well as address energy conservation strategies and endurance.  Pt reports not willing to participate in [facility] therapy services, so do not anticipate any further OT needs after discharge.    Follow Up Recommendations  No OT follow up    Equipment Recommendations  None recommended by OT    Recommendations for Other Services       Precautions / Restrictions Precautions Precautions: Fall Restrictions Weight Bearing Restrictions: No Other Position/Activity Restrictions: monitor O2 with activity      Mobility Bed Mobility Overal bed mobility: Needs Assistance             General bed mobility comments: Pt declined OOB mobility, unable to assess.  Pt is mod I per pt report.  Transfers Overall transfer level: Needs assistance               General transfer comment: Pt declined OOB mobility, unable to assess.    Balance Overall balance assessment: Mild deficits observed, not formally tested                                         ADL either performed or assessed with clinical judgement   ADL Overall ADL's : Needs assistance/impaired                                       General ADL Comments: Pt generally  requires setup assist for seated UB ADLs.  Pt requires min-mod A for lower body dressing 2/2 inability to reach feet.  Unable to assess functional transfers and ambulation 2/2 pt decline, but suspect pt requires SBA/CGA for mobility/transfers as well as verbal cues for safety and energy conservation.     Vision Baseline Vision/History: Wears glasses Wears Glasses: At all times Patient Visual Report: No change from baseline       Perception     Praxis      Pertinent Vitals/Pain Pain Assessment: No/denies pain     Hand Dominance Right   Extremity/Trunk Assessment Upper Extremity Assessment Upper  Extremity Assessment: Overall WFL for tasks assessed   Lower Extremity Assessment Lower Extremity Assessment: Defer to PT evaluation       Communication Communication Communication: No difficulties   Cognition Arousal/Alertness: Awake/alert Behavior During Therapy: WFL for tasks assessed/performed Overall Cognitive Status: Within Functional Limits for tasks assessed                                 General Comments: grossly oriented, pleasant and engaged throughout evaluation   General Comments  Pt's HR in 70s and SpO2 = 91% at rest (3L [place])    Exercises Other Exercises Other Exercises: provided education re: OT role and plan of care, fall and safety precautions, self care, energy conservation strategies, and importance of OOB mobility for improved endurance and strengthening   Shoulder Instructions      Home Living Family/patient expects to be discharged to:: Private residence Living Arrangements: Spouse/significant other Available Help at Discharge: Family;Available 24 hours/day Type of Home: House Home Access: Stairs to enter [employer] of Steps: 3.5 inch threshhold, no other steps to enter   Home Layout: One level     Bathroom Shower/Tub: Walk-in shower;Door;Tub/shower unit (pt uses walk in shower)   Firefighter: Standard     Home Equipment: Environmental consultant - 4 wheels;Cane - single point;Shower seat;Toilet riser;Cane - quad;Grab bars - tub/shower          Prior Functioning/Environment Level of Independence: Needs assistance  Gait / Transfers Assistance Needed: Pt intermittently uses no AD, QC, or 4WW based on distance he needs to travel ADL's / Homemaking Assistance Needed: Pt's wife assists with LBD as pt is unable to reach feet.  Pt is mod I in toileting, feeding, grooming, and bathing.  Pt endorses one fall 3 months ago in which he slipped on bathroom tile, now wears gripped footwear in bathroom.   Comments: Pt is not currently driving, his  wife assists with transportation, household maintenance, and medication management.  Pt on home O2.        OT Problem List: Decreased range of motion;Decreased activity tolerance;Impaired balance (sitting and/or standing);Cardiopulmonary status limiting activity;Increased edema      OT Treatment/Interventions: Self-care/ADL training;Therapeutic exercise;Energy conservation;DME and/or AE instruction;Therapeutic activities;Patient/family education;Balance training    OT Goals(Current goals can be found in the care plan section) Acute Rehab OT Goals Patient Stated Goal: to go home OT Goal Formulation: With patient Time For Goal Achievement: 08/07/20 Potential to Achieve Goals: Good  OT Frequency: Min 1X/week   Barriers to D/C:            Co-evaluation              AM-PAC OT "6 Clicks" Daily Activity     Outcome Measure Help from another person eating meals?: None Help from another person taking care  of personal grooming?: None Help from another person toileting, which includes using toliet, bedpan, or urinal?: A Little Help from another person bathing (including washing, rinsing, drying)?: A Little Help from another person to put on and taking off regular upper body clothing?: None Help from another person to put on and taking off regular lower body clothing?: A Lot 6 Click Score: 20   End of Session    Activity Tolerance: Patient limited by fatigue Patient left: in bed;with call bell/phone within reach;with bed alarm set  OT Visit Diagnosis: Other abnormalities of gait and mobility (R26.89)                Time: 1700-1749 OT Time Calculation (min): 23 min Charges:  OT General Charges $OT Visit: 1 Visit OT Evaluation $OT Eval Moderate Complexity: 1 Mod OT Treatments $Self Care/Home Management : 8-22 mins  Kathyrn Drown Bernd Crom, OTR/L 07/24/20, 10:21 AM

## 2020-07-24 NOTE — Plan of Care (Signed)
°  Problem: Clinical Measurements: °Goal: Respiratory complications will improve °Outcome: Progressing °  °Problem: Pain Managment: °Goal: General experience of comfort will improve °Outcome: Progressing °  °Problem: Safety: °Goal: Ability to remain free from injury will improve °Outcome: Progressing °  °

## 2020-07-24 NOTE — Progress Notes (Signed)
PROGRESS NOTE    Victor Daniels   KGU:[id number]  DOB: July 18, 1946  PCP: [name], MD    DOA: 07/23/2020 LOS: 1   Brief Narrative   Victor Daniels is a 74 y.o. male with medical history significant of hypertension, hyperlipidemia COPD, on 2 L oxygen, stroke, PAD, OSA, CAD, DVT atrial fibrillation on Eliquis, dCHF, alcoholic gastritis, anemia, who presented to the ED on 07/23/20 with 4 days of progressive shortness breath, productive cough and worsening bilateral lower extremity edema.   In the ED, initial O2 sat was 63% on his home oxygen of 2 L/min.  VBG showed hypercarbia with pCO2 of 108.  Started on Bipap in the ED.  Afebrile 99.1, normotensive, tachypneic.  Labs notable for BNP 567, negative Covid-19 PCR, normal lactate 1.9, stable renal function, leukocytosis 17.1k, Hbg 11.0 (from 9.3 earlier this month).  Chest xray showed some pulmonary edema and post-op lung volume reduction changes, stable.  Admitted to hospitalist service for further management of acute on chronic respiratory failure with hypoxia and hypercapnea due to combination of decompensated CHF and acute exacerbation of COPD.      Assessment & Plan   Principal Problem:   Acute on chronic respiratory failure with hypoxia (HCC) Active Problems:   Hyperlipidemia   Hypertension   CAD (coronary artery disease)   Iron deficiency anemia   CKD (chronic kidney disease), stage IIIa   Acute on chronic diastolic CHF (congestive heart failure) (HCC)   Atrial fibrillation (HCC)   DVT (deep venous thrombosis) (HCC)   Stroke (HCC)   COPD with acute bronchitis (HCC)   Acute on chronic respiratory failure with hypoxia and hypercarbia - secondary to decompensated CHF and acute exacerbation of COPD.  Presented with expiratory wheezing consistent with COPD in addition to 3+ pitting lower extremity edema and elevated BNP, pulmonary edema on chest x-ray consistent with CHF.  Continue supplemental oxygen to  maintain O2 sat greater than 90%.  Acute on chronic diastolic CHF -recent echo 06/22/2020 showed EF greater than 55%.  Follows with Dr. Mariah Milling.  Cardiology consulted.  Continue IV Lasix 40 mg twice daily.  Daily weights and strict I/O's.  Low-sodium diet with fluid restriction.  Acute exacerbation of COPD /acute bronchitis -present on admission.  Hypercarbic with PCO2 of 108 on admission, placed on BiPAP in the ED.  Continue IV Solu-Medrol at current dose, likely reduce tomorrow.  Z-Pak.  Mucinex.  Scheduled DuoNebs with as needed albuterol nebs.  Incentive spirometry.  Follow-up on blood and sputum cultures.  BiPAP as needed.  Hypertension -continue Coreg and Cozaar.  Hydralazine as needed.  Hyperlipidemia -continue Zocor  Coronary artery disease -stable, no active chest pain.  Continue statin and as needed SL nitro.  Iron deficiency anemia -chronic and stable on admission.  Continue iron supplement.  CBCs to monitor.  CKD stage IIIa -renal function stable, monitor closely while diuresing.  Daily BMP.  Atrial fibrillation -rate controlled.  Continue Coreg and Eliquis.  DVT -continue Eliquis  History of stroke -continue Zocor    Patient BMI: Body mass index is 40.71 kg/m.   DVT prophylaxis:  apixaban (ELIQUIS) tablet 5 mg   Diet:  Diet Orders (From admission, onward)    Start     Ordered   07/23/20 1735  Diet Heart Room service appropriate? Yes; Fluid consistency: Thin; Fluid restriction: 1500 mL Fluid  Diet effective now       Question Answer Comment  Room service appropriate? Yes   Fluid consistency: Thin  Fluid restriction: 1500 mL Fluid      07/23/20 1734            Code Status: Full Code    Subjective 07/24/20    Patient seen at bedside today.  No acute events reported overnight.  He reports feeling almost 100% better since admission.  Notes improvement in his wheezing but still feels tight.  Denies fevers or chills.  Says leg swelling little  better   Disposition Plan & Communication   Status is: Inpatient  Remains inpatient appropriate because:IV treatments appropriate due to intensity of illness or inability to take PO   Dispo: The patient is from: Home              Anticipated d/c is to: Home              Anticipated d/c date is: 3 days              Patient currently is not medically stable to d/c.        Family Communication: None at bedside, will attempt to call   Consults, Procedures, Significant Events   Consultants:   Cardiology  Procedures:   None  Antimicrobials:   None   Objective   Vitals:   07/24/20 0010 07/24/20 0120 07/24/20 0408 07/24/20 0727  BP:  (!) 138/63 123/81   Pulse:  60 73   Resp:   19   Temp:  98.3 F (36.8 C) 98 F (36.7 C)   TempSrc:  Oral Oral   SpO2: 97% 93% 98% 94%  Weight:   (!) 136.2 kg   Height:        Intake/Output Summary (Last 24 hours) at 07/24/2020 0748 Last data filed at 07/24/2020 0411 Gross per 24 hour  Intake 360 ml  Output 1200 ml  Net -840 ml   Filed Weights   07/23/20 0736 07/23/20 1857 07/24/20 0408  Weight: (!) 122.5 kg (!) 136.8 kg (!) 136.2 kg    Physical Exam:  General exam: awake, alert, no acute distress, obese HEENT: moist mucus membranes, hearing grossly normal  Respiratory system: Bilateral crackles, no wheezing or rhonchi, normal respiratory effort on 3 L/min nasal cannula oxygen. Cardiovascular system: normal S1/S2, RRR, tense pitting edema of bilateral lower extremities.   Gastrointestinal system: soft, NT, ND Central nervous system: A&O x4. no gross focal neurologic deficits, normal speech Extremities: moves all, tense pitting edema venous stasis lower extremities Psychiatry: normal mood, congruent affect  Labs   Data Reviewed: I have personally reviewed following labs and imaging studies  CBC: Recent Labs  Lab 07/23/20 0749 07/24/20 0343  WBC 17.1* 9.0  HGB 11.0* 9.4*  HCT 38.2* 31.5*  MCV 96.2 92.9  PLT 205  146*   Basic Metabolic Panel: Recent Labs  Lab 07/23/20 0749 07/24/20 0343  NA 141 141  K 3.8 4.1  CL 94* 91*  CO2 39* 37*  GLUCOSE 161* 150*  BUN 16 24*  CREATININE 1.28* 1.24  CALCIUM 9.0 8.9  MG 1.9 2.0   GFR: Estimated Creatinine Clearance: 75.8 mL/min (by C-G formula based on SCr of 1.24 mg/dL). Liver Function Tests: Recent Labs  Lab 07/23/20 0749  AST 16  ALT 12  ALKPHOS 86  BILITOT 1.5*  PROT 7.8  ALBUMIN 3.3*   No results for input(s): LIPASE, AMYLASE in the last 168 hours. No results for input(s): AMMONIA in the last 168 hours. Coagulation Profile: Recent Labs  Lab 07/23/20 0749  INR 1.3*   Cardiac Enzymes:  No results for input(s): CKTOTAL, CKMB, CKMBINDEX, TROPONINI in the last 168 hours. BNP (last 3 results) No results for input(s): PROBNP in the last 8760 hours. HbA1C: No results for input(s): HGBA1C in the last 72 hours. CBG: No results for input(s): GLUCAP in the last 168 hours. Lipid Profile: No results for input(s): CHOL, HDL, LDLCALC, TRIG, CHOLHDL, LDLDIRECT in the last 72 hours. Thyroid Function Tests: No results for input(s): TSH, T4TOTAL, FREET4, T3FREE, THYROIDAB in the last 72 hours. Anemia Panel: No results for input(s): VITAMINB12, FOLATE, FERRITIN, TIBC, IRON, RETICCTPCT in the last 72 hours. Sepsis Labs: Recent Labs  Lab 07/23/20 0749  LATICACIDVEN 1.9    Recent Results (from the past 240 hour(s))  SARS Coronavirus 2 by RT PCR (hospital order, performed in [facility]) Nasopharyngeal Nasopharyngeal Swab     Status: None   Collection Time: 07/23/20  7:40 AM   Specimen: Nasopharyngeal Swab  Result Value Ref Range Status   SARS Coronavirus 2 NEGATIVE NEGATIVE Final    Comment: (NOTE) SARS-CoV-2 target nucleic acids are NOT DETECTED.  The SARS-CoV-2 RNA is generally detectable in upper and lower respiratory specimens during the acute phase of infection. The lowest concentration of SARS-CoV-2 viral copies this  assay can detect is 250 copies / mL. A negative result does not preclude SARS-CoV-2 infection and should not be used as the sole basis for treatment or other patient management decisions.  A negative result may occur with improper specimen collection / handling, submission of specimen other than nasopharyngeal swab, presence of viral mutation(s) within the areas targeted by this assay, and inadequate number of viral copies (<250 copies / mL). A negative result must be combined with clinical observations, patient history, and epidemiological information.  Fact Sheet for Patients:   BoilerBrush.com.cyhttps://www.fda.gov/media/136312/download  Fact Sheet for Healthcare Providers: https://pope.com/https://www.fda.gov/media/136313/download  This test is not yet approved or  cleared by the [place] FDA and has been authorized for detection and/or diagnosis of SARS-CoV-2 by FDA under an Emergency Use Authorization (EUA).  This EUA will remain in effect (meaning this test can be used) for the duration of the COVID-19 declaration under Section 564(b)(1) of the Act, 21 U.S.C. section 360bbb-3(b)(1), unless the authorization is terminated or revoked sooner.  Performed at [facility], [address] [place], [place] [address]       Imaging Studies   DG Chest DresserPort 1 View  Result Date: 07/23/2020 CLINICAL DATA:  Shortness of breath.  Previous right-sided lobectomy EXAM: PORTABLE CHEST 1 VIEW COMPARISON:  June 21, 2020 FINDINGS: Postoperative volume loss on the right. There is a persistent right pleural effusion with right base consolidation. There is cardiomegaly with pulmonary venous hypertension. There is interstitial pulmonary edema, stable. No new opacity evident. No appreciable adenopathy. No bone lesions. IMPRESSION: Findings felt to be indicative of a degree of congestive heart failure. Postoperative change with volume loss on the right. Appearance similar to 2 days prior. Electronically Signed   By:  Bretta BangWilliam  Woodruff III M.D.   On: 07/23/2020 08:28     Medications   Scheduled Meds: . apixaban  5 mg Oral BID  . azithromycin  250 mg Oral Daily  . carvedilol  6.25 mg Oral BID WC  . furosemide  40 mg Intravenous Q12H  . ipratropium-albuterol  3 mL Nebulization Q4H  . iron polysaccharides  150 mg Oral Q breakfast  . losartan  25 mg Oral Daily  . methylPREDNISolone (SOLU-MEDROL) injection  40 mg Intravenous Q12H  . multivitamin  with minerals  1 tablet Oral Daily  . simvastatin  20 mg Oral QHS  . sodium chloride flush  3 mL Intravenous Q12H  . tamsulosin  0.4 mg Oral Daily  . vitamin B-12  1,000 mcg Oral Daily   Continuous Infusions: . sodium chloride         LOS: 1 day    Time spent: 30 minutes    Pennie Banter, DO [facility]  07/24/2020, 7:48 AM    If 7PM-7AM, please contact night-coverage. How to contact the [facility] Attending or Consulting provider 7A - 7P or covering provider during after hours 7P -7A, for this patient?    1. Check the care team in [facility] and look for a) attending/consulting [facility] provider listed and b) the [facility] team listed 2. Log into [email or web address] and use [facility]'s universal password to access. If you do not have the password, please contact the hospital operator. 3. Locate the [facility] provider you are looking for under [facility] and page to a number that you can be directly reached. 4. If you still have difficulty reaching the provider, please page the [facility] (Director on Call) for the Hospitalists listed on amion for assistance.

## 2020-07-25 ENCOUNTER — Encounter: Payer: Self-pay | Admitting: Internal Medicine

## 2020-07-25 DIAGNOSIS — I2510 Atherosclerotic heart disease of native coronary artery without angina pectoris: Secondary | ICD-10-CM

## 2020-07-25 DIAGNOSIS — I4819 Other persistent atrial fibrillation: Secondary | ICD-10-CM

## 2020-07-25 DIAGNOSIS — J449 Chronic obstructive pulmonary disease, unspecified: Secondary | ICD-10-CM

## 2020-07-25 DIAGNOSIS — N182 Chronic kidney disease, stage 2 (mild): Secondary | ICD-10-CM

## 2020-07-25 DIAGNOSIS — G4733 Obstructive sleep apnea (adult) (pediatric): Secondary | ICD-10-CM

## 2020-07-25 LAB — CBC
HCT: 32.8 % — ABNORMAL LOW (ref 39.0–52.0)
Hemoglobin: 9.7 g/dL — ABNORMAL LOW (ref 13.0–17.0)
MCH: 27.6 pg (ref 26.0–34.0)
MCHC: 29.6 g/dL — ABNORMAL LOW (ref 30.0–36.0)
MCV: 93.4 fL (ref 80.0–100.0)
Platelets: 159 10*3/uL (ref 150–400)
RBC: 3.51 MIL/uL — ABNORMAL LOW (ref 4.22–5.81)
RDW: 15.9 % — ABNORMAL HIGH (ref 11.5–15.5)
WBC: 11.7 10*3/uL — ABNORMAL HIGH (ref 4.0–10.5)
nRBC: 0 % (ref 0.0–0.2)

## 2020-07-25 LAB — MAGNESIUM: Magnesium: 2 mg/dL (ref 1.7–2.4)

## 2020-07-25 LAB — BASIC METABOLIC PANEL
Anion gap: 9 (ref 5–15)
BUN: 34 mg/dL — ABNORMAL HIGH (ref 8–23)
CO2: 40 mmol/L — ABNORMAL HIGH (ref 22–32)
Calcium: 9 mg/dL (ref 8.9–10.3)
Chloride: 91 mmol/L — ABNORMAL LOW (ref 98–111)
Creatinine, Ser: 1.27 mg/dL — ABNORMAL HIGH (ref 0.61–1.24)
GFR calc Af Amer: >60 mL/min (ref >60)
GFR calc non Af Amer: 56 mL/min — ABNORMAL LOW (ref >60)
Glucose, Bld: 217 mg/dL — ABNORMAL HIGH (ref 70–99)
Potassium: 4.5 mmol/L (ref 3.5–5.1)
Sodium: 140 mmol/L (ref 135–145)

## 2020-07-25 MED ORDER — PREDNISONE 20 MG PO TABS
40.0000 mg | ORAL_TABLET | Freq: Every day | ORAL | Status: AC
  Administered 2020-07-26: 40 mg via ORAL
  Filled 2020-07-25: qty 2

## 2020-07-25 NOTE — Consult Note (Signed)
WOC Nurse Consult Note: Reason for Consult:Patient known to our department from a recent (6/29) consultation by my partner, D. Sylvie Farrier. At that time, legs were edematous and red with no wounds.  Today there is a full thickness wound on the LLE measuring 1cm x 3cm x 0.2cm. Weeping of serous fluid. Wound type: venous insufficiency vs trauma vs infectious Pressure Injury POA: N/A Measurement:Per Nursing flow sheet, 1cm x 3cm x 0.2cm Wound bed:red, moist Drainage (amount, consistency, odor) serous fluid Periwound:edematous, erythematous Dressing procedure/placement/frequency: I will provided Nursing with guidance for topical care using an antimicrobial and astringent (Xeroform) gauze topped with dry dressings. This will be secured with Kerlix and ACE bandage wrapped from just below toes to just below knee for enhanced venous return via mild compression. The daily care will allow for topical care and observation of the LLE wound. Heels are to be floated to prevent pressure injury.  A pressure redistribution chair cushion is provided to reduce pressure to the sacrum and ischial tuberosities while in the chair with LEs elevated.  WOC nursing team will not follow, but will remain available to this patient, the nursing and medical teams.  Please re-consult if needed. Thanks, Ladona Mow, MSN, RN, GNP, [place]  Pager# [phone]

## 2020-07-25 NOTE — [place] FL2 (Signed)
[place] MEDICAID FL2 LEVEL OF CARE SCREENING TOOL     IDENTIFICATION  Patient Name: Victor Daniels Birthdate: 1946-11-29 Sex: male Admission Date (Current Location): 07/23/2020  [facility] and [place] Number:  Chiropodist and Address:  [facility], [address], [place], [place] [address]      Provider Number:    Attending Physician Name and Address:  Pennie Banter, DO  Relative Name and Phone Number:  Saige, Busby (Spouse) [phone]    Current Level of Care: Hospital Recommended Level of Care: Skilled Nursing Facility Prior Approval Number:    Date Approved/Denied:   PASRR Number: [id number]  Discharge Plan: SNF    Current Diagnoses: Patient Active Problem List   Diagnosis Date Noted   COPD with acute bronchitis (HCC) 07/23/2020   DVT (deep venous thrombosis) (HCC)    Acute on chronic respiratory failure with hypoxia (HCC)    Leukocytosis    Stroke (HCC)    Hematuria, gross 07/19/2020   Hospital discharge follow-up 07/04/2020   Acquired thrombophilia (HCC) 07/04/2020   Swelling    Atrial fibrillation (HCC)    Acute on chronic diastolic CHF (congestive heart failure) (HCC) 06/21/2020   CHF (congestive heart failure), NYHA class I, acute on chronic, diastolic (HCC) 06/21/2020   CKD (chronic kidney disease), stage IIIa 03/30/2020   Diabetic foot ulcer associated with type 2 diabetes mellitus (HCC) 07/23/2019   Degenerative joint disease of shoulder region 09/26/2018   Obstructive sleep apnea 11/22/2016   Hypogonadism male 07/07/2016   Obesity 06/03/2016   Reduced libido 05/11/2016   Visit for preventive health examination 03/01/2016   Diabetes mellitus with circulatory complication (HCC) 03/01/2016   Shoulder pain, left 08/03/2015   Pneumonia 04/13/2015   Marital conflict involving estrangement 04/13/2015   Cervical radiculopathy due to degenerative joint disease of spine  03/24/2015   Iron deficiency anemia 02/25/2015   Fatigue 02/25/2015   Nocturia 02/25/2015   Pernicious anemia 01/14/2015   S/P hip replacement 12/12/2014   S/p nephrectomy 09/07/2014   Postoperative state 09/07/2014   Vertigo, central 08/20/2014   Senile purpura (HCC) 06/28/2014   Incomplete emptying of bladder 10/08/2013   Anemia 09/11/2013   Chest pain with high risk for cardiac etiology 04/08/2013   COPD exacerbation (HCC) 03/05/2013   Benign localized prostatic hyperplasia with lower urinary tract symptoms (LUTS) 10/23/2012   Hematospermia 10/23/2012   Sciatica 10/23/2012   Venous insufficiency (chronic) (peripheral) 07/08/2012   Venous insufficiency    History of alcohol abuse    Vasomotor rhinitis 12/06/2011   CAD (coronary artery disease) 10/12/2011   COPD (chronic obstructive pulmonary disease) (HCC)    CHF with left ventricular diastolic dysfunction, NYHA class 2 (HCC)    Hyperlipidemia    Hypertension     Orientation RESPIRATION BLADDER Height & Weight     Self, Time, Place, Situation  O2 (3 L) Incontinent, External catheter Weight: (!) 298 lb (135.2 kg) Height:  6' (182.9 cm)  BEHAVIORAL SYMPTOMS/MOOD NEUROLOGICAL BOWEL NUTRITION STATUS      Continent    AMBULATORY STATUS COMMUNICATION OF NEEDS Skin   Extensive Assist   Other (Comment) (Left leg wound dressing)                       Personal Care Assistance Level of Assistance  Bathing, Feeding, Dressing, Total care Bathing Assistance: Limited assistance Feeding assistance: Limited assistance Dressing Assistance: Maximum assistance Total Care Assistance: Maximum assistance   Functional Limitations Info  SPECIAL CARE FACTORS FREQUENCY  PT (By licensed PT), OT (By licensed OT)     PT Frequency: 5 X Weekly OT Frequency: 5 X Weekly            Contractures Contractures Info: Not present    Additional Factors Info  Code Status Code Status Info: Full              Current Medications (07/25/2020):  This is the current hospital active medication list Current Facility-Administered Medications  Medication Dose Route Frequency Provider Last Rate Last Admin   0.9 %  sodium chloride infusion  250 mL Intravenous PRN Lorretta Harp, MD       acetaminophen (TYLENOL) tablet 650 mg  650 mg Oral Q6H PRN Lorretta Harp, MD       albuterol (PROVENTIL) (2.5 MG/3ML) 0.083% nebulizer solution 2.5 mg  2.5 mg Nebulization Q3H PRN Esaw Grandchild A, DO   2.5 mg at 07/24/20 1639   ALPRAZolam (XANAX) tablet 0.25 mg  0.25 mg Oral TID PRN Esaw Grandchild A, DO   0.25 mg at 07/24/20 1332   apixaban (ELIQUIS) tablet 5 mg  5 mg Oral BID Lorretta Harp, MD   5 mg at 07/24/20 2030   azithromycin (ZITHROMAX) tablet 250 mg  250 mg Oral Daily Lorretta Harp, MD   250 mg at 07/25/20 9211   dextromethorphan-guaiFENesin (MUCINEX DM) 30-600 MG per 12 hr tablet 1 tablet  1 tablet Oral BID PRN Lorretta Harp, MD       fluticasone furoate-vilanterol (BREO ELLIPTA) 100-25 MCG/INH 1 puff  1 puff Inhalation Daily Esaw Grandchild A, DO   1 puff at 07/25/20 0853   And   umeclidinium bromide (INCRUSE ELLIPTA) 62.5 MCG/INH 1 puff  1 puff Inhalation Daily Esaw Grandchild A, DO   1 puff at 07/25/20 0853   furosemide (LASIX) injection 40 mg  40 mg Intravenous Willette Pa, MD   40 mg at 07/25/20 0855   hydrALAZINE (APRESOLINE) injection 5 mg  5 mg Intravenous Q2H PRN Lorretta Harp, MD       ipratropium-albuterol (DUONEB) 0.5-2.5 (3) MG/3ML nebulizer solution 3 mL  3 mL Nebulization Q6H Esaw Grandchild A, DO   3 mL at 07/25/20 1316   iron polysaccharides (NIFEREX) capsule 150 mg  150 mg Oral Q breakfast Lorretta Harp, MD   150 mg at 07/25/20 9417   losartan (COZAAR) tablet 25 mg  25 mg Oral Daily Lorretta Harp, MD   25 mg at 07/25/20 4081   methylPREDNISolone sodium succinate (SOLU-MEDROL) 40 mg/mL injection 40 mg  40 mg Intravenous Willette Pa, MD   40 mg at 07/25/20 4481   multivitamin with  minerals tablet 1 tablet  1 tablet Oral Daily Lorretta Harp, MD   1 tablet at 07/25/20 8563   nitroGLYCERIN (NITROSTAT) SL tablet 0.4 mg  0.4 mg Sublingual Q5 min PRN Lorretta Harp, MD       ondansetron [facility]) injection 4 mg  4 mg Intravenous Q8H PRN Lorretta Harp, MD       oxyCODONE (Oxy IR/ROXICODONE) immediate release tablet 15 mg  15 mg Oral Q6H PRN Lorretta Harp, MD   15 mg at 07/24/20 2029   simvastatin (ZOCOR) tablet 20 mg  20 mg Oral QHS Lorretta Harp, MD   20 mg at 07/24/20 2030   sodium chloride flush (NS) 0.9 % injection 3 mL  3 mL Intravenous Q12H Lorretta Harp, MD   3 mL at 07/25/20 0853   sodium chloride flush (NS) 0.9 %  injection 3 mL  3 mL Intravenous PRN Lorretta Harp, MD       tamsulosin [facility]) capsule 0.4 mg  0.4 mg Oral Daily Lorretta Harp, MD   0.4 mg at 07/25/20 3254   vitamin B-12 (CYANOCOBALAMIN) tablet 1,000 mcg  1,000 mcg Oral Daily Lorretta Harp, MD   1,000 mcg at 07/25/20 9826     Discharge Medications: Please see discharge summary for a list of discharge medications.  Relevant Imaging Results:  Relevant Lab Results:   Additional Information SS# [id number]  Eilleen Kempf, LCSW

## 2020-07-25 NOTE — Plan of Care (Signed)
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
## 2020-07-25 NOTE — Progress Notes (Addendum)
PROGRESS NOTE    Victor Daniels   IHK:[id number]  DOB: November 23, 1946  PCP: [name], MD    DOA: 07/23/2020 LOS: 2   Brief Narrative   Victor Daniels is a 74 y.o. male with medical history significant of hypertension, hyperlipidemia COPD, on 2 L oxygen, stroke, PAD, OSA, CAD, DVT atrial fibrillation on Eliquis, dCHF, alcoholic gastritis, anemia, who presented to the ED on 07/23/20 with 4 days of progressive shortness breath, productive cough and worsening bilateral lower extremity edema.   In the ED, initial O2 sat was 63% on his home oxygen of 2 L/min.  VBG showed hypercarbia with pCO2 of 108.  Started on Bipap in the ED.  Afebrile 99.1, normotensive, tachypneic.  Labs notable for BNP 567, negative Covid-19 PCR, normal lactate 1.9, stable renal function, leukocytosis 17.1k, Hbg 11.0 (from 9.3 earlier this month).  Chest xray showed some pulmonary edema and post-op lung volume reduction changes, stable.  Admitted to hospitalist service for further management of acute on chronic respiratory failure with hypoxia and hypercapnea due to combination of decompensated CHF and acute exacerbation of COPD.      Assessment & Plan   Principal Problem:   Acute on chronic respiratory failure with hypoxia (HCC) Active Problems:   Hyperlipidemia   Hypertension   CAD (coronary artery disease)   Iron deficiency anemia   CKD (chronic kidney disease), stage IIIa   Acute on chronic diastolic CHF (congestive heart failure) (HCC)   Atrial fibrillation (HCC)   DVT (deep venous thrombosis) (HCC)   Stroke (HCC)   COPD with acute bronchitis (HCC)   Acute on chronic respiratory failure with hypoxia and hypercarbia - secondary to decompensated CHF and acute exacerbation of COPD.  Presented with expiratory wheezing consistent with COPD in addition to 3+ pitting lower extremity edema and elevated BNP, pulmonary edema on chest x-ray consistent with CHF.  Continue supplemental oxygen to  maintain O2 sat greater than 90%.  Acute on chronic diastolic CHF - recent echo 06/22/2020 showed EF greater than 55%.  Follows with Dr. Mariah Milling.  Cardiology consulted.  Continue IV Lasix 40 mg twice daily.  Daily weights and strict I/O's.  Low-sodium diet with fluid restriction. Net fluid balance -2353 since admission.  Remains markedly volume overloaded.  Acute exacerbation of COPD /acute bronchitis -present on admission.  Much improved today with no wheezing on exam but had just received a breathing treatment.  Hypercarbic with PCO2 of 108 on admission, placed on BiPAP in the ED.  Continue IV Solu-Medrol this AM now d/c'd. Will give Prednisone tomorrow for 3 days total.  Z-Pak.  Mucinex.  Scheduled DuoNebs with as needed albuterol nebs.  Incentive spirometry.  Follow-up on blood and sputum cultures.  BiPAP as needed.  Persistent A. Fib -rate on the low side, Coreg discontinued by cardiology.  Continue Eliquis.  Cardioversion is being discussed.  Hypertension -continue Coreg and Cozaar.  Hydralazine as needed.  Hyperlipidemia -continue Zocor  Coronary artery disease -stable, no active chest pain.  Continue statin and as needed SL nitro.  Iron deficiency anemia -chronic and stable on admission.  Continue iron supplement.  CBCs to monitor.  Solitary kidney CKD stage IIIa -renal function stable, monitor closely while diuresing.  Daily BMP.  Atrial fibrillation -rate controlled.  Continue Coreg and Eliquis.  DVT -continue Eliquis  History of stroke -continue Zocor  Sleep apnea -not on CPAP, contributes to respiratory issues, A. fib and complicates overall management.  Highly recommend compliance with CPAP.  Patient BMI: Body mass index is 40.42 kg/m.   DVT prophylaxis:  apixaban (ELIQUIS) tablet 5 mg   Diet:  Diet Orders (From admission, onward)    Start     Ordered   07/23/20 1735  Diet Heart Room service appropriate? Yes; Fluid consistency: Thin; Fluid restriction: 1500 mL Fluid   Diet effective now       Question Answer Comment  Room service appropriate? Yes   Fluid consistency: Thin   Fluid restriction: 1500 mL Fluid      07/23/20 1734            Code Status: Full Code    Subjective 07/25/20    Patient seen at bedside this morning.  No acute events reported overnight.  His nurse had been reporting to me that he wanted to be discharged today.  When I saw the patient, he asked what were doing for him, and was agreeable to stay once he understood the plan.  Says his breathing is much better but he just feels bad in general.  Denies shortness of breath with physical therapy but did drop his oxygen saturation significantly.  He denies fevers or chills, nausea vomiting, chest pain or other complaints at this time.   Disposition Plan & Communication   Status is: Inpatient  Remains inpatient appropriate because:IV treatments appropriate due to intensity of illness or inability to take PO   Dispo: The patient is from: Home              Anticipated d/c is to: Home              Anticipated d/c date is: 3 days              Patient currently is not medically stable to d/c.        Family Communication: None at bedside, will attempt to call   Consults, Procedures, Significant Events   Consultants:   Cardiology  Procedures:   None  Antimicrobials:   None   Objective   Vitals:   07/25/20 0118 07/25/20 0333 07/25/20 0651 07/25/20 0721  BP:  125/79 (!) 138/95 (!) 149/89  Pulse: 57 65 63 70  Resp:  15  17  Temp:  98.8 F (37.1 C) 98 F (36.7 C) 97.6 F (36.4 C)  TempSrc:  Oral Oral Oral  SpO2: 94% 94% 100% 99%  Weight:  (!) 135.2 kg    Height:        Intake/Output Summary (Last 24 hours) at 07/25/2020 0746 Last data filed at 07/25/2020 [id number] Gross per 24 hour  Intake 237 ml  Output 2400 ml  Net -2163 ml   Filed Weights   07/23/20 1857 07/24/20 0408 07/25/20 0333  Weight: (!) 136.8 kg (!) 136.2 kg (!) 135.2 kg    Physical  Exam:  General exam: awake, alert, no acute distress, obese HEENT: moist mucus membranes, hearing grossly normal  Respiratory system: Bilateral crackles, no wheezing or rhonchi, normal respiratory effort on 3 L/min nasal cannula oxygen. Cardiovascular system: normal S1/S2, RRR, tense pitting edema of bilateral lower extremities.   Gastrointestinal system: soft, NT, ND Central nervous system: A&O x4. no gross focal neurologic deficits, normal speech Extremities: moves all, tense pitting edema venous stasis lower extremities Psychiatry: normal mood, congruent affect  Labs   Data Reviewed: I have personally reviewed following labs and imaging studies  CBC: Recent Labs  Lab 07/23/20 0749 07/24/20 0343 07/25/20 0418  WBC 17.1* 9.0 11.7*  HGB 11.0* 9.4* 9.7*  HCT 38.2* 31.5* 32.8*  MCV 96.2 92.9 93.4  PLT 205 146* 159   Basic Metabolic Panel: Recent Labs  Lab 07/23/20 0749 07/24/20 0343 07/25/20 0418  NA 141 141 140  K 3.8 4.1 4.5  CL 94* 91* 91*  CO2 39* 37* 40*  GLUCOSE 161* 150* 217*  BUN 16 24* 34*  CREATININE 1.28* 1.24 1.27*  CALCIUM 9.0 8.9 9.0  MG 1.9 2.0 2.0   GFR: Estimated Creatinine Clearance: 73.7 mL/min (A) (by C-G formula based on SCr of 1.27 mg/dL (H)). Liver Function Tests: Recent Labs  Lab 07/23/20 0749  AST 16  ALT 12  ALKPHOS 86  BILITOT 1.5*  PROT 7.8  ALBUMIN 3.3*   No results for input(s): LIPASE, AMYLASE in the last 168 hours. No results for input(s): AMMONIA in the last 168 hours. Coagulation Profile: Recent Labs  Lab 07/23/20 0749  INR 1.3*   Cardiac Enzymes: No results for input(s): CKTOTAL, CKMB, CKMBINDEX, TROPONINI in the last 168 hours. BNP (last 3 results) No results for input(s): PROBNP in the last 8760 hours. HbA1C: No results for input(s): HGBA1C in the last 72 hours. CBG: No results for input(s): GLUCAP in the last 168 hours. Lipid Profile: No results for input(s): CHOL, HDL, LDLCALC, TRIG, CHOLHDL, LDLDIRECT in the  last 72 hours. Thyroid Function Tests: No results for input(s): TSH, T4TOTAL, FREET4, T3FREE, THYROIDAB in the last 72 hours. Anemia Panel: No results for input(s): VITAMINB12, FOLATE, FERRITIN, TIBC, IRON, RETICCTPCT in the last 72 hours. Sepsis Labs: Recent Labs  Lab 07/23/20 0749  LATICACIDVEN 1.9    Recent Results (from the past 240 hour(s))  SARS Coronavirus 2 by RT PCR (hospital order, performed in [facility] hospital lab) Nasopharyngeal Nasopharyngeal Swab     Status: None   Collection Time: 07/23/20  7:40 AM   Specimen: Nasopharyngeal Swab  Result Value Ref Range Status   SARS Coronavirus 2 NEGATIVE NEGATIVE Final    Comment: (NOTE) SARS-CoV-2 target nucleic acids are NOT DETECTED.  The SARS-CoV-2 RNA is generally detectable in upper and lower respiratory specimens during the acute phase of infection. The lowest concentration of SARS-CoV-2 viral copies this assay can detect is 250 copies / mL. A negative result does not preclude SARS-CoV-2 infection and should not be used as the sole basis for treatment or other patient management decisions.  A negative result may occur with improper specimen collection / handling, submission of specimen other than nasopharyngeal swab, presence of viral mutation(s) within the areas targeted by this assay, and inadequate number of viral copies (<250 copies / mL). A negative result must be combined with clinical observations, patient history, and epidemiological information.  Fact Sheet for Patients:   BoilerBrush.com.cy  Fact Sheet for Healthcare Providers: https://pope.com/  This test is not yet approved or  cleared by the [place] FDA and has been authorized for detection and/or diagnosis of SARS-CoV-2 by FDA under an Emergency Use Authorization (EUA).  This EUA will remain in effect (meaning this test can be used) for the duration of the COVID-19 declaration under Section  564(b)(1) of the Act, 21 U.S.C. section 360bbb-3(b)(1), unless the authorization is terminated or revoked sooner.  Performed at [facility], [address] [place], [place] [address]   Blood culture (routine single)     Status: None (Preliminary result)   Collection Time: 07/23/20  7:49 AM   Specimen: BLOOD  Result Value Ref Range Status   Specimen Description BLOOD LEFT ANTECUBITAL  Final   Special  Requests   Final    BOTTLES DRAWN AEROBIC AND ANAEROBIC Blood Culture adequate volume   Culture   Final    NO GROWTH 1 DAY Performed at [facility], [address], [place], [place] [address]    Report Status PENDING  Incomplete      Imaging Studies   DG Chest Port 1 View  Result Date: 07/23/2020 CLINICAL DATA:  Shortness of breath.  Previous right-sided lobectomy EXAM: PORTABLE CHEST 1 VIEW COMPARISON:  June 21, 2020 FINDINGS: Postoperative volume loss on the right. There is a persistent right pleural effusion with right base consolidation. There is cardiomegaly with pulmonary venous hypertension. There is interstitial pulmonary edema, stable. No new opacity evident. No appreciable adenopathy. No bone lesions. IMPRESSION: Findings felt to be indicative of a degree of congestive heart failure. Postoperative change with volume loss on the right. Appearance similar to 2 days prior. Electronically Signed   By: Bretta Bang III M.D.   On: 07/23/2020 08:28     Medications   Scheduled Meds: . apixaban  5 mg Oral BID  . azithromycin  250 mg Oral Daily  . carvedilol  6.25 mg Oral BID WC  . fluticasone furoate-vilanterol  1 puff Inhalation Daily   And  . umeclidinium bromide  1 puff Inhalation Daily  . furosemide  40 mg Intravenous Q12H  . ipratropium-albuterol  3 mL Nebulization Q6H  . iron polysaccharides  150 mg Oral Q breakfast  . losartan  25 mg Oral Daily  . methylPREDNISolone (SOLU-MEDROL) injection  40 mg Intravenous Q12H  . multivitamin with minerals   1 tablet Oral Daily  . simvastatin  20 mg Oral QHS  . sodium chloride flush  3 mL Intravenous Q12H  . tamsulosin  0.4 mg Oral Daily  . vitamin B-12  1,000 mcg Oral Daily   Continuous Infusions: . sodium chloride         LOS: 2 days    Time spent: 30 minutes    Pennie Banter, DO [facility]  07/25/2020, 7:46 AM    If 7PM-7AM, please contact night-coverage. How to contact the [facility] Attending or Consulting provider 7A - 7P or covering provider during after hours 7P -7A, for this patient?    1. Check the care team in [facility] and look for a) attending/consulting [facility] provider listed and b) the [facility] team listed 2. Log into [email or web address] and use [place]'s universal password to access. If you do not have the password, please contact the hospital operator. 3. Locate the [facility] provider you are looking for under [facility] and page to a number that you can be directly reached. 4. If you still have difficulty reaching the provider, please page the [facility] (Director on Call) for the Hospitalists listed on amion for assistance.

## 2020-07-25 NOTE — Progress Notes (Signed)
Physical Therapy Evaluation Patient Details Name: TAKASHI KOROL MRN: [id number] DOB: 04-Oct-1946 Today's Date: 07/25/2020   History of Present Illness  Per MD Note:Heraclio R Italiano is a 74 y.o. male with medical history significant of hypertension, hyperlipidemia COPD, on 2 L oxygen, stroke, PAD, OSA, CAD, DVT atrial fibrillation on Eliquis, dCHF, alcoholic gastritis, anemia, who presents with shortness breath.  Clinical Impression  Patient agrees to PT evaluation. Pt reports no pain. Pt lives alone with a level entry into his home. Pt ambulated with RW  prior to this hospital admission. Pt has 3/5 strength BLE hip and knee and is min assist for bed mobility, min assist for transfers sit to stand with RW. Pt has WNL static sitting balance and fair static/dynamic standing balance and needs UE support with RW. Pt will continue to benefit from skilled PT to improve mobility and strength.    Follow Up Recommendations SNF    Equipment Recommendations  Rolling walker with 5" wheels    Recommendations for Other Services       Precautions / Restrictions Restrictions Weight Bearing Restrictions: No      Mobility  Bed Mobility Overal bed mobility: Modified Independent                Transfers Overall transfer level: Modified independent Equipment used: Rolling walker (2 wheeled)             General transfer comment: raised up the bed  (O2 saturation dropped to 85% in standing with RW)  Ambulation/Gait Ambulation/Gait assistance:  (O2 saturation dropped to 85%)              Stairs            Wheelchair Mobility    Modified Rankin (Stroke Patients Only)       Balance Overall balance assessment: Needs assistance Sitting-balance support: Bilateral upper extremity supported;Feet supported Sitting balance-Leahy Scale: Good     Standing balance support: Bilateral upper extremity supported Standing balance-Leahy Scale: Fair                                Pertinent Vitals/Pain Pain Assessment: No/denies pain    Home Living Family/patient expects to be discharged to:: Private residence Living Arrangements: Spouse/significant other Available Help at Discharge: Family Type of Home: House Home Access: Level entry   Entrance Stairs-Number of Steps: 0 Home Layout: One level Home Equipment: Environmental consultant - 2 wheels      Prior Function Level of Independence: Independent with assistive device(s)   Gait / Transfers Assistance Needed: ambulated with RW household distances           Hand Dominance        Extremity/Trunk Assessment   Upper Extremity Assessment Upper Extremity Assessment: Overall WFL for tasks assessed    Lower Extremity Assessment Lower Extremity Assessment: RLE deficits/detail;LLE deficits/detail RLE Deficits / Details: -3/5 hip flex, 3/5 knee extension LLE Deficits / Details: -3/5 hip flex, 3/5 knee extension       Communication   Communication: No difficulties  Cognition Arousal/Alertness: Awake/alert Behavior During Therapy: WFL for tasks assessed/performed Overall Cognitive Status: Within Functional Limits for tasks assessed                                        General Comments      Exercises  Assessment/Plan    PT Assessment Patient needs continued PT services  PT Problem List         PT Treatment Interventions Gait training;Therapeutic activities;Therapeutic exercise;Balance training    PT Goals (Current goals can be found in the Care Plan section)  Acute Rehab PT Goals Patient Stated Goal: to go home PT Goal Formulation: Patient unable to participate in goal setting Time For Goal Achievement: 08/08/20 Potential to Achieve Goals: Fair    Frequency Min 2X/week   Barriers to discharge        Co-evaluation               AM-PAC PT "6 Clicks" Mobility  Outcome Measure Help needed turning from your back to your side while in a flat bed  without using bedrails?: A Little Help needed moving from lying on your back to sitting on the side of a flat bed without using bedrails?: A Little Help needed moving to and from a bed to a chair (including a wheelchair)?: A Lot Help needed standing up from a chair using your arms (e.g., wheelchair or bedside chair)?: A Lot Help needed to walk in hospital room?: Total Help needed climbing 3-5 steps with a railing? : Total 6 Click Score: 12    End of Session Equipment Utilized During Treatment: Gait belt Activity Tolerance: Patient tolerated treatment well Patient left: in bed;with call bell/phone within reach Nurse Communication: Mobility status PT Visit Diagnosis: Unsteadiness on feet (R26.81);Muscle weakness (generalized) (M62.81);Difficulty in walking, not elsewhere classified (R26.2);Pain    Time: 1002-1030 PT Time Calculation (min) (ACUTE ONLY): 28 min   Charges:   PT Evaluation $PT Eval Low Complexity: 1 Low PT Treatments $Therapeutic Activity: 23-37 mins         Ezekiel Ina, PT DPT 07/25/2020, 12:28 PM

## 2020-07-25 NOTE — Progress Notes (Signed)
CCMD called and reported that pt just have a 2.31 paused. Pt was asymptomatic on assessment. VSS. Notify NP Jon Billings. Will continue to monitor.

## 2020-07-25 NOTE — TOC Initial Note (Signed)
Transition of Care [facility]) - Initial/Assessment Note    Patient Details  Name: Victor Daniels MRN: [id number] Date of Birth: 1946-04-19  [facility]) CM/SW Contact:    Meriel Flavors, LCSW Phone Number: 07/25/2020, 2:29 PM  Clinical Narrative:                 Victor Mule Kirchgessneris a 74 y.o.malewith medical history significant ofhypertension, hyperlipidemia COPD, on 2 L oxygen, stroke, PAD, OSA, CAD, DVT atrial fibrillation on Eliquis,dCHF, alcoholic gastritis, anemia, who presented to the ED on 07/23/20 with 4 days of progressive shortness breath, productive cough and worsening bilateral lower extremity edema.  CSW met with patient and wife at bedside to discuss goals for this hospitalization and plans for safe discharge. CSW explained recommendations are for SNF placement with rehab, encouraged patient to consider SNF preferences. [facility] and wife agreed and [facility] [place] confirmed [facility] [place] as their choice, explained the process with insurance auth.       Patient Goals and CMS Choice        Expected Discharge Plan and Services                                                Prior Living Arrangements/Services                       Activities of Daily Living Home Assistive Devices/Equipment: Environmental consultant (specify type), Cane (specify quad or straight) (2 prong cane: two wheels on front walker) ADL Screening (condition at time of admission) Patient's cognitive ability adequate to safely complete daily activities?: Yes Is the patient deaf or have difficulty hearing?: No Does the patient have difficulty seeing, even when wearing glasses/contacts?: No Does the patient have difficulty concentrating, remembering, or making decisions?: No Patient able to express need for assistance with ADLs?: Yes Does the patient have difficulty dressing or bathing?: Yes (wife helps) Independently performs ADLs?: No (wifes helps  him) Communication: Independent Dressing (OT): Needs assistance Is this a change from baseline?: Pre-admission baseline Grooming: Independent Feeding: Independent Bathing: Needs assistance Is this a change from baseline?: Pre-admission baseline Toileting: Needs assistance Is this a change from baseline?: Pre-admission baseline In/Out Bed: Needs assistance Is this a change from baseline?: Pre-admission baseline Weakness of Legs: Both Weakness of Arms/Hands: Both  Permission Sought/Granted                  Emotional Assessment              Admission diagnosis:  Dyspnea [R06.00] SOB (shortness of breath) [R06.02] Hypoxia [R09.02] Acute on chronic diastolic CHF (congestive heart failure) (HCC) [I50.33] Acute on chronic respiratory failure with hypoxia ([place]) [J96.21] Acute on chronic congestive heart failure, unspecified heart failure type ([place]) [I50.9] Patient Active Problem List   Diagnosis Date Noted  . COPD with acute bronchitis ([place]) 07/23/2020  . DVT (deep venous thrombosis) ([place])   . Acute on chronic respiratory failure with hypoxia ([place])   . Leukocytosis   . Stroke ([place])   . Hematuria, gross 07/19/2020  . Hospital discharge follow-up 07/04/2020  . Acquired thrombophilia ([place]) 07/04/2020  . Swelling   . Atrial fibrillation ([place])   . Acute on chronic diastolic CHF (congestive heart failure) ([place]) 06/21/2020  . CHF (congestive heart failure), NYHA class I, acute on chronic, diastolic (  [place]) 06/21/2020  . CKD (chronic kidney disease), stage IIIa 03/30/2020  . Diabetic foot ulcer associated with type 2 diabetes mellitus ([place]) 07/23/2019  . Degenerative joint disease of shoulder region 09/26/2018  . Obstructive sleep apnea 11/22/2016  . Hypogonadism male 07/07/2016  . Obesity 06/03/2016  . Reduced libido 05/11/2016  . Visit for preventive health examination 03/01/2016  . Diabetes mellitus with circulatory complication ([place]) 45/36/4680  . Shoulder pain, left  08/03/2015  . Pneumonia 04/13/2015  . Marital conflict involving estrangement 04/13/2015  . Cervical radiculopathy due to degenerative joint disease of spine 03/24/2015  . Iron deficiency anemia 02/25/2015  . Fatigue 02/25/2015  . Nocturia 02/25/2015  . Pernicious anemia 01/14/2015  . S/P hip replacement 12/12/2014  . S/p nephrectomy 09/07/2014  . Postoperative state 09/07/2014  . Vertigo, central 08/20/2014  . Senile purpura ([place]) 06/28/2014  . Incomplete emptying of bladder 10/08/2013  . Anemia 09/11/2013  . Chest pain with high risk for cardiac etiology 04/08/2013  . COPD exacerbation ([place]) 03/05/2013  . Benign localized prostatic hyperplasia with lower urinary tract symptoms (LUTS) 10/23/2012  . Hematospermia 10/23/2012  . Sciatica 10/23/2012  . Venous insufficiency (chronic) (peripheral) 07/08/2012  . Venous insufficiency   . History of alcohol abuse   . Vasomotor rhinitis 12/06/2011  . CAD (coronary artery disease) 10/12/2011  . COPD (chronic obstructive pulmonary disease) ([place])   . CHF with left ventricular diastolic dysfunction, NYHA class 2 ([place])   . Hyperlipidemia   . Hypertension    PCP:  Crecencio Mc, MD Pharmacy:   [facility] [address]), [place] [address] Phone: [phone] Fax: [phone]  [facility] ([place]) [place], [place] [address] Phone: [phone] Fax: [phone]  [facility] [place], [place] [address] Phone: [phone] Fax: [phone]     Social Determinants of Health (SDOH) Interventions    Readmission Risk Interventions No flowsheet data found.

## 2020-07-26 LAB — BASIC METABOLIC PANEL
Anion gap: 8 (ref 5–15)
BUN: 40 mg/dL — ABNORMAL HIGH (ref 8–23)
CO2: 44 mmol/L — ABNORMAL HIGH (ref 22–32)
Calcium: 8.9 mg/dL (ref 8.9–10.3)
Chloride: 89 mmol/L — ABNORMAL LOW (ref 98–111)
Creatinine, Ser: 1.19 mg/dL (ref 0.61–1.24)
GFR calc Af Amer: >60 mL/min (ref >60)
GFR calc non Af Amer: >60 mL/min (ref >60)
Glucose, Bld: 161 mg/dL — ABNORMAL HIGH (ref 70–99)
Potassium: 4 mmol/L (ref 3.5–5.1)
Sodium: 141 mmol/L (ref 135–145)

## 2020-07-26 MED ORDER — FLUTICASONE-UMECLIDIN-VILANT 100-62.5-25 MCG/INH IN AEPB: 1.0000 | INHALATION_SPRAY | Freq: Every day | RESPIRATORY_TRACT | Status: DC

## 2020-07-26 NOTE — Care Management Important Message (Signed)
Important Message  Patient Details  Name: Victor Daniels MRN: [id number] Date of Birth: 10-Aug-1946   Medicare Important Message Given:  Yes     Olegario Messier A Hajer Dwyer 07/26/2020, 1:31 PM

## 2020-07-26 NOTE — Plan of Care (Signed)
  Problem: Clinical Measurements: Goal: Respiratory complications will improve Outcome: Progressing   Problem: Activity: Goal: Risk for activity intolerance will decrease Outcome: Progressing   Problem: Pain Managment: Goal: General experience of comfort will improve Outcome: Progressing   Problem: Safety: Goal: Ability to remain free from injury will improve Outcome: Progressing   

## 2020-07-26 NOTE — Progress Notes (Signed)
PROGRESS NOTE    Victor Daniels   DGL:[id number]  DOB: [birth year]  PCP: [name], MD    DOA: 07/23/2020 LOS: 3   Brief Narrative   Victor Daniels is a 74 y.o. male with medical history significant of hypertension, hyperlipidemia COPD, on 2 L oxygen, stroke, PAD, OSA, CAD, DVT atrial fibrillation on Eliquis, dCHF, alcoholic gastritis, anemia, who presented to the ED on 07/23/20 with 4 days of progressive shortness breath, productive cough and worsening bilateral lower extremity edema.   In the ED, initial O2 sat was 63% on his home oxygen of 2 L/min.  VBG showed hypercarbia with pCO2 of 108.  Started on Bipap in the ED.  Afebrile 99.1, normotensive, tachypneic.  Labs notable for BNP 567, negative Covid-19 PCR, normal lactate 1.9, stable renal function, leukocytosis 17.1k, Hbg 11.0 (from 9.3 earlier this month).  Chest xray showed some pulmonary edema and post-op lung volume reduction changes, stable.  Admitted to hospitalist service for further management of acute on chronic respiratory failure with hypoxia and hypercapnea due to combination of decompensated CHF and acute exacerbation of COPD.      Assessment & Plan   Principal Problem:   Acute on chronic respiratory failure with hypoxia (HCC) Active Problems:   Hyperlipidemia   Hypertension   CAD (coronary artery disease)   Iron deficiency anemia   CKD (chronic kidney disease), stage IIIa   Acute on chronic diastolic CHF (congestive heart failure) (HCC)   Atrial fibrillation (HCC)   DVT (deep venous thrombosis) (HCC)   Stroke (HCC)   COPD with acute bronchitis (HCC)   Acute on chronic respiratory failure with hypoxia and hypercarbia - secondary to decompensated CHF and acute exacerbation of COPD.  Presented with expiratory wheezing consistent with COPD in addition to 3+ pitting lower extremity edema and elevated BNP, pulmonary edema on chest x-ray consistent with CHF.  Continue supplemental oxygen to  maintain O2 sat greater than 90%.  Baseline O2 requirement 2-3 L/min.  Acute on chronic diastolic CHF - recent echo 06/22/2020 showed EF greater than 55%.  Follows with Dr. Mariah Milling.  Cardiology consulted.  Continue IV Lasix 40 mg twice daily.  Daily weights and strict I/O's.  Low-sodium diet with fluid restriction. Net fluid balance -6683 since admission.  Remains volume overloaded, continue IV diuresis.  Acute exacerbation of COPD /acute bronchitis -present on admission.  Much improved today with no wheezing on exam but had just received a breathing treatment.  Hypercarbic with PCO2 of 108 on admission, placed on BiPAP in the ED.  Completed 3 days steroids.  Markedly improved without wheezing on exam.  Finish up Z-Pak.  Continue Mucinex.   Scheduled DuoNebs with as needed albuterol nebs.  Incentive spirometry.  Follow-up on blood and sputum cultures.  BiPAP as needed.  Resume home Trelegy.  Persistent A. Fib -rate on the low side, Coreg discontinued by cardiology.  Continue Eliquis.  Cardioversion is being discussed.  Hypertension -continue Coreg and Cozaar.  Hydralazine as needed.  Hyperlipidemia -continue Zocor  Coronary artery disease -stable, no active chest pain.  Continue statin and as needed SL nitro.  Iron deficiency anemia -chronic and stable on admission.  Continue iron supplement.  CBCs to monitor.  Solitary kidney CKD stage IIIa -renal function stable, monitor closely while diuresing.  Daily BMP.  Atrial fibrillation -rate controlled.  Continue Coreg and Eliquis.  DVT -continue Eliquis  History of stroke -continue Zocor  Sleep apnea -not on CPAP, contributes to respiratory issues, A. fib  and complicates overall management.  Highly recommend compliance with CPAP.   Patient BMI: Body mass index is 40.33 kg/m.   DVT prophylaxis:  apixaban (ELIQUIS) tablet 5 mg   Diet:  Diet Orders (From admission, onward)    Start     Ordered   07/23/20 1735  Diet Heart Room service  appropriate? Yes; Fluid consistency: Thin; Fluid restriction: 1500 mL Fluid  Diet effective now       Question Answer Comment  Room service appropriate? Yes   Fluid consistency: Thin   Fluid restriction: 1500 mL Fluid      07/23/20 1734            Code Status: Full Code    Subjective 07/26/20    Patient seen at bedside this morning.  No acute events reported overnight.  Says he is feeling much better, denies wheezing.  Says leg swelling is improving.  Denies fevers or chills, chest pain or shortness of breath at rest.   Disposition Plan & Communication   Status is: Inpatient  Remains inpatient appropriate because:IV treatments appropriate due to intensity of illness or inability to take PO   Dispo: The patient is from: Home              Anticipated d/c is to: Home              Anticipated d/c date is: 3 days              Patient currently is not medically stable to d/c.        Family Communication: None at bedside, will attempt to call   Consults, Procedures, Significant Events   Consultants:   Cardiology  Procedures:   None  Antimicrobials:   None   Objective   Vitals:   07/25/20 1946 07/26/20 0432 07/26/20 0727 07/26/20 0750  BP:  117/72  (!) 130/73  Pulse:  74 72 52  Resp:  18 18 17   Temp:  97.9 F (36.6 C)  97.9 F (36.6 C)  TempSrc:  Oral  Oral  SpO2: 96% 98% 96% 98%  Weight:  (!) 134.9 kg    Height:        Intake/Output Summary (Last 24 hours) at 07/26/2020 0801 Last data filed at 07/26/2020 0500 Gross per 24 hour  Intake 720 ml  Output 3400 ml  Net -2680 ml   Filed Weights   07/24/20 0408 07/25/20 0333 07/26/20 0432  Weight: (!) 136.2 kg (!) 135.2 kg (!) 134.9 kg    Physical Exam:  General exam: awake, alert, no acute distress, obese HEENT: moist mucus membranes, hearing grossly normal  Respiratory system: Faint crackles, no wheezing or rhonchi, normal respiratory effort on 3 L/min nasal cannula oxygen. Cardiovascular system:  normal S1/S2, RRR, tense pitting edema of bilateral lower extremities.   Central nervous system: A&O x4. no gross focal neurologic deficits, normal speech Extremities: moves all, improved pitting edema BLE's, venous stasis dermatitis Psychiatry: normal mood, congruent affect  Labs   Data Reviewed: I have personally reviewed following labs and imaging studies  CBC: Recent Labs  Lab 07/23/20 0749 07/24/20 0343 07/25/20 0418  WBC 17.1* 9.0 11.7*  HGB 11.0* 9.4* 9.7*  HCT 38.2* 31.5* 32.8*  MCV 96.2 92.9 93.4  PLT 205 146* 159   Basic Metabolic Panel: Recent Labs  Lab 07/23/20 0749 07/24/20 0343 07/25/20 0418 07/26/20 0423  NA 141 141 140 141  K 3.8 4.1 4.5 4.0  CL 94* 91* 91* 89*  CO2 39*  37* 40* 44*  GLUCOSE 161* 150* 217* 161*  BUN 16 24* 34* 40*  CREATININE 1.28* 1.24 1.27* 1.19  CALCIUM 9.0 8.9 9.0 8.9  MG 1.9 2.0 2.0  --    GFR: Estimated Creatinine Clearance: 78.6 mL/min (by C-G formula based on SCr of 1.19 mg/dL). Liver Function Tests: Recent Labs  Lab 07/23/20 0749  AST 16  ALT 12  ALKPHOS 86  BILITOT 1.5*  PROT 7.8  ALBUMIN 3.3*   No results for input(s): LIPASE, AMYLASE in the last 168 hours. No results for input(s): AMMONIA in the last 168 hours. Coagulation Profile: Recent Labs  Lab 07/23/20 0749  INR 1.3*   Cardiac Enzymes: No results for input(s): CKTOTAL, CKMB, CKMBINDEX, TROPONINI in the last 168 hours. BNP (last 3 results) No results for input(s): PROBNP in the last 8760 hours. HbA1C: No results for input(s): HGBA1C in the last 72 hours. CBG: No results for input(s): GLUCAP in the last 168 hours. Lipid Profile: No results for input(s): CHOL, HDL, LDLCALC, TRIG, CHOLHDL, LDLDIRECT in the last 72 hours. Thyroid Function Tests: No results for input(s): TSH, T4TOTAL, FREET4, T3FREE, THYROIDAB in the last 72 hours. Anemia Panel: No results for input(s): VITAMINB12, FOLATE, FERRITIN, TIBC, IRON, RETICCTPCT in the last 72 hours. Sepsis  Labs: Recent Labs  Lab 07/23/20 0749  LATICACIDVEN 1.9    Recent Results (from the past 240 hour(s))  SARS Coronavirus 2 by RT PCR (hospital order, performed in [facility] hospital lab) Nasopharyngeal Nasopharyngeal Swab     Status: None   Collection Time: 07/23/20  7:40 AM   Specimen: Nasopharyngeal Swab  Result Value Ref Range Status   SARS Coronavirus 2 NEGATIVE NEGATIVE Final    Comment: (NOTE) SARS-CoV-2 target nucleic acids are NOT DETECTED.  The SARS-CoV-2 RNA is generally detectable in upper and lower respiratory specimens during the acute phase of infection. The lowest concentration of SARS-CoV-2 viral copies this assay can detect is 250 copies / mL. A negative result does not preclude SARS-CoV-2 infection and should not be used as the sole basis for treatment or other patient management decisions.  A negative result may occur with improper specimen collection / handling, submission of specimen other than nasopharyngeal swab, presence of viral mutation(s) within the areas targeted by this assay, and inadequate number of viral copies (<250 copies / mL). A negative result must be combined with clinical observations, patient history, and epidemiological information.  Fact Sheet for Patients:   BoilerBrush.com.cy  Fact Sheet for Healthcare Providers: https://pope.com/  This test is not yet approved or  cleared by the [place] FDA and has been authorized for detection and/or diagnosis of SARS-CoV-2 by FDA under an Emergency Use Authorization (EUA).  This EUA will remain in effect (meaning this test can be used) for the duration of the COVID-19 declaration under Section 564(b)(1) of the Act, 21 U.S.C. section 360bbb-3(b)(1), unless the authorization is terminated or revoked sooner.  Performed at [facility], [address] [place], [place] [address]   Blood culture (routine single)     Status: None  (Preliminary result)   Collection Time: 07/23/20  7:49 AM   Specimen: BLOOD  Result Value Ref Range Status   Specimen Description BLOOD LEFT ANTECUBITAL  Final   Special Requests   Final    BOTTLES DRAWN AEROBIC AND ANAEROBIC Blood Culture adequate volume   Culture   Final    NO GROWTH 3 DAYS Performed at [facility], [address] [place], [place] [address]  Report Status PENDING  Incomplete      Imaging Studies   No results found.   Medications   Scheduled Meds: . apixaban  5 mg Oral BID  . azithromycin  250 mg Oral Daily  . fluticasone furoate-vilanterol  1 puff Inhalation Daily   And  . umeclidinium bromide  1 puff Inhalation Daily  . furosemide  40 mg Intravenous Q12H  . ipratropium-albuterol  3 mL Nebulization Q6H  . iron polysaccharides  150 mg Oral Q breakfast  . losartan  25 mg Oral Daily  . multivitamin with minerals  1 tablet Oral Daily  . predniSONE  40 mg Oral Q breakfast  . simvastatin  20 mg Oral QHS  . sodium chloride flush  3 mL Intravenous Q12H  . tamsulosin  0.4 mg Oral Daily  . vitamin B-12  1,000 mcg Oral Daily   Continuous Infusions: . sodium chloride         LOS: 3 days    Time spent: 30 minutes    Pennie BanterKelly A Celese Banner, DO [facility]  07/26/2020, 8:01 AM    If 7PM-7AM, please contact night-coverage. How to contact the [facility] Attending or Consulting provider 7A - 7P or covering provider during after hours 7P -7A, for this patient?    1. Check the care team in [facility] and look for a) attending/consulting TR[facility] provider listed and b) the [facility] team listed 2. Log into [email or web address] and use [place] universal password to access. If you do not have the password, please contact the hospital operator. 3. Locate the [facility] provider you are looking for under [facility] and page to a number that you can be directly reached. 4. If you still have difficulty reaching the provider, please page the [facility] (Director on Call) for the  Hospitalists listed on amion for assistance.

## 2020-07-26 NOTE — Progress Notes (Signed)
Progress Note  Patient Name: Victor Daniels Date of Encounter: 07/26/2020  Primary Cardiologist:Dr. Mariah Milling  Subjective   Remains SOB.  Does not feel his Afib though did note feeling SOB prior to admission.  No current CP, racing HR, palpitations, presyncope, syncope. States today that he will start eating healthier. He only drinks a rare beer but reports previous abuse of EtOH.  Inpatient Medications    Scheduled Meds: . apixaban  5 mg Oral BID  . azithromycin  250 mg Oral Daily  . fluticasone furoate-vilanterol  1 puff Inhalation Daily   And  . umeclidinium bromide  1 puff Inhalation Daily  . furosemide  40 mg Intravenous Q12H  . ipratropium-albuterol  3 mL Nebulization Q6H  . iron polysaccharides  150 mg Oral Q breakfast  . losartan  25 mg Oral Daily  . multivitamin with minerals  1 tablet Oral Daily  . simvastatin  20 mg Oral QHS  . sodium chloride flush  3 mL Intravenous Q12H  . tamsulosin  0.4 mg Oral Daily  . vitamin B-12  1,000 mcg Oral Daily   Continuous Infusions: . sodium chloride     PRN Meds: sodium chloride, acetaminophen, albuterol, ALPRAZolam, dextromethorphan-guaiFENesin, hydrALAZINE, nitroGLYCERIN, ondansetron (ZOFRAN) IV, oxyCODONE, sodium chloride flush   Vital Signs    Vitals:   07/26/20 0432 07/26/20 0727 07/26/20 0750 07/26/20 1202  BP: 117/72  (!) 130/73 (!) 153/75  Pulse: 74 72 52 82  Resp: 18 18 17 19   Temp: 97.9 F (36.6 C)  97.9 F (36.6 C) 98.2 F (36.8 C)  TempSrc: Oral  Oral   SpO2: 98% 96% 98% 98%  Weight: (!) 134.9 kg     Height:        Intake/Output Summary (Last 24 hours) at 07/26/2020 1356 Last data filed at 07/26/2020 0917 Gross per 24 hour  Intake 240 ml  Output 3050 ml  Net -2810 ml   Last 3 Weights 07/26/2020 07/25/2020 07/24/2020  Weight (lbs) 297 lb 6.4 oz 298 lb 300 lb 3.2 oz  Weight (kg) 134.9 kg 135.172 kg 136.17 kg      Telemetry    Afib with slow ventricular response  - Personally Reviewed  ECG      No new tracings - Personally Reviewed  Physical Exam   GEN: No acute distress.  Lying in bed Neck: JVD difficult to assess 2/2 body habitus Cardiac: IRIR with slow ventricular rate, no murmurs, rubs, or gallops.  Respiratory: Scattered wheezing and rhonchi. R reduced basilar breath sounds GI: Soft, nontender, non-distended  MS: 2+ lower extremity edema; No deformity. Neuro:  Nonfocal  Psych: Normal affect   Labs    High Sensitivity Troponin:  No results for input(s): TROPONINIHS in the last 720 hours.    Chemistry Recent Labs  Lab 07/23/20 0749 07/23/20 0749 07/24/20 0343 07/25/20 0418 07/26/20 0423  NA 141   < > 141 140 141  K 3.8   < > 4.1 4.5 4.0  CL 94*   < > 91* 91* 89*  CO2 39*   < > 37* 40* 44*  GLUCOSE 161*   < > 150* 217* 161*  BUN 16   < > 24* 34* 40*  CREATININE 1.28*   < > 1.24 1.27* 1.19  CALCIUM 9.0   < > 8.9 9.0 8.9  PROT 7.8  --   --   --   --   ALBUMIN 3.3*  --   --   --   --  AST 16  --   --   --   --   ALT 12  --   --   --   --   ALKPHOS 86  --   --   --   --   BILITOT 1.5*  --   --   --   --   GFRNONAA 55*   < > 57* 56* >60  GFRAA >60   < > >60 >60 >60  ANIONGAP 8   < > 13 9 8    < > = values in this interval not displayed.     Hematology Recent Labs  Lab 07/23/20 0749 07/24/20 0343 07/25/20 0418  WBC 17.1* 9.0 11.7*  RBC 3.97* 3.39* 3.51*  HGB 11.0* 9.4* 9.7*  HCT 38.2* 31.5* 32.8*  MCV 96.2 92.9 93.4  MCH 27.7 27.7 27.6  MCHC 28.8* 29.8* 29.6*  RDW 15.8* 15.9* 15.9*  PLT 205 146* 159    BNP Recent Labs  Lab 07/23/20 0749  BNP 567.0*     DDimer No results for input(s): DDIMER in the last 168 hours.   Radiology    No results found.  Cardiac Studies   Echo 05/2020 1. Left ventricular ejection fraction, by estimation, is >55%. The left  ventricle has normal function. Left ventricular endocardial border not  optimally defined to evaluate regional wall motion. Left ventricular  diastolic parameters are indeterminate.   2. Right ventricular systolic function was not well visualized. The right  ventricular size is not well visualized.  3. The mitral valve was not well visualized. Unable to assess mitral  valve regurgitation.  4. Tricuspid valve regurgitation not well visualized.  5. The aortic valve was not well visualized. Aortic valve regurgitation  not well assessed. No aortic stenosis is present.  6. Pulmonic valve regurgitation not well visualized.  7. The inferior vena cava is dilated in size with <50% respiratory  variability, suggesting right atrial pressure of 15 mmHg.   Patient Profile     74 y.o. male with history of CAD, ICM, DM 2, hypertension, hyperlipidemia, sleep apnea, prior CVA, and who is being seen today for acute on chronic diastolic congestive heart failure and atrial fibrillation.  Assessment & Plan    Acute on chronic diastolic congestive heart failure --Still reports shortness of breath.  Uses home oxygen at 2-3L.   --Recent echo as above with normal LVSF. Elevated RAP. Not clear images. --Remains volume overloaded on exam. --Volume status could be exacerbated by Afib. --Continue to monitor I's/O's, daily standing weights. --Continue IV diuresis as still volume up. --Daily BMET.  Replete electrolytes to goal. --CHF education.  Low-salt diet and fluid restriction. --Will need office follow-up.  Acute on chronic stage II kidney disease Solitary kidney --Daily BMET. Close monitoring given solitary kidney. --Current renal function and electrolytes stable.   Persistent atrial fibrillation with slow ventricular rate --Carvedilol discontinued given slow ventricular rate. --Asx with bradycardic rate. Denies dizziness. --TSH 2.088. Electrolytes wnl.  --Echo above with normal LVSF.Elevated RAP. --Compliant with anticoagulation for at least 1 month and therapeutic. CHA2DS2VASc score of at least 7 (CHF, HTN, age, DM, strokex2, vascular). --Could attempt DCCV this admission  to assist with relief of volume status.  Given his body habitus he likely will not hold cardioversion. Consider also that his bradycardia may worsen with cardioversion. Will need ambulation before discharge to reassess chronotropic competence.   COPD --Per IM. Likely contributing to sx. Continue oxygen.  HTN --BP controlled. Coreg has been held secondary to  bradycardia/slow ventricular rate.  Not in amlodipine due to lower extremity edema.  Consider retrial of amlodipine.  Continue hydralazine and losartan.  If worsening renal function, recommend holding losartan.  HLD --Continue current statin.  CAD --No chest pain.  Continue apixaban in lieu of ASA.  History of untreated sleep apnea --CPAP recommended.  Alcohol use --Recommend continue to abstain.   For questions or updates, please contact [facility] Please consult [email or web address] for contact info under        Signed, Lennon Alstrom, PA-C  07/26/2020, 1:56 PM

## 2020-07-26 NOTE — Progress Notes (Signed)
Mobility Specialist - Progress Note   07/26/20 1521  Mobility  Activity Ambulated in hall  Level of Assistance Moderate assist, patient does 50-74%  Assistive Device Front wheel walker  Distance Ambulated (ft) 120 ft  Mobility Response Tolerated well  Mobility performed by Mobility specialist  $Mobility charge 1 Mobility    Pre-mobility: 78 HR, 150/61 BP, 99% SpO2 During mobility: 80 HR, 98% SpO2 Post-mobility: 74 HR, 163/102 BP, 95% SpO2   Pt was lying in bed utilizing 2.5L O2 on arrival. Pt agreed to session. Pt was mod assist with RW and CGA for safety precautions. Pt ambulated a total of 120' in his room and hallway. Ambulation session was stopped after 31' d/t pt feeling the urgency to urinate. Pt asked for a urinal to use while out in the hallway. Pt states "this happens to me all the time". It's noticed that ambulation may be limited to pt's frequency of urinating. However, pt tolerated session well and was highly motivated to increase mobility. Pt was left in recliner with chair alarm set and call bell/phone in reach. Respiratory nurse entered room at the end of session. Nurse was notified of performance.   Filiberto Pinks Mobility Specialist 07/26/20, 3:48 PM

## 2020-07-27 LAB — CBC
HCT: 34.1 % — ABNORMAL LOW (ref 39.0–52.0)
Hemoglobin: 10.4 g/dL — ABNORMAL LOW (ref 13.0–17.0)
MCH: 28.5 pg (ref 26.0–34.0)
MCHC: 30.5 g/dL (ref 30.0–36.0)
MCV: 93.4 fL (ref 80.0–100.0)
Platelets: 190 10*3/uL (ref 150–400)
RBC: 3.65 MIL/uL — ABNORMAL LOW (ref 4.22–5.81)
RDW: 15.6 % — ABNORMAL HIGH (ref 11.5–15.5)
WBC: 11.7 10*3/uL — ABNORMAL HIGH (ref 4.0–10.5)
nRBC: 0 % (ref 0.0–0.2)

## 2020-07-27 LAB — BASIC METABOLIC PANEL
Anion gap: 8 (ref 5–15)
BUN: 39 mg/dL — ABNORMAL HIGH (ref 8–23)
CO2: 48 mmol/L — ABNORMAL HIGH (ref 22–32)
Calcium: 8.9 mg/dL (ref 8.9–10.3)
Chloride: 86 mmol/L — ABNORMAL LOW (ref 98–111)
Creatinine, Ser: 1.25 mg/dL — ABNORMAL HIGH (ref 0.61–1.24)
GFR calc Af Amer: >60 mL/min (ref >60)
GFR calc non Af Amer: 57 mL/min — ABNORMAL LOW (ref >60)
Glucose, Bld: 114 mg/dL — ABNORMAL HIGH (ref 70–99)
Potassium: 3.1 mmol/L — ABNORMAL LOW (ref 3.5–5.1)
Sodium: 142 mmol/L (ref 135–145)

## 2020-07-27 LAB — MAGNESIUM: Magnesium: 2.2 mg/dL (ref 1.7–2.4)

## 2020-07-27 MED ORDER — POTASSIUM CHLORIDE CRYS ER 20 MEQ PO TBCR
40.0000 meq | EXTENDED_RELEASE_TABLET | Freq: Once | ORAL | Status: AC
  Administered 2020-07-27: 40 meq via ORAL
  Filled 2020-07-27: qty 2

## 2020-07-27 MED ORDER — FUROSEMIDE 10 MG/ML IJ SOLN: 40.0000 mg | Freq: Every day | INTRAMUSCULAR | Status: DC

## 2020-07-27 MED ORDER — SPIRONOLACTONE 25 MG PO TABS
25.0000 mg | ORAL_TABLET | Freq: Every day | ORAL | Status: DC
  Administered 2020-07-27 – 2020-07-29 (×3): 25 mg via ORAL
  Filled 2020-07-27 (×3): qty 1

## 2020-07-27 NOTE — Progress Notes (Signed)
Physical Therapy Treatment Patient Details Name: Victor Daniels MRN: [id number] DOB: [birth year] Today's Date: 07/27/2020    History of Present Illness Per MD Note:Victor Daniels is a 74 y.o. male with medical history significant of hypertension, hyperlipidemia COPD, on 2 L oxygen, stroke, PAD, OSA, CAD, DVT atrial fibrillation on Eliquis, dCHF, alcoholic gastritis, anemia, who presents with shortness breath.    PT Comments    Pt was seated in recliner upon arriving. He agrees to PT session and is cooperative and pleasant throughout. On 2 L [place] at rest. Did require increase to 4 L during ambulation to maintain > 88%. Session very limited by several incontinent episode during session. Overall, pt very motivated for rehab and will benefit from continued skilled PT to address deficits and assist pt to PLOF.     Follow Up Recommendations  SNF     Equipment Recommendations  Rolling walker with 5" wheels    Recommendations for Other Services       Precautions / Restrictions Precautions Precautions: Fall Restrictions Weight Bearing Restrictions: No    Mobility  Bed Mobility               General bed mobility comments: pt was seated in recliner pre/post session  Transfers Overall transfer level: Needs assistance Equipment used: Rolling walker (2 wheeled) Transfers: Sit to/from Stand Sit to Stand: Min assist         General transfer comment: Pt required min assist to STS from recliner. performed several times 2/2 to having incontinent of urine episodes.  Ambulation/Gait Ambulation/Gait assistance: Min guard Gait Distance (Feet): 45 Feet Assistive device: Rolling walker (2 wheeled) Gait Pattern/deviations: Step-through pattern;Trunk flexed Gait velocity: decreased   General Gait Details: pt ambulated ~ 45 ft with 2 L o2 at first however due to desaturation required increase to 4 L to maintain >88%.    Stairs             Wheelchair Mobility     Modified Rankin (Stroke Patients Only)       Balance Overall balance assessment: Needs assistance Sitting-balance support: Bilateral upper extremity supported;Feet supported Sitting balance-Leahy Scale: Good Sitting balance - Comments: no LOB in sitting   Standing balance support: Bilateral upper extremity supported Standing balance-Leahy Scale: Good Standing balance comment: no LOB in standing with BUe support                            Cognition Arousal/Alertness: Awake/alert Behavior During Therapy: WFL for tasks assessed/performed Overall Cognitive Status: Within Functional Limits for tasks assessed                                 General Comments: Pt is A and O x 4 and agreeable and cooperative throughotu session      Exercises      General Comments        Pertinent Vitals/Pain      Home Living                      Prior Function            PT Goals (current goals can now be found in the care plan section) Acute Rehab PT Goals Patient Stated Goal: rehab then home Progress towards PT goals: Progressing toward goals    Frequency    Min 2X/week      PT  Plan Current plan remains appropriate    Co-evaluation              AM-PAC PT "6 Clicks" Mobility   Outcome Measure  Help needed turning from your back to your side while in a flat bed without using bedrails?: A Little Help needed moving from lying on your back to sitting on the side of a flat bed without using bedrails?: A Little Help needed moving to and from a bed to a chair (including a wheelchair)?: A Lot Help needed standing up from a chair using your arms (e.g., wheelchair or bedside chair)?: A Lot Help needed to walk in hospital room?: A Lot Help needed climbing 3-5 steps with a railing? : A Lot 6 Click Score: 14    End of Session Equipment Utilized During Treatment: Gait belt Activity Tolerance: Patient limited by fatigue;Patient tolerated  treatment well Patient left: in chair;with call bell/phone within reach;with chair alarm set;with family/visitor present Nurse Communication: Mobility status PT Visit Diagnosis: Unsteadiness on feet (R26.81);Muscle weakness (generalized) (M62.81);Difficulty in walking, not elsewhere classified (R26.2);Pain     Time: 2334-3568 PT Time Calculation (min) (ACUTE ONLY): 31 min  Charges:  $Gait Training: 8-22 mins $Therapeutic Activity: 8-22 mins                    Jetta Lout PTA 07/27/20, 4:30 PM

## 2020-07-27 NOTE — Progress Notes (Signed)
Progress Note  Patient Name: Victor Daniels Date of Encounter: 07/27/2020  [facility] Cardiologist: Dossie Arbour, MD PhD  Subjective   Patient feels well with improved shortness of breath.  Breathing is almost back to baseline.  No chest pain, palpitations, or edema.  He notes some right shoulder pain, which is chronic.  Inpatient Medications    Scheduled Meds: . apixaban  5 mg Oral BID  . fluticasone furoate-vilanterol  1 puff Inhalation Daily   And  . umeclidinium bromide  1 puff Inhalation Daily  . furosemide  40 mg Intravenous Q12H  . ipratropium-albuterol  3 mL Nebulization Q6H  . iron polysaccharides  150 mg Oral Q breakfast  . losartan  25 mg Oral Daily  . multivitamin with minerals  1 tablet Oral Daily  . simvastatin  20 mg Oral QHS  . sodium chloride flush  3 mL Intravenous Q12H  . tamsulosin  0.4 mg Oral Daily  . vitamin B-12  1,000 mcg Oral Daily   Continuous Infusions: . sodium chloride     PRN Meds: sodium chloride, acetaminophen, albuterol, ALPRAZolam, dextromethorphan-guaiFENesin, hydrALAZINE, nitroGLYCERIN, ondansetron (ZOFRAN) IV, oxyCODONE, sodium chloride flush   Vital Signs    Vitals:   07/27/20 0419 07/27/20 0744 07/27/20 0810 07/27/20 1116  BP: 138/80  126/79 (!) 142/60  Pulse: 84  71 (!) 59  Resp: 18  17 18   Temp: 98.4 F (36.9 C)  (!) 97.5 F (36.4 C) 97.7 F (36.5 C)  TempSrc: Oral     SpO2: 91% 97% 93% 97%  Weight: 130.1 kg     Height:        Intake/Output Summary (Last 24 hours) at 07/27/2020 1118 Last data filed at 07/27/2020 1113 Gross per 24 hour  Intake 480 ml  Output 3450 ml  Net -2970 ml   Last 3 Weights 07/27/2020 07/26/2020 07/25/2020  Weight (lbs) 286 lb 14.4 oz 297 lb 6.4 oz 298 lb  Weight (kg) 130.137 kg 134.9 kg 135.172 kg      Telemetry    Atrial fibrillation with ventricular rates of 50 to 110 bpm - Personally Reviewed  ECG    No new tracing.  Physical Exam   GEN: No acute distress.   Neck: No gross  JVD, though evaluation is limited by body habitus. Cardiac:  Irregularly irregular with 1/6 systolic murmur. Respiratory: Clear to auscultation bilaterally. GI: Soft, nontender, non-distended  MS:  Trace to 1+ pretibial edema. Neuro:  Nonfocal  Psych: Normal affect   Labs    High Sensitivity Troponin:  No results for input(s): TROPONINIHS in the last 720 hours.    Chemistry Recent Labs  Lab 07/23/20 0749 07/24/20 0343 07/25/20 0418 07/26/20 0423 07/27/20 0608  NA 141   < > 140 141 142  K 3.8   < > 4.5 4.0 3.1*  CL 94*   < > 91* 89* 86*  CO2 39*   < > 40* 44* 48*  GLUCOSE 161*   < > 217* 161* 114*  BUN 16   < > 34* 40* 39*  CREATININE 1.28*   < > 1.27* 1.19 1.25*  CALCIUM 9.0   < > 9.0 8.9 8.9  PROT 7.8  --   --   --   --   ALBUMIN 3.3*  --   --   --   --   AST 16  --   --   --   --   ALT 12  --   --   --   --  ALKPHOS 86  --   --   --   --   BILITOT 1.5*  --   --   --   --   GFRNONAA 55*   < > 56* >60 57*  GFRAA >60   < > >60 >60 >60  ANIONGAP 8   < > 9 8 8    < > = values in this interval not displayed.     Hematology Recent Labs  Lab 07/24/20 0343 07/25/20 0418 07/27/20 0608  WBC 9.0 11.7* 11.7*  RBC 3.39* 3.51* 3.65*  HGB 9.4* 9.7* 10.4*  HCT 31.5* 32.8* 34.1*  MCV 92.9 93.4 93.4  MCH 27.7 27.6 28.5  MCHC 29.8* 29.6* 30.5  RDW 15.9* 15.9* 15.6*  PLT 146* 159 190    BNP Recent Labs  Lab 07/23/20 0749  BNP 567.0*     DDimer No results for input(s): DDIMER in the last 168 hours.   Radiology    No results found.  Cardiac Studies   Echocardiogram (06/22/2020): 1. Left ventricular ejection fraction, by estimation, is >55%. The left  ventricle has normal function. Left ventricular endocardial border not  optimally defined to evaluate regional wall motion. Left ventricular  diastolic parameters are indeterminate.  2. Right ventricular systolic function was not well visualized. The right  ventricular size is not well visualized.  3. The  mitral valve was not well visualized. Unable to assess mitral  valve regurgitation.  4. Tricuspid valve regurgitation not well visualized.  5. The aortic valve was not well visualized. Aortic valve regurgitation  not well assessed. No aortic stenosis is present.  6. Pulmonic valve regurgitation not well visualized.  7. The inferior vena cava is dilated in size with <50% respiratory  variability, suggesting right atrial pressure of 15 mmHg.   Patient Profile     74 y.o. male with history of coronary artery disease, ischemic cardiomyopathy, chronic HFpEF, hypertension, hyperlipidemia, type 2 diabetes mellitus, obstructive sleep apnea, and stroke, admitted with acute on chronic HFpEF and persistent atrial fibrillation.  Assessment & Plan    Acute on chronic HFpEF: Volume status seems to be improving with trace to 1+ pretibial edema on exam.  CO2 is also trending up suggesting possible contraction alkalosis superimposed on chronic CO2 retention in the setting of sleep apnea.  BUN has also trended up over the last few days.  Patient is net -8.7 mL through 7 AM.  His weight is also down approximately 7 kg.  Decrease furosemide to 40 mg IV daily.  Hopefully, this can be transition to an oral regimen in the next 1 to 2 days.  Add spironolactone 25 mg daily.  Persistent atrial fibrillation: Ventricular rates reasonably well controlled.  I agree with Dr. 65 that maintenance of sinus rhythm without antiarrhythmic therapy would be challenging.  This can be readdressed at on an outpatient basis once the euvolemic state has been achieved.  Continue apixaban 5 mg p.o. twice daily.  Defer adding beta-blocker or nondihydropyridine calcium channel blocker in the setting of reasonable ventricular rate control and periods of slow ventricular response in the setting of atrial fibrillation.  Hypertension: Blood pressure normal to mildly elevated.  Add spironolactone 25 mg daily.  Continue losartan  25 mg daily.  For questions or updates, please contact [facility] Please consult [email or web address] for contact info under [facility].    Signed, [facility], MD  07/27/2020, 11:18 AM

## 2020-07-27 NOTE — Progress Notes (Signed)
Patient has rested quietly this shift. Sat in the chair for awhile and worked with PT today. Patient's chronic pain managed with PRN orders. He declined wound care this shift - dressing currently clean and intact.

## 2020-07-27 NOTE — Progress Notes (Signed)
PROGRESS NOTE    Victor Daniels   EYC:[id number]  DOB: [birth year]  PCP: [name], MD    DOA: 07/23/2020 LOS: 4   Brief Narrative   Victor Daniels is a 74 y.o. male with medical history significant of hypertension, hyperlipidemia COPD, on 2 L oxygen, stroke, PAD, OSA, CAD, DVT atrial fibrillation on Eliquis, dCHF, alcoholic gastritis, anemia, who presented to the ED on 07/23/20 with 4 days of progressive shortness breath, productive cough and worsening bilateral lower extremity edema.   In the ED, initial O2 sat was 63% on his home oxygen of 2 L/min.  VBG showed hypercarbia with pCO2 of 108.  Started on Bipap in the ED.  Afebrile 99.1, normotensive, tachypneic.  Labs notable for BNP 567, negative Covid-19 PCR, normal lactate 1.9, stable renal function, leukocytosis 17.1k, Hbg 11.0 (from 9.3 earlier this month).  Chest xray showed some pulmonary edema and post-op lung volume reduction changes, stable.  Admitted to hospitalist service for further management of acute on chronic respiratory failure with hypoxia and hypercapnea due to combination of decompensated CHF and acute exacerbation of COPD.      Assessment & Plan   Principal Problem:   Acute on chronic respiratory failure with hypoxia (HCC) Active Problems:   Hyperlipidemia   Hypertension   CAD (coronary artery disease)   Iron deficiency anemia   CKD (chronic kidney disease), stage IIIa   Acute on chronic diastolic CHF (congestive heart failure) (HCC)   Atrial fibrillation (HCC)   DVT (deep venous thrombosis) (HCC)   Stroke (HCC)   COPD with acute bronchitis (HCC)   Acute on chronic respiratory failure with hypoxia and hypercarbia - secondary to decompensated CHF and acute exacerbation of COPD.  Presented with expiratory wheezing consistent with COPD in addition to 3+ pitting lower extremity edema and elevated BNP, pulmonary edema on chest x-ray consistent with CHF.  Continue supplemental oxygen to  maintain O2 sat greater than 90%.  Baseline O2 requirement 2-3 L/min.  Acute on chronic diastolic CHF - recent echo 06/22/2020 showed EF greater than 55%.  Follows with Dr. Mariah Milling.  Cardiology consulted.  Continue IV Lasix 40 mg twice daily.  Daily weights and strict I/O's.  Low-sodium diet with fluid restriction. Net fluid balance -9173 since admission.  Remains volume overloaded, continue IV diuresis per cardiology.  Reduced to 40 mg Lasix IV daily (from BID).  Acute exacerbation of COPD /acute bronchitis -present on admission.  No longer wheezing.   Hypercarbic with PCO2 of 108 on admission, placed on BiPAP in the ED.  Completed 3 days steroids.   Markedly improved without wheezing on exam.  Finish up Z-Pak.  Continue Mucinex.   Scheduled DuoNebs with as needed albuterol nebs.  Incentive spirometry.   Follow-up on blood and sputum cultures.  BiPAP as needed.  Resume home Trelegy.  Persistent A. Fib -rate on the low side, Coreg discontinued by cardiology.  Continue Eliquis.  Cardioversion is being discussed.  Hypertension -continue Coreg and Cozaar.  Hydralazine as needed.  Hyperlipidemia -continue Zocor  Coronary artery disease -stable, no active chest pain.  Continue statin and as needed SL nitro.  Iron deficiency anemia -chronic and stable on admission.  Continue iron supplement.  CBCs to monitor.  Solitary kidney CKD stage IIIa -renal function stable, monitor closely while diuresing.  Daily BMP.  Atrial fibrillation -rate controlled.  Continue Coreg and Eliquis.  DVT -continue Eliquis  History of stroke -continue Zocor  Sleep apnea -not on CPAP, contributes to respiratory  issues, A. fib and complicates overall management.  Highly recommend compliance with CPAP.   Patient BMI: Body mass index is 38.91 kg/m.   DVT prophylaxis:  apixaban (ELIQUIS) tablet 5 mg   Diet:  Diet Orders (From admission, onward)    Start     Ordered   07/23/20 1735  Diet Heart Room service  appropriate? Yes; Fluid consistency: Thin; Fluid restriction: 1500 mL Fluid  Diet effective now       Question Answer Comment  Room service appropriate? Yes   Fluid consistency: Thin   Fluid restriction: 1500 mL Fluid      07/23/20 1734            Code Status: Full Code    Subjective 07/27/20    Patient seen at bedside this morning.  No acute events reported overnight.  Says he is feeling much better, denies wheezing.  Says leg swelling is improving.  Denies fevers or chills, chest pain or shortness of breath, N/V/D or other complaints.  Says he is very happy with the care he is getting from everyone here.   Disposition Plan & Communication   Status is: Inpatient  Remains inpatient appropriate because:IV treatments appropriate due to intensity of illness or inability to take PO.  Ongoing IV diuresis.   Dispo: The patient is from: Home              Anticipated d/c is to: Home              Anticipated d/c date is: 2-3 days              Patient currently is not medically stable to d/c.        Family Communication: None at bedside, will attempt to call   Consults, Procedures, Significant Events   Consultants:   Cardiology  Procedures:   None  Antimicrobials:   None   Objective   Vitals:   07/27/20 0744 07/27/20 0810 07/27/20 1116 07/27/20 1309  BP:  126/79 (!) 142/60   Pulse:  71 (!) 59   Resp:  17 18   Temp:  (!) 97.5 F (36.4 C) 97.7 F (36.5 C)   TempSrc:      SpO2: 97% 93% 97% 99%  Weight:      Height:        Intake/Output Summary (Last 24 hours) at 07/27/2020 1416 Last data filed at 07/27/2020 1330 Gross per 24 hour  Intake 960 ml  Output 3450 ml  Net -2490 ml   Filed Weights   07/25/20 0333 07/26/20 0432 07/27/20 0419  Weight: (!) 135.2 kg (!) 134.9 kg 130.1 kg    Physical Exam:  General exam: awake, alert, no acute distress, obese HEENT: moist mucus membranes, hearing grossly normal  Respiratory system: Diminished bases, no  wheezing, overall clear, normal respiratory effort on 3 L/min nasal cannula oxygen. Cardiovascular system: normal S1/S2, RRR, pitting edema of bilateral lower extremities mildly improved Central nervous system: A&O x4. no gross focal neurologic deficits, normal speech Extremities: moves all, improved pitting edema BLE's, venous stasis dermatitis Psychiatry: normal mood, congruent affect  Labs   Data Reviewed: I have personally reviewed following labs and imaging studies  CBC: Recent Labs  Lab 07/23/20 0749 07/24/20 0343 07/25/20 0418 07/27/20 0608  WBC 17.1* 9.0 11.7* 11.7*  HGB 11.0* 9.4* 9.7* 10.4*  HCT 38.2* 31.5* 32.8* 34.1*  MCV 96.2 92.9 93.4 93.4  PLT 205 146* 159 190   Basic Metabolic Panel: Recent Labs  Lab  07/23/20 0749 07/24/20 0343 07/25/20 0418 07/26/20 0423 07/27/20 0608  NA 141 141 140 141 142  K 3.8 4.1 4.5 4.0 3.1*  CL 94* 91* 91* 89* 86*  CO2 39* 37* 40* 44* 48*  GLUCOSE 161* 150* 217* 161* 114*  BUN 16 24* 34* 40* 39*  CREATININE 1.28* 1.24 1.27* 1.19 1.25*  CALCIUM 9.0 8.9 9.0 8.9 8.9  MG 1.9 2.0 2.0  --  2.2   GFR: Estimated Creatinine Clearance: 73.4 mL/min (A) (by C-G formula based on SCr of 1.25 mg/dL (H)). Liver Function Tests: Recent Labs  Lab 07/23/20 0749  AST 16  ALT 12  ALKPHOS 86  BILITOT 1.5*  PROT 7.8  ALBUMIN 3.3*   No results for input(s): LIPASE, AMYLASE in the last 168 hours. No results for input(s): AMMONIA in the last 168 hours. Coagulation Profile: Recent Labs  Lab 07/23/20 0749  INR 1.3*   Cardiac Enzymes: No results for input(s): CKTOTAL, CKMB, CKMBINDEX, TROPONINI in the last 168 hours. BNP (last 3 results) No results for input(s): PROBNP in the last 8760 hours. HbA1C: No results for input(s): HGBA1C in the last 72 hours. CBG: No results for input(s): GLUCAP in the last 168 hours. Lipid Profile: No results for input(s): CHOL, HDL, LDLCALC, TRIG, CHOLHDL, LDLDIRECT in the last 72 hours. Thyroid Function  Tests: No results for input(s): TSH, T4TOTAL, FREET4, T3FREE, THYROIDAB in the last 72 hours. Anemia Panel: No results for input(s): VITAMINB12, FOLATE, FERRITIN, TIBC, IRON, RETICCTPCT in the last 72 hours. Sepsis Labs: Recent Labs  Lab 07/23/20 0749  LATICACIDVEN 1.9    Recent Results (from the past 240 hour(s))  SARS Coronavirus 2 by RT PCR (hospital order, performed in [facility] lab) Nasopharyngeal Nasopharyngeal Swab     Status: None   Collection Time: 07/23/20  7:40 AM   Specimen: Nasopharyngeal Swab  Result Value Ref Range Status   SARS Coronavirus 2 NEGATIVE NEGATIVE Final    Comment: (NOTE) SARS-CoV-2 target nucleic acids are NOT DETECTED.  The SARS-CoV-2 RNA is generally detectable in upper and lower respiratory specimens during the acute phase of infection. The lowest concentration of SARS-CoV-2 viral copies this assay can detect is 250 copies / mL. A negative result does not preclude SARS-CoV-2 infection and should not be used as the sole basis for treatment or other patient management decisions.  A negative result may occur with improper specimen collection / handling, submission of specimen other than nasopharyngeal swab, presence of viral mutation(s) within the areas targeted by this assay, and inadequate number of viral copies (<250 copies / mL). A negative result must be combined with clinical observations, patient history, and epidemiological information.  Fact Sheet for Patients:   BoilerBrush.com.cyhttps://www.fda.gov/media/136312/download  Fact Sheet for Healthcare Providers: https://pope.com/https://www.fda.gov/media/136313/download  This test is not yet approved or  cleared by the [place] FDA and has been authorized for detection and/or diagnosis of SARS-CoV-2 by FDA under an Emergency Use Authorization (EUA).  This EUA will remain in effect (meaning this test can be used) for the duration of the COVID-19 declaration under Section 564(b)(1) of the Act, 21  U.S.C. section 360bbb-3(b)(1), unless the authorization is terminated or revoked sooner.  Performed at [facility], [address] [place], [place] [address]   Blood culture (routine single)     Status: None (Preliminary result)   Collection Time: 07/23/20  7:49 AM   Specimen: BLOOD  Result Value Ref Range Status   Specimen Description BLOOD LEFT ANTECUBITAL  Final  Special Requests   Final    BOTTLES DRAWN AEROBIC AND ANAEROBIC Blood Culture adequate volume   Culture   Final    NO GROWTH 4 DAYS Performed at [facility], [address] [place], [place] [address]    Report Status PENDING  Incomplete      Imaging Studies   No results found.   Medications   Scheduled Meds: . apixaban  5 mg Oral BID  . fluticasone furoate-vilanterol  1 puff Inhalation Daily   And  . umeclidinium bromide  1 puff Inhalation Daily  . [START ON 07/28/2020] furosemide  40 mg Intravenous Daily  . ipratropium-albuterol  3 mL Nebulization Q6H  . iron polysaccharides  150 mg Oral Q breakfast  . losartan  25 mg Oral Daily  . multivitamin with minerals  1 tablet Oral Daily  . potassium chloride  40 mEq Oral Once  . simvastatin  20 mg Oral QHS  . sodium chloride flush  3 mL Intravenous Q12H  . spironolactone  25 mg Oral Daily  . tamsulosin  0.4 mg Oral Daily  . vitamin B-12  1,000 mcg Oral Daily   Continuous Infusions: . sodium chloride         LOS: 4 days    Time spent: 25  Pennie Banter, DO [facility]  07/27/2020, 2:16 PM    If 7PM-7AM, please contact night-coverage. How to contact the [facility] Attending or Consulting provider 7A - 7P or covering provider during after hours 7P -7A, for this patient?    1. Check the care team in [facility] and look for a) attending/consulting [facility] provider listed and b) the [facility] team listed 2. Log into [email or web address] and use [place]'s universal password to access. If you do not have the password, please contact the hospital  operator. 3. Locate the [facility] provider you are looking for under [facility] and page to a number that you can be directly reached. 4. If you still have difficulty reaching the provider, please page the [facility] (Director on Call) for the Hospitalists listed on amion for assistance.

## 2020-07-27 NOTE — Plan of Care (Signed)
  Problem: Education: Goal: Ability to verbalize understanding of medication therapies will improve Outcome: Not Progressing  Pt requesting soft drinks multiple times this shift.  Diet and fluid restriction reviewed with patient.

## 2020-07-28 ENCOUNTER — Other Ambulatory Visit: Payer: Self-pay | Admitting: *Deleted

## 2020-07-28 DIAGNOSIS — N1831 Chronic kidney disease, stage 3a: Secondary | ICD-10-CM

## 2020-07-28 DIAGNOSIS — J9622 Acute and chronic respiratory failure with hypercapnia: Secondary | ICD-10-CM

## 2020-07-28 DIAGNOSIS — J441 Chronic obstructive pulmonary disease with (acute) exacerbation: Secondary | ICD-10-CM

## 2020-07-28 LAB — CBC
HCT: 34.8 % — ABNORMAL LOW (ref 39.0–52.0)
Hemoglobin: 10 g/dL — ABNORMAL LOW (ref 13.0–17.0)
MCH: 27.4 pg (ref 26.0–34.0)
MCHC: 28.7 g/dL — ABNORMAL LOW (ref 30.0–36.0)
MCV: 95.3 fL (ref 80.0–100.0)
Platelets: 165 10*3/uL (ref 150–400)
RBC: 3.65 MIL/uL — ABNORMAL LOW (ref 4.22–5.81)
RDW: 15.6 % — ABNORMAL HIGH (ref 11.5–15.5)
WBC: 8.1 10*3/uL (ref 4.0–10.5)
nRBC: 0 % (ref 0.0–0.2)

## 2020-07-28 LAB — BASIC METABOLIC PANEL
Anion gap: 6 (ref 5–15)
BUN: 34 mg/dL — ABNORMAL HIGH (ref 8–23)
CO2: 49 mmol/L — ABNORMAL HIGH (ref 22–32)
Calcium: 8.8 mg/dL — ABNORMAL LOW (ref 8.9–10.3)
Chloride: 88 mmol/L — ABNORMAL LOW (ref 98–111)
Creatinine, Ser: 0.93 mg/dL (ref 0.61–1.24)
GFR calc Af Amer: >60 mL/min (ref >60)
GFR calc non Af Amer: >60 mL/min (ref >60)
Glucose, Bld: 101 mg/dL — ABNORMAL HIGH (ref 70–99)
Potassium: 3.6 mmol/L (ref 3.5–5.1)
Sodium: 143 mmol/L (ref 135–145)

## 2020-07-28 LAB — CULTURE, BLOOD (SINGLE)
Culture: NO GROWTH
Special Requests: ADEQUATE

## 2020-07-28 LAB — SARS CORONAVIRUS 2 BY RT PCR (HOSPITAL ORDER, PERFORMED IN ~~LOC~~ HOSPITAL LAB): SARS Coronavirus 2: NEGATIVE

## 2020-07-28 MED ORDER — FUROSEMIDE 10 MG/ML IJ SOLN
40.0000 mg | Freq: Two times a day (BID) | INTRAMUSCULAR | Status: DC
  Administered 2020-07-28 – 2020-07-29 (×3): 40 mg via INTRAVENOUS
  Filled 2020-07-28 (×3): qty 4

## 2020-07-28 NOTE — Patient Outreach (Signed)
[facility]) Care Management  07/28/2020  Victor Daniels [birth year] [id number]   Care Coordination   Patient was readmitted to [facility] on 07/23/20 . Will notify hospital liaison.   Plan Will follow for pending disposition plans   [name], RN, BSN  [facility] Care Management,Care Management Coordinator  [phone]- Mobile [phone]- Toll Free Main Office

## 2020-07-28 NOTE — Progress Notes (Signed)
Patient ID: Victor Daniels, male   DOB: [birth year], 74 y.o.   MRN: [id number] [facility] PROGRESS NOTE  QUIENTIN JENT HXT:[id number] DOB: 04/12/1946 DOA: 07/23/2020 PCP: [name], MD  HPI/Subjective: Patient feeling better.  Always has shortness of breath.  He is chronically on oxygen between 2 to 3 L.  Patient states that he has 1 kidney.  Objective: Vitals:   07/28/20 0819 07/28/20 1204  BP:  123/64  Pulse:  93  Resp:  17  Temp:  97.9 F (36.6 C)  SpO2: 95% (!) 88%    Intake/Output Summary (Last 24 hours) at 07/28/2020 1331 Last data filed at 07/28/2020 1204 Gross per 24 hour  Intake 2043 ml  Output 3450 ml  Net -1407 ml   Filed Weights   07/26/20 0432 07/27/20 0419 07/28/20 0435  Weight: (!) 134.9 kg 130.1 kg 130.4 kg    ROS: Review of Systems  Respiratory: Positive for shortness of breath.   Cardiovascular: Negative for chest pain.  Gastrointestinal: Negative for abdominal pain, constipation, diarrhea, nausea and vomiting.   Exam: Physical Exam HENT:     Nose: No mucosal edema.     Mouth/Throat:     Pharynx: No oropharyngeal exudate.  Eyes:     General: Lids are normal.     Conjunctiva/sclera: Conjunctivae normal.     Pupils: Pupils are equal, round, and reactive to light.  Cardiovascular:     Rate and Rhythm: Normal rate and regular rhythm.     Heart sounds: Normal heart sounds, S1 normal and S2 normal.  Pulmonary:     Breath sounds: Examination of the right-lower field reveals decreased breath sounds. Examination of the left-lower field reveals decreased breath sounds. Decreased breath sounds present. No wheezing, rhonchi or rales.  Abdominal:     Palpations: Abdomen is soft.     Tenderness: There is no abdominal tenderness.  Musculoskeletal:     Right ankle: Swelling present.     Left ankle: Swelling present.  Skin:    General: Skin is warm.     Findings: No rash.  Neurological:     Mental Status: He is alert and oriented to  person, place, and time.       Data Reviewed: Basic Metabolic Panel: Recent Labs  Lab 07/23/20 0749 07/23/20 0749 07/24/20 0343 07/25/20 0418 07/26/20 0423 07/27/20 0608 07/28/20 0552  NA 141   < > 141 140 141 142 143  K 3.8   < > 4.1 4.5 4.0 3.1* 3.6  CL 94*   < > 91* 91* 89* 86* 88*  CO2 39*   < > 37* 40* 44* 48* 49*  GLUCOSE 161*   < > 150* 217* 161* 114* 101*  BUN 16   < > 24* 34* 40* 39* 34*  CREATININE 1.28*   < > 1.24 1.27* 1.19 1.25* 0.93  CALCIUM 9.0   < > 8.9 9.0 8.9 8.9 8.8*  MG 1.9  --  2.0 2.0  --  2.2  --    < > = values in this interval not displayed.   Liver Function Tests: Recent Labs  Lab 07/23/20 0749  AST 16  ALT 12  ALKPHOS 86  BILITOT 1.5*  PROT 7.8  ALBUMIN 3.3*   CBC: Recent Labs  Lab 07/23/20 0749 07/24/20 0343 07/25/20 0418 07/27/20 0608 07/28/20 0552  WBC 17.1* 9.0 11.7* 11.7* 8.1  HGB 11.0* 9.4* 9.7* 10.4* 10.0*  HCT 38.2* 31.5* 32.8* 34.1* 34.8*  MCV 96.2 92.9 93.4 93.4  95.3  PLT 205 146* 159 190 165   BNP (last 3 results) Recent Labs    06/21/20 0749 07/23/20 0749  BNP 488.4* 567.0*      Recent Results (from the past 240 hour(s))  SARS Coronavirus 2 by RT PCR (hospital order, performed in [facility] hospital lab) Nasopharyngeal Nasopharyngeal Swab     Status: None   Collection Time: 07/23/20  7:40 AM   Specimen: Nasopharyngeal Swab  Result Value Ref Range Status   SARS Coronavirus 2 NEGATIVE NEGATIVE Final    Comment: (NOTE) SARS-CoV-2 target nucleic acids are NOT DETECTED.  The SARS-CoV-2 RNA is generally detectable in upper and lower respiratory specimens during the acute phase of infection. The lowest concentration of SARS-CoV-2 viral copies this assay can detect is 250 copies / mL. A negative result does not preclude SARS-CoV-2 infection and should not be used as the sole basis for treatment or other patient management decisions.  A negative result may occur with improper specimen collection / handling,  submission of specimen other than nasopharyngeal swab, presence of viral mutation(s) within the areas targeted by this assay, and inadequate number of viral copies (<250 copies / mL). A negative result must be combined with clinical observations, patient history, and epidemiological information.  Fact Sheet for Patients:   BoilerBrush.com.cy  Fact Sheet for Healthcare Providers: https://pope.com/  This test is not yet approved or  cleared by the [place] FDA and has been authorized for detection and/or diagnosis of SARS-CoV-2 by FDA under an Emergency Use Authorization (EUA).  This EUA will remain in effect (meaning this test can be used) for the duration of the COVID-19 declaration under Section 564(b)(1) of the Act, 21 U.S.C. section 360bbb-3(b)(1), unless the authorization is terminated or revoked sooner.  Performed at [facility], [address] [place], [place] [address]   Blood culture (routine single)     Status: None   Collection Time: 07/23/20  7:49 AM   Specimen: BLOOD  Result Value Ref Range Status   Specimen Description BLOOD LEFT ANTECUBITAL  Final   Special Requests   Final    BOTTLES DRAWN AEROBIC AND ANAEROBIC Blood Culture adequate volume   Culture   Final    NO GROWTH 5 DAYS Performed at [facility], [address] [place], [place] [address]    Report Status 07/28/2020 FINAL  Final     Scheduled Meds: . apixaban  5 mg Oral BID  . fluticasone furoate-vilanterol  1 puff Inhalation Daily   And  . umeclidinium bromide  1 puff Inhalation Daily  . furosemide  40 mg Intravenous BID  . ipratropium-albuterol  3 mL Nebulization Q6H  . iron polysaccharides  150 mg Oral Q breakfast  . losartan  25 mg Oral Daily  . multivitamin with minerals  1 tablet Oral Daily  . simvastatin  20 mg Oral QHS  . sodium chloride flush  3 mL Intravenous Q12H  . spironolactone  25 mg Oral Daily  .  tamsulosin  0.4 mg Oral Daily  . vitamin B-12  1,000 mcg Oral Daily   Continuous Infusions: . sodium chloride      Assessment/Plan:  1. Acute on chronic diastolic congestive heart failure (HFpEF).  Cardiology increase Lasix to 40 mg IV twice daily today.  They are considering a right heart cath. 2. Acute on chronic respiratory failure with hypoxia and hypercarbia.  The patient is a candidate for noninvasive ventilation.  The patient states he will not use it.  He will be high risk for readmissions. 3. Persistent atrial fibrillation on Eliquis 4. COPD exacerbation.  Finished Z-Pak.  Continue inhalers. 5. Essential hypertension on losartan and spironolactone 6. Chronic kidney disease stage IIIa with solitary kidney.  Watch with diuresis. 7. Hyperlipidemia unspecified on Zocor      Code Status:     Code Status Orders  (From admission, onward)         Start     Ordered   07/23/20 1735  Full code  Continuous        07/23/20 1734        Code Status History    Date Active Date Inactive Code Status Order ID Comments User Context   06/21/2020 1326 06/25/2020 2123 Full Code [id number]  Lucile Shutters, MD ED   Advance Care Planning Activity     Family Communication: Tried to call the patient's wife Disposition Plan: Status is: Inpatient  Dispo: The patient is from: Home              Anticipated d/c is to: Rehab              Anticipated d/c date is: Potentially 07/29/2020 depending on whether or not cardiology wants to do a right heart cath or not.              Patient currently being diuresed with IV Lasix 40 mg IV twice daily.  Cardiology may want to do a right heart cath.  Consultants:  Cardiology  Time spent: 28 minutes  Aleena Kir[employer]

## 2020-07-28 NOTE — Progress Notes (Signed)
Patient refusing for this RN to change the dressing on his RLE.  States that he does not want or need it changed today.

## 2020-07-28 NOTE — Progress Notes (Signed)
Occupational Therapy Treatment Patient Details Name: Victor Daniels MRN: [id number] DOB: 08-20-1946 Today's Date: 07/28/2020    History of present illness Per MD Note:Victor Daniels is a 74 y.o. male with medical history significant of hypertension, hyperlipidemia COPD, on 2 L oxygen, stroke, PAD, OSA, CAD, DVT atrial fibrillation on Eliquis, dCHF, alcoholic gastritis, anemia, who presents with shortness breath.   OT comments  Pt seen for OT treatment on this date. Upon arrival to room pt awake/alert, seated in room recliner with spouse present. Pt agreeable to OT tx session and denies pain this date. Pt and caregiver educated on energy conservation strategies including pursed lip breathing, activity pacing, safe use of AE/DME for ADL management, and falls prevention. Pt caregiver at bedside expresses satisfaction that pt has gotten up more at the hospital than he typically does at home and pt/caregiver state they are pleased the pt has been more independent with ADL tasks including bathing and peri-care management. Pt return demonstrates use of PLB strategy with therapist directing pt to pulse-ox to see spO2 improve with breathing technique. Pt spO2 at rest at start of session noted to be 90% with pt on 3 L [place]. Pt performs PLB with min cueing from therapist for technique and spO2 improves to 93% at end of session. Pt making good progress toward goals. Pt continues to benefit from skilled OT services to maximize return to PLOF and minimize risk of future falls, injury, caregiver burden, and readmission. Will continue to follow POC as written. Discharge recommendation updated to STR to reflect pt current goals of care and functional needs.     Follow Up Recommendations  SNF    Equipment Recommendations  Other (comment) (TBD)    Recommendations for Other Services      Precautions / Restrictions Precautions Precautions: Fall Restrictions Weight Bearing Restrictions: No Other  Position/Activity Restrictions: monitor O2 with activity       Mobility Bed Mobility Overal bed mobility: Needs Assistance             General bed mobility comments: Deferred. Pt in recliner at start/end of session.  Transfers                      Balance Overall balance assessment: Needs assistance                                         ADL either performed or assessed with clinical judgement   ADL                                         General ADL Comments: Pt/caregiver (wife at bedside t/o session) educated on energy conservation strategies to support safety and functional independence during ADL management.     Vision Baseline Vision/History: Wears glasses Wears Glasses: At all times Patient Visual Report: No change from baseline     Perception     Praxis      Cognition Arousal/Alertness: Awake/alert Behavior During Therapy: WFL for tasks assessed/performed Overall Cognitive Status: Within Functional Limits for tasks assessed                                 General Comments: Pt is pleasant and cooperative t/o session.  Wife at bedside states pt has had some cognitive decline over last several months and that pt is at current baseline.        Exercises Other Exercises Other Exercises: Pt/caregiver (wife) educated on energy conservation strategies to support pt safety and functional independence with ADL management including pursed lip breathing, safe use of AE/DME for ADL management, and activity pacing. Pt/caregiver provided with AE catalog.   Shoulder Instructions       General Comments Pt vitals monitored during session. HR in upper 80's at rest with spO2 of 90% at start of session. Pt on 3L [place] t/o. With cues for PLB spO2 improves to 93%.    Pertinent Vitals/ Pain       Pain Assessment: No/denies pain  Home Living                                          Prior  Functioning/Environment              Frequency  Min 1X/week        Progress Toward Goals  OT Goals(current goals can now be found in the care plan section)  Progress towards OT goals: Progressing toward goals  Acute Rehab OT Goals Patient Stated Goal: rehab then home OT Goal Formulation: With patient Time For Goal Achievement: 08/07/20 Potential to Achieve Goals: Good  Plan Discharge plan needs to be updated;Frequency remains appropriate    Co-evaluation                 AM-PAC OT "6 Clicks" Daily Activity     Outcome Measure   Help from another person eating meals?: A Little Help from another person taking care of personal grooming?: A Little Help from another person toileting, which includes using toliet, bedpan, or urinal?: A Lot Help from another person bathing (including washing, rinsing, drying)?: A Lot Help from another person to put on and taking off regular upper body clothing?: A Little Help from another person to put on and taking off regular lower body clothing?: A Lot 6 Click Score: 15    End of Session    OT Visit Diagnosis: Other abnormalities of gait and mobility (R26.89)   Activity Tolerance Patient tolerated treatment well   Patient Left in chair;with call bell/phone within reach;with chair alarm set;with family/visitor present   Nurse Communication          Time: 1829-9371 OT Time Calculation (min): 28 min  Charges: OT General Charges $OT Visit: 1 Visit OT Treatments $Self Care/Home Management : 23-37 mins  Rockney Ghee, M.S., OTR/L Ascom: [phone] 07/28/20, 4:35 PM

## 2020-07-28 NOTE — Progress Notes (Signed)
Progress Note  Patient Name: Victor Daniels Date of Encounter: 07/28/2020  [facility] Cardiologist: .Dr. Mariah Milling  Subjective   Patient sitting upright in bed, complains of shortness of breath. He stated to me with shortness of breath for some time now, acutely worsened this morning. Net -1.9 L over the past 24 hours, creatinine normal. Received breathing treatment this morning  Inpatient Medications    Scheduled Meds: . apixaban  5 mg Oral BID  . fluticasone furoate-vilanterol  1 puff Inhalation Daily   And  . umeclidinium bromide  1 puff Inhalation Daily  . furosemide  40 mg Intravenous BID  . ipratropium-albuterol  3 mL Nebulization Q6H  . iron polysaccharides  150 mg Oral Q breakfast  . losartan  25 mg Oral Daily  . multivitamin with minerals  1 tablet Oral Daily  . simvastatin  20 mg Oral QHS  . sodium chloride flush  3 mL Intravenous Q12H  . spironolactone  25 mg Oral Daily  . tamsulosin  0.4 mg Oral Daily  . vitamin B-12  1,000 mcg Oral Daily   Continuous Infusions: . sodium chloride     PRN Meds: sodium chloride, acetaminophen, albuterol, ALPRAZolam, dextromethorphan-guaiFENesin, hydrALAZINE, nitroGLYCERIN, ondansetron (ZOFRAN) IV, oxyCODONE, sodium chloride flush   Vital Signs    Vitals:   07/28/20 0138 07/28/20 0435 07/28/20 0755 07/28/20 0819  BP:  128/80 (!) 144/82   Pulse:  60 60   Resp:  16 18   Temp:  (!) 97.4 F (36.3 C) (!) 97.3 F (36.3 C)   TempSrc:   Oral   SpO2: 95% 99% 97% 95%  Weight:  130.4 kg    Height:        Intake/Output Summary (Last 24 hours) at 07/28/2020 0936 Last data filed at 07/28/2020 0854 Gross per 24 hour  Intake 2760 ml  Output 3350 ml  Net -590 ml   Last 3 Weights 07/28/2020 07/27/2020 07/26/2020  Weight (lbs) 287 lb 6.4 oz 286 lb 14.4 oz 297 lb 6.4 oz  Weight (kg) 130.364 kg 130.137 kg 134.9 kg      Telemetry    A. fib heart rate 92- Personally Reviewed  ECG    No new tracing- Personally  Reviewed  Physical Exam   GEN: Mild respiratory distress Neck: No JVD Cardiac:  Irregular irregular, Respiratory: Clear anteriorly, decreased breath sounds at bases, no crackles noted GI: Soft, nontender, distended  MS:  Trace edema; No deformity. Neuro:  Nonfocal  Psych: Normal affect   Labs    High Sensitivity Troponin:  No results for input(s): TROPONINIHS in the last 720 hours.    Chemistry Recent Labs  Lab 07/23/20 0749 07/24/20 0343 07/26/20 0423 07/27/20 0608 07/28/20 0552  NA 141   < > 141 142 143  K 3.8   < > 4.0 3.1* 3.6  CL 94*   < > 89* 86* 88*  CO2 39*   < > 44* 48* 49*  GLUCOSE 161*   < > 161* 114* 101*  BUN 16   < > 40* 39* 34*  CREATININE 1.28*   < > 1.19 1.25* 0.93  CALCIUM 9.0   < > 8.9 8.9 8.8*  PROT 7.8  --   --   --   --   ALBUMIN 3.3*  --   --   --   --   AST 16  --   --   --   --   ALT 12  --   --   --   --  ALKPHOS 86  --   --   --   --   BILITOT 1.5*  --   --   --   --   GFRNONAA 55*   < > >60 57* >60  GFRAA >60   < > >60 >60 >60  ANIONGAP 8   < > 8 8 6    < > = values in this interval not displayed.     Hematology Recent Labs  Lab 07/25/20 0418 07/27/20 0608 07/28/20 0552  WBC 11.7* 11.7* 8.1  RBC 3.51* 3.65* 3.65*  HGB 9.7* 10.4* 10.0*  HCT 32.8* 34.1* 34.8*  MCV 93.4 93.4 95.3  MCH 27.6 28.5 27.4  MCHC 29.6* 30.5 28.7*  RDW 15.9* 15.6* 15.6*  PLT 159 190 165    BNP Recent Labs  Lab 07/23/20 0749  BNP 567.0*     DDimer No results for input(s): DDIMER in the last 168 hours.   Radiology    No results found.  Cardiac Studies   Echocardiogram (06/22/2020): 1. Left ventricular ejection fraction, by estimation, is >55%. The left  ventricle has normal function. Left ventricular endocardial border not  optimally defined to evaluate regional wall motion. Left ventricular  diastolic parameters are indeterminate.  2. Right ventricular systolic function was not well visualized. The right  ventricular size is not well  visualized.  3. The mitral valve was not well visualized. Unable to assess mitral  valve regurgitation.  4. Tricuspid valve regurgitation not well visualized.  5. The aortic valve was not well visualized. Aortic valve regurgitation  not well assessed. No aortic stenosis is present.  6. Pulmonic valve regurgitation not well visualized.  7. The inferior vena cava is dilated in size with <50% respiratory  variability, suggesting right atrial pressure of 15 mmHg.   Patient Profile     74 y.o. male with history of CAD, heart failure preserved ejection fraction, last EF 55%, hypertension, hyperlipidemia, OSA, COPD being seen for A. fib and HFpEF  Assessment & Plan    1. HFpEF -Patient acutely dyspneic. Fluid status difficult to assess, trace edema on exam. -Net -1.9 L over the past 24 hours, creatinine normal. -Unsure if current dyspnea is secondary to cardiac versus pulmonary. -Increase Lasix to 40 mg twice daily. -May need right heart cath to adequately assess volume status. Continue breathing treatments, diuresis possible. Monitor creatinine closely with diuresing.  2. Persistent A. Fib -Rate is controlled off nodal agents -Continue Eliquis.  3. Hypertension -BP controlled -cont Losartan, Aldactone.   Total encounter time 35 minutes  Greater than 50% was spent in counseling and coordination of care with the patient     Signed, 65, MD  07/28/2020, 9:36 AM

## 2020-07-28 NOTE — Progress Notes (Signed)
Physical Therapy Treatment Patient Details Name: Victor Daniels MRN: [id number] DOB: 05-20-1946 Today's Date: 07/28/2020    History of Present Illness Per MD Note:Victor Daniels is a 74 y.o. male with medical history significant of hypertension, hyperlipidemia COPD, on 2 L oxygen, stroke, PAD, OSA, CAD, DVT atrial fibrillation on Eliquis, dCHF, alcoholic gastritis, anemia, who presents with shortness breath.    PT Comments    Pt was long sitting in bed upon arriving. He agrees to PT session with encouragement. Pt endorses more fatigue and states he has just started taking sleeping medication. He was on 3 L o2 [place] upon arriving however desaturates to 84% with activity. Increased to 4 L o2 during session and was able to maintain > 88%. Pt is most limited by fatigue/ incontinents of urine. He was SOB with only standing ~ 2 minutes EOB. Pt declined further ambulation them from bed to recliner. Pt was repositioned in recliner with pillow support, call bell in reach, and pt returned to 3 L o2 with sao2 94%. Pt will benefit form SNF at DC to address endurance, strength, balance, and safe functional mobility deficits. Recommend SNF at DC to assist pt to PLOF. Acute PT will continue to follow per POC.    Follow Up Recommendations  SNF     Equipment Recommendations  Rolling walker with 5" wheels    Recommendations for Other Services       Precautions / Restrictions Precautions Precautions: Fall Restrictions Weight Bearing Restrictions: No    Mobility  Bed Mobility Overal bed mobility: Needs Assistance Bed Mobility: Supine to Sit;Sit to Supine     Supine to sit: HOB elevated;Min guard     General bed mobility comments: required CGA for safety. increased difficulty from previous date.  Transfers Overall transfer level: Needs assistance Equipment used: Rolling walker (2 wheeled) Transfers: Sit to/from Stand Sit to Stand: Min assist         General transfer comment:  MIn assist to STS at EOB. stood ~ 2 minutes at EOB prior to reportsing fatigue and requesting to return to sitting. Pt very limited by fatigue. Pt desaturates to 84% on 3 L. increased to 4 L throughout session and return to 3 L at conclusion once seated in recliner.  Ambulation/Gait Ambulation/Gait assistance: Min guard Gait Distance (Feet): 8 Feet Assistive device: Rolling walker (2 wheeled) Gait Pattern/deviations: Step-through pattern;Trunk flexed Gait velocity: decreased   General Gait Details: Pt fatigues quickly with standing activity. no LOB or unsteadiness with BUe support. pt is limited by endurance/SOB/HR   Stairs             Wheelchair Mobility    Modified Rankin (Stroke Patients Only)       Balance Overall balance assessment: Needs assistance Sitting-balance support: Bilateral upper extremity supported;Feet supported Sitting balance-Leahy Scale: Good Sitting balance - Comments: no LOB in sitting   Standing balance support: Bilateral upper extremity supported Standing balance-Leahy Scale: Fair Standing balance comment: heavy reliance on BUE support for maintaining balance                            Cognition Arousal/Alertness: Awake/alert Behavior During Therapy: WFL for tasks assessed/performed Overall Cognitive Status: Within Functional Limits for tasks assessed                                 General Comments: Pt is A and O  x 4 and very cooperative and pleasant. He requires encouragement for OOB and refused gait/ambulation out of room.      Exercises      General Comments        Pertinent Vitals/Pain Pain Assessment: No/denies pain    Home Living                      Prior Function            PT Goals (current goals can now be found in the care plan section) Acute Rehab PT Goals Patient Stated Goal: rehab then home Progress towards PT goals: Not progressing toward goals - comment (fatigue/SOB  limiting)    Frequency    Min 2X/week      PT Plan Current plan remains appropriate    Co-evaluation              AM-PAC PT "6 Clicks" Mobility   Outcome Measure  Help needed turning from your back to your side while in a flat bed without using bedrails?: A Little Help needed moving from lying on your back to sitting on the side of a flat bed without using bedrails?: A Little Help needed moving to and from a bed to a chair (including a wheelchair)?: A Lot Help needed standing up from a chair using your arms (e.g., wheelchair or bedside chair)?: A Little Help needed to walk in hospital room?: A Little Help needed climbing 3-5 steps with a railing? : A Little 6 Click Score: 17    End of Session Equipment Utilized During Treatment: Gait belt;Oxygen Activity Tolerance: Patient limited by fatigue Patient left: in chair;with call bell/phone within reach;with chair alarm set;with family/visitor present Nurse Communication: Mobility status PT Visit Diagnosis: Unsteadiness on feet (R26.81);Muscle weakness (generalized) (M62.81);Difficulty in walking, not elsewhere classified (R26.2);Pain     Time: 3845-3646 PT Time Calculation (min) (ACUTE ONLY): 24 min  Charges:  $Therapeutic Activity: 23-37 mins                     Jetta Lout PTA 07/28/20, 2:12 PM

## 2020-07-29 ENCOUNTER — Telehealth: Payer: Self-pay | Admitting: Internal Medicine

## 2020-07-29 DIAGNOSIS — R0789 Other chest pain: Secondary | ICD-10-CM | POA: Diagnosis not present

## 2020-07-29 DIAGNOSIS — J811 Chronic pulmonary edema: Secondary | ICD-10-CM | POA: Diagnosis not present

## 2020-07-29 DIAGNOSIS — E785 Hyperlipidemia, unspecified: Secondary | ICD-10-CM | POA: Diagnosis not present

## 2020-07-29 DIAGNOSIS — Z79899 Other long term (current) drug therapy: Secondary | ICD-10-CM | POA: Diagnosis not present

## 2020-07-29 DIAGNOSIS — J449 Chronic obstructive pulmonary disease, unspecified: Secondary | ICD-10-CM | POA: Diagnosis not present

## 2020-07-29 DIAGNOSIS — I5033 Acute on chronic diastolic (congestive) heart failure: Secondary | ICD-10-CM | POA: Diagnosis not present

## 2020-07-29 DIAGNOSIS — J9621 Acute and chronic respiratory failure with hypoxia: Secondary | ICD-10-CM | POA: Diagnosis not present

## 2020-07-29 DIAGNOSIS — I959 Hypotension, unspecified: Secondary | ICD-10-CM | POA: Diagnosis not present

## 2020-07-29 DIAGNOSIS — M4722 Other spondylosis with radiculopathy, cervical region: Secondary | ICD-10-CM | POA: Diagnosis not present

## 2020-07-29 DIAGNOSIS — Z7401 Bed confinement status: Secondary | ICD-10-CM | POA: Diagnosis not present

## 2020-07-29 DIAGNOSIS — I4819 Other persistent atrial fibrillation: Secondary | ICD-10-CM | POA: Diagnosis not present

## 2020-07-29 DIAGNOSIS — M2550 Pain in unspecified joint: Secondary | ICD-10-CM | POA: Diagnosis not present

## 2020-07-29 DIAGNOSIS — Z7952 Long term (current) use of systemic steroids: Secondary | ICD-10-CM | POA: Diagnosis not present

## 2020-07-29 DIAGNOSIS — R0689 Other abnormalities of breathing: Secondary | ICD-10-CM | POA: Diagnosis not present

## 2020-07-29 DIAGNOSIS — Z87891 Personal history of nicotine dependence: Secondary | ICD-10-CM | POA: Diagnosis not present

## 2020-07-29 DIAGNOSIS — J90 Pleural effusion, not elsewhere classified: Secondary | ICD-10-CM | POA: Diagnosis not present

## 2020-07-29 DIAGNOSIS — J9622 Acute and chronic respiratory failure with hypercapnia: Secondary | ICD-10-CM | POA: Diagnosis not present

## 2020-07-29 DIAGNOSIS — Z96649 Presence of unspecified artificial hip joint: Secondary | ICD-10-CM | POA: Diagnosis not present

## 2020-07-29 DIAGNOSIS — Z8679 Personal history of other diseases of the circulatory system: Secondary | ICD-10-CM | POA: Diagnosis not present

## 2020-07-29 DIAGNOSIS — Z7951 Long term (current) use of inhaled steroids: Secondary | ICD-10-CM | POA: Diagnosis not present

## 2020-07-29 DIAGNOSIS — I517 Cardiomegaly: Secondary | ICD-10-CM | POA: Diagnosis not present

## 2020-07-29 DIAGNOSIS — J9600 Acute respiratory failure, unspecified whether with hypoxia or hypercapnia: Secondary | ICD-10-CM | POA: Diagnosis not present

## 2020-07-29 DIAGNOSIS — Z7901 Long term (current) use of anticoagulants: Secondary | ICD-10-CM | POA: Diagnosis not present

## 2020-07-29 DIAGNOSIS — I2510 Atherosclerotic heart disease of native coronary artery without angina pectoris: Secondary | ICD-10-CM | POA: Diagnosis not present

## 2020-07-29 DIAGNOSIS — I4891 Unspecified atrial fibrillation: Secondary | ICD-10-CM | POA: Diagnosis not present

## 2020-07-29 DIAGNOSIS — Z8673 Personal history of transient ischemic attack (TIA), and cerebral infarction without residual deficits: Secondary | ICD-10-CM | POA: Diagnosis not present

## 2020-07-29 DIAGNOSIS — Z86718 Personal history of other venous thrombosis and embolism: Secondary | ICD-10-CM | POA: Diagnosis not present

## 2020-07-29 DIAGNOSIS — R0902 Hypoxemia: Secondary | ICD-10-CM | POA: Diagnosis not present

## 2020-07-29 DIAGNOSIS — I509 Heart failure, unspecified: Secondary | ICD-10-CM | POA: Diagnosis not present

## 2020-07-29 DIAGNOSIS — I130 Hypertensive heart and chronic kidney disease with heart failure and stage 1 through stage 4 chronic kidney disease, or unspecified chronic kidney disease: Secondary | ICD-10-CM | POA: Diagnosis not present

## 2020-07-29 DIAGNOSIS — N1831 Chronic kidney disease, stage 3a: Secondary | ICD-10-CM | POA: Diagnosis not present

## 2020-07-29 DIAGNOSIS — R079 Chest pain, unspecified: Secondary | ICD-10-CM | POA: Diagnosis not present

## 2020-07-29 DIAGNOSIS — R0602 Shortness of breath: Secondary | ICD-10-CM | POA: Diagnosis not present

## 2020-07-29 DIAGNOSIS — J441 Chronic obstructive pulmonary disease with (acute) exacerbation: Secondary | ICD-10-CM | POA: Diagnosis not present

## 2020-07-29 DIAGNOSIS — Z20822 Contact with and (suspected) exposure to covid-19: Secondary | ICD-10-CM | POA: Diagnosis not present

## 2020-07-29 DIAGNOSIS — J40 Bronchitis, not specified as acute or chronic: Secondary | ICD-10-CM | POA: Diagnosis not present

## 2020-07-29 DIAGNOSIS — I10 Essential (primary) hypertension: Secondary | ICD-10-CM | POA: Diagnosis not present

## 2020-07-29 DIAGNOSIS — N179 Acute kidney failure, unspecified: Secondary | ICD-10-CM | POA: Diagnosis not present

## 2020-07-29 DIAGNOSIS — E1122 Type 2 diabetes mellitus with diabetic chronic kidney disease: Secondary | ICD-10-CM | POA: Diagnosis not present

## 2020-07-29 DIAGNOSIS — G8929 Other chronic pain: Secondary | ICD-10-CM | POA: Diagnosis not present

## 2020-07-29 LAB — BASIC METABOLIC PANEL
Anion gap: 6 (ref 5–15)
BUN: 31 mg/dL — ABNORMAL HIGH (ref 8–23)
CO2: 49 mmol/L — ABNORMAL HIGH (ref 22–32)
Calcium: 9 mg/dL (ref 8.9–10.3)
Chloride: 87 mmol/L — ABNORMAL LOW (ref 98–111)
Creatinine, Ser: 0.95 mg/dL (ref 0.61–1.24)
GFR calc Af Amer: >60 mL/min (ref >60)
GFR calc non Af Amer: >60 mL/min (ref >60)
Glucose, Bld: 104 mg/dL — ABNORMAL HIGH (ref 70–99)
Potassium: 3.7 mmol/L (ref 3.5–5.1)
Sodium: 142 mmol/L (ref 135–145)

## 2020-07-29 MED ORDER — FUROSEMIDE 40 MG PO TABS: 40.0000 mg | ORAL_TABLET | Freq: Two times a day (BID) | ORAL | 0 refills | Status: DC

## 2020-07-29 MED ORDER — ALBUTEROL SULFATE (2.5 MG/3ML) 0.083% IN NEBU: 2.5000 mg | INHALATION_SOLUTION | Freq: Four times a day (QID) | RESPIRATORY_TRACT | 0 refills | Status: AC | PRN

## 2020-07-29 MED ORDER — POTASSIUM CHLORIDE CRYS ER 20 MEQ PO TBCR
40.0000 meq | EXTENDED_RELEASE_TABLET | Freq: Once | ORAL | Status: AC
  Administered 2020-07-29: 40 meq via ORAL
  Filled 2020-07-29: qty 2

## 2020-07-29 MED ORDER — SPIRONOLACTONE 25 MG PO TABS: 25.0000 mg | ORAL_TABLET | Freq: Every day | ORAL | 0 refills | Status: DC

## 2020-07-29 MED ORDER — OXYCODONE HCL 15 MG PO TABS: 15.0000 mg | ORAL_TABLET | Freq: Four times a day (QID) | ORAL | 0 refills | Status: DC | PRN

## 2020-07-29 MED ORDER — MENTHOL 3 MG MT LOZG
1.0000 | LOZENGE | OROMUCOSAL | Status: DC | PRN
  Filled 2020-07-29: qty 9

## 2020-07-29 MED ORDER — MENTHOL 3 MG MT LOZG: 1.0000 | LOZENGE | OROMUCOSAL | 0 refills | Status: DC | PRN

## 2020-07-29 MED ORDER — FUROSEMIDE 40 MG PO TABS: 40.0000 mg | ORAL_TABLET | Freq: Two times a day (BID) | ORAL | Status: DC

## 2020-07-29 NOTE — Progress Notes (Signed)
Patient discharged to [facility] fro rehab. Tele and IV d/c'd. Report given to EMS and [facility].  Patient without complaints prior to discharge. Belongings sent with patient

## 2020-07-29 NOTE — Progress Notes (Signed)
Mobility Specialist - Progress Note   07/29/20 1223  Mobility  Activity Transferred:  Bed to chair  Level of Assistance Contact guard assist, steadying assist  Assistive Device Front wheel walker  Distance Ambulated (ft) 5 ft  Mobility Response Tolerated well  Mobility performed by Mobility specialist  $Mobility charge 1 Mobility    Pre-mobility: 78 HR, 139/72 BP, 97% SpO2 During mobility: 99 HR, 90% SpO2 Post-mobility: 73 HR, 146/74 BP, 94% SpO2   Pt was in bed upon arrival. Pt agreed to session. Pt got to EOB w/ supervision. Pt needed CGA for sit-to-stand, and transferred 5' over to recliner w/ CGA using RW. Pt tolerated session well. O2 desat to 90% right after transfer. O2 increased up to 94% after sitting down for a few seconds. Pt left in recliner w/ alarm set and phone/ call bell in reach. Nurse was notified.    Millenia Waldvogel Mobility Specialist  07/29/20, 12:29 PM

## 2020-07-29 NOTE — Care Management Important Message (Signed)
Important Message  Patient Details  Name: Victor Daniels MRN: [id number] Date of Birth: 02-16-1946   Medicare Important Message Given:  Yes     Olegario Messier A [name] 07/29/2020, 11:29 AM

## 2020-07-29 NOTE — Telephone Encounter (Signed)
Pt called wanted Dr.Tullo to get him out of [facility]

## 2020-07-29 NOTE — Progress Notes (Addendum)
Progress Note  Patient Name: Victor Daniels Date of Encounter: 07/29/2020  Primary Cardiologist:Dr. Mariah Milling  Subjective   States breathing improved when compared with yesterday.  No CP, racing HR, palpitations.  Pending renal function from this AM. Patient aware that if his renal function has worsened, we may need to consider RHC. Most recent labs show improvement in renal function, which is reassuring. Will defer from now. Per IM MD, he has declined additional home breathing aids and at high risk for readmission.   Inpatient Medications    Scheduled Meds: . apixaban  5 mg Oral BID  . fluticasone furoate-vilanterol  1 puff Inhalation Daily   And  . umeclidinium bromide  1 puff Inhalation Daily  . furosemide  40 mg Intravenous BID  . ipratropium-albuterol  3 mL Nebulization Q6H  . iron polysaccharides  150 mg Oral Q breakfast  . losartan  25 mg Oral Daily  . multivitamin with minerals  1 tablet Oral Daily  . simvastatin  20 mg Oral QHS  . sodium chloride flush  3 mL Intravenous Q12H  . spironolactone  25 mg Oral Daily  . tamsulosin  0.4 mg Oral Daily  . vitamin B-12  1,000 mcg Oral Daily   Continuous Infusions: . sodium chloride     PRN Meds: sodium chloride, acetaminophen, albuterol, ALPRAZolam, dextromethorphan-guaiFENesin, hydrALAZINE, nitroGLYCERIN, ondansetron (ZOFRAN) IV, oxyCODONE, sodium chloride flush   Vital Signs    Vitals:   07/29/20 0147 07/29/20 0500 07/29/20 0501 07/29/20 0757  BP:   (!) 149/84 132/62  Pulse:   64 (!) 58  Resp:   16 17  Temp:   98.3 F (36.8 C) 97.6 F (36.4 C)  TempSrc:   Oral   SpO2: 96%  98% 99%  Weight:  128 kg    Height:        Intake/Output Summary (Last 24 hours) at 07/29/2020 0812 Last data filed at 07/29/2020 0504 Gross per 24 hour  Intake 963 ml  Output 3050 ml  Net -2087 ml   Last 3 Weights 07/29/2020 07/28/2020 07/27/2020  Weight (lbs) 282 lb 3.2 oz 287 lb 6.4 oz 286 lb 14.4 oz  Weight (kg) 128.005 kg 130.364 kg  130.137 kg      Telemetry    Afib with slow ventricular response, rates 45-90s  - Personally Reviewed  ECG    No new tracings - Personally Reviewed  Physical Exam   GEN: No acute distress.  Lying in bed with head of bed 90 degrees  Neck: JVD difficult to assess 2/2 body habitus Cardiac: IRIR with slow ventricular rate, no murmurs, rubs, or gallops.  Respiratory: Scattered wheezing and rhonchi. Bibasilar crackles. R > L reduced basilar breath sounds GI: Soft, nontender, non-distended  MS: bilateral woody edema with lymphedema skin changes; No deformity. Neuro:  Nonfocal  Psych: Normal affect   Labs    High Sensitivity Troponin:  No results for input(s): TROPONINIHS in the last 720 hours.    Chemistry Recent Labs  Lab 07/23/20 0749 07/24/20 0343 07/26/20 0423 07/27/20 0608 07/28/20 0552  NA 141   < > 141 142 143  K 3.8   < > 4.0 3.1* 3.6  CL 94*   < > 89* 86* 88*  CO2 39*   < > 44* 48* 49*  GLUCOSE 161*   < > 161* 114* 101*  BUN 16   < > 40* 39* 34*  CREATININE 1.28*   < > 1.19 1.25* 0.93  CALCIUM 9.0   < >  8.9 8.9 8.8*  PROT 7.8  --   --   --   --   ALBUMIN 3.3*  --   --   --   --   AST 16  --   --   --   --   ALT 12  --   --   --   --   ALKPHOS 86  --   --   --   --   BILITOT 1.5*  --   --   --   --   GFRNONAA 55*   < > >60 57* >60  GFRAA >60   < > >60 >60 >60  ANIONGAP 8   < > 8 8 6    < > = values in this interval not displayed.     Hematology Recent Labs  Lab 07/25/20 0418 07/27/20 0608 07/28/20 0552  WBC 11.7* 11.7* 8.1  RBC 3.51* 3.65* 3.65*  HGB 9.7* 10.4* 10.0*  HCT 32.8* 34.1* 34.8*  MCV 93.4 93.4 95.3  MCH 27.6 28.5 27.4  MCHC 29.6* 30.5 28.7*  RDW 15.9* 15.6* 15.6*  PLT 159 190 165    BNP Recent Labs  Lab 07/23/20 0749  BNP 567.0*     DDimer No results for input(s): DDIMER in the last 168 hours.   Radiology    No results found.  Cardiac Studies   Echo 05/2020 1. Left ventricular ejection fraction, by estimation, is >55%.  The left  ventricle has normal function. Left ventricular endocardial border not  optimally defined to evaluate regional wall motion. Left ventricular  diastolic parameters are indeterminate.  2. Right ventricular systolic function was not well visualized. The right  ventricular size is not well visualized.  3. The mitral valve was not well visualized. Unable to assess mitral  valve regurgitation.  4. Tricuspid valve regurgitation not well visualized.  5. The aortic valve was not well visualized. Aortic valve regurgitation  not well assessed. No aortic stenosis is present.  6. Pulmonic valve regurgitation not well visualized.  7. The inferior vena cava is dilated in size with <50% respiratory  variability, suggesting right atrial pressure of 15 mmHg.   Patient Profile     74 y.o. male with history of CAD, ICM, DM 2, hypertension, hyperlipidemia, sleep apnea, prior CVA, and who is being seen today for acute on chronic diastolic congestive heart failure and atrial fibrillation.  Assessment & Plan    Acute on chronic diastolic congestive heart failure --Improved shortness of breath.  Uses home oxygen at 2-3L.   --Echo as above with normal LVSF. Elevated RAP. Not clear images. --Remains volume overloaded on exam. --Volume status likely exacerbated by Afib. --Continue to monitor I's/O's, daily standing weights. --Net -3L yesterday. Wt 130  128kg.  --Continue IV diuresis as still volume up. --Continue Losartan, Spironolactone as renal function allows. --Daily BMET.  Replete electrolytes to goal. --RHC has been considered if continued worsening renal function, given increased diuresis worsened renal function earlier in admission. Most recent renal function improved. Pending AM labs. Further recommendations pending BMET. CHF education.  Low-salt diet and fluid restriction.  Acute on chronic stage II kidney disease Solitary kidney --Daily BMET. Close monitoring given solitary  kidney. --See above.  --Current renal function and electrolytes improved from previous labs.   Hypokalemia --Replete with goal 4.0.  Persistent atrial fibrillation with slow ventricular rate --Carvedilol discontinued given slow ventricular rate. --Asx with bradycardic rate. Denies dizziness. --TSH 2.088. Electrolytes wnl.  --Echo above with normal LVSF.Elevated RAP. --  Compliant with anticoagulation for at least 1 month and therapeutic. CHA2DS2VASc score of at least 7 (CHF, HTN, age, DM, strokex2, vascular). --Given weight and untreated sleep apnea, reduced change of holding NSR with cardioversion. Could consider antiarrhythmic medication.    COPD --Per IM. Likely contributing to sx. Continue oxygen.  HTN --BP relatively well controlled. Closely monitor. --Coreg has been held secondary to bradycardia/slow ventricular rate.   --Not on amlodipine due to lower extremity edema.   --Continue hydralazine, diuresis, spironolactone, and losartan.   --If worsening renal function, recommend holding losartan or spironolactone.  HLD --Continue current statin.  CAD --No chest pain.   --Continue apixaban in lieu of ASA.  History of untreated sleep apnea --CPAP recommended.  Alcohol use --Recommend continue to abstain.   For questions or updates, please contact [facility] Please consult [email or web address] for contact info under        Signed, Lennon Alstrom, PA-C  07/29/2020, 8:12 AM

## 2020-07-29 NOTE — TOC Transition Note (Signed)
Transition of Care [facility]) - CM/SW Discharge Note   Patient Details  Name: Victor Daniels MRN: [id number] Date of Birth: 15-Jun-1946  Transition of Care [facility]) CM/SW Contact:  Maree Krabbe, LCSW Phone Number: 07/29/2020, 11:24 AM   Clinical Narrative:   Clinical Social Worker facilitated patient discharge including contacting patient family and facility to confirm patient discharge plans.  Clinical information faxed to facility and family agreeable with plan.  CSW arranged ambulance transport via [facility] to [facility].  RN to call [phone] for report prior to discharge.    Final next level of care: Skilled Nursing Facility Barriers to Discharge: No Barriers Identified   Patient Goals and CMS Choice        Discharge Placement                Patient to be transferred to facility by: [facility] Name of family member notified: [facility] Patient and family notified of of transfer: 07/29/20  Discharge Plan and Services                                     Social Determinants of Health (SDOH) Interventions     Readmission Risk Interventions No flowsheet data found.

## 2020-07-29 NOTE — Discharge Instructions (Signed)

## 2020-07-29 NOTE — Discharge Summary (Signed)
[facility] [place] [facility]   PATIENT NAME: Victor Daniels    MR#:  [id number]  DATE OF BIRTH:  [birth year]  DATE OF ADMISSION:  07/23/2020 ADMITTING PHYSICIAN: Lorretta Harp, MD  DATE OF DISCHARGE: 07/29/2020  PRIMARY CARE PHYSICIAN: [name], MD    ADMISSION DIAGNOSIS:  Dyspnea [R06.00] SOB (shortness of breath) [R06.02] Hypoxia [R09.02] Acute on chronic diastolic CHF (congestive heart failure) (HCC) [I50.33] Acute on chronic respiratory failure with hypoxia (HCC) [J96.21] Acute on chronic congestive heart failure, unspecified heart failure type (HCC) [I50.9]  DISCHARGE DIAGNOSIS:  Principal Problem:   Acute on chronic respiratory failure with hypoxia and hypercapnia (HCC) Active Problems:   Hyperlipidemia   Hypertension   CAD (coronary artery disease)   COPD with acute exacerbation (HCC)   Iron deficiency anemia   CKD (chronic kidney disease), stage IIIa   Acute on chronic diastolic CHF (congestive heart failure) (HCC)   Atrial fibrillation (HCC)   DVT (deep venous thrombosis) (HCC)   Stroke (HCC)   COPD with acute bronchitis (HCC)   SECONDARY DIAGNOSIS:   Past Medical History:  Diagnosis Date  . Alcoholic gastritis   . Arthritis   . CAD (coronary artery disease)   . CHF (congestive heart failure) (HCC)    ischemic CM.  EF 25%  . COPD (chronic obstructive pulmonary disease) (HCC)   . CVA (cerebral infarction)    residual short term memory loss  . Diabetes mellitus without complication (HCC)    diet controlled  . DVT (deep venous thrombosis) (HCC)   . Heart attack (HCC) 11/16/09  . History of alcohol abuse 2005   now abstinent for years  . Hyperlipidemia   . Hypertension   . On home oxygen therapy    2 liters continuously  . OSA (obstructive sleep apnea)    not on CPAP  . PAD (peripheral artery disease) (HCC)   . Pneumonia    frequent in the past  . Stroke [facility]) 2010  . Venous insufficiency of leg     HOSPITAL  COURSE:   1.  Acute on chronic diastolic congestive heart failure (HFpEF).  Cardiology increase the Lasix yesterday to 40 mg IV twice a day.  Today cardiology did change over to 40 mg orally twice a day.  Cardiology stated stable for disposition home.  No beta-blocker secondary to bradycardia.  Cardiology did add spironolactone. 2.  Acute on chronic respiratory failure with hypoxia and hypercarbia.  The patient is a candidate for noninvasive ventilation but does not want to use it.  I offered it again for him today but again he did not want to use it.  He will be high risk for readmissions.  I had rather have him more hypoxic.  Continue oxygen 2 L.  Patient at higher risk for CO2 retention if you increase his oxygen. 3.  Persistent atrial fibrillation on Eliquis for anticoagulation.  No beta-blocker secondary to bradycardia. 4.  COPD exacerbation.  Patient finished Z-Pak.  Can go back on Trelegy inhaler. 5.  Chronic kidney disease stage IIIa with solitary kidney.  Watch with diuresis recommend checking a BMP and 5 to 7 days. 6.  Essential hypertension on losartan and spironolactone 7.  Hyperlipidemia unspecified on Zocor 8.  Phlegm caught in his throat.  We will give a Cepacol throat lozenge.  Can use albuterol nebulizer 9.  Left leg covered with pressure dressing.  Clean with normal saline, pat gentle dry.  Cover with folded piece of Xeroform gauze, top  with dry gauze and secure with Kerlix roll.  Top with Ace bandage.  Change every 2 to 3 days.  DISCHARGE CONDITIONS:   Fair  CONSULTS OBTAINED:  Treatment Team:  Antonieta IbaGollan, Timothy J, MD  DRUG ALLERGIES:  No Known Allergies  DISCHARGE MEDICATIONS:   Allergies as of 07/29/2020   No Known Allergies     Medication List    STOP taking these medications   carvedilol 6.25 MG tablet Commonly known as: COREG   hydrocerin Crea   potassium chloride SA 20 MEQ tablet Commonly known as: KLOR-CON     TAKE these medications   apixaban 5 MG Tabs  tablet Commonly known as: ELIQUIS Take 1 tablet (5 mg total) by mouth 2 (two) times daily.   furosemide 40 MG tablet Commonly known as: LASIX Take 1 tablet (40 mg total) by mouth 2 (two) times daily. What changed: when to take this   losartan 25 MG tablet Commonly known as: COZAAR Take 1 tablet (25 mg total) by mouth daily.   menthol-cetylpyridinium 3 MG lozenge Commonly known as: CEPACOL Take 1 lozenge (3 mg total) by mouth every 2 (two) hours as needed for sore throat.   multivitamin capsule Take 1 capsule by mouth daily.   nitroGLYCERIN 0.4 MG SL tablet Commonly known as: NITROSTAT Place 1 tablet (0.4 mg total) under the tongue every 5 (five) minutes as needed for chest pain. Maximum dose 3 tablets   oxyCODONE 15 MG immediate release tablet Commonly known as: ROXICODONE Take 1 tablet (15 mg total) by mouth every 6 (six) hours as needed.   OXYGEN Inhale 2 L into the lungs 3 (three) times daily as needed (shortness of breath).   simvastatin 20 MG tablet Commonly known as: ZOCOR Take 1 tablet (20 mg total) by mouth at bedtime.   spironolactone 25 MG tablet Commonly known as: ALDACTONE Take 1 tablet (25 mg total) by mouth daily. Start taking on: July 30, 2020   tamsulosin 0.4 MG Caps capsule Commonly known as: FLOMAX Take 1 capsule (0.4 mg total) by mouth daily.   Tandem 162-115.2 MG Caps capsule Generic drug: ferrous fumarate-iron polysaccharide complex Take 1 capsule by mouth daily with breakfast.   Trelegy Ellipta 100-62.5-25 MCG/INH Aepb Generic drug: Fluticasone-Umeclidin-Vilant Inhale 1 puff into the lungs daily.   Ventolin HFA 108 (90 Base) MCG/ACT inhaler Generic drug: albuterol INHALE 2 PUFFS EVERY 6 HOURS AS NEEDED FOR WHEEZING OR SHORTNESS OF BREATH What changed: Another medication with the same name was added. Make sure you understand how and when to take each.   albuterol (2.5 MG/3ML) 0.083% nebulizer solution Commonly known as: PROVENTIL Take 3  mLs (2.5 mg total) by nebulization every 6 (six) hours as needed for wheezing or shortness of breath. What changed: You were already taking a medication with the same name, and this prescription was added. Make sure you understand how and when to take each.   vitamin B-12 1000 MCG tablet Commonly known as: CYANOCOBALAMIN Take 1,000 mcg by mouth daily.        DISCHARGE INSTRUCTIONS:   Follow-up team at rehab 1 day Follow-up cardiology  If you experience worsening of your admission symptoms, develop shortness of breath, life threatening emergency, suicidal or homicidal thoughts you must seek medical attention immediately by calling 911 or calling your MD immediately  if symptoms less severe.  You Must read complete instructions/literature along with all the possible adverse reactions/side effects for all the Medicines you take and that have been prescribed to you.  Take any new Medicines after you have completely understood and accept all the possible adverse reactions/side effects.   Please note  You were cared for by a hospitalist during your hospital stay. If you have any questions about your discharge medications or the care you received while you were in the hospital after you are discharged, you can call the unit and asked to speak with the hospitalist on call if the hospitalist that took care of you is not available. Once you are discharged, your primary care physician will handle any further medical issues. Please note that NO REFILLS for any discharge medications will be authorized once you are discharged, as it is imperative that you return to your primary care physician (or establish a relationship with a primary care physician if you do not have one) for your aftercare needs so that they can reassess your need for medications and monitor your lab values.    Today   CHIEF COMPLAINT:   Chief Complaint  Patient presents with  . Shortness of Breath    HISTORY OF PRESENT  ILLNESS:  Victor Daniels  is a 74 y.o. male came in with shortness of breath   VITAL SIGNS:  Blood pressure 132/62, pulse (!) 58, temperature 97.6 F (36.4 C), resp. rate 17, height 6' (1.829 m), weight 128 kg, SpO2 99 %.  I/O:    Intake/Output Summary (Last 24 hours) at 07/29/2020 1011 Last data filed at 07/29/2020 0918 Gross per 24 hour  Intake 603 ml  Output 3150 ml  Net -2547 ml    PHYSICAL EXAMINATION:  GENERAL:  74 y.o.-year-old patient lying in the bed with no acute distress.  EYES: Pupils equal, round, reactive to light and accommodation. No scleral icterus. Extraocular muscles intact.  HEENT: Head atraumatic, normocephalic. Oropharynx and nasopharynx clear.   LUNGS: Decreased breath sounds bilaterally, no wheezing, rales,rhonchi or crepitation. No use of accessory muscles of respiration.  CARDIOVASCULAR: S1, S2 normal. No murmurs, rubs, or gallops.  ABDOMEN: Soft, non-tender, non-distended.  EXTREMITIES: Trace pedal edema.  NEUROLOGIC: Cranial nerves II through XII are intact. Muscle strength 5/5 in all extremities. Sensation intact. Gait not checked.  PSYCHIATRIC: The patient is alert and oriented x 3.  SKIN: Left leg covered.   DATA REVIEW:   CBC Recent Labs  Lab 07/28/20 0552  WBC 8.1  HGB 10.0*  HCT 34.8*  PLT 165    Chemistries  Recent Labs  Lab 07/23/20 0749 07/24/20 0343 07/27/20 0608 07/28/20 0552 07/29/20 0753  NA 141   < > 142   < > 142  K 3.8   < > 3.1*   < > 3.7  CL 94*   < > 86*   < > 87*  CO2 39*   < > 48*   < > 49*  GLUCOSE 161*   < > 114*   < > 104*  BUN 16   < > 39*   < > 31*  CREATININE 1.28*   < > 1.25*   < > 0.95  CALCIUM 9.0   < > 8.9   < > 9.0  MG 1.9   < > 2.2  --   --   AST 16  --   --   --   --   ALT 12  --   --   --   --   ALKPHOS 86  --   --   --   --   BILITOT 1.5*  --   --   --   --    < > =  values in this interval not displayed.    Cardiac Enzymes No results for input(s): TROPONINI in the last 168  hours.  Microbiology Results  Results for orders placed or performed during the hospital encounter of 07/23/20  SARS Coronavirus 2 by RT PCR (hospital order, performed in [facility] hospital lab) Nasopharyngeal Nasopharyngeal Swab     Status: None   Collection Time: 07/23/20  7:40 AM   Specimen: Nasopharyngeal Swab  Result Value Ref Range Status   SARS Coronavirus 2 NEGATIVE NEGATIVE Final    Comment: (NOTE) SARS-CoV-2 target nucleic acids are NOT DETECTED.  The SARS-CoV-2 RNA is generally detectable in upper and lower respiratory specimens during the acute phase of infection. The lowest concentration of SARS-CoV-2 viral copies this assay can detect is 250 copies / mL. A negative result does not preclude SARS-CoV-2 infection and should not be used as the sole basis for treatment or other patient management decisions.  A negative result may occur with improper specimen collection / handling, submission of specimen other than nasopharyngeal swab, presence of viral mutation(s) within the areas targeted by this assay, and inadequate number of viral copies (<250 copies / mL). A negative result must be combined with clinical observations, patient history, and epidemiological information.  Fact Sheet for Patients:   BoilerBrush.com.cy  Fact Sheet for Healthcare Providers: https://pope.com/  This test is not yet approved or  cleared by the [place] FDA and has been authorized for detection and/or diagnosis of SARS-CoV-2 by FDA under an Emergency Use Authorization (EUA).  This EUA will remain in effect (meaning this test can be used) for the duration of the COVID-19 declaration under Section 564(b)(1) of the Act, 21 U.S.C. section 360bbb-3(b)(1), unless the authorization is terminated or revoked sooner.  Performed at [facility], [address] [place], [place] [address]   Blood culture (routine single)     Status: None    Collection Time: 07/23/20  7:49 AM   Specimen: BLOOD  Result Value Ref Range Status   Specimen Description BLOOD LEFT ANTECUBITAL  Final   Special Requests   Final    BOTTLES DRAWN AEROBIC AND ANAEROBIC Blood Culture adequate volume   Culture   Final    NO GROWTH 5 DAYS Performed at [facility], [address], [place], [place] [address]    Report Status 07/28/2020 FINAL  Final  SARS Coronavirus 2 by RT PCR (hospital order, performed in [facility] hospital lab) Nasopharyngeal Nasopharyngeal Swab     Status: None   Collection Time: 07/28/20  3:08 PM   Specimen: Nasopharyngeal Swab  Result Value Ref Range Status   SARS Coronavirus 2 NEGATIVE NEGATIVE Final    Comment: (NOTE) SARS-CoV-2 target nucleic acids are NOT DETECTED.  The SARS-CoV-2 RNA is generally detectable in upper and lower respiratory specimens during the acute phase of infection. The lowest concentration of SARS-CoV-2 viral copies this assay can detect is 250 copies / mL. A negative result does not preclude SARS-CoV-2 infection and should not be used as the sole basis for treatment or other patient management decisions.  A negative result may occur with improper specimen collection / handling, submission of specimen other than nasopharyngeal swab, presence of viral mutation(s) within the areas targeted by this assay, and inadequate number of viral copies (<250 copies / mL). A negative result must be combined with clinical observations, patient history, and epidemiological information.  Fact Sheet for Patients:   BoilerBrush.com.cy  Fact Sheet for Healthcare Providers: https://pope.com/  This test  is not yet approved or  cleared by the [place] and has been authorized for detection and/or diagnosis of SARS-CoV-2 by FDA under an Emergency Use Authorization (EUA).  This EUA will remain in effect (meaning this test can be used) for the duration of  the COVID-19 declaration under Section 564(b)(1) of the Act, 21 U.S.C. section 360bbb-3(b)(1), unless the authorization is terminated or revoked sooner.  Performed at [facility], [address] [place], [place] [address]     Management plans discussed with the patient, family and they are in agreement.  CODE STATUS:     Code Status Orders  (From admission, onward)         Start     Ordered   07/23/20 1735  Full code  Continuous        07/23/20 1734        Code Status History    Date Active Date Inactive Code Status Order ID Comments User Context   06/21/2020 1326 06/25/2020 2123 Full Code [id number]  Lucile Shutters, MD ED   Advance Care Planning Activity      TOTAL TIME TAKING CARE OF THIS PATIENT: 35 minutes.    Alford Highland M.D on 07/29/2020 at 10:11 AM  Between 7am to 6pm - Pager - [phone]  After 6pm go to [email or web address] - password [email or web address]  [facility]  CC: Primary care physician; [name], MD

## 2020-07-29 NOTE — Telephone Encounter (Signed)
Pt was discharged from hospital today to [facility].

## 2020-07-30 ENCOUNTER — Telehealth: Payer: Self-pay | Admitting: Internal Medicine

## 2020-07-30 ENCOUNTER — Telehealth: Payer: Self-pay | Admitting: Cardiovascular Disease

## 2020-07-30 NOTE — Telephone Encounter (Signed)
-----   Message from Iran Ouch, MD sent at 07/29/2020 10:12 AM EDT ----- Possible discharge to rehab today TCM follow-up needed with Dr. Mariah Milling or APP in 1 to 2 weeks for acute on chronic diastolic heart failure.

## 2020-07-30 NOTE — Telephone Encounter (Signed)
Attempted to schedule.  LMOV to call office.  ° °

## 2020-07-30 NOTE — Telephone Encounter (Signed)
LMTCB

## 2020-07-30 NOTE — Telephone Encounter (Signed)
I cannot advise him on going home from [facility]  unless he is scheduled for a virtual visit NOT A TELEPHONE VISIT

## 2020-07-30 NOTE — Telephone Encounter (Signed)
Rejection Reason - Patient Declined - patient canceled and didn't reschedule " [facility] said about 22 hours ago

## 2020-08-01 DIAGNOSIS — I509 Heart failure, unspecified: Secondary | ICD-10-CM | POA: Diagnosis not present

## 2020-08-01 DIAGNOSIS — Z8679 Personal history of other diseases of the circulatory system: Secondary | ICD-10-CM | POA: Diagnosis not present

## 2020-08-01 DIAGNOSIS — G8929 Other chronic pain: Secondary | ICD-10-CM | POA: Diagnosis not present

## 2020-08-01 DIAGNOSIS — J9600 Acute respiratory failure, unspecified whether with hypoxia or hypercapnia: Secondary | ICD-10-CM | POA: Diagnosis not present

## 2020-08-01 DIAGNOSIS — J449 Chronic obstructive pulmonary disease, unspecified: Secondary | ICD-10-CM | POA: Diagnosis not present

## 2020-08-01 DIAGNOSIS — Z7901 Long term (current) use of anticoagulants: Secondary | ICD-10-CM | POA: Diagnosis not present

## 2020-08-02 ENCOUNTER — Other Ambulatory Visit: Payer: Self-pay | Admitting: *Deleted

## 2020-08-02 ENCOUNTER — Telehealth: Payer: Self-pay

## 2020-08-02 NOTE — Telephone Encounter (Signed)
Appointment has been scheduled tomorrow at 1230 telephone visit.

## 2020-08-02 NOTE — Telephone Encounter (Signed)
°  I am out of the office and will not be able to respond. If patient would like to schedule a telephone conversation  on Tuesday, you can schedule this for 12:30 tomorrow.

## 2020-08-02 NOTE — Patient Outreach (Signed)
[facility]) Care Management  08/02/2020  Victor Daniels October 29, 1946 [id number]  Care Coordination    Patient discharged from [facility] on 07/29/20 to [facility] .    Plan  Will follow patient ping for discharge disposition.    [name], RN, BSN  [facility] Care Management,Care Management Coordinator  [phone]- Mobile [phone]- Toll Free Main Office

## 2020-08-02 NOTE — Telephone Encounter (Signed)
Caller is a pt and is asking to speak to Dr. Darrick Huntsman about sxs but won't say what the sxs are. Office hours provided. Declined triage. Asks to speak to Dr. Darrick Huntsman on Monday. (Call came in Saturday afternoon but appeared on weekday scripting.)         [facility] - [facility] Nonclinical Telephone Record  [facility] - [facility] Client Site [facility] - Day Physician Duncan Dull - MD Contact Type Call Who Is Calling Patient / Member / Family / Caregiver Caller Name Victor Daniels Caller Phone Number [phone] Patient Name Victor Daniels Patient DOB Feb 28, 1946 Call Type Message Only Information Provided Reason for Call Request to Speak to a Physician Initial Comment Caller is a pt and is asking to speak to Dr. Darrick Huntsman about sxs but won't say what the sxs are. Office hours provided. Declined triage. Asks to speak to Dr. Darrick Huntsman on Monday. (Call came in Saturday afternoon but appeared on weekday scripting.) Disp. Time Disposition Final User 07/31/2020 3:17:41 PM General Information Provided Yes Gokounous, Erin Call Closed By: Tomie China Transaction Date/Time: 07/31/2020 3:09:19 PM (ET

## 2020-08-03 ENCOUNTER — Emergency Department
Admission: EM | Admit: 2020-08-03 | Discharge: 2020-08-03 | Disposition: A | Discharge status: Home / Self Care | Payer: Medicare HMO | Attending: Emergency Medicine | Admitting: Emergency Medicine

## 2020-08-03 ENCOUNTER — Other Ambulatory Visit: Payer: Self-pay

## 2020-08-03 ENCOUNTER — Encounter: Payer: Self-pay | Admitting: *Deleted

## 2020-08-03 ENCOUNTER — Ambulatory Visit (INDEPENDENT_AMBULATORY_CARE_PROVIDER_SITE_OTHER): Payer: Medicare HMO | Admitting: Internal Medicine

## 2020-08-03 ENCOUNTER — Emergency Department: Payer: Medicare HMO

## 2020-08-03 VITALS — BP 147/80 | Ht 72.01 in

## 2020-08-03 DIAGNOSIS — M4722 Other spondylosis with radiculopathy, cervical region: Secondary | ICD-10-CM | POA: Diagnosis not present

## 2020-08-03 DIAGNOSIS — J90 Pleural effusion, not elsewhere classified: Secondary | ICD-10-CM | POA: Diagnosis not present

## 2020-08-03 DIAGNOSIS — Z79899 Other long term (current) drug therapy: Secondary | ICD-10-CM | POA: Insufficient documentation

## 2020-08-03 DIAGNOSIS — N1831 Chronic kidney disease, stage 3a: Secondary | ICD-10-CM | POA: Insufficient documentation

## 2020-08-03 DIAGNOSIS — Z86718 Personal history of other venous thrombosis and embolism: Secondary | ICD-10-CM | POA: Insufficient documentation

## 2020-08-03 DIAGNOSIS — J811 Chronic pulmonary edema: Secondary | ICD-10-CM | POA: Diagnosis not present

## 2020-08-03 DIAGNOSIS — I5033 Acute on chronic diastolic (congestive) heart failure: Secondary | ICD-10-CM | POA: Insufficient documentation

## 2020-08-03 DIAGNOSIS — J441 Chronic obstructive pulmonary disease with (acute) exacerbation: Secondary | ICD-10-CM | POA: Insufficient documentation

## 2020-08-03 DIAGNOSIS — I2510 Atherosclerotic heart disease of native coronary artery without angina pectoris: Secondary | ICD-10-CM | POA: Insufficient documentation

## 2020-08-03 DIAGNOSIS — R079 Chest pain, unspecified: Secondary | ICD-10-CM | POA: Diagnosis not present

## 2020-08-03 DIAGNOSIS — R0602 Shortness of breath: Secondary | ICD-10-CM | POA: Diagnosis not present

## 2020-08-03 DIAGNOSIS — Z96649 Presence of unspecified artificial hip joint: Secondary | ICD-10-CM | POA: Diagnosis not present

## 2020-08-03 DIAGNOSIS — I517 Cardiomegaly: Secondary | ICD-10-CM | POA: Diagnosis not present

## 2020-08-03 DIAGNOSIS — Z7951 Long term (current) use of inhaled steroids: Secondary | ICD-10-CM | POA: Insufficient documentation

## 2020-08-03 DIAGNOSIS — E1122 Type 2 diabetes mellitus with diabetic chronic kidney disease: Secondary | ICD-10-CM | POA: Diagnosis not present

## 2020-08-03 DIAGNOSIS — J40 Bronchitis, not specified as acute or chronic: Secondary | ICD-10-CM | POA: Diagnosis not present

## 2020-08-03 DIAGNOSIS — Z20822 Contact with and (suspected) exposure to covid-19: Secondary | ICD-10-CM | POA: Insufficient documentation

## 2020-08-03 DIAGNOSIS — J9621 Acute and chronic respiratory failure with hypoxia: Secondary | ICD-10-CM | POA: Diagnosis not present

## 2020-08-03 DIAGNOSIS — N179 Acute kidney failure, unspecified: Secondary | ICD-10-CM | POA: Diagnosis not present

## 2020-08-03 DIAGNOSIS — I4891 Unspecified atrial fibrillation: Secondary | ICD-10-CM | POA: Insufficient documentation

## 2020-08-03 DIAGNOSIS — J9622 Acute and chronic respiratory failure with hypercapnia: Secondary | ICD-10-CM

## 2020-08-03 DIAGNOSIS — Z8673 Personal history of transient ischemic attack (TIA), and cerebral infarction without residual deficits: Secondary | ICD-10-CM | POA: Insufficient documentation

## 2020-08-03 DIAGNOSIS — Z7901 Long term (current) use of anticoagulants: Secondary | ICD-10-CM | POA: Diagnosis not present

## 2020-08-03 DIAGNOSIS — Z7952 Long term (current) use of systemic steroids: Secondary | ICD-10-CM | POA: Insufficient documentation

## 2020-08-03 DIAGNOSIS — Z87891 Personal history of nicotine dependence: Secondary | ICD-10-CM | POA: Insufficient documentation

## 2020-08-03 DIAGNOSIS — I130 Hypertensive heart and chronic kidney disease with heart failure and stage 1 through stage 4 chronic kidney disease, or unspecified chronic kidney disease: Secondary | ICD-10-CM | POA: Diagnosis not present

## 2020-08-03 LAB — BRAIN NATRIURETIC PEPTIDE: B Natriuretic Peptide: 250.4 pg/mL — ABNORMAL HIGH (ref 0.0–100.0)

## 2020-08-03 LAB — CBC WITH DIFFERENTIAL/PLATELET
Abs Immature Granulocytes: 0.03 10*3/uL (ref 0.00–0.07)
Basophils Absolute: 0 10*3/uL (ref 0.0–0.1)
Basophils Relative: 1 %
Eosinophils Absolute: 0.2 10*3/uL (ref 0.0–0.5)
Eosinophils Relative: 2 %
HCT: 36.6 % — ABNORMAL LOW (ref 39.0–52.0)
Hemoglobin: 11 g/dL — ABNORMAL LOW (ref 13.0–17.0)
Immature Granulocytes: 0 %
Lymphocytes Relative: 18 %
Lymphs Abs: 1.6 10*3/uL (ref 0.7–4.0)
MCH: 27.4 pg (ref 26.0–34.0)
MCHC: 30.1 g/dL (ref 30.0–36.0)
MCV: 91 fL (ref 80.0–100.0)
Monocytes Absolute: 0.9 10*3/uL (ref 0.1–1.0)
Monocytes Relative: 10 %
Neutro Abs: 5.9 10*3/uL (ref 1.7–7.7)
Neutrophils Relative %: 69 %
Platelets: 241 10*3/uL (ref 150–400)
RBC: 4.02 MIL/uL — ABNORMAL LOW (ref 4.22–5.81)
RDW: 15.2 % (ref 11.5–15.5)
WBC: 8.5 10*3/uL (ref 4.0–10.5)
nRBC: 0 % (ref 0.0–0.2)

## 2020-08-03 LAB — COMPREHENSIVE METABOLIC PANEL
ALT: 30 U/L (ref 0–44)
AST: 26 U/L (ref 15–41)
Albumin: 3.3 g/dL — ABNORMAL LOW (ref 3.5–5.0)
Alkaline Phosphatase: 88 U/L (ref 38–126)
Anion gap: 13 (ref 5–15)
BUN: 21 mg/dL (ref 8–23)
CO2: 37 mmol/L — ABNORMAL HIGH (ref 22–32)
Calcium: 9.4 mg/dL (ref 8.9–10.3)
Chloride: 92 mmol/L — ABNORMAL LOW (ref 98–111)
Creatinine, Ser: 1.26 mg/dL — ABNORMAL HIGH (ref 0.61–1.24)
GFR calc Af Amer: >60 mL/min (ref >60)
GFR calc non Af Amer: 56 mL/min — ABNORMAL LOW (ref >60)
Glucose, Bld: 118 mg/dL — ABNORMAL HIGH (ref 70–99)
Potassium: 3.5 mmol/L (ref 3.5–5.1)
Sodium: 142 mmol/L (ref 135–145)
Total Bilirubin: 1.2 mg/dL (ref 0.3–1.2)
Total Protein: 8.1 g/dL (ref 6.5–8.1)

## 2020-08-03 LAB — TROPONIN I (HIGH SENSITIVITY)
Troponin I (High Sensitivity): 23 ng/L — ABNORMAL HIGH (ref <18)
Troponin I (High Sensitivity): 17 ng/L (ref <18)

## 2020-08-03 LAB — SARS CORONAVIRUS 2 BY RT PCR (HOSPITAL ORDER, PERFORMED IN ~~LOC~~ HOSPITAL LAB): SARS Coronavirus 2: NEGATIVE

## 2020-08-03 MED ORDER — PREDNISONE 20 MG PO TABS: 60.0000 mg | ORAL_TABLET | Freq: Every day | ORAL | 0 refills | Status: AC

## 2020-08-03 MED ORDER — IPRATROPIUM-ALBUTEROL 0.5-2.5 (3) MG/3ML IN SOLN
3.0000 mL | Freq: Once | RESPIRATORY_TRACT | Status: AC
  Administered 2020-08-03: 3 mL via RESPIRATORY_TRACT
  Filled 2020-08-03: qty 3

## 2020-08-03 MED ORDER — AMOXICILLIN-POT CLAVULANATE 875-125 MG PO TABS
1.0000 | ORAL_TABLET | Freq: Two times a day (BID) | ORAL | 0 refills | Status: DC
Start: 2020-08-03 — End: 2020-08-03

## 2020-08-03 MED ORDER — AMOXICILLIN-POT CLAVULANATE 875-125 MG PO TABS
1.0000 | ORAL_TABLET | Freq: Two times a day (BID) | ORAL | 0 refills | Status: DC
Start: 2020-08-03 — End: 2020-08-09

## 2020-08-03 MED ORDER — AMOXICILLIN-POT CLAVULANATE 875-125 MG PO TABS
1.0000 | ORAL_TABLET | Freq: Once | ORAL | Status: AC
  Administered 2020-08-03: 1 via ORAL
  Filled 2020-08-03: qty 1

## 2020-08-03 MED ORDER — METHYLPREDNISOLONE SODIUM SUCC 125 MG IJ SOLR
125.0000 mg | Freq: Once | INTRAMUSCULAR | Status: AC
  Administered 2020-08-03: 125 mg via INTRAVENOUS
  Filled 2020-08-03: qty 2

## 2020-08-03 MED ORDER — PREDNISONE 20 MG PO TABS: 60.0000 mg | ORAL_TABLET | Freq: Every day | ORAL | 0 refills | Status: DC
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
## 2020-08-03 NOTE — Telephone Encounter (Signed)
-----   Message from Bryna Colander, RN sent at 08/02/2020  8:15 AM EDT ----- Please let me know if you reach him so I can speak with him as well if possible.  ----- Message ----- From: Iran Ouch, MD Sent: 07/29/2020  10:12 AM EDT To: Bryna Colander, RN, [facility] Scheduling  Possible discharge to rehab today TCM follow-up needed with Dr. Mariah Milling or APP in 1 to 2 weeks for acute on chronic diastolic heart failure.

## 2020-08-03 NOTE — ED Notes (Signed)
Report off to shannon rn  

## 2020-08-03 NOTE — Telephone Encounter (Signed)
Attempted to schedule no ans no vm  

## 2020-08-03 NOTE — Telephone Encounter (Signed)
See other telephone encounter pt has been scheduled for an appt.

## 2020-08-03 NOTE — ED Notes (Signed)
md at bedside

## 2020-08-03 NOTE — Assessment & Plan Note (Signed)
Treated with nebulized bronchodilator,s  Azithromycin and steroids.  augmentin added today by ER doc

## 2020-08-03 NOTE — Assessment & Plan Note (Signed)
Resolved.  Continue use of albuterol nebulized,  augmentin given by ER physician , trelegy (when affordable) and combivent (when not)

## 2020-08-03 NOTE — Assessment & Plan Note (Signed)
Acute presentation resolved.  Will reduce furosemide to once daily given bump in Cr. Continue spironolactone added by Dr Mariah Milling

## 2020-08-03 NOTE — ED Notes (Signed)
Pt brought in via ems from [facility].  Pt reports chest pain and sob tonight.  Pt took 3 ntg without pain relief.  Pt on 3 liters oxygen [place].  Pt also has sob.   No cough.  No fever.  Pt alert  Speech clear.  Iv started and labs sent.  afib on monitor.

## 2020-08-03 NOTE — ED Triage Notes (Signed)
Pt brought in via ems from [facility].  Pt has chest pain and sob.   Pt took 3 ntg.  Ems gave 324 asa  Pt alert.

## 2020-08-03 NOTE — Assessment & Plan Note (Signed)
He has bilateral hand numbness aggravated by arthritis.Marland Kitchen  He refuses any further workup given the perceived failure of his anterior cervical fusion in 2006 .  He has at times required use of oxycodone 15 mg every 6 hours prn due to CKD,   But has reduced use to three times daily.  He has not had any ER visits  And has not requested any early refills.   Refill history was confirmed via [place] Controlled Substance database by me today during visit .
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
## 2020-08-04 ENCOUNTER — Other Ambulatory Visit: Payer: Self-pay | Admitting: *Deleted

## 2020-08-04 ENCOUNTER — Telehealth: Payer: Self-pay | Admitting: Family

## 2020-08-04 NOTE — Patient Outreach (Signed)
[facility]) Care Management  [facility] Care Manager  08/04/2020   ARAF CLUGSTON 03/03/1946 [id number]    Telephone assessment  Date of Admission :07/29/20 Diagnosis : Acute on Chronic Diastolic heart failure  Date of Discharge: 08/03/20 Facility :[facility]  Insurance:[employer] Transition of care by PCP  [facility] care coordinator active with patient prior to readmission on 07/23/20  Recent Hospital Admission : [facility] 6/28-7/2 Acute on Chronic Diastolic heart failure  [facility] Admission 7/30-8/5 Acute on Chronic Diastolic heart failure  [facility] ED visit 07/03/20 Dx: Chest Pain, bronchitis  [facility] : 8/5-8/10  PMHx: 74 y.o. male with medical history significant of hypertension, hyperlipidemia COPD, on 2 L oxygen, stroke, PAD, OSA, CAD, DVT atrial fibrillation on Eliquis, dCHF, alcoholic gastritis,   Outreach Attempt #1 Subjective: Unsuccessful outreach call to patient , no answer able to leave a HIPAA compliant message for return call.    Plan Will plan return call in the next 4 business days.  Will send unsuccessful outreach letter.    [name], RN, BSN  [facility] Care Management,Care Management Coordinator  [phone]- Mobile [phone]- Toll Free Main Office

## 2020-08-04 NOTE — Telephone Encounter (Signed)
Patient Confirmed new patient CHF Clinic appointment for 8/12.   Norena Bratton, NT

## 2020-08-05 ENCOUNTER — Ambulatory Visit: Payer: Medicare HMO | Admitting: Family

## 2020-08-05 NOTE — Progress Notes (Deleted)
   Patient ID: Victor Daniels, male    DOB: November 26, 1946, 74 y.o.   MRN: [id number]  HPI  Victor Daniels is a 74 y/o male with a history of  Echo report from 06/22/20 reviewed and showed an EF of >55%.  Was in the ED 08/03/20 due to chest pain. Breathing treatment given and chest pain resolved. Given antibiotics and prednisone and he was released. Admitted 07/23/20 due to HF exacerbation. Cardiology and wound consults done. Initially given IV lasix with transition to oral diuretics. Bradycardia with beta blockers. Spironolactone added. Oxygen at 2L. Discharged after 6 days.   He presents today for his initial visit with a chief complaint  Review of Systems    Physical Exam    Assessment & Plan:  1: Chronic heart failure with preserved ejection fraction- - NYHA class - saw cardiology Jens Som) during recent admission - BNP 08/03/20 was 250.4  2: HTN- - BP - saw PCP Darrick Huntsman) 08/03/20 - BMP 08/03/20 reviewed and showed sodium 142, potassium 3.5, creatinine 1.26 and GFR 56  3: COPD-  4: Atrial fibrillation-

## 2020-08-06 ENCOUNTER — Ambulatory Visit: Payer: Self-pay | Admitting: *Deleted

## 2020-08-06 ENCOUNTER — Telehealth: Payer: Self-pay | Admitting: Family

## 2020-08-06 ENCOUNTER — Telehealth: Payer: Self-pay | Admitting: Pharmacist

## 2020-08-06 ENCOUNTER — Other Ambulatory Visit: Payer: Self-pay | Admitting: *Deleted

## 2020-08-06 DIAGNOSIS — J449 Chronic obstructive pulmonary disease, unspecified: Secondary | ICD-10-CM | POA: Diagnosis not present

## 2020-08-06 NOTE — Telephone Encounter (Signed)
°  Chronic Care Management   Note  08/06/2020 Name: Victor Daniels MRN: [id number] DOB: 18-Mar-1946   Attempted to contact patient to follow up on medication management needs s/p recent hospitalization. LVM encouraging patient to call me at his convenience. Noted to him on the voicemail that he has a call planned from RN CM Ander Purpura next Monday, and that she partners with Dr. Darrick Huntsman and I.   Plan: - If I do not hear back from him, will outreach patient in ~2 weeks as previously scheduled.   Catie Feliz Beam, PharmD, [place], CPP Clinical Pharmacist [facility] [employer] [phone]

## 2020-08-06 NOTE — Telephone Encounter (Signed)
Patient did not show for his Heart Failure Clinic appointment on 08/05/20. Will attempt to reschedule.

## 2020-08-06 NOTE — Patient Outreach (Signed)
[facility]) Care Management  08/06/2020  Victor Daniels 30-Jun-1946 [id number]   Multidisciplinary Case Discussion    Patient case was discussed in a multidisciplinary case discussion: Reviewed patient PMHX, recent admission DX . Discussed barriers : Readmission ,  Cancelled Heart failure clinic appointments , Concern regarding patient daily self care management of heart failure , with daily monitoring , and action plan for worsening symptoms .     Plan Will continue to outreach patient for follow up on post discharge recommended follow up, provide education and support on chronic condition.  As follow up  from case discussion will review Review [employer] benefits for [employer] at home.  Will introduce patient to para medicine program for heart failure program in [facility]. Will introduce Palliative care for chronic condition management with patient .    Victor Garibaldi, RN, BSN  [facility] Care Management,Care Management Coordinator  [phone]- Mobile [phone]- Toll Free Main Office

## 2020-08-07 ENCOUNTER — Other Ambulatory Visit: Payer: Self-pay | Admitting: Internal Medicine

## 2020-08-09 ENCOUNTER — Other Ambulatory Visit: Payer: Self-pay | Admitting: *Deleted

## 2020-08-09 ENCOUNTER — Ambulatory Visit (INDEPENDENT_AMBULATORY_CARE_PROVIDER_SITE_OTHER): Payer: Medicare HMO | Admitting: Pharmacist

## 2020-08-09 ENCOUNTER — Encounter: Payer: Self-pay | Admitting: *Deleted

## 2020-08-09 ENCOUNTER — Telehealth: Payer: Self-pay | Admitting: *Deleted

## 2020-08-09 VITALS — Wt 263.4 lb

## 2020-08-09 DIAGNOSIS — I5030 Unspecified diastolic (congestive) heart failure: Secondary | ICD-10-CM

## 2020-08-09 DIAGNOSIS — I4819 Other persistent atrial fibrillation: Secondary | ICD-10-CM

## 2020-08-09 DIAGNOSIS — E782 Mixed hyperlipidemia: Secondary | ICD-10-CM

## 2020-08-09 MED ORDER — APIXABAN 5 MG PO TABS: 5.0000 mg | ORAL_TABLET | Freq: Two times a day (BID) | ORAL | 1 refills | Status: DC

## 2020-08-09 MED ORDER — SIMVASTATIN 20 MG PO TABS: 20.0000 mg | ORAL_TABLET | Freq: Every day | ORAL | 3 refills | Status: DC

## 2020-08-09 MED ORDER — SPIRONOLACTONE 25 MG PO TABS: 25.0000 mg | ORAL_TABLET | Freq: Every day | ORAL | 1 refills | Status: DC

## 2020-08-09 NOTE — Patient Instructions (Addendum)
Victor Daniels,   Here is how you should be taking your medications:   Morning: - Furosemide 20 mg (your prescription is for 40 mg, but keep taking what you have been at home for now) - Spironolactone 25 mg - this is a NEW prescription to benefit your heart and help keep fluid off.  - Eliquis 5 mg - this is a blood thinner to help reduce your risk of having a stroke due to atrial fibrillation - Vitamin B12  Evening: - Eliquis 5 mg  - Losartan 25 mg - blood pressure, heart protection - Simvastatin 20 mg - cholesterol, also reduces your risk of heart attacks and strokes.   We MUST check your kidney function after starting the spironolactone. We have scheduled that lab appointment next week, Tuesday August 24 at 2:15.   Make sure you STOPPED any pills not on the above list.   It is IMPERATIVE that you weigh every day to keep an eye on your fluid status to prevent having to go back to the hospital for a heart failure exacerbation (flare). If you gain 3 lbs in a day or 5 lbs in a week, call Dr. Donivan Scull office for instruction.   Please call me if you have any questions! I will call you next Thursday.   Victor Daniels, PharmD [phone]  Visit Information   Visit Information  Goals Addressed              This Visit's Progress     Patient Stated   .  "My breathing is bad" (pt-stated)        CARE PLAN ENTRY (see longtitudinal plan of care for additional care plan information)  Current Barriers:  . Social, community, and financial barriers:  o Hospitalized 6/28-7/2 and then readmitted 7/30-8/5 for HF exacerbation. D/c to [facility], but then returned to ED and to home.  o Reports decompensation over the past few months. Reports that he plans to try to be more active, plans to walk around outside with his walker, and plans to drive to [place] today. Declines referral for home PT because he doesn't want anyone in his home o Reports today that he has lost "lots of  weight" over the past few weeks, presumably since last hospitalization. Reports that when he talked to Dr. Derrel Nip last week, he "estimated" his weight at 290 lbs, but has since weighed and weight this morning was 263 lbs . Last eGFR ~56 mL/min . Polypharmacy; complex patient with multiple comorbidities including COPD (hx tobacco abuse); atrial fibrillation, CHF (reduced but recovered, most recent 55%), CAD (hx MI, CVA x2), CKD (single kidney) o CHF: Losartan 25 mg QPM, furosemide 20 mg QAM (prescribed 40 mg BID, but then reduced to QAM by PCP last week, but patient has actually been taking from a 20 mg bottle), prescribed spironolactone 25 mg QAM but reports that he never picked this up from the pharmacy. Not checking BP at home, but does say that he has started weighing o Afib: recently started on Eliquis 5 mg BID, reports he needs a refill today.  o ASCVD risk reduction: prescribed simvastatin 20 mg daily, but patient reports he hasn't taken that one in a long time because he didn't have it o COPD: Trelegy daily, albuterol HFA or Duonebs TID-QID; oxygen 24 hours.  o Anemia w/ CKD: recommended to f/u with Dr. Candiss Norse. Needs to schedule. o Chronic pain: oxycodone 15 mg Q6H prn. PRN methocarbamol use, though using infrequently. Reports he is trying  to reduce how often he takes oxycodone right now  o Urinary symptoms: saw Dr. Bernardo Heater, took tamsulosin 0.4 mg daily x 30 days and did not report any benefit, so did not refill  Pharmacist Clinical Goal(s):  Marland Kitchen Over the next 90 days, patient will work with PharmD and provider towards optimized medication management  Interventions: . Comprehensive medication review performed, medication list updated in electronic medical record . Inter-disciplinary care team collaboration (see longitudinal plan of care) . Extensive review of medications and indications. Sent refills on Eliquis, ventolin, spironolactone, and simvastatin. He will likely start spironolactone  tomorrow QAM. Scheduled patient for BMP next week . Discussed importance of daily weights. Reviewed to contact Dr. Donivan Scull office w/ weight gain >3 lbs in 1 day, >5 lbs in 1 week. He verbalized understanding.  . Reiterated importance of team-based care and encouraged to collaborate w/ RN CM. He verbalized understanding. He will accept her call later this afternoon.  Patient Self Care Activities:  . Patient will take medications as prescribed  Please see past updates related to this goal by clicking on the "Past Updates" button in the selected goal         The patient verbalized understanding of instructions provided today and agreed to receive a mailed copy of patient instruction and/or educational materials.  Plan:  - Will outreach next week as previously scheduled  Victor Daniels, PharmD, [place], [place] Pharmacist [place] [phone]

## 2020-08-09 NOTE — Patient Outreach (Addendum)
[facility]) Care Management  [place]  08/09/2020   Victor Daniels November 23, 1946 [id number]   Telephone Assessment   Date of Admission :07/29/20 Diagnosis : Acute on Chronic Diastolic heart failure  Date of Discharge: 08/03/20 Facility :[facility]  Insurance:[employer] Transition of care by PCP   Recent Hospital Admission : [facility] 6/28-7/2 Acute on Chronic Diastolic heart failure  [facility] Admission 7/30-8/5 Acute on Chronic Diastolic heart failure  [facility] ED visit 07/03/20 Dx: Chest Pain, bronchitis  [facility] : 8/5-8/10  PMHx: 74 y.o.malewith medical history significant ofhypertension, hyperlipidemia COPD, on 2 L oxygen, stroke, PAD, OSA, CAD, DVT atrial fibrillation on Eliquis,dCHF, alcoholic gastritis,  Subjective:  Successful outreach call to patient , reintroduced self and [facility] care management .  Patient discussed feeling fair on today. He discussed that since discharge from hospital he is doing better. He reports decrease in fluid in legs and thighs he is able to get pants on without assistance.  Patient reports today's weight 262.3 on today, he states that everything is in good working order, its taken care of  when I  discussed his previous complaint of scales not working . Reviewed weigh at discharge was 282.    Encounter Medications:  Outpatient Encounter Medications as of 08/09/2020  Medication Sig Note  . albuterol (PROVENTIL) (2.5 MG/3ML) 0.083% nebulizer solution Take 3 mLs (2.5 mg total) by nebulization every 6 (six) hours as needed for wheezing or shortness of breath.   Marland Kitchen apixaban (ELIQUIS) 5 MG TABS tablet Take 1 tablet (5 mg total) by mouth 2 (two) times daily.   . Fluticasone-Umeclidin-Vilant (TRELEGY ELLIPTA) 100-62.5-25 MCG/INH AEPB Inhale 1 puff into the lungs daily.   . furosemide (LASIX) 40 MG tablet Take 1 tablet (40 mg total) by mouth 2 (two) times daily. 08/09/2020: Taking 20 mg QAM  . ipratropium (ATROVENT) 0.03 %  nasal spray ipratropium bromide 21 mcg (0.03 %) nasal spray   . Ipratropium-Albuterol (COMBIVENT RESPIMAT) 20-100 MCG/ACT AERS respimat Inhale 1 puff into the lungs every 6 (six) hours as needed for wheezing. (Patient not taking: Reported on 08/09/2020)   . losartan (COZAAR) 25 MG tablet Take 1 tablet (25 mg total) by mouth daily.   . methocarbamol (ROBAXIN) 500 MG tablet methocarbamol 500 mg tablet (Patient not taking: Reported on 08/09/2020)   . Multiple Vitamin (MULTIVITAMIN) capsule Take 1 capsule by mouth daily.   . nitroGLYCERIN (NITROSTAT) 0.4 MG SL tablet Place 1 tablet (0.4 mg total) under the tongue every 5 (five) minutes as needed for chest pain. Maximum dose 3 tablets (Patient not taking: Reported on 08/09/2020)   . omeprazole (PRILOSEC) 40 MG capsule omeprazole 40 mg capsule,delayed release (Patient not taking: Reported on 08/09/2020)   . oxyCODONE (ROXICODONE) 15 MG immediate release tablet Take 1 tablet (15 mg total) by mouth every 6 (six) hours as needed.   . OXYGEN Inhale 2 L into the lungs 3 (three) times daily as needed (shortness of breath).    . simvastatin (ZOCOR) 20 MG tablet Take 1 tablet (20 mg total) by mouth at bedtime.   Marland Kitchen spironolactone (ALDACTONE) 25 MG tablet Take 1 tablet (25 mg total) by mouth daily.   . tamsulosin (FLOMAX) 0.4 MG CAPS capsule Take 1 capsule (0.4 mg total) by mouth daily. (Patient not taking: Reported on 08/09/2020)   . VENTOLIN HFA 108 (90 Base) MCG/ACT inhaler INHALE 2 PUFFS INTO LUNGS EVERY 6 HOURS AS NEEDED FOR WHEEZING OR SHORTNESS OF BREATH   . vitamin B-12 (CYANOCOBALAMIN)  1000 MCG tablet Take 1,000 mcg by mouth daily.    No facility-administered encounter medications on file as of 08/09/2020.    Functional Status:  In your present state of health, do you have any difficulty performing the following activities: 08/09/2020 07/28/2020  Hearing? N N  Vision? N N  Difficulty concentrating or making decisions? N N  Walking or climbing stairs? Tempie Donning    Comment uses walker uses walker  Dressing or bathing? Tempie Donning  Comment wife assist at times wife helps  Doing errands, shopping? Tempie Donning  Comment wife assist, patient does not drive wife assists  Conservation officer, nature and eating ? N -  Using the Toilet? N -  In the past six months, have you accidently leaked urine? Y -  Comment wears depends -  Do you have problems with loss of bowel control? N -  Managing your Medications? Y -  Comment wife assist at time , [facility] embedded pharmacist -  Managing your Finances? N -  Housekeeping or managing your Housekeeping? Y -  Comment wife does household management -  Some recent data might be hidden    Fall/Depression Screening: Fall Risk  08/09/2020 08/03/2020 07/06/2020  Falls in the past year? '1 1 1  ' Number falls in past yr: 0 0 1  Injury with Fall? 0 0 0  Risk for fall due to : Impaired balance/gait - History of fall(s);Impaired balance/gait  Follow up Falls prevention discussed Falls evaluation completed Falls prevention discussed   PHQ 2/9 Scores 08/09/2020 08/03/2020 06/29/2020 03/08/2020 05/01/2019 03/06/2019 07/23/2018  PHQ - 2 Score 0 0 1 0 0 0 0  PHQ- 9 Score - - - - 0 - -   SDOH Screenings   Alcohol Screen:   . Last Alcohol Screening Score (AUDIT):   Depression (PHQ2-9): Low Risk   . PHQ-2 Score: 0  Financial Resource Strain: Low Risk   . Difficulty of Paying Living Expenses: Not hard at all  Food Insecurity: No Food Insecurity  . Worried About Charity fundraiser in the Last Year: Never true  . Ran Out of Food in the Last Year: Never true  Housing: Low Risk   . Last Housing Risk Score: 0  Physical Activity:   . Days of Exercise per Week:   . Minutes of Exercise per Session:   Social Connections:   . Frequency of Communication with Friends and Family:   . Frequency of Social Gatherings with Friends and Family:   . Attends Religious Services:   . Active Member of Clubs or Organizations:   . Attends Archivist Meetings:   Marland Kitchen Marital  Status:   Stress:   . Feeling of Stress :   Tobacco Use: Medium Risk  . Smoking Tobacco Use: Former Smoker  . Smokeless Tobacco Use: Never Used  Transportation Needs: No Transportation Needs  . Lack of Transportation (Medical): No  . Lack of Transportation (Non-Medical): No    Assessment:   Heart failure Denies worsening of heart failure symptoms when reviewed, denies sudden weight gain, worsening shortness of breath or swelling. Some improvement with self care at home with personal care, and mobility. Wife helps with limiting salt in diet per patient .  Reports  weight of 263.3 emphasized importance of accurate daily weights.  Patient will benefit from continued education and support of chronic conditions.  Introduced para medicine program in [place], patient declined. Discussed benefits of [employer] Heart failure program for support of chronic conditions, but declines  at this time.  Patient declines additional written education material at this time.     COPD Continues wearing oxygen at  2 liters, denies worsening shortness of breath , increased cough, tolerating some increase in mobility.   Medications Review by [facility] embedded Pharmacist Catie Darnelle Maffucci on today.   Appointments  Cardiology Follow up on 08/23/20.    Plan: Will plan return call in the next 2 weeks, will re address [employer] at Home program. Will send this initial visit note to PCP.  [facility] CM Care Plan Problem One     Most Recent Value  Care Plan Problem One At risk for readmission related to recent disccharge due to diagnosis of heart faiilure, less than 30 day readmission    Role Documenting the Problem One Care Management Telephonic Coordinator  Care Plan for Problem One Active  [facility] Long Term Goal  Patient will not report hospital readmission over the next 30 days   [facility] Long Term Goal Start Date 08/09/20  Interventions for Problem One Long Term Goal Discussed current clinical state, reviewed importance of   daily self care measures to manage heart failure to help prevent readmission, taking medications as prescribed, attending all medical appointments, activity as tolerated, Discussed role of low salt diet, reviewed daily limits of 2 gm and discussed daily flluid limts around 1500 ml, ( 3 16.9 ounce bottles of water as description)    [facility] CM Short Term Goal #1  Over the next 30 days patient will be able to report monitoring and keeping a log on daily weights   [facility] CM Short Term Goal #1 Start Date 08/09/20  Interventions for Short Term Goal #1 Advised patient regarding daily weight monitoring best time of day, to weight, importance of keeping a written record to help with ease of identifying sudden increased.   [facility] CM Short Term Goal #2  Over the next 30 days patient will attend all medical appointments   [facility] CM Short Term Goal #2 Start Date 08/09/20  Interventions for Short Term Goal #2 Review of upcoming medical appointments , cardiology  8/30,   [facility] CM Short Term Goal #3 Patient will be able to verbalize 3 signs & symptoms in yellow zone Heart failure to report to MD over the next 30 days   [facility] CM Short Term Goal #3 Start Date 08/09/20  Barrie Folk restart ]  Interventions for Short Tern Goal #3 Emphasized education on heart failure zones, and action plan to notify MD of sudden weight gain of 3 pounds in a day, 5 in a week, increased sob, swelling. Encouraged importance of notifying MD sooner when yellow zone symptoms identified.   [facility] CM Short Term Goal #4 Over the next 30 days patient will report walking at least 10 minutes 4 days a week.   [facility] CM Short Term Goal #4 Start Date 08/09/20  [facility] CM Short Term Goal #4 Met Date 08/09/20  Interventions for Short Term Goal #4 Discussed the benefits of exercise activity to help with strengtening , benefits for heart and lungs       Joylene Draft, RN, BSN  [place] Management Coordinator  [phone]- Mobile [phone]- [place]

## 2020-08-09 NOTE — Telephone Encounter (Signed)
TCM....  Patient is being discharged   They saw Olga Millers [facility] pt)  They are scheduled to see Gillian Shields on 8/30  They were seen for CHF  They need to be seen within 2 weeks  Pt not placed wait list   Please call

## 2020-08-09 NOTE — Chronic Care Management (AMB) (Signed)
Chronic Care Management   Follow Up Note   08/09/2020 Name: Victor Daniels MRN: [id number] DOB: November 03, 1946  Referred by: Victor Mc, MD Reason for referral : Chronic Care Management (Medication Management)   Victor Daniels is a 74 y.o. year old male who is a primary care patient of Tullo, Victor Everts, MD. The CCM team was consulted for assistance with chronic disease management and care coordination needs.    Contacted patient for medication management review.   Review of patient status, including review of consultants reports, relevant laboratory and other test results, and collaboration with appropriate care team members and the patient's provider was performed as part of comprehensive patient evaluation and provision of chronic care management services.    SDOH (Social Determinants of Health) assessments performed: No See Care Plan activities for detailed interventions related to Victor Daniels)     Outpatient Encounter Medications as of 08/09/2020  Medication Sig Note  . albuterol (PROVENTIL) (2.5 MG/3ML) 0.083% nebulizer solution Take 3 mLs (2.5 mg total) by nebulization every 6 (six) hours as needed for wheezing or shortness of breath.   Marland Kitchen apixaban (ELIQUIS) 5 MG TABS tablet Take 1 tablet (5 mg total) by mouth 2 (two) times daily.   . Fluticasone-Umeclidin-Vilant (TRELEGY ELLIPTA) 100-62.5-25 MCG/INH AEPB Inhale 1 puff into the lungs daily.   . furosemide (LASIX) 40 MG tablet Take 1 tablet (40 mg total) by mouth 2 (two) times daily. 08/09/2020: Taking 20 mg QAM  . ipratropium (ATROVENT) 0.03 % nasal spray ipratropium bromide 21 mcg (0.03 %) nasal spray   . losartan (COZAAR) 25 MG tablet Take 1 tablet (25 mg total) by mouth daily.   . Multiple Vitamin (MULTIVITAMIN) capsule Take 1 capsule by mouth daily.   Marland Kitchen oxyCODONE (ROXICODONE) 15 MG immediate release tablet Take 1 tablet (15 mg total) by mouth every 6 (six) hours as needed.   . OXYGEN Inhale 2 L into the lungs 3 (three)  times daily as needed (shortness of breath).    . vitamin B-12 (CYANOCOBALAMIN) 1000 MCG tablet Take 1,000 mcg by mouth daily.   . [DISCONTINUED] amLODipine (NORVASC) 5 MG tablet amlodipine 5 mg tablet   . [DISCONTINUED] apixaban (ELIQUIS) 5 MG TABS tablet Take 1 tablet (5 mg total) by mouth 2 (two) times daily.   . [DISCONTINUED] VENTOLIN HFA 108 (90 Base) MCG/ACT inhaler INHALE 2 PUFFS EVERY 6 HOURS AS NEEDED FOR WHEEZING OR SHORTNESS OF BREATH   . Ipratropium-Albuterol (COMBIVENT RESPIMAT) 20-100 MCG/ACT AERS respimat Inhale 1 puff into the lungs every 6 (six) hours as needed for wheezing. (Patient not taking: Reported on 08/09/2020)   . methocarbamol (ROBAXIN) 500 MG tablet methocarbamol 500 mg tablet (Patient not taking: Reported on 08/09/2020)   . nitroGLYCERIN (NITROSTAT) 0.4 MG SL tablet Place 1 tablet (0.4 mg total) under the tongue every 5 (five) minutes as needed for chest pain. Maximum dose 3 tablets (Patient not taking: Reported on 08/09/2020)   . omeprazole (PRILOSEC) 40 MG capsule omeprazole 40 mg capsule,delayed release (Patient not taking: Reported on 08/09/2020)   . simvastatin (ZOCOR) 20 MG tablet Take 1 tablet (20 mg total) by mouth at bedtime.   Marland Kitchen spironolactone (ALDACTONE) 25 MG tablet Take 1 tablet (25 mg total) by mouth daily.   . tamsulosin (FLOMAX) 0.4 MG CAPS capsule Take 1 capsule (0.4 mg total) by mouth daily. (Patient not taking: Reported on 08/09/2020)   . [DISCONTINUED] amoxicillin-clavulanate (AUGMENTIN) 875-125 MG tablet Take 1 tablet by mouth 2 (two) times daily for 7  days.   . [DISCONTINUED] azithromycin (ZITHROMAX) 500 MG tablet azithromycin 500 mg tablet   . [DISCONTINUED] barrier cream (NON-SPECIFIED) CREA Apply 1 application topically See admin instructions. Apply topically to sacrum and groin every shift.   . [DISCONTINUED] clopidogrel (PLAVIX) 75 MG tablet clopidogrel 75 mg tablet (Patient not taking: Reported on 08/09/2020)   . [DISCONTINUED] Difluprednate  (DUREZOL) 0.05 % EMUL Durezol 0.05 % eye drops   . [DISCONTINUED] doxycycline (VIBRAMYCIN) 100 MG capsule doxycycline hyclate 100 mg capsule   . [DISCONTINUED] Fluticasone-Salmeterol (ADVAIR) 500-50 MCG/DOSE AEPB Inhale into the lungs.   . [DISCONTINUED] hydrochlorothiazide (HYDRODIURIL) 25 MG tablet hydrochlorothiazide 25 mg tablet   . [DISCONTINUED] HYDROcodone-acetaminophen (NORCO) 10-325 MG tablet hydrocodone 10 mg-acetaminophen 325 mg tablet   . [DISCONTINUED] HYDROcodone-homatropine (HYCODAN) 5-1.5 MG/5ML syrup hydrocodone-homatropine 5 mg-1.5 mg/5 mL oral syrup  TAKE  5 ML BY MOUTH AT BEDTIME AS NEEDED FOR COUGH   . [DISCONTINUED] lisinopril (ZESTRIL) 20 MG tablet lisinopril 20 mg tablet   . [DISCONTINUED] losartan-hydrochlorothiazide (HYZAAR) 100-25 MG tablet losartan 100 mg-hydrochlorothiazide 25 mg tablet   . [DISCONTINUED] menthol-cetylpyridinium (CEPACOL) 3 MG lozenge Take 1 lozenge (3 mg total) by mouth every 2 (two) hours as needed for sore throat.   . [DISCONTINUED] oxyCODONE-acetaminophen (PERCOCET/ROXICET) 5-325 MG tablet oxycodone-acetaminophen 5 mg-325 mg tablet   . [DISCONTINUED] simvastatin (ZOCOR) 20 MG tablet Take 1 tablet (20 mg total) by mouth at bedtime. (Patient not taking: Reported on 08/09/2020)   . [DISCONTINUED] Skin Protectants, Misc. (MINERIN CREME EX) Apply 1 application topically 2 (two) times daily.   . [DISCONTINUED] spironolactone (ALDACTONE) 25 MG tablet Take 1 tablet (25 mg total) by mouth daily. (Patient not taking: Reported on 08/09/2020)   . [DISCONTINUED] traMADol (ULTRAM) 50 MG tablet tramadol 50 mg tablet  TAKE 1 TABLET BY MOUTH EVERY 6 HOURS AS NEEDED FOR PAIN (Patient not taking: Reported on 08/09/2020)    No facility-administered encounter medications on file as of 08/09/2020.     Objective:   Goals Addressed              This Visit's Progress     Patient Stated   .  "My breathing is bad" (pt-stated)        CARE PLAN ENTRY (see  longtitudinal plan of care for additional care plan information)  Current Barriers:  . Social, community, and financial barriers:  o Hospitalized 6/28-7/2 and then readmitted 7/30-8/5 for HF exacerbation. D/c to [facility], but then returned to ED and to home.  o Reports decompensation over the past few months. Reports that he plans to try to be more active, plans to walk around outside with his walker, and plans to drive to [place] today. Declines referral for home PT because he doesn't want anyone in his home o Reports today that he has lost "lots of weight" over the past few weeks, presumably since last hospitalization. Reports that when he talked to Dr. Derrel Nip last week, he "estimated" his weight at 290 lbs, but has since weighed and weight this morning was 263 lbs . Last eGFR ~56 mL/min . Polypharmacy; complex patient with multiple comorbidities including COPD (hx tobacco abuse); atrial fibrillation, CHF (reduced but recovered, most recent 55%), CAD (hx MI, CVA x2), CKD (single kidney) o CHF: Losartan 25 mg QPM, furosemide 20 mg QAM (prescribed 40 mg BID, but then reduced to QAM by PCP last week, but patient has actually been taking from a 20 mg bottle), prescribed spironolactone 25 mg QAM but reports that he  never picked this up from the pharmacy. Not checking BP at home, but does say that he has started weighing o Afib: recently started on Eliquis 5 mg BID, reports he needs a refill today.  o ASCVD risk reduction: prescribed simvastatin 20 mg daily, but patient reports he hasn't taken that one in a long time because he didn't have it o COPD: Trelegy daily, albuterol HFA or Duonebs TID-QID; oxygen 24 hours.  o Anemia w/ CKD: recommended to f/u with Dr. Candiss Norse. Needs to schedule. o Chronic pain: oxycodone 15 mg Q6H prn. PRN methocarbamol use, though using infrequently. Reports he is trying to reduce how often he takes oxycodone right now  o Urinary symptoms: saw Dr. Bernardo Heater, took tamsulosin  0.4 mg daily x 30 days and did not report any benefit, so did not refill  Pharmacist Clinical Goal(s):  Marland Kitchen Over the next 90 days, patient will work with PharmD and provider towards optimized medication management  Interventions: . Comprehensive medication review performed, medication list updated in electronic medical record . Inter-disciplinary care team collaboration (see longitudinal plan of care) . Extensive review of medications and indications. Sent refills on Eliquis, ventolin, spironolactone, and simvastatin. He will likely start spironolactone tomorrow QAM. Scheduled patient for BMP next week . Discussed importance of daily weights. Reviewed to contact Dr. Donivan Scull office w/ weight gain >3 lbs in 1 day, >5 lbs in 1 week. He verbalized understanding.  . Reiterated importance of team-based care and encouraged to collaborate w/ RN CM. He verbalized understanding. He will accept her call later this afternoon.  Patient Self Care Activities:  . Patient will take medications as prescribed  Please see past updates related to this goal by clicking on the "Past Updates" button in the selected goal          Plan:  - Will outreach next week as previously scheduled  Catie Darnelle Maffucci, PharmD, [place], [place] Pharmacist [place] [phone]

## 2020-08-09 NOTE — Telephone Encounter (Signed)
Pt calling stating that his urinary frequency is not any better, requesting an appt. Per your last office note:    1. Severe LUTS -Most like secondary to BPH -Trial tamsulosin 0.4 mg daily. -Follow up in 1 month; cystoscopy if there is no improvement  Per pt no improvement, cysto scheduled for 08/19/2020 at 3:00pm

## 2020-08-10 NOTE — Telephone Encounter (Signed)
Patient contacted regarding discharge from [facility] on 08/03/20.  Patient understands to follow up with provider Dan Humphreys, NP on 08/23/20 at 2 pm at [facility]. Patient understands discharge instructions? yes Patient understands medications and regiment? yes Patient understands to bring all medications to this visit? Yes  Ask patient:  Are you enrolled in My Chart YES

## 2020-08-11 ENCOUNTER — Ambulatory Visit: Payer: Medicare HMO | Admitting: Pharmacist

## 2020-08-11 DIAGNOSIS — I5030 Unspecified diastolic (congestive) heart failure: Secondary | ICD-10-CM

## 2020-08-11 DIAGNOSIS — J431 Panlobular emphysema: Secondary | ICD-10-CM

## 2020-08-11 NOTE — Chronic Care Management (AMB) (Signed)
Chronic Care Management   Follow Up Note   08/11/2020 Name: Victor Daniels MRN: [id number] DOB: 22-Feb-1946  Referred by: Crecencio Mc, MD Reason for referral : Chronic Care Management (Medication Management)   Victor Daniels is a 74 y.o. year old male who is a primary care patient of Tullo, Aris Everts, MD. The CCM team was consulted for assistance with chronic disease management and care coordination needs.    Received call from patient today  Review of patient status, including review of consultants reports, relevant laboratory and other test results, and collaboration with appropriate care team members and the patient's provider was performed as part of comprehensive patient evaluation and provision of chronic care management services.    SDOH (Social Determinants of Health) assessments performed: No See Care Plan activities for detailed interventions related to [facility])     Outpatient Encounter Medications as of 08/11/2020  Medication Sig Note  . albuterol (PROVENTIL) (2.5 MG/3ML) 0.083% nebulizer solution Take 3 mLs (2.5 mg total) by nebulization every 6 (six) hours as needed for wheezing or shortness of breath.   Marland Kitchen apixaban (ELIQUIS) 5 MG TABS tablet Take 1 tablet (5 mg total) by mouth 2 (two) times daily.   . Fluticasone-Umeclidin-Vilant (TRELEGY ELLIPTA) 100-62.5-25 MCG/INH AEPB Inhale 1 puff into the lungs daily.   . furosemide (LASIX) 40 MG tablet Take 1 tablet (40 mg total) by mouth 2 (two) times daily. 08/09/2020: Taking 20 mg QAM  . ipratropium (ATROVENT) 0.03 % nasal spray ipratropium bromide 21 mcg (0.03 %) nasal spray   . Ipratropium-Albuterol (COMBIVENT RESPIMAT) 20-100 MCG/ACT AERS respimat Inhale 1 puff into the lungs every 6 (six) hours as needed for wheezing. (Patient not taking: Reported on 08/09/2020)   . losartan (COZAAR) 25 MG tablet Take 1 tablet (25 mg total) by mouth daily.   . methocarbamol (ROBAXIN) 500 MG tablet methocarbamol 500 mg tablet  (Patient not taking: Reported on 08/09/2020)   . Multiple Vitamin (MULTIVITAMIN) capsule Take 1 capsule by mouth daily.   . nitroGLYCERIN (NITROSTAT) 0.4 MG SL tablet Place 1 tablet (0.4 mg total) under the tongue every 5 (five) minutes as needed for chest pain. Maximum dose 3 tablets (Patient not taking: Reported on 08/09/2020)   . omeprazole (PRILOSEC) 40 MG capsule omeprazole 40 mg capsule,delayed release (Patient not taking: Reported on 08/09/2020)   . oxyCODONE (ROXICODONE) 15 MG immediate release tablet Take 1 tablet (15 mg total) by mouth every 6 (six) hours as needed.   . OXYGEN Inhale 2 L into the lungs 3 (three) times daily as needed (shortness of breath).    . simvastatin (ZOCOR) 20 MG tablet Take 1 tablet (20 mg total) by mouth at bedtime.   Marland Kitchen spironolactone (ALDACTONE) 25 MG tablet Take 1 tablet (25 mg total) by mouth daily.   . tamsulosin (FLOMAX) 0.4 MG CAPS capsule Take 1 capsule (0.4 mg total) by mouth daily. (Patient not taking: Reported on 08/09/2020)   . VENTOLIN HFA 108 (90 Base) MCG/ACT inhaler INHALE 2 PUFFS INTO LUNGS EVERY 6 HOURS AS NEEDED FOR WHEEZING OR SHORTNESS OF BREATH   . vitamin B-12 (CYANOCOBALAMIN) 1000 MCG tablet Take 1,000 mcg by mouth daily.    No facility-administered encounter medications on file as of 08/11/2020.     Objective:   Goals Addressed              This Visit's Progress     Patient Stated   .  "My breathing is bad" (pt-stated)  CARE PLAN ENTRY (see longtitudinal plan of care for additional care plan information)  Current Barriers:  . Social, community, and financial barriers:  o Patient called to say that he thought about it, and would like me to send him a pill box to help organize his medicatoins . Last eGFR ~56 mL/min . Polypharmacy; complex patient with multiple comorbidities including COPD (hx tobacco abuse); atrial fibrillation, CHF (reduced but recovered, most recent 55%), CAD (hx MI, CVA x2), CKD (single kidney) o CHF:  Losartan 25 mg QPM, furosemide 20 mg QAM, spironolactone 25 mg QAM o Afib: recently started on Eliquis 5 mg BID  o ASCVD risk reduction: simvastatin 20 mg daily  o COPD: Trelegy daily, albuterol HFA or Duonebs TID-QID; oxygen 24 hours.  o Anemia w/ CKD: recommended to f/u with Dr. Candiss Norse. Needs to schedule. o Chronic pain: oxycodone 15 mg Q6H prn. PRN methocarbamol use, though using infrequently.  o Urinary symptoms: saw Dr. Bernardo Heater, took tamsulosin 0.4 mg daily x 30 days and did not report any benefit, so did not refill  Pharmacist Clinical Goal(s):  Marland Kitchen Over the next 90 days, patient will work with PharmD and provider towards optimized medication management  Interventions: Marland Kitchen Mailing BID pill box and list of medications/indications and recommended administration times.   Patient Self Care Activities:  . Patient will take medications as prescribed  Please see past updates related to this goal by clicking on the "Past Updates" button in the selected goal          Plan:  - Will follow up as previously scheduled  Catie Darnelle Maffucci, PharmD, [place], [place] Pharmacist [place] [phone]

## 2020-08-11 NOTE — Patient Instructions (Addendum)
Ed,  Here is how you should be taking your medications:   Morning: - Furosemide 20 mg (your prescription is for 40 mg, but keep taking what you have been at home for now) - Spironolactone 25 mg - this is a NEW prescription to benefit your heart and help keep fluid off.  - Eliquis 5 mg - this is a blood thinner to help reduce your risk of having a stroke due to atrial fibrillation - Vitamin B12  Evening: - Eliquis 5 mg  - Losartan 25 mg - blood pressure, heart protection - Simvastatin 20 mg - cholesterol, also reduces your risk of heart attacks and strokes.   We MUST check your kidney function after starting the spironolactone. We have scheduled that lab appointment next week, Tuesday August 24 at 2:15.   Make sure you STOPPED any pills not on the above list.   It is IMPERATIVE that you weigh every day to keep an eye on your fluid status to prevent having to go back to the hospital for a heart failure exacerbation (flare). If you gain 3 lbs in a day or 5 lbs in a week, call Dr. Donivan Scull office for instruction.   Please call me if you have any questions! I will call you next Thursday.   Catie Darnelle Maffucci, PharmD [phone]  Visit Information  Goals Addressed              This Visit's Progress     Patient Stated   .  "My breathing is bad" (pt-stated)        CARE PLAN ENTRY (see longtitudinal plan of care for additional care plan information)  Current Barriers:  . Social, community, and financial barriers:  o Patient called to say that he thought about it, and would like me to send him a pill box to help organize his medicatoins . Last eGFR ~56 mL/min . Polypharmacy; complex patient with multiple comorbidities including COPD (hx tobacco abuse); atrial fibrillation, CHF (reduced but recovered, most recent 55%), CAD (hx MI, CVA x2), CKD (single kidney) o CHF: Losartan 25 mg QPM, furosemide 20 mg QAM, spironolactone 25 mg QAM o Afib: recently started on Eliquis 5 mg BID   o ASCVD risk reduction: simvastatin 20 mg daily  o COPD: Trelegy daily, albuterol HFA or Duonebs TID-QID; oxygen 24 hours.  o Anemia w/ CKD: recommended to f/u with Dr. Candiss Norse. Needs to schedule. o Chronic pain: oxycodone 15 mg Q6H prn. PRN methocarbamol use, though using infrequently.  o Urinary symptoms: saw Dr. Bernardo Heater, took tamsulosin 0.4 mg daily x 30 days and did not report any benefit, so did not refill  Pharmacist Clinical Goal(s):  Marland Kitchen Over the next 90 days, patient will work with PharmD and provider towards optimized medication management  Interventions: Marland Kitchen Mailing BID pill box and list of medications/indications and recommended administration times.   Patient Self Care Activities:  . Patient will take medications as prescribed  Please see past updates related to this goal by clicking on the "Past Updates" button in the selected goal         The patient verbalized understanding of instructions provided today and agreed to receive a mailed copy of patient instruction and/or educational materials.  Plan:  - Will follow up as previously scheduled  Catie Darnelle Maffucci, PharmD, [place], [place] Pharmacist [place] [phone]

## 2020-08-12 ENCOUNTER — Telehealth: Payer: Self-pay | Admitting: Pharmacist

## 2020-08-12 ENCOUNTER — Ambulatory Visit: Payer: Medicare HMO | Admitting: Pharmacist

## 2020-08-12 DIAGNOSIS — I2510 Atherosclerotic heart disease of native coronary artery without angina pectoris: Secondary | ICD-10-CM | POA: Diagnosis not present

## 2020-08-12 DIAGNOSIS — I5030 Unspecified diastolic (congestive) heart failure: Secondary | ICD-10-CM

## 2020-08-12 DIAGNOSIS — E782 Mixed hyperlipidemia: Secondary | ICD-10-CM

## 2020-08-12 DIAGNOSIS — J431 Panlobular emphysema: Secondary | ICD-10-CM

## 2020-08-12 DIAGNOSIS — I4819 Other persistent atrial fibrillation: Secondary | ICD-10-CM | POA: Diagnosis not present

## 2020-08-12 DIAGNOSIS — I2583 Coronary atherosclerosis due to lipid rich plaque: Secondary | ICD-10-CM | POA: Diagnosis not present

## 2020-08-12 NOTE — Chronic Care Management (AMB) (Signed)
Chronic Care Management   Follow Up Note   08/12/2020 Name: Victor Daniels MRN: [id number] DOB: 11-Jan-1946  Referred by: Crecencio Mc, MD Reason for referral : Chronic Care Management (Medication Management)   Victor Daniels is a 74 y.o. year old male who is a primary care patient of Tullo, Aris Everts, MD. The CCM team was consulted for assistance with chronic disease management and care coordination needs.    Received call from patient today with medication management question.  Review of patient status, including review of consultants reports, relevant laboratory and other test results, and collaboration with appropriate care team members and the patient's provider was performed as part of comprehensive patient evaluation and provision of chronic care management services.    SDOH (Social Determinants of Health) assessments performed: No See Care Plan activities for detailed interventions related to [facility])     Outpatient Encounter Medications as of 08/12/2020  Medication Sig Note  . albuterol (PROVENTIL) (2.5 MG/3ML) 0.083% nebulizer solution Take 3 mLs (2.5 mg total) by nebulization every 6 (six) hours as needed for wheezing or shortness of breath.   Marland Kitchen apixaban (ELIQUIS) 5 MG TABS tablet Take 1 tablet (5 mg total) by mouth 2 (two) times daily.   . Fluticasone-Umeclidin-Vilant (TRELEGY ELLIPTA) 100-62.5-25 MCG/INH AEPB Inhale 1 puff into the lungs daily.   . furosemide (LASIX) 40 MG tablet Take 1 tablet (40 mg total) by mouth 2 (two) times daily. 08/09/2020: Taking 20 mg QAM  . ipratropium (ATROVENT) 0.03 % nasal spray ipratropium bromide 21 mcg (0.03 %) nasal spray   . Ipratropium-Albuterol (COMBIVENT RESPIMAT) 20-100 MCG/ACT AERS respimat Inhale 1 puff into the lungs every 6 (six) hours as needed for wheezing. (Patient not taking: Reported on 08/09/2020)   . losartan (COZAAR) 25 MG tablet Take 1 tablet (25 mg total) by mouth daily.   . methocarbamol (ROBAXIN) 500 MG  tablet methocarbamol 500 mg tablet (Patient not taking: Reported on 08/09/2020)   . Multiple Vitamin (MULTIVITAMIN) capsule Take 1 capsule by mouth daily.   . nitroGLYCERIN (NITROSTAT) 0.4 MG SL tablet Place 1 tablet (0.4 mg total) under the tongue every 5 (five) minutes as needed for chest pain. Maximum dose 3 tablets (Patient not taking: Reported on 08/09/2020)   . omeprazole (PRILOSEC) 40 MG capsule omeprazole 40 mg capsule,delayed release (Patient not taking: Reported on 08/09/2020)   . oxyCODONE (ROXICODONE) 15 MG immediate release tablet Take 1 tablet (15 mg total) by mouth every 6 (six) hours as needed.   . OXYGEN Inhale 2 L into the lungs 3 (three) times daily as needed (shortness of breath).    . simvastatin (ZOCOR) 20 MG tablet Take 1 tablet (20 mg total) by mouth at bedtime.   Marland Kitchen spironolactone (ALDACTONE) 25 MG tablet Take 1 tablet (25 mg total) by mouth daily.   . tamsulosin (FLOMAX) 0.4 MG CAPS capsule Take 1 capsule (0.4 mg total) by mouth daily. (Patient not taking: Reported on 08/09/2020)   . VENTOLIN HFA 108 (90 Base) MCG/ACT inhaler INHALE 2 PUFFS INTO LUNGS EVERY 6 HOURS AS NEEDED FOR WHEEZING OR SHORTNESS OF BREATH   . vitamin B-12 (CYANOCOBALAMIN) 1000 MCG tablet Take 1,000 mcg by mouth daily.    No facility-administered encounter medications on file as of 08/12/2020.     Objective:   Goals Addressed              This Visit's Progress     Patient Stated   .  "My breathing is bad" (pt-stated)  CARE PLAN ENTRY (see longtitudinal plan of care for additional care plan information)  Current Barriers:  . Social, community, and financial barriers:  o Patient called to say he is interested in changing to a pharmacy that offers delivery . Last eGFR ~56 mL/min . Polypharmacy; complex patient with multiple comorbidities including COPD (hx tobacco abuse); atrial fibrillation, CHF (reduced but recovered, most recent 55%), CAD (hx MI, CVA x2), CKD (single kidney) o CHF:  Losartan 25 mg QPM, furosemide 20 mg QAM, spironolactone 25 mg QAM o Afib: recently started on Eliquis 5 mg BID  o ASCVD risk reduction: simvastatin 20 mg daily  o COPD: Trelegy daily, albuterol HFA or Duonebs TID-QID; oxygen 24 hours.  o Anemia w/ CKD: recommended to f/u with Dr. Candiss Norse. Needs to schedule. o Chronic pain: oxycodone 15 mg Q6H prn. PRN methocarbamol use, though using infrequently.  o Urinary symptoms: saw Dr. Bernardo Heater, took tamsulosin 0.4 mg daily x 30 days and did not report any benefit, so did not refill  Pharmacist Clinical Goal(s):  Marland Kitchen Over the next 90 days, patient will work with PharmD and provider towards optimized medication management  Interventions: . Reviewed in network pharmacies in [place] that offer delivery. Patient would not like to use [facility]. Provided contact information for [facility]. He confirms that he would like to use this pharmacy moving forward. Updated in [place]. He requested that [place] transfer all medications from [place]. Will fax updated medication list to [facility].  Patient Self Care Activities:  . Patient will take medications as prescribed  Please see past updates related to this goal by clicking on the "Past Updates" button in the selected goal      .  DIET - REDUCE SUGAR INTAKE (pt-stated)          Plan:  - Will outreach patient as previously scheduled  Catie Darnelle Maffucci, PharmD, [place], [place] Pharmacist [place] [phone]

## 2020-08-12 NOTE — Telephone Encounter (Signed)
Patient returned call. See CCM documentation 

## 2020-08-12 NOTE — Telephone Encounter (Signed)
  Chronic Care Management   Note  08/12/2020 Name: EBON KETCHUM MRN: [id number] DOB: [birth year]  Patient called and LVM that he is interested in transferring prescriptions from [facility] to a pharmacy that would offer delivery. He mentioned [facility].   Called patient back, LVM to give me a call and we could discuss which pharmacies in his area offer delivery.    Catie Feliz Beam, PharmD, [place], CPP Clinical Pharmacist [facility] [place] [place] [employer] [phone]

## 2020-08-12 NOTE — Patient Instructions (Signed)
Visit Information  Goals Addressed              This Visit's Progress     Patient Stated   .  "My breathing is bad" (pt-stated)        CARE PLAN ENTRY (see longtitudinal plan of care for additional care plan information)  Current Barriers:  . Social, community, and financial barriers:  o Patient called to say he is interested in changing to a pharmacy that offers delivery . Last eGFR ~56 mL/min . Polypharmacy; complex patient with multiple comorbidities including COPD (hx tobacco abuse); atrial fibrillation, CHF (reduced but recovered, most recent 55%), CAD (hx MI, CVA x2), CKD (single kidney) o CHF: Losartan 25 mg QPM, furosemide 20 mg QAM, spironolactone 25 mg QAM o Afib: recently started on Eliquis 5 mg BID  o ASCVD risk reduction: simvastatin 20 mg daily  o COPD: Trelegy daily, albuterol HFA or Duonebs TID-QID; oxygen 24 hours.  o Anemia w/ CKD: recommended to f/u with Dr. Singh. Needs to schedule. o Chronic pain: oxycodone 15 mg Q6H prn. PRN methocarbamol use, though using infrequently.  o Urinary symptoms: saw Dr. Stoioff, took tamsulosin 0.4 mg daily x 30 days and did not report any benefit, so did not refill  Pharmacist Clinical Goal(s):  . Over the next 90 days, patient will work with PharmD and provider towards optimized medication management  Interventions: . Reviewed in network pharmacies in [place] that offer delivery. Patient would not like to use [facility]. Provided contact information for [facility]. He confirms that he would like to use this pharmacy moving forward. Updated in Epic. He requested that [facility] transfer all medications from [facility]. Will fax updated medication list to [facility].  Patient Self Care Activities:  . Patient will take medications as prescribed  Please see past updates related to this goal by clicking on the "Past Updates" button in the selected goal      .  DIET - REDUCE SUGAR INTAKE (pt-stated)         The patient  verbalized understanding of instructions provided today and declined a print copy of patient instruction materials.   Plan:  - Will outreach patient as previously scheduled  Catie , PharmD, BCACP, CPP Clinical Pharmacist  [facility] [facility] [phone]  

## 2020-08-16 ENCOUNTER — Ambulatory Visit: Payer: Self-pay | Admitting: Pharmacist

## 2020-08-16 DIAGNOSIS — I5030 Unspecified diastolic (congestive) heart failure: Secondary | ICD-10-CM

## 2020-08-16 MED ORDER — LOSARTAN POTASSIUM 25 MG PO TABS: 25.0000 mg | ORAL_TABLET | Freq: Every day | ORAL | 3 refills | Status: DC

## 2020-08-16 NOTE — Patient Instructions (Signed)
Visit Information  Goals Addressed              This Visit's Progress     Patient Stated   .  "My breathing is bad" (pt-stated)        CARE PLAN ENTRY (see longtitudinal plan of care for additional care plan information)  Current Barriers:  . Social, community, and financial barriers:  o Patient called to say that he needs a refill on losartan, and that he found out [facility] delivers, so he doesn't need to transfer to [facility] . Last eGFR ~56 mL/min . Polypharmacy; complex patient with multiple comorbidities including COPD (hx tobacco abuse); atrial fibrillation, CHF (reduced but recovered, most recent 55%), CAD (hx MI, CVA x2), CKD (single kidney) o CHF: Losartan 25 mg QPM, furosemide 20 mg QAM, spironolactone 25 mg QAM o Afib: recently started on Eliquis 5 mg BID  o ASCVD risk reduction: simvastatin 20 mg daily  o COPD: Trelegy daily, albuterol HFA or Duonebs TID-QID; oxygen 24 hours.  o Anemia w/ CKD: recommended to f/u with Dr. Candiss Norse. Needs to schedule. o Chronic pain: oxycodone 15 mg Q6H prn. PRN methocarbamol use, though using infrequently.  o Urinary symptoms: saw Dr. Bernardo Heater, took tamsulosin 0.4 mg daily x 30 days and did not report any benefit, so did not refill  Pharmacist Clinical Goal(s):  Marland Kitchen Over the next 90 days, patient will work with PharmD and provider towards optimized medication management  Interventions: . Sent refill on losartan. Removed [employer] from list of pharmacies   Patient Self Care Activities:  . Patient will take medications as prescribed  Please see past updates related to this goal by clicking on the "Past Updates" button in the selected goal         The patient verbalized understanding of instructions provided today and declined a print copy of patient instruction materials.   Plan:  - Will f/u later this week as previously scheduled  Catie Darnelle Maffucci, PharmD, [place], [place] Pharmacist [place] [phone]

## 2020-08-16 NOTE — Chronic Care Management (AMB) (Signed)
Chronic Care Management   Follow Up Note   08/16/2020 Name: Victor Daniels MRN: [id number] DOB: 01/10/1946  Referred by: Crecencio Mc, MD Reason for referral : Chronic Care Management (Medication Management)   Victor Daniels is a 74 y.o. year old male who is a primary care patient of Tullo, Aris Everts, MD. The CCM team was consulted for assistance with chronic disease management and care coordination needs.    Received call from patient today regarding medication management needs.  Review of patient status, including review of consultants reports, relevant laboratory and other test results, and collaboration with appropriate care team members and the patient's provider was performed as part of comprehensive patient evaluation and provision of chronic care management services.    SDOH (Social Determinants of Health) assessments performed: No See Care Plan activities for detailed interventions related to [facility])     Outpatient Encounter Medications as of 08/16/2020  Medication Sig Note  . albuterol (PROVENTIL) (2.5 MG/3ML) 0.083% nebulizer solution Take 3 mLs (2.5 mg total) by nebulization every 6 (six) hours as needed for wheezing or shortness of breath.   Marland Kitchen apixaban (ELIQUIS) 5 MG TABS tablet Take 1 tablet (5 mg total) by mouth 2 (two) times daily.   . Fluticasone-Umeclidin-Vilant (TRELEGY ELLIPTA) 100-62.5-25 MCG/INH AEPB Inhale 1 puff into the lungs daily.   . furosemide (LASIX) 40 MG tablet Take 1 tablet (40 mg total) by mouth 2 (two) times daily. 08/09/2020: Taking 20 mg QAM  . ipratropium (ATROVENT) 0.03 % nasal spray ipratropium bromide 21 mcg (0.03 %) nasal spray   . Ipratropium-Albuterol (COMBIVENT RESPIMAT) 20-100 MCG/ACT AERS respimat Inhale 1 puff into the lungs every 6 (six) hours as needed for wheezing. (Patient not taking: Reported on 08/09/2020)   . losartan (COZAAR) 25 MG tablet Take 1 tablet (25 mg total) by mouth daily.   . methocarbamol (ROBAXIN) 500 MG  tablet methocarbamol 500 mg tablet (Patient not taking: Reported on 08/09/2020)   . Multiple Vitamin (MULTIVITAMIN) capsule Take 1 capsule by mouth daily.   . nitroGLYCERIN (NITROSTAT) 0.4 MG SL tablet Place 1 tablet (0.4 mg total) under the tongue every 5 (five) minutes as needed for chest pain. Maximum dose 3 tablets (Patient not taking: Reported on 08/09/2020)   . omeprazole (PRILOSEC) 40 MG capsule omeprazole 40 mg capsule,delayed release (Patient not taking: Reported on 08/09/2020)   . oxyCODONE (ROXICODONE) 15 MG immediate release tablet Take 1 tablet (15 mg total) by mouth every 6 (six) hours as needed.   . OXYGEN Inhale 2 L into the lungs 3 (three) times daily as needed (shortness of breath).    . simvastatin (ZOCOR) 20 MG tablet Take 1 tablet (20 mg total) by mouth at bedtime.   Marland Kitchen spironolactone (ALDACTONE) 25 MG tablet Take 1 tablet (25 mg total) by mouth daily.   . tamsulosin (FLOMAX) 0.4 MG CAPS capsule Take 1 capsule (0.4 mg total) by mouth daily. (Patient not taking: Reported on 08/09/2020)   . VENTOLIN HFA 108 (90 Base) MCG/ACT inhaler INHALE 2 PUFFS INTO LUNGS EVERY 6 HOURS AS NEEDED FOR WHEEZING OR SHORTNESS OF BREATH   . vitamin B-12 (CYANOCOBALAMIN) 1000 MCG tablet Take 1,000 mcg by mouth daily.   . [DISCONTINUED] losartan (COZAAR) 25 MG tablet Take 1 tablet (25 mg total) by mouth daily.    No facility-administered encounter medications on file as of 08/16/2020.     Objective:   Goals Addressed  This Visit's Progress     Patient Stated   .  "My breathing is bad" (pt-stated)        CARE PLAN ENTRY (see longtitudinal plan of care for additional care plan information)  Current Barriers:  . Social, community, and financial barriers:  o Patient called to say that he needs a refill on losartan, and that he found out [facility] delivers, so he doesn't need to transfer to [facility] . Last eGFR ~56 mL/min . Polypharmacy; complex patient with multiple  comorbidities including COPD (hx tobacco abuse); atrial fibrillation, CHF (reduced but recovered, most recent 55%), CAD (hx MI, CVA x2), CKD (single kidney) o CHF: Losartan 25 mg QPM, furosemide 20 mg QAM, spironolactone 25 mg QAM o Afib: recently started on Eliquis 5 mg BID  o ASCVD risk reduction: simvastatin 20 mg daily  o COPD: Trelegy daily, albuterol HFA or Duonebs TID-QID; oxygen 24 hours.  o Anemia w/ CKD: recommended to f/u with Dr. Candiss Norse. Needs to schedule. o Chronic pain: oxycodone 15 mg Q6H prn. PRN methocarbamol use, though using infrequently.  o Urinary symptoms: saw Dr. Bernardo Heater, took tamsulosin 0.4 mg daily x 30 days and did not report any benefit, so did not refill  Pharmacist Clinical Goal(s):  Marland Kitchen Over the next 90 days, patient will work with PharmD and provider towards optimized medication management  Interventions: . Sent refill on losartan. Removed [employer] from list of pharmacies   Patient Self Care Activities:  . Patient will take medications as prescribed  Please see past updates related to this goal by clicking on the "Past Updates" button in the selected goal          Plan:  - Will f/u later this week as previously scheduled  Catie Darnelle Maffucci, PharmD, [place], [place] Pharmacist [place] [phone]

## 2020-08-17 ENCOUNTER — Other Ambulatory Visit: Payer: Self-pay

## 2020-08-17 ENCOUNTER — Other Ambulatory Visit (INDEPENDENT_AMBULATORY_CARE_PROVIDER_SITE_OTHER): Payer: Medicare HMO

## 2020-08-17 DIAGNOSIS — N179 Acute kidney failure, unspecified: Secondary | ICD-10-CM

## 2020-08-17 LAB — BASIC METABOLIC PANEL
BUN: 20 mg/dL (ref 6–23)
CO2: 36 mEq/L — ABNORMAL HIGH (ref 19–32)
Calcium: 9.4 mg/dL (ref 8.4–10.5)
Chloride: 96 mEq/L (ref 96–112)
Creatinine, Ser: 1.33 mg/dL (ref 0.40–1.50)
GFR: 52.58 mL/min — ABNORMAL LOW (ref >60.00)
Glucose, Bld: 94 mg/dL (ref 70–99)
Potassium: 4.2 mEq/L (ref 3.5–5.1)
Sodium: 140 mEq/L (ref 135–145)
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
## 2020-08-19 ENCOUNTER — Ambulatory Visit (INDEPENDENT_AMBULATORY_CARE_PROVIDER_SITE_OTHER): Payer: Medicare HMO | Admitting: Urology

## 2020-08-19 ENCOUNTER — Ambulatory Visit: Payer: Medicare HMO | Admitting: Pharmacist

## 2020-08-19 ENCOUNTER — Other Ambulatory Visit: Payer: Self-pay

## 2020-08-19 ENCOUNTER — Encounter: Payer: Self-pay | Admitting: Urology

## 2020-08-19 VITALS — BP 130/80 | HR 74 | Ht 72.0 in | Wt 259.0 lb

## 2020-08-19 DIAGNOSIS — N401 Enlarged prostate with lower urinary tract symptoms: Secondary | ICD-10-CM

## 2020-08-19 DIAGNOSIS — E782 Mixed hyperlipidemia: Secondary | ICD-10-CM | POA: Diagnosis not present

## 2020-08-19 DIAGNOSIS — J431 Panlobular emphysema: Secondary | ICD-10-CM

## 2020-08-19 DIAGNOSIS — I5030 Unspecified diastolic (congestive) heart failure: Secondary | ICD-10-CM | POA: Diagnosis not present

## 2020-08-19 DIAGNOSIS — I4819 Other persistent atrial fibrillation: Secondary | ICD-10-CM | POA: Diagnosis not present

## 2020-08-19 DIAGNOSIS — I2583 Coronary atherosclerosis due to lipid rich plaque: Secondary | ICD-10-CM | POA: Diagnosis not present

## 2020-08-19 DIAGNOSIS — I2510 Atherosclerotic heart disease of native coronary artery without angina pectoris: Secondary | ICD-10-CM | POA: Diagnosis not present

## 2020-08-19 MED ORDER — SILODOSIN 8 MG PO CAPS
8.0000 mg | ORAL_CAPSULE | Freq: Every day | ORAL | 0 refills | Status: DC
Start: 2020-08-19 — End: 2020-08-23

## 2020-08-19 NOTE — Patient Instructions (Addendum)
Victor Daniels,   It was great talking with you today!  I am so proud of you for starting to use a pill box and getting connected with medication delivery. I think these two things are really going to help you keep everything straight with managing your medications.   Keep up the great work with tracking your weights. It will also be beneficial to keep track of your home blood pressure readings.   See your upcoming appointments for the phone call with Maudie Mercury (nurse case manager) and my phone call later in September.   As always, call me with any questions or concerns in the meantime.   GET YOUR COVID VACCINES.  Catie Darnelle Maffucci, PharmD [phone]  Visit Information  Goals Addressed              This Visit's Progress     Patient Stated   .  "My breathing is bad" (pt-stated)        CARE PLAN ENTRY (see longtitudinal plan of care for additional care plan information)  Current Barriers:  . Social, community, and financial barriers:  o Now receiving medications via delivery from [facility]. Patient is happy about this decision o Wonders where to get the COVID vaccine . Last eGFR ~52 mL/min . Polypharmacy; complex patient with multiple comorbidities including COPD (hx tobacco abuse); atrial fibrillation, CHF (reduced but recovered, most recent 55%), CAD (hx MI, CVA x2), CKD (single kidney) o CHF: Losartan 25 mg QPM, furosemide 20 mg QAM, spironolactone 25 mg QAM; f/u with Laurann Montana next week - Did report for BMP s/p spironolactone initiation. K at goal. Continue regimen. - Home weights remaining stable at 259-260 lbs o Afib: Eliquis 5 mg BID  o ASCVD risk reduction: simvastatin 20 mg daily  o COPD: Trelegy 100/62.5/25 mcg daily, albuterol HFA or Duonebs TID-QID; oxygen 24 hours.  o Anemia w/ CKD: recommended to f/u with Dr. Candiss Norse. Needs to schedule. Will f/u with this. o Chronic pain: oxycodone 15 mg Q6H prn. PRN methocarbamol use, though using infrequently.  o Urinary  symptoms: saw Dr. Bernardo Heater, took tamsulosin 0.4 mg daily x 30 days and did not report any benefit, so did not refill. Has f/u with him this afternoon.  Pharmacist Clinical Goal(s):  Marland Kitchen Over the next 90 days, patient will work with PharmD and provider towards optimized medication management  Interventions: . Comprehensive medication review performed, medication list updated in electronic medical record . Inter-disciplinary care team collaboration (see longitudinal plan of care) . Extensive counseling on importance of COVID vaccination. Recommended he call [facility] today to pursue vaccination. Patient verbalized understanding . Encouraged continued medication adherence and continued daily weights. Reviewed upcoming phone call from [place], encouraged continued collaboration  Patient Self Care Activities:  . Patient will take medications as prescribed  Please see past updates related to this goal by clicking on the "Past Updates" button in the selected goal         The patient verbalized understanding of instructions provided today and agreed to receive a mailed copy of patient instruction and/or educational materials.  Plan:  - Scheduled f/u call in ~ 5 weeks  Catie Darnelle Maffucci, PharmD, [place], [place] Pharmacist [place] [phone]

## 2020-08-19 NOTE — Chronic Care Management (AMB) (Signed)
Chronic Care Management   Follow Up Note   08/19/2020 Name: Victor Daniels MRN: [id number] DOB: 1946/06/08  Referred by: Crecencio Mc, MD Reason for referral : Chronic Care Management (Medication Management)   Victor Daniels is a 74 y.o. year old male who is a primary care patient of Tullo, Aris Everts, MD. The CCM team was consulted for assistance with chronic disease management and care coordination needs.    Contacted patient for medication management review.  Review of patient status, including review of consultants reports, relevant laboratory and other test results, and collaboration with appropriate care team members and the patient's provider was performed as part of comprehensive patient evaluation and provision of chronic care management services.    SDOH (Social Determinants of Health) assessments performed: Yes See Care Plan activities for detailed interventions related to SDOH)  SDOH Interventions     Most Recent Value  SDOH Interventions  Transportation Interventions Other (Comment)  [medication delivery]       Outpatient Encounter Medications as of 08/19/2020  Medication Sig Note   apixaban (ELIQUIS) 5 MG TABS tablet Take 1 tablet (5 mg total) by mouth 2 (two) times daily.    Fluticasone-Umeclidin-Vilant (TRELEGY ELLIPTA) 100-62.5-25 MCG/INH AEPB Inhale 1 puff into the lungs daily.    furosemide (LASIX) 40 MG tablet Take 1 tablet (40 mg total) by mouth 2 (two) times daily. 08/09/2020: Taking 20 mg QAM   ipratropium (ATROVENT) 0.03 % nasal spray ipratropium bromide 21 mcg (0.03 %) nasal spray    losartan (COZAAR) 25 MG tablet Take 1 tablet (25 mg total) by mouth daily.    Multiple Vitamin (MULTIVITAMIN) capsule Take 1 capsule by mouth daily.    simvastatin (ZOCOR) 20 MG tablet Take 1 tablet (20 mg total) by mouth at bedtime.    spironolactone (ALDACTONE) 25 MG tablet Take 1 tablet (25 mg total) by mouth daily.    albuterol (PROVENTIL) (2.5  MG/3ML) 0.083% nebulizer solution Take 3 mLs (2.5 mg total) by nebulization every 6 (six) hours as needed for wheezing or shortness of breath.    Ipratropium-Albuterol (COMBIVENT RESPIMAT) 20-100 MCG/ACT AERS respimat Inhale 1 puff into the lungs every 6 (six) hours as needed for wheezing. (Patient not taking: Reported on 08/19/2020)    methocarbamol (ROBAXIN) 500 MG tablet methocarbamol 500 mg tablet (Patient not taking: Reported on 08/09/2020)    nitroGLYCERIN (NITROSTAT) 0.4 MG SL tablet Place 1 tablet (0.4 mg total) under the tongue every 5 (five) minutes as needed for chest pain. Maximum dose 3 tablets (Patient not taking: Reported on 08/09/2020)    omeprazole (PRILOSEC) 40 MG capsule omeprazole 40 mg capsule,delayed release (Patient not taking: Reported on 08/09/2020)    oxyCODONE (ROXICODONE) 15 MG immediate release tablet Take 1 tablet (15 mg total) by mouth every 6 (six) hours as needed.    OXYGEN Inhale 2 L into the lungs 3 (three) times daily as needed (shortness of breath).     tamsulosin (FLOMAX) 0.4 MG CAPS capsule Take 1 capsule (0.4 mg total) by mouth daily. (Patient not taking: Reported on 08/09/2020)    VENTOLIN HFA 108 (90 Base) MCG/ACT inhaler INHALE 2 PUFFS INTO LUNGS EVERY 6 HOURS AS NEEDED FOR WHEEZING OR SHORTNESS OF BREATH    vitamin B-12 (CYANOCOBALAMIN) 1000 MCG tablet Take 1,000 mcg by mouth daily.    No facility-administered encounter medications on file as of 08/19/2020.     Objective:   Goals Addressed  This Visit's Progress     Patient Stated     "My breathing is bad" (pt-stated)        CARE PLAN ENTRY (see longtitudinal plan of care for additional care plan information)  Current Barriers:   Social, community, and financial barriers:  o Now receiving medications via delivery from [facility] [employer]. Patient is happy about this decision o Wonders where to get the COVID vaccine  Last eGFR ~52 mL/min  Polypharmacy; complex patient with  multiple comorbidities including COPD (hx tobacco abuse); atrial fibrillation, CHF (reduced but recovered, most recent 55%), CAD (hx MI, CVA x2), CKD (single kidney) o CHF: Losartan 25 mg QPM, furosemide 20 mg QAM, spironolactone 25 mg QAM; f/u with Laurann Montana next week - Did report for BMP s/p spironolactone initiation. K at goal. Continue regimen. - Home weights remaining stable at 259-260 lbs o Afib: Eliquis 5 mg BID  o ASCVD risk reduction: simvastatin 20 mg daily  o COPD: Trelegy 100/62.5/25 mcg daily, albuterol HFA or Duonebs TID-QID; oxygen 24 hours.  o Anemia w/ CKD: recommended to f/u with Dr. Candiss Norse. Needs to schedule. Will f/u with this. o Chronic pain: oxycodone 15 mg Q6H prn. PRN methocarbamol use, though using infrequently.  o Urinary symptoms: saw Dr. Bernardo Heater, took tamsulosin 0.4 mg daily x 30 days and did not report any benefit, so did not refill. Has f/u with him this afternoon.  Pharmacist Clinical Goal(s):   Over the next 90 days, patient will work with PharmD and provider towards optimized medication management  Interventions:  Comprehensive medication review performed, medication list updated in electronic medical record  Inter-disciplinary care team collaboration (see longitudinal plan of care)  Extensive counseling on importance of COVID vaccination. Recommended he call [facility] today to pursue vaccination. Patient verbalized understanding  Encouraged continued medication adherence and continued daily weights. Reviewed upcoming phone call from [place], encouraged continued collaboration  Patient Self Care Activities:   Patient will take medications as prescribed  Please see past updates related to this goal by clicking on the "Past Updates" button in the selected goal          Plan:  - Scheduled f/u call in ~ 5 weeks  Catie Darnelle Maffucci, PharmD, [place], [place] Pharmacist [place] [phone]

## 2020-08-20 ENCOUNTER — Other Ambulatory Visit: Payer: Self-pay | Admitting: *Deleted

## 2020-08-23 ENCOUNTER — Other Ambulatory Visit: Payer: Self-pay

## 2020-08-23 ENCOUNTER — Ambulatory Visit: Payer: Medicare HMO | Admitting: Family

## 2020-08-23 ENCOUNTER — Encounter: Payer: Self-pay | Admitting: Family

## 2020-08-23 VITALS — BP 150/80 | HR 71 | Ht 72.0 in | Wt 259.0 lb

## 2020-08-23 DIAGNOSIS — I5042 Chronic combined systolic (congestive) and diastolic (congestive) heart failure: Secondary | ICD-10-CM | POA: Diagnosis not present

## 2020-08-23 DIAGNOSIS — I4819 Other persistent atrial fibrillation: Secondary | ICD-10-CM

## 2020-08-23 DIAGNOSIS — I10 Essential (primary) hypertension: Secondary | ICD-10-CM | POA: Diagnosis not present

## 2020-08-23 DIAGNOSIS — Z7901 Long term (current) use of anticoagulants: Secondary | ICD-10-CM

## 2020-08-23 NOTE — Patient Instructions (Addendum)
Medication Instructions:  No medication changes today.   *If you need a refill on your cardiac medications before your next appointment, please call your pharmacy*   Lab Work: No lab work today.  If you have labs (blood work) drawn today and your tests are completely normal, you will receive your results only by: Marland Kitchen MyChart Message (if you have MyChart) OR . A paper copy in the mail If you have any lab test that is abnormal or we need to change your treatment, we will call you to review the results.   Testing/Procedures: Your EKG today shows rate controlled atrial fibrillation which is a stable finding.   Follow-Up: At [facility], you and your health needs are our priority.  As part of our continuing mission to provide you with exceptional heart care, we have created designated Provider Care Teams.  These Care Teams include your primary Cardiologist (physician) and Advanced Practice Providers (APPs -  Physician Assistants and Nurse Practitioners) who all work together to provide you with the care you need, when you need it.  We recommend signing up for the patient portal called "MyChart".  Sign up information is provided on this After Visit Summary.  MyChart is used to connect with patients for Virtual Visits (Telemedicine).  Patients are able to view lab/test results, encounter notes, upcoming appointments, etc.  Non-urgent messages can be sent to your provider as well.   To learn more about what you can do with MyChart, go to [email or web address].    Your next appointment:   In October with Dr. Mariah Milling as previously scheduled  Other Instructions   If you gain 2 pounds overnight or 5 pounds in one week, please call our office.   Please check your blood pressure twice per week and keep a log.  Blood Pressure Log   Date   Time  Blood Pressure  Position  Example: Nov 1 9 AM 124/78 sitting                                                    Low-Sodium  Eating Plan Sodium, which is an element that makes up salt, helps you maintain a healthy balance of fluids in your body. Too much sodium can increase your blood pressure and cause fluid and waste to be held in your body. Your health care provider or dietitian may recommend following this plan if you have high blood pressure (hypertension), kidney disease, liver disease, or heart failure. Eating less sodium can help lower your blood pressure, reduce swelling, and protect your heart, liver, and kidneys. What are tips for following this plan? General guidelines  Most people on this plan should limit their sodium intake to 1,500-2,000 mg (milligrams) of sodium each day. Reading food labels   The Nutrition Facts label lists the amount of sodium in one serving of the food. If you eat more than one serving, you must multiply the listed amount of sodium by the number of servings.  Choose foods with less than 140 mg of sodium per serving.  Avoid foods with 300 mg of sodium or more per serving. Shopping  Look for lower-sodium products, often labeled as "low-sodium" or "no salt added."  Always check the sodium content even if foods are labeled as "unsalted" or "no salt added".  Buy fresh foods. ? Avoid canned foods and premade  or frozen meals. ? Avoid canned, cured, or processed meats  Buy breads that have less than 80 mg of sodium per slice. Cooking  Eat more home-cooked food and less restaurant, buffet, and fast food.  Avoid adding salt when cooking. Use salt-free seasonings or herbs instead of table salt or sea salt. Check with your health care provider or pharmacist before using salt substitutes.  Cook with plant-based oils, such as canola, sunflower, or olive oil. Meal planning  When eating at a restaurant, ask that your food be prepared with less salt or no salt, if possible.  Avoid foods that contain MSG (monosodium glutamate). MSG is sometimes added to [place] food, bouillon, and  some canned foods. What foods are recommended? The items listed may not be a complete list. Talk with your dietitian about what dietary choices are best for you. Grains Low-sodium cereals, including oats, puffed wheat and rice, and shredded wheat. Low-sodium crackers. Unsalted rice. Unsalted pasta. Low-sodium bread. Whole-grain breads and whole-grain pasta. Vegetables Fresh or frozen vegetables. "No salt added" canned vegetables. "No salt added" tomato sauce and paste. Low-sodium or reduced-sodium tomato and vegetable juice. Fruits Fresh, frozen, or canned fruit. Fruit juice. Meats and other protein foods Fresh or frozen (no salt added) meat, poultry, seafood, and fish. Low-sodium canned tuna and salmon. Unsalted nuts. Dried peas, beans, and lentils without added salt. Unsalted canned beans. Eggs. Unsalted nut butters. Dairy Milk. Soy milk. Cheese that is naturally low in sodium, such as ricotta cheese, fresh mozzarella, or Swiss cheese Low-sodium or reduced-sodium cheese. Cream cheese. Yogurt. Fats and oils Unsalted butter. Unsalted margarine with no trans fat. Vegetable oils such as canola or olive oils. Seasonings and other foods Fresh and dried herbs and spices. Salt-free seasonings. Low-sodium mustard and ketchup. Sodium-free salad dressing. Sodium-free light mayonnaise. Fresh or refrigerated horseradish. Lemon juice. Vinegar. Homemade, reduced-sodium, or low-sodium soups. Unsalted popcorn and pretzels. Low-salt or salt-free chips. What foods are not recommended? The items listed may not be a complete list. Talk with your dietitian about what dietary choices are best for you. Grains Instant hot cereals. Bread stuffing, pancake, and biscuit mixes. Croutons. Seasoned rice or pasta mixes. Noodle soup cups. Boxed or frozen macaroni and cheese. Regular salted crackers. Self-rising flour. Vegetables Sauerkraut, pickled vegetables, and relishes. Olives. [place] fries. Onion rings. Regular canned  vegetables (not low-sodium or reduced-sodium). Regular canned tomato sauce and paste (not low-sodium or reduced-sodium). Regular tomato and vegetable juice (not low-sodium or reduced-sodium). Frozen vegetables in sauces. Meats and other protein foods Meat or fish that is salted, canned, smoked, spiced, or pickled. Bacon, ham, sausage, hotdogs, corned beef, chipped beef, packaged lunch meats, salt pork, jerky, pickled herring, anchovies, regular canned tuna, sardines, salted nuts. Dairy Processed cheese and cheese spreads. Cheese curds. Blue cheese. Feta cheese. String cheese. Regular cottage cheese. Buttermilk. Canned milk. Fats and oils Salted butter. Regular margarine. Ghee. Bacon fat. Seasonings and other foods Onion salt, garlic salt, seasoned salt, table salt, and sea salt. Canned and packaged gravies. Worcestershire sauce. Tartar sauce. Barbecue sauce. Teriyaki sauce. Soy sauce, including reduced-sodium. Steak sauce. Fish sauce. Oyster sauce. Cocktail sauce. Horseradish that you find on the shelf. Regular ketchup and mustard. Meat flavorings and tenderizers. Bouillon cubes. Hot sauce and Tabasco sauce. Premade or packaged marinades. Premade or packaged taco seasonings. Relishes. Regular salad dressings. Salsa. Potato and tortilla chips. Corn chips and puffs. Salted popcorn and pretzels. Canned or dried soups. Pizza. Frozen entrees and pot pies. Summary  Eating less sodium can help  lower your blood pressure, reduce swelling, and protect your heart, liver, and kidneys.  Most people on this plan should limit their sodium intake to 1,500-2,000 mg (milligrams) of sodium each day.  Canned, boxed, and frozen foods are high in sodium. Restaurant foods, fast foods, and pizza are also very high in sodium. You also get sodium by adding salt to food.  Try to cook at home, eat more fresh fruits and vegetables, and eat less fast food, canned, processed, or prepared foods. This information is not intended  to replace advice given to you by your health care provider. Make sure you discuss any questions you have with your health care provider. Document Revised: 11/23/2017 Document Reviewed: 12/04/2016 Elsevier Patient Education  2020 [employer].
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
## 2020-08-25 ENCOUNTER — Other Ambulatory Visit: Payer: Self-pay | Admitting: *Deleted

## 2020-08-25 ENCOUNTER — Ambulatory Visit: Payer: Medicare HMO | Admitting: Pharmacist

## 2020-08-25 DIAGNOSIS — I4819 Other persistent atrial fibrillation: Secondary | ICD-10-CM

## 2020-08-25 DIAGNOSIS — J431 Panlobular emphysema: Secondary | ICD-10-CM

## 2020-08-25 MED ORDER — TRELEGY ELLIPTA 100-62.5-25 MCG/INH IN AEPB: 1.0000 | INHALATION_SPRAY | Freq: Every day | RESPIRATORY_TRACT | 1 refills | Status: DC

## 2020-08-25 MED ORDER — APIXABAN 5 MG PO TABS: 5.0000 mg | ORAL_TABLET | Freq: Two times a day (BID) | ORAL | 1 refills | Status: DC

## 2020-08-25 NOTE — Patient Instructions (Signed)
Visit Information  Goals Addressed              This Visit's Progress     Patient Stated   .  "My breathing is bad" (pt-stated)        CARE PLAN ENTRY (see longtitudinal plan of care for additional care plan information)  Current Barriers:  . Social, community, and financial barriers:  o Calls today wondering about getting refills on Trelegy and Eliquis sent to [facility] order pharmacy.  o Reports "really liking" the pill box that was provided to him at our previous telephone calls. Likes that he knows ahead of time when he needs to refill medications, and when he's forgotten to take his meds.  o Missed phone call from RN CM today.  . Last eGFR ~52 mL/min . Polypharmacy; complex patient with multiple comorbidities including COPD (hx tobacco abuse); atrial fibrillation, CHF (reduced but recovered, most recent 55%), CAD (hx MI, CVA x2), CKD (single kidney) o CHF: Losartan 25 mg QPM, furosemide 20 mg QAM, spironolactone 25 mg QAM; f/u with Laurann Montana earlier this week went well o Afib: Eliquis 5 mg BID  o ASCVD risk reduction: simvastatin 20 mg daily  o COPD: Trelegy 100/62.5/25 mcg daily, albuterol HFA or Duonebs TID-QID; oxygen 24 hours.  o Anemia w/ CKD: recommended to f/u with Dr. Candiss Norse. o Chronic pain: oxycodone 15 mg Q6H prn. PRN methocarbamol use, though using infrequently.  o Urinary symptoms: Dr. Bernardo Heater, trial of silodosin  Pharmacist Clinical Goal(s):  Marland Kitchen Over the next 90 days, patient will work with PharmD and provider towards optimized medication management  Interventions: . Comprehensive medication review performed, medication list updated in electronic medical record . Inter-disciplinary care team collaboration (see longitudinal plan of care) . Due for Trelegy refill, and agree w/ sending Eliquis to mail order for long term use. Refills sent. Encouraged continued pill box use . Reviewed collaborative role of RN CM. Provided phone number, patient noted he was  going to give her a call.  Patient Self Care Activities:  . Patient will take medications as prescribed  Please see past updates related to this goal by clicking on the "Past Updates" button in the selected goal         The patient verbalized understanding of instructions provided today and declined a print copy of patient instruction materials.   Plan:  - Will follow up as previously scheduled  Catie Darnelle Maffucci, PharmD, [place], [place] Pharmacist [place] [phone]

## 2020-08-25 NOTE — Chronic Care Management (AMB) (Signed)
Chronic Care Management   Follow Up Note   08/25/2020 Name: Victor Daniels MRN: [id number] DOB: 1946-02-27  Referred by: Crecencio Mc, MD Reason for referral : Chronic Care Management (Medication Management)   Victor Daniels is a 74 y.o. year old male who is a primary care patient of Tullo, Aris Everts, MD. The CCM team was consulted for assistance with chronic disease management and care coordination needs.    Received call from patient today regarding medication access.   Review of patient status, including review of consultants reports, relevant laboratory and other test results, and collaboration with appropriate care team members and the patient's provider was performed as part of comprehensive patient evaluation and provision of chronic care management services.    SDOH (Social Determinants of Health) assessments performed: No See Care Plan activities for detailed interventions related to [facility])     Outpatient Encounter Medications as of 08/25/2020  Medication Sig  . albuterol (PROVENTIL) (2.5 MG/3ML) 0.083% nebulizer solution Take 3 mLs (2.5 mg total) by nebulization every 6 (six) hours as needed for wheezing or shortness of breath.  Marland Kitchen apixaban (ELIQUIS) 5 MG TABS tablet Take 1 tablet (5 mg total) by mouth 2 (two) times daily.  . Fluticasone-Umeclidin-Vilant (TRELEGY ELLIPTA) 100-62.5-25 MCG/INH AEPB Inhale 1 puff into the lungs daily.  . furosemide (LASIX) 40 MG tablet Take 1 tablet (40 mg total) by mouth 2 (two) times daily.  Marland Kitchen ipratropium (ATROVENT) 0.03 % nasal spray ipratropium bromide 21 mcg (0.03 %) nasal spray  . losartan (COZAAR) 25 MG tablet Take 1 tablet (25 mg total) by mouth daily.  . methocarbamol (ROBAXIN) 500 MG tablet methocarbamol 500 mg tablet  . Multiple Vitamin (MULTIVITAMIN) capsule Take 1 capsule by mouth daily.  . nitroGLYCERIN (NITROSTAT) 0.4 MG SL tablet Place 1 tablet (0.4 mg total) under the tongue every 5 (five) minutes as needed for chest  pain. Maximum dose 3 tablets  . oxyCODONE (ROXICODONE) 15 MG immediate release tablet Take 1 tablet (15 mg total) by mouth every 6 (six) hours as needed.  . OXYGEN Inhale 2 L into the lungs 3 (three) times daily as needed (shortness of breath).   . simvastatin (ZOCOR) 20 MG tablet Take 1 tablet (20 mg total) by mouth at bedtime.  Marland Kitchen spironolactone (ALDACTONE) 25 MG tablet Take 1 tablet (25 mg total) by mouth daily.  . VENTOLIN HFA 108 (90 Base) MCG/ACT inhaler INHALE 2 PUFFS INTO LUNGS EVERY 6 HOURS AS NEEDED FOR WHEEZING OR SHORTNESS OF BREATH  . vitamin B-12 (CYANOCOBALAMIN) 1000 MCG tablet Take 1,000 mcg by mouth daily.  . [DISCONTINUED] apixaban (ELIQUIS) 5 MG TABS tablet Take 1 tablet (5 mg total) by mouth 2 (two) times daily.  . [DISCONTINUED] Fluticasone-Umeclidin-Vilant (TRELEGY ELLIPTA) 100-62.5-25 MCG/INH AEPB Inhale 1 puff into the lungs daily.   No facility-administered encounter medications on file as of 08/25/2020.     Objective:   Goals Addressed              This Visit's Progress     Patient Stated   .  "My breathing is bad" (pt-stated)        CARE PLAN ENTRY (see longtitudinal plan of care for additional care plan information)  Current Barriers:  . Social, community, and financial barriers:  o Calls today wondering about getting refills on Trelegy and Eliquis sent to [facility] order pharmacy.  o Reports "really liking" the pill box that was provided to him at our previous telephone calls. Likes that  he knows ahead of time when he needs to refill medications, and when he's forgotten to take his meds.  o Missed phone call from RN CM today.  . Last eGFR ~52 mL/min . Polypharmacy; complex patient with multiple comorbidities including COPD (hx tobacco abuse); atrial fibrillation, CHF (reduced but recovered, most recent 55%), CAD (hx MI, CVA x2), CKD (single kidney) o CHF: Losartan 25 mg QPM, furosemide 20 mg QAM, spironolactone 25 mg QAM; f/u with Laurann Montana earlier  this week went well o Afib: Eliquis 5 mg BID  o ASCVD risk reduction: simvastatin 20 mg daily  o COPD: Trelegy 100/62.5/25 mcg daily, albuterol HFA or Duonebs TID-QID; oxygen 24 hours.  o Anemia w/ CKD: recommended to f/u with Dr. Candiss Norse. o Chronic pain: oxycodone 15 mg Q6H prn. PRN methocarbamol use, though using infrequently.  o Urinary symptoms: Dr. Bernardo Heater, trial of silodosin  Pharmacist Clinical Goal(s):  Marland Kitchen Over the next 90 days, patient will work with PharmD and provider towards optimized medication management  Interventions: . Comprehensive medication review performed, medication list updated in electronic medical record . Inter-disciplinary care team collaboration (see longitudinal plan of care) . Due for Trelegy refill, and agree w/ sending Eliquis to mail order for long term use. Refills sent. Encouraged continued pill box use . Reviewed collaborative role of RN CM. Provided phone number, patient noted he was going to give her a call.  Patient Self Care Activities:  . Patient will take medications as prescribed  Please see past updates related to this goal by clicking on the "Past Updates" button in the selected goal          Plan:  - Will follow up as previously scheduled  Catie Darnelle Maffucci, PharmD, [place], [place] Pharmacist [place] [phone]

## 2020-08-25 NOTE — Patient Outreach (Signed)
[facility]) Care Management  [facility] Care Manager  08/26/2020   Victor Daniels August 22, 1946 [id number]   Telephone Assessment   Date of Admission :07/29/20 Diagnosis :Acute on Chronic Diastolic heart failure Date of Discharge:08/03/20 Facility :[facility] Insurance:[employer] Transition of care by PCP   Recent Hospital Admission : [facility] 6/28-7/2 Acute on Chronic Diastolic heart failure  [facility] Admission 7/30-8/5 Acute on Chronic Diastolic heart failure [facility] ED visit 07/03/20 Dx: Chest Pain, bronchitis [facility] : 8/5-8/10  PMHx:73 y.o.malewith medical history significant ofhypertension, hyperlipidemia COPD, on 2 L oxygen, stroke, PAD, OSA, CAD, DVT atrial fibrillation on Eliquis,dCHF, alcoholic gastritis,BPH.  Subjective:  Patient returned call for earlier scheduled call on today. He states that he is doing good. States his breathing is at his usual no worse.  He discussed continuing to maintain his weight , discussing his weight loss over the last month.He reports being able to cross his legs, put his own clothes on . He discussed that he still occasionally eats pizza, but his wife does not allow him to add salt to other foods.   He reports continued decrease in swelling of his legs,no weeping but skin of legs feel a little rough, he plans to scheduled follow up with wound center.  He reports getting compression hose through [employer] in [place] but now they won't stay up.    Encounter Medications:  Outpatient Encounter Medications as of 08/25/2020  Medication Sig  . albuterol (PROVENTIL) (2.5 MG/3ML) 0.083% nebulizer solution Take 3 mLs (2.5 mg total) by nebulization every 6 (six) hours as needed for wheezing or shortness of breath.  Marland Kitchen apixaban (ELIQUIS) 5 MG TABS tablet Take 1 tablet (5 mg total) by mouth 2 (two) times daily.  . Fluticasone-Umeclidin-Vilant (TRELEGY ELLIPTA) 100-62.5-25 MCG/INH AEPB Inhale 1 puff into the lungs  daily.  . furosemide (LASIX) 40 MG tablet Take 1 tablet (40 mg total) by mouth 2 (two) times daily.  Marland Kitchen ipratropium (ATROVENT) 0.03 % nasal spray ipratropium bromide 21 mcg (0.03 %) nasal spray  . losartan (COZAAR) 25 MG tablet Take 1 tablet (25 mg total) by mouth daily.  . methocarbamol (ROBAXIN) 500 MG tablet methocarbamol 500 mg tablet  . Multiple Vitamin (MULTIVITAMIN) capsule Take 1 capsule by mouth daily.  . nitroGLYCERIN (NITROSTAT) 0.4 MG SL tablet Place 1 tablet (0.4 mg total) under the tongue every 5 (five) minutes as needed for chest pain. Maximum dose 3 tablets  . oxyCODONE (ROXICODONE) 15 MG immediate release tablet Take 1 tablet (15 mg total) by mouth every 6 (six) hours as needed.  . OXYGEN Inhale 2 L into the lungs 3 (three) times daily as needed (shortness of breath).   . simvastatin (ZOCOR) 20 MG tablet Take 1 tablet (20 mg total) by mouth at bedtime.  Marland Kitchen spironolactone (ALDACTONE) 25 MG tablet Take 1 tablet (25 mg total) by mouth daily.  . VENTOLIN HFA 108 (90 Base) MCG/ACT inhaler INHALE 2 PUFFS INTO LUNGS EVERY 6 HOURS AS NEEDED FOR WHEEZING OR SHORTNESS OF BREATH  . vitamin B-12 (CYANOCOBALAMIN) 1000 MCG tablet Take 1,000 mcg by mouth daily.   No facility-administered encounter medications on file as of 08/25/2020.    Functional Status:  In your present state of health, do you have any difficulty performing the following activities: 08/09/2020 07/28/2020  Hearing? N N  Vision? N N  Difficulty concentrating or making decisions? N N  Walking or climbing stairs? [name]  Comment uses walker uses walker  Dressing or bathing? [name]  Comment wife assist at times wife helps  Doing errands, shopping? [name]  Comment wife assist, patient does not drive wife assists  Quarry manager and eating ? N -  Using the Toilet? N -  In the past six months, have you accidently leaked urine? Y -  Comment wears depends -  Do you have problems with loss of bowel control? N -  Managing your Medications?  Y -  Comment wife assist at time , [facility] embedded pharmacist -  Managing your Finances? N -  Housekeeping or managing your Housekeeping? Y -  Comment wife does household management -  Some recent data might be hidden    Fall/Depression Screening: Fall Risk  08/09/2020 08/03/2020 07/06/2020  Falls in the past year? 1 1 1   Number falls in past yr: 0 0 1  Injury with Fall? 0 0 0  Risk for fall due to : Impaired balance/gait - History of fall(s);Impaired balance/gait  Follow up Falls prevention discussed Falls evaluation completed Falls prevention discussed   PHQ 2/9 Scores 08/09/2020 08/03/2020 06/29/2020 03/08/2020 05/01/2019 03/06/2019 07/23/2018  PHQ - 2 Score 0 0 1 0 0 0 0  PHQ- 9 Score - - - - 0 - -    Assessment:   Heart Failure  No worsening symptoms of heart failure. Continue improvement with self care, tolerating mobility in home . Will benefit from continued education and support on managing heart failure, limiting salt in diet and fluids as recommended less than 2 liters a day and 2 grams salt .   COPD No worsening symptoms , communication with [facility] pharmacist regarding refill on Trelegy.    Plan:  Will plan follow up call in the next 2 weeks.  Will send [facility] high low salt handout, exercise program book and fluid content of drinks as available.   [facility] CM Care Plan Problem One     Most Recent Value  Care Plan Problem One At risk for readmission related to recent disccharge due to diagnosis of heart faiilure, less than 30 day readmission    Role Documenting the Problem One Care Management Telephonic Coordinator  Care Plan for Problem One Active  [facility] Long Term Goal  Patient will not report hospital readmission over the next 30 days   [facility] Long Term Goal Start Date 08/09/20  Interventions for Problem One Long Term Goal Review of current clinical state, reinforced continued taking medications as prescribed, notifying MD sooner of new concerns worsening symptoms of shortness of breath,  swelling, sudden weight gain parameters of concern. Reviewed recommended limits in sodium 2 grams, and less than 2 liters. Will send high low salt handout and follow up review.     [facility] CM Short Term Goal #1  Over the next 30 days patient will be able to report monitoring and keeping a log on daily weights   [facility] CM Short Term Goal #1 Start Date 08/09/20  Interventions for Short Term Goal #1 Commended patient on report of continued weight monitoring.   [facility] CM Short Term Goal #2  Over the next 30 days patient will attend all medical appointments   [facility] CM Short Term Goal #2 Start Date 08/09/20  Interventions for Short Term Goal #2 Discussed recent follow up visit with cardiology, and next visits   [facility] CM Short Term Goal #3 Patient will be able to verbalize 3 signs & symptoms in yellow zone Heart failure to report to MD over the next 30 days   [facility] CM Short Term  Goal #3 Start Date 08/09/20  Staci Righter restart ]  Interventions for Short Tern Goal #3 Reviewed current zone symptoms, and action plan with teachback,   [facility] CM Short Term Goal #4 Over the next 30 days patient will report walking at least 10 minutes 4 days a week.   [facility] CM Short Term Goal #4 Start Date 08/09/20  Interventions for Short Term Goal #4 Reinforced continued benefit of being active as tolerated, being cautious regarding avoiding the heat . Discussed activity of chair exercise such as marching in place while seated, ambulation in home, discussed breaking up activity through out the day not all at one time. Will send [facility] exercise book to focus on seated exercise.        [name], RN, BSN  [facility] Care Management,Care Management Coordinator  [phone]- Mobile [phone]- Toll Free Main Office

## 2020-08-25 NOTE — Patient Outreach (Signed)
[facility]) Care Management  [facility] Care Manager  08/25/2020   ADDIS TUOHY 1946-04-15 [id number]   Telephone Assessment   Date of Admission :07/29/20 Diagnosis :Acute on Chronic Diastolic heart failure Date of Discharge:08/03/20 Facility :[facility] Insurance:[employer] Transition of care by PCP   Recent Hospital Admission : [facility] 6/28-7/2 Acute on Chronic Diastolic heart failure  [facility] Admission 7/30-8/5 Acute on Chronic Diastolic heart failure [facility] ED visit 07/03/20 Dx: Chest Pain, bronchitis [facility] : 8/5-8/10  PMHx:74 y.o.malewith medical history significant ofhypertension, hyperlipidemia COPD, on 2 L oxygen, stroke, PAD, OSA, CAD, DVT atrial fibrillation on Eliquis,dCHF, alcoholic gastritis,BPH.  Subjective: Unsuccessful outreach call to patient , no answer able to leave a HIPAA compliant voice mail message for return call.    Plan Will plan return call attempt in the next 3 to 4 business days.    [name], RN, BSN  [facility] Care Management,Care Management Coordinator  [phone]- Mobile [phone]- Toll Free Main Office

## 2020-08-27 ENCOUNTER — Ambulatory Visit (INDEPENDENT_AMBULATORY_CARE_PROVIDER_SITE_OTHER): Payer: Medicare HMO | Admitting: Pharmacist

## 2020-08-27 ENCOUNTER — Ambulatory Visit: Payer: Self-pay | Admitting: *Deleted

## 2020-08-27 DIAGNOSIS — I4819 Other persistent atrial fibrillation: Secondary | ICD-10-CM

## 2020-08-27 DIAGNOSIS — J431 Panlobular emphysema: Secondary | ICD-10-CM

## 2020-08-27 NOTE — Patient Instructions (Signed)
Visit Information  Goals Addressed              This Visit's Progress     Patient Stated   .  "My breathing is bad" (pt-stated)        CARE PLAN ENTRY (see longtitudinal plan of care for additional care plan information)  Current Barriers:  . Social, community, and financial barriers:  o Patient called today reporting that [facility] order was going to charge >$800 for 90 day supplies of Eliquis and Trelegy. Wonders about alternatives.  . Last eGFR ~52 mL/min . Polypharmacy; complex patient with multiple comorbidities including COPD (hx tobacco abuse); atrial fibrillation, CHF (reduced but recovered, most recent 55%), CAD (hx MI, CVA x2), CKD (single kidney) o CHF: Losartan 25 mg QPM, furosemide 20 mg QAM, spironolactone 25 mg QAM;  o Afib: Eliquis 5 mg BID  o ASCVD risk reduction: simvastatin 20 mg daily  o COPD: Trelegy 100/62.5/25 mcg daily, albuterol HFA or Duonebs TID-QID; oxygen 24 hours.  o Anemia w/ CKD: recommended to f/u with Dr. Candiss Norse. o Chronic pain: oxycodone 15 mg Q6H prn. PRN methocarbamol use, though using infrequently.  o Urinary symptoms: Dr. Bernardo Heater, trial of silodosin  Pharmacist Clinical Goal(s):  Marland Kitchen Over the next 90 days, patient will work with PharmD and provider towards optimized medication management  Interventions: . Comprehensive medication review performed, medication list updated in electronic medical record . Inter-disciplinary care team collaboration (see longitudinal plan of care) . Reviewed patient's income. He is over income for any inhaler or DOAC assistance programs. [facility] does offer a program for Xarelto where patients can pay $85 cash through this program for mail order Xarelto from [employer]. Will outreach cardiology team to see if they are amenable to this transition.  . Reviewed that no other appropriate substitution inhalers would be any cheaper. Recommended he continue to take Trelegy daily as prescribed. Reviewed that cost of  care for COPD exacerbation would likely be more expensive and detrimental than copay for Trelegy. Encouraged continued adherence. Will investigate if any other options, including samples.   Patient Self Care Activities:  . Patient will take medications as prescribed  Please see past updates related to this goal by clicking on the "Past Updates" button in the selected goal         The patient verbalized understanding of instructions provided today and declined a print copy of patient instruction materials.  Plan:  - Will f/u with patient next week once I hear back from cardiology team.   Catie Darnelle Maffucci, PharmD, BCACP, [place] [phone]

## 2020-08-27 NOTE — Chronic Care Management (AMB) (Signed)
Chronic Care Management   Follow Up Note   08/27/2020 Name: MOSES ODOHERTY MRN: [id number] DOB: 01-19-1946  Referred by: Crecencio Mc, MD Reason for referral : Chronic Care Management (Medication Management)   AZARIUS LAMBSON is a 74 y.o. year old male who is a primary care patient of Tullo, Aris Everts, MD. The CCM team was consulted for assistance with chronic disease management and care coordination needs.    Received call from patient with medication access concerns.   Review of patient status, including review of consultants reports, relevant laboratory and other test results, and collaboration with appropriate care team members and the patient's provider was performed as part of comprehensive patient evaluation and provision of chronic care management services.    SDOH (Social Determinants of Health) assessments performed: Yes See Care Plan activities for detailed interventions related to SDOH)  SDOH Interventions     Most Recent Value  SDOH Interventions  Financial Strain Interventions Other (Comment)  [medication assistance evaluation]       Outpatient Encounter Medications as of 08/27/2020  Medication Sig  . albuterol (PROVENTIL) (2.5 MG/3ML) 0.083% nebulizer solution Take 3 mLs (2.5 mg total) by nebulization every 6 (six) hours as needed for wheezing or shortness of breath.  Marland Kitchen apixaban (ELIQUIS) 5 MG TABS tablet Take 1 tablet (5 mg total) by mouth 2 (two) times daily.  . Fluticasone-Umeclidin-Vilant (TRELEGY ELLIPTA) 100-62.5-25 MCG/INH AEPB Inhale 1 puff into the lungs daily.  . furosemide (LASIX) 40 MG tablet Take 1 tablet (40 mg total) by mouth 2 (two) times daily.  Marland Kitchen ipratropium (ATROVENT) 0.03 % nasal spray ipratropium bromide 21 mcg (0.03 %) nasal spray  . losartan (COZAAR) 25 MG tablet Take 1 tablet (25 mg total) by mouth daily.  . methocarbamol (ROBAXIN) 500 MG tablet methocarbamol 500 mg tablet  . Multiple Vitamin (MULTIVITAMIN) capsule Take 1 capsule  by mouth daily.  . nitroGLYCERIN (NITROSTAT) 0.4 MG SL tablet Place 1 tablet (0.4 mg total) under the tongue every 5 (five) minutes as needed for chest pain. Maximum dose 3 tablets  . oxyCODONE (ROXICODONE) 15 MG immediate release tablet Take 1 tablet (15 mg total) by mouth every 6 (six) hours as needed.  . OXYGEN Inhale 2 L into the lungs 3 (three) times daily as needed (shortness of breath).   . simvastatin (ZOCOR) 20 MG tablet Take 1 tablet (20 mg total) by mouth at bedtime.  Marland Kitchen spironolactone (ALDACTONE) 25 MG tablet Take 1 tablet (25 mg total) by mouth daily.  . VENTOLIN HFA 108 (90 Base) MCG/ACT inhaler INHALE 2 PUFFS INTO LUNGS EVERY 6 HOURS AS NEEDED FOR WHEEZING OR SHORTNESS OF BREATH  . vitamin B-12 (CYANOCOBALAMIN) 1000 MCG tablet Take 1,000 mcg by mouth daily.   No facility-administered encounter medications on file as of 08/27/2020.     Objective:   Goals Addressed              This Visit's Progress     Patient Stated   .  "My breathing is bad" (pt-stated)        CARE PLAN ENTRY (see longtitudinal plan of care for additional care plan information)  Current Barriers:  . Social, community, and financial barriers:  o Patient called today reporting that [employer] order was going to charge >$800 for 90 day supplies of Eliquis and Trelegy. Wonders about alternatives.  . Last eGFR ~52 mL/min . Polypharmacy; complex patient with multiple comorbidities including COPD (hx tobacco abuse); atrial fibrillation, CHF (reduced but recovered,  most recent 55%), CAD (hx MI, CVA x2), CKD (single kidney) o CHF: Losartan 25 mg QPM, furosemide 20 mg QAM, spironolactone 25 mg QAM;  o Afib: Eliquis 5 mg BID  o ASCVD risk reduction: simvastatin 20 mg daily  o COPD: Trelegy 100/62.5/25 mcg daily, albuterol HFA or Duonebs TID-QID; oxygen 24 hours.  o Anemia w/ CKD: recommended to f/u with Dr. Candiss Norse. o Chronic pain: oxycodone 15 mg Q6H prn. PRN methocarbamol use, though using infrequently.   o Urinary symptoms: Dr. Bernardo Heater, trial of silodosin  Pharmacist Clinical Goal(s):  Marland Kitchen Over the next 90 days, patient will work with PharmD and provider towards optimized medication management  Interventions: . Comprehensive medication review performed, medication list updated in electronic medical record . Inter-disciplinary care team collaboration (see longitudinal plan of care) . Reviewed patient's income. He is over income for any inhaler or DOAC assistance programs. [facility] does offer a program for Xarelto where patients can pay $85 cash through this program for mail order Xarelto from [employer]. Will outreach cardiology team to see if they are amenable to this transition.  . Reviewed that no other appropriate substitution inhalers would be any cheaper. Recommended he continue to take Trelegy daily as prescribed. Reviewed that cost of care for COPD exacerbation would likely be more expensive and detrimental than copay for Trelegy. Encouraged continued adherence. Will investigate if any other options, including samples.   Patient Self Care Activities:  . Patient will take medications as prescribed  Please see past updates related to this goal by clicking on the "Past Updates" button in the selected goal          Plan:  - Will f/u with patient next week once I hear back from cardiology team.   Catie Darnelle Maffucci, PharmD, [place], [place] Pharmacist [place] [phone]

## 2020-08-31 ENCOUNTER — Ambulatory Visit: Payer: Medicare HMO | Admitting: Pharmacist

## 2020-08-31 DIAGNOSIS — J431 Panlobular emphysema: Secondary | ICD-10-CM

## 2020-08-31 DIAGNOSIS — I4819 Other persistent atrial fibrillation: Secondary | ICD-10-CM

## 2020-08-31 NOTE — Patient Instructions (Signed)
Visit Information  Goals Addressed              This Visit's Progress     Patient Stated     "My breathing is bad" (pt-stated)        CARE PLAN ENTRY (see longtitudinal plan of care for additional care plan information)  Current Barriers:   Social, community, and financial barriers:  o Patient in [place]. Working on solutions for affordability. Heard back from cardiology team that switch from Eliquis to Xarelto through [place] select program would be an appropriate substitution.   Last eGFR ~52 mL/min  Polypharmacy; complex patient with multiple comorbidities including COPD (hx tobacco abuse); atrial fibrillation, CHF (reduced but recovered, most recent 55%), CAD (hx MI, CVA x2), CKD (single kidney) o CHF: Losartan 25 mg QPM, furosemide 20 mg QAM, spironolactone 25 mg QAM;  o Afib: Eliquis 5 mg BID; plan to transition  o ASCVD risk reduction: simvastatin 20 mg daily  o COPD: Trelegy 100/62.5/25 mcg daily, albuterol HFA or Duonebs TID-QID; oxygen 24 hours.  o Anemia w/ CKD: recommended to f/u with Dr. Candiss Norse. o Chronic pain: oxycodone 15 mg Q6H prn. PRN methocarbamol use, though using infrequently.  o Urinary symptoms: Dr. Bernardo Heater, trial of silodosin  Pharmacist Clinical Goal(s):   Over the next 90 days, patient will work with PharmD and provider towards optimized medication management  Interventions:  Comprehensive medication review performed, medication list updated in electronic medical record  Inter-disciplinary care team collaboration (see longitudinal plan of care)  Provided phone number for [facility] select program for Xarelto 20 mg daily to patient. He verbalized understanding. He will call me with any questions or concerns. Cardiology team aware to be on the lookout for faxed prescription portion from [place] [facility]  Patient Self Care Activities:   Patient will take medications as prescribed  Please see past updates related to this goal  by clicking on the "Past Updates" button in the selected goal         The patient verbalized understanding of instructions provided today and declined a print copy of patient instruction materials.   Plan:  - Will f/u as previously scheduled  Catie Darnelle Maffucci, PharmD, [place], [place] Pharmacist [place] [phone]

## 2020-08-31 NOTE — Chronic Care Management (AMB) (Signed)
Chronic Care Management   Follow Up Note   08/31/2020 Name: Victor Daniels MRN: [id number] DOB: March 16, 1946  Referred by: Crecencio Mc, MD Reason for referral : Chronic Care Management (Medication Management)   Victor Daniels is a 74 y.o. year old male who is a primary care patient of Tullo, Aris Everts, MD. The CCM team was consulted for assistance with chronic disease management and care coordination needs.    Contacted patient to f/u on medication acces plan from last week  Review of patient status, including review of consultants reports, relevant laboratory and other test results, and collaboration with appropriate care team members and the patient's provider was performed as part of comprehensive patient evaluation and provision of chronic care management services.    SDOH (Social Determinants of Health) assessments performed: No See Care Plan activities for detailed interventions related to [facility])     Outpatient Encounter Medications as of 08/31/2020  Medication Sig  . albuterol (PROVENTIL) (2.5 MG/3ML) 0.083% nebulizer solution Take 3 mLs (2.5 mg total) by nebulization every 6 (six) hours as needed for wheezing or shortness of breath.  Marland Kitchen apixaban (ELIQUIS) 5 MG TABS tablet Take 1 tablet (5 mg total) by mouth 2 (two) times daily.  . Fluticasone-Umeclidin-Vilant (TRELEGY ELLIPTA) 100-62.5-25 MCG/INH AEPB Inhale 1 puff into the lungs daily.  . furosemide (LASIX) 40 MG tablet Take 1 tablet (40 mg total) by mouth 2 (two) times daily.  Marland Kitchen ipratropium (ATROVENT) 0.03 % nasal spray ipratropium bromide 21 mcg (0.03 %) nasal spray  . losartan (COZAAR) 25 MG tablet Take 1 tablet (25 mg total) by mouth daily.  . methocarbamol (ROBAXIN) 500 MG tablet methocarbamol 500 mg tablet  . Multiple Vitamin (MULTIVITAMIN) capsule Take 1 capsule by mouth daily.  . nitroGLYCERIN (NITROSTAT) 0.4 MG SL tablet Place 1 tablet (0.4 mg total) under the tongue every 5 (five) minutes as needed for  chest pain. Maximum dose 3 tablets  . oxyCODONE (ROXICODONE) 15 MG immediate release tablet Take 1 tablet (15 mg total) by mouth every 6 (six) hours as needed.  . OXYGEN Inhale 2 L into the lungs 3 (three) times daily as needed (shortness of breath).   . simvastatin (ZOCOR) 20 MG tablet Take 1 tablet (20 mg total) by mouth at bedtime.  Marland Kitchen spironolactone (ALDACTONE) 25 MG tablet Take 1 tablet (25 mg total) by mouth daily.  . VENTOLIN HFA 108 (90 Base) MCG/ACT inhaler INHALE 2 PUFFS INTO LUNGS EVERY 6 HOURS AS NEEDED FOR WHEEZING OR SHORTNESS OF BREATH  . vitamin B-12 (CYANOCOBALAMIN) 1000 MCG tablet Take 1,000 mcg by mouth daily.   No facility-administered encounter medications on file as of 08/31/2020.     Objective:   Goals Addressed              This Visit's Progress     Patient Stated   .  "My breathing is bad" (pt-stated)        CARE PLAN ENTRY (see longtitudinal plan of care for additional care plan information)  Current Barriers:  . Social, community, and financial barriers:  o Patient in [place]. Working on solutions for affordability. Heard back from cardiology team that switch from Eliquis to Xarelto through [place] select program would be an appropriate substitution.  . Last eGFR ~52 mL/min . Polypharmacy; complex patient with multiple comorbidities including COPD (hx tobacco abuse); atrial fibrillation, CHF (reduced but recovered, most recent 55%), CAD (hx MI, CVA x2), CKD (single kidney) o CHF: Losartan 25 mg QPM,  furosemide 20 mg QAM, spironolactone 25 mg QAM;  o Afib: Eliquis 5 mg BID; plan to transition  o ASCVD risk reduction: simvastatin 20 mg daily  o COPD: Trelegy 100/62.5/25 mcg daily, albuterol HFA or Duonebs TID-QID; oxygen 24 hours.  o Anemia w/ CKD: recommended to f/u with Dr. Candiss Norse. o Chronic pain: oxycodone 15 mg Q6H prn. PRN methocarbamol use, though using infrequently.  o Urinary symptoms: Dr. Bernardo Heater, trial of silodosin  Pharmacist  Clinical Goal(s):  Marland Kitchen Over the next 90 days, patient will work with PharmD and provider towards optimized medication management  Interventions: . Comprehensive medication review performed, medication list updated in electronic medical record . Inter-disciplinary care team collaboration (see longitudinal plan of care) . Provided phone number for [facility] select program for Xarelto 20 mg daily to patient. He verbalized understanding. He will call me with any questions or concerns. Cardiology team aware to be on the lookout for faxed prescription portion from [place]  Patient Self Care Activities:  . Patient will take medications as prescribed  Please see past updates related to this goal by clicking on the "Past Updates" button in the selected goal          Plan:  - Will f/u as previously scheduled  Catie Darnelle Maffucci, PharmD, [place], [place] Pharmacist [place] [phone]

## 2020-09-01 ENCOUNTER — Ambulatory Visit: Payer: Medicare HMO | Admitting: Urology

## 2020-09-06 DIAGNOSIS — J449 Chronic obstructive pulmonary disease, unspecified: Secondary | ICD-10-CM | POA: Diagnosis not present

## 2020-09-07 ENCOUNTER — Telehealth: Payer: Self-pay | Admitting: Pharmacist

## 2020-09-07 NOTE — Telephone Encounter (Signed)
I received a fax from [facility] specialty pharmacy on 09/03/20 for pt Xarelto. I filled out the Rx and faxed to the [facility] office for Dr. Windell Hummingbird signature on 09/03/20. Please confirm this was received, signed, and faxed back to [facility]

## 2020-09-08 ENCOUNTER — Ambulatory Visit: Payer: Medicare HMO | Admitting: Pharmacist

## 2020-09-08 DIAGNOSIS — I2510 Atherosclerotic heart disease of native coronary artery without angina pectoris: Secondary | ICD-10-CM

## 2020-09-08 DIAGNOSIS — I2583 Coronary atherosclerosis due to lipid rich plaque: Secondary | ICD-10-CM

## 2020-09-08 DIAGNOSIS — I4819 Other persistent atrial fibrillation: Secondary | ICD-10-CM

## 2020-09-08 NOTE — Patient Instructions (Signed)
Visit Information  Goals Addressed              This Visit's Progress     Patient Stated   .  "My breathing is bad" (pt-stated)        CARE PLAN ENTRY (see longtitudinal plan of care for additional care plan information)  Current Barriers:  . Social, community, and financial barriers:  o Calls today reporting that he received Xarelto from [employer]. He notes he will complete Eliquis supply on Thursday and start Xarelto on Friday. . Last eGFR ~52 mL/min . Polypharmacy; complex patient with multiple comorbidities including COPD (hx tobacco abuse); atrial fibrillation, CHF (reduced but recovered, most recent 55%), CAD (hx MI, CVA x2), CKD (single kidney) o CHF: Losartan 25 mg QPM, furosemide 20 mg QAM, spironolactone 25 mg QAM;  o Afib: Eliquis 5 mg BID; plan to transition to Xarelto 20 mg daily o ASCVD risk reduction: simvastatin 20 mg daily  o COPD: Trelegy 100/62.5/25 mcg daily, albuterol HFA or Duonebs TID-QID; oxygen 24 hours.  o Anemia w/ CKD: recommended to f/u with Dr. Candiss Norse. o Chronic pain: oxycodone 15 mg Q6H prn. PRN methocarbamol use, though using infrequently.  o Urinary symptoms: Dr. Bernardo Heater, trial of silodosin  Pharmacist Clinical Goal(s):  Marland Kitchen Over the next 90 days, patient will work with PharmD and provider towards optimized medication management  Interventions: . Counseled patient to take Xarelto with the largest meal of his day. He verbalizes understanding.   Patient Self Care Activities:  . Patient will take medications as prescribed  Please see past updates related to this goal by clicking on the "Past Updates" button in the selected goal         The patient verbalized understanding of instructions provided today and declined a print copy of patient instruction materials.   Plan:  - Will outreach as previously scheduled  Catie Darnelle Maffucci, PharmD, [place], [place] Pharmacist [place] [phone]

## 2020-09-08 NOTE — Chronic Care Management (AMB) (Signed)
Chronic Care Management   Follow Up Note   09/08/2020 Name: Victor Daniels MRN: [id number] DOB: August 02, 1946  Referred by: Victor Mc, MD Reason for referral : Chronic Care Management (Medication Management)   Victor Daniels is a 74 y.o. year old male who is a primary care patient of Victor Daniels, Victor Everts, MD. The CCM team was consulted for assistance with chronic disease management and care coordination needs.    Received phone call from patient on Monday to update on medication acces.  Review of patient status, including review of consultants reports, relevant laboratory and other test results, and collaboration with appropriate care team members and the patient's provider was performed as part of comprehensive patient evaluation and provision of chronic care management services.    SDOH (Social Determinants of Health) assessments performed: No See Care Plan activities for detailed interventions related to [facility] Victor Daniels)     Outpatient Encounter Medications as of 09/08/2020  Medication Sig  . rivaroxaban (XARELTO) 20 MG TABS tablet Take 20 mg by mouth daily with supper.  Marland Kitchen albuterol (PROVENTIL) (2.5 MG/3ML) 0.083% nebulizer solution Take 3 mLs (2.5 mg total) by nebulization every 6 (six) hours as needed for wheezing or shortness of breath.  . Fluticasone-Umeclidin-Vilant (TRELEGY ELLIPTA) 100-62.5-25 MCG/INH AEPB Inhale 1 puff into the lungs daily.  . furosemide (LASIX) 40 MG tablet Take 1 tablet (40 mg total) by mouth 2 (two) times daily.  Marland Kitchen ipratropium (ATROVENT) 0.03 % nasal spray ipratropium bromide 21 mcg (0.03 %) nasal spray  . losartan (COZAAR) 25 MG tablet Take 1 tablet (25 mg total) by mouth daily.  . methocarbamol (ROBAXIN) 500 MG tablet methocarbamol 500 mg tablet  . Multiple Vitamin (MULTIVITAMIN) capsule Take 1 capsule by mouth daily.  . nitroGLYCERIN (NITROSTAT) 0.4 MG SL tablet Place 1 tablet (0.4 mg total) under the tongue every 5 (five) minutes as needed for chest  pain. Maximum dose 3 tablets  . oxyCODONE (ROXICODONE) 15 MG immediate release tablet Take 1 tablet (15 mg total) by mouth every 6 (six) hours as needed.  . OXYGEN Inhale 2 L into the lungs 3 (three) times daily as needed (shortness of breath).   . simvastatin (ZOCOR) 20 MG tablet Take 1 tablet (20 mg total) by mouth at bedtime.  Marland Kitchen spironolactone (ALDACTONE) 25 MG tablet Take 1 tablet (25 mg total) by mouth daily.  . VENTOLIN HFA 108 (90 Base) MCG/ACT inhaler INHALE 2 PUFFS INTO LUNGS EVERY 6 HOURS AS NEEDED FOR WHEEZING OR SHORTNESS OF BREATH  . vitamin B-12 (CYANOCOBALAMIN) 1000 MCG tablet Take 1,000 mcg by mouth daily.  . [DISCONTINUED] apixaban (ELIQUIS) 5 MG TABS tablet Take 1 tablet (5 mg total) by mouth 2 (two) times daily.   No facility-administered encounter medications on file as of 09/08/2020.     Objective:   Goals Addressed              This Visit's Progress     Patient Stated   .  "My breathing is bad" (pt-stated)        CARE PLAN ENTRY (see longtitudinal plan of care for additional care plan information)  Current Barriers:  . Social, community, and financial barriers:  o Calls today reporting that he received Xarelto from [employer]. He notes he will complete Eliquis supply on Thursday and start Xarelto on Friday. . Last eGFR ~52 mL/min . Polypharmacy; complex patient with multiple comorbidities including COPD (hx tobacco abuse); atrial fibrillation, CHF (reduced but recovered, most recent 55%), CAD (  hx MI, CVA x2), CKD (single kidney) o CHF: Losartan 25 mg QPM, furosemide 20 mg QAM, spironolactone 25 mg QAM;  o Afib: Eliquis 5 mg BID; plan to transition to Xarelto 20 mg daily o ASCVD risk reduction: simvastatin 20 mg daily  o COPD: Trelegy 100/62.5/25 mcg daily, albuterol HFA or Duonebs TID-QID; oxygen 24 hours.  o Anemia w/ CKD: recommended to f/u with Victor Daniels. o Chronic pain: oxycodone 15 mg Q6H prn. PRN methocarbamol use, though using  infrequently.  o Urinary symptoms: Victor Daniels, trial of silodosin  Pharmacist Clinical Goal(s):  Marland Kitchen Over the next 90 days, patient will work with PharmD and provider towards optimized medication management  Interventions: . Counseled patient to take Xarelto with the largest meal of his day. He verbalizes understanding.   Patient Self Care Activities:  . Patient will take medications as prescribed  Please see past updates related to this goal by clicking on the "Past Updates" button in the selected goal          Plan:  - Will outreach as previously scheduled  Victor Daniels, PharmD, [place], [place] Pharmacist Victor Daniels [place] [phone]

## 2020-09-08 NOTE — Telephone Encounter (Signed)
Patient called me today, confirmed that he received Xarelto order via [employer] yesterday. Finishes Eliquis supply tomorrow, and will start Xarelto on Friday. Counseled to take in the evening after largest meal. Patient verbalized understanding.

## 2020-09-09 ENCOUNTER — Other Ambulatory Visit: Payer: Self-pay | Admitting: *Deleted

## 2020-09-09 NOTE — Patient Outreach (Signed)
[facility]) Care Management  [facility] Care Manager  09/09/2020   Victor Daniels 04-18-1946 [id number]   Telephone assessment   Subjective:  Unsuccessful outreach call to patient , no answer able to leave a HIPAA compliant message for return call.    Plan Will plan return call in the next 4 business days.    Victor Garibaldi, RN, BSN  [facility] Care Management,Care Management Coordinator  [phone]- Mobile [phone]- Toll Free Main Office

## 2020-09-14 ENCOUNTER — Other Ambulatory Visit: Payer: Self-pay | Admitting: *Deleted

## 2020-09-14 NOTE — Patient Outreach (Signed)
[facility]) Care Management  [facility] Care Manager  09/14/2020   THELMA VIANA [birth year] [id number]   Telephone Assessment   Date of Admission :07/29/20 Diagnosis :Acute on Chronic Diastolic heart failure Date of Discharge:08/03/20 Facility :[facility] Insurance:[employer] Transition of care by PCP   Recent Hospital Admission : [facility] 6/28-7/2 Acute on Chronic Diastolic heart failure  [facility] Admission 7/30-8/5 Acute on Chronic Diastolic heart failure [facility] ED visit 07/03/20 Dx: Chest Pain, bronchitis [facility] : 8/5-8/10  PMHx:73 y.o.malewith medical history significant ofhypertension, hyperlipidemia COPD, on 2 L oxygen, stroke, PAD, OSA, CAD, DVT atrial fibrillation on Eliquis,dCHF, alcoholic gastritis,BPH.    Subjective:  Unsuccessful outreach call to patient, no answer , able to leave a HIPAA compliant message for return call.   Plan Will plan return call in the next 4 business days.    [name], RN, BSN  [facility] Care Management,Care Management Coordinator  [phone]- Mobile [phone]- Toll Free Main Office

## 2020-09-15 ENCOUNTER — Other Ambulatory Visit: Payer: Self-pay | Admitting: *Deleted

## 2020-09-15 NOTE — Patient Outreach (Addendum)
[facility]) Care Management  [facility] Care Manager  09/15/2020   Victor Daniels 05/31/1946 [id number]   Telephone assessment   Date of Admission :07/29/20 Diagnosis :Acute on Chronic Diastolic heart failure Date of Discharge:08/03/20 Facility :[facility] Insurance:[employer] Transition of care by PCP   Recent Hospital Admission : [facility] 6/28-7/2 Acute on Chronic Diastolic heart failure  [facility] Admission 7/30-8/5 Acute on Chronic Diastolic heart failure [facility] ED visit 07/03/20 Dx: Chest Pain, bronchitis [facility] : 8/5-8/10  PMHx:73 y.o.malewith medical history significant ofhypertension, hyperlipidemia COPD, on 2 L oxygen, stroke, PAD, OSA, CAD, DVT atrial fibrillation on Eliquis,dCHF, alcoholic gastritis,BPH.  Subjective:  Patient reports returning call from previous outreach call on yesterday.  He discussed that he is doing good. He discussed starting a new blood thinner on last week and everything going okay, denies signs of bleeding reports having scratches from dog, no excessive bleeding, site with scab forming.  Patient reports that he is continuing to monitor weights and current weight is at 260,  No sudden weight increases, denies increase in swelling or worsening of shortness of breath.      Encounter Medications:  Outpatient Encounter Medications as of 09/15/2020  Medication Sig  . albuterol (PROVENTIL) (2.5 MG/3ML) 0.083% nebulizer solution Take 3 mLs (2.5 mg total) by nebulization every 6 (six) hours as needed for wheezing or shortness of breath.  . Fluticasone-Umeclidin-Vilant (TRELEGY ELLIPTA) 100-62.5-25 MCG/INH AEPB Inhale 1 puff into the lungs daily.  . furosemide (LASIX) 40 MG tablet Take 1 tablet (40 mg total) by mouth 2 (two) times daily.  Marland Kitchen ipratropium (ATROVENT) 0.03 % nasal spray ipratropium bromide 21 mcg (0.03 %) nasal spray  . losartan (COZAAR) 25 MG tablet Take 1 tablet (25 mg total) by mouth daily.  .  methocarbamol (ROBAXIN) 500 MG tablet methocarbamol 500 mg tablet  . Multiple Vitamin (MULTIVITAMIN) capsule Take 1 capsule by mouth daily.  . nitroGLYCERIN (NITROSTAT) 0.4 MG SL tablet Place 1 tablet (0.4 mg total) under the tongue every 5 (five) minutes as needed for chest pain. Maximum dose 3 tablets  . oxyCODONE (ROXICODONE) 15 MG immediate release tablet Take 1 tablet (15 mg total) by mouth every 6 (six) hours as needed.  . OXYGEN Inhale 2 L into the lungs 3 (three) times daily as needed (shortness of breath).   . rivaroxaban (XARELTO) 20 MG TABS tablet Take 20 mg by mouth daily with supper.  . simvastatin (ZOCOR) 20 MG tablet Take 1 tablet (20 mg total) by mouth at bedtime.  Marland Kitchen spironolactone (ALDACTONE) 25 MG tablet Take 1 tablet (25 mg total) by mouth daily.  . VENTOLIN HFA 108 (90 Base) MCG/ACT inhaler INHALE 2 PUFFS INTO LUNGS EVERY 6 HOURS AS NEEDED FOR WHEEZING OR SHORTNESS OF BREATH  . vitamin B-12 (CYANOCOBALAMIN) 1000 MCG tablet Take 1,000 mcg by mouth daily.   No facility-administered encounter medications on file as of 09/15/2020.    Functional Status:  In your present state of health, do you have any difficulty performing the following activities: 08/09/2020 07/28/2020  Hearing? N N  Vision? N N  Difficulty concentrating or making decisions? N N  Walking or climbing stairs? Tempie Donning  Comment uses walker uses walker  Dressing or bathing? Tempie Donning  Comment wife assist at times wife helps  Doing errands, shopping? Tempie Donning  Comment wife assist, patient does not drive wife assists  Conservation officer, nature and eating ? N -  Using the Toilet? N -  In the past six months, have  you accidently leaked urine? Y -  Comment wears depends -  Do you have problems with loss of bowel control? N -  Managing your Medications? Y -  Comment wife assist at time , [facility] embedded pharmacist -  Managing your Finances? N -  Housekeeping or managing your Housekeeping? Y -  Comment wife does household management -  Some  recent data might be hidden    Fall/Depression Screening: Fall Risk  08/09/2020 08/03/2020 07/06/2020  Falls in the past year? '1 1 1  ' Number falls in past yr: 0 0 1  Injury with Fall? 0 0 0  Risk for fall due to : Impaired balance/gait - History of fall(s);Impaired balance/gait  Follow up Falls prevention discussed Falls evaluation completed Falls prevention discussed   PHQ 2/9 Scores 08/09/2020 08/03/2020 06/29/2020 03/08/2020 05/01/2019 03/06/2019 07/23/2018  PHQ - 2 Score 0 0 1 0 0 0 0  PHQ- 9 Score - - - - 0 - -    Assessment:   Heart Failure No worsening symptoms, wife support with limiting salt in diet .Tolerating walking plan in home.  Will benefit from ongoing support with Heart failure self health management.    COPD Continues with wearing oxygen as prescribed, reports having all medications and taking as prescribed. Patient continues  considering Covid 19 vaccine has his wife has had initial vaccine,declined additional education handout at this time.    Plan:  Will plan follow up call in the next month assess for other care management needs and consider transition to disease management .    [facility] CM Care Plan Problem One     Most Recent Value  Care Plan Problem One At risk for readmission related to recent disccharge due to diagnosis of heart faiilure, less than 30 day readmission    Role Documenting the Problem One Care Management Telephonic Coordinator  Care Plan for Problem One Active  [facility] Long Term Goal  Patient will not report hospital readmission over the next 60 days   [goal update. ]  [facility] Long Term Goal Start Date 08/09/20  Interventions for Problem One Long Term Goal Discussed current clinical state, reinforced continuing monitoring weights, teachback on when to call MD of yellow zone symptoms, Verified recieiving [facility] education on low salt foods encouraged review. Discussed upcoming appointment with Dr. Rockey Situ.   [facility] CM Short Term Goal #1  Over the next 30 days patient will  be able to report monitoring and keeping a log on daily weights   [facility] CM Short Term Goal #1 Start Date 08/09/20  [facility] CM Short Term Goal #1 Met Date 09/15/20  [facility] CM Short Term Goal #2  Over the next 30 days patient will attend all medical appointments   [facility] CM Short Term Goal #2 Start Date 08/09/20  [facility] CM Short Term Goal #2 Met Date 09/15/20  [facility] CM Short Term Goal #3 Patient will be able to verbalize 3 signs & symptoms in yellow zone Heart failure to report to MD over the next 30 days   [facility] CM Short Term Goal #3 Start Date 08/09/20  Barrie Folk restart ]  [facility] CM Short Term Goal #3 Met Date 09/15/20  [facility] CM Short Term Goal #4 Over the next 30 days patient will report walking at least 10 minutes 4 days a week.   [facility] CM Short Term Goal #4 Start Date 08/09/20  [facility] CM Short Term Goal #4 Met Date 09/15/20      Joylene Draft, RN, BSN  [place]  Management,Care Management Coordinator  [phone]- Mobile [phone]- [place] Office

## 2020-09-17 ENCOUNTER — Telehealth: Payer: Self-pay | Admitting: Pharmacist

## 2020-09-17 ENCOUNTER — Telehealth: Payer: Self-pay | Admitting: Internal Medicine

## 2020-09-17 ENCOUNTER — Ambulatory Visit: Payer: Self-pay | Admitting: *Deleted

## 2020-09-17 MED ORDER — OXYCODONE HCL 15 MG PO TABS: 15.0000 mg | ORAL_TABLET | Freq: Four times a day (QID) | ORAL | 0 refills | Status: DC | PRN

## 2020-09-17 NOTE — Telephone Encounter (Signed)
Pt needs a refill ASAP on oxyCODONE (ROXICODONE) 15 MG immediate release tablet sent to [facility] on [address]

## 2020-09-17 NOTE — Telephone Encounter (Signed)
  Chronic Care Management   Note  09/17/2020 Name: Victor Daniels MRN: [id number] DOB: 02/01/1946   Received voicemail from patient requesting a refill on oxycodone.   Called patient back, reminded that he needs to go through proper channels for refills. He needs to call the pharmacy first for refill requests. He notes that he did that, and they told him to call the office. I let him know that he needs to call the office line directly, not my direct line, for medication refill requests. He noted he would do so today.   Catie Feliz Beam, PharmD, [place], CPP Clinical Pharmacist [facility] [place] [employer] [phone]

## 2020-09-22 ENCOUNTER — Ambulatory Visit: Payer: Medicare HMO | Admitting: Pharmacist

## 2020-09-22 DIAGNOSIS — I10 Essential (primary) hypertension: Secondary | ICD-10-CM

## 2020-09-22 DIAGNOSIS — I2510 Atherosclerotic heart disease of native coronary artery without angina pectoris: Secondary | ICD-10-CM | POA: Diagnosis not present

## 2020-09-22 DIAGNOSIS — E785 Hyperlipidemia, unspecified: Secondary | ICD-10-CM | POA: Diagnosis not present

## 2020-09-22 DIAGNOSIS — I2583 Coronary atherosclerosis due to lipid rich plaque: Secondary | ICD-10-CM

## 2020-09-22 DIAGNOSIS — I5030 Unspecified diastolic (congestive) heart failure: Secondary | ICD-10-CM | POA: Diagnosis not present

## 2020-09-22 DIAGNOSIS — J431 Panlobular emphysema: Secondary | ICD-10-CM

## 2020-09-22 DIAGNOSIS — I4819 Other persistent atrial fibrillation: Secondary | ICD-10-CM

## 2020-09-22 NOTE — Patient Instructions (Signed)
Victor Daniels,   Keep up the GREAT WORK!  I recommend you check your blood pressure about 3-4 times per week. Alternate times of the day - sometimes check in the morning, sometimes in the evening. Keep track of these readings and provide at our future calls or any of your future appointments.   As always, call me with any questions or concerns!  Catie Darnelle Maffucci, PharmD [phone]  Visit Information  Goals Addressed              This Visit's Progress     Patient Stated   .  "My breathing is bad" (pt-stated)        CARE PLAN ENTRY (see longtitudinal plan of care for additional care plan information)  Current Barriers:  . Social, community, and financial barriers:  o Notes that he received Xarelto from [employer]. Received 90 day supply. . Last eGFR ~52 mL/min . Polypharmacy; complex patient with multiple comorbidities including COPD (hx tobacco abuse); atrial fibrillation, CHF (reduced but recovered, most recent 55%), CAD (hx MI, CVA x2), CKD (single kidney) o CHF: Losartan 25 mg QPM, furosemide 40 mg QAM, spironolactone 25 mg QAM;  - Notes that he is NOT checking BP at home - Weighing daily: weight this morning 161 lbs. Aware of weight gain warning signs o Afib: Xarelto 20 mg daily o ASCVD risk reduction: simvastatin 20 mg daily  o COPD: Trelegy 100/62.5/25 mcg daily, albuterol HFA or Duonebs TID-QID; oxygen 24 hours. Notes that yesterday was a good breathing day, he did not require rescue albuterol or Duonebx o Anemia w/ CKD: recommended to f/u with Dr. Candiss Norse. o Chronic pain: oxycodone 15 mg Q6H prn. PRN methocarbamol 500 mg o Urinary symptoms: Dr. Bernardo Heater, upcoming procedure for BPH symptoms. Notes he is using an OTC supplement, but isn't sure what it is and isn't home to be able to check for me  Pharmacist Clinical Goal(s):  Marland Kitchen Over the next 90 days, patient will work with PharmD and provider towards optimized medication  management  Interventions: . Comprehensive medication review performed, medication list updated in electronic medical record . Inter-disciplinary care team collaboration (see longitudinal plan of care) . Reviewed goal BP. Discussed importance of ambulatory blood pressure monitoring rather than relying on clinic BP readings alone. Patient agrees to start checking home BP three times weekly.  . Encouraged continued use of weekly pill box to aid in adherence.  . Encouraged to double check his "prostate health" supplement and call me to review if drug interactions. Encouraged to discuss expected efficacy w/ Dr. Bernardo Heater. . Praised for daily weights. Encouraged to continue weighing daily, even if he "feels great"  Patient Self Care Activities:  . Patient will take medications as prescribed . Patient will weigh daily, record, and report weight gain >3 lbs/day or 5 lbs/week . Patient will check BP three times weekly, record, and provide for future visits.  Please see past updates related to this goal by clicking on the "Past Updates" button in the selected goal         The patient verbalized understanding of instructions provided today and agreed to receive a mailed copy of patient instruction and/or educational materials.  Plan:  - Scheduled f/u call in ~ 8 weeks  Catie Darnelle Maffucci, PharmD, [place], [place] Pharmacist [place] [phone]

## 2020-09-22 NOTE — Chronic Care Management (AMB) (Signed)
Chronic Care Management   Follow Up Note   09/22/2020 Name: ATSUSHI YOM MRN: [id number] DOB: [birth year]  Referred by: Crecencio Mc, MD Reason for referral : Chronic Care Management (Medication Management)   FERRY MATTHIS is a 74 y.o. year old male who is a primary care patient of Tullo, Aris Everts, MD. The CCM team was consulted for assistance with chronic disease management and care coordination needs.    Contacted patient for medication management review.  Review of patient status, including review of consultants reports, relevant laboratory and other test results, and collaboration with appropriate care team members and the patient's provider was performed as part of comprehensive patient evaluation and provision of chronic care management services.    SDOH (Social Determinants of Health) assessments performed: Yes See Care Plan activities for detailed interventions related to SDOH)  SDOH Interventions     Most Recent Value  SDOH Interventions  Financial Strain Interventions Other (Comment)  [assistance programs]       Outpatient Encounter Medications as of 09/22/2020  Medication Sig Note  . albuterol (PROVENTIL) (2.5 MG/3ML) 0.083% nebulizer solution Take 3 mLs (2.5 mg total) by nebulization every 6 (six) hours as needed for wheezing or shortness of breath.   . Fluticasone-Umeclidin-Vilant (TRELEGY ELLIPTA) 100-62.5-25 MCG/INH AEPB Inhale 1 puff into the lungs daily.   . furosemide (LASIX) 40 MG tablet Take 1 tablet (40 mg total) by mouth 2 (two) times daily. 09/22/2020: 1 tab QAM  . ipratropium (ATROVENT) 0.03 % nasal spray ipratropium bromide 21 mcg (0.03 %) nasal spray   . losartan (COZAAR) 25 MG tablet Take 1 tablet (25 mg total) by mouth daily.   . methocarbamol (ROBAXIN) 500 MG tablet Take 500 mg by mouth every 6 (six) hours as needed.    . Multiple Vitamin (MULTIVITAMIN) capsule Take 1 capsule by mouth daily.   Marland Kitchen oxyCODONE (ROXICODONE) 15 MG immediate  release tablet Take 1 tablet (15 mg total) by mouth every 6 (six) hours as needed.   . OXYGEN Inhale 2 L into the lungs 3 (three) times daily as needed (shortness of breath).    . rivaroxaban (XARELTO) 20 MG TABS tablet Take 20 mg by mouth daily with supper.   . simvastatin (ZOCOR) 20 MG tablet Take 1 tablet (20 mg total) by mouth at bedtime.   Marland Kitchen spironolactone (ALDACTONE) 25 MG tablet Take 1 tablet (25 mg total) by mouth daily.   . vitamin B-12 (CYANOCOBALAMIN) 1000 MCG tablet Take 1,000 mcg by mouth daily.   . nitroGLYCERIN (NITROSTAT) 0.4 MG SL tablet Place 1 tablet (0.4 mg total) under the tongue every 5 (five) minutes as needed for chest pain. Maximum dose 3 tablets (Patient not taking: Reported on 09/22/2020)   . oxyCODONE (ROXICODONE) 15 MG immediate release tablet Take 1 tablet (15 mg total) by mouth every 6 (six) hours as needed for pain.   . VENTOLIN HFA 108 (90 Base) MCG/ACT inhaler INHALE 2 PUFFS INTO LUNGS EVERY 6 HOURS AS NEEDED FOR WHEEZING OR SHORTNESS OF BREATH (Patient not taking: Reported on 09/22/2020)    No facility-administered encounter medications on file as of 09/22/2020.     Objective:   Goals Addressed              This Visit's Progress     Patient Stated   .  "My breathing is bad" (pt-stated)        CARE PLAN ENTRY (see longtitudinal plan of care for additional care plan information)  Current Barriers:  .  Social, community, and financial barriers:  o Notes that he received Xarelto from [employer]. Received 90 day supply. . Last eGFR ~52 mL/min . Polypharmacy; complex patient with multiple comorbidities including COPD (hx tobacco abuse); atrial fibrillation, CHF (reduced but recovered, most recent 55%), CAD (hx MI, CVA x2), CKD (single kidney) o CHF: Losartan 25 mg QPM, furosemide 40 mg QAM, spironolactone 25 mg QAM;  - Notes that he is NOT checking BP at home - Weighing daily: weight this morning 161 lbs. Aware of weight gain warning  signs o Afib: Xarelto 20 mg daily o ASCVD risk reduction: simvastatin 20 mg daily  o COPD: Trelegy 100/62.5/25 mcg daily, albuterol HFA or Duonebs TID-QID; oxygen 24 hours. Notes that yesterday was a good breathing day, he did not require rescue albuterol or Duonebx o Anemia w/ CKD: recommended to f/u with Dr. Candiss Norse. o Chronic pain: oxycodone 15 mg Q6H prn. PRN methocarbamol 500 mg o Urinary symptoms: Dr. Bernardo Heater, upcoming procedure for BPH symptoms. Notes he is using an OTC supplement, but isn't sure what it is and isn't home to be able to check for me  Pharmacist Clinical Goal(s):  Marland Kitchen Over the next 90 days, patient will work with PharmD and provider towards optimized medication management  Interventions: . Comprehensive medication review performed, medication list updated in electronic medical record . Inter-disciplinary care team collaboration (see longitudinal plan of care) . Reviewed goal BP. Discussed importance of ambulatory blood pressure monitoring rather than relying on clinic BP readings alone. Patient agrees to start checking home BP three times weekly.  . Encouraged continued use of weekly pill box to aid in adherence.  . Encouraged to double check his "prostate health" supplement and call me to review if drug interactions. Encouraged to discuss expected efficacy w/ Dr. Bernardo Heater. . Praised for daily weights. Encouraged to continue weighing daily, even if he "feels great"  Patient Self Care Activities:  . Patient will take medications as prescribed . Patient will weigh daily, record, and report weight gain >3 lbs/day or 5 lbs/week . Patient will check BP three times weekly, record, and provide for future visits.  Please see past updates related to this goal by clicking on the "Past Updates" button in the selected goal          Plan:  - Scheduled f/u call in ~ 8 weeks  Catie Darnelle Maffucci, PharmD, [place], [place] Pharmacist [place] [phone]
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
## 2020-09-28 ENCOUNTER — Ambulatory Visit: Payer: Medicare HMO | Admitting: Cardiovascular Disease

## 2020-09-30 ENCOUNTER — Ambulatory Visit: Payer: Medicare HMO | Admitting: Urology

## 2020-10-04 ENCOUNTER — Telehealth (INDEPENDENT_AMBULATORY_CARE_PROVIDER_SITE_OTHER): Payer: Medicare HMO | Admitting: Urology

## 2020-10-04 ENCOUNTER — Other Ambulatory Visit: Payer: Self-pay

## 2020-10-04 ENCOUNTER — Ambulatory Visit: Payer: Medicare HMO | Admitting: Urology

## 2020-10-04 ENCOUNTER — Encounter: Payer: Self-pay | Admitting: Urology

## 2020-10-04 DIAGNOSIS — N401 Enlarged prostate with lower urinary tract symptoms: Secondary | ICD-10-CM | POA: Diagnosis not present

## 2020-10-04 NOTE — Progress Notes (Signed)
Virtual Visit via Telephone note  I connected with [name] on 10/04/2020 at  2:45 PM EDT by a video enabled telemedicine application and verified that I am speaking with the correct person using two identifiers.  Location: Patient: Home Provider: [facility]   I discussed the limitations of evaluation and management by telemedicine and the availability of in person appointments. The patient expressed understanding and agreed to proceed.  History of Present Illness: Victor Daniels is a 74 y.o. male who returns for a 2 month follow up and discussion regarding Urolift.   - 6-8 month history of bothersome urinary symptoms -See my office note 06/09/2020 -No improvement on tamsulosin -Cystoscopy 07/09/2020 with some narrowing of the distal urethra able to be gently negotiated with the scope; moderate lateral lobe enlargement -Most bothersome symptoms were frequency intermittent stream urgency weak urinary stream and sensation incomplete emptying -IPSS was 24/35  -He presents today for follow-up and voiding pattern is stable.  He recently started a prostate supplement but has only been taken a few weeks.  He has noted variable nocturia that he attributes to his diuretic.  He does have sleep apnea and is not on CPAP.    Observations/Objective: Patient is engaged and asking appropriate questions.   Assessment and Plan:  1. BPH with LUTS  -He may be interested in UroLift in the future however wants to take the supplements longer see if he notes any improvement. -I did discuss surgical management may not improve his nocturia especially with a history of untreated sleep apnea -We discussed UroLift in detail including the procedure, side effects and recovery -He has not had a TRUS and if interested in pursuing UroLift in the future will get this scheduled.   Follow Up Instructions: He will call back as needed   I discussed the assessment and treatment plan with  the patient. The patient was provided an opportunity to ask questions and all were answered. The patient agreed with the plan and demonstrated an understanding of the instructions.   The patient was advised to call back or seek an in-person evaluation if the symptoms worsen or if the condition fails to improve as anticipated.  I provided 12 minutes of non-face-to-face time during this encounter.   Georges Mouse, am acting as a scribe for Dr. Lorin Picket C. Tavin Vernet,  I have reviewed the above documentation for accuracy and completeness, and I agree with the above.    Riki Altes, MD

## 2020-10-06 DIAGNOSIS — J449 Chronic obstructive pulmonary disease, unspecified: Secondary | ICD-10-CM | POA: Diagnosis not present

## 2020-10-08 ENCOUNTER — Ambulatory Visit: Payer: Medicare HMO | Admitting: Urology

## 2020-10-08 ENCOUNTER — Other Ambulatory Visit: Payer: Self-pay | Admitting: Internal Medicine

## 2020-10-08 DIAGNOSIS — J431 Panlobular emphysema: Secondary | ICD-10-CM

## 2020-10-08 NOTE — Telephone Encounter (Signed)
Ok to Refill. The original prescription was discontinued on 06/25/2020 by Elease Etienne, MD for the following reason: Stop Taking at Discharge. Renewing this prescription may not be appropriate.

## 2020-10-11 ENCOUNTER — Encounter: Payer: Self-pay | Admitting: Podiatry

## 2020-10-11 ENCOUNTER — Other Ambulatory Visit: Payer: Self-pay

## 2020-10-11 ENCOUNTER — Ambulatory Visit (INDEPENDENT_AMBULATORY_CARE_PROVIDER_SITE_OTHER): Payer: Medicare HMO | Admitting: Podiatry

## 2020-10-11 ENCOUNTER — Ambulatory Visit: Payer: Medicare HMO | Admitting: Podiatry

## 2020-10-11 DIAGNOSIS — N1831 Chronic kidney disease, stage 3a: Secondary | ICD-10-CM

## 2020-10-11 DIAGNOSIS — L97521 Non-pressure chronic ulcer of other part of left foot limited to breakdown of skin: Secondary | ICD-10-CM

## 2020-10-11 DIAGNOSIS — B351 Tinea unguium: Secondary | ICD-10-CM

## 2020-10-11 DIAGNOSIS — E1159 Type 2 diabetes mellitus with other circulatory complications: Secondary | ICD-10-CM | POA: Diagnosis not present

## 2020-10-11 DIAGNOSIS — E0843 Diabetes mellitus due to underlying condition with diabetic autonomic (poly)neuropathy: Secondary | ICD-10-CM

## 2020-10-11 DIAGNOSIS — D6869 Other thrombophilia: Secondary | ICD-10-CM | POA: Diagnosis not present

## 2020-10-11 DIAGNOSIS — M79676 Pain in unspecified toe(s): Secondary | ICD-10-CM

## 2020-10-11 DIAGNOSIS — I872 Venous insufficiency (chronic) (peripheral): Secondary | ICD-10-CM | POA: Diagnosis not present

## 2020-10-11 DIAGNOSIS — L989 Disorder of the skin and subcutaneous tissue, unspecified: Secondary | ICD-10-CM

## 2020-10-11 MED ORDER — CEPHALEXIN 500 MG PO CAPS
500.0000 mg | ORAL_CAPSULE | Freq: Two times a day (BID) | ORAL | 0 refills | Status: DC
Start: 2020-10-11 — End: 2020-10-27

## 2020-10-11 NOTE — Progress Notes (Signed)
This patient returns to my office for at risk foot care.  This patient requires this care by a professional since this patient will be at risk due to having DVT, CKD, Diabetes with angiopathy, venous insufficiency and coagulation defect.  Patient is taking xarelto.  This patient presents for nail care and skin lesion big toe left foot.  Patient also admits stubbing his toe last week leading to injury second and fourth toe  Left foot.This patient is unable to cut nails himself since the patient cannot reach his nails.These nails and callus big toe left foot  are painful walking and wearing shoes.  This patient presents for at risk foot care and evaluation of her injured toes left foot. today.  General Appearance  Alert, conversant and in no acute stress.  Vascular  Dorsalis pedis and posterior tibial  pulses are diminished   Bilaterally due to swelling  B/L.Marland Kitchen  Capillary return is within normal limits  bilaterally. Temperature is within normal limits  Bilaterally. Chronic venous insufficiency  B/L.  Neurologic  Senn-Weinstein monofilament wire test within normal limits  bilaterally. Muscle power within normal limits bilaterally.  Nails Thick disfigured discolored nails with subungual debris  from hallux to fifth toes bilaterally. No evidence of bacterial infection or drainage bilaterally.  Orthopedic  No limitations of motion  feet .  No crepitus or effusions noted.  No bony pathology or digital deformities noted.  Skin  normotropic skin with no porokeratosis noted bilaterally.  No signs of infections or ulcers noted.  Callus at sulcus left hallux with fissure noted proximally.  No drainage noted.   Skin ulcerations second and fourth toes left foot.     Onychomycosis  Pain in right toes  Pain in left toes  Benign skin lesion left hallux.  Skin ulcerations 2,4 toes secondary to trauma.  Consent was obtained for treatment procedures.   Mechanical debridement of nails 1-5  bilaterally performed with a  nail nipper.  Filed with dremel without incident.  Benign skin lesion was debrided with # 15 blade.  Two ulcerations second and fourth toe left were cleansed and bandaged with neosporin/bandaid.  Told him to peroxide and rebandage with band-aid.  Prescribe cephalexin 500 mg.  # 20.  RTC 3 months for nail care.  If foot worsens from his injury he should call the office.   Helane Gunther DPM   Return office visit                     Told patient to return for periodic foot care and evaluation due to potential at risk complications.   Helane Gunther DPM

## 2020-10-14 ENCOUNTER — Other Ambulatory Visit: Payer: Self-pay | Admitting: *Deleted

## 2020-10-14 ENCOUNTER — Other Ambulatory Visit: Payer: Self-pay | Admitting: Internal Medicine

## 2020-10-14 DIAGNOSIS — I5030 Unspecified diastolic (congestive) heart failure: Secondary | ICD-10-CM

## 2020-10-14 NOTE — Patient Outreach (Signed)
[facility]) Care Management  [facility] Care Manager  10/14/2020   Victor Daniels 1946-11-01 [id number]   Telephone assessment   Date of Admission :07/29/20 Diagnosis :Acute on Chronic Diastolic heart failure Date of Discharge:08/03/20 Facility :[facility] Insurance:[employer] Transition of care by PCP   Recent Hospital Admission : [facility] 6/28-7/2 Acute on Chronic Diastolic heart failure  [facility] Admission 7/30-8/5 Acute on Chronic Diastolic heart failure [facility] ED visit 07/03/20 Dx: Chest Pain, bronchitis [facility] : 8/5-8/10  PMHx:73 y.o.malewith medical history significant ofhypertension, hyperlipidemia COPD, on 2 L oxygen, stroke, PAD, OSA, CAD, DVT atrial fibrillation on Eliquis,dCHF, alcoholic gastritis,BPH.    Subjective:  Patient reports doing fair on today, seems to be bothered a little more  by his usual pain in legs on today, taking medication as needed. He discussed his usual shortness of breath with activity due to COPD. He denies worsening of symptoms. Oxygen saturation staying in low 90% with oxygen at 2 liters.  He reports that he is doing well with keeping track of his daily weights recent weight 263 and stays within 260-263 .  He he denies increase in  swelling in lower legs.  Patient discussed stubbing his great toe on left foot  recently and visit at podiatrist , prescribed antibiotic keflex to take but he had it filled but has not taken it because site has improved and he doesn't feel like he needs and not taking it . Reinforced with patient importance of taking medication as prescribed to help with healing or prevention of worsening condition , he denies redness or drainage at site, reinforced daily foot care.    Encounter Medications:  Outpatient Encounter Medications as of 10/14/2020  Medication Sig Note  . albuterol (PROVENTIL) (2.5 MG/3ML) 0.083% nebulizer solution Take 3 mLs (2.5 mg total) by nebulization every 6  (six) hours as needed for wheezing or shortness of breath.   . cephALEXin (KEFLEX) 500 MG capsule Take 1 capsule (500 mg total) by mouth 2 (two) times daily.   . Fluticasone-Umeclidin-Vilant (TRELEGY ELLIPTA) 100-62.5-25 MCG/INH AEPB Inhale 1 puff into the lungs daily.   . furosemide (LASIX) 40 MG tablet Take 1 tablet (40 mg total) by mouth 2 (two) times daily. 09/22/2020: 1 tab QAM  . ipratropium (ATROVENT) 0.03 % nasal spray ipratropium bromide 21 mcg (0.03 %) nasal spray   . ipratropium-albuterol (DUONEB) 0.5-2.5 (3) MG/3ML SOLN USE 1 VIAL IN NEBULIZER EVERY 6 HOURS AS NEEDED FOR WHEEZING   . losartan (COZAAR) 25 MG tablet Take 1 tablet (25 mg total) by mouth daily.   . methocarbamol (ROBAXIN) 500 MG tablet Take 500 mg by mouth every 6 (six) hours as needed.    . Multiple Vitamin (MULTIVITAMIN) capsule Take 1 capsule by mouth daily.   . nitroGLYCERIN (NITROSTAT) 0.4 MG SL tablet Place 1 tablet (0.4 mg total) under the tongue every 5 (five) minutes as needed for chest pain. Maximum dose 3 tablets   . oxyCODONE (ROXICODONE) 15 MG immediate release tablet Take 1 tablet (15 mg total) by mouth every 6 (six) hours as needed.   Marland Kitchen oxyCODONE (ROXICODONE) 15 MG immediate release tablet Take 1 tablet (15 mg total) by mouth every 6 (six) hours as needed for pain.   . OXYGEN Inhale 2 L into the lungs 3 (three) times daily as needed (shortness of breath).    . rivaroxaban (XARELTO) 20 MG TABS tablet Take 20 mg by mouth daily with supper.   . simvastatin (ZOCOR) 20 MG tablet Take 1  tablet (20 mg total) by mouth at bedtime.   Marland Kitchen spironolactone (ALDACTONE) 25 MG tablet Take 1 tablet by mouth once daily   . VENTOLIN HFA 108 (90 Base) MCG/ACT inhaler INHALE 2 PUFFS INTO LUNGS EVERY 6 HOURS AS NEEDED FOR WHEEZING OR SHORTNESS OF BREATH   . vitamin B-12 (CYANOCOBALAMIN) 1000 MCG tablet Take 1,000 mcg by mouth daily.    No facility-administered encounter medications on file as of 10/14/2020.    Functional Status:   In your present state of health, do you have any difficulty performing the following activities: 08/09/2020 07/28/2020  Hearing? N N  Vision? N N  Difficulty concentrating or making decisions? N N  Walking or climbing stairs? [name]  Comment uses walker uses walker  Dressing or bathing? [name]  Comment wife assist at times wife helps  Doing errands, shopping? [name]  Comment wife assist, patient does not drive wife assists  Quarry manager and eating ? N -  Using the Toilet? N -  In the past six months, have you accidently leaked urine? Y -  Comment wears depends -  Do you have problems with loss of bowel control? N -  Managing your Medications? Y -  Comment wife assist at time , [facility] embedded pharmacist -  Managing your Finances? N -  Housekeeping or managing your Housekeeping? Y -  Comment wife does household management -  Some recent data might be hidden    Fall/Depression Screening: Fall Risk  08/09/2020 08/03/2020 07/06/2020  Falls in the past year? 1 1 1   Number falls in past yr: 0 0 1  Injury with Fall? 0 0 0  Risk for fall due to : Impaired balance/gait - History of fall(s);Impaired balance/gait  Follow up Falls prevention discussed Falls evaluation completed Falls prevention discussed   PHQ 2/9 Scores 08/09/2020 08/03/2020 06/29/2020 03/08/2020 05/01/2019 03/06/2019 07/23/2018  PHQ - 2 Score 0 0 1 0 0 0 0  PHQ- 9 Score - - - - 0 - -    Assessment:  Goals Addressed            This Visit's Progress   . Manage My Medicine       Follow Up Date 12/23/20   - call for medicine refill 2 or 3 days before it runs out - call if I am sick and can't take my medicine - keep a list of all the medicines I take; vitamins and herbals too    Why is this important?   These steps will help you keep on track with your medicines.    Notes:     . Track and Manage Fluids and Swelling       Follow Up Date 12/23/20   - call office if I gain more than 2 pounds in one day or 5 pounds in one week - do  ankle pumps when sitting - keep legs up while sitting - track weight in diary - use salt in moderation - watch for swelling in feet, ankles and legs every day - weigh myself daily    Why is this important?   It is important to check your weight daily and watch how much salt and liquids you have.  It will help you to manage your heart failure.    Notes:     . Track and Manage My Symptoms       Follow Up Date 12/23/20    - follow rescue plan if symptoms flare-up - keep follow-up appointments  Why is this important?   Tracking your symptoms and other information about your health helps your doctor plan your care.  Write down the symptoms, the time of day, what you were doing and what medicine you are taking.  You will soon learn how to manage your symptoms.     Notes:     . Track and Manage Symptoms       Follow Up Date 12/23/20   - bring diary to all appointments - eat more whole grains, fruits and vegetables, lean meats and healthy fats - follow rescue plan if symptoms flare-up - know when to call the doctor - dress right for the weather, hot or cold    Why is this important?   You will be able to handle your symptoms better if you keep track of them.  Making some simple changes to your lifestyle will help.  Eating healthy is one thing you can do to take good care of yourself.    Notes:       Heart Failure Denies worsening symptoms of heart failure as reviewed. He identifies continuing daily weight monitoring salt in diet .   COPD Continued wearing oxygen as prescribed, reports taking respiratory medication as prescribed. Patient has had 1st Covid vaccine and has scheduled 2nd dose for 10/19.  Reports consistency in taking all respiratory medications and reports improvement with current plan   Patient has completed initial goal post recent discharge, he is agreeable to ongoing follow up with [facility] for management of chronic conditions. He is agreeable to follow up in  disease management program.   Plan Will plan follow up call in the next 2 months.    [name], RN, BSN  [facility] Care Management,Care Management Coordinator  [phone]- Mobile [phone]- Toll Free Main Office

## 2020-10-15 ENCOUNTER — Other Ambulatory Visit: Payer: Self-pay | Admitting: Internal Medicine

## 2020-10-27 ENCOUNTER — Ambulatory Visit: Payer: Medicare HMO | Admitting: Pharmacist

## 2020-10-27 DIAGNOSIS — I2583 Coronary atherosclerosis due to lipid rich plaque: Secondary | ICD-10-CM

## 2020-10-27 DIAGNOSIS — I5030 Unspecified diastolic (congestive) heart failure: Secondary | ICD-10-CM

## 2020-10-27 DIAGNOSIS — I10 Essential (primary) hypertension: Secondary | ICD-10-CM

## 2020-10-27 DIAGNOSIS — J431 Panlobular emphysema: Secondary | ICD-10-CM

## 2020-10-27 DIAGNOSIS — I2510 Atherosclerotic heart disease of native coronary artery without angina pectoris: Secondary | ICD-10-CM

## 2020-10-27 NOTE — Patient Instructions (Signed)
Visit Information  Goals Addressed              This Visit's Progress     Patient Stated   .  PharmD "I want to stay healthy" (pt-stated)        CARE PLAN ENTRY (see longtitudinal plan of care for additional care plan information)  Current Barriers:  . Social, community, and financial barriers:  o Calls today because he found an old bottle of meloxicam, isn't sure what it is for and wonders if he is supposed to be taking it . Last eGFR ~52 mL/min . Polypharmacy; complex patient with multiple comorbidities including COPD (hx tobacco abuse); atrial fibrillation, CHF (reduced but recovered, most recent 55%), CAD (hx MI, CVA x2), CKD (single kidney) o CHF: Losartan 25 mg QPM, furosemide 40 mg QAM, spironolactone 25 mg QAM;  o Afib: Xarelto 20 mg daily; receiving from [employer] savings program o ASCVD risk reduction: simvastatin 20 mg daily  o COPD: Trelegy 100/62.5/25 mcg daily, albuterol HFA PRN or Duonebs TID-QID; oxygen 24 hours o Anemia w/ CKD: advised to schedule f/u with nephrology by PCP o Chronic pain: oxycodone 15 mg Q6H prn. PRN methocarbamol 500 mg o Urinary symptoms: Dr. Bernardo Heater; using an OTC prostate supplement that he reports benefit with  Pharmacist Clinical Goal(s):  Marland Kitchen Over the next 90 days, patient will work with PharmD and provider towards optimized medication management  Interventions: . Discussed avoidance of NSAIDs d/t renal disease and increase in risk of bleeds in combination w/ Xarelto. Patient verbalized understanding and will discard medication  Patient Self Care Activities:  . Patient will take medications as prescribed . Patient will weigh daily, record, and report weight gain >3 lbs/day or 5 lbs/week . Patient will check BP three times weekly, record, and provide for future visits.  Please see past updates related to this goal by clicking on the "Past Updates" button in the selected goal         The patient verbalized understanding of  instructions provided today and declined a print copy of patient instruction materials.   Plan:  - Will outreach in ~3 weeks as previously scheduled  Catie Darnelle Maffucci, PharmD, [place], [place] Pharmacist [facility] [phone]

## 2020-10-27 NOTE — Chronic Care Management (AMB) (Signed)
Chronic Care Management   Follow Up Note   10/27/2020 Name: CLEMENTS TORO MRN: [id number] DOB: [birth year]  Referred by: Crecencio Mc, MD Reason for referral : Chronic Care Management (Medication Management )   GIULIANO PREECE is a 74 y.o. year old male who is a primary care patient of Tullo, Aris Everts, MD. The CCM team was consulted for assistance with chronic disease management and care coordination needs.    Contacted patient for medication management review.   Review of patient status, including review of consultants reports, relevant laboratory and other test results, and collaboration with appropriate care team members and the patient's provider was performed as part of comprehensive patient evaluation and provision of chronic care management services.    SDOH (Social Determinants of Health) assessments performed: No See Care Plan activities for detailed interventions related to [facility])     Outpatient Encounter Medications as of 10/27/2020  Medication Sig Note   albuterol (PROVENTIL) (2.5 MG/3ML) 0.083% nebulizer solution Take 3 mLs (2.5 mg total) by nebulization every 6 (six) hours as needed for wheezing or shortness of breath.    Fluticasone-Umeclidin-Vilant (TRELEGY ELLIPTA) 100-62.5-25 MCG/INH AEPB Inhale 1 puff into the lungs daily.    furosemide (LASIX) 40 MG tablet Take 1 tablet (40 mg total) by mouth 2 (two) times daily. 09/22/2020: 1 tab QAM   ipratropium (ATROVENT) 0.03 % nasal spray ipratropium bromide 21 mcg (0.03 %) nasal spray    ipratropium-albuterol (DUONEB) 0.5-2.5 (3) MG/3ML SOLN USE 1 VIAL IN NEBULIZER EVERY 6 HOURS AS NEEDED FOR WHEEZING    losartan (COZAAR) 25 MG tablet Take 1 tablet (25 mg total) by mouth daily.    methocarbamol (ROBAXIN) 500 MG tablet Take 500 mg by mouth every 6 (six) hours as needed.     Multiple Vitamin (MULTIVITAMIN) capsule Take 1 capsule by mouth daily.    nitroGLYCERIN (NITROSTAT) 0.4 MG SL tablet Place 1 tablet  (0.4 mg total) under the tongue every 5 (five) minutes as needed for chest pain. Maximum dose 3 tablets    oxyCODONE (ROXICODONE) 15 MG immediate release tablet Take 1 tablet (15 mg total) by mouth every 6 (six) hours as needed.    oxyCODONE (ROXICODONE) 15 MG immediate release tablet Take 1 tablet (15 mg total) by mouth every 6 (six) hours as needed for pain.    OXYGEN Inhale 2 L into the lungs 3 (three) times daily as needed (shortness of breath).     rivaroxaban (XARELTO) 20 MG TABS tablet Take 20 mg by mouth daily with supper.    simvastatin (ZOCOR) 20 MG tablet Take 1 tablet (20 mg total) by mouth at bedtime.    spironolactone (ALDACTONE) 25 MG tablet Take 1 tablet by mouth once daily    VENTOLIN HFA 108 (90 Base) MCG/ACT inhaler INHALE 2 PUFFS INTO LUNGS EVERY 6 HOURS AS NEEDED FOR WHEEZING FOR SHORTNESS OF BREATH    vitamin B-12 (CYANOCOBALAMIN) 1000 MCG tablet Take 1,000 mcg by mouth daily.    [DISCONTINUED] cephALEXin (KEFLEX) 500 MG capsule Take 1 capsule (500 mg total) by mouth 2 (two) times daily.    No facility-administered encounter medications on file as of 10/27/2020.     Objective:   Goals Addressed              This Visit's Progress     Patient Stated     PharmD "I want to stay healthy" (pt-stated)        CARE PLAN ENTRY (see longtitudinal plan of care  for additional care plan information)  Current Barriers:   Social, community, and financial barriers:  o Calls today because he found an old bottle of meloxicam, isn't sure what it is for and wonders if he is supposed to be taking it  Last eGFR ~52 mL/min  Polypharmacy; complex patient with multiple comorbidities including COPD (hx tobacco abuse); atrial fibrillation, CHF (reduced but recovered, most recent 55%), CAD (hx MI, CVA x2), CKD (single kidney) o CHF: Losartan 25 mg QPM, furosemide 40 mg QAM, spironolactone 25 mg QAM;  o Afib: Xarelto 20 mg daily; receiving from [employer] savings  program o ASCVD risk reduction: simvastatin 20 mg daily  o COPD: Trelegy 100/62.5/25 mcg daily, albuterol HFA PRN or Duonebs TID-QID; oxygen 24 hours o Anemia w/ CKD: advised to schedule f/u with nephrology by PCP o Chronic pain: oxycodone 15 mg Q6H prn. PRN methocarbamol 500 mg o Urinary symptoms: Dr. Bernardo Heater; using an OTC prostate supplement that he reports benefit with  Pharmacist Clinical Goal(s):   Over the next 90 days, patient will work with PharmD and provider towards optimized medication management  Interventions:  Discussed avoidance of NSAIDs d/t renal disease and increase in risk of bleeds in combination w/ Xarelto. Patient verbalized understanding and will discard medication  Patient Self Care Activities:   Patient will take medications as prescribed  Patient will weigh daily, record, and report weight gain >3 lbs/day or 5 lbs/week  Patient will check BP three times weekly, record, and provide for future visits.  Please see past updates related to this goal by clicking on the "Past Updates" button in the selected goal          Plan:  - Will outreach in ~3 weeks as previously scheduled  Catie Darnelle Maffucci, PharmD, [place], [place] Pharmacist [facility] [phone]

## 2020-11-06 DIAGNOSIS — J449 Chronic obstructive pulmonary disease, unspecified: Secondary | ICD-10-CM | POA: Diagnosis not present
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
## 2020-11-12 ENCOUNTER — Ambulatory Visit: Payer: Medicare HMO | Admitting: Cardiovascular Disease

## 2020-11-12 DIAGNOSIS — I5042 Chronic combined systolic (congestive) and diastolic (congestive) heart failure: Secondary | ICD-10-CM

## 2020-11-12 DIAGNOSIS — I25118 Atherosclerotic heart disease of native coronary artery with other forms of angina pectoris: Secondary | ICD-10-CM

## 2020-11-12 DIAGNOSIS — I10 Essential (primary) hypertension: Secondary | ICD-10-CM

## 2020-11-12 DIAGNOSIS — I4819 Other persistent atrial fibrillation: Secondary | ICD-10-CM

## 2020-11-15 ENCOUNTER — Ambulatory Visit: Payer: Medicare HMO | Admitting: Pharmacist

## 2020-11-15 DIAGNOSIS — I10 Essential (primary) hypertension: Secondary | ICD-10-CM

## 2020-11-15 DIAGNOSIS — J431 Panlobular emphysema: Secondary | ICD-10-CM

## 2020-11-15 DIAGNOSIS — E785 Hyperlipidemia, unspecified: Secondary | ICD-10-CM

## 2020-11-15 DIAGNOSIS — I25118 Atherosclerotic heart disease of native coronary artery with other forms of angina pectoris: Secondary | ICD-10-CM

## 2020-11-15 DIAGNOSIS — I5030 Unspecified diastolic (congestive) heart failure: Secondary | ICD-10-CM

## 2020-11-15 DIAGNOSIS — N1831 Chronic kidney disease, stage 3a: Secondary | ICD-10-CM

## 2020-11-15 NOTE — Chronic Care Management (AMB) (Signed)
Chronic Care Management   Pharmacy Note  11/15/2020 Name: MACKEY Daniels MRN: [id number] DOB: 12-27-1945   Subjective:  Victor Daniels is a 74 y.o. year old male who is a primary care patient of Victor Daniels. The CCM team was consulted for assistance with chronic disease management and care coordination needs.    Engaged with patient face to face for follow up visit in response to provider referral for pharmacy case management and/or care coordination services.   Consent to Services:  Mr. Victor Daniels was given information about Chronic Care Management services, agreed to services, and gave verbal consent prior to initiation of services on 08/25/19. Please see initial visit note for detailed documentation.   SDOH (Social Determinants of Health) assessments and interventions performed:  SDOH Interventions     Most Recent Value  SDOH Interventions  Financial Strain Interventions Intervention Not Indicated       Objective:  Lab Results  Component Value Date   CREATININE 1.33 08/17/2020   CREATININE 1.26 (H) 08/03/2020   CREATININE 0.95 07/29/2020    Lab Results  Component Value Date   HGBA1C 6.3 (H) 06/23/2020       Component Value Date/Time   CHOL 123 03/15/2020 1051   TRIG 104.0 03/15/2020 1051   HDL 38.80 (L) 03/15/2020 1051   CHOLHDL 3 03/15/2020 1051   VLDL 20.8 03/15/2020 1051   LDLCALC 63 03/15/2020 1051   LDLDIRECT 72.0 12/01/2016 1459    CHA2DS2-VASc Score = 6  This indicates a 9.7% annual risk of stroke. The patient's score is based upon: CHF History: 1 HTN History: 1 Diabetes History: 0 Stroke History: 2 Vascular Disease History: 1 Age Score: 1 Gender Score: 0    BP Readings from Last 3 Encounters:  08/23/20 (!) 150/80  08/19/20 130/80  08/03/20 (!) 147/80    Assessment/Interventions: Review of patient past medical history, allergies, medications, health status, including review of consultants reports, laboratory and  other test data, was performed as part of comprehensive evaluation and provision of chronic care management services.   Allergies  Allergen Reactions  . Clonidine Derivatives     Reaction unknown  . Sulfa Antibiotics     Reaction unknown    Medications Reviewed Today    Reviewed by Lourena Simmonds, RPH-CPP (Pharmacist) on 11/15/20 at 1334  Med List Status: <None>  Medication Order Taking? Sig Documenting Provider Last Dose Status Informant  albuterol (PROVENTIL) (2.5 MG/3ML) 0.083% nebulizer solution [id number] Yes Take 3 mLs (2.5 mg total) by nebulization every 6 (six) hours as needed for wheezing or shortness of breath. Alford Highland, Daniels Taking Active Nursing Home Medication Administration Guide (MAG)  Fluticasone-Umeclidin-Vilant (TRELEGY ELLIPTA) 100-62.5-25 MCG/INH AEPB [id number] Yes Inhale 1 puff into the lungs daily. [name], Daniels Taking Active   furosemide (LASIX) 40 MG tablet [id number] Yes Take 1 tablet (40 mg total) by mouth 2 (two) times daily. Alford Highland, Daniels Taking Active Nursing Home Medication Administration Guide (MAG)           Med Note Ara Kussmaul Nov 15, 2020  1:27 PM) Taking 20 mg QOD  ipratropium (ATROVENT) 0.03 % nasal spray [id number] Yes ipratropium bromide 21 mcg (0.03 %) nasal spray Provider, Historical, Daniels Taking Active   ipratropium-albuterol (DUONEB) 0.5-2.5 (3) MG/3ML SOLN [id number] Yes USE 1 VIAL IN NEBULIZER EVERY 6 HOURS AS NEEDED FOR WHEEZING [name], Daniels Taking Active   losartan (COZAAR) 25 MG tablet [id number] Yes Take 1 tablet (25  mg total) by mouth daily. [name], Daniels Taking Active   methocarbamol (ROBAXIN) 500 MG tablet [id number] Yes Take 500 mg by mouth every 6 (six) hours as needed.  Provider, Historical, Daniels Taking Active   nitroGLYCERIN (NITROSTAT) 0.4 MG SL tablet [id number] No Place 1 tablet (0.4 mg total) under the tongue every 5 (five) minutes as needed for chest pain. Maximum dose 3 tablets  Patient not  taking: Reported on 11/15/2020   [name], Daniels Not Taking Active Nursing Home Medication Administration Guide (MAG)           Med Note Victor Daniels   Tue Aug 13, 2018  5:33 PM)    oxyCODONE (ROXICODONE) 15 MG immediate release tablet [id number] Yes Take 1 tablet (15 mg total) by mouth every 6 (six) hours as needed. Alford Highland, Daniels Taking Active Nursing Home Medication Administration Guide (MAG)  oxyCODONE (ROXICODONE) 15 MG immediate release tablet [id number]  Take 1 tablet (15 mg total) by mouth every 6 (six) hours as needed for pain. [name], Daniels  Active   OXYGEN [id number] Yes Inhale 2 L into the lungs 3 (three) times daily as needed (shortness of breath).  Provider, Historical, Daniels Taking Active Nursing Home Medication Administration Guide (MAG)  rivaroxaban (XARELTO) 20 MG TABS tablet [id number] Yes Take 20 mg by mouth daily with supper. Provider, Historical, Daniels Taking Active   simvastatin (ZOCOR) 20 MG tablet [id number] Yes Take 1 tablet (20 mg total) by mouth at bedtime. [name], Daniels Taking Active   spironolactone (ALDACTONE) 25 MG tablet [id number] Yes Take 1 tablet by mouth once daily [name], Daniels Taking Active   VENTOLIN HFA 108 (90 Base) MCG/ACT inhaler [id number] Yes INHALE 2 PUFFS INTO LUNGS EVERY 6 HOURS AS NEEDED FOR WHEEZING FOR SHORTNESS OF BREATH [name], Daniels Taking Active   vitamin B-12 (CYANOCOBALAMIN) 1000 MCG tablet [id number] Yes Take 1,000 mcg by mouth daily. Provider, Historical, Daniels Taking Active Nursing Home Medication Administration Guide (MAG)          Patient Active Problem List   Diagnosis Date Noted  . Skin ulcer of second toe of left foot, limited to breakdown of skin (HCC) 10/11/2020  . Skin ulcer of fourth toe, left, limited to breakdown of skin (HCC) 10/11/2020  . DVT (deep venous thrombosis) (HCC)   . Acute on chronic respiratory failure with hypoxia and hypercapnia (HCC)   . Leukocytosis   . Stroke (HCC)   . Hematuria,  gross 07/19/2020  . Hospital discharge follow-up 07/04/2020  . Acquired thrombophilia (HCC) 07/04/2020  . Swelling   . Atrial fibrillation (HCC)   . Acute on chronic diastolic CHF (congestive heart failure) (HCC) 06/21/2020  . CHF (congestive heart failure), NYHA class I, acute on chronic, diastolic (HCC) 06/21/2020  . CKD (chronic kidney disease), stage IIIa 03/30/2020  . Diabetic foot ulcer associated with type 2 diabetes mellitus (HCC) 07/23/2019  . Degenerative joint disease of shoulder region 09/26/2018  . Obstructive sleep apnea 11/22/2016  . Hypogonadism male 07/07/2016  . Obesity 06/03/2016  . Reduced libido 05/11/2016  . Visit for preventive health examination 03/01/2016  . Diabetes mellitus with circulatory complication (HCC) 03/01/2016  . Shoulder pain, left 08/03/2015  . Pneumonia 04/13/2015  . Marital conflict involving estrangement 04/13/2015  . Cervical radiculopathy due to degenerative joint disease of spine 03/24/2015  . Iron deficiency anemia 02/25/2015  . Fatigue 02/25/2015  . Nocturia 02/25/2015  . Pernicious anemia 01/14/2015  . S/P  hip replacement 12/12/2014  . S/p nephrectomy 09/07/2014  . Postoperative state 09/07/2014  . Vertigo, central 08/20/2014  . Senile purpura (HCC) 06/28/2014  . Incomplete emptying of bladder 10/08/2013  . Anemia 09/11/2013  . Chest pain with high risk for cardiac etiology 04/08/2013  . COPD with acute exacerbation (HCC) 03/05/2013  . Benign localized prostatic hyperplasia with lower urinary tract symptoms (LUTS) 10/23/2012  . Hematospermia 10/23/2012  . Sciatica 10/23/2012  . Venous insufficiency (chronic) (peripheral) 07/08/2012  . Venous insufficiency   . History of alcohol abuse   . Vasomotor rhinitis 12/06/2011  . CAD (coronary artery disease) 10/12/2011  . COPD (chronic obstructive pulmonary disease) (HCC)   . CHF with left ventricular diastolic dysfunction, NYHA class 2 (HCC)   . Hyperlipidemia   . Hypertension      Medication Assistance: None required. Patient affirms current coverage meets needs. Over income for assistance.    Patient Care Plan: Medication Management    Problem Identified: Heart Failure, COPD     Long-Range Goal: Disease Progression Preventoin   This Visit's Progress: On track  Note:   Current Barriers:  . Unable to independently monitor therapeutic efficacy . Complex patient with multiple comorbidities including heart failure, COPD  Pharmacist Clinical Goal(s):  Marland Kitchen. Over the next 90 days, patient will maintain control of heart failure as evidenced by no unnecessary hospitalizations . Over the next 90 days, patient will adhere to prescribed medication regimen.  Interventions: . Inter-disciplinary care team collaboration (see longitudinal plan of care) . Comprehensive medication review performed; medication list updated in electronic medical record  Overall Health Concerns: . Does report more confusion/episodes of forgetfulness lately. Notes he will walk into a room and not be able to remember why he is there. Notes more nightmares and does endorse occasional "seeing things that aren't there", self-describes as visual hallucinations. Denies that his O2 sat drops during these episodes.  . Notes continued concerns with neuropathic pain in hands and feet. Could consider use of low dose gabapentin at bedtime vs duloxetine. May also provide benefit for chronic pain.  . Patient received initial 2 dose regimen Pfizer COVID vaccine. Advised to get yearly high dose influenza vaccine.  Heart Failure (recovered, EF 55%): Marland Kitchen. Controlled; current treatment: losartan 25 mg QPM, furosemide 40 mg QAM prescribed, but taking 20 mg every other day because he reports too much polyuria with daily administration; spironolactone 25 mg QAM . Current home weights: reports ~16 lbs weight loss over the past 6 months; daily weight 265-270s . Noted that he did not attend Dr. Windell HummingbirdGollan's appointment last week  because he did not want to see an APP. Marland Kitchen. Recommended continuation of current regimen. Recommend continuation of daily weights, minimization of sodium intake. [facility] for [facility] and lifestyle counseling. Encouraged to call and schedule f/u with Dr. Mariah MillingGollan. Reviewed collaborative nature of the clinic, even if he doesn't see Dr. Mariah MillingGollan himself.   Hyperlipidemia and Secondary ASCVD Prevention - hx MI, CVA x2 . Controlled; current treatment: simvastatin 20 mg daily  . No antiplatelet therapy d/t concurrent anticoagulant . Recommended to continue current regimen   Chronic Obstructive Pulmonary Disease: Marland Kitchen. Uncontrolled/controlled; current treatment: Trelegy 100/62.5/25 mcg daily, albuterol HFA PRN or Duonebs TID; oxygen 24 hours; reports only using rescue therapy max three times daily recently . GOLD Classification: D . Recommended to continue current regimen.  Atrial Fibrillation: . Appropriately managed; current rate/rhythm control: none d/t bradycardia; anticoagulant treatment: Xarelto 20 mg daily- receiving from [employer]  through the end of the year until Coverage Gap resets. . Recommended to fill Xarelto one more time before the end of the year, then switch back to fills from his regular pharmacy.   Chronic Pain: . Appropriately managed at this time; oxycodone 15 mg Q6H PRN, notes that he is now limiting to no more than 4 doses daily (had been periodically taking more). PRN methocarbamol 500 mg . Recommend to continue current regimen and collaboration with Dr. Darrick Huntsman at this time.  Patient Goals/Self-Care Activities . Over the next  days, patient will:  - take medications as prescribed weigh daily, and contact provider if weight gain of >3 lbs/day or 5 lbs/week collaborate with provider on medication access solutions  Follow Up Plan: Telephone follow up appointment with care management team member scheduled for: ~ 8 weeks      Plan:  Telephone follow up appointment with care management team member scheduled for: ~ 8 weeks  Catie Feliz Beam, PharmD, [place], CPP Clinical Pharmacist [facility] [employer] [phone]

## 2020-11-15 NOTE — Patient Instructions (Addendum)
Victor Daniels,   It was great talking with you today!  You should:  - Get the high dose influenza vaccine as soon as possible. - Schedule follow up with Dr Darrick Huntsman (if you haven't already) - Schedule follow up with Dr. Mariah Milling  Keep up the great work taking your medications. This is what I have:   Morning: - Furosemide (you are prescribed 40 mg daily, but you self-decreased to 20 mg every other day. Continue to monitor your weights) - Spironolactone 25 mg   Evening: - Xarelto 20 mg (with food) - Simvastatin 20 mg  - Losartan 25 mg daily  Call me with any questions or concerns!  Catie Feliz Beam, PharmD [phone]   Visit Information    Patient Care Plan: Medication Management    Problem Identified: Heart Failure, COPD     Long-Range Goal: Disease Progression Preventoin   This Visit's Progress: On track  Note:   Current Barriers:  . Unable to independently monitor therapeutic efficacy . Complex patient with multiple comorbidities including heart failure, COPD  Pharmacist Clinical Goal(s):  Marland Kitchen Over the next 90 days, patient will maintain control of heart failure as evidenced by no unnecessary hospitalizations . Over the next 90 days, patient will adhere to prescribed medication regimen.  Interventions: . Inter-disciplinary care team collaboration (see longitudinal plan of care) . Comprehensive medication review performed; medication list updated in electronic medical record  Overall Health Concerns: . Does report more confusion/episodes of forgetfulness lately. Notes he will walk into a room and not be able to remember why he is there. Notes more nightmares and does endorse occasional "seeing things that aren't there", self-describes as visual hallucinations. Denies that his O2 sat drops during these episodes.  . Notes continued concerns with neuropathic pain in hands and feet. Could consider use of low dose gabapentin at bedtime vs duloxetine. May also provide benefit  for chronic pain.  . Patient received initial 2 dose regimen Pfizer COVID vaccine. Advised to get yearly high dose influenza vaccine.  Heart Failure (recovered, EF 55%): Marland Kitchen Controlled; current treatment: losartan 25 mg QPM, furosemide 40 mg QAM prescribed, but taking 20 mg every other day because he reports too much polyuria with daily administration; spironolactone 25 mg QAM . Current home weights: reports ~16 lbs weight loss over the past 6 months; daily weight 265-270s . Noted that he did not attend Dr. Windell Hummingbird appointment last week because he did not want to see an APP. Marland Kitchen Recommended continuation of current regimen. Recommend continuation of daily weights, minimization of sodium intake. Continue collaboration w/ [facility] RN for dietary and lifestyle counseling. Encouraged to call and schedule f/u with Dr. Mariah Milling. Reviewed collaborative nature of the clinic, even if he doesn't see Dr. Mariah Milling himself.   Hyperlipidemia and Secondary ASCVD Prevention - hx MI, CVA x2 . Controlled; current treatment: simvastatin 20 mg daily  . No antiplatelet therapy d/t concurrent anticoagulant . Recommended to continue current regimen   Chronic Obstructive Pulmonary Disease: Marland Kitchen Uncontrolled/controlled; current treatment: Trelegy 100/62.5/25 mcg daily, albuterol HFA PRN or Duonebs TID; oxygen 24 hours; reports only using rescue therapy max three times daily recently . GOLD Classification: D . Recommended to continue current regimen.  Atrial Fibrillation: . Appropriately managed; current rate/rhythm control: none d/t bradycardia; anticoagulant treatment: Xarelto 20 mg daily- receiving from [facility] savings program through the end of the year until Coverage Gap resets. . Recommended to fill Xarelto one more time before the end of the year, then switch back to fills from his  regular pharmacy.   Chronic Pain: . Appropriately managed at this time; oxycodone 15 mg Q6H PRN, notes that he is now limiting to no more  than 4 doses daily (had been periodically taking more). PRN methocarbamol 500 mg . Recommend to continue current regimen and collaboration with Dr. Darrick Huntsman at this time.  Patient Goals/Self-Care Activities . Over the next  days, patient will:  - take medications as prescribed weigh daily, and contact provider if weight gain of >3 lbs/day or 5 lbs/week collaborate with provider on medication access solutions  Follow Up Plan: Telephone follow up appointment with care management team member scheduled for: ~ 8 weeks      The patient verbalized understanding of instructions, educational materials, and care plan provided today and agreed to receive a mailed copy of patient instructions, educational materials, and care plan.    Plan: Telephone follow up appointment with care management team member scheduled for: ~ 8 weeks  Catie Feliz Beam, PharmD, [place], CPP Clinical Pharmacist [facility] [employer] [phone]

## 2020-11-22 ENCOUNTER — Other Ambulatory Visit: Payer: Self-pay | Admitting: Internal Medicine

## 2020-11-22 DIAGNOSIS — I5030 Unspecified diastolic (congestive) heart failure: Secondary | ICD-10-CM

## 2020-12-04 IMAGING — DX DG CHEST 1V PORT
1 series · 2 of 2 positions shown · non-contrast
Comparison: June 21, 2020

CLINICAL DATA: Shortness of breath.  Previous right-sided lobectomy

EXAM:
PORTABLE CHEST 1 VIEW

[Series 1: chest ap · 0.14mm/px · 2 of 2 slices shown]
[im 1/2]
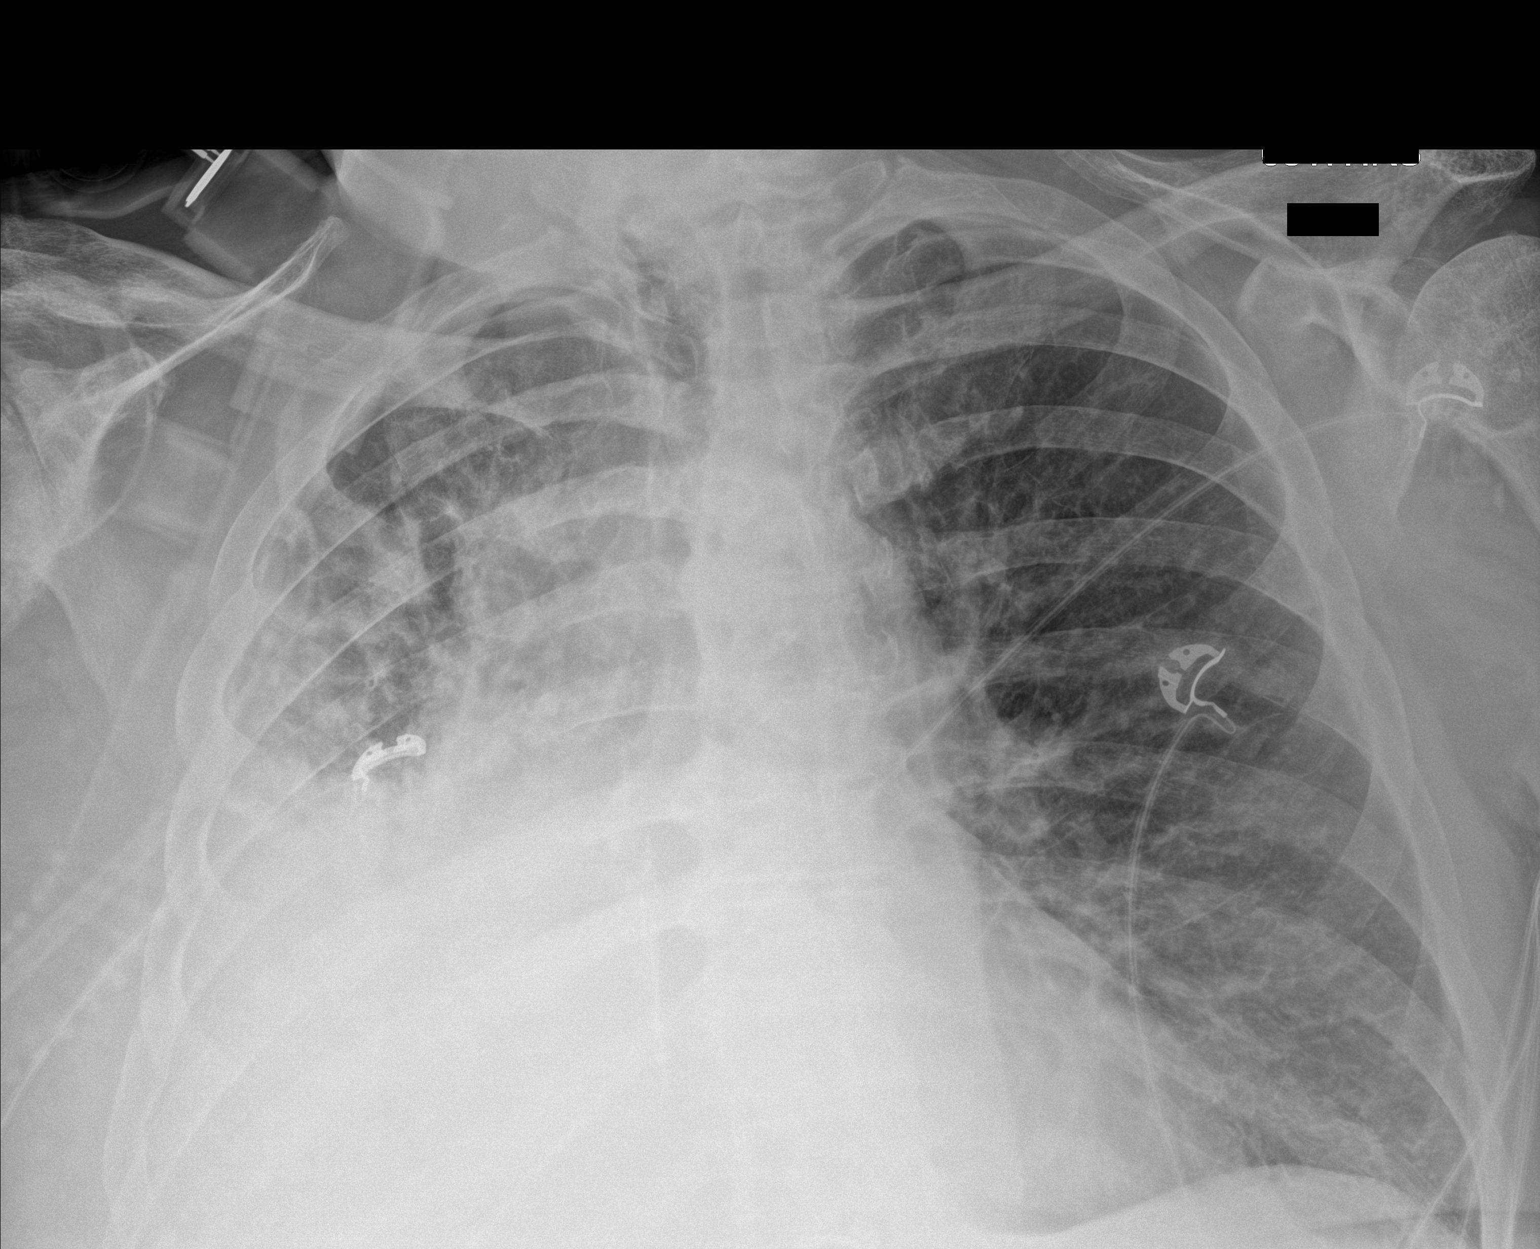
[im 2/2]
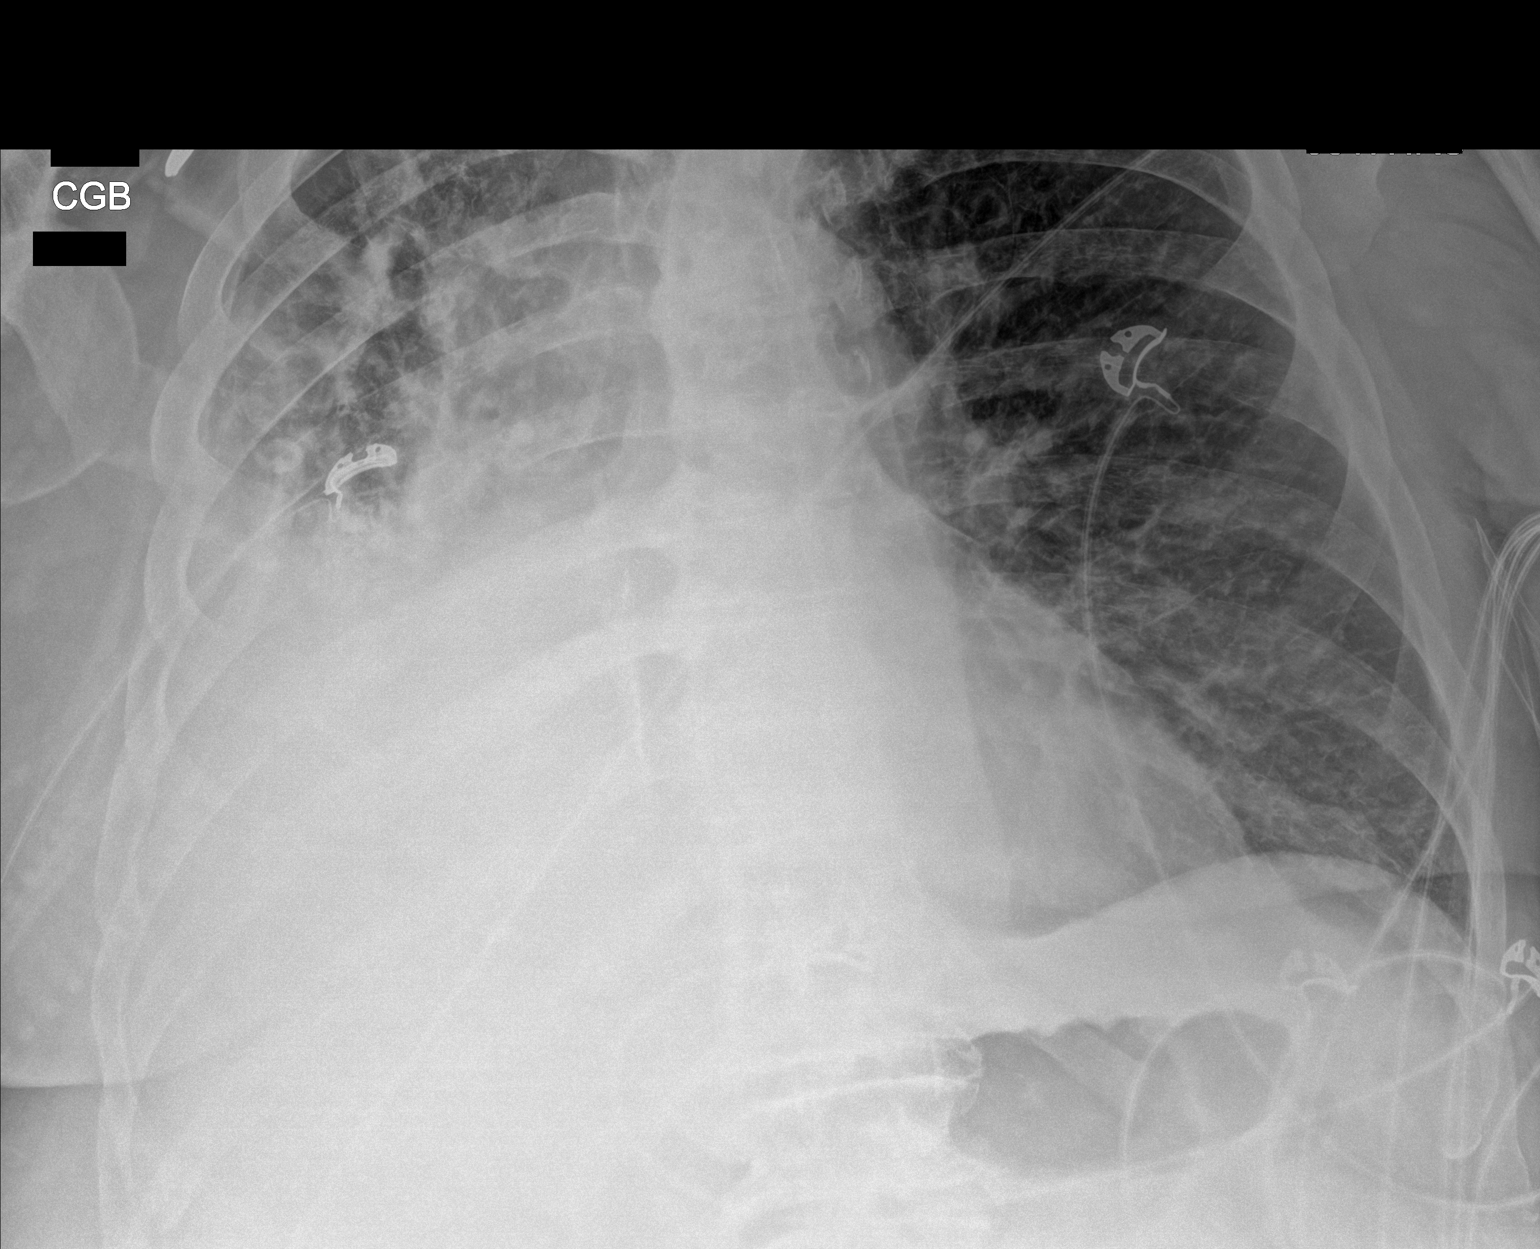

[2 of 2 positions shown; findings below may reference images not displayed]

FINDINGS: Postoperative volume loss on the right. There is a persistent right
pleural effusion with right base consolidation. There is
cardiomegaly with pulmonary venous hypertension. There is
interstitial pulmonary edema, stable. No new opacity evident. No
appreciable adenopathy. No bone lesions.
IMPRESSION: Findings felt to be indicative of a degree of congestive heart
failure. Postoperative change with volume loss on the right.
Appearance similar to 2 days prior.

## 2020-12-06 DIAGNOSIS — J449 Chronic obstructive pulmonary disease, unspecified: Secondary | ICD-10-CM | POA: Diagnosis not present

## 2020-12-07 ENCOUNTER — Ambulatory Visit: Payer: Medicare HMO | Admitting: Pharmacist

## 2020-12-07 ENCOUNTER — Telehealth: Payer: Self-pay

## 2020-12-07 ENCOUNTER — Telehealth: Payer: Self-pay | Admitting: Internal Medicine

## 2020-12-07 DIAGNOSIS — E785 Hyperlipidemia, unspecified: Secondary | ICD-10-CM

## 2020-12-07 DIAGNOSIS — I5030 Unspecified diastolic (congestive) heart failure: Secondary | ICD-10-CM

## 2020-12-07 DIAGNOSIS — J431 Panlobular emphysema: Secondary | ICD-10-CM

## 2020-12-07 DIAGNOSIS — N1831 Chronic kidney disease, stage 3a: Secondary | ICD-10-CM

## 2020-12-07 DIAGNOSIS — I4819 Other persistent atrial fibrillation: Secondary | ICD-10-CM

## 2020-12-07 DIAGNOSIS — N401 Enlarged prostate with lower urinary tract symptoms: Secondary | ICD-10-CM

## 2020-12-07 MED ORDER — OXYCODONE HCL 15 MG PO TABS: 15.0000 mg | ORAL_TABLET | Freq: Four times a day (QID) | ORAL | 0 refills | Status: DC | PRN

## 2020-12-07 NOTE — Chronic Care Management (AMB) (Signed)
Chronic Care Management   Pharmacy Note  12/07/2020 Name: Victor Daniels MRN: [id number] DOB: [birth year]  Subjective:  Victor Daniels is a 74 y.o. year old male who is a primary care patient of Tullo, Mar Daring, MD. The CCM team was consulted for assistance with chronic disease management and care coordination needs.    Patient contacted me today by telephone for medication concerns. Working together in response to provider referral for pharmacy case management and/or care coordination services.   Consent to Services:  Victor Daniels was given information about Chronic Care Management services, agreed to services, and gave verbal consent prior to initiation of services on 08/25/19. Please see initial visit note for detailed documentation.   Objective:  Lab Results  Component Value Date   CREATININE 1.33 08/17/2020   CREATININE 1.26 (H) 08/03/2020   CREATININE 0.95 07/29/2020    Lab Results  Component Value Date   HGBA1C 6.3 (H) 06/23/2020       Component Value Date/Time   CHOL 123 03/15/2020 1051   TRIG 104.0 03/15/2020 1051   HDL 38.80 (L) 03/15/2020 1051   CHOLHDL 3 03/15/2020 1051   VLDL 20.8 03/15/2020 1051   LDLCALC 63 03/15/2020 1051   LDLDIRECT 72.0 12/01/2016 1459   CHA2DS2-VASc Score = 6  This indicates a 9.7% annual risk of stroke. The patient's score is based upon: CHF History: Yes HTN History: Yes Diabetes History: No Stroke History: Yes Vascular Disease History: Yes Age Score: 1 Gender Score: 0  BP Readings from Last 3 Encounters:  08/23/20 (!) 150/80  08/19/20 130/80  08/03/20 (!) 147/80    Assessment/Interventions: Review of patient past medical history, allergies, medications, health status, including review of consultants reports, laboratory and other test data, was performed as part of comprehensive evaluation and provision of chronic care management services.   SDOH (Social Determinants of Health) assessments and  interventions performed:  SDOH Interventions   Flowsheet Row Most Recent Value  SDOH Interventions   Financial Strain Interventions Other (Comment)  [[employer] assistance program]       CCM Care Plan  Allergies  Allergen Reactions  . Clonidine Derivatives     Reaction unknown  . Sulfa Antibiotics     Reaction unknown    Medications Reviewed Today    Reviewed by Lourena Simmonds, RPH-CPP (Pharmacist) on 11/15/20 at 1334  Med List Status: <None>  Medication Order Taking? Sig Documenting Provider Last Dose Status Informant  albuterol (PROVENTIL) (2.5 MG/3ML) 0.083% nebulizer solution [id number] Yes Take 3 mLs (2.5 mg total) by nebulization every 6 (six) hours as needed for wheezing or shortness of breath. Alford Highland, MD Taking Active Nursing Home Medication Administration Guide (MAG)  Fluticasone-Umeclidin-Vilant (TRELEGY ELLIPTA) 100-62.5-25 MCG/INH AEPB [id number] Yes Inhale 1 puff into the lungs daily. [name], MD Taking Active   furosemide (LASIX) 40 MG tablet [id number] Yes Take 1 tablet (40 mg total) by mouth 2 (two) times daily. Alford Highland, MD Taking Active Nursing Home Medication Administration Guide (MAG)           Med Note Ara Kussmaul Nov 15, 2020  1:27 PM) Taking 20 mg QOD  ipratropium (ATROVENT) 0.03 % nasal spray [id number] Yes ipratropium bromide 21 mcg (0.03 %) nasal spray [provider] Taking Active   ipratropium-albuterol (DUONEB) 0.5-2.5 (3) MG/3ML SOLN [id number] Yes USE 1 VIAL IN NEBULIZER EVERY 6 HOURS AS NEEDED FOR WHEEZING [name], MD Taking Active   losartan (COZAAR) 25 MG tablet [id number]  Yes Take 1 tablet (25 mg total) by mouth daily. Sherlene Shamsullo, Teresa L, MD Taking Active   methocarbamol (ROBAXIN) 500 MG tablet [id number] Yes Take 500 mg by mouth every 6 (six) hours as needed.  [provider] Taking Active   nitroGLYCERIN (NITROSTAT) 0.4 MG SL tablet [id number] No Place 1 tablet (0.4 mg total) under the tongue  every 5 (five) minutes as needed for chest pain. Maximum dose 3 tablets  Patient not taking: Reported on 11/15/2020   Sherlene Shamsullo, Teresa L, MD Not Taking Active Nursing Home Medication Administration Guide (MAG)           Med Note Larose Hires(TRAMBLE, ANNA E   Tue Aug 13, 2018  5:33 PM)    oxyCODONE (ROXICODONE) 15 MG immediate release tablet [id number] Yes Take 1 tablet (15 mg total) by mouth every 6 (six) hours as needed. Alford HighlandWieting, Richard, MD Taking Active Nursing Home Medication Administration Guide (MAG)  oxyCODONE (ROXICODONE) 15 MG immediate release tablet [id number]  Take 1 tablet (15 mg total) by mouth every 6 (six) hours as needed for pain. Sherlene Shamsullo, Teresa L, MD  Active   OXYGEN [id number] Yes Inhale 2 L into the lungs 3 (three) times daily as needed (shortness of breath).  [provider] Taking Active Nursing Home Medication Administration Guide (MAG)  rivaroxaban (XARELTO) 20 MG TABS tablet [id number] Yes Take 20 mg by mouth daily with supper. [provider] Taking Active   simvastatin (ZOCOR) 20 MG tablet [id number] Yes Take 1 tablet (20 mg total) by mouth at bedtime. Sherlene Shamsullo, Teresa L, MD Taking Active   spironolactone (ALDACTONE) 25 MG tablet [id number] Yes Take 1 tablet by mouth once daily Sherlene Shamsullo, Teresa L, MD Taking Active   VENTOLIN HFA 108 (90 Base) MCG/ACT inhaler [id number] Yes INHALE 2 PUFFS INTO LUNGS EVERY 6 HOURS AS NEEDED FOR WHEEZING FOR SHORTNESS OF BREATH Sherlene Shamsullo, Teresa L, MD Taking Active   vitamin B-12 (CYANOCOBALAMIN) 1000 MCG tablet [id number] Yes Take 1,000 mcg by mouth daily. [provider] Taking Active Nursing Home Medication Administration Guide (MAG)          Patient Active Problem List   Diagnosis Date Noted  . Skin ulcer of second toe of left foot, limited to breakdown of skin (HCC) 10/11/2020  . Skin ulcer of fourth toe, left, limited to breakdown of skin (HCC) 10/11/2020  . DVT (deep venous thrombosis) (HCC)   . Acute on chronic respiratory failure  with hypoxia and hypercapnia (HCC)   . Leukocytosis   . Stroke (HCC)   . Hematuria, gross 07/19/2020  . Hospital discharge follow-up 07/04/2020  . Acquired thrombophilia (HCC) 07/04/2020  . Swelling   . Atrial fibrillation (HCC)   . Acute on chronic diastolic CHF (congestive heart failure) (HCC) 06/21/2020  . CHF (congestive heart failure), NYHA class I, acute on chronic, diastolic (HCC) 06/21/2020  . CKD (chronic kidney disease), stage IIIa 03/30/2020  . Diabetic foot ulcer associated with type 2 diabetes mellitus (HCC) 07/23/2019  . Degenerative joint disease of shoulder region 09/26/2018  . Obstructive sleep apnea 11/22/2016  . Hypogonadism male 07/07/2016  . Obesity 06/03/2016  . Reduced libido 05/11/2016  . Visit for preventive health examination 03/01/2016  . Diabetes mellitus with circulatory complication (HCC) 03/01/2016  . Shoulder pain, left 08/03/2015  . Pneumonia 04/13/2015  . Marital conflict involving estrangement 04/13/2015  . Cervical radiculopathy due to degenerative joint disease of spine 03/24/2015  . Iron deficiency anemia 02/25/2015  . Fatigue 02/25/2015  . Nocturia 02/25/2015  . Pernicious  anemia 01/14/2015  . S/P hip replacement 12/12/2014  . S/p nephrectomy 09/07/2014  . Postoperative state 09/07/2014  . Vertigo, central 08/20/2014  . Senile purpura (HCC) 06/28/2014  . Incomplete emptying of bladder 10/08/2013  . Anemia 09/11/2013  . Chest pain with high risk for cardiac etiology 04/08/2013  . COPD with acute exacerbation (HCC) 03/05/2013  . Benign localized prostatic hyperplasia with lower urinary tract symptoms (LUTS) 10/23/2012  . Hematospermia 10/23/2012  . Sciatica 10/23/2012  . Venous insufficiency (chronic) (peripheral) 07/08/2012  . Venous insufficiency   . History of alcohol abuse   . Vasomotor rhinitis 12/06/2011  . CAD (coronary artery disease) 10/12/2011  . COPD (chronic obstructive pulmonary disease) (HCC)   . CHF with left ventricular  diastolic dysfunction, NYHA class 2 (HCC)   . Hyperlipidemia   . Hypertension     Conditions to be addressed/monitored per PCP order: CHF, CAD and COPD    Patient Care Plan: Medication Management    Problem Identified: Heart Failure, COPD     Long-Range Goal: Disease Progression Preventoin   Recent Progress: On track  Note:   Current Barriers:  . Unable to independently monitor therapeutic efficacy . Complex patient with multiple comorbidities including heart failure, COPD  Pharmacist Clinical Goal(s):  Marland Kitchen Over the next 90 days, patient will maintain control of heart failure as evidenced by no unnecessary hospitalizations . Over the next 90 days, patient will adhere to prescribed medication regimen.  Interventions: . Inter-disciplinary care team collaboration (see longitudinal plan of care) . Comprehensive medication review performed; medication list updated in electronic medical record  Health Maintenance: . Calls today to report a week's history of periodic hematuria. Notes that it started off as a small amount, but urine is now bright red. Notes that he stopped furosemide to avoid increased polyuria. Does not remember seeing Dr. Lonna Cobb in October . Encouraged patient to call urology. Received call back from patient that they encouraged him to call PCP. Upon chart review, it appears patient asked about furosemide causing concerns with Eliquis. Called urology CMA back and explained what patient had told me. She is going to call patient to triage.   Heart Failure (recovered, EF 55%): Marland Kitchen Controlled; current treatment: losartan 25 mg QPM, furosemide 40 mg QAM prescribed, but notes that he is holding right now d/t hematuria; spironolactone 25 mg QAM . Advised to schedule f/u with cardiology.   Hyperlipidemia and Secondary ASCVD Prevention - hx MI, CVA x2 . Controlled; current treatment: simvastatin 20 mg daily  . No antiplatelet therapy d/t concurrent anticoagulant . Recommended to  continue current regimen   Chronic Obstructive Pulmonary Disease: Marland Kitchen Uncontrolled/controlled; current treatment: Trelegy 100/62.5/25 mcg daily, albuterol HFA PRN or Duonebs TID; oxygen 24 hours; reports only using rescue therapy max three times daily recently . GOLD Classification: D . Recommended to continue current regimen.  Atrial Fibrillation: . Appropriately managed; current rate/rhythm control: none d/t bradycardia; anticoagulant treatment: Xarelto 20 mg daily- receiving from [facility] savings program through the end of the year until Coverage Gap resets. . Recommended to fill Xarelto one more time before the end of the year, then switch back to fills from his regular pharmacy.   Chronic Pain: . Appropriately managed at this time; oxycodone 15 mg Q6H PRN, notes that he is now limiting to no more than 4 doses daily (had been periodically taking more). PRN methocarbamol 500 mg . Recommend to continue current regimen and collaboration with Dr. Darrick Huntsman at this time.  Patient Goals/Self-Care Activities . Over  the next  days, patient will:  - take medications as prescribed weigh daily, and contact provider if weight gain of >3 lbs/day or 5 lbs/week collaborate with provider on medication access solutions  Follow Up Plan: Telephone follow up appointment with care management team member scheduled for: ~ 4 weeks     Medication Assistance: None required. Patient affirms current coverage meets needs.   Plan: Telephone follow up appointment with care management team member scheduled for: ~ 8 weeks as previously scheduled  Catie Feliz Beam, PharmD, [place], CPP Clinical Pharmacist [employer] at [employer] [phone]

## 2020-12-07 NOTE — Telephone Encounter (Signed)
Pt calls triage line to inquire about whether or not furosemide may be causing him to have issues with anticoagulation. Advised pt that we are urology and would not be able to advise him on the use of medications we did not prescribe. Advised pt to call his PCP. Pt voiced understanding.

## 2020-12-07 NOTE — Patient Instructions (Signed)
Visit Information  Patient Care Plan: Heart Failure (Adult)    Problem Identified: [facility]N Symptom Exacerbation (Heart Failure)   Priority: High  Onset Date: 10/14/2020    Long-Range Goal: Symptom Exacerbation Prevented or Minimized   Start Date: 10/14/2020  Expected End Date: 12/24/2020  Priority: High  Note:   Evidence-based guidance:  Perform or review cognitive and/or health literacy screening.  Assess understanding of adherence and barriers to treatment plan, as well as lifestyle changes; develop strategies to address barriers.  Establish a mutually-agreed-upon early intervention process to communicate with primary care provider when signs/symptoms worsen.  Facilitate timely posthospital discharge or emergency department treatment that includes intensive follow-up via telephone calls, home visit, telehealth monitoring and care at multidisciplinary heart failure clinic.  Adjust frequency and intensity of follow-up based on presentation, number of emergency department visits, hospital admissions and frequency and severity of symptom exacerbation.  Facilitate timely visit, usually within 1 week, with primary care provider following hospital discharge.  Collaborate with clinical pharmacist to address adverse drug reactions, drug interactions, subtherapeutic dosage, patient and family education.  Regularly screen for presence of depressive symptoms using a validated tool; consider pharmacologic therapy and/or referral for cognitive behavioral therapy when present.  Refer to community-based services, such as a heart failure support group, community Medical laboratory scientific officer or peer support program.  Review immunization status; arrange receipt of needed vaccinations.  Prepare patient for home oxygen use based on signs/symptoms.   Notes:    Task: Identify and Minimize Risk of Heart Failure Exacerbation   Due Date: 12/23/2020  Priority: Routine  Note:   Care Management Activities:    - health literacy  screening completed or reviewed - needed immunization facilitated - rescue (action) plan reviewed - self-awareness of signs/symptoms of worsening disease encouraged    Notes: 2nd Covid vaccine scheduled for 11/22/20 , Call PCP office for Flu vaccine    Problem Identified: [facility] Activity Tolerance (Heart Failure)   Priority: Medium  Onset Date: 10/14/2020    Long-Range Goal: Activity Tolerance Optimized   Start Date: 10/14/2020  Priority: Medium  Note:   Evidence-based guidance:  Promote daily physical activity that improves functional ability, cognition and quality of life.  Encourage reduction in sedentary time.   Encourage optimal, safe functional mobility and self-care performance based on ability and tolerance.   Promote breathing and energy conservation techniques, such as pursed-lip breathing, preplanning and pacing of activity, balancing activity and rest.  Encourage participation in cardiac rehabilitation services.   Notes:    Task: Maintain Strength and Functional Ability   Due Date: 12/24/2020  Priority: Routine  Note:   Care Management Activities:    - breathing techniques encouraged - daily activity/exercise promoted - energy conservation techniques promoted - self-care encouraged    Notes:    Patient Care Plan: Wellness (Adult)    Problem Identified: Medication Adherence (Wellness)   Priority: Medium  Onset Date: 10/14/2020    Long-Range Goal: Medication Adherence Maintained   Start Date: 10/14/2020  Priority: Medium  Note:   Evidence-based guidance:  Develop a complete and accurate medication list including those prescribed and over-the-counter, those taken only occasionally and those not taken by mouth such as injections, inhalers, ointments or creams and drops.  Review all medications to determine if patient or caregiver knows why the medications are given and if taken as prescribed.  Complete or review a medication adherence assessment including  barriers to medication adherence.  Arrange and encourage counseling and medication review by pharmacist.  Assess barriers  to medication adherence.  Manage poor understanding or health literacy by using easy to understand language, teach-back, visual aids and teaching only 2 or 3 points at a time.  Assess presence of side effects; provide suggestions to manage or reduce side effects.  Consult with provider and/or pharmacist regarding substitute medication, changing dose, simplification of regimen or safe discontinuation of some medications.  Encourage the use of medication reminders such as clock or cell phone alarm, color coding, pillboxes for am/pm and days of the week, pharmacy refill reminder, auto-refill system or mail-order option.  Assist with resources when cost is a barrier; refer to prescription assistance programs; confirm that generics are prescribed whenever possible; consider 90-day prescriptions to reduce copay cost; synchronize refills.  Provide help to complete medication assistance applications or health insurance forms as needed.  Complete a follow-up call 2 to 3 weeks after medication self-management plan developed; assess adherence and understanding, as well as listen to patient or caregiver concerns; amend plan as needed.  Provide frequent follow-up providing motivation, encouragement and support when medication nonadherence is identified.   Notes:    Task: THN Optimize Medication Use   Due Date: 12/23/2020  Priority: Routine  Note:   Care Management Activities:    - barriers to medication adherence identified - counseling by pharmacist provided - medication list reviewed - understanding of current medications assessed    Notes: Reinforced Taking all medication as prescribed ,[facility] pharmacist ongoing follow up with patient    Patient Care Plan: COPD (Adult)    Problem Identified: Symptom Exacerbation (COPD)   Priority: High  Onset Date: 10/14/2020    Long-Range  Goal: Symptom Exacerbation Prevented or Minimized   Start Date: 10/14/2020  Priority: Medium  Note:   Evidence-based guidance:  Monitor for signs of respiratory infection, including changes in sputum color, volume and thickness, as well as fever.  Encourage infection prevention strategies that may include prophylactic antibiotic therapy for patients with history of frequent exacerbations or antibiotic administration during exacerbation based on presentation, risk and benefit.  Encourage receipt of influenza and pneumococcal vaccine.  Prepare patient for use of home long-term oxygen therapy in presence of sever resting hypoxemia.  Prepare patients for laboratory studies or diagnostic exams, such as spirometry, pulse oximetry and arterial blood gas based on current symptoms, risk factors and presentation.  Assess barriers and manage adherence, including inhaler technique and persistent trigger exposure; encourage adherence, even when symptoms are controlled or infrequent.  Assess and monitor for signs/symptoms of psychosocial concerns, such as shortness of breath-anxiety cycle or depression that may impact stability of symptoms.  Identify economic resources, sociocultural beliefs, social factors and health literacy that may interfere with adherence.  Promote lifestyle changes when needed, including regular physical activity based on tolerance, weight loss, healthy eating and stress management.  Consider referral to nurse or community health worker or home-visiting program for intensive support and education (disease-management program).  Increase frequency of follow-up following exacerbation or hospitalization; consider transition of care interventions, such as hospital visit, home visit, telephone follow-up, review of discharge summary and resource referrals.   Notes:    Task: Identify and Minimize Risk of COPD Exacerbation   Due Date: 12/24/2020  Priority: Routine  Note:   Care Management  Activities:    - breathing techniques encouraged - rescue (action) plan reviewed - signs/symptoms of infection reviewed - signs/symptoms of worsening disease assessed - treatment plan reviewed    Notes:    Patient Care Plan: Medication Management    Problem  Identified: Heart Failure, COPD     Long-Range Goal: Disease Progression Preventoin   Recent Progress: On track  Note:   Current Barriers:  . Unable to independently monitor therapeutic efficacy . Complex patient with multiple comorbidities including heart failure, COPD  Pharmacist Clinical Goal(s):  Marland Kitchen Over the next 90 days, patient will maintain control of heart failure as evidenced by no unnecessary hospitalizations . Over the next 90 days, patient will adhere to prescribed medication regimen.  Interventions: . Inter-disciplinary care team collaboration (see longitudinal plan of care) . Comprehensive medication review performed; medication list updated in electronic medical record  Health Maintenance: . Calls today to report a week's history of periodic hematuria. Notes that it started off as a small amount, but urine is now bright red. Notes that he stopped furosemide to avoid increased polyuria. Does not remember seeing Dr. Lonna Cobb in October . Encouraged patient to call urology. Received call back from patient that they encouraged him to call PCP. Upon chart review, it appears patient asked about furosemide causing concerns with Eliquis. Called urology CMA back and explained what patient had told me. She is going to call patient to triage.   Heart Failure (recovered, EF 55%): Marland Kitchen Controlled; current treatment: losartan 25 mg QPM, furosemide 40 mg QAM prescribed, but notes that he is holding right now d/t hematuria; spironolactone 25 mg QAM . Advised to schedule f/u with cardiology.   Hyperlipidemia and Secondary ASCVD Prevention - hx MI, CVA x2 . Controlled; current treatment: simvastatin 20 mg daily  . No antiplatelet  therapy d/t concurrent anticoagulant . Recommended to continue current regimen   Chronic Obstructive Pulmonary Disease: Marland Kitchen Uncontrolled/controlled; current treatment: Trelegy 100/62.5/25 mcg daily, albuterol HFA PRN or Duonebs TID; oxygen 24 hours; reports only using rescue therapy max three times daily recently . GOLD Classification: D . Recommended to continue current regimen.  Atrial Fibrillation: . Appropriately managed; current rate/rhythm control: none d/t bradycardia; anticoagulant treatment: Xarelto 20 mg daily- receiving from [facility] savings program through the end of the year until Coverage Gap resets. . Recommended to fill Xarelto one more time before the end of the year, then switch back to fills from his regular pharmacy.   Chronic Pain: . Appropriately managed at this time; oxycodone 15 mg Q6H PRN, notes that he is now limiting to no more than 4 doses daily (had been periodically taking more). PRN methocarbamol 500 mg . Recommend to continue current regimen and collaboration with Dr. Darrick Huntsman at this time.  Patient Goals/Self-Care Activities . Over the next  days, patient will:  - take medications as prescribed weigh daily, and contact provider if weight gain of >3 lbs/day or 5 lbs/week collaborate with provider on medication access solutions  Follow Up Plan: Telephone follow up appointment with care management team member scheduled for: ~ 4 weeks      The patient verbalized understanding of instructions, educational materials, and care plan provided today and declined offer to receive copy of patient instructions, educational materials, and care plan.   Medication Assistance: None required. Patient affirms current coverage meets needs.   Plan: Telephone follow up appointment with care management team member scheduled for: ~ 8 weeks as previously scheduled  Catie Feliz Beam, PharmD, [place], CPP Clinical Pharmacist [employer] at [employer] [phone]

## 2020-12-07 NOTE — Telephone Encounter (Signed)
Called pt, he states that he is having blood in his urine, bright red, comes and goes. Denies pain. Pt scheduled for an appointment in first available.Pt voiced understanding.

## 2020-12-07 NOTE — Telephone Encounter (Signed)
Pt needs a refill on oxyCODONE (ROXICODONE) 15 MG immediate release tablet sent to [facility]

## 2020-12-07 NOTE — Telephone Encounter (Signed)
Refill for 30 days only.  OFFICE VISIT NEEDED prior to any more refills 

## 2020-12-09 ENCOUNTER — Other Ambulatory Visit: Payer: Self-pay | Admitting: *Deleted

## 2020-12-09 NOTE — Patient Outreach (Addendum)
[facility]) Care Management  [facility] Care Manager  12/09/2020   Victor Daniels Mar 06, 1946 [id number]   Telephone assessment Chronic care management  Date of Admission :07/29/20 Diagnosis :Acute on Chronic Diastolic heart failure Date of Discharge:08/03/20 Facility :[facility] Insurance:[employer] Transition of care by PCP   Recent Hospital Admission : [facility] 6/28-7/2 Acute on Chronic Diastolic heart failure  [facility] Admission 7/30-8/5 Acute on Chronic Diastolic heart failure [facility] ED visit 07/03/20 Dx: Chest Pain, bronchitis [facility] : 8/5-8/10  PMHx:73 y.o.malewith medical history significant ofhypertension, hyperlipidemia COPD, on 2 L oxygen, stroke, PAD, OSA, CAD, DVT atrial fibrillation on Eliquis,dCHF, alcoholic gastritis,BPH.   Subjective:  Successful outreach call to patient , he states that he is doing fair. He discussed problem when urinating that  Urine is blood colored states it started out mild but go worse of the week. Marland Kitchen He reports making contact with urology office and has an appointment on tomorrow. He states that he has also made Dr. Darrick Huntsman Catie Pharmacist  aware.  Patient denies dizziness, increase in usual shortness of breath and tolerating usual limited activity. He discussed calling [facility] regarding when he can get his Covid vaccine booster states it will be May.  He reports that his weight is staying 255-260 range, checking weight about 3 to 4 days a week. He denies increase in swelling in legs, no weeping or broken skin areas.  Patient states he will give it one more try of rescheduling  Appointment to see Dr. Mariah Milling specifically instead other providers.      Encounter Medications:  Outpatient Encounter Medications as of 12/09/2020  Medication Sig Note  . albuterol (PROVENTIL) (2.5 MG/3ML) 0.083% nebulizer solution Take 3 mLs (2.5 mg total) by nebulization every 6 (six) hours as needed for wheezing or shortness of  breath.   . Fluticasone-Umeclidin-Vilant (TRELEGY ELLIPTA) 100-62.5-25 MCG/INH AEPB Inhale 1 puff into the lungs daily.   . furosemide (LASIX) 40 MG tablet Take 1 tablet (40 mg total) by mouth 2 (two) times daily. 11/15/2020: Taking 20 mg QOD  . ipratropium (ATROVENT) 0.03 % nasal spray ipratropium bromide 21 mcg (0.03 %) nasal spray   . ipratropium-albuterol (DUONEB) 0.5-2.5 (3) MG/3ML SOLN USE 1 VIAL IN NEBULIZER EVERY 6 HOURS AS NEEDED FOR WHEEZING   . losartan (COZAAR) 25 MG tablet Take 1 tablet (25 mg total) by mouth daily.   . methocarbamol (ROBAXIN) 500 MG tablet Take 500 mg by mouth every 6 (six) hours as needed.    . nitroGLYCERIN (NITROSTAT) 0.4 MG SL tablet Place 1 tablet (0.4 mg total) under the tongue every 5 (five) minutes as needed for chest pain. Maximum dose 3 tablets (Patient not taking: Reported on 11/15/2020)   . oxyCODONE (ROXICODONE) 15 MG immediate release tablet Take 1 tablet (15 mg total) by mouth every 6 (six) hours as needed.   Marland Kitchen oxyCODONE (ROXICODONE) 15 MG immediate release tablet Take 1 tablet (15 mg total) by mouth every 6 (six) hours as needed for pain.   . OXYGEN Inhale 2 L into the lungs 3 (three) times daily as needed (shortness of breath).    . rivaroxaban (XARELTO) 20 MG TABS tablet Take 20 mg by mouth daily with supper.   . simvastatin (ZOCOR) 20 MG tablet Take 1 tablet (20 mg total) by mouth at bedtime.   Marland Kitchen spironolactone (ALDACTONE) 25 MG tablet Take 1 tablet by mouth once daily   . VENTOLIN HFA 108 (90 Base) MCG/ACT inhaler INHALE 2 PUFFS INTO LUNGS EVERY 6  HOURS AS NEEDED FOR WHEEZING FOR SHORTNESS OF BREATH   . vitamin B-12 (CYANOCOBALAMIN) 1000 MCG tablet Take 1,000 mcg by mouth daily.    No facility-administered encounter medications on file as of 12/09/2020.    Functional Status:  In your present state of health, do you have any difficulty performing the following activities: 08/09/2020 07/28/2020  Hearing? N N  Vision? N N  Difficulty concentrating  or making decisions? N N  Walking or climbing stairs? [name]  Comment uses walker uses walker  Dressing or bathing? [name]  Comment wife assist at times wife helps  Doing errands, shopping? [name]  Comment wife assist, patient does not drive wife assists  Quarry manager and eating ? N -  Using the Toilet? N -  In the past six months, have you accidently leaked urine? Y -  Comment wears depends -  Do you have problems with loss of bowel control? N -  Managing your Medications? Y -  Comment wife assist at time , [facility] embedded pharmacist -  Managing your Finances? N -  Housekeeping or managing your Housekeeping? Y -  Comment wife does household management -  Some recent data might be hidden    Fall/Depression Screening: Fall Risk  08/09/2020 08/03/2020 07/06/2020  Falls in the past year? 1 1 1   Number falls in past yr: 0 0 1  Injury with Fall? 0 0 0  Risk for fall due to : Impaired balance/gait - History of fall(s);Impaired balance/gait  Follow up Falls prevention discussed Falls evaluation completed Falls prevention discussed   PHQ 2/9 Scores 08/09/2020 08/03/2020 06/29/2020 03/08/2020 05/01/2019 03/06/2019 07/23/2018  PHQ - 2 Score 0 0 1 0 0 0 0  PHQ- 9 Score - - - - 0 - -    Assessment:  Goals Addressed            This Visit's Progress   . Manage My Medicine   On track    Follow Up Date 02/21/21 Timeframe:  Long-Range Goal Priority:  High Start Date:  10/14/20                           Expected End Date:   02/21/21                      - call for medicine refill 2 or 3 days before it runs out - call if I am sick and can't take my medicine - keep a list of all the medicines I take; vitamins and herbals too    Why is this important?   These steps will help you keep on track with your medicines.    Notes:     . Track and Manage Fluids and Swelling   On track    Follow Up Date 02/21/21 Timeframe:  Long-Range Goal Priority:  Medium Start Date:   10/14/20                           Expected End Date:                         - call office if I gain more than 2 pounds in one day or 5 pounds in one week - do ankle pumps when sitting - keep legs up while sitting - track weight in diary - use salt in moderation - watch for  swelling in feet, ankles and legs every day - weigh myself daily    Why is this important?   It is important to check your weight daily and watch how much salt and liquids you have.  It will help you to manage your heart failure.    Notes:     . Track and Manage My Symptoms   On track    Follow Up Date 02/21/21 Timeframe:  Long-Range Goal Priority:  Medium Start Date:  10/14/20                           Expected End Date: 02/21/21                        - follow rescue plan if symptoms flare-up - keep follow-up appointments    Why is this important?   Tracking your symptoms and other information about your health helps your doctor plan your care.  Write down the symptoms, the time of day, what you were doing and what medicine you are taking.  You will soon learn how to manage your symptoms.     Notes:     . Track and Manage Symptoms       Follow Up Date 02/21/21 Timeframe:  Long-Range Goal Priority:  Medium Start Date:   10/14/20                          Expected End Date:  02/21/21                       - bring diary to all appointments - eat more whole grains, fruits and vegetables, lean meats and healthy fats - follow rescue plan if symptoms flare-up - know when to call the doctor - dress right for the weather, hot or cold    Why is this important?   You will be able to handle your symptoms better if you keep track of them.  Making some simple changes to your lifestyle will help.  Eating healthy is one thing you can do to take good care of yourself.    Notes:        Plan:  Follow-up:  Patient agrees to Care Plan and Follow-up.Will schedule follow within agreed upon time frame in 2 months for continued chronic care  management  and is aware to call sooner if new concerns  . Will PCP this visit note as quarterly update.   [name], RN, BSN  [facility] Care Management,Care Management Coordinator  [phone]- Mobile [phone]- Toll Free Main Office

## 2020-12-10 ENCOUNTER — Other Ambulatory Visit: Payer: Self-pay

## 2020-12-10 ENCOUNTER — Ambulatory Visit (INDEPENDENT_AMBULATORY_CARE_PROVIDER_SITE_OTHER): Payer: Medicare HMO | Admitting: Urology

## 2020-12-10 ENCOUNTER — Encounter: Payer: Self-pay | Admitting: Urology

## 2020-12-10 VITALS — BP 140/80 | Ht 72.0 in | Wt 253.0 lb

## 2020-12-10 DIAGNOSIS — R310 Gross hematuria: Secondary | ICD-10-CM

## 2020-12-10 DIAGNOSIS — N401 Enlarged prostate with lower urinary tract symptoms: Secondary | ICD-10-CM | POA: Diagnosis not present

## 2020-12-10 LAB — URINALYSIS, COMPLETE

## 2020-12-10 LAB — MICROSCOPIC EXAMINATION
Bacteria, UA: NONE SEEN
Epithelial Cells (non renal): NONE SEEN /hpf (ref 0–10)
RBC, Urine: >30 /hpf — ABNORMAL HIGH (ref 0–2)
WBC, UA: NONE SEEN /hpf (ref 0–5)
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
## 2020-12-13 ENCOUNTER — Telehealth: Payer: Self-pay

## 2020-12-13 NOTE — Telephone Encounter (Signed)
Incoming call from pt who states that he is schedule for a CT for blood in the urine however this issue has now resolved. Advised pt that we would still recommend evaluation, pt declines moving forward with CT at this time and states he will call back to schedule if the issue reoccurs.

## 2020-12-15 IMAGING — DX DG CHEST 1V PORT
2 series · 2 of 2 positions shown · non-contrast
Comparison: July 23, 2020

CLINICAL DATA: Chest pain and shortness of breath

EXAM:
PORTABLE CHEST 1 VIEW

[chest ap (1 of 2)]
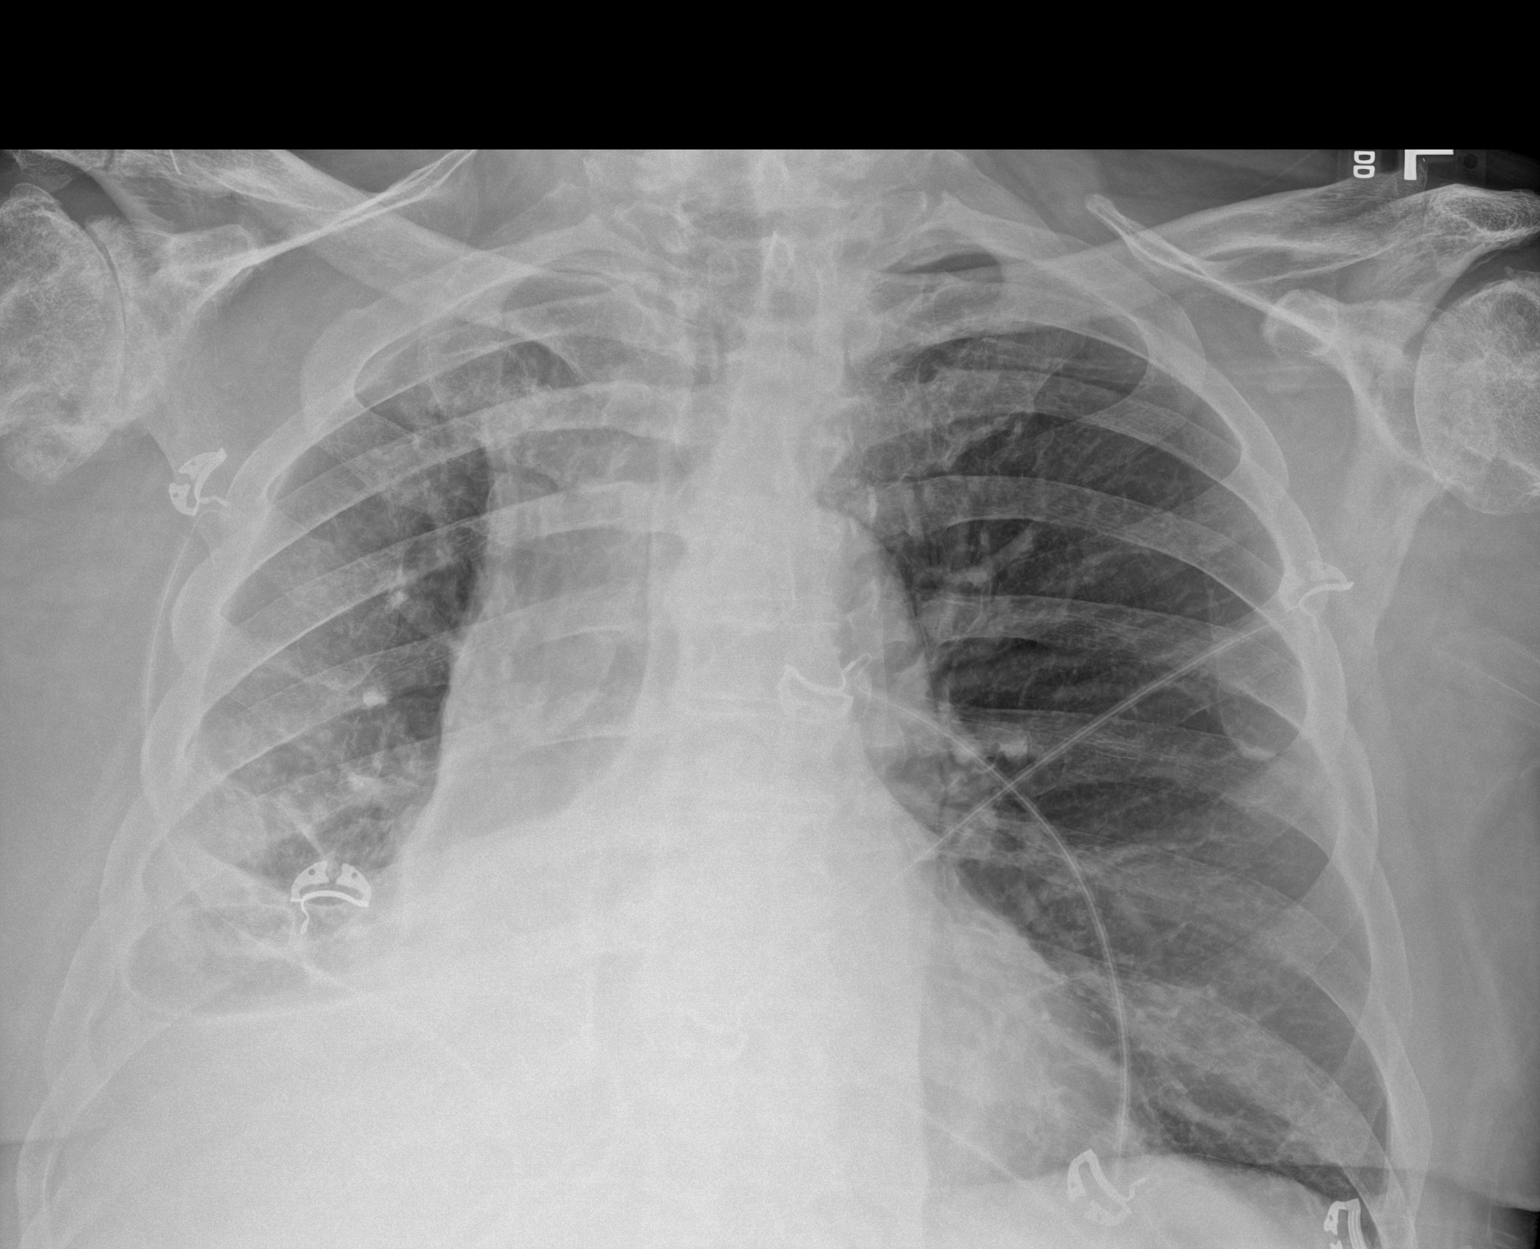

[chest ap (2 of 2)]
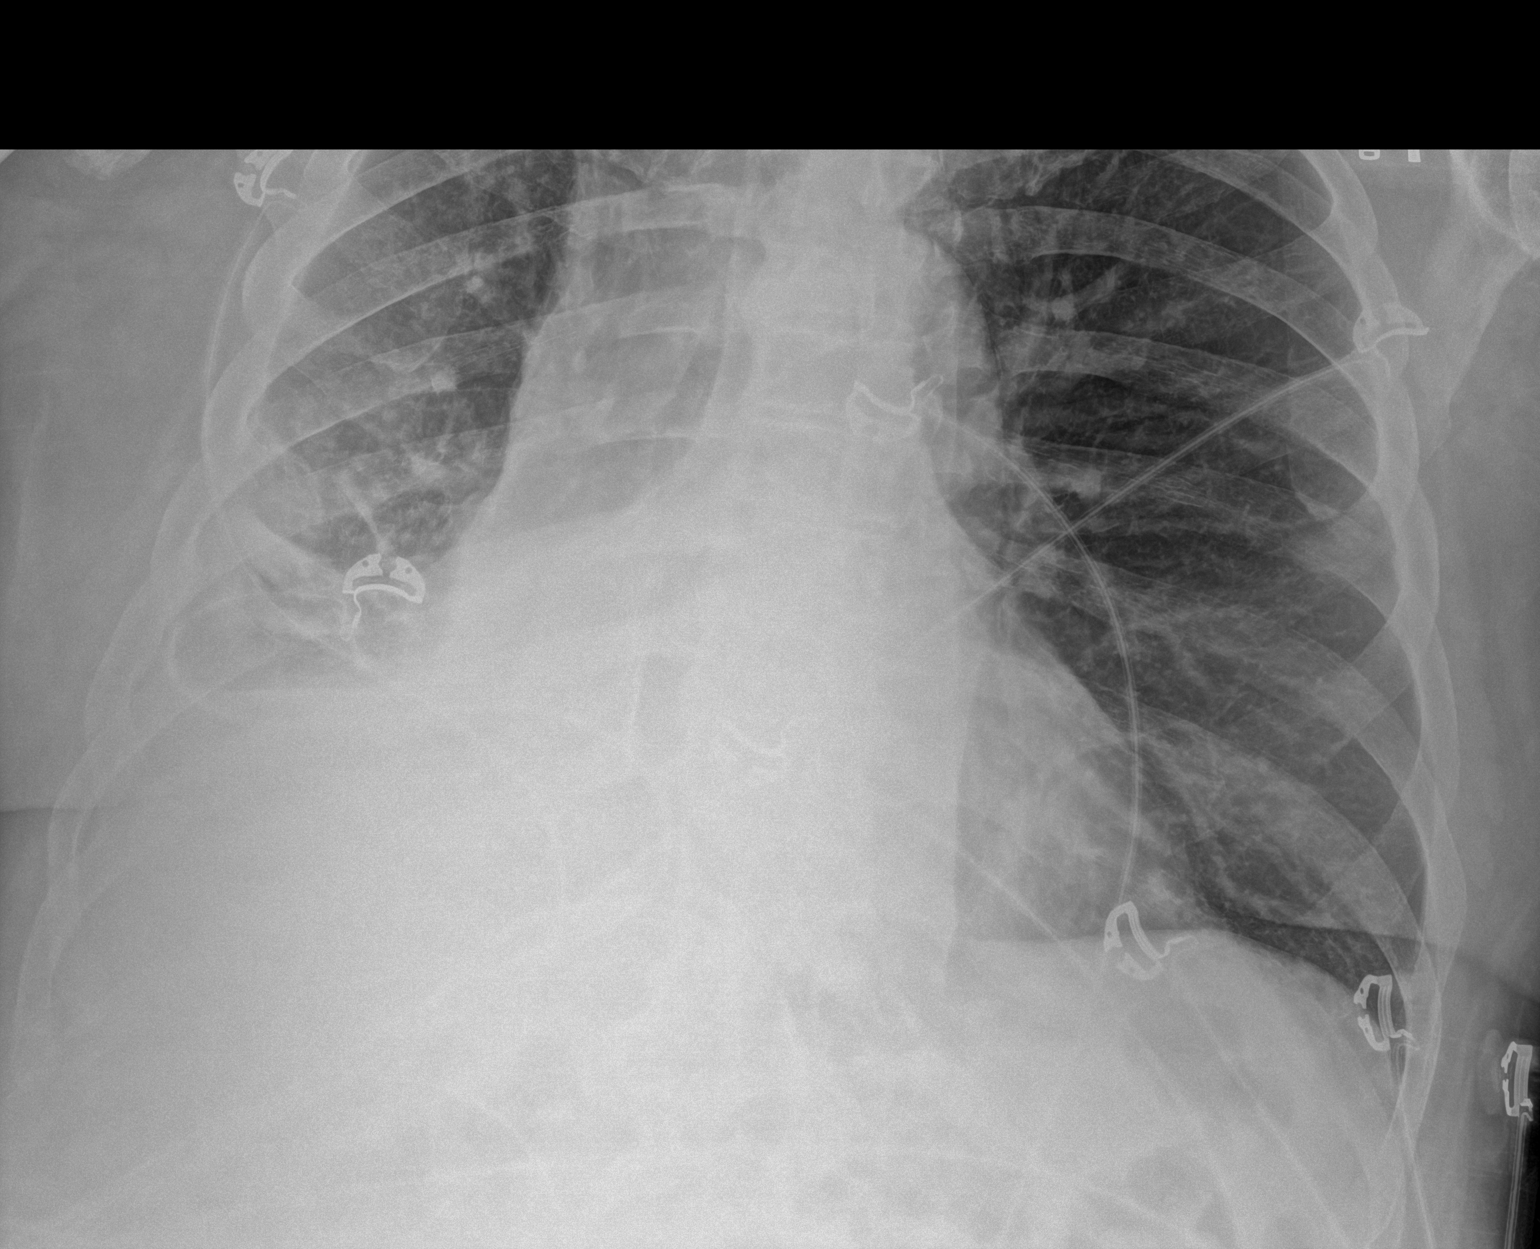

[2 of 2 positions shown; findings below may reference images not displayed]

FINDINGS: The heart size and mediastinal contours are unchanged with mild
cardiomegaly. Again noted are postsurgical changes of a prior right
lobectomy. A small right pleural effusion is present. There is
prominence of the central pulmonary vasculature. Again noted are
advanced bilateral shoulder osteoarthritis.
IMPRESSION: Small right pleural effusion and pulmonary vascular congestion.

## 2020-12-15 NOTE — Telephone Encounter (Signed)
Patient called triage line to discuss further work up regarding hematuria. Would like to proceed with further evaluation with CT.

## 2020-12-15 NOTE — Telephone Encounter (Signed)
Order reopened and Victor Daniels will call him to schedule

## 2020-12-16 ENCOUNTER — Other Ambulatory Visit: Payer: Self-pay | Admitting: Internal Medicine

## 2020-12-16 DIAGNOSIS — I5030 Unspecified diastolic (congestive) heart failure: Secondary | ICD-10-CM

## 2020-12-19 ENCOUNTER — Encounter: Payer: Self-pay | Admitting: Urology

## 2020-12-19 LAB — CULTURE, URINE COMPREHENSIVE

## 2020-12-23 MED ORDER — OXYCODONE HCL 15 MG PO TABS: 15.0000 mg | ORAL_TABLET | Freq: Four times a day (QID) | ORAL | 0 refills | Status: DC | PRN

## 2020-12-23 NOTE — Telephone Encounter (Signed)
Denied for two reasons:  Refill request is too early.  Dec 14 was last refill  #120    It is patient's responsibility to schedule an OV every 3 months .  This should be done at the end of the previous visit ,  Not wait until the last minute   Will refill on jan 13 AS A COURTESY ,  Not before

## 2020-12-23 NOTE — Addendum Note (Signed)
Addended by: [name] on: 12/23/2020 03:12 PM   Modules accepted: Orders

## 2020-12-23 NOTE — Telephone Encounter (Addendum)
Patient called today to ask about oxycodone refill. Relayed the below information.

## 2020-12-23 NOTE — Telephone Encounter (Signed)
Pt called to schedule a med refill pt has been scheduled for 01/19/21 @ 4pm  Pt said that he will be out of medication and this will not work because he needs his medication before this

## 2021-01-07 ENCOUNTER — Telehealth: Payer: Self-pay | Admitting: Internal Medicine

## 2021-01-07 NOTE — Telephone Encounter (Signed)
Patient called in for refill for oxyCODONE (ROXICODONE) 15 MG immediate release tablet he stated that he has not received it yet

## 2021-01-07 NOTE — Telephone Encounter (Signed)
Oxycodone refill was authorized and  sent to [facility]  Over a week ago with refill date jan 13 Please call his  pharmacy

## 2021-01-13 ENCOUNTER — Ambulatory Visit: Payer: Medicare HMO | Admitting: Podiatry

## 2021-01-18 ENCOUNTER — Telehealth: Payer: Self-pay

## 2021-01-18 ENCOUNTER — Telehealth: Payer: Medicare HMO

## 2021-01-18 NOTE — Chronic Care Management (AMB) (Signed)
  Care Management   Note  01/18/2021 Name: Victor Daniels MRN: [id number] DOB: 27-Sep-1946  Victor Daniels is a 75 y.o. year old male who is a primary care patient of Darrick Huntsman, Mar Daring, MD and is actively engaged with the care management team. I reached out to [name] by phone today to assist with re-scheduling a follow up visit with the Pharmacist  Follow up plan: Unsuccessful telephone outreach attempt made. A HIPAA compliant phone message was left for the patient providing contact information and requesting a return call.  The care management team will reach out to the patient again over the next 3 days.  If patient returns call to provider office, please advise to call Embedded Care Management Care Guide Penne Lash  at [phone]  Penne Lash, RMA Care Guide, Embedded Care Coordination [facility]  [place], [place] [address] Direct Dial: [phone] [email or web address]: [email or web address]

## 2021-01-18 NOTE — Chronic Care Management (AMB) (Signed)
  Care Management   Note  01/18/2021 Name: Victor Daniels MRN: [id number] DOB: [birth year]  Victor Daniels is a 75 y.o. year old male who is a primary care patient of Darrick Huntsman, Mar Daring, MD and is actively engaged with the care management team. I reached out to [name] by phone today to assist with re-scheduling a follow up visit with the Pharmacist  Follow up plan: Telephone appointment with care management team member scheduled for:02/03/2021  Penne Lash, RMA Care Guide, Embedded Care Coordination [facility]  [place], [place] [address] Direct Dial: [phone] [email or web address]: [email or web address]

## 2021-01-19 ENCOUNTER — Ambulatory Visit: Payer: Medicare HMO | Admitting: Internal Medicine

## 2021-01-19 ENCOUNTER — Ambulatory Visit: Payer: Medicare HMO

## 2021-01-24 ENCOUNTER — Other Ambulatory Visit: Payer: Self-pay | Admitting: Internal Medicine

## 2021-01-24 DIAGNOSIS — I5030 Unspecified diastolic (congestive) heart failure: Secondary | ICD-10-CM

## 2021-01-28 ENCOUNTER — Other Ambulatory Visit: Payer: Self-pay

## 2021-01-28 NOTE — Patient Outreach (Signed)
[facility]) Care Management  01/28/2021  LOCHLIN EPPINGER 05/28/1946 [id number]   Telephone call to patient for disease management follow up.   No answer.  HIPAA compliant voice message left.    Plan: If no return call, RN CM will attempt patient again in the month of April.   Bary Leriche, RN, MSN [facility] Care Management Care Management Coordinator Direct Line [phone] Cell [phone] Toll Free: [phone]  Fax: [phone]

## 2021-01-31 ENCOUNTER — Telehealth: Payer: Self-pay

## 2021-01-31 ENCOUNTER — Encounter: Payer: Self-pay | Admitting: Internal Medicine

## 2021-01-31 ENCOUNTER — Telehealth (INDEPENDENT_AMBULATORY_CARE_PROVIDER_SITE_OTHER): Payer: Medicare HMO | Admitting: Internal Medicine

## 2021-01-31 ENCOUNTER — Telehealth: Payer: Self-pay | Admitting: Internal Medicine

## 2021-01-31 VITALS — BP 130/112 | Ht 72.01 in | Wt 268.0 lb

## 2021-01-31 DIAGNOSIS — J431 Panlobular emphysema: Secondary | ICD-10-CM | POA: Diagnosis not present

## 2021-01-31 DIAGNOSIS — Z125 Encounter for screening for malignant neoplasm of prostate: Secondary | ICD-10-CM

## 2021-01-31 DIAGNOSIS — E1159 Type 2 diabetes mellitus with other circulatory complications: Secondary | ICD-10-CM | POA: Diagnosis not present

## 2021-01-31 DIAGNOSIS — M4722 Other spondylosis with radiculopathy, cervical region: Secondary | ICD-10-CM

## 2021-01-31 DIAGNOSIS — R310 Gross hematuria: Secondary | ICD-10-CM | POA: Diagnosis not present

## 2021-01-31 DIAGNOSIS — D6869 Other thrombophilia: Secondary | ICD-10-CM

## 2021-01-31 DIAGNOSIS — N1831 Chronic kidney disease, stage 3a: Secondary | ICD-10-CM | POA: Diagnosis not present

## 2021-01-31 MED ORDER — OXYCODONE HCL 15 MG PO TABS: 15.0000 mg | ORAL_TABLET | Freq: Four times a day (QID) | ORAL | 0 refills | Status: DC | PRN

## 2021-01-31 NOTE — Telephone Encounter (Signed)
Dr. Darrick Huntsman wants patient to come into office on 05/03/21 at 1pm to see her and to see Catie.

## 2021-01-31 NOTE — Telephone Encounter (Signed)
Scheduled! I also have a f/u call with him later this week.

## 2021-01-31 NOTE — Telephone Encounter (Signed)
Left message to call back for telephone visit with Dr. Darrick Huntsman
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
## 2021-02-01 MED ORDER — OXYCODONE HCL 15 MG PO TABS: 15.0000 mg | ORAL_TABLET | Freq: Four times a day (QID) | ORAL | 0 refills | Status: DC | PRN

## 2021-02-01 NOTE — Assessment & Plan Note (Signed)
He has bilateral hand numbness aggravated by arthritis.Marland Kitchen  He refuses any further workup given the perceived failure of his anterior cervical fusion in 2006 .  He has at times required use of oxycodone 15 mg every 6 hours prn due to CKD,   But has reduced use to three times daily.  He has not had any ER visits  And has not requested any early refills.   Refill history was confirmed via [place] Controlled Substance database by me today during visit .    Refill for Feb, March and April 2022 authorized

## 2021-02-01 NOTE — Assessment & Plan Note (Addendum)
meloxicam remains suspended and he is using furosemide every other day and spironolactone daily .  MM ruled out with SPEP urine IFE in June 2021    Lab Results  Component Value Date   CREATININE 1.33 08/17/2020

## 2021-02-01 NOTE — Assessment & Plan Note (Signed)
Cystoscopy was normal.  Hematuria occurred in the setting of over anticoagulation  (Xarelto and Eliquis), which he has rectified.  He declines further workup

## 2021-02-01 NOTE — Assessment & Plan Note (Signed)
Remains  well-controlled on diet alone . He is overdue for labs due . Patient is up-to-date on eye exams. Patient has no microalbuminuria. Patient is tolerating statin therapy for CAD risk reduction and on ACE/ARB for renal protection and hypertension      Continue asa and statin.  He has no proteinuria and has annual eye exams. He has deferred the final Pneumovax vaccine.   Lab Results  Component Value Date   HGBA1C 6.3 (H) 06/23/2020   Lab Results  Component Value Date   MICROALBUR <0.7 07/22/2019

## 2021-02-01 NOTE — Assessment & Plan Note (Signed)
Currently asymptomatic at rest.  continue supplemental oxygen, Trelegy

## 2021-02-02 ENCOUNTER — Ambulatory Visit: Payer: Medicare HMO | Admitting: *Deleted

## 2021-02-03 ENCOUNTER — Telehealth: Payer: Medicare HMO

## 2021-02-03 ENCOUNTER — Telehealth: Payer: Self-pay | Admitting: Pharmacist

## 2021-02-03 NOTE — Telephone Encounter (Signed)
  Chronic Care Management   Note  02/03/2021 Name: Victor Daniels MRN: [id number] DOB: 01-Mar-1946   Attempted to contact patient for scheduled appointment for medication management support. Left HIPAA compliant message for patient to return my call at their convenience.    Plan: - If I do not hear back from the patient by end of business today, will collaborate with Care Guide to outreach to schedule follow up with me   Catie Feliz Beam, PharmD, [place], CPP Clinical Pharmacist [employer] [employer] [phone]

## 2021-02-04 NOTE — Telephone Encounter (Signed)
Patient has been rescheduled.

## 2021-02-09 ENCOUNTER — Other Ambulatory Visit: Payer: Medicare HMO

## 2021-02-10 ENCOUNTER — Telehealth: Payer: Self-pay | Admitting: Pharmacist

## 2021-02-10 NOTE — Telephone Encounter (Signed)
Patient called me to get a review of upcoming appointments. He is noting increased forgetfulness and confusion. Generally remembering to take medications, but often forgetting. Notes he had planned to discuss this with Dr. Darrick Huntsman at his last appointment but forgot.   Patient is coming in for labs tomorrow, can we please help schedule him for an appointment with Dr. Darrick Huntsman for memory? And can we then also print his upcoming appointments?

## 2021-02-11 ENCOUNTER — Other Ambulatory Visit: Payer: Self-pay

## 2021-02-11 ENCOUNTER — Other Ambulatory Visit (INDEPENDENT_AMBULATORY_CARE_PROVIDER_SITE_OTHER): Payer: Medicare HMO

## 2021-02-11 DIAGNOSIS — Z125 Encounter for screening for malignant neoplasm of prostate: Secondary | ICD-10-CM | POA: Diagnosis not present

## 2021-02-11 DIAGNOSIS — E1159 Type 2 diabetes mellitus with other circulatory complications: Secondary | ICD-10-CM | POA: Diagnosis not present

## 2021-02-11 DIAGNOSIS — N1831 Chronic kidney disease, stage 3a: Secondary | ICD-10-CM

## 2021-02-11 NOTE — Addendum Note (Signed)
Addended by: Warden Fillers on: 02/11/2021 02:28 PM   Modules accepted: Orders

## 2021-02-11 NOTE — Addendum Note (Signed)
Addended by: Warden Fillers on: 02/11/2021 02:29 PM   Modules accepted: Orders

## 2021-02-11 NOTE — Addendum Note (Signed)
Addended by: Andera Cranmer S on: 02/11/2021 02:28 PM   Modules accepted: Orders  

## 2021-02-11 NOTE — Addendum Note (Signed)
Addended by: Soila Printup S on: 02/11/2021 02:28 PM   Modules accepted: Orders  

## 2021-02-12 LAB — PSA: PSA: 0.29 ng/mL (ref <=4.0)

## 2021-02-12 LAB — MICROALBUMIN / CREATININE URINE RATIO
Creatinine, Urine: 137 mg/dL (ref 20–320)
Microalb Creat Ratio: 4 mcg/mg creat (ref <30)
Microalb, Ur: 0.5 mg/dL

## 2021-02-12 LAB — COMPREHENSIVE METABOLIC PANEL
AG Ratio: 0.8 (calc) — ABNORMAL LOW (ref 1.0–2.5)
ALT: 20 U/L (ref 9–46)
AST: 19 U/L (ref 10–35)
Albumin: 3 g/dL — ABNORMAL LOW (ref 3.6–5.1)
Alkaline phosphatase (APISO): 129 U/L (ref 35–144)
BUN: 21 mg/dL (ref 7–25)
CO2: 28 mmol/L (ref 20–32)
Calcium: 9 mg/dL (ref 8.6–10.3)
Chloride: 100 mmol/L (ref 98–110)
Creat: 1.18 mg/dL (ref 0.70–1.18)
Globulin: 3.8 g/dL (calc) — ABNORMAL HIGH (ref 1.9–3.7)
Glucose, Bld: 126 mg/dL — ABNORMAL HIGH (ref 65–99)
Potassium: 4.5 mmol/L (ref 3.5–5.3)
Sodium: 137 mmol/L (ref 135–146)
Total Bilirubin: 0.9 mg/dL (ref 0.2–1.2)
Total Protein: 6.8 g/dL (ref 6.1–8.1)

## 2021-02-12 LAB — HEMOGLOBIN A1C
Hgb A1c MFr Bld: 6.1 % of total Hgb — ABNORMAL HIGH (ref <5.7)
Mean Plasma Glucose: 128 mg/dL
eAG (mmol/L): 7.1 mmol/L

## 2021-02-12 LAB — LIPID PANEL
Cholesterol: 103 mg/dL (ref <200)
HDL: 37 mg/dL — ABNORMAL LOW (ref >=40)
LDL Cholesterol (Calc): 53 mg/dL (calc)
Non-HDL Cholesterol (Calc): 66 mg/dL (calc) (ref <130)
Total CHOL/HDL Ratio: 2.8 (calc) (ref <5.0)
Triglycerides: 54 mg/dL (ref <150)

## 2021-02-14 ENCOUNTER — Telehealth: Payer: Self-pay | Admitting: Internal Medicine

## 2021-02-14 NOTE — Telephone Encounter (Signed)
Spoke with pt to let him know that there is rxs at the pharmacy for Feb, March and April. Explained to him that he just needs to call pharmacy and let them know he needs a refill.

## 2021-02-14 NOTE — Telephone Encounter (Signed)
Patient called in for refill for oxyCODONE (ROXICODONE) 15 MG immediate release tablet

## 2021-02-17 ENCOUNTER — Other Ambulatory Visit: Payer: Self-pay

## 2021-02-17 NOTE — Patient Outreach (Signed)
[facility]) Care Management  02/17/2021  MARRIS FRONTERA [birth year] [id number]   Patient to be followed by embedded practice for CCM.    Plan: RN CM will close case at this time.  Bary Leriche, RN, MSN [facility] Care Management Care Management Coordinator Direct Line [phone] Cell [phone] Toll Free: [phone]  Fax: [phone]

## 2021-02-18 ENCOUNTER — Telehealth: Payer: Self-pay | Admitting: *Deleted

## 2021-02-18 NOTE — Chronic Care Management (AMB) (Signed)
  Care Management   Note  02/18/2021 Name: Victor Daniels MRN: [id number] DOB: October 01, 1946  Victor Daniels is a 75 y.o. year old male who is a primary care patient of Darrick Huntsman, Mar Daring, MD and is actively engaged with the care management team. I reached out to [name] by phone today to assist with scheduling an initial visit with the RN Case Manager  Follow up plan: Telephone appointment with care management team member scheduled for:03/11/2021   [facility] Guide, Embedded Care Coordination [facility]  Care Management

## 2021-02-18 NOTE — Chronic Care Management (AMB) (Signed)
  Care Management   Note  02/18/2021 Name: Victor Daniels MRN: [id number] DOB: 06-28-1946  Victor Daniels is a 75 y.o. year old male who is a primary care patient of Darrick Huntsman, Mar Daring, MD and is actively engaged with the care management team. I reached out to [name] by phone today to assist with scheduling an initial visit with the RN Case Manager  Follow up plan: Unsuccessful telephone outreach attempt made. A HIPAA compliant phone message was left for the patient providing contact information and requesting a return call.  The care management team will reach out to the patient again over the next 5 days.  If patient returns call to provider office, please advise to call Embedded Care Management Care Guide Avie Arenas at [phone]  [facility] Guide, Embedded Care Coordination [facility]  Care Management

## 2021-02-18 NOTE — Progress Notes (Signed)
Opened in error

## 2021-02-23 ENCOUNTER — Ambulatory Visit: Payer: Medicare HMO | Admitting: Cardiovascular Disease

## 2021-02-25 ENCOUNTER — Telehealth: Payer: Self-pay

## 2021-02-25 DIAGNOSIS — I5030 Unspecified diastolic (congestive) heart failure: Secondary | ICD-10-CM

## 2021-02-25 MED ORDER — SPIRONOLACTONE 25 MG PO TABS: 25.0000 mg | ORAL_TABLET | Freq: Every day | ORAL | 0 refills | Status: DC

## 2021-02-25 NOTE — Telephone Encounter (Signed)
Pt needs about 10 days worth of spironolactone (ALDACTONE) 25 MG tablet sent to [facility] on [address] until [facility] can get the rx to him.

## 2021-02-28 ENCOUNTER — Ambulatory Visit: Payer: Medicare HMO | Admitting: Internal Medicine

## 2021-03-04 ENCOUNTER — Other Ambulatory Visit: Payer: Self-pay

## 2021-03-04 DIAGNOSIS — I5030 Unspecified diastolic (congestive) heart failure: Secondary | ICD-10-CM

## 2021-03-04 DIAGNOSIS — E782 Mixed hyperlipidemia: Secondary | ICD-10-CM

## 2021-03-04 MED ORDER — SPIRONOLACTONE 25 MG PO TABS: 25.0000 mg | ORAL_TABLET | Freq: Every day | ORAL | 0 refills | Status: DC

## 2021-03-04 MED ORDER — LOSARTAN POTASSIUM 25 MG PO TABS: 25.0000 mg | ORAL_TABLET | Freq: Every day | ORAL | 1 refills | Status: DC

## 2021-03-04 MED ORDER — SIMVASTATIN 20 MG PO TABS: 20.0000 mg | ORAL_TABLET | Freq: Every day | ORAL | 1 refills | Status: DC

## 2021-03-08 ENCOUNTER — Telehealth: Payer: Self-pay | Admitting: Internal Medicine

## 2021-03-08 NOTE — Telephone Encounter (Signed)
Pt called he is wanting to have home health come out to start helping him with daily activities

## 2021-03-09 ENCOUNTER — Ambulatory Visit (INDEPENDENT_AMBULATORY_CARE_PROVIDER_SITE_OTHER): Payer: Medicare HMO

## 2021-03-09 VITALS — Ht 72.01 in | Wt 268.0 lb

## 2021-03-09 DIAGNOSIS — Z0000 Encounter for general adult medical examination without abnormal findings: Secondary | ICD-10-CM | POA: Diagnosis not present
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
## 2021-03-09 NOTE — Patient Instructions (Addendum)
Mr. Victor Daniels , Thank you for taking time to come for your Medicare Wellness Visit. I appreciate your ongoing commitment to your health goals. Please review the following plan we discussed and let me know if I can assist you in the future.   These are the goals we discussed: Goals      Patient Stated   .  DIET - REDUCE SUGAR INTAKE (pt-stated)      Other   .  Manage My Medicine      Follow Up Date 02/21/21 Timeframe:  Long-Range Goal Priority:  High Start Date:  10/14/20                           Expected End Date:   02/21/21                      - call for medicine refill 2 or 3 days before it runs out - call if I am sick and can't take my medicine - keep a list of all the medicines I take; vitamins and herbals too    Why is this important?   These steps will help you keep on track with your medicines.    Notes:     .  Track and Manage Fluids and Swelling      Follow Up Date 02/21/21 Timeframe:  Long-Range Goal Priority:  Medium Start Date:   10/14/20                          Expected End Date:                         - call office if I gain more than 2 pounds in one day or 5 pounds in one week - do ankle pumps when sitting - keep legs up while sitting - track weight in diary - use salt in moderation - watch for swelling in feet, ankles and legs every day - weigh myself daily    Why is this important?   It is important to check your weight daily and watch how much salt and liquids you have.  It will help you to manage your heart failure.    Notes:     .  Track and Manage My Symptoms      Follow Up Date 02/21/21 Timeframe:  Long-Range Goal Priority:  Medium Start Date:  10/14/20                           Expected End Date: 02/21/21                        - follow rescue plan if symptoms flare-up - keep follow-up appointments    Why is this important?   Tracking your symptoms and other information about your health helps your doctor plan your care.  Write  down the symptoms, the time of day, what you were doing and what medicine you are taking.  You will soon learn how to manage your symptoms.     Notes:     .  Track and Manage Symptoms      Follow Up Date 02/21/21 Timeframe:  Long-Range Goal Priority:  Medium Start Date:   10/14/20  Expected End Date:  02/21/21                       - bring diary to all appointments - eat more whole grains, fruits and vegetables, lean meats and healthy fats - follow rescue plan if symptoms flare-up - know when to call the doctor - dress right for the weather, hot or cold    Why is this important?   You will be able to handle your symptoms better if you keep track of them.  Making some simple changes to your lifestyle will help.  Eating healthy is one thing you can do to take good care of yourself.    Notes:        This is a list of the screening recommended for you and due dates:  Health Maintenance  Topic Date Due  . Cologuard (Stool DNA test)  03/21/2019  . Eye exam for diabetics  09/08/2020  . Flu Shot  03/24/2021*  . COVID-19 Vaccine (3 - Booster for Pfizer series) 05/02/2021  . Hemoglobin A1C  08/11/2021  . Complete foot exam   10/11/2021  . Tetanus Vaccine  05/25/2025  .  Hepatitis C: One time screening is recommended by Center for Disease Control  (CDC) for  adults born from [birth year] through 1965.   Completed  . Pneumonia vaccines  Completed  . HPV Vaccine  Aged Out  *Topic was postponed. The date shown is not the original due date.    Immunizations Immunization History  Administered Date(s) Administered  . Influenza Split 10/09/2011, 12/03/2012  . Influenza, High Dose Seasonal PF 09/01/2016, 09/12/2017, 12/20/2018  . Influenza,inj,Quad PF,6+ Mos 09/10/2013, 09/19/2014  . PFIZER(Purple Top)SARS-COV-2 Vaccination 10/01/2020, 11/02/2020  . Pneumococcal Conjugate-13 02/23/2016  . Pneumococcal Polysaccharide-23 02/21/2010, 12/20/2018  . Tdap 05/25/2010   Keep  all routine maintenance appointments.   CCM CARE 03/11/21 @ 1:15  CCM Pharmacy 04/12/21 @ 1:00  05/03/21 [facility] Pharmacy @ 1:00  Follow up 05/03/21 @ 1:00  Advanced directives: not completed at this time.   Conditions/risks identified: none new.   Follow up in one year for your annual wellness visit.   Preventive Care 8065 Years and Older, Male Preventive care refers to lifestyle choices and visits with your health care provider that can promote health and wellness. What does preventive care include?  A yearly physical exam. This is also called an annual well check.  Dental exams once or twice a year.  Routine eye exams. Ask your health care provider how often you should have your eyes checked.  Personal lifestyle choices, including:  Daily care of your teeth and gums.  Regular physical activity.  Eating a healthy diet.  Avoiding tobacco and drug use.  Limiting alcohol use.  Practicing safe sex.  Taking low doses of aspirin every day.  Taking vitamin and mineral supplements as recommended by your health care provider. What happens during an annual well check? The services and screenings done by your health care provider during your annual well check will depend on your age, overall health, lifestyle risk factors, and family history of disease. Counseling  Your health care provider may ask you questions about your:  Alcohol use.  Tobacco use.  Drug use.  Emotional well-being.  Home and relationship well-being.  Sexual activity.  Eating habits.  History of falls.  Memory and ability to understand (cognition).  Work and work Astronomerenvironment. Screening  You may have the following tests or measurements:  Height, weight, and  BMI.  Blood pressure.  Lipid and cholesterol levels. These may be checked every 5 years, or more frequently if you are over 83 years old.  Skin check.  Lung cancer screening. You may have this screening every year starting at age 65 if  you have a 30-pack-year history of smoking and currently smoke or have quit within the past 15 years.  Fecal occult blood test (FOBT) of the stool. You may have this test every year starting at age 56.  Flexible sigmoidoscopy or colonoscopy. You may have a sigmoidoscopy every 5 years or a colonoscopy every 10 years starting at age 26.  Prostate cancer screening. Recommendations will vary depending on your family history and other risks.  Hepatitis C blood test.  Hepatitis B blood test.  Sexually transmitted disease (STD) testing.  Diabetes screening. This is done by checking your blood sugar (glucose) after you have not eaten for a while (fasting). You may have this done every 1-3 years.  Abdominal aortic aneurysm (AAA) screening. You may need this if you are a current or former smoker.  Osteoporosis. You may be screened starting at age 19 if you are at high risk. Talk with your health care provider about your test results, treatment options, and if necessary, the need for more tests. Vaccines  Your health care provider may recommend certain vaccines, such as:  Influenza vaccine. This is recommended every year.  Tetanus, diphtheria, and acellular pertussis (Tdap, Td) vaccine. You may need a Td booster every 10 years.  Zoster vaccine. You may need this after age 99.  Pneumococcal 13-valent conjugate (PCV13) vaccine. One dose is recommended after age 58.  Pneumococcal polysaccharide (PPSV23) vaccine. One dose is recommended after age 2. Talk to your health care provider about which screenings and vaccines you need and how often you need them. This information is not intended to replace advice given to you by your health care provider. Make sure you discuss any questions you have with your health care provider. Document Released: 01/07/2016 Document Revised: 08/30/2016 Document Reviewed: 10/12/2015 Elsevier Interactive Patient Education  2017 [employer].  Fall Prevention in the  Home Falls can cause injuries. They can happen to people of all ages. There are many things you can do to make your home safe and to help prevent falls. What can I do on the outside of my home?  Regularly fix the edges of walkways and driveways and fix any cracks.  Remove anything that might make you trip as you walk through a door, such as a raised step or threshold.  Trim any bushes or trees on the path to your home.  Use bright outdoor lighting.  Clear any walking paths of anything that might make someone trip, such as rocks or tools.  Regularly check to see if handrails are loose or broken. Make sure that both sides of any steps have handrails.  Any raised decks and porches should have guardrails on the edges.  Have any leaves, snow, or ice cleared regularly.  Use sand or salt on walking paths during winter.  Clean up any spills in your garage right away. This includes oil or grease spills. What can I do in the bathroom?  Use night lights.  Install grab bars by the toilet and in the tub and shower. Do not use towel bars as grab bars.  Use non-skid mats or decals in the tub or shower.  If you need to sit down in the shower, use a plastic, non-slip stool.  Keep the floor dry. Clean up any water that spills on the floor as soon as it happens.  Remove soap buildup in the tub or shower regularly.  Attach bath mats securely with double-sided non-slip rug tape.  Do not have throw rugs and other things on the floor that can make you trip. What can I do in the bedroom?  Use night lights.  Make sure that you have a light by your bed that is easy to reach.  Do not use any sheets or blankets that are too big for your bed. They should not hang down onto the floor.  Have a firm chair that has side arms. You can use this for support while you get dressed.  Do not have throw rugs and other things on the floor that can make you trip. What can I do in the kitchen?  Clean up any  spills right away.  Avoid walking on wet floors.  Keep items that you use a lot in easy-to-reach places.  If you need to reach something above you, use a strong step stool that has a grab bar.  Keep electrical cords out of the way.  Do not use floor polish or wax that makes floors slippery. If you must use wax, use non-skid floor wax.  Do not have throw rugs and other things on the floor that can make you trip. What can I do with my stairs?  Do not leave any items on the stairs.  Make sure that there are handrails on both sides of the stairs and use them. Fix handrails that are broken or loose. Make sure that handrails are as long as the stairways.  Check any carpeting to make sure that it is firmly attached to the stairs. Fix any carpet that is loose or worn.  Avoid having throw rugs at the top or bottom of the stairs. If you do have throw rugs, attach them to the floor with carpet tape.  Make sure that you have a light switch at the top of the stairs and the bottom of the stairs. If you do not have them, ask someone to add them for you. What else can I do to help prevent falls?  Wear shoes that:  Do not have high heels.  Have rubber bottoms.  Are comfortable and fit you well.  Are closed at the toe. Do not wear sandals.  If you use a stepladder:  Make sure that it is fully opened. Do not climb a closed stepladder.  Make sure that both sides of the stepladder are locked into place.  Ask someone to hold it for you, if possible.  Clearly mark and make sure that you can see:  Any grab bars or handrails.  First and last steps.  Where the edge of each step is.  Use tools that help you move around (mobility aids) if they are needed. These include:  Canes.  Walkers.  Scooters.  Crutches.  Turn on the lights when you go into a dark area. Replace any light bulbs as soon as they burn out.  Set up your furniture so you have a clear path. Avoid moving your  furniture around.  If any of your floors are uneven, fix them.  If there are any pets around you, be aware of where they are.  Review your medicines with your doctor. Some medicines can make you feel dizzy. This can increase your chance of falling. Ask your doctor what other things that you can  do to help prevent falls. This information is not intended to replace advice given to you by your health care provider. Make sure you discuss any questions you have with your health care provider. Document Released: 10/07/2009 Document Revised: 05/18/2016 Document Reviewed: 01/15/2015 Elsevier Interactive Patient Education  2017 [employer].

## 2021-03-09 NOTE — Telephone Encounter (Signed)
Doesn't this require a face to face visit?

## 2021-03-09 NOTE — Telephone Encounter (Signed)
Yes it does.

## 2021-03-10 NOTE — Telephone Encounter (Signed)
Called pt to see about scheduling a face to face appt so pt could get some home health services. Pt stated "that has changed". I clarified that he meant he has changed his mind and doesn't need home health anymore. Pt stated "my wife does not want any body coming in our home, we can take care of it our selves". Pt did not schedule an appt at this time.

## 2021-03-11 ENCOUNTER — Telehealth: Payer: Self-pay | Admitting: *Deleted

## 2021-03-11 ENCOUNTER — Telehealth: Payer: Medicare HMO

## 2021-03-11 NOTE — Telephone Encounter (Signed)
  Chronic Care Management   Outreach Note  03/11/2021 Name: Victor Daniels MRN: [id number] DOB: 1946/06/06  Referred by: [name], MD Reason for referral : Chronic Care Management (ChF, COPD)   An unsuccessful telephone outreach was attempted today. The patient was referred to the case management team for assistance with care management and care coordination.   Follow Up Plan: RNCM will seek assistance from Care Guide to help reschedule appointment within the next 30 business days.  Rhae Lerner RN, MSN RN Care Management Coordinator [facility] [phone] [email or web address]

## 2021-03-15 ENCOUNTER — Telehealth: Payer: Self-pay

## 2021-03-15 NOTE — Chronic Care Management (AMB) (Signed)
  Care Management   Note  03/15/2021 Name: Victor Daniels MRN: [id number] DOB: [birth year]  Victor Daniels is a 75 y.o. year old male who is a primary care patient of Darrick Huntsman, Mar Daring, MD and is actively engaged with the care management team. I reached out to Victor Daniels by phone today to assist with re-scheduling a follow up visit with the RN Case Manager  Follow up plan: Unsuccessful telephone outreach attempt made. A HIPAA compliant phone message was left for the patient providing contact information and requesting a return call.  The care management team will reach out to the patient again over the next 7 days.  If patient returns call to provider office, please advise to call Embedded Care Management Care Guide Victor Daniels  at [phone]  Victor Daniels, RMA Care Guide, Embedded Care Coordination [facility]  [place], [place] [address] Direct Dial: [phone] [email or web address] Website: [email or web address]

## 2021-03-16 ENCOUNTER — Other Ambulatory Visit: Payer: Self-pay | Admitting: Internal Medicine

## 2021-03-16 ENCOUNTER — Other Ambulatory Visit: Payer: Self-pay | Admitting: Urology

## 2021-03-16 ENCOUNTER — Telehealth: Payer: Self-pay | Admitting: Internal Medicine

## 2021-03-16 NOTE — Telephone Encounter (Signed)
Victor Daniels will call him back

## 2021-03-18 NOTE — Chronic Care Management (AMB) (Signed)
  Care Management   Note  03/18/2021 Name: [name] MRN: [id number] DOB: July 07, 1946  Victor Daniels is a 75 y.o. year old male who is a primary care patient of Darrick Huntsman, Mar Daring, MD and is actively engaged with the care management team. I reached out to [name] by phone today to assist with re-scheduling an initial visit with the RN Case Manager  Follow up plan: Telephone appointment with care management team member scheduled for:03/21/2021  Penne Lash, RMA Care Guide, Embedded Care Coordination [facility]  [place], [place] [address] Direct Dial: [phone] [email or web address] Website: [email or web address]

## 2021-03-18 NOTE — Telephone Encounter (Signed)
Patient has been rescheduled.

## 2021-03-21 ENCOUNTER — Telehealth: Payer: Self-pay | Admitting: Pharmacist

## 2021-03-21 ENCOUNTER — Telehealth: Payer: Medicare HMO | Admitting: Internal Medicine

## 2021-03-21 ENCOUNTER — Telehealth: Payer: Medicare HMO

## 2021-03-21 ENCOUNTER — Telehealth: Payer: Self-pay | Admitting: *Deleted

## 2021-03-21 NOTE — Telephone Encounter (Signed)
Received voicemail from patient on Sunday afternoon noting that he is "too weak" to care for himself and needs home health ordered.   Routing to PCP for FYI prior to their in office appointment today and RN CM for phone call appointment today.

## 2021-03-21 NOTE — Telephone Encounter (Signed)
  Chronic Care Management   Outreach Note  03/21/2021 Name: Victor Daniels MRN: [id number] DOB: 1946-02-07  Referred by: [name], MD Reason for referral : Chronic Care Management (CHF, COPD)   A second unsuccessful telephone outreach was attempted today. The patient was referred to the case management team for assistance with care management and care coordination.   Follow Up Plan: RNCM will seek assistance from Care Guides to reschedule initial RN CCM appointment as soon as possible.  Rhae Lerner RN, MSN RN Care Management Coordinator [facility] [phone] [email or web address]

## 2021-03-23 ENCOUNTER — Encounter: Payer: Self-pay | Admitting: Internal Medicine

## 2021-03-23 ENCOUNTER — Telehealth (INDEPENDENT_AMBULATORY_CARE_PROVIDER_SITE_OTHER): Payer: Medicare HMO | Admitting: Internal Medicine

## 2021-03-23 ENCOUNTER — Encounter: Payer: Self-pay | Admitting: *Deleted

## 2021-03-23 VITALS — Ht 72.0 in | Wt 250.0 lb

## 2021-03-23 DIAGNOSIS — R627 Adult failure to thrive: Secondary | ICD-10-CM
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
## 2021-03-23 NOTE — Assessment & Plan Note (Addendum)
Patient has advanced COPD,  On 02,  Complicated by CHF and untreated OSA ,  Morbid obesity and has become dependent on his wife to maintain independent living status.  His wife has been recently diagnosed with stage 4 bladder CA and will be starting chemotherapy soon.  She would prefer that he be relocated to an A/L facility but he refuses ,  And requests home health services. He is able to shower , dress and take his medications.  He cannot prepare meals except if they are microwaveable.  His medications are delivered to home.  He uses a walker and portable oxygen. CCM referral made to facilitate a face to face visit so Home health evaluation can be done.

## 2021-03-24 ENCOUNTER — Ambulatory Visit: Payer: Medicare HMO | Admitting: Urology

## 2021-03-24 ENCOUNTER — Telehealth: Payer: Self-pay | Admitting: *Deleted

## 2021-03-24 ENCOUNTER — Encounter: Payer: Self-pay | Admitting: Internal Medicine

## 2021-03-24 ENCOUNTER — Ambulatory Visit (INDEPENDENT_AMBULATORY_CARE_PROVIDER_SITE_OTHER): Payer: Medicare HMO | Admitting: Internal Medicine

## 2021-03-24 ENCOUNTER — Other Ambulatory Visit: Payer: Self-pay

## 2021-03-24 VITALS — BP 124/66 | HR 97 | Temp 97.4°F | Resp 15 | Ht 72.0 in | Wt 234.4 lb

## 2021-03-24 DIAGNOSIS — J431 Panlobular emphysema: Secondary | ICD-10-CM

## 2021-03-24 DIAGNOSIS — R627 Adult failure to thrive: Secondary | ICD-10-CM | POA: Diagnosis not present

## 2021-03-24 DIAGNOSIS — I5030 Unspecified diastolic (congestive) heart failure: Secondary | ICD-10-CM

## 2021-03-24 DIAGNOSIS — F5102 Adjustment insomnia: Secondary | ICD-10-CM

## 2021-03-24 DIAGNOSIS — J449 Chronic obstructive pulmonary disease, unspecified: Secondary | ICD-10-CM | POA: Diagnosis not present

## 2021-03-24 MED ORDER — TRAZODONE HCL 50 MG PO TABS: 50.0000 mg | ORAL_TABLET | Freq: Every day | ORAL | 0 refills | Status: DC

## 2021-03-24 NOTE — Telephone Encounter (Cosign Needed)
  Chronic Care Management   Outreach Note  03/24/2021 Name: Victor Daniels MRN: [id number] DOB: 03-18-1946  Referred by: [name], MD Reason for referral : Chronic Care Management (CHF, CKD, Failure to Thrive Syndrome, Inability to Consistently Perform ADL's Independently.) LCSW received a new referral on patient to assist with long-term care placement arrangements into a skilled nursing facility.  Patient's wife was his primary caregiver, prior to her being diagnosed with Stage IV Cancer.  Patient's wife is no longer able to provide 24 hour care and supervision to patient, as she is currently preparing to undergo aggressive chemotherapy and radiation therapy treatments.  Patient's wife has been trying to encourage patient to go into a skilled nursing facility, so that he can continue to receive proper care, supervision and assistance with activities of daily living, but patient adamantly refuses.  Patient was; however, agreeable to receiving home health services.   Based on patient's scheduled telephone conversation with his Primary Care Physician, Dr. Duncan Dull yesterday (03/23/2021), it appears that he continues to refuse placement, of any kind, not even willing to consider long-term care placement into an assisted living facility.  Dr. Darrick Huntsman reported that patient will need transportation arranged to her office as soon as possible, so that she is able to perform a face-to-face visit to write orders for home health services.  An emergent referral was placed to the embedded care guide to assist patient with arranging transportation to Dr. Melina Schools office, through [facility]. Patient was able to attend a scheduled visit with Dr. Darrick Huntsman today (03/24/2021), and orders were obtained for home health services.  The home health nurse will perform the initial home health safety evaluation, then make additional home health provider referrals, based on her findings and  patient's level of care needs.  If it is determined that patient is not safe to live in his home in his current medical condition, a referral will be placed to [facility] [address] [facility] [place].  LCSW recommended that patient also receive a home health aide to assist with bathing, dressing, light housekeeping duties, meal preparation, etc.  LCSW has also requested that the embedded care guide provide patient with resources for in-home care services, as well as food assistance and prepared meal delivery services.   Follow Up Plan: No follow-up required, unless additional social work needs are identified.  [name] LCSW Licensed Clinical Social Worker [facility]  [phone]

## 2021-03-24 NOTE — Patient Instructions (Signed)
  I am prescribing trazodone to help you rest at night.  Start at 1/2 tablet about 30 minutes before bedtime.  You can take a whole tablet if needed    I will initiate the Home Health referral.  This will result in a nurse coming out to your home to evaluate your needs and provide the services that are covered by your insurance  The Chronic Care management team can provide a social worker to help provide transportation

## 2021-03-24 NOTE — Progress Notes (Signed)
Subjective:  Patient ID: Victor Daniels, male    DOB: 01/01/1946  Age: 75 y.o. MRN: [id number]  CC: The primary encounter diagnosis was CHF with left ventricular diastolic dysfunction, NYHA class 2 (HCC). Diagnoses of Failure to thrive syndrome, adult, Panlobular emphysema (HCC), and Insomnia due to psychological stress were also pertinent to this visit.  HPI Victor Daniels presents for follow up on recent request for home health aide. Patient is brought in by a friend as he has no transportation otherwise.   This visit occurred during the SARS-CoV-2 public health emergency.  Safety protocols were in place, including screening questions prior to the visit, additional usage of staff PPE, and extensive cleaning of exam room while observing appropriate contact time as indicated for disinfecting solutions.   Victor Daniels is a 75 yr old male with chronic respiratory failure secondary to COPD, CHF with combined systolic/diastolic dysfunction who presents for follow up,  He has not been doing well since his last visit.Marland Kitchen  His wife has been diagnosed with Stage 4 bladder cancer and has been keeping him uninformed about her plans and prognosis    He has had an intentional weight loss that has been achieved with dietary restrictions largely enforced by his wife.  The weight loss since July is 53 lbs .  He is not skipping meals.  Since his wife's health has declined he has  Not been eating as well .  He admits to having  2 nuttiee buddies per day , and microwaved dinners.  Eating jimmy dean sausage egg and cheese biscuits  For breakfast .  Has a dysfunctional relationship with his wife.  No  Children.  Wife will not disclose her health information to him so he feels helpless. He is concerned that the doctors are "doing nothing for her."  .  Cc: insomnia ,  But afraid to take a sedative because he already missed his wife's call one night because his cell phone was on the charger.  Discussed  trazodone  Outpatient Medications Prior to Visit  Medication Sig Dispense Refill  . albuterol (PROVENTIL) (2.5 MG/3ML) 0.083% nebulizer solution Take 3 mLs (2.5 mg total) by nebulization every 6 (six) hours as needed for wheezing or shortness of breath. 75 mL 0  . Fluticasone-Umeclidin-Vilant (TRELEGY ELLIPTA) 100-62.5-25 MCG/INH AEPB Inhale 1 puff into the lungs daily. 3 each 1  . furosemide (LASIX) 40 MG tablet Take 1 tablet (40 mg total) by mouth 2 (two) times daily. (Patient taking differently: Take 40 mg by mouth 2 (two) times daily. Pt states that he takes as needed) 60 tablet 0  . ipratropium (ATROVENT) 0.03 % nasal spray ipratropium bromide 21 mcg (0.03 %) nasal spray    . ipratropium-albuterol (DUONEB) 0.5-2.5 (3) MG/3ML SOLN USE 1 VIAL IN NEBULIZER EVERY 6 HOURS AS NEEDED FOR WHEEZING 360 mL 0  . losartan (COZAAR) 25 MG tablet Take 1 tablet (25 mg total) by mouth daily. 90 tablet 1  . methocarbamol (ROBAXIN) 500 MG tablet Take 500 mg by mouth every 6 (six) hours as needed.    . nitroGLYCERIN (NITROSTAT) 0.4 MG SL tablet Place 1 tablet (0.4 mg total) under the tongue every 5 (five) minutes as needed for chest pain. Maximum dose 3 tablets 50 tablet 3  . oxyCODONE (ROXICODONE) 15 MG immediate release tablet Take 1 tablet (15 mg total) by mouth every 6 (six) hours as needed. 120 tablet 0  . [START ON 04/07/2021] oxyCODONE (ROXICODONE) 15 MG immediate release tablet Take  1 tablet (15 mg total) by mouth every 6 (six) hours as needed for pain. 120 tablet 0  . oxyCODONE (ROXICODONE) 15 MG immediate release tablet Take 1 tablet (15 mg total) by mouth every 6 (six) hours as needed. 120 tablet 0  . OXYGEN Inhale 2 L into the lungs 3 (three) times daily as needed (shortness of breath).     . rivaroxaban (XARELTO) 20 MG TABS tablet Take 20 mg by mouth daily with supper.    . simvastatin (ZOCOR) 20 MG tablet Take 1 tablet (20 mg total) by mouth at bedtime. 90 tablet 1  . spironolactone (ALDACTONE) 25 MG  tablet Take 1 tablet (25 mg total) by mouth daily. 90 tablet 0  . VENTOLIN HFA 108 (90 Base) MCG/ACT inhaler INHALE 2 PUFFS BY MOUTH EVERY 6 HOURS AS NEEDED FOR WHEEZING OR SHORTNESS OF BREATH 18 g 0  . vitamin B-12 (CYANOCOBALAMIN) 1000 MCG tablet Take 1,000 mcg by mouth daily.     No facility-administered medications prior to visit.    Review of Systems;  Patient denies headache, fevers, malaise, unintentional weight loss, skin rash, eye pain, sinus congestion and sinus pain, sore throat, dysphagia,  hemoptysis , cough, dyspnea, wheezing, chest pain, palpitations, orthopnea, edema, abdominal pain, nausea, melena, diarrhea, constipation, flank pain, dysuria, hematuria, urinary  Frequency, nocturia, numbness, tingling, seizures,  Focal weakness, Loss of consciousness,  Tremor, insomnia, depression, anxiety, and suicidal ideation.      Objective:  BP 124/66 (BP Location: Left Arm, Patient Position: Sitting, Cuff Size: Large)   Pulse 97   Temp (!) 97.4 F (36.3 C) (Oral)   Resp 15   Ht 6' (1.829 m)   Wt 234 lb 6.4 oz (106.3 kg)   SpO2 98% Comment: 3L O2  BMI 31.79 kg/m   BP Readings from Last 3 Encounters:  03/24/21 124/66  01/31/21 (!) 130/112  12/10/20 140/80    Wt Readings from Last 3 Encounters:  03/24/21 234 lb 6.4 oz (106.3 kg)  03/23/21 250 lb (113.4 kg)  03/09/21 268 lb (121.6 kg)    General appearance: alert, cooperative and appears stated age Ears: normal TM's and external ear canals both ears Throat: lips, mucosa, and tongue normal; teeth and gums normal Neck: no adenopathy, no carotid bruit, supple, symmetrical, trachea midline and thyroid not enlarged, symmetric, no tenderness/mass/nodules Back: symmetric, no curvature. ROM normal. No CVA tenderness. Lungs: clear to auscultation bilaterally Heart: regular rate and rhythm, S1, S2 normal, no murmur, click, rub or gallop Abdomen: soft, non-tender; bowel sounds normal; no masses,  no organomegaly Pulses: 2+ and  symmetric Skin: Skin color, texture, turgor normal. No rashes or lesions Lymph nodes: Cervical, supraclavicular, and axillary nodes normal.  Lab Results  Component Value Date   HGBA1C 6.1 (H) 02/11/2021   HGBA1C 6.3 (H) 06/23/2020   HGBA1C 6.6 (H) 03/15/2020    Lab Results  Component Value Date   CREATININE 1.18 02/11/2021   CREATININE 1.33 08/17/2020   CREATININE 1.26 (H) 08/03/2020    Lab Results  Component Value Date   WBC 8.5 08/03/2020   HGB 11.0 (L) 08/03/2020   HCT 36.6 (L) 08/03/2020   PLT 241 08/03/2020   GLUCOSE 126 (H) 02/11/2021   CHOL 103 02/11/2021   TRIG 54 02/11/2021   HDL 37 (L) 02/11/2021   LDLDIRECT 72.0 12/01/2016   LDLCALC 53 02/11/2021   ALT 20 02/11/2021   AST 19 02/11/2021   NA 137 02/11/2021   K 4.5 02/11/2021   CL 100 02/11/2021  CREATININE 1.18 02/11/2021   BUN 21 02/11/2021   CO2 28 02/11/2021   TSH 2.088 06/23/2020   PSA 0.29 02/11/2021   INR 1.3 (H) 07/23/2020   HGBA1C 6.1 (H) 02/11/2021   MICROALBUR 0.5 02/11/2021    DG Chest Portable 1 View  Result Date: 08/03/2020 CLINICAL DATA:  Chest pain and shortness of breath EXAM: PORTABLE CHEST 1 VIEW COMPARISON:  July 23, 2020 FINDINGS: The heart size and mediastinal contours are unchanged with mild cardiomegaly. Again noted are postsurgical changes of a prior right lobectomy. A small right pleural effusion is present. There is prominence of the central pulmonary vasculature. Again noted are advanced bilateral shoulder osteoarthritis. IMPRESSION: Small right pleural effusion and pulmonary vascular congestion. Electronically Signed   By: Jonna Clark M.D.   On: 08/03/2020 03:21    Assessment & Plan:   Problem List Items Addressed This Visit      Unprioritized   Insomnia due to psychological stress    Trial of trazodone.  No benzodiazepines will be prescribed given his untreated OSA      CHF with left ventricular diastolic dysfunction, NYHA class 2 (HCC) - Primary   Relevant Orders    Ambulatory referral to Home Health   COPD (chronic obstructive pulmonary disease) (HCC)   Relevant Orders   Ambulatory referral to Home Health   Failure to thrive syndrome, adult    He has lost considerable weight since July (53 lbs).  Without his wife's assistance and supervision he appears to be failing to thrive.  He cannot cook, grocery shop or maintain his home.  HH referral to evaluate his medial needs at home .  Social work consult via CCM for additional assistance in providing transportation to office visits,  Physicist, medical, pharmacy needs,  etc      Relevant Orders   Ambulatory referral to Home Health      I am having Victor Millers. Daniels "Ed" start on traZODone. I am also having him maintain his nitroGLYCERIN, OXYGEN, vitamin B-12, furosemide, albuterol, ipratropium, methocarbamol, Trelegy Ellipta, rivaroxaban, ipratropium-albuterol, oxyCODONE, oxyCODONE, oxyCODONE, losartan, simvastatin, spironolactone, and Ventolin HFA.  Meds ordered this encounter  Medications  . traZODone (DESYREL) 50 MG tablet    Sig: Take 1 tablet (50 mg total) by mouth at bedtime.    Dispense:  90 tablet    Refill:  0    There are no discontinued medications.  Follow-up: No follow-ups on file.   [name], MD

## 2021-03-25 ENCOUNTER — Telehealth: Payer: Self-pay

## 2021-03-25 NOTE — Telephone Encounter (Signed)
His medications are on file at his pharmacy.  His last refill was feb 23  So he

## 2021-03-25 NOTE — Telephone Encounter (Signed)
Per DPR called patient and advised that he  has scripts on file at pharmacy .

## 2021-03-25 NOTE — Telephone Encounter (Signed)
Pt needs refill on oxyCODONE (ROXICODONE) 15 MG immediate release tablet. He states that he just saw her yesterday but he forgot to ask her to send that in to [facility] on [address]

## 2021-03-25 NOTE — Telephone Encounter (Signed)
Should be able to request the next one

## 2021-03-26 ENCOUNTER — Encounter: Payer: Self-pay | Admitting: Internal Medicine

## 2021-03-26 DIAGNOSIS — F5102 Adjustment insomnia: Secondary | ICD-10-CM | POA: Insufficient documentation

## 2021-03-26 NOTE — Assessment & Plan Note (Signed)
He has lost considerable weight since July (53 lbs).  Without his wife's assistance and supervision he appears to be failing to thrive.  He cannot cook, grocery shop or maintain his home.  HH referral to evaluate his medial needs at home .  Social work consult via CCM for additional assistance in providing transportation to office visits,  Physicist, medical, pharmacy needs,  etc

## 2021-03-26 NOTE — Assessment & Plan Note (Signed)
Trial of trazodone.  No benzodiazepines will be prescribed given his untreated OSA

## 2021-03-29 ENCOUNTER — Telehealth: Payer: Self-pay

## 2021-03-29 NOTE — Telephone Encounter (Signed)
   Telephone encounter was:  Successful.  03/29/2021 Name: Victor Daniels MRN: [id number] DOB: Sep 18, 1946  Victor Daniels is a 75 y.o. year old male who is a primary care patient of Darrick Huntsman, Mar Daring, MD . The community resource team was consulted for assistance with Transportation Needs  and Food Insecurity  Care guide performed the following interventions: Patient provided with information about care guide support team and interviewed to confirm resource needs Obtained verbal consent to place patient referral to [facility] transportation and [employer] transportation and [employer] Sent referral  to [employer].  Sent request to [facility] transportation for 04/06/21 appt. I will follow-up with [facility] transportation to ensure they received the request and have patient scheduled.  Spoke with Victor Daniels at [employer], transportation is set-up for 05/03/21 appt. with Victor Nordmann MD patient will be called 1-2 days prior to  pick-up. He will be picked up 1hour before appointment and will need to call [phone] after appointment. Patient will be mailed paperwork to continue using [employer] transportation, rides are free through end of June requested that patient's rides be covered by grants if available..  Follow Up Plan:  Care guide will follow up with patient by phone over the next 3 days  Cadyn Rodger, AAS Paralegal, [facility] Care Guide . Embedded Care Coordination [facility] Health  Care Management  [address] [place], [place] [address] ??[email or web address]  ?? [phone]   [email or web address]

## 2021-03-29 NOTE — Telephone Encounter (Signed)
Thank you all for your help I getting mr K what he needs!

## 2021-03-30 ENCOUNTER — Telehealth: Payer: Self-pay

## 2021-03-30 DIAGNOSIS — I872 Venous insufficiency (chronic) (peripheral): Secondary | ICD-10-CM | POA: Diagnosis not present

## 2021-03-30 DIAGNOSIS — N1831 Chronic kidney disease, stage 3a: Secondary | ICD-10-CM | POA: Diagnosis not present

## 2021-03-30 DIAGNOSIS — I4891 Unspecified atrial fibrillation: Secondary | ICD-10-CM | POA: Diagnosis not present

## 2021-03-30 DIAGNOSIS — E1151 Type 2 diabetes mellitus with diabetic peripheral angiopathy without gangrene: Secondary | ICD-10-CM | POA: Diagnosis not present

## 2021-03-30 DIAGNOSIS — I5040 Unspecified combined systolic (congestive) and diastolic (congestive) heart failure: Secondary | ICD-10-CM | POA: Diagnosis not present

## 2021-03-30 DIAGNOSIS — I130 Hypertensive heart and chronic kidney disease with heart failure and stage 1 through stage 4 chronic kidney disease, or unspecified chronic kidney disease: Secondary | ICD-10-CM | POA: Diagnosis not present

## 2021-03-30 DIAGNOSIS — I2510 Atherosclerotic heart disease of native coronary artery without angina pectoris: Secondary | ICD-10-CM | POA: Diagnosis not present

## 2021-03-30 DIAGNOSIS — D631 Anemia in chronic kidney disease: Secondary | ICD-10-CM | POA: Diagnosis not present

## 2021-03-30 DIAGNOSIS — J431 Panlobular emphysema: Secondary | ICD-10-CM | POA: Diagnosis not present

## 2021-03-30 NOTE — Telephone Encounter (Signed)
   Telephone encounter was:  Successful.  03/30/2021 Name: Victor Daniels MRN: [id number] DOB: [birth year]  Victor Daniels is a 75 y.o. year old male who is a primary care patient of Tullo, Mar Daring, MD . The community resource team was consulted for assistance with Transportation Needs , Food Insecurity and Spoke with Victor Daniels at [employer] Mr. Frasier request has been received and is being processed. I requested that an email confirmation be sent to me once they have contacted the patient.  I informed Mr. Choi yesterday to expect a call from them. Received confirmation from Victor Daniels at [employer]. Meals will start on 03/30/21 meals will also be delivered to clients wife and covered by a grant, patient has been contacted.                      Care guide performed the following interventions: Follow up call placed to community resources to determine status of patients referral.  Follow Up Plan:  No further follow up planned at this time. The patient has been provided with needed resources.  Victor Daniels, AAS Paralegal, [facility] Care Guide . Embedded Care Coordination [facility]  Care Management  [address] [place], [place] [address] ??[email or web address]  ?? [phone]   [email or web address]

## 2021-03-31 ENCOUNTER — Ambulatory Visit: Payer: Self-pay

## 2021-04-01 ENCOUNTER — Telehealth: Payer: Self-pay | Admitting: Internal Medicine

## 2021-04-01 NOTE — Telephone Encounter (Signed)
   IZIC STFORT DOB: 1946/03/21 MRN: [id number]   RIDER WAIVER AND RELEASE OF LIABILITY  For purposes of improving physical access to our facilities, [facility] is pleased to partner with third parties to provide [employer] patients or other authorized individuals the option of convenient, on-demand ground transportation services (the [employer]") through use of the technology service that enables users to request on-demand ground transportation from independent third-party providers.  By opting to use and accept these [employer], I, the undersigned, hereby agree on behalf of myself, and on behalf of any minor child using the [employer] for whom I am the parent or legal guardian, as follows:  1. Science writer provided to me are provided by independent third-party transportation providers who are not [employer] or employees and who are unaffiliated with [employer]. 2. [facility] is neither a transportation carrier nor a common or public carrier. 3. [facility] has no control over the quality or safety of the transportation that occurs as a result of the [employer]. 4. [facility] cannot guarantee that any third-party transportation provider will complete any arranged transportation service. 5. [facility] makes no representation, warranty, or guarantee regarding the reliability, timeliness, quality, safety, suitability, or availability of any of the Transport Services or that they will be error free. 6. I fully understand that traveling by vehicle involves risks and dangers of serious bodily injury, including permanent disability, paralysis, and death. I agree, on behalf of myself and on behalf of any minor child using the Transport Services for whom I am the parent or legal guardian, that the entire risk arising out of my use of the [employer] remains solely with me, to the maximum extent permitted under applicable law. 7. The  [employer] are provided "as is" and "as available." [facility] disclaims all representations and warranties, express, implied or statutory, not expressly set out in these terms, including the implied warranties of merchantability and fitness for a particular purpose. 8. I hereby waive and release [facility], its agents, employees, officers, directors, representatives, insurers, attorneys, assigns, successors, subsidiaries, and affiliates from any and all past, present, or future claims, demands, liabilities, actions, causes of action, or suits of any kind directly or indirectly arising from acceptance and use of the [employer]. 9. I further waive and release [facility] and its affiliates from all present and future liability and responsibility for any injury or death to persons or damages to property caused by or related to the use of the [employer]. 10. I have read this Waiver and Release of Liability, and I understand the terms used in it and their legal significance. This Waiver is freely and voluntarily given with the understanding that my right (as well as the right of any minor child for whom I am the parent or legal guardian using the [employer]) to legal recourse against [place] in connection with the [employer] is knowingly surrendered in return for use of these services.   I attest that I read the consent document to [name], gave Mr. Quaintance the opportunity to ask questions and answered the questions asked (if any). I affirm that [name] then provided consent for he's participation in this program.     Ephriam Knuckles Vilsaint

## 2021-04-04 DIAGNOSIS — I5040 Unspecified combined systolic (congestive) and diastolic (congestive) heart failure: Secondary | ICD-10-CM | POA: Diagnosis not present

## 2021-04-04 DIAGNOSIS — E1151 Type 2 diabetes mellitus with diabetic peripheral angiopathy without gangrene: Secondary | ICD-10-CM | POA: Diagnosis not present

## 2021-04-04 DIAGNOSIS — N1831 Chronic kidney disease, stage 3a: Secondary | ICD-10-CM | POA: Diagnosis not present

## 2021-04-04 DIAGNOSIS — D631 Anemia in chronic kidney disease: Secondary | ICD-10-CM | POA: Diagnosis not present

## 2021-04-04 DIAGNOSIS — I4891 Unspecified atrial fibrillation: Secondary | ICD-10-CM | POA: Diagnosis not present

## 2021-04-04 DIAGNOSIS — I130 Hypertensive heart and chronic kidney disease with heart failure and stage 1 through stage 4 chronic kidney disease, or unspecified chronic kidney disease: Secondary | ICD-10-CM | POA: Diagnosis not present

## 2021-04-04 DIAGNOSIS — I2510 Atherosclerotic heart disease of native coronary artery without angina pectoris: Secondary | ICD-10-CM | POA: Diagnosis not present

## 2021-04-04 DIAGNOSIS — I872 Venous insufficiency (chronic) (peripheral): Secondary | ICD-10-CM | POA: Diagnosis not present

## 2021-04-04 DIAGNOSIS — J431 Panlobular emphysema: Secondary | ICD-10-CM | POA: Diagnosis not present
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
## 2021-04-06 ENCOUNTER — Ambulatory Visit: Payer: Medicare HMO | Admitting: Cardiovascular Disease

## 2021-04-06 DIAGNOSIS — J431 Panlobular emphysema: Secondary | ICD-10-CM | POA: Diagnosis not present

## 2021-04-06 DIAGNOSIS — Z6831 Body mass index (BMI) 31.0-31.9, adult: Secondary | ICD-10-CM

## 2021-04-06 DIAGNOSIS — M19019 Primary osteoarthritis, unspecified shoulder: Secondary | ICD-10-CM

## 2021-04-06 DIAGNOSIS — Z96649 Presence of unspecified artificial hip joint: Secondary | ICD-10-CM

## 2021-04-06 DIAGNOSIS — E1151 Type 2 diabetes mellitus with diabetic peripheral angiopathy without gangrene: Secondary | ICD-10-CM | POA: Diagnosis not present

## 2021-04-06 DIAGNOSIS — Z9181 History of falling: Secondary | ICD-10-CM

## 2021-04-06 DIAGNOSIS — D509 Iron deficiency anemia, unspecified: Secondary | ICD-10-CM

## 2021-04-06 DIAGNOSIS — D510 Vitamin B12 deficiency anemia due to intrinsic factor deficiency: Secondary | ICD-10-CM

## 2021-04-06 DIAGNOSIS — Z8701 Personal history of pneumonia (recurrent): Secondary | ICD-10-CM

## 2021-04-06 DIAGNOSIS — N1831 Chronic kidney disease, stage 3a: Secondary | ICD-10-CM | POA: Diagnosis not present

## 2021-04-06 DIAGNOSIS — N401 Enlarged prostate with lower urinary tract symptoms: Secondary | ICD-10-CM

## 2021-04-06 DIAGNOSIS — G4733 Obstructive sleep apnea (adult) (pediatric): Secondary | ICD-10-CM

## 2021-04-06 DIAGNOSIS — I4891 Unspecified atrial fibrillation: Secondary | ICD-10-CM | POA: Diagnosis not present

## 2021-04-06 DIAGNOSIS — I5040 Unspecified combined systolic (congestive) and diastolic (congestive) heart failure: Secondary | ICD-10-CM | POA: Diagnosis not present

## 2021-04-06 DIAGNOSIS — M5010 Cervical disc disorder with radiculopathy, unspecified cervical region: Secondary | ICD-10-CM

## 2021-04-06 DIAGNOSIS — Z86718 Personal history of other venous thrombosis and embolism: Secondary | ICD-10-CM

## 2021-04-06 DIAGNOSIS — D631 Anemia in chronic kidney disease: Secondary | ICD-10-CM | POA: Diagnosis not present

## 2021-04-06 DIAGNOSIS — D692 Other nonthrombocytopenic purpura: Secondary | ICD-10-CM

## 2021-04-06 DIAGNOSIS — I2510 Atherosclerotic heart disease of native coronary artery without angina pectoris: Secondary | ICD-10-CM | POA: Diagnosis not present

## 2021-04-06 DIAGNOSIS — E291 Testicular hypofunction: Secondary | ICD-10-CM

## 2021-04-06 DIAGNOSIS — E785 Hyperlipidemia, unspecified: Secondary | ICD-10-CM

## 2021-04-06 DIAGNOSIS — Z87891 Personal history of nicotine dependence: Secondary | ICD-10-CM

## 2021-04-06 DIAGNOSIS — E669 Obesity, unspecified: Secondary | ICD-10-CM

## 2021-04-06 DIAGNOSIS — Z7901 Long term (current) use of anticoagulants: Secondary | ICD-10-CM

## 2021-04-06 DIAGNOSIS — I130 Hypertensive heart and chronic kidney disease with heart failure and stage 1 through stage 4 chronic kidney disease, or unspecified chronic kidney disease: Secondary | ICD-10-CM | POA: Diagnosis not present

## 2021-04-06 DIAGNOSIS — M5430 Sciatica, unspecified side: Secondary | ICD-10-CM

## 2021-04-06 DIAGNOSIS — I872 Venous insufficiency (chronic) (peripheral): Secondary | ICD-10-CM | POA: Diagnosis not present

## 2021-04-06 DIAGNOSIS — F5105 Insomnia due to other mental disorder: Secondary | ICD-10-CM

## 2021-04-08 DIAGNOSIS — I4891 Unspecified atrial fibrillation: Secondary | ICD-10-CM | POA: Diagnosis not present

## 2021-04-08 DIAGNOSIS — I2510 Atherosclerotic heart disease of native coronary artery without angina pectoris: Secondary | ICD-10-CM | POA: Diagnosis not present

## 2021-04-08 DIAGNOSIS — I5040 Unspecified combined systolic (congestive) and diastolic (congestive) heart failure: Secondary | ICD-10-CM | POA: Diagnosis not present

## 2021-04-08 DIAGNOSIS — E1151 Type 2 diabetes mellitus with diabetic peripheral angiopathy without gangrene: Secondary | ICD-10-CM | POA: Diagnosis not present

## 2021-04-08 DIAGNOSIS — I130 Hypertensive heart and chronic kidney disease with heart failure and stage 1 through stage 4 chronic kidney disease, or unspecified chronic kidney disease: Secondary | ICD-10-CM | POA: Diagnosis not present

## 2021-04-08 DIAGNOSIS — N1831 Chronic kidney disease, stage 3a: Secondary | ICD-10-CM | POA: Diagnosis not present

## 2021-04-08 DIAGNOSIS — J431 Panlobular emphysema: Secondary | ICD-10-CM | POA: Diagnosis not present

## 2021-04-08 DIAGNOSIS — D631 Anemia in chronic kidney disease: Secondary | ICD-10-CM | POA: Diagnosis not present

## 2021-04-08 DIAGNOSIS — I872 Venous insufficiency (chronic) (peripheral): Secondary | ICD-10-CM | POA: Diagnosis not present
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
## 2021-04-11 ENCOUNTER — Ambulatory Visit: Payer: Medicare HMO | Admitting: Urology

## 2021-04-11 ENCOUNTER — Encounter: Payer: Self-pay | Admitting: Urology

## 2021-04-12 ENCOUNTER — Telehealth: Payer: Medicare HMO

## 2021-04-12 ENCOUNTER — Telehealth: Payer: Self-pay | Admitting: Pharmacist

## 2021-04-12 NOTE — Telephone Encounter (Signed)
  Chronic Care Management   Note  04/12/2021 Name: Victor Daniels MRN: [id number] DOB: 1946/06/01   Attempted to contact patient for scheduled appointment for medication management support. Left HIPAA compliant message for patient to return my call at their convenience.    Plan: - If I do not hear back from the patient by end of business today, will collaborate with Care Guide to outreach to schedule follow up with me   Catie Feliz Beam, PharmD, [place], CPP Clinical Pharmacist [employer] at [employer] [phone]

## 2021-04-14 ENCOUNTER — Ambulatory Visit (INDEPENDENT_AMBULATORY_CARE_PROVIDER_SITE_OTHER): Payer: Medicare HMO | Admitting: Pharmacist

## 2021-04-14 DIAGNOSIS — I4819 Other persistent atrial fibrillation: Secondary | ICD-10-CM

## 2021-04-14 DIAGNOSIS — I25118 Atherosclerotic heart disease of native coronary artery with other forms of angina pectoris: Secondary | ICD-10-CM | POA: Diagnosis not present

## 2021-04-14 DIAGNOSIS — J431 Panlobular emphysema: Secondary | ICD-10-CM | POA: Diagnosis not present

## 2021-04-14 DIAGNOSIS — N1831 Chronic kidney disease, stage 3a: Secondary | ICD-10-CM

## 2021-04-14 DIAGNOSIS — I5030 Unspecified diastolic (congestive) heart failure: Secondary | ICD-10-CM

## 2021-04-14 DIAGNOSIS — E785 Hyperlipidemia, unspecified: Secondary | ICD-10-CM

## 2021-04-14 NOTE — Telephone Encounter (Signed)
Sorry, you are correct, he called me back today. Disregard need to r/s.   Victor Daniels

## 2021-04-14 NOTE — Patient Instructions (Signed)
Visit Information  PATIENT GOALS: Goals Addressed              This Visit's Progress     Patient Stated   .  PharmD - Medication Monitoring (pt-stated)        Patient Goals/Self-Care Activities . Over the next 90 days, patient will:  - take medications as prescribed weigh daily, and contact provider if weight gain of >3 lbs/day or 5 lbs/week collaborate with provider on medication access solutions       Patient verbalizes understanding of instructions provided today and agrees to view in MyChart.   Plan: Face to Face appointment with care management team member scheduled for: ~ 3 weeks as previously scheduled  Catie Feliz Beam, PharmD, [place], CPP Clinical Pharmacist [employer] at [facility] [phone]

## 2021-04-14 NOTE — Chronic Care Management (AMB) (Signed)
Chronic Care Management Pharmacy Note  04/14/2021 Name:  Victor Daniels MRN:  [id number] DOB:  02-Aug-1946  Subjective: Victor Daniels is an 75 y.o. year old male who is a primary patient of Tullo, Aris Everts, MD.  The CCM team was consulted for assistance with disease management and care coordination needs.    Engaged with patient by telephone for follow up visit in response to provider referral for pharmacy case management and/or care coordination services.   Consent to Services:  The patient was given information about Chronic Care Management services, agreed to services, and gave verbal consent prior to initiation of services.  Please see initial visit note for detailed documentation.   Patient Care Team: Crecencio Mc, MD as PCP - General (Internal Medicine) Minna Merritts, MD as PCP - Cardiology (Cardiology) Crecencio Mc, MD as Referring Physician (Internal Medicine) Algernon Huxley, MD as Surgeon (Surgery) Wilhelmina Mcardle, MD (Inactive) as Consulting Physician (Pulmonary Disease) De Hollingshead, RPH-CPP as Pharmacist (Pharmacist) Leona Singleton, RN as [place] Management  Recent office visits:  PCP 3/31 for evaluation for Home Health PT/nursing  Recent consult visits: None  Hospital visits: None in previous 6 months  Objective:  Lab Results  Component Value Date   CREATININE 1.18 02/11/2021   CREATININE 1.33 08/17/2020   CREATININE 1.26 (H) 08/03/2020    Lab Results  Component Value Date   HGBA1C 6.1 (H) 02/11/2021   Last diabetic Eye exam:  Lab Results  Component Value Date/Time   HMDIABEYEEXA No Retinopathy 08/14/2017 12:00 AM    Last diabetic Foot exam: No results found for: HMDIABFOOTEX      Component Value Date/Time   CHOL 103 02/11/2021 1430   TRIG 54 02/11/2021 1430   HDL 37 (L) 02/11/2021 1430   CHOLHDL 2.8 02/11/2021 1430   VLDL 20.8 03/15/2020 1051   LDLCALC 53 02/11/2021 1430   LDLDIRECT  72.0 12/01/2016 1459    Hepatic Function Latest Ref Rng & Units 02/11/2021 08/03/2020 07/23/2020  Total Protein 6.1 - 8.1 g/dL 6.8 8.1 7.8  Albumin 3.5 - 5.0 g/dL - 3.3(L) 3.3(L)  AST 10 - 35 U/L '19 26 16  ' ALT 9 - 46 U/L '20 30 12  ' Alk Phosphatase 38 - 126 U/L - 88 86  Total Bilirubin 0.2 - 1.2 mg/dL 0.9 1.2 1.5(H)    Lab Results  Component Value Date/Time   TSH 2.088 06/23/2020 05:26 AM   TSH 1.52 03/15/2020 10:51 AM   TSH 0.62 06/12/2017 03:15 PM   FREET4 0.84 06/09/2013 12:05 PM    CBC Latest Ref Rng & Units 08/03/2020 07/28/2020 07/27/2020  WBC 4.0 - 10.5 K/uL 8.5 8.1 11.7(H)  Hemoglobin 13.0 - 17.0 g/dL 11.0(L) 10.0(L) 10.4(L)  Hematocrit 39.0 - 52.0 % 36.6(L) 34.8(L) 34.1(L)  Platelets 150 - 400 K/uL 241 165 190    Lab Results  Component Value Date/Time   VD25OH 21.04 (L) 02/23/2016 02:05 PM   VD25OH 31.68 06/25/2015 09:38 AM    Clinical ASCVD: Yes  The ASCVD Risk score Mikey Bussing DC Jr., et al., 2013) failed to calculate for the following reasons:   The patient has a prior MI or stroke diagnosis     CHA2DS2-VASc Score = 6  }This indicates a 9.7% annual risk of stroke. The patient's score is based upon: CHF History: Yes HTN History: Yes Diabetes History: No Stroke History: Yes Vascular Disease History: Yes Age Score: 1 Gender Score: 0    Social  History   Tobacco Use  Smoking Status Former Smoker  . Packs/day: 2.00  . Years: 25.00  . Pack years: 50.00  . Types: Cigarettes  . Quit date: 09/26/2009  . Years since quitting: 11.5  Smokeless Tobacco Never Used   BP Readings from Last 3 Encounters:  03/24/21 124/66  01/31/21 (!) 130/112  12/10/20 140/80   Pulse Readings from Last 3 Encounters:  03/24/21 97  08/23/20 71  08/19/20 74   Wt Readings from Last 3 Encounters:  03/24/21 234 lb 6.4 oz (106.3 kg)  03/23/21 250 lb (113.4 kg)  03/09/21 268 lb (121.6 kg)    Assessment: Review of patient past medical history, allergies, medications, health status,  including review of consultants reports, laboratory and other test data, was performed as part of comprehensive evaluation and provision of chronic care management services.   SDOH:  (Social Determinants of Health) assessments and interventions performed:    CCM Care Plan  Allergies  Allergen Reactions  . Clonidine Derivatives     Reaction unknown  . Sulfa Antibiotics     Reaction unknown    Medications Reviewed Today    Reviewed by De Hollingshead, RPH-CPP (Pharmacist) on 04/14/21 at 1340  Med List Status: <None>  Medication Order Taking? Sig Documenting Provider Last Dose Status Informant  albuterol (PROVENTIL) (2.5 MG/3ML) 0.083% nebulizer solution [id number] No Take 3 mLs (2.5 mg total) by nebulization every 6 (six) hours as needed for wheezing or shortness of breath.  Patient not taking: Reported on 04/14/2021   Loletha Grayer, MD Not Taking Active Nursing Home Medication Administration Guide (MAG)  Fluticasone-Umeclidin-Vilant (TRELEGY ELLIPTA) 100-62.5-25 MCG/INH AEPB [id number] Yes Inhale 1 puff into the lungs daily. Crecencio Mc, MD Taking Active   furosemide (LASIX) 40 MG tablet [id number] Yes Take 1 tablet (40 mg total) by mouth 2 (two) times daily.  Patient taking differently: Take 40 mg by mouth 2 (two) times daily. Pt states that he takes as needed   Loletha Grayer, MD Taking Active            Med Note Darnelle Maffucci, Arville Lime   Thu Apr 14, 2021  1:35 PM) Taking PRN  ipratropium (ATROVENT) 0.03 % nasal spray [id number] Yes Place into both nostrils 2 (two) times daily as needed. [provider] Taking Active   ipratropium-albuterol (DUONEB) 0.5-2.5 (3) MG/3ML SOLN [id number] No USE 1 VIAL IN NEBULIZER EVERY 6 HOURS AS NEEDED FOR WHEEZING  Patient not taking: Reported on 04/14/2021   Crecencio Mc, MD Not Taking Active   losartan (COZAAR) 25 MG tablet [id number] Yes Take 1 tablet (25 mg total) by mouth daily. Crecencio Mc, MD Taking Active   methocarbamol  (ROBAXIN) 500 MG tablet [id number] No Take 500 mg by mouth every 6 (six) hours as needed.  Patient not taking: Reported on 04/14/2021   [provider] Not Taking Active   nitroGLYCERIN (NITROSTAT) 0.4 MG SL tablet [id number]  Place 1 tablet (0.4 mg total) under the tongue every 5 (five) minutes as needed for chest pain. Maximum dose 3 tablets Crecencio Mc, MD  Active Nursing Home Medication Administration Guide (MAG)           Med Note Leslie Dales, ANNA E   Tue Aug 13, 2018  5:33 PM)    oxyCODONE (ROXICODONE) 15 MG immediate release tablet [id number]  Take 1 tablet (15 mg total) by mouth every 6 (six) hours as needed. Crecencio Mc, MD  Active   oxyCODONE (ROXICODONE) 15 MG  immediate release tablet [id number] Yes Take 1 tablet (15 mg total) by mouth every 6 (six) hours as needed for pain. Crecencio Mc, MD Taking Active            Med Note Darnelle Maffucci, Maralyn Sago Apr 14, 2021  1:38 PM) Taking 2-3 tablets per day  oxyCODONE (ROXICODONE) 15 MG immediate release tablet [id number]  Take 1 tablet (15 mg total) by mouth every 6 (six) hours as needed. Crecencio Mc, MD  Active   OXYGEN [id number] Yes Inhale 2 L into the lungs 3 (three) times daily as needed (shortness of breath).  [provider] Taking Active Nursing Home Medication Administration Guide (MAG)  rivaroxaban (XARELTO) 20 MG TABS tablet [id number] Yes Take 20 mg by mouth daily with supper. [provider] Taking Active   simvastatin (ZOCOR) 20 MG tablet [id number] Yes Take 1 tablet (20 mg total) by mouth at bedtime. Crecencio Mc, MD Taking Active   spironolactone (ALDACTONE) 25 MG tablet [id number] Yes Take 1 tablet (25 mg total) by mouth daily. Crecencio Mc, MD Taking Active   traZODone (DESYREL) 50 MG tablet [id number] Yes Take 1 tablet (50 mg total) by mouth at bedtime. Crecencio Mc, MD Taking Active            Med Note Darnelle Maffucci, Maralyn Sago Apr 14, 2021  1:38 PM) Taking PRN  VENTOLIN HFA 108 (90  Base) MCG/ACT inhaler [id number] No INHALE 2 PUFFS BY MOUTH EVERY 6 HOURS AS NEEDED FOR WHEEZING OR SHORTNESS OF BREATH  Patient not taking: Reported on 04/14/2021   Crecencio Mc, MD Not Taking Active   vitamin B-12 (CYANOCOBALAMIN) 1000 MCG tablet [id number] Yes Take 1,000 mcg by mouth daily. [provider] Taking Active           Patient Active Problem List   Diagnosis Date Noted  . Insomnia due to psychological stress 03/26/2021  . Failure to thrive syndrome, adult 03/23/2021  . DVT (deep venous thrombosis) ([place])   . Leukocytosis   . Hematuria, gross 07/19/2020  . Acquired thrombophilia ([place]) 07/04/2020  . Atrial fibrillation ([place])   . CKD (chronic kidney disease), stage IIIa 03/30/2020  . Degenerative joint disease of shoulder region 09/26/2018  . Obstructive sleep apnea 11/22/2016  . Hypogonadism male 07/07/2016  . Obesity 06/03/2016  . Reduced libido 05/11/2016  . Visit for preventive health examination 03/01/2016  . Diabetes mellitus with circulatory complication ([place]) 37/09/6268  . Shoulder pain, left 08/03/2015  . Pneumonia 04/13/2015  . Marital conflict involving estrangement 04/13/2015  . Cervical radiculopathy due to degenerative joint disease of spine 03/24/2015  . Iron deficiency anemia 02/25/2015  . Nocturia 02/25/2015  . Pernicious anemia 01/14/2015  . S/P hip replacement 12/12/2014  . S/p nephrectomy 09/07/2014  . Vertigo, central 08/20/2014  . Senile purpura ([place]) 06/28/2014  . Incomplete emptying of bladder 10/08/2013  . Anemia 09/11/2013  . Chest pain with high risk for cardiac etiology 04/08/2013  . Benign localized prostatic hyperplasia with lower urinary tract symptoms (LUTS) 10/23/2012  . Sciatica 10/23/2012  . Venous insufficiency (chronic) (peripheral) 07/08/2012  . Venous insufficiency   . History of alcohol abuse   . Vasomotor rhinitis 12/06/2011  . CAD (coronary artery disease) 10/12/2011  . COPD (chronic obstructive pulmonary  disease) ([place])   . CHF with left ventricular diastolic dysfunction, NYHA class 2 ([place])   . Hyperlipidemia   . Hypertension     Immunization History  Administered Date(s)  Administered  . Influenza Split 10/09/2011, 12/03/2012  . Influenza, High Dose Seasonal PF 09/01/2016, 09/12/2017, 12/20/2018  . Influenza,inj,Quad PF,6+ Mos 09/10/2013, 09/19/2014  . PFIZER(Purple Top)SARS-COV-2 Vaccination 10/01/2020, 11/02/2020  . Pneumococcal Conjugate-13 02/23/2016  . Pneumococcal Polysaccharide-23 02/21/2010, 12/20/2018  . Tdap 05/25/2010    Conditions to be addressed/monitored: Atrial Fibrillation, CHF, HTN, HLD and COPD  Care Plan : Medication Management  Updates made by De Hollingshead, RPH-CPP since 04/14/2021 12:00 AM    Problem: Heart Failure, COPD     Long-Range Goal: Disease Progression Prevention   This Visit's Progress: On track  Recent Progress: On track  Priority: High  Note:   Current Barriers:  . Unable to independently monitor therapeutic efficacy . Complex patient with multiple comorbidities including heart failure, COPD  Pharmacist Clinical Goal(s):  Marland Kitchen Over the next 90 days, patient will maintain control of heart failure as evidenced by no unnecessary hospitalizations . Over the next 90 days, patient will adhere to prescribed medication regimen.  Interventions: . Inter-disciplinary care team collaboration (see longitudinal plan of care) . Comprehensive medication review performed; medication list updated in electronic medical record  Health Maintenance: . Reports today that PT came out and tried to give him exercises, but he decided he didn't need those. He can't remember if a nurse came out for him, as there have been "lots" of people out caring for his wife. He has declined home health because he does not feel he needs it. Notes that he can cook, do laundry (he was doing this before he called me), feed himself. Encouraged to discuss this more fully w/ PCP at next  visit . Discussed [facility] [employer]. He notes that he is not going to use that moving forward because he plans to get back into driving. He has a friend that will ride with him to appointments to take over driving if needed.  . Reports that he plans to follow up with Dr. Bernardo Heater when able.  Heart Failure (recovered, EF 55%): Marland Kitchen Controlled; current treatment: losartan 25 mg QPM, spironolactone 25 mg QAM, furosemide 40 mg QAM PRN . Reports weight has remained "stable" between 234 -236 lbs. Takes furosemide if weight trends up . Reviewed pill bottles today.  . Patient has f/u with cardiology and PCP on the same day. He notes that he will call and reschedule cardiology as he is more interested in follow up with Dr. Derrel Nip. Advised to reschedule, rather than cancel, Dr. Donivan Scull appointment  Hyperlipidemia and Secondary ASCVD Prevention - hx MI, CVA x2 . Controlled per last lab work; current treatment: simvastatin 20 mg QPM . No antiplatelet therapy d/t concurrent anticoagulant . Reviewed pill bottles today.  . Recommended to continue current regimen at this time.    Chronic Obstructive Pulmonary Disease: . Controlled; current treatment: Trelegy 100/62.5/25 mcg daily, albuterol HFA PRN or Duonebs TID; oxygen 24 hours; reports minimal need for rescue therapy lately . Recommended to continue current regimen.  Atrial Fibrillation: . Appropriately managed; current rate/rhythm control: none d/t bradycardia; anticoagulant treatment: Xarelto 20 mg QPM . Will continue to follow for need to switch back to [employer] program if patient hits [employer] . Recommend to continue current regimen at this time.   Chronic Pain: . Appropriately managed at this time; oxycodone 15 mg Q6H PRN, notes that he generally takes 2-3 doses daily . Recommend to continue current regimen and collaboration with Dr. Derrel Nip at this time. Continue to monitor for risk of overdose given baseline respiratory disease.  Insomnia: . Improved; current regimen: trazodone 25 mg QPM PRN, indicates improvement with taking the nights he needs help sleeping . Recommend to continue current regimen at this time  Patient Goals/Self-Care Activities . Over the next 90 days, patient will:  - take medications as prescribed weigh daily, and contact provider if weight gain of >3 lbs/day or 5 lbs/week collaborate with provider on medication access solutions  Follow Up Plan: Telephone follow up appointment with care management team member scheduled for: ~ 4 weeks with next PCP visit as previously scheduled      Medication Assistance: None required.  Patient affirms current coverage meets needs.  Patient's preferred pharmacy is:  [facility] [place], [place] [address] Phone: [phone] Fax: [phone]  [facility] [place], [place] [address] Phone: [phone] Fax: [phone]  [facility] [address]), [place] - [place])  [address] Phone: [phone] Fax: [phone]  Uses pill box? No - uses a pill bag system Pt endorses 100% compliance  Follow Up:  Patient agrees to Care Plan and Follow-up.  Plan: Face to Face appointment with care management team member scheduled for: ~ 3 weeks as previously scheduled  Catie Darnelle Maffucci, PharmD, [place], [place] Clinical Pharmacist [employer] at [facility] [phone]

## 2021-04-14 NOTE — Telephone Encounter (Signed)
Does this patient need to be scheduled see that you spoke with him today
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
## 2021-05-02 ENCOUNTER — Telehealth: Payer: Self-pay | Admitting: Cardiovascular Disease

## 2021-05-02 NOTE — Telephone Encounter (Signed)
Patient cancelled visit.  Wife admitted to hospice with expectation of 2 weeks.  Patient states he is heartbroken .  He will call back to reschedule.

## 2021-05-03 ENCOUNTER — Ambulatory Visit: Payer: Medicare HMO | Admitting: Internal Medicine

## 2021-05-03 ENCOUNTER — Telehealth: Payer: Self-pay | Admitting: Pharmacist

## 2021-05-03 ENCOUNTER — Ambulatory Visit: Payer: Medicare HMO

## 2021-05-03 ENCOUNTER — Ambulatory Visit: Payer: Medicare HMO | Admitting: Cardiovascular Disease

## 2021-05-03 DIAGNOSIS — Z0289 Encounter for other administrative examinations: Secondary | ICD-10-CM

## 2021-05-03 DIAGNOSIS — I4819 Other persistent atrial fibrillation: Secondary | ICD-10-CM

## 2021-05-03 DIAGNOSIS — I5042 Chronic combined systolic (congestive) and diastolic (congestive) heart failure: Secondary | ICD-10-CM

## 2021-05-03 DIAGNOSIS — E782 Mixed hyperlipidemia: Secondary | ICD-10-CM

## 2021-05-03 DIAGNOSIS — I5033 Acute on chronic diastolic (congestive) heart failure: Secondary | ICD-10-CM

## 2021-05-03 DIAGNOSIS — I10 Essential (primary) hypertension: Secondary | ICD-10-CM

## 2021-05-03 NOTE — Telephone Encounter (Signed)
  Chronic Care Management   Note  05/03/2021 Name: Victor Daniels MRN: [id number] DOB: Sep 20, 1946   Patient did not show for appointment today. Per chart review, he canceled Dr. Windell Hummingbird appointment today due to his wife transitioning to Hospice.   Plan: - If I do not hear back from the patient by end of business today, will collaborate with Care Guide to outreach to schedule follow up with me   Catie Feliz Beam, PharmD, [place], CPP Clinical Pharmacist [employer] [facility] [employer] [phone]

## 2021-05-06 NOTE — Telephone Encounter (Signed)
Patient has been scheduled

## 2021-05-24 ENCOUNTER — Telehealth: Payer: Self-pay | Admitting: Internal Medicine

## 2021-05-24 NOTE — Telephone Encounter (Signed)
Patient called in stated that his legs are swollen and in pain and he also wanted Dr.Tullo to set him up with homehealthcare he was transferred to access nurse

## 2021-05-24 NOTE — Telephone Encounter (Signed)
Patient has been scheduled 05/25/21 with Dr. Shirlee Latch -[facility] [place]

## 2021-05-24 NOTE — Telephone Encounter (Signed)
Nurse Line transfer PT to me to schedule appt with provider. Provider had nothing available so schedule with TMS for issues.

## 2021-05-24 NOTE — Telephone Encounter (Signed)
FYI

## 2021-05-25 ENCOUNTER — Telehealth: Payer: Self-pay | Admitting: Internal Medicine

## 2021-05-25 ENCOUNTER — Ambulatory Visit: Payer: Medicare HMO | Admitting: Internal Medicine

## 2021-05-25 NOTE — Telephone Encounter (Signed)
Noted  
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
## 2021-05-25 NOTE — Telephone Encounter (Signed)
Patient no-showed today's appointment; appointment was for 05/25/21, provider notified for review of record. Letter sent for patient to call in and re-schedule.  

## 2021-05-26 ENCOUNTER — Ambulatory Visit: Payer: Medicare HMO | Admitting: Internal Medicine

## 2021-05-26 ENCOUNTER — Other Ambulatory Visit: Payer: Self-pay | Admitting: Internal Medicine

## 2021-05-26 ENCOUNTER — Encounter: Payer: Self-pay | Admitting: Internal Medicine

## 2021-05-26 ENCOUNTER — Telehealth: Payer: Self-pay

## 2021-05-26 ENCOUNTER — Other Ambulatory Visit: Payer: Self-pay

## 2021-05-26 ENCOUNTER — Telehealth: Payer: Self-pay | Admitting: *Deleted

## 2021-05-26 ENCOUNTER — Telehealth (INDEPENDENT_AMBULATORY_CARE_PROVIDER_SITE_OTHER): Payer: Self-pay

## 2021-05-26 ENCOUNTER — Ambulatory Visit: Payer: Medicare HMO | Admitting: Pharmacist

## 2021-05-26 ENCOUNTER — Telehealth: Payer: Self-pay | Admitting: Internal Medicine

## 2021-05-26 VITALS — BP 142/66 | HR 93 | Temp 98.5°F | Ht 72.0 in | Wt 240.0 lb

## 2021-05-26 DIAGNOSIS — R079 Chest pain, unspecified: Secondary | ICD-10-CM

## 2021-05-26 DIAGNOSIS — R600 Localized edema: Secondary | ICD-10-CM

## 2021-05-26 DIAGNOSIS — Z23 Encounter for immunization: Secondary | ICD-10-CM | POA: Diagnosis not present

## 2021-05-26 DIAGNOSIS — R269 Unspecified abnormalities of gait and mobility: Secondary | ICD-10-CM

## 2021-05-26 DIAGNOSIS — I5030 Unspecified diastolic (congestive) heart failure: Secondary | ICD-10-CM

## 2021-05-26 DIAGNOSIS — I890 Lymphedema, not elsewhere classified: Secondary | ICD-10-CM | POA: Diagnosis not present

## 2021-05-26 DIAGNOSIS — E782 Mixed hyperlipidemia: Secondary | ICD-10-CM

## 2021-05-26 DIAGNOSIS — J431 Panlobular emphysema: Secondary | ICD-10-CM

## 2021-05-26 DIAGNOSIS — Z741 Need for assistance with personal care: Secondary | ICD-10-CM | POA: Diagnosis not present

## 2021-05-26 DIAGNOSIS — Z748 Other problems related to care provider dependency: Secondary | ICD-10-CM

## 2021-05-26 DIAGNOSIS — S81852A Open bite, left lower leg, initial encounter: Secondary | ICD-10-CM | POA: Diagnosis not present

## 2021-05-26 DIAGNOSIS — Z742 Need for assistance at home and no other household member able to render care: Secondary | ICD-10-CM

## 2021-05-26 DIAGNOSIS — W540XXA Bitten by dog, initial encounter: Secondary | ICD-10-CM | POA: Diagnosis not present

## 2021-05-26 DIAGNOSIS — J9611 Chronic respiratory failure with hypoxia: Secondary | ICD-10-CM

## 2021-05-26 DIAGNOSIS — Z1329 Encounter for screening for other suspected endocrine disorder: Secondary | ICD-10-CM

## 2021-05-26 DIAGNOSIS — T148XXA Other injury of unspecified body region, initial encounter: Secondary | ICD-10-CM | POA: Diagnosis not present

## 2021-05-26 DIAGNOSIS — I10 Essential (primary) hypertension: Secondary | ICD-10-CM

## 2021-05-26 DIAGNOSIS — I4819 Other persistent atrial fibrillation: Secondary | ICD-10-CM

## 2021-05-26 DIAGNOSIS — J449 Chronic obstructive pulmonary disease, unspecified: Secondary | ICD-10-CM

## 2021-05-26 DIAGNOSIS — M79672 Pain in left foot: Secondary | ICD-10-CM

## 2021-05-26 DIAGNOSIS — R7989 Other specified abnormal findings of blood chemistry: Secondary | ICD-10-CM

## 2021-05-26 DIAGNOSIS — M25572 Pain in left ankle and joints of left foot: Secondary | ICD-10-CM

## 2021-05-26 DIAGNOSIS — I25118 Atherosclerotic heart disease of native coronary artery with other forms of angina pectoris: Secondary | ICD-10-CM

## 2021-05-26 DIAGNOSIS — M79605 Pain in left leg: Secondary | ICD-10-CM

## 2021-05-26 LAB — CBC WITH DIFFERENTIAL/PLATELET
Basophils Absolute: 0.1 10*3/uL (ref 0.0–0.1)
Basophils Relative: 1 % (ref 0.0–3.0)
Eosinophils Absolute: 0.3 10*3/uL (ref 0.0–0.7)
Eosinophils Relative: 4.5 % (ref 0.0–5.0)
HCT: 32.3 % — ABNORMAL LOW (ref 39.0–52.0)
Hemoglobin: 10.1 g/dL — ABNORMAL LOW (ref 13.0–17.0)
Lymphocytes Relative: 12.3 % (ref 12.0–46.0)
Lymphs Abs: 0.9 10*3/uL (ref 0.7–4.0)
MCHC: 31.3 g/dL (ref 30.0–36.0)
MCV: 80.9 fl (ref 78.0–100.0)
Monocytes Absolute: 0.8 10*3/uL (ref 0.1–1.0)
Monocytes Relative: 11 % (ref 3.0–12.0)
Neutro Abs: 5.4 10*3/uL (ref 1.4–7.7)
Neutrophils Relative %: 71.2 % (ref 43.0–77.0)
Platelets: 257 10*3/uL (ref 150.0–400.0)
RBC: 4 Mil/uL — ABNORMAL LOW (ref 4.22–5.81)
RDW: 18.4 % — ABNORMAL HIGH (ref 11.5–15.5)
WBC: 7.5 10*3/uL (ref 4.0–10.5)

## 2021-05-26 LAB — BRAIN NATRIURETIC PEPTIDE: Brain Natriuretic Peptide: 212 pg/mL — ABNORMAL HIGH (ref <100)

## 2021-05-26 LAB — D-DIMER, QUANTITATIVE: D-Dimer, Quant: 3.63 mcg/mL FEU — ABNORMAL HIGH (ref <0.50)

## 2021-05-26 LAB — TSH: TSH: 1.84 u[IU]/mL (ref 0.35–4.50)

## 2021-05-26 MED ORDER — FUROSEMIDE 40 MG PO TABS: 40.0000 mg | ORAL_TABLET | Freq: Two times a day (BID) | ORAL | 1 refills | Status: DC

## 2021-05-26 MED ORDER — OXYCODONE HCL 15 MG PO TABS: 15.0000 mg | ORAL_TABLET | Freq: Four times a day (QID) | ORAL | 0 refills | Status: DC | PRN

## 2021-05-26 MED ORDER — CIPROFLOXACIN HCL 500 MG PO TABS: 500.0000 mg | ORAL_TABLET | Freq: Two times a day (BID) | ORAL | 0 refills | Status: DC

## 2021-05-26 MED ORDER — SPIRONOLACTONE 50 MG PO TABS: 50.0000 mg | ORAL_TABLET | Freq: Every day | ORAL | 1 refills | Status: AC

## 2021-05-26 MED ORDER — MUPIROCIN 2 % EX OINT: 1.0000 "application " | TOPICAL_OINTMENT | Freq: Two times a day (BID) | CUTANEOUS | 2 refills | Status: AC

## 2021-05-26 MED ORDER — CIPROFLOXACIN HCL 500 MG PO TABS: 500.0000 mg | ORAL_TABLET | Freq: Two times a day (BID) | ORAL | 0 refills | Status: AC

## 2021-05-26 MED ORDER — DOXYCYCLINE HYCLATE 100 MG PO TABS: 100.0000 mg | ORAL_TABLET | Freq: Two times a day (BID) | ORAL | 0 refills | Status: DC

## 2021-05-26 NOTE — Telephone Encounter (Signed)
Left message informing the Patient that his appointment is at 11:30 am

## 2021-05-26 NOTE — Chronic Care Management (AMB) (Signed)
Chronic Care Management Pharmacy Note  05/26/2021 Name:  Victor Daniels MRN:  [id number] DOB:  21-Jan-1946  Subjective: Victor Daniels is an 75 y.o. year old male who is a primary patient of Tullo, Aris Everts, MD.  The CCM team was consulted for assistance with disease management and care coordination needs.    Engaged with patient face to face for follow up visit in response to provider referral for pharmacy case management and/or care coordination services.   Consent to Services:  The patient was given information about Chronic Care Management services, agreed to services, and gave verbal consent prior to initiation of services.  Please see initial visit note for detailed documentation.   Patient Care Team: Crecencio Mc, MD as PCP - General (Internal Medicine) Rockey Situ Kathlene November, MD as PCP - Cardiology (Cardiology) Crecencio Mc, MD as Referring Physician (Internal Medicine) Lucky Cowboy Erskine Squibb, MD as Surgeon (Surgery) Wilhelmina Mcardle, MD (Inactive) as Consulting Physician (Pulmonary Disease) De Hollingshead, RPH-CPP as Pharmacist (Pharmacist) Leona Singleton, RN as [place] Management   Objective:  Lab Results  Component Value Date   CREATININE 1.18 02/11/2021   CREATININE 1.33 08/17/2020   CREATININE 1.26 (H) 08/03/2020    Lab Results  Component Value Date   HGBA1C 6.1 (H) 02/11/2021   Last diabetic Eye exam:  Lab Results  Component Value Date/Time   HMDIABEYEEXA No Retinopathy 08/14/2017 12:00 AM    Last diabetic Foot exam: No results found for: HMDIABFOOTEX      Component Value Date/Time   CHOL 103 02/11/2021 1430   TRIG 54 02/11/2021 1430   HDL 37 (L) 02/11/2021 1430   CHOLHDL 2.8 02/11/2021 1430   VLDL 20.8 03/15/2020 1051   LDLCALC 53 02/11/2021 1430   LDLDIRECT 72.0 12/01/2016 1459    Hepatic Function Latest Ref Rng & Units 02/11/2021 08/03/2020 07/23/2020  Total Protein 6.1 - 8.1 g/dL 6.8 8.1 7.8  Albumin 3.5 -  5.0 g/dL - 3.3(L) 3.3(L)  AST 10 - 35 U/L '19 26 16  ' ALT 9 - 46 U/L '20 30 12  ' Alk Phosphatase 38 - 126 U/L - 88 86  Total Bilirubin 0.2 - 1.2 mg/dL 0.9 1.2 1.5(H)    Lab Results  Component Value Date/Time   TSH 2.088 06/23/2020 05:26 AM   TSH 1.52 03/15/2020 10:51 AM   TSH 0.62 06/12/2017 03:15 PM   FREET4 0.84 06/09/2013 12:05 PM    CBC Latest Ref Rng & Units 08/03/2020 07/28/2020 07/27/2020  WBC 4.0 - 10.5 K/uL 8.5 8.1 11.7(H)  Hemoglobin 13.0 - 17.0 g/dL 11.0(L) 10.0(L) 10.4(L)  Hematocrit 39.0 - 52.0 % 36.6(L) 34.8(L) 34.1(L)  Platelets 150 - 400 K/uL 241 165 190    Lab Results  Component Value Date/Time   VD25OH 21.04 (L) 02/23/2016 02:05 PM   VD25OH 31.68 06/25/2015 09:38 AM    Clinical ASCVD: Yes  The ASCVD Risk score Mikey Bussing DC Jr., et al., 2013) failed to calculate for the following reasons:   The patient has a prior MI or stroke diagnosis     CHA2DS2-VASc Score = 6  This indicates a 9.7% annual risk of stroke. The patient's score is based upon: CHF History: Yes HTN History: Yes Diabetes History: No Stroke History: Yes Vascular Disease History: Yes Age Score: 1 Gender Score: 0     Social History   Tobacco Use  Smoking Status Former Smoker  . Packs/day: 2.00  . Years: 25.00  . Pack years: 50.00  .  Types: Cigarettes  . Quit date: 09/26/2009  . Years since quitting: 11.6  Smokeless Tobacco Never Used   BP Readings from Last 3 Encounters:  05/26/21 (!) 142/66  03/24/21 124/66  01/31/21 (!) 130/112   Pulse Readings from Last 3 Encounters:  05/26/21 93  03/24/21 97  08/23/20 71   Wt Readings from Last 3 Encounters:  05/26/21 240 lb (108.9 kg)  03/24/21 234 lb 6.4 oz (106.3 kg)  03/23/21 250 lb (113.4 kg)    Assessment: Review of patient past medical history, allergies, medications, health status, including review of consultants reports, laboratory and other test data, was performed as part of comprehensive evaluation and provision of chronic care  management services.   SDOH:  (Social Determinants of Health) assessments and interventions performed:  SDOH Interventions   Flowsheet Row Most Recent Value  SDOH Interventions   SDOH Interventions for the Following Domains Depression  Transportation Interventions Other (Comment)  [care guide referral]  Depression Interventions/Treatment  Counseling  [LCSW referral]      CCM Care Plan  Allergies  Allergen Reactions  . Clonidine Derivatives     Reaction unknown  . Sulfa Antibiotics     Reaction unknown    Medications Reviewed Today    Reviewed by Thressa Sheller, CMA (Certified Medical Assistant) on 05/26/21 at 1152  Med List Status: <None>  Medication Order Taking? Sig Documenting Provider Last Dose Status Informant  albuterol (PROVENTIL) (2.5 MG/3ML) 0.083% nebulizer solution [id number] Yes Take 3 mLs (2.5 mg total) by nebulization every 6 (six) hours as needed for wheezing or shortness of breath. Loletha Grayer, MD Taking Active   Fluticasone-Umeclidin-Vilant (TRELEGY ELLIPTA) 100-62.5-25 MCG/INH AEPB [id number] Yes Inhale 1 puff into the lungs daily. Crecencio Mc, MD Taking Active   furosemide (LASIX) 40 MG tablet [id number] Yes Take 1 tablet (40 mg total) by mouth 2 (two) times daily.  Patient taking differently: Take 40 mg by mouth 2 (two) times daily. Pt states that he takes as needed   Loletha Grayer, MD Taking Active            Med Note Darnelle Maffucci, Arville Lime   Thu Apr 14, 2021  1:35 PM) Taking PRN  ipratropium (ATROVENT) 0.03 % nasal spray [id number] Yes Place into both nostrils 2 (two) times daily as needed. [provider] Taking Active   ipratropium-albuterol (DUONEB) 0.5-2.5 (3) MG/3ML SOLN [id number] Yes USE 1 VIAL IN NEBULIZER EVERY 6 HOURS AS NEEDED FOR WHEEZING Derrel Nip, Aris Everts, MD Taking Active   losartan (COZAAR) 25 MG tablet [id number] Yes Take 1 tablet (25 mg total) by mouth daily. Crecencio Mc, MD Taking Active   methocarbamol (ROBAXIN) 500 MG  tablet [id number] Yes Take 500 mg by mouth every 6 (six) hours as needed. [provider] Taking Active   nitroGLYCERIN (NITROSTAT) 0.4 MG SL tablet [id number] Yes Place 1 tablet (0.4 mg total) under the tongue every 5 (five) minutes as needed for chest pain. Maximum dose 3 tablets Crecencio Mc, MD Taking Active Nursing Home Medication Administration Guide (MAG)           Med Note Leslie Dales, ANNA E   Tue Aug 13, 2018  5:33 PM)    OVER THE COUNTER MEDICATION [id number] Yes Super Beta Prostate Supplement [provider] Taking Active   oxyCODONE (ROXICODONE) 15 MG immediate release tablet [id number] Yes Take 1 tablet (15 mg total) by mouth every 6 (six) hours as needed. Crecencio Mc, MD Taking Active   oxyCODONE (ROXICODONE) 15  MG immediate release tablet [id number] Yes Take 1 tablet (15 mg total) by mouth every 6 (six) hours as needed for pain. Crecencio Mc, MD Taking Active            Med Note Darnelle Maffucci, Maralyn Sago Apr 14, 2021  1:38 PM) Taking 2-3 tablets per day  oxyCODONE (ROXICODONE) 15 MG immediate release tablet [id number] Yes Take 1 tablet (15 mg total) by mouth every 6 (six) hours as needed. Crecencio Mc, MD Taking Active   OXYGEN [id number] Yes Inhale 2 L into the lungs 3 (three) times daily as needed (shortness of breath).  [provider] Taking Active Nursing Home Medication Administration Guide (MAG)  rivaroxaban (XARELTO) 20 MG TABS tablet [id number] Yes Take 20 mg by mouth daily with supper. [provider] Taking Active   simvastatin (ZOCOR) 20 MG tablet [id number] Yes Take 1 tablet (20 mg total) by mouth at bedtime. Crecencio Mc, MD Taking Active   spironolactone (ALDACTONE) 25 MG tablet [id number] Yes Take 1 tablet (25 mg total) by mouth daily. Crecencio Mc, MD Taking Active   traZODone (DESYREL) 50 MG tablet [id number] Yes Take 1 tablet (50 mg total) by mouth at bedtime. Crecencio Mc, MD Taking Active            Med Note Nat Christen Apr 14, 2021  1:38 PM) Taking PRN  VENTOLIN HFA 108 (90 Base) MCG/ACT inhaler [id number] Yes INHALE 2 PUFFS BY MOUTH EVERY 6 HOURS AS NEEDED FOR WHEEZING OR SHORTNESS OF BREATH Crecencio Mc, MD Taking Active   vitamin B-12 (CYANOCOBALAMIN) 1000 MCG tablet [id number] Yes Take 1,000 mcg by mouth daily. [provider] Taking Active           Patient Active Problem List   Diagnosis Date Noted  . Lymphedema 05/26/2021  . Abnormal gait 05/26/2021  . Insomnia due to psychological stress 03/26/2021  . Failure to thrive syndrome, adult 03/23/2021  . DVT (deep venous thrombosis) ([place])   . Leukocytosis   . Hematuria, gross 07/19/2020  . Acquired thrombophilia ([place]) 07/04/2020  . Atrial fibrillation ([place])   . CKD (chronic kidney disease), stage IIIa 03/30/2020  . Degenerative joint disease of shoulder region 09/26/2018  . Obstructive sleep apnea 11/22/2016  . Hypogonadism male 07/07/2016  . Obesity 06/03/2016  . Reduced libido 05/11/2016  . Visit for preventive health examination 03/01/2016  . Diabetes mellitus with circulatory complication ([place]) 97/98/9211  . Shoulder pain, left 08/03/2015  . Pneumonia 04/13/2015  . Marital conflict involving estrangement 04/13/2015  . Cervical radiculopathy due to degenerative joint disease of spine 03/24/2015  . Iron deficiency anemia 02/25/2015  . Nocturia 02/25/2015  . Pernicious anemia 01/14/2015  . S/P hip replacement 12/12/2014  . S/p nephrectomy 09/07/2014  . Vertigo, central 08/20/2014  . Senile purpura ([place]) 06/28/2014  . Incomplete emptying of bladder 10/08/2013  . Anemia 09/11/2013  . Chest pain with high risk for cardiac etiology 04/08/2013  . Benign localized prostatic hyperplasia with lower urinary tract symptoms (LUTS) 10/23/2012  . Sciatica 10/23/2012  . Venous insufficiency (chronic) (peripheral) 07/08/2012  . Venous insufficiency   . History of alcohol abuse   . Vasomotor rhinitis 12/06/2011  . CAD  (coronary artery disease) 10/12/2011  . COPD (chronic obstructive pulmonary disease) ([place])   . CHF with left ventricular diastolic dysfunction, NYHA class 2 ([place])   . Hyperlipidemia   . Hypertension     Immunization History  Administered Date(s)  Administered  . Influenza Split 10/09/2011, 12/03/2012  . Influenza, High Dose Seasonal PF 09/01/2016, 09/12/2017, 12/20/2018  . Influenza,inj,Quad PF,6+ Mos 09/10/2013, 09/19/2014  . PFIZER(Purple Top)SARS-COV-2 Vaccination 10/01/2020, 11/02/2020  . Pneumococcal Conjugate-13 02/23/2016  . Pneumococcal Polysaccharide-23 02/21/2010, 12/20/2018  . Td 05/26/2021  . Tdap 05/25/2010    Conditions to be addressed/monitored: CHF, CAD, HTN, HLD and COPD  Care Plan : PharmD - Medication Management  Updates made by De Hollingshead, RPH-CPP since 05/26/2021 12:00 AM    Problem: Heart Failure, COPD     Long-Range Goal: Disease Progression Prevention   This Visit's Progress: On track  Recent Progress: On track  Priority: High  Note:   Current Barriers:  . Unable to independently monitor therapeutic efficacy . Complex patient with multiple comorbidities including heart failure, COPD  Pharmacist Clinical Goal(s):  Marland Kitchen Over the next 90 days, patient will maintain control of heart failure as evidenced by no unnecessary hospitalizations . Over the next 90 days, patient will adhere to prescribed medication regimen.  Interventions: . Inter-disciplinary care team collaboration (see longitudinal plan of care) . Comprehensive medication review performed; medication list updated in electronic medical record  Health Maintenance: . Wife passed away 3 weeks ago. Comes to the office today driven by his neighbor, Vicente Serene. Wounds on legs. Not taking his medications regularly. Admits to being depressed and not wanting to take care of himself, but denies SI/HI. Agreeable to home health referral and CCM referral to engage w/ RN CM and LCSW for support.   . Discussed potential for placement for care, rehab. Patient declines due to having cats and dogs at home. He denies having any friends or family that will care for the animals in his absence. He does not want to have to put down his animals.  . Significant care coordination today between PCP, clinic CMAs, embedded CCM staff to support patient care.  . Confirms need for transportation for tomorrow. Urgent care guide referral placed. Collaborated with Care Guides to have transportation plan in place for imaging.  Medication Management: . Declines to use pill box because he prefers to read the names of his medications before taking. Provided medication list. Advised to hang up in his kitchen beside his medications. Advised to set alarms to remind himself to take his medications. Home health referral for RN CM for medication management needed.   Heart Failure (recovered, EF 55%): Marland Kitchen Uncontrolled, edema on exam today; current treatment: losartan 25 mg QPM, spironolactone 25 mg QAM, furosemide 40 mg QAM PRN . Spironolactone dose increased to 50 mg daily today. Follow for home BP readings.  . Needs to schedule follow up with Dr. Rockey Situ for chronic management. Will support in doing so moving forward.   Hyperlipidemia and Secondary ASCVD Prevention - hx MI, CVA x2 . Controlled per last lab work; current treatment: simvastatin 20 mg QPM . No antiplatelet therapy d/t concurrent anticoagulant . Recommended to continue current regimen at this time. Stressed importance of adherence.  . Needs to schedule follow up with Dr. Rockey Situ for chronic management. Will support in doing so moving forward.   Chronic Obstructive Pulmonary Disease: . Controlled; current treatment: Trelegy 100/62.5/25 mcg daily, albuterol HFA PRN or Duonebs TID; oxygen 24 hours; reports minimal need for rescue therapy lately . Oxygen died on the way to office. Patient needs to schedule f/u with pulm. Will support in doing so moving forward.   . Recommended to continue current regimen.   Atrial Fibrillation: . Appropriately managed; current rate/rhythm control: none  d/t bradycardia; anticoagulant treatment: Xarelto 20 mg QPM . Recommend to continue current regimen at this time. Stressed adherence as above.  . Needs to schedule follow up with Dr. Rockey Situ for chronic management. Will support in doing so moving forward.   Chronic Pain: . Appropriately managed at this time; oxycodone 15 mg Q6H PRN, notes that he generally takes 2-3 doses daily . Previously recommended to continue current regimen and collaboration with Dr. Derrel Nip at this time. Continue to monitor for risk of overdose given baseline respiratory disease. Refused pain management or other modalities.   Insomnia: . Moderately well controlled; current regimen: trazodone 25 mg QPM PRN, indicated improvement previously.  . Previously recommend to continue current regimen at this time  Patient Goals/Self-Care Activities . Over the next 90 days, patient will:  - take medications as prescribed weigh daily, and contact provider if weight gain of >3 lbs/day or 5 lbs/week collaborate with provider on medication access solutions  Follow Up Plan: Telephone follow up appointment with care management team member scheduled for: ~ 4 weeks     Medication Assistance: None required.  Patient affirms current coverage meets needs.  Patient's preferred pharmacy is:  [facility] [place], [place] [address] Phone: [phone] Fax: [phone]  [facility] [place], [place] [address] Phone: [phone] Fax: [phone]  [facility] [address]), [place]) [place] [address] Phone: [phone] Fax: [phone]  Uses pill box? No - patient refuses Pt  endorses 50% compliance  Follow Up:  Patient agrees to Care Plan and Follow-up.  Plan: Telephone follow up appointment with care management team member scheduled for:  ~ 4 weeks  Catie Darnelle Maffucci, PharmD, [place], [place] Clinical Pharmacist [employer] at [employer] [phone]

## 2021-05-26 NOTE — Patient Instructions (Addendum)
MD No Physician    Primary Contact Information  Phone Fax E-mail Address  [phone] Not available [address]   [address]   [place] [address]     Specialties     Pulmonary Disease, Cardiology       1.  2. [facility] wound clinic [address] [place] [phone]  3. [facility] [address] [place] [phone]

## 2021-05-26 NOTE — Addendum Note (Signed)
Addended by: Quentin Ore on: 05/26/2021 04:55 PM   Modules accepted: Orders

## 2021-05-26 NOTE — Telephone Encounter (Signed)
   Telephone encounter was:  Successful.  05/26/2021 Name: Victor Daniels MRN: [id number] DOB: Jul 11, 1946  Victor Daniels is a 75 y.o. year old male who is a primary care patient of Darrick Huntsman, Mar Daring, MD . The community resource team was consulted for assistance with Transportation Needs   Care guide performed the following interventions: Patient provided with information about care guide support team and interviewed to confirm resource needs.  Follow Up Plan:  No further follow up planned at this time. The patient has been provided with needed resources. Patient notified transportation scheduled and they will call him before 6 pm tonight to let him know pick up time. pt voiced understanding  Alois Cliche -[facility] Guide , Embedded Care Coordination [facility], Care Management  [phone] [address] [place] , [place] [address] Email : [email or web address]

## 2021-05-26 NOTE — Patient Instructions (Addendum)
Here is how you should take your medications. Please set yourself alarms to remind you to take your medications.   Please answer phone calls from our office have our nurse and social worker call you.  Morning: - Furosemide 40 mg daily - Spironolactone 50 mg   After Supper: - Xarelto 20 mg  - Simvastatin 20 mg  - Losartan 25 mg   Please think about whether you can care for yourself at home, or if we need to think about some sort of skilled nursing care to help you feel better and get stronger.   Catie Feliz Beam, PharmD [phone]  Visit Information  PATIENT GOALS: Goals Addressed              This Visit's Progress     Patient Stated   .  PharmD - Medication Monitoring (pt-stated)        Patient Goals/Self-Care Activities . Over the next 90 days, patient will:  - take medications as prescribed weigh daily, and contact provider if weight gain of >3 lbs/day or 5 lbs/week collaborate with provider on medication access solutions       Patient verbalizes understanding of instructions provided today and agrees to view in MyChart.  Plan: Telephone follow up appointment with care management team member scheduled for:  ~ 4 weeks  Catie Feliz Beam, PharmD, [place], CPP Clinical Pharmacist [employer] at [employer] [phone]

## 2021-05-26 NOTE — Telephone Encounter (Signed)
Patient called in at 7:39 am to confirm appointment for the day. Patient needs time of appointment.
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
## 2021-05-26 NOTE — Telephone Encounter (Signed)
   KAYLUB DETIENNE DOB: 1946/04/29 MRN: [id number]   RIDER WAIVER AND RELEASE OF LIABILITY  For purposes of improving physical access to our facilities, [facility] is pleased to partner with third parties to provide [place] patients or other authorized individuals the option of convenient, on-demand ground transportation services (the [employer]") through use of the technology service that enables users to request on-demand ground transportation from independent third-party providers.  By opting to use and accept these [employer], I, the undersigned, hereby agree on behalf of myself, and on behalf of any minor child using the [employer] for whom I am the parent or legal guardian, as follows:  1. Science writer provided to me are provided by independent third-party transportation providers who are not [employer] or employees and who are unaffiliated with [employer]. 2. [place] [facility] is neither a transportation carrier nor a common or public carrier. 3. [facility] has no control over the quality or safety of the transportation that occurs as a result of the [employer]. 4. [facility] cannot guarantee that any third-party transportation provider will complete any arranged transportation service. 5. [facility] makes no representation, warranty, or guarantee regarding the reliability, timeliness, quality, safety, suitability, or availability of any of the Transport Services or that they will be error free. 6. I fully understand that traveling by vehicle involves risks and dangers of serious bodily injury, including permanent disability, paralysis, and death. I agree, on behalf of myself and on behalf of any minor child using the Transport Services for whom I am the parent or legal guardian, that the entire risk arising out of my use of the [employer] remains solely with me, to the maximum extent permitted under applicable law. 7. The  [employer] are provided "as is" and "as available." [facility] disclaims all representations and warranties, express, implied or statutory, not expressly set out in these terms, including the implied warranties of merchantability and fitness for a particular purpose. 8. I hereby waive and release [facility], its agents, employees, officers, directors, representatives, insurers, attorneys, assigns, successors, subsidiaries, and affiliates from any and all past, present, or future claims, demands, liabilities, actions, causes of action, or suits of any kind directly or indirectly arising from acceptance and use of the [employer]. 9. I further waive and release [facility] and its affiliates from all present and future liability and responsibility for any injury or death to persons or damages to property caused by or related to the use of the [employer]. 10. I have read this Waiver and Release of Liability, and I understand the terms used in it and their legal significance. This Waiver is freely and voluntarily given with the understanding that my right (as well as the right of any minor child for whom I am the parent or legal guardian using the [employer]) to legal recourse against  in connection with the [employer] is knowingly surrendered in return for use of these services.   I attest that I read the consent document to [name], gave Mr. Romack the opportunity to ask questions and answered the questions asked (if any). I affirm that [name] then provided consent for he's participation in this program.     Evette Doffing

## 2021-05-27 ENCOUNTER — Telehealth: Payer: Self-pay | Admitting: Internal Medicine

## 2021-05-27 ENCOUNTER — Telehealth: Payer: Self-pay

## 2021-05-27 ENCOUNTER — Ambulatory Visit
Admission: RE | Admit: 2021-05-27 | Discharge: 2021-05-27 | Disposition: A | Discharge status: Home / Self Care | Payer: Medicare HMO | Source: Ambulatory Visit | Attending: Internal Medicine | Admitting: Internal Medicine

## 2021-05-27 DIAGNOSIS — M1712 Unilateral primary osteoarthritis, left knee: Secondary | ICD-10-CM | POA: Diagnosis not present

## 2021-05-27 DIAGNOSIS — Z79899 Other long term (current) drug therapy: Secondary | ICD-10-CM | POA: Diagnosis not present

## 2021-05-27 DIAGNOSIS — Z87891 Personal history of nicotine dependence: Secondary | ICD-10-CM | POA: Diagnosis not present

## 2021-05-27 DIAGNOSIS — I130 Hypertensive heart and chronic kidney disease with heart failure and stage 1 through stage 4 chronic kidney disease, or unspecified chronic kidney disease: Secondary | ICD-10-CM | POA: Diagnosis not present

## 2021-05-27 DIAGNOSIS — I255 Ischemic cardiomyopathy: Secondary | ICD-10-CM | POA: Diagnosis present

## 2021-05-27 DIAGNOSIS — G4733 Obstructive sleep apnea (adult) (pediatric): Secondary | ICD-10-CM | POA: Diagnosis present

## 2021-05-27 DIAGNOSIS — J811 Chronic pulmonary edema: Secondary | ICD-10-CM | POA: Diagnosis not present

## 2021-05-27 DIAGNOSIS — L03116 Cellulitis of left lower limb: Secondary | ICD-10-CM | POA: Diagnosis not present

## 2021-05-27 DIAGNOSIS — I890 Lymphedema, not elsewhere classified: Secondary | ICD-10-CM

## 2021-05-27 DIAGNOSIS — Z6829 Body mass index (BMI) 29.0-29.9, adult: Secondary | ICD-10-CM | POA: Diagnosis not present

## 2021-05-27 DIAGNOSIS — Z882 Allergy status to sulfonamides status: Secondary | ICD-10-CM | POA: Diagnosis not present

## 2021-05-27 DIAGNOSIS — I517 Cardiomegaly: Secondary | ICD-10-CM | POA: Diagnosis not present

## 2021-05-27 DIAGNOSIS — R0602 Shortness of breath: Secondary | ICD-10-CM | POA: Diagnosis not present

## 2021-05-27 DIAGNOSIS — R600 Localized edema: Secondary | ICD-10-CM | POA: Insufficient documentation

## 2021-05-27 DIAGNOSIS — Z86718 Personal history of other venous thrombosis and embolism: Secondary | ICD-10-CM | POA: Diagnosis not present

## 2021-05-27 DIAGNOSIS — J449 Chronic obstructive pulmonary disease, unspecified: Secondary | ICD-10-CM | POA: Diagnosis not present

## 2021-05-27 DIAGNOSIS — J90 Pleural effusion, not elsewhere classified: Secondary | ICD-10-CM | POA: Diagnosis not present

## 2021-05-27 DIAGNOSIS — Z8679 Personal history of other diseases of the circulatory system: Secondary | ICD-10-CM | POA: Diagnosis not present

## 2021-05-27 DIAGNOSIS — J9811 Atelectasis: Secondary | ICD-10-CM | POA: Diagnosis not present

## 2021-05-27 DIAGNOSIS — S81802A Unspecified open wound, left lower leg, initial encounter: Secondary | ICD-10-CM | POA: Diagnosis not present

## 2021-05-27 DIAGNOSIS — R627 Adult failure to thrive: Secondary | ICD-10-CM | POA: Diagnosis not present

## 2021-05-27 DIAGNOSIS — Z8673 Personal history of transient ischemic attack (TIA), and cerebral infarction without residual deficits: Secondary | ICD-10-CM | POA: Diagnosis not present

## 2021-05-27 DIAGNOSIS — L089 Local infection of the skin and subcutaneous tissue, unspecified: Secondary | ICD-10-CM | POA: Diagnosis not present

## 2021-05-27 DIAGNOSIS — R0902 Hypoxemia: Secondary | ICD-10-CM | POA: Diagnosis not present

## 2021-05-27 DIAGNOSIS — N1831 Chronic kidney disease, stage 3a: Secondary | ICD-10-CM | POA: Diagnosis not present

## 2021-05-27 DIAGNOSIS — Z20822 Contact with and (suspected) exposure to covid-19: Secondary | ICD-10-CM | POA: Diagnosis not present

## 2021-05-27 DIAGNOSIS — E1151 Type 2 diabetes mellitus with diabetic peripheral angiopathy without gangrene: Secondary | ICD-10-CM | POA: Diagnosis not present

## 2021-05-27 DIAGNOSIS — R531 Weakness: Secondary | ICD-10-CM | POA: Diagnosis not present

## 2021-05-27 DIAGNOSIS — E876 Hypokalemia: Secondary | ICD-10-CM | POA: Diagnosis not present

## 2021-05-27 DIAGNOSIS — R609 Edema, unspecified: Secondary | ICD-10-CM | POA: Diagnosis not present

## 2021-05-27 DIAGNOSIS — E785 Hyperlipidemia, unspecified: Secondary | ICD-10-CM | POA: Diagnosis not present

## 2021-05-27 DIAGNOSIS — E1159 Type 2 diabetes mellitus with other circulatory complications: Secondary | ICD-10-CM | POA: Diagnosis not present

## 2021-05-27 DIAGNOSIS — R000 Tachycardia, unspecified: Secondary | ICD-10-CM | POA: Diagnosis not present

## 2021-05-27 DIAGNOSIS — Z7901 Long term (current) use of anticoagulants: Secondary | ICD-10-CM | POA: Diagnosis not present

## 2021-05-27 DIAGNOSIS — R52 Pain, unspecified: Secondary | ICD-10-CM | POA: Diagnosis not present

## 2021-05-27 DIAGNOSIS — I4891 Unspecified atrial fibrillation: Secondary | ICD-10-CM | POA: Diagnosis not present

## 2021-05-27 DIAGNOSIS — R001 Bradycardia, unspecified: Secondary | ICD-10-CM | POA: Diagnosis not present

## 2021-05-27 DIAGNOSIS — I10 Essential (primary) hypertension: Secondary | ICD-10-CM | POA: Diagnosis not present

## 2021-05-27 DIAGNOSIS — I5032 Chronic diastolic (congestive) heart failure: Secondary | ICD-10-CM | POA: Diagnosis not present

## 2021-05-27 DIAGNOSIS — I252 Old myocardial infarction: Secondary | ICD-10-CM | POA: Diagnosis not present

## 2021-05-27 DIAGNOSIS — Z888 Allergy status to other drugs, medicaments and biological substances status: Secondary | ICD-10-CM | POA: Diagnosis not present

## 2021-05-27 DIAGNOSIS — I2510 Atherosclerotic heart disease of native coronary artery without angina pectoris: Secondary | ICD-10-CM | POA: Diagnosis not present

## 2021-05-27 DIAGNOSIS — J431 Panlobular emphysema: Secondary | ICD-10-CM | POA: Diagnosis not present

## 2021-05-27 DIAGNOSIS — T148XXA Other injury of unspecified body region, initial encounter: Secondary | ICD-10-CM | POA: Diagnosis not present

## 2021-05-27 LAB — COMPREHENSIVE METABOLIC PANEL
ALT: 20 U/L (ref 0–53)
AST: 26 U/L (ref 0–37)
Albumin: 2.6 g/dL — ABNORMAL LOW (ref 3.5–5.2)
Alkaline Phosphatase: 100 U/L (ref 39–117)
BUN: 30 mg/dL — ABNORMAL HIGH (ref 6–23)
CO2: 34 mEq/L — ABNORMAL HIGH (ref 19–32)
Calcium: 8.6 mg/dL (ref 8.4–10.5)
Chloride: 94 mEq/L — ABNORMAL LOW (ref 96–112)
Creatinine, Ser: 1.04 mg/dL (ref 0.40–1.50)
GFR: 70.68 mL/min (ref >60.00)
Glucose, Bld: 118 mg/dL — ABNORMAL HIGH (ref 70–99)
Potassium: 3.2 mEq/L — ABNORMAL LOW (ref 3.5–5.1)
Sodium: 138 mEq/L (ref 135–145)
Total Bilirubin: 0.8 mg/dL (ref 0.2–1.2)
Total Protein: 7.4 g/dL (ref 6.0–8.3)

## 2021-05-27 NOTE — Telephone Encounter (Signed)
lft vm for pt to call ofc to sch STAT CT chest. thanks

## 2021-05-27 NOTE — Telephone Encounter (Signed)
Documentation only.

## 2021-05-27 NOTE — Telephone Encounter (Signed)
Attempted to call to coordinate scheduling of STAT CT chest. No answer.

## 2021-05-27 NOTE — Chronic Care Management (AMB) (Signed)
  Chronic Care Management   Note  05/27/2021 Name: YOBANI SCHERTZER MRN: [id number] DOB: 1946-12-01  JAYLAN HINOJOSA is a 75 y.o. year old male who is a primary care patient of Darrick Huntsman, Mar Daring, MD. DEMARION PONDEXTER is currently enrolled in care management services. An additional referral for LCSW and  RN CM  was placed.   Follow up plan: Unsuccessful telephone outreach attempt made. A HIPAA compliant phone message was left for the patient providing contact information and requesting a return call.  The care management team will reach out to the patient again over the next 2 days.  If patient returns call to provider office, please advise to call Embedded Care Management Care Guide Penne Lash  at [phone]  Penne Lash, RMA Care Guide, Embedded Care Coordination [facility]  [place], [place] [address] Direct Dial: [phone] [email or web address]: [email or web address]

## 2021-05-29 ENCOUNTER — Other Ambulatory Visit: Payer: Self-pay

## 2021-05-29 ENCOUNTER — Emergency Department: Payer: Medicare HMO

## 2021-05-29 ENCOUNTER — Inpatient Hospital Stay
Admission: EM | Admit: 2021-05-29 | Discharge: 2021-05-31 | DRG: 603 | Disposition: A | Discharge status: Home Health | Payer: Medicare HMO | Attending: Internal Medicine | Admitting: Internal Medicine

## 2021-05-29 DIAGNOSIS — Z7901 Long term (current) use of anticoagulants: Secondary | ICD-10-CM | POA: Diagnosis not present

## 2021-05-29 DIAGNOSIS — E876 Hypokalemia: Secondary | ICD-10-CM | POA: Diagnosis present

## 2021-05-29 DIAGNOSIS — I2510 Atherosclerotic heart disease of native coronary artery without angina pectoris: Secondary | ICD-10-CM | POA: Diagnosis present

## 2021-05-29 DIAGNOSIS — I10 Essential (primary) hypertension: Secondary | ICD-10-CM | POA: Diagnosis not present

## 2021-05-29 DIAGNOSIS — Z86718 Personal history of other venous thrombosis and embolism: Secondary | ICD-10-CM

## 2021-05-29 DIAGNOSIS — Z79899 Other long term (current) drug therapy: Secondary | ICD-10-CM | POA: Diagnosis not present

## 2021-05-29 DIAGNOSIS — J449 Chronic obstructive pulmonary disease, unspecified: Secondary | ICD-10-CM | POA: Diagnosis present

## 2021-05-29 DIAGNOSIS — L03116 Cellulitis of left lower limb: Principal | ICD-10-CM | POA: Diagnosis present

## 2021-05-29 DIAGNOSIS — R531 Weakness: Secondary | ICD-10-CM

## 2021-05-29 DIAGNOSIS — R627 Adult failure to thrive: Secondary | ICD-10-CM | POA: Diagnosis present

## 2021-05-29 DIAGNOSIS — Z87891 Personal history of nicotine dependence: Secondary | ICD-10-CM

## 2021-05-29 DIAGNOSIS — I5032 Chronic diastolic (congestive) heart failure: Secondary | ICD-10-CM | POA: Diagnosis present

## 2021-05-29 DIAGNOSIS — E1151 Type 2 diabetes mellitus with diabetic peripheral angiopathy without gangrene: Secondary | ICD-10-CM | POA: Diagnosis present

## 2021-05-29 DIAGNOSIS — E1159 Type 2 diabetes mellitus with other circulatory complications: Secondary | ICD-10-CM | POA: Diagnosis not present

## 2021-05-29 DIAGNOSIS — Z8673 Personal history of transient ischemic attack (TIA), and cerebral infarction without residual deficits: Secondary | ICD-10-CM

## 2021-05-29 DIAGNOSIS — E785 Hyperlipidemia, unspecified: Secondary | ICD-10-CM | POA: Diagnosis present

## 2021-05-29 DIAGNOSIS — T148XXA Other injury of unspecified body region, initial encounter: Secondary | ICD-10-CM

## 2021-05-29 DIAGNOSIS — J431 Panlobular emphysema: Secondary | ICD-10-CM | POA: Diagnosis not present

## 2021-05-29 DIAGNOSIS — I4891 Unspecified atrial fibrillation: Secondary | ICD-10-CM | POA: Diagnosis present

## 2021-05-29 DIAGNOSIS — I252 Old myocardial infarction: Secondary | ICD-10-CM | POA: Diagnosis not present

## 2021-05-29 DIAGNOSIS — Z20822 Contact with and (suspected) exposure to covid-19: Secondary | ICD-10-CM | POA: Diagnosis present

## 2021-05-29 DIAGNOSIS — I130 Hypertensive heart and chronic kidney disease with heart failure and stage 1 through stage 4 chronic kidney disease, or unspecified chronic kidney disease: Secondary | ICD-10-CM | POA: Diagnosis present

## 2021-05-29 DIAGNOSIS — I890 Lymphedema, not elsewhere classified: Secondary | ICD-10-CM | POA: Diagnosis present

## 2021-05-29 DIAGNOSIS — E1122 Type 2 diabetes mellitus with diabetic chronic kidney disease: Secondary | ICD-10-CM | POA: Diagnosis present

## 2021-05-29 DIAGNOSIS — G4733 Obstructive sleep apnea (adult) (pediatric): Secondary | ICD-10-CM | POA: Diagnosis present

## 2021-05-29 DIAGNOSIS — N183 Chronic kidney disease, stage 3 unspecified: Secondary | ICD-10-CM | POA: Diagnosis present

## 2021-05-29 DIAGNOSIS — Z6829 Body mass index (BMI) 29.0-29.9, adult: Secondary | ICD-10-CM

## 2021-05-29 DIAGNOSIS — L089 Local infection of the skin and subcutaneous tissue, unspecified: Secondary | ICD-10-CM

## 2021-05-29 DIAGNOSIS — I82409 Acute embolism and thrombosis of unspecified deep veins of unspecified lower extremity: Secondary | ICD-10-CM | POA: Diagnosis present

## 2021-05-29 DIAGNOSIS — I255 Ischemic cardiomyopathy: Secondary | ICD-10-CM | POA: Diagnosis present

## 2021-05-29 DIAGNOSIS — Z888 Allergy status to other drugs, medicaments and biological substances status: Secondary | ICD-10-CM | POA: Diagnosis not present

## 2021-05-29 DIAGNOSIS — N1831 Chronic kidney disease, stage 3a: Secondary | ICD-10-CM | POA: Diagnosis present

## 2021-05-29 DIAGNOSIS — Z882 Allergy status to sulfonamides status: Secondary | ICD-10-CM | POA: Diagnosis not present

## 2021-05-29 DIAGNOSIS — Z9981 Dependence on supplemental oxygen: Secondary | ICD-10-CM

## 2021-05-29 LAB — COMPREHENSIVE METABOLIC PANEL
ALT: 19 U/L (ref 0–44)
AST: 22 U/L (ref 15–41)
Albumin: 2.3 g/dL — ABNORMAL LOW (ref 3.5–5.0)
Alkaline Phosphatase: 82 U/L (ref 38–126)
Anion gap: 9 (ref 5–15)
BUN: 26 mg/dL — ABNORMAL HIGH (ref 8–23)
CO2: 34 mmol/L — ABNORMAL HIGH (ref 22–32)
Calcium: 9 mg/dL (ref 8.9–10.3)
Chloride: 94 mmol/L — ABNORMAL LOW (ref 98–111)
Creatinine, Ser: 1.32 mg/dL — ABNORMAL HIGH (ref 0.61–1.24)
GFR, Estimated: 57 mL/min — ABNORMAL LOW (ref >60)
Glucose, Bld: 147 mg/dL — ABNORMAL HIGH (ref 70–99)
Potassium: 3.4 mmol/L — ABNORMAL LOW (ref 3.5–5.1)
Sodium: 137 mmol/L (ref 135–145)
Total Bilirubin: 1.3 mg/dL — ABNORMAL HIGH (ref 0.3–1.2)
Total Protein: 8 g/dL (ref 6.5–8.1)

## 2021-05-29 LAB — C-REACTIVE PROTEIN: CRP: 14.2 mg/dL — ABNORMAL HIGH (ref <1.0)

## 2021-05-29 LAB — URINALYSIS, COMPLETE (UACMP) WITH MICROSCOPIC
Bacteria, UA: NONE SEEN
Bilirubin Urine: NEGATIVE
Glucose, UA: NEGATIVE mg/dL
Hgb urine dipstick: NEGATIVE
Ketones, ur: NEGATIVE mg/dL
Leukocytes,Ua: NEGATIVE
Nitrite: NEGATIVE
Protein, ur: NEGATIVE mg/dL
Specific Gravity, Urine: 1.013 (ref 1.005–1.030)
Squamous Epithelial / HPF: NONE SEEN (ref 0–5)
pH: 5 (ref 5.0–8.0)

## 2021-05-29 LAB — CBC WITH DIFFERENTIAL/PLATELET
Abs Immature Granulocytes: 0.04 10*3/uL (ref 0.00–0.07)
Basophils Absolute: 0.1 10*3/uL (ref 0.0–0.1)
Basophils Relative: 1 %
Eosinophils Absolute: 0.5 10*3/uL (ref 0.0–0.5)
Eosinophils Relative: 6 %
HCT: 34 % — ABNORMAL LOW (ref 39.0–52.0)
Hemoglobin: 10.5 g/dL — ABNORMAL LOW (ref 13.0–17.0)
Immature Granulocytes: 1 %
Lymphocytes Relative: 11 %
Lymphs Abs: 0.8 10*3/uL (ref 0.7–4.0)
MCH: 25.5 pg — ABNORMAL LOW (ref 26.0–34.0)
MCHC: 30.9 g/dL (ref 30.0–36.0)
MCV: 82.7 fL (ref 80.0–100.0)
Monocytes Absolute: 0.6 10*3/uL (ref 0.1–1.0)
Monocytes Relative: 7 %
Neutro Abs: 5.7 10*3/uL (ref 1.7–7.7)
Neutrophils Relative %: 74 %
Platelets: 209 10*3/uL (ref 150–400)
RBC: 4.11 MIL/uL — ABNORMAL LOW (ref 4.22–5.81)
RDW: 16.8 % — ABNORMAL HIGH (ref 11.5–15.5)
WBC: 7.7 10*3/uL (ref 4.0–10.5)
nRBC: 0 % (ref 0.0–0.2)

## 2021-05-29 LAB — RESP PANEL BY RT-PCR (FLU A&B, COVID) ARPGX2
Influenza A by PCR: NEGATIVE
Influenza B by PCR: NEGATIVE
SARS Coronavirus 2 by RT PCR: NEGATIVE

## 2021-05-29 LAB — TROPONIN I (HIGH SENSITIVITY)
Troponin I (High Sensitivity): 15 ng/L (ref <18)
Troponin I (High Sensitivity): 14 ng/L (ref <18)

## 2021-05-29 LAB — BRAIN NATRIURETIC PEPTIDE: B Natriuretic Peptide: 254.3 pg/mL — ABNORMAL HIGH (ref 0.0–100.0)

## 2021-05-29 LAB — MAGNESIUM: Magnesium: 1.5 mg/dL — ABNORMAL LOW (ref 1.7–2.4)

## 2021-05-29 LAB — SEDIMENTATION RATE: Sed Rate: 88 mm/hr — ABNORMAL HIGH (ref 0–20)

## 2021-05-29 MED ORDER — METHOCARBAMOL 500 MG PO TABS
500.0000 mg | ORAL_TABLET | Freq: Four times a day (QID) | ORAL | Status: DC | PRN
  Administered 2021-05-30: 500 mg via ORAL
  Filled 2021-05-29: qty 1

## 2021-05-29 MED ORDER — IPRATROPIUM-ALBUTEROL 0.5-2.5 (3) MG/3ML IN SOLN
3.0000 mL | Freq: Three times a day (TID) | RESPIRATORY_TRACT | Status: DC
  Administered 2021-05-30 – 2021-05-31 (×2): 3 mL via RESPIRATORY_TRACT
  Filled 2021-05-29 (×4): qty 3

## 2021-05-29 MED ORDER — HYDRALAZINE HCL 20 MG/ML IJ SOLN: 5.0000 mg | INTRAMUSCULAR | Status: DC | PRN

## 2021-05-29 MED ORDER — FLUTICASONE FUROATE-VILANTEROL 100-25 MCG/INH IN AEPB
1.0000 | INHALATION_SPRAY | Freq: Every day | RESPIRATORY_TRACT | Status: DC
  Administered 2021-05-30 – 2021-05-31 (×2): 1 via RESPIRATORY_TRACT
  Filled 2021-05-29: qty 28

## 2021-05-29 MED ORDER — TRAZODONE HCL 50 MG PO TABS: 50.0000 mg | ORAL_TABLET | Freq: Every evening | ORAL | Status: DC | PRN

## 2021-05-29 MED ORDER — IPRATROPIUM-ALBUTEROL 0.5-2.5 (3) MG/3ML IN SOLN
3.0000 mL | RESPIRATORY_TRACT | Status: DC
  Administered 2021-05-29: 3 mL via RESPIRATORY_TRACT
  Filled 2021-05-29: qty 3

## 2021-05-29 MED ORDER — RIVAROXABAN 20 MG PO TABS
20.0000 mg | ORAL_TABLET | Freq: Every day | ORAL | Status: DC
  Administered 2021-05-29 – 2021-05-30 (×2): 20 mg via ORAL
  Filled 2021-05-29 (×4): qty 1

## 2021-05-29 MED ORDER — LOSARTAN POTASSIUM 25 MG PO TABS
25.0000 mg | ORAL_TABLET | Freq: Every day | ORAL | Status: DC
  Filled 2021-05-29: qty 1

## 2021-05-29 MED ORDER — SIMVASTATIN 20 MG PO TABS
20.0000 mg | ORAL_TABLET | Freq: Every day | ORAL | Status: DC
  Administered 2021-05-29 – 2021-05-30 (×2): 20 mg via ORAL
  Filled 2021-05-29 (×2): qty 1

## 2021-05-29 MED ORDER — OXYCODONE-ACETAMINOPHEN 5-325 MG PO TABS
1.0000 | ORAL_TABLET | Freq: Once | ORAL | Status: AC
  Administered 2021-05-29: 1 via ORAL
  Filled 2021-05-29: qty 1

## 2021-05-29 MED ORDER — ALBUTEROL SULFATE (2.5 MG/3ML) 0.083% IN NEBU: 2.5000 mg | INHALATION_SOLUTION | RESPIRATORY_TRACT | Status: DC | PRN

## 2021-05-29 MED ORDER — SPIRONOLACTONE 25 MG PO TABS
50.0000 mg | ORAL_TABLET | Freq: Every day | ORAL | Status: DC
  Filled 2021-05-29: qty 2

## 2021-05-29 MED ORDER — MUPIROCIN 2 % EX OINT
1.0000 "application " | TOPICAL_OINTMENT | Freq: Two times a day (BID) | CUTANEOUS | Status: DC
  Administered 2021-05-29 – 2021-05-31 (×3): 1 via TOPICAL
  Filled 2021-05-29 (×2): qty 22

## 2021-05-29 MED ORDER — NITROGLYCERIN 0.4 MG SL SUBL: 0.4000 mg | SUBLINGUAL_TABLET | SUBLINGUAL | Status: DC | PRN

## 2021-05-29 MED ORDER — HYDROXYZINE HCL 50 MG/ML IM SOLN
25.0000 mg | Freq: Four times a day (QID) | INTRAMUSCULAR | Status: DC | PRN
Start: 2021-05-29 — End: 2021-05-31
  Filled 2021-05-29: qty 0.5

## 2021-05-29 MED ORDER — VANCOMYCIN HCL 1750 MG/350ML IV SOLN
1750.0000 mg | INTRAVENOUS | Status: DC
  Administered 2021-05-30: 1750 mg via INTRAVENOUS
  Filled 2021-05-29 (×2): qty 350

## 2021-05-29 MED ORDER — OXYCODONE-ACETAMINOPHEN 5-325 MG PO TABS: 1.0000 | ORAL_TABLET | ORAL | Status: DC | PRN

## 2021-05-29 MED ORDER — FUROSEMIDE 10 MG/ML IJ SOLN
80.0000 mg | Freq: Once | INTRAMUSCULAR | Status: AC
  Administered 2021-05-29: 80 mg via INTRAVENOUS
  Filled 2021-05-29: qty 8

## 2021-05-29 MED ORDER — ACETAMINOPHEN 325 MG PO TABS: 650.0000 mg | ORAL_TABLET | Freq: Four times a day (QID) | ORAL | Status: DC | PRN

## 2021-05-29 MED ORDER — OXYCODONE HCL 5 MG PO TABS
15.0000 mg | ORAL_TABLET | Freq: Four times a day (QID) | ORAL | Status: DC | PRN
  Administered 2021-05-29 – 2021-05-30 (×4): 15 mg via ORAL
  Filled 2021-05-29 (×4): qty 3

## 2021-05-29 MED ORDER — UMECLIDINIUM BROMIDE 62.5 MCG/INH IN AEPB
1.0000 | INHALATION_SPRAY | Freq: Every day | RESPIRATORY_TRACT | Status: DC
  Administered 2021-05-30 – 2021-05-31 (×2): 1 via RESPIRATORY_TRACT
  Filled 2021-05-29: qty 7

## 2021-05-29 MED ORDER — VANCOMYCIN HCL 2000 MG/400ML IV SOLN
2000.0000 mg | Freq: Once | INTRAVENOUS | Status: AC
  Administered 2021-05-29: 2000 mg via INTRAVENOUS
  Filled 2021-05-29 (×2): qty 400

## 2021-05-29 MED ORDER — POTASSIUM CHLORIDE CRYS ER 20 MEQ PO TBCR
40.0000 meq | EXTENDED_RELEASE_TABLET | Freq: Once | ORAL | Status: AC
  Administered 2021-05-29: 40 meq via ORAL
  Filled 2021-05-29: qty 2

## 2021-05-29 MED ORDER — FLUTICASONE-UMECLIDIN-VILANT 100-62.5-25 MCG/INH IN AEPB: 1.0000 | INHALATION_SPRAY | Freq: Every day | RESPIRATORY_TRACT | Status: DC

## 2021-05-29 MED ORDER — DM-GUAIFENESIN ER 30-600 MG PO TB12
1.0000 | ORAL_TABLET | Freq: Two times a day (BID) | ORAL | Status: DC | PRN
  Filled 2021-05-29: qty 1

## 2021-05-29 MED ORDER — SODIUM CHLORIDE 0.9 % IV SOLN: 1.0000 g | Freq: Once | INTRAVENOUS | Status: DC

## 2021-05-29 MED ORDER — SODIUM CHLORIDE 0.9 % IV SOLN
2.0000 g | INTRAVENOUS | Status: DC
  Administered 2021-05-29 – 2021-05-30 (×2): 2 g via INTRAVENOUS
  Filled 2021-05-29 (×3): qty 20

## 2021-05-29 MED ORDER — FUROSEMIDE 40 MG PO TABS
40.0000 mg | ORAL_TABLET | Freq: Two times a day (BID) | ORAL | Status: DC
  Filled 2021-05-29: qty 1
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
## 2021-05-29 NOTE — Consult Note (Signed)
Pharmacy Antibiotic Note  Victor Daniels is a 75 y.o. male admitted on 05/29/2021 with cellutlitis of LE and dog bit wound with infection in left leg. Pt presented with SOB. Pt is unable to care for self and had not been taking antibiotics (doxycycline and ciprofloxacin) at home. PMH includes CHF, COPD, CAD, diabetes, and lymphedema. Pharmacy has been consulted for vancomycin dosing. Pt is also on ceftriaxone.   Afebrile, WBC 7.7, Scr 1.32 (baseline ~ 1)  Plan: - Continue ceftriaxone 2 g IV q24h  - Give vancomycin 2 g IV x 1 loading dose, followed by 1750 mg q24h  - Estimated AUC 474.3, Cmin 11.4  - Used Scr 1.32 to calculate  - Monitor levels with 4th dose  - Monitor renal function   Height: 6' (182.9 cm) Weight: 104.3 kg (230 lb) IBW/kg (Calculated) : 77.6  Temp (24hrs), Avg:98.7 F (37.1 C), Min:98.7 F (37.1 C), Max:98.7 F (37.1 C)  Recent Labs  Lab 05/26/21 1150 05/29/21 0733  WBC 7.5 7.7  CREATININE 1.04 1.32*    Estimated Creatinine Clearance: 61.3 mL/min (A) (by C-G formula based on SCr of 1.32 mg/dL (H)).    Allergies  Allergen Reactions  . Clonidine Derivatives     Reaction unknown  . Sulfa Antibiotics     Reaction unknown    Antimicrobials this admission: 6/5 vancomycin >> 6/5 ceftriaxone >>  Microbiology results: 6/5 BCx: pending  Thank you for allowing pharmacy to be a part of this patient's care.  Reatha Armour, PharmD Pharmacy Resident  05/29/2021 12:46 PM

## 2021-05-29 NOTE — Progress Notes (Signed)
PT Cancellation Note  Patient Details Name: Victor Daniels MRN: [id number] DOB: 06-10-1946   Cancelled Treatment:    Reason Eval/Treat Not Completed: Other (comment) PT orders received, chart reviewed. Attempted to see pt for PT evaluation but nurse in room completing assessment & labs attempting to draw blood. Will re-attempt later as pt is available.  Aleda Grana, PT, DPT 05/29/21, 2:52 PM    Sandi Mariscal 05/29/2021, 2:52 PM
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
## 2021-05-29 NOTE — ED Notes (Signed)
X-ray at bedside

## 2021-05-29 NOTE — ED Notes (Signed)
Pt given warm blanket.

## 2021-05-29 NOTE — ED Triage Notes (Signed)
Pt arrives via EMS from home after he called out for [facility]- pt has been living at home alone since his wife died a couple weeks ago- pt cannot take care of himself- pt has a wound on his left leg that is infected and has antibiotics at home that he is not taking- VSS per EMS- both pt legs are swollen and red

## 2021-05-29 NOTE — Plan of Care (Signed)
  Problem: Education: Goal: Knowledge of General Education information will improve Description: Including pain rating scale, medication(s)/side effects and non-pharmacologic comfort measures Outcome: Progressing   Problem: Health Behavior/Discharge Planning: Goal: Ability to manage health-related needs will improve Outcome: Progressing   Problem: Clinical Measurements: Goal: Ability to maintain clinical measurements within normal limits will improve Outcome: Progressing Goal: Will remain free from infection Outcome: Progressing Goal: Diagnostic test results will improve Outcome: Progressing Goal: Respiratory complications will improve Outcome: Progressing Goal: Cardiovascular complication will be avoided Outcome: Progressing   Problem: Elimination: Goal: Will not experience complications related to bowel motility Outcome: Progressing Goal: Will not experience complications related to urinary retention Outcome: Progressing   Problem: Coping: Goal: Level of anxiety will decrease Outcome: Progressing   Problem: Pain Managment: Goal: General experience of comfort will improve Outcome: Progressing   Problem: Safety: Goal: Ability to remain free from injury will improve Outcome: Progressing   Problem: Clinical Measurements: Goal: Ability to avoid or minimize complications of infection will improve Outcome: Progressing   Problem: Skin Integrity: Goal: Risk for impaired skin integrity will decrease Outcome: Progressing   Problem: Skin Integrity: Goal: Skin integrity will improve Outcome: Progressing

## 2021-05-30 ENCOUNTER — Telehealth: Payer: Self-pay

## 2021-05-30 ENCOUNTER — Telehealth: Payer: Self-pay | Admitting: Internal Medicine

## 2021-05-30 DIAGNOSIS — E1159 Type 2 diabetes mellitus with other circulatory complications: Secondary | ICD-10-CM

## 2021-05-30 LAB — BASIC METABOLIC PANEL
Anion gap: 8 (ref 5–15)
BUN: 24 mg/dL — ABNORMAL HIGH (ref 8–23)
CO2: 33 mmol/L — ABNORMAL HIGH (ref 22–32)
Calcium: 8.2 mg/dL — ABNORMAL LOW (ref 8.9–10.3)
Chloride: 94 mmol/L — ABNORMAL LOW (ref 98–111)
Creatinine, Ser: 1.15 mg/dL (ref 0.61–1.24)
GFR, Estimated: >60 mL/min (ref >60)
Glucose, Bld: 100 mg/dL — ABNORMAL HIGH (ref 70–99)
Potassium: 3.4 mmol/L — ABNORMAL LOW (ref 3.5–5.1)
Sodium: 135 mmol/L (ref 135–145)

## 2021-05-30 LAB — CBC
HCT: 32.2 % — ABNORMAL LOW (ref 39.0–52.0)
Hemoglobin: 9.8 g/dL — ABNORMAL LOW (ref 13.0–17.0)
MCH: 25.3 pg — ABNORMAL LOW (ref 26.0–34.0)
MCHC: 30.4 g/dL (ref 30.0–36.0)
MCV: 83.2 fL (ref 80.0–100.0)
Platelets: 193 10*3/uL (ref 150–400)
RBC: 3.87 MIL/uL — ABNORMAL LOW (ref 4.22–5.81)
RDW: 16.9 % — ABNORMAL HIGH (ref 11.5–15.5)
WBC: 8.5 10*3/uL (ref 4.0–10.5)
nRBC: 0 % (ref 0.0–0.2)

## 2021-05-30 LAB — MAGNESIUM: Magnesium: 1.6 mg/dL — ABNORMAL LOW (ref 1.7–2.4)

## 2021-05-30 MED ORDER — COLLAGENASE 250 UNIT/GM EX OINT
TOPICAL_OINTMENT | Freq: Every day | CUTANEOUS | Status: DC
  Administered 2021-05-30: 1 via TOPICAL
  Filled 2021-05-30: qty 30

## 2021-05-30 MED ORDER — HYDROCERIN EX CREA
TOPICAL_CREAM | Freq: Every day | CUTANEOUS | Status: DC
  Filled 2021-05-30: qty 113

## 2021-05-30 MED ORDER — SODIUM CHLORIDE 0.9 % IV SOLN: INTRAVENOUS | Status: DC

## 2021-05-30 NOTE — Evaluation (Signed)
Physical Therapy Evaluation Patient Details Name: Victor Daniels MRN: [id number] DOB: 1946-01-25 Today's Date: 05/30/2021   History of Present Illness  Pt is a 75 y/o M who called EMS with c/o being unable to take care of himself, endorsing worsening SOB, BLE edema & pain. Pt also c/o feeling disoriented, cannot keep track of his medications & canot take them, nor can obtain food or get to medical appointments. Pt had a dog bite in LLE 1 month ago, which is infected. PMH: CHF, COPD on 2L home O2, CAD, DM, lymphadema, arthritis, CVA, heart attack, HLD, HTN, PAD, leg venous insufficiency  Clinical Impression  Pt seen for PT evaluation & pt agreeable to tx. Pt reports he was receiving assistance from multiple neighbors for cooking, cleaning, and transportation PTA but was mod I in the home with a rollator. Pt currently demonstrates bed mobility, transfers & gait with RW & supervision. Pt strongly wishes to return home vs STR, citing his friends/neighbors can continue to assist him. Will continue to follow pt acutely to progress endurance, gait & stair negotiation prior to d/c home. Recommend HHPT f/u upon d/c.   Pt does note he was on home O2 until 3-4 days ago when he took himself off of it; after gait SpO2 88% on room air but increased to 94%.     Follow Up Recommendations Home health PT;Supervision - Intermittent    Equipment Recommendations  None recommended by PT (pt reports he has a rollator & RW)    Recommendations for Other Services       Precautions / Restrictions Precautions Precautions: Fall Restrictions Weight Bearing Restrictions: No      Mobility  Bed Mobility Overal bed mobility: Modified Independent             General bed mobility comments: supine>sit with HOB flat    Transfers Overall transfer level: Needs assistance Equipment used: Rolling walker (2 wheeled) Transfers: Sit to/from Stand Sit to Stand: Supervision Stand pivot transfers: Min assist        General transfer comment: Pt unable to come into standing without Mod A.  Ambulation/Gait Ambulation/Gait assistance: Supervision Gait Distance (Feet): 170 Feet Assistive device: Rolling walker (2 wheeled) Gait Pattern/deviations: Decreased stride length Gait velocity: decreased      Stairs            Wheelchair Mobility    Modified Rankin (Stroke Patients Only)       Balance Overall balance assessment: Needs assistance Sitting-balance support: Feet supported;No upper extremity supported Sitting balance-Leahy Scale: Good     Standing balance support: Bilateral upper extremity supported Standing balance-Leahy Scale: Fair                               Pertinent Vitals/Pain Pain Assessment: 0-10 Pain Score: 9  Pain Location: L foot with weight bearing Pain Descriptors / Indicators: Sore Pain Intervention(s): Monitored during session    Home Living Family/patient expects to be discharged to:: Private residence Living Arrangements: Alone Available Help at Discharge: Friend(s);Available PRN/intermittently Type of Home: House Home Access: Stairs to enter Entrance Stairs-Rails: None Entrance Stairs-Number of Steps: 1 Home Layout: One level Home Equipment: Walker - 2 wheels;Cane - single point;Walker - 4 wheels      Prior Function Level of Independence: Needs assistance   Gait / Transfers Assistance Needed: Mod I with rollator with intermittent use of SPC.  ADL's / Homemaking Assistance Needed: Pt's wife died recently. She  used to assist him with LB dressing, cooking, cleaning. Neighbors are now helping with that. Pt is Mod I in toileting, feeding, grooming, bathing.  Comments: Pt does not drive, neighbors are now assisting with transportation.     Hand Dominance   Dominant Hand: Right    Extremity/Trunk Assessment   Upper Extremity Assessment Upper Extremity Assessment: Overall WFL for tasks assessed    Lower Extremity  Assessment Lower Extremity Assessment: Generalized weakness (erythema on BLE, BLE wounds dressed & wrapped by nursing staff prior to PT arrival.)       Communication   Communication: No difficulties  Cognition Arousal/Alertness: Awake/alert Behavior During Therapy: WFL for tasks assessed/performed Overall Cognitive Status: Within Functional Limits for tasks assessed                                       General Comments General comments (skin integrity, edema, etc.): pt on room air, SpO2 88% after gait, increases to 94%    Exercises General Exercises - Lower Extremity Long Arc Quad: AROM;Strengthening;Both;10 reps;Seated Hip Flexion/Marching: AROM;Strengthening;Both;10 reps;Standing (BUE support on RW)    Assessment/Plan    PT Assessment Patient needs continued PT services  PT Problem List Decreased strength;Decreased mobility;Decreased skin integrity;Cardiopulmonary status limiting activity;Decreased activity tolerance;Decreased balance       PT Treatment Interventions DME instruction;Therapeutic activities;Modalities;Gait training;Therapeutic exercise;Patient/family education;Stair training;Balance training;Functional mobility training;Neuromuscular re-education    PT Goals (Current goals can be found in the Care Plan section)  Acute Rehab PT Goals Patient Stated Goal: to go home PT Goal Formulation: With patient Time For Goal Achievement: 06/13/21 Potential to Achieve Goals: Good    Frequency Min 2X/week   Barriers to discharge Decreased caregiver support lives alone    Co-evaluation               AM-PAC PT "6 Clicks" Mobility  Outcome Measure Help needed turning from your back to your side while in a flat bed without using bedrails?: None Help needed moving from lying on your back to sitting on the side of a flat bed without using bedrails?: None Help needed moving to and from a bed to a chair (including a wheelchair)?: A Little Help needed  standing up from a chair using your arms (e.g., wheelchair or bedside chair)?: A Little Help needed to walk in hospital room?: A Little Help needed climbing 3-5 steps with a railing? : A Little 6 Click Score: 20    End of Session Equipment Utilized During Treatment: Gait belt Activity Tolerance: Patient tolerated treatment well Patient left: in chair;with call bell/phone within reach;with chair alarm set;with family/visitor present   PT Visit Diagnosis: Unsteadiness on feet (R26.81);Muscle weakness (generalized) (M62.81)    Time: 9242-6834 PT Time Calculation (min) (ACUTE ONLY): 25 min   Charges:   PT Evaluation $PT Eval Low Complexity: 1 Low PT Treatments $Therapeutic Activity: 8-22 mins        Aleda Grana, PT, DPT 05/30/21, 2:46 PM   Sandi Mariscal 05/30/2021, 2:44 PM

## 2021-05-30 NOTE — Telephone Encounter (Signed)
Pt is currently admitted at [facility].  

## 2021-05-30 NOTE — Telephone Encounter (Signed)
DO NOT ORDER CT

## 2021-05-30 NOTE — Evaluation (Signed)
Occupational Therapy Evaluation Patient Details Name: Victor Daniels MRN: [id number] DOB: 12-26-1945 Today's Date: 05/30/2021    History of Present Illness Victor Daniels is 75 year old male with past medical history of hypertension, chronic respiratory failure from COPD, diabetes mellitus, CAD with PAD and history of MI, diastolic CHF, atrial fibrillation and chronic lymphedema who sustained a dog bite 1 month prior which became infected.  He was seen and given Cipro and doxycycline on 6/2 but had not started taking it and presented to the emergency room on 6/5 with complaints of increased pain and found to have a cellulitis.   Clinical Impression   Victor Daniels presents with generalized weakness, impaired balance, b/l LE edema, and reduced endurance. He reports that his wife passed away from bladder cancer ~ 2-3 weeks ago. She did the driving, shopping, cooking, cleaning, as well as assisted the pt with LB dressing. He has been Mod I with toileting, bathing, eating. He reports 2 falls in the previous 6 months. Today he required Mod A to move sit<stand, Min A for transfers, total A for donning socks. Pt states unequivically that he will not go to a SNF. He reports that he has a group of neighbors who come by each day. He intends to hire his next-door neighbor to come by several hours every morning and evening, and reports that she is in agreement with this plan. He was agreeable to HHOT/PT, but stated that he did not want "strangers" in his house more than a few hours a week. He needs a BSC (reports finding it increasingly difficult to come into standing after toileting) and a shower chair.     Follow Up Recommendations  Home health OT;SNF    Equipment Recommendations  3 in 1 bedside commode;Tub/shower seat    Recommendations for Other Services       Precautions / Restrictions Precautions Precautions: Fall Restrictions Weight Bearing Restrictions: No      Mobility Bed  Mobility Overal bed mobility: Modified Independent             General bed mobility comments: increased time/effort    Transfers Overall transfer level: Needs assistance Equipment used: Rolling walker (2 wheeled) Transfers: Sit to/from [employer] Sit to Stand: Mod assist Stand pivot transfers: Min assist       General transfer comment: Pt unable to come into standing without Mod A.    Balance Overall balance assessment: Needs assistance Sitting-balance support: No upper extremity supported Sitting balance-Leahy Scale: Good     Standing balance support: Bilateral upper extremity supported Standing balance-Leahy Scale: Good                             ADL either performed or assessed with clinical judgement   ADL Overall ADL's : Needs assistance/impaired Eating/Feeding: Modified independent                   Lower Body Dressing: Total assistance Lower Body Dressing Details (indicate cue type and reason): donning/doffing socks. Pt is unable to reach feet.                     Vision Baseline Vision/History: Wears glasses Wears Glasses: At all times Patient Visual Report: No change from baseline       Perception     Praxis      Pertinent Vitals/Pain Pain Assessment: No/denies pain     Hand Dominance Right   Extremity/Trunk Assessment  Upper Extremity Assessment Upper Extremity Assessment: Overall WFL for tasks assessed   Lower Extremity Assessment Lower Extremity Assessment: Generalized weakness       Communication Communication Communication: No difficulties   Cognition Arousal/Alertness: Awake/alert Behavior During Therapy: WFL for tasks assessed/performed Overall Cognitive Status: Within Functional Limits for tasks assessed                                 General Comments: Oriented x 4, but does seem overwhelmed, befuddled. Unable to tell me when his wife died -- could only say it was "2  or 3 weeks ago."   General Comments  edema b/l LE, cuts and bruising on b/l UE    Exercises Other Exercises Other Exercises: educ re: DC plans, home needs, dressing techniques   Shoulder Instructions      Home Living Family/patient expects to be discharged to:: Private residence Living Arrangements: Alone Available Help at Discharge: Friend(s);Available PRN/intermittently Type of Home: House Home Access: Level entry     Home Layout: One level     Bathroom Shower/Tub: Producer, television/film/video: Standard     Home Equipment: Environmental consultant - 2 wheels;Cane - single point (rollator)          Prior Functioning/Environment Level of Independence: Needs assistance  Gait / Transfers Assistance Needed: uses RW, SPC, or rollator at home. Reports 2 falls in previous 6 months. ADL's / Homemaking Assistance Needed: Pt's wife died recently. She used to assist him with LB dressing, cooking, cleaning. Neighbors are now helping with that. Pt is Mod I in toileting, feeding, grooming, bathing.   Comments: Pt does not drive. His wife used to assist with transportation, medication mgmt, household tasks. Neighbors are now helping with that, since his wife died ~ 1 month ago.        OT Problem List: Decreased strength;Impaired balance (sitting and/or standing);Decreased safety awareness;Decreased activity tolerance;Decreased range of motion;Decreased knowledge of use of DME or AE;Increased edema      OT Treatment/Interventions: Self-care/ADL training;Therapeutic exercise;Balance training;DME and/or AE instruction;Therapeutic activities;Patient/family education    OT Goals(Current goals can be found in the care plan section) Acute Rehab OT Goals Patient Stated Goal: to go home OT Goal Formulation: With patient Time For Goal Achievement: 06/13/21 Potential to Achieve Goals: Good ADL Goals Pt Will Perform Grooming: with modified independence;standing Pt Will Perform Lower Body Dressing: with  min guard assist;sitting/lateral leans (using AE as needed) Pt Will Transfer to Toilet: with modified independence;stand pivot transfer  OT Frequency: Min 1X/week   Barriers to D/C: Decreased caregiver support  wife, who assisted her husband with dressing, driving, IADLs, died recently       Co-evaluation              AM-PAC OT "6 Clicks" Daily Activity     Outcome Measure Help from another person eating meals?: None Help from another person taking care of personal grooming?: A Little Help from another person toileting, which includes using toliet, bedpan, or urinal?: A Lot Help from another person bathing (including washing, rinsing, drying)?: A Lot Help from another person to put on and taking off regular upper body clothing?: A Little Help from another person to put on and taking off regular lower body clothing?: Total 6 Click Score: 15   End of Session Equipment Utilized During Treatment: Rolling walker  Activity Tolerance: Patient tolerated treatment well Patient left: in chair;with call bell/phone within reach;with  chair alarm set  OT Visit Diagnosis: Unsteadiness on feet (R26.81);Muscle weakness (generalized) (M62.81);History of falling (Z91.81);Other abnormalities of gait and mobility (R26.89)                Time: 9628-3662 OT Time Calculation (min): 39 min Charges:  OT General Charges $OT Visit: 1 Visit OT Evaluation $OT Eval Moderate Complexity: 1 Mod OT Treatments $Self Care/Home Management : 38-52 mins  Latina Craver, PhD, MS, OTR/L 05/30/21, 11:45 AM

## 2021-05-30 NOTE — Consult Note (Signed)
WOC Nurse wound consult note Consultation was completed by review of records, images and assistance from the bedside nurse/clinical staff.    Reason for Consult:LLE wound; reported to have a dog bite that got infected Patient at home alone not able to care for self. Not taking scheduled meds Venous stasis apparent in images reviewed  Wound type: trauma in the presence of venous stasis dx Pressure Injury POA: NA Measurement: see nursing flow sheets Wound bed:dry, 100% golden/yellow in color Drainage (amount, consistency, odor) none Periwound:scabbed areas on the LLE consistent with skin changes related to venous stasis dx.  Dressing procedure/placement/frequency: Apply enzymatic debridement ointment to the LLE wound, top with small saline moist gauze and foam dressing Apply Eucerin to the bilateral LEs (not the wound bed) Wrap bilateral LEs with kerlix and ACE wrap from the toes to the knee.  Change daily  Patient needs follow up in a wound care center of his choice at [place]  [facility], CNS, CWON-AP [phone]

## 2021-05-30 NOTE — Progress Notes (Signed)
PROGRESS NOTE  Victor Daniels:[id number] DOB: 04/08/1946 DOA: 05/29/2021 PCP: [name], MD  HPI/Recap of past 53 hours: 75 year old male with past medical history of hypertension, chronic respiratory failure from COPD on 2 L nasal cannula, diabetes mellitus, CAD with PAD and history of MI, diastolic CHF, atrial fibrillation and chronic lymphedema who sustained a dog bite 1 month prior which became infected.  He was seen and given Cipro and doxycycline on 6/2 but had not started taking it and presented to the emergency room on 6/5 with complaints of increased pain and found to have a cellulitis.  Admitted to the hospitalist service.  Patient feeling better.  Less pain although still present.  Seen by wound care.  Assessment/Plan: Principal Problem:   Cellulitis of left lower extremity: Did not meet criteria for sepsis and sepsis has been ruled out.  Blood cultures pending.  On vancomycin and Rocephin.  Appreciate wound care help. Active Problems:   COPD (chronic obstructive pulmonary disease) (HCC) with chronic respiratory failure: Stable, continue nebs, supplemental oxygen.  At baseline.    Hyperlipidemia: Continue statin.    Hypertension: Blood pressure slightly soft.  Will hold antihypertensives and start gentle IV fluids.    CAD (coronary artery disease): Stable.    Diabetes mellitus with circulatory complication [facility]): Diet controlled.  A1c at 6.1 in February.  Will check sugars 3 times daily.  If CBGs start to rise, will start sliding scale. .   CKD (chronic kidney disease), stage IIIa   Atrial fibrillation (HCC): Currently in normal sinus rhythm.  Paroxysmal.    DVT (deep venous thrombosis) (HCC)   Failure to thrive syndrome, adult   Wound infection   Chronic diastolic CHF (congestive heart failure) (HCC): BNP slightly elevated.  Given soft blood pressures likely from the one-time dose of 80 mg of IV Lasix that he received was dehydration, will hold on Lasix and  give gentle IV fluids.    Hypokalemia   Code Status: Full code  Family Communication: Patient lives alone.  His wife passed away 3 weeks ago.  Disposition Plan: Discharge when cellulitis is better controlled, blood pressure stable, likely in the next few days   Consultants:  None  Procedures:  None  Antimicrobials:  IV Rocephin & vancomycin 6/5-present  DVT prophylaxis: Xarelto  Level of care: Med-Surg   Objective: Vitals:   05/30/21 0741 05/30/21 0822  BP:  (!) 91/45  Pulse:  80  Resp:  20  Temp:  (!) 97.4 F (36.3 C)  SpO2: 93% 97%    Intake/Output Summary (Last 24 hours) at 05/30/2021 0956 Last data filed at 05/30/2021 0506 Gross per 24 hour  Intake 340 ml  Output 1150 ml  Net -810 ml   Filed Weights   05/29/21 0735 05/30/21 0500  Weight: 104.3 kg 98.8 kg   Body mass index is 29.54 kg/m.  Exam:   General: Alert and oriented x3, no acute distress  Cardiovascular: Regular rate and rhythm, S1-S2  Respiratory: Clear to auscultation bilaterally  Abdomen: Soft, nontender, nondistended, positive bowel sounds  Musculoskeletal: No clubbing or cyanosis, 2+ pitting edema bilaterally  Skin: Chronic venous stasis involving both legs from the knees down.  Both legs are dusky with the left leg noting evidence of some small puncture wounds with drainage.  Psychiatry: Appropriate, no evidence of psychoses  Neurology: No focal deficits   Data Reviewed: CBC: Recent Labs  Lab 05/26/21 1150 05/29/21 0733 05/30/21 0422  WBC 7.5 7.7 8.5  NEUTROABS 5.4  5.7  --   HGB 10.1* 10.5* 9.8*  HCT 32.3* 34.0* 32.2*  MCV 80.9 82.7 83.2  PLT 257.0 209 193   Basic Metabolic Panel: Recent Labs  Lab 05/26/21 1150 05/29/21 0733 05/30/21 0422  NA 138 137 135  K 3.2* 3.4* 3.4*  CL 94* 94* 94*  CO2 34* 34* 33*  GLUCOSE 118* 147* 100*  BUN 30* 26* 24*  CREATININE 1.04 1.32* 1.15  CALCIUM 8.6 9.0 8.2*  MG  --  1.5* 1.6*   GFR: Estimated Creatinine Clearance:  68.6 mL/min (by C-G formula based on SCr of 1.15 mg/dL). Liver Function Tests: Recent Labs  Lab 05/26/21 1150 05/29/21 0733  AST 26 22  ALT 20 19  ALKPHOS 100 82  BILITOT 0.8 1.3*  PROT 7.4 8.0  ALBUMIN 2.6* 2.3*   No results for input(s): LIPASE, AMYLASE in the last 168 hours. No results for input(s): AMMONIA in the last 168 hours. Coagulation Profile: No results for input(s): INR, PROTIME in the last 168 hours. Cardiac Enzymes: No results for input(s): CKTOTAL, CKMB, CKMBINDEX, TROPONINI in the last 168 hours. BNP (last 3 results) No results for input(s): PROBNP in the last 8760 hours. HbA1C: No results for input(s): HGBA1C in the last 72 hours. CBG: No results for input(s): GLUCAP in the last 168 hours. Lipid Profile: No results for input(s): CHOL, HDL, LDLCALC, TRIG, CHOLHDL, LDLDIRECT in the last 72 hours. Thyroid Function Tests: No results for input(s): TSH, T4TOTAL, FREET4, T3FREE, THYROIDAB in the last 72 hours. Anemia Panel: No results for input(s): VITAMINB12, FOLATE, FERRITIN, TIBC, IRON, RETICCTPCT in the last 72 hours. Urine analysis:    Component Value Date/Time   COLORURINE YELLOW (A) 05/29/2021 0957   APPEARANCEUR CLEAR (A) 05/29/2021 0957   APPEARANCEUR Clear 07/09/2020 1513   LABSPEC 1.013 05/29/2021 0957   LABSPEC 1.013 04/02/2015 1556   PHURINE 5.0 05/29/2021 0957   GLUCOSEU NEGATIVE 05/29/2021 0957   GLUCOSEU Negative 04/02/2015 1556   GLUCOSEU NEGATIVE 08/20/2014 0907   HGBUR NEGATIVE 05/29/2021 0957   BILIRUBINUR NEGATIVE 05/29/2021 0957   BILIRUBINUR Negative 07/09/2020 1513   BILIRUBINUR Negative 04/02/2015 1556   KETONESUR NEGATIVE 05/29/2021 0957   PROTEINUR NEGATIVE 05/29/2021 0957   UROBILINOGEN 0.2 08/20/2014 0926   UROBILINOGEN 0.2 08/20/2014 0907   NITRITE NEGATIVE 05/29/2021 0957   LEUKOCYTESUR NEGATIVE 05/29/2021 0957   LEUKOCYTESUR Negative 04/02/2015 1556   Sepsis Labs: @LABRCNTIP (procalcitonin:4,lacticidven:4)  ) Recent  Results (from the past 240 hour(s))  Resp Panel by RT-PCR (Flu A&B, Covid) Nasopharyngeal Swab     Status: None   Collection Time: 05/29/21  8:12 AM   Specimen: Nasopharyngeal Swab; Nasopharyngeal(NP) swabs in vial transport medium  Result Value Ref Range Status   SARS Coronavirus 2 by RT PCR NEGATIVE NEGATIVE Final    Comment: (NOTE) SARS-CoV-2 target nucleic acids are NOT DETECTED.  The SARS-CoV-2 RNA is generally detectable in upper respiratory specimens during the acute phase of infection. The lowest concentration of SARS-CoV-2 viral copies this assay can detect is 138 copies/mL. A negative result does not preclude SARS-Cov-2 infection and should not be used as the sole basis for treatment or other patient management decisions. A negative result may occur with  improper specimen collection/handling, submission of specimen other than nasopharyngeal swab, presence of viral mutation(s) within the areas targeted by this assay, and inadequate number of viral copies(<138 copies/mL). A negative result must be combined with clinical observations, patient history, and epidemiological information. The expected result is Negative.  Fact Sheet for Patients:  BloggerCourse.comhttps://www.fda.gov/media/152166/download  Fact Sheet for Healthcare Providers:  SeriousBroker.ithttps://www.fda.gov/media/152162/download  This test is no t yet approved or cleared by the [place] FDA and  has been authorized for detection and/or diagnosis of SARS-CoV-2 by FDA under an Emergency Use Authorization (EUA). This EUA will remain  in effect (meaning this test can be used) for the duration of the COVID-19 declaration under Section 564(b)(1) of the Act, 21 U.S.C.section 360bbb-3(b)(1), unless the authorization is terminated  or revoked sooner.       Influenza A by PCR NEGATIVE NEGATIVE Final   Influenza B by PCR NEGATIVE NEGATIVE Final    Comment: (NOTE) The Xpert Xpress SARS-CoV-2/FLU/RSV plus assay is intended as an aid in the  diagnosis of influenza from Nasopharyngeal swab specimens and should not be used as a sole basis for treatment. Nasal washings and aspirates are unacceptable for Xpert Xpress SARS-CoV-2/FLU/RSV testing.  Fact Sheet for Patients: BloggerCourse.comhttps://www.fda.gov/media/152166/download  Fact Sheet for Healthcare Providers: SeriousBroker.ithttps://www.fda.gov/media/152162/download  This test is not yet approved or cleared by the [place] FDA and has been authorized for detection and/or diagnosis of SARS-CoV-2 by FDA under an Emergency Use Authorization (EUA). This EUA will remain in effect (meaning this test can be used) for the duration of the COVID-19 declaration under Section 564(b)(1) of the Act, 21 U.S.C. section 360bbb-3(b)(1), unless the authorization is terminated or revoked.  Performed at [facility], [address] [place], [place] [address]   Culture, blood (Routine X 2) w Reflex to ID Panel     Status: None (Preliminary result)   Collection Time: 05/29/21  3:02 PM   Specimen: BLOOD  Result Value Ref Range Status   Specimen Description BLOOD  LEFT AC  Final   Special Requests   Final    BOTTLES DRAWN AEROBIC AND ANAEROBIC Blood Culture results may not be optimal due to an excessive volume of blood received in culture bottles   Culture   Final    NO GROWTH < 24 HOURS Performed at [facility], [address] [place], [place] [address]    Report Status PENDING  Incomplete  Culture, blood (Routine X 2) w Reflex to ID Panel     Status: None (Preliminary result)   Collection Time: 05/29/21  5:00 PM   Specimen: BLOOD  Result Value Ref Range Status   Specimen Description BLOOD LEFT THUMB  Final   Special Requests   Final    BOTTLES DRAWN AEROBIC AND ANAEROBIC Blood Culture results may not be optimal due to an inadequate volume of blood received in culture bottles   Culture   Final    NO GROWTH < 24 HOURS Performed at [facility], [address] [place],  [place] [address]    Report Status PENDING  Incomplete      Studies: No results found.  Scheduled Meds: . collagenase   Topical Daily  . fluticasone furoate-vilanterol  1 puff Inhalation Daily   And  . umeclidinium bromide  1 puff Inhalation Daily  . furosemide  40 mg Oral BID  . hydrocerin   Topical Daily  . ipratropium-albuterol  3 mL Nebulization TID  . losartan  25 mg Oral Daily  . mupirocin ointment  1 application Topical BID  . rivaroxaban  20 mg Oral Q supper  . simvastatin  20 mg Oral QHS  . spironolactone  50 mg Oral Daily    Continuous Infusions: . cefTRIAXone (ROCEPHIN)  IV 2 g (05/29/21 1325)  . vancomycin  LOS: 1 day     Hollice Espy, MD [facility]   05/30/2021, 9:56 AM

## 2021-05-30 NOTE — Telephone Encounter (Signed)
He admitted himself into the hospital.

## 2021-05-30 NOTE — Telephone Encounter (Signed)
I called pt to sch stat CT chest, per pt he admitted himself in 2 days ago, pt states he is feeling better. Is the Ct chest still needed for when pt gets out? Please advise and thank you!

## 2021-05-30 NOTE — Telephone Encounter (Signed)
Mark from [employer] called stating that Patient requested a script for 120 Tablets of oxycodone. They need authorization to refill the medication.

## 2021-05-30 NOTE — Telephone Encounter (Signed)
Hi saw this patient in clinic last week also elevated D dimer and BNP  I wanted to do CTA chest to f/u from prior in 2019 and echo  Can you all coordinate this please?

## 2021-05-31 ENCOUNTER — Telehealth: Payer: Self-pay

## 2021-05-31 DIAGNOSIS — R531 Weakness: Secondary | ICD-10-CM

## 2021-05-31 LAB — BASIC METABOLIC PANEL
Anion gap: 7 (ref 5–15)
BUN: 20 mg/dL (ref 8–23)
CO2: 30 mmol/L (ref 22–32)
Calcium: 8.1 mg/dL — ABNORMAL LOW (ref 8.9–10.3)
Chloride: 94 mmol/L — ABNORMAL LOW (ref 98–111)
Creatinine, Ser: 1.04 mg/dL (ref 0.61–1.24)
GFR, Estimated: >60 mL/min (ref >60)
Glucose, Bld: 106 mg/dL — ABNORMAL HIGH (ref 70–99)
Potassium: 3.4 mmol/L — ABNORMAL LOW (ref 3.5–5.1)
Sodium: 131 mmol/L — ABNORMAL LOW (ref 135–145)

## 2021-05-31 LAB — CBC
HCT: 32.2 % — ABNORMAL LOW (ref 39.0–52.0)
Hemoglobin: 9.9 g/dL — ABNORMAL LOW (ref 13.0–17.0)
MCH: 25.4 pg — ABNORMAL LOW (ref 26.0–34.0)
MCHC: 30.7 g/dL (ref 30.0–36.0)
MCV: 82.6 fL (ref 80.0–100.0)
Platelets: 195 10*3/uL (ref 150–400)
RBC: 3.9 MIL/uL — ABNORMAL LOW (ref 4.22–5.81)
RDW: 17 % — ABNORMAL HIGH (ref 11.5–15.5)
WBC: 7.8 10*3/uL (ref 4.0–10.5)
nRBC: 0 % (ref 0.0–0.2)

## 2021-05-31 LAB — MAGNESIUM: Magnesium: 1.5 mg/dL — ABNORMAL LOW (ref 1.7–2.4)

## 2021-05-31 MED ORDER — MAGNESIUM SULFATE 2 GM/50ML IV SOLN
2.0000 g | INTRAVENOUS | Status: AC
  Administered 2021-05-31: 2 g via INTRAVENOUS
  Filled 2021-05-31: qty 50

## 2021-05-31 MED ORDER — POTASSIUM CHLORIDE CRYS ER 20 MEQ PO TBCR
40.0000 meq | EXTENDED_RELEASE_TABLET | Freq: Once | ORAL | Status: AC
  Administered 2021-05-31: 40 meq via ORAL
  Filled 2021-05-31: qty 2

## 2021-05-31 NOTE — Progress Notes (Signed)
Discharge Note: Charge nurse Reviewed discharge instructions with pt.  Pt verbalized understanding.  PT discharge to home with personal belongings. IV cath intact upon removal. Obtained vitals. Staff wheeled pt out.  PT transported to home via family/friend transportation.

## 2021-05-31 NOTE — Telephone Encounter (Signed)
Transition Care Management Unsuccessful Follow-up Telephone Call  Date of discharge and from where:  05/31/21 from [facility]  Attempts:  1st Attempt  Reason for unsuccessful TCM follow-up call:  Unable to reach patient  Hospital follow up scheduled 6/9. Will follow.

## 2021-05-31 NOTE — TOC Progression Note (Signed)
Transition of Care [facility]) - Progression Note    Patient Details  Name: Victor Daniels MRN: [id number] Date of Birth: 08-22-1946  [facility] of [facility]) CM/SW Contact  Barrie Dunker, RN Phone Number: 05/31/2021, 11:01 AM  Clinical Narrative:   With the patient's permission TOC CM called Victor Daniels the neighbor and friend, Victor Daniels stated that he and the other neighbor Victor Daniels will be helping the patient daily, I explained that he has dressing changes on his legs that need to be done daily, Victor Daniels stated that he and Victor Daniels will be helping as well as some other friends and neighbors.     Expected Discharge Plan: Home w Home Health Services Barriers to Discharge: Barriers Resolved  Expected Discharge Plan and Services Expected Discharge Plan: Home w Home Health Services   Discharge Planning Services: CM Consult   Living arrangements for the past 2 months: Single Family Home Expected Discharge Date: 05/31/21               DME Arranged: N/A         HH Arranged: PT,RN HH [facility]: [facility] Date HH Agency Contacted: 05/31/21 Time HH Agency Contacted: 1017 Representative spoke with at [facility] Agency: [place]   Social Determinants of Health (SDOH) Interventions    Readmission Risk Interventions No flowsheet data found.

## 2021-05-31 NOTE — Discharge Instructions (Signed)

## 2021-05-31 NOTE — Progress Notes (Signed)
Met with the patient to discuss DC Plan and needs He lives alone but has friends that help Vickii Chafe and Vicente Serene provide transportation, \He has a RW BSC and cane at home and does not need additional DME He can afford his medications He stated that he will arrange friend to learn wound care to do it when [facility] can't come, it is to be done daily Spoke with [place] [facility] [place] and arranged for [facility] They accepted the patient He is to DC today

## 2021-06-01 ENCOUNTER — Telehealth: Payer: Self-pay | Admitting: *Deleted

## 2021-06-01 NOTE — Telephone Encounter (Signed)
Transition Care Management Unsuccessful Follow-up Telephone Call  Date of discharge and from where:  05/31/21 from [facility]  Attempts:  2nd Attempt  Reason for unsuccessful TCM follow-up call:  Voice mail full. Hospital follow up appointment scheduled with Dr. Judie Grieve 06/02/21 at 10:00.

## 2021-06-01 NOTE — Telephone Encounter (Signed)
  Care Management   Follow Up Note   06/01/2021 Name: Victor Daniels MRN: [id number] DOB: [birth year]   Referred by: [name], MD Reason for referral : Care Coordination   An unsuccessful telephone outreach was attempted today. The patient was referred to the case management team for assistance with care management and care coordination.   The mail-box was full and no message was able to be left.  Follow Up Plan: The care management team will reach out to the patient again over the next 3-5 business days days.   Adriana Reams [facility] Care Management [phone]

## 2021-06-02 ENCOUNTER — Inpatient Hospital Stay: Payer: Medicare HMO | Admitting: Internal Medicine

## 2021-06-02 ENCOUNTER — Encounter: Payer: Self-pay | Admitting: *Deleted

## 2021-06-02 ENCOUNTER — Telehealth: Payer: Self-pay | Admitting: *Deleted

## 2021-06-02 ENCOUNTER — Ambulatory Visit: Payer: Medicare HMO | Admitting: Physician Assistant

## 2021-06-02 NOTE — Chronic Care Management (AMB) (Signed)
  Chronic Care Management   Note  06/02/2021 Name: HENRY UTSEY MRN: [id number] DOB: 07-03-1946  NICKALOUS STINGLEY is a 75 y.o. year old male who is a primary care patient of Darrick Huntsman, Mar Daring, MD. QUENTEN NAWAZ is currently enrolled in care management services. An additional referral for Licensed Clinical SW was placed.   Follow up plan: Patient declines engagement by the LCSW and RN CM. Appropriate care team members and provider have been notified via electronic communication.   Penne Lash, RMA Care Guide, Embedded Care Coordination [facility]  [place], [place] [address] Direct Dial: [phone] [email or web address]

## 2021-06-02 NOTE — Patient Outreach (Signed)
[facility]) Care Management  06/02/2021  Victor Daniels 18-Mar-1946 [id number]   Patient referred to this social worker to address need for grief counseling. Patient contacted today to discuss community resource/mental health needs. Patient verbalized having no needs at this time and declined mental health resources for grief counseling. Patient encouraged to call this social worker back with any further community resource needs that may arise in the future.  Adriana Reams [facility] Care Management [phone]

## 2021-06-02 NOTE — Discharge Summary (Signed)
[facility]   PATIENT NAME: Victor Daniels    MR#:  [id number]  DATE OF BIRTH:  Aug 27, 1946  DATE OF ADMISSION:  05/29/2021   ADMITTING PHYSICIAN: Lorretta Harp, MD  DATE OF DISCHARGE: 05/31/2021 12:54 PM  PRIMARY CARE PHYSICIAN: [name], MD   ADMISSION DIAGNOSIS:  Cellulitis of left lower extremity [L03.116] Generalized weakness [R53.1] DISCHARGE DIAGNOSIS:  Principal Problem:   Cellulitis of left lower extremity Active Problems:   COPD (chronic obstructive pulmonary disease) (HCC)   Hyperlipidemia   Hypertension   CAD (coronary artery disease)   Generalized weakness   Diabetes mellitus with circulatory complication (HCC)   CKD (chronic kidney disease), stage IIIa   Atrial fibrillation (HCC)   DVT (deep venous thrombosis) (HCC)   Failure to thrive syndrome, adult   Wound infection   Chronic diastolic CHF (congestive heart failure) (HCC)   Hypokalemia  SECONDARY DIAGNOSIS:   Past Medical History:  Diagnosis Date  . Alcoholic gastritis   . Arthritis   . CAD (coronary artery disease)   . CHF (congestive heart failure) (HCC)    ischemic CM.  EF 25%  . COPD (chronic obstructive pulmonary disease) (HCC)   . COPD with acute exacerbation (HCC) 03/05/2013  . CVA (cerebral infarction)    residual short term memory loss  . Diabetes mellitus without complication (HCC)    diet controlled  . DVT (deep venous thrombosis) (HCC)   . Heart attack (HCC) 11/16/09  . Hematospermia 10/23/2012  . History of alcohol abuse 2005   now abstinent for years  . Hyperlipidemia   . Hypertension   . On home oxygen therapy    2 liters continuously  . OSA (obstructive sleep apnea)    not on CPAP  . PAD (peripheral artery disease) (HCC)   . Pneumonia    frequent in the past  . Postoperative state 09/07/2014   Overview:  Last Assessment & Plan:  He has normal renal function.   . Stroke (HCC) 2010  . Stroke (HCC)   . Venous insufficiency of leg     HOSPITAL COURSE:  75 year old male with past medical history of hypertension, chronic respiratory failure from COPD on 2 L nasal cannula, diabetes mellitus, CAD with PAD and history of MI, diastolic CHF, atrial fibrillation and chronic lymphedema who sustained a dog bite 1 month prior which became infected.  He was seen and given Cipro and doxycycline on 6/2 but had not started taking it and presented to the emergency room on 6/5 with complaints of increased pain and found to have a cellulitis.       Cellulitis of left lower extremity: Did not meet criteria for sepsis and sepsis has been ruled out. Improved with Abx and going home on PO Abx - he says he already has it at home  COPD (chronic obstructive pulmonary disease) (HCC) with chronic respiratory failure: Stable, supplemental oxygen.  At baseline.    Hyperlipidemia: Continue statin.    Hypertension: controlled    CAD (coronary artery disease): Stable.    Diabetes mellitus with circulatory complication [facility]): Diet controlled.  A1c at 6.1 in February.  .   CKD (chronic kidney disease), stage IIIa - stable   Atrial fibrillation (HCC): Currently in normal sinus rhythm.  Paroxysmal.    DVT (deep venous thrombosis) (HCC)   Failure to thrive syndrome, adult   Wound infection   Chronic diastolic CHF (congestive heart failure) (HCC): well compensated at this time    Hypokalemia: repleted  and resolved  He has difficult social situation at home but he is adamant in wanting to leave. I've requested TOC to help. Please see their note for further details. Patient is not willing to stay any further in the hospital. I d/w his PCP as well.  DISCHARGE CONDITIONS:  stable CONSULTS OBTAINED:   DRUG ALLERGIES:   Allergies  Allergen Reactions  . Clonidine Derivatives     Reaction unknown  . Sulfa Antibiotics     Reaction unknown   DISCHARGE MEDICATIONS:   Allergies as of 05/31/2021       Reactions   Clonidine Derivatives    Reaction unknown    Sulfa Antibiotics    Reaction unknown        Medication List     STOP taking these medications    OVER THE COUNTER MEDICATION       TAKE these medications    albuterol (2.5 MG/3ML) 0.083% nebulizer solution Commonly known as: PROVENTIL Take 3 mLs (2.5 mg total) by nebulization every 6 (six) hours as needed for wheezing or shortness of breath.   Ventolin HFA 108 (90 Base) MCG/ACT inhaler Generic drug: albuterol INHALE 2 PUFFS BY MOUTH EVERY 6 HOURS AS NEEDED FOR WHEEZING OR SHORTNESS OF BREATH   ciprofloxacin 500 MG tablet Commonly known as: Cipro Take 1 tablet (500 mg total) by mouth 2 (two) times daily for 7 days. With food   doxycycline 100 MG tablet Commonly known as: VIBRA-TABS Take 1 tablet (100 mg total) by mouth 2 (two) times daily. With food   furosemide 40 MG tablet Commonly known as: LASIX Take 1 tablet (40 mg total) by mouth 2 (two) times daily.   ipratropium 0.03 % nasal spray Commonly known as: ATROVENT Place into both nostrils 2 (two) times daily as needed.   ipratropium-albuterol 0.5-2.5 (3) MG/3ML Soln Commonly known as: DUONEB USE 1 VIAL IN NEBULIZER EVERY 6 HOURS AS NEEDED FOR WHEEZING   losartan 25 MG tablet Commonly known as: COZAAR Take 1 tablet (25 mg total) by mouth daily.   methocarbamol 500 MG tablet Commonly known as: ROBAXIN Take 500 mg by mouth every 6 (six) hours as needed.   mupirocin ointment 2 % Commonly known as: BACTROBAN Apply 1 application topically 2 (two) times daily.   nitroGLYCERIN 0.4 MG SL tablet Commonly known as: NITROSTAT Place 1 tablet (0.4 mg total) under the tongue every 5 (five) minutes as needed for chest pain. Maximum dose 3 tablets   oxyCODONE 15 MG immediate release tablet Commonly known as: ROXICODONE Take 1 tablet (15 mg total) by mouth every 6 (six) hours as needed. Start taking on: July 25, 2021 What changed: Another medication with the same name was removed. Continue taking this medication,  and follow the directions you see here.   OXYGEN Inhale 2 L into the lungs 3 (three) times daily as needed (shortness of breath).   rivaroxaban 20 MG Tabs tablet Commonly known as: XARELTO Take 20 mg by mouth daily with supper.   simvastatin 20 MG tablet Commonly known as: ZOCOR Take 1 tablet (20 mg total) by mouth at bedtime.   spironolactone 50 MG tablet Commonly known as: Aldactone Take 1 tablet (50 mg total) by mouth daily. In am   traZODone 50 MG tablet Commonly known as: DESYREL Take 1 tablet (50 mg total) by mouth at bedtime.   Trelegy Ellipta 100-62.5-25 MCG/INH Aepb Generic drug: Fluticasone-Umeclidin-Vilant Inhale 1 puff into the lungs daily.   vitamin B-12 1000 MCG tablet Commonly known  as: CYANOCOBALAMIN Take 1,000 mcg by mouth daily.               Discharge Care Instructions  (From admission, onward)           Start     Ordered   05/31/21 0000  Discharge wound care:       Comments: As above   05/31/21 0938           DISCHARGE INSTRUCTIONS:   DIET:  Cardiac diet DISCHARGE CONDITION:  Stable ACTIVITY:  Activity as tolerated OXYGEN:  Home Oxygen: No.  Oxygen Delivery: room air DISCHARGE LOCATION:  Home with HH   If you experience worsening of your admission symptoms, develop shortness of breath, life threatening emergency, suicidal or homicidal thoughts you must seek medical attention immediately by calling 911 or calling your MD immediately  if symptoms less severe.  You Must read complete instructions/literature along with all the possible adverse reactions/side effects for all the Medicines you take and that have been prescribed to you. Take any new Medicines after you have completely understood and accpet all the possible adverse reactions/side effects.   Please note  You were cared for by a hospitalist during your hospital stay. If you have any questions about your discharge medications or the care you received while you were in  the hospital after you are discharged, you can call the unit and asked to speak with the hospitalist on call if the hospitalist that took care of you is not available. Once you are discharged, your primary care physician will handle any further medical issues. Please note that NO REFILLS for any discharge medications will be authorized once you are discharged, as it is imperative that you return to your primary care physician (or establish a relationship with a primary care physician if you do not have one) for your aftercare needs so that they can reassess your need for medications and monitor your lab values.    On the day of Discharge:  VITAL SIGNS:  Blood pressure (!) 145/88, pulse 97, temperature 99.2 F (37.3 C), temperature source Oral, resp. rate 18, height 6' (1.829 m), weight 98.8 kg, SpO2 97 %. PHYSICAL EXAMINATION:  GENERAL:  75 y.o.-year-old patient lying in the bed with no acute distress.  EYES: Pupils equal, round, reactive to light and accommodation. No scleral icterus. Extraocular muscles intact.  HEENT: Head atraumatic, normocephalic. Oropharynx and nasopharynx clear.  NECK:  Supple, no jugular venous distention. No thyroid enlargement, no tenderness.  LUNGS: Normal breath sounds bilaterally, no wheezing, rales,rhonchi or crepitation. No use of accessory muscles of respiration.  CARDIOVASCULAR: S1, S2 normal. No murmurs, rubs, or gallops.  ABDOMEN: Soft, non-tender, non-distended. Bowel sounds present. No organomegaly or mass.  EXTREMITIES: No pedal edema, cyanosis, or clubbing.  NEUROLOGIC: Cranial nerves II through XII are intact. Muscle strength 5/5 in all extremities. Sensation intact. Gait not checked.  PSYCHIATRIC: The patient is alert and oriented x 3.  SKIN: No obvious rash, lesion, or ulcer.  DATA REVIEW:   CBC Recent Labs  Lab 05/31/21 0437  WBC 7.8  HGB 9.9*  HCT 32.2*  PLT 195    Chemistries  Recent Labs  Lab 05/29/21 0733 05/30/21 0422 05/31/21 0437   NA 137   < > 131*  K 3.4*   < > 3.4*  CL 94*   < > 94*  CO2 34*   < > 30  GLUCOSE 147*   < > 106*  BUN 26*   < >  20  CREATININE 1.32*   < > 1.04  CALCIUM 9.0   < > 8.1*  MG 1.5*   < > 1.5*  AST 22  --   --   ALT 19  --   --   ALKPHOS 82  --   --   BILITOT 1.3*  --   --    < > = values in this interval not displayed.     Outpatient follow-up  Follow-up Information     [name], MD. Go on 06/02/2021.   Specialty: Internal Medicine Why: Appt w/ Dr.Tracy McLean-Scoccuza;@ 10:00 am Contact information: [address] [place] [address] [phone]         [name], MD. Go on 06/10/2021.   Specialty: Cardiology Why: w/ Glynn Octave, PA-C;  @ 11:00 am Contact information: [address] [place] [address] [phone]                 30 Day Unplanned Readmission Risk Score    Flowsheet Row ED to Hosp-Admission (Discharged) from 05/29/2021 in [facility] (1A)  30 Day Unplanned Readmission Risk Score (%) 20.54 Filed at 05/31/2021 1200       This score is the patient's risk of an unplanned readmission within 30 days of being discharged (0 -100%). The score is based on dignosis, age, lab data, medications, orders, and past utilization.   Low:  0-14.9   Medium: 15-21.9   High: 22-29.9   Extreme: 30 and above           Management plans discussed with the patient, nursing, PCP and they are in agreement.  CODE STATUS: Prior   TOTAL TIME TAKING CARE OF THIS PATIENT: 45 minutes.    Delfino Lovett M.D on 06/02/2021 at 8:22 PM  [facility]   CC: Primary care physician; [name], MD   Note: This dictation was prepared with Dragon dictation along with smaller phrase technology. Any transcriptional errors that result from this process are unintentional.

## 2021-06-02 NOTE — Telephone Encounter (Signed)
This encounter was created in error - please disregard.

## 2021-06-03 ENCOUNTER — Telehealth: Payer: Self-pay | Admitting: Primary Care

## 2021-06-03 DIAGNOSIS — I890 Lymphedema, not elsewhere classified: Secondary | ICD-10-CM | POA: Diagnosis not present

## 2021-06-03 DIAGNOSIS — E1151 Type 2 diabetes mellitus with diabetic peripheral angiopathy without gangrene: Secondary | ICD-10-CM | POA: Diagnosis not present

## 2021-06-03 DIAGNOSIS — I5032 Chronic diastolic (congestive) heart failure: Secondary | ICD-10-CM | POA: Diagnosis not present

## 2021-06-03 DIAGNOSIS — L03116 Cellulitis of left lower limb: Secondary | ICD-10-CM | POA: Diagnosis not present

## 2021-06-03 DIAGNOSIS — I130 Hypertensive heart and chronic kidney disease with heart failure and stage 1 through stage 4 chronic kidney disease, or unspecified chronic kidney disease: Secondary | ICD-10-CM | POA: Diagnosis not present

## 2021-06-03 DIAGNOSIS — E1122 Type 2 diabetes mellitus with diabetic chronic kidney disease: Secondary | ICD-10-CM | POA: Diagnosis not present

## 2021-06-03 DIAGNOSIS — I872 Venous insufficiency (chronic) (peripheral): Secondary | ICD-10-CM | POA: Diagnosis not present

## 2021-06-03 DIAGNOSIS — N1831 Chronic kidney disease, stage 3a: Secondary | ICD-10-CM | POA: Diagnosis not present

## 2021-06-03 DIAGNOSIS — S81852A Open bite, left lower leg, initial encounter: Secondary | ICD-10-CM | POA: Diagnosis not present

## 2021-06-03 NOTE — Telephone Encounter (Signed)
Patient last seen 06/10/2018. Patient is considered a new patient.  Beth, please disregard previous message. Thanks

## 2021-06-03 NOTE — Telephone Encounter (Signed)
Patient is scheduled to see Beth on 06/29/2021. He is requesting if visit can be by telephone.  Beth, please advise. Thanks

## 2021-06-04 LAB — CULTURE, BLOOD (ROUTINE X 2)
Culture: NO GROWTH
Culture: NO GROWTH

## 2021-06-06 ENCOUNTER — Ambulatory Visit: Payer: Medicare HMO | Admitting: Family

## 2021-06-06 ENCOUNTER — Telehealth: Payer: Self-pay

## 2021-06-06 NOTE — Telephone Encounter (Signed)
Please call the patient regarding his phone call over the weekend.  Please see if there are any specific symptoms he is having.  We will kind of issue is he having caring for himself.  Please find out how he is dealing with the loss of his wife.  Has he had any depression or anxiety?  Has he had any thoughts of harming himself?  He needs to be scheduled for follow-up with his PCP as well.  Thanks.

## 2021-06-06 NOTE — Telephone Encounter (Signed)
Patient called office this afternoon. He states that the nurse has not changed his wound  since Sunday or Monday. Not sure if the patient knows what day it is. Front office tried to figure out who is coming to the house and we are not sure who is taking care of his wound. Please call patient.

## 2021-06-06 NOTE — Telephone Encounter (Signed)
Mailbox is full and could not accept any messages. Could not leave a message to call back.

## 2021-06-06 NOTE — Telephone Encounter (Signed)
Patient called in for a home health recommendation. Pt has been Urinating on himself and is concerned over not taking care of himself.

## 2021-06-06 NOTE — Telephone Encounter (Signed)
Please advise, Patient was in the hospital 05/29/21

## 2021-06-07 NOTE — Telephone Encounter (Signed)
Spoke with Judeth Cornfield to let her know that pt stated that the wound care nurse does not call before she comes, she just shows up in the driveway. Pt stated that he needs to be called and an appt scheduled to make sure he is home when they come. Judeth Cornfield stated that they had not received the signed orders yet. I let her know that myself and Dr. Darrick Huntsman has been out of the office but that I faxed them over this morning with the covering doctor's signature. She stated that she had not received them but that she would take our phone conversation as a verbal order that way she could get the nurse to go ahead and call pt with an appt for tomorrow.

## 2021-06-07 NOTE — Telephone Encounter (Signed)
Tried to reach patient this morning no answer left message to call office.

## 2021-06-07 NOTE — Telephone Encounter (Signed)
LMTCB with [facility] who provides his wound care services.

## 2021-06-07 NOTE — Telephone Encounter (Signed)
Judeth Cornfield from [facility] called to speak to [place].

## 2021-06-07 NOTE — Telephone Encounter (Signed)
Patient has a friend staying with him today and taking him to get some personal matters taken care of per patient personal contact. Patient contact will ask patient to give office a call he should be available after lunch.

## 2021-06-07 NOTE — Telephone Encounter (Signed)
Patient says the wound nurse has been out they just call when they get in his driveway and they expect him to be there. Patient would like the wound care nurse to give advance notice of when they are coming.Can we notify the wound care?

## 2021-06-08 DIAGNOSIS — I890 Lymphedema, not elsewhere classified: Secondary | ICD-10-CM | POA: Diagnosis not present

## 2021-06-08 DIAGNOSIS — E1151 Type 2 diabetes mellitus with diabetic peripheral angiopathy without gangrene: Secondary | ICD-10-CM | POA: Diagnosis not present

## 2021-06-08 DIAGNOSIS — S81852A Open bite, left lower leg, initial encounter: Secondary | ICD-10-CM | POA: Diagnosis not present

## 2021-06-08 DIAGNOSIS — N1831 Chronic kidney disease, stage 3a: Secondary | ICD-10-CM | POA: Diagnosis not present

## 2021-06-08 DIAGNOSIS — I130 Hypertensive heart and chronic kidney disease with heart failure and stage 1 through stage 4 chronic kidney disease, or unspecified chronic kidney disease: Secondary | ICD-10-CM | POA: Diagnosis not present

## 2021-06-08 DIAGNOSIS — I5032 Chronic diastolic (congestive) heart failure: Secondary | ICD-10-CM | POA: Diagnosis not present

## 2021-06-08 DIAGNOSIS — E1122 Type 2 diabetes mellitus with diabetic chronic kidney disease: Secondary | ICD-10-CM | POA: Diagnosis not present

## 2021-06-08 DIAGNOSIS — L03116 Cellulitis of left lower limb: Secondary | ICD-10-CM | POA: Diagnosis not present

## 2021-06-08 DIAGNOSIS — I872 Venous insufficiency (chronic) (peripheral): Secondary | ICD-10-CM | POA: Diagnosis not present

## 2021-06-09 DIAGNOSIS — I890 Lymphedema, not elsewhere classified: Secondary | ICD-10-CM | POA: Diagnosis not present

## 2021-06-09 DIAGNOSIS — N1831 Chronic kidney disease, stage 3a: Secondary | ICD-10-CM | POA: Diagnosis not present

## 2021-06-09 DIAGNOSIS — I130 Hypertensive heart and chronic kidney disease with heart failure and stage 1 through stage 4 chronic kidney disease, or unspecified chronic kidney disease: Secondary | ICD-10-CM | POA: Diagnosis not present

## 2021-06-09 DIAGNOSIS — E1122 Type 2 diabetes mellitus with diabetic chronic kidney disease: Secondary | ICD-10-CM | POA: Diagnosis not present

## 2021-06-09 DIAGNOSIS — I5032 Chronic diastolic (congestive) heart failure: Secondary | ICD-10-CM | POA: Diagnosis not present

## 2021-06-09 DIAGNOSIS — E1151 Type 2 diabetes mellitus with diabetic peripheral angiopathy without gangrene: Secondary | ICD-10-CM | POA: Diagnosis not present

## 2021-06-09 DIAGNOSIS — I872 Venous insufficiency (chronic) (peripheral): Secondary | ICD-10-CM | POA: Diagnosis not present

## 2021-06-09 DIAGNOSIS — S81852A Open bite, left lower leg, initial encounter: Secondary | ICD-10-CM | POA: Diagnosis not present

## 2021-06-09 DIAGNOSIS — L03116 Cellulitis of left lower limb: Secondary | ICD-10-CM | POA: Diagnosis not present

## 2021-06-10 ENCOUNTER — Emergency Department
Admission: EM | Admit: 2021-06-10 | Discharge: 2021-06-10 | Disposition: A | Discharge status: Home / Self Care | Payer: Medicare HMO | Attending: Emergency Medicine | Admitting: Emergency Medicine

## 2021-06-10 ENCOUNTER — Other Ambulatory Visit: Payer: Self-pay

## 2021-06-10 ENCOUNTER — Ambulatory Visit: Payer: Medicare HMO | Admitting: Physician Assistant

## 2021-06-10 ENCOUNTER — Encounter: Payer: Self-pay | Admitting: Emergency Medicine

## 2021-06-10 DIAGNOSIS — Z7901 Long term (current) use of anticoagulants: Secondary | ICD-10-CM | POA: Insufficient documentation

## 2021-06-10 DIAGNOSIS — I5032 Chronic diastolic (congestive) heart failure: Secondary | ICD-10-CM | POA: Insufficient documentation

## 2021-06-10 DIAGNOSIS — T887XXA Unspecified adverse effect of drug or medicament, initial encounter: Secondary | ICD-10-CM | POA: Diagnosis not present

## 2021-06-10 DIAGNOSIS — G8929 Other chronic pain: Secondary | ICD-10-CM | POA: Insufficient documentation

## 2021-06-10 DIAGNOSIS — I2510 Atherosclerotic heart disease of native coronary artery without angina pectoris: Secondary | ICD-10-CM | POA: Diagnosis not present

## 2021-06-10 DIAGNOSIS — Y900 Blood alcohol level of less than 20 mg/100 ml: Secondary | ICD-10-CM | POA: Diagnosis not present

## 2021-06-10 DIAGNOSIS — Z87891 Personal history of nicotine dependence: Secondary | ICD-10-CM | POA: Insufficient documentation

## 2021-06-10 DIAGNOSIS — Z96659 Presence of unspecified artificial knee joint: Secondary | ICD-10-CM | POA: Insufficient documentation

## 2021-06-10 DIAGNOSIS — J449 Chronic obstructive pulmonary disease, unspecified: Secondary | ICD-10-CM | POA: Insufficient documentation

## 2021-06-10 DIAGNOSIS — R0602 Shortness of breath: Secondary | ICD-10-CM | POA: Diagnosis not present

## 2021-06-10 DIAGNOSIS — I130 Hypertensive heart and chronic kidney disease with heart failure and stage 1 through stage 4 chronic kidney disease, or unspecified chronic kidney disease: Secondary | ICD-10-CM | POA: Insufficient documentation

## 2021-06-10 DIAGNOSIS — E1122 Type 2 diabetes mellitus with diabetic chronic kidney disease: Secondary | ICD-10-CM | POA: Insufficient documentation

## 2021-06-10 DIAGNOSIS — R410 Disorientation, unspecified: Secondary | ICD-10-CM | POA: Diagnosis not present

## 2021-06-10 DIAGNOSIS — Z79899 Other long term (current) drug therapy: Secondary | ICD-10-CM | POA: Insufficient documentation

## 2021-06-10 DIAGNOSIS — M79605 Pain in left leg: Secondary | ICD-10-CM | POA: Insufficient documentation

## 2021-06-10 DIAGNOSIS — R4182 Altered mental status, unspecified: Secondary | ICD-10-CM | POA: Insufficient documentation

## 2021-06-10 DIAGNOSIS — N1831 Chronic kidney disease, stage 3a: Secondary | ICD-10-CM | POA: Diagnosis not present

## 2021-06-10 DIAGNOSIS — Z7951 Long term (current) use of inhaled steroids: Secondary | ICD-10-CM | POA: Insufficient documentation

## 2021-06-10 DIAGNOSIS — T402X1A Poisoning by other opioids, accidental (unintentional), initial encounter: Secondary | ICD-10-CM | POA: Insufficient documentation

## 2021-06-10 DIAGNOSIS — R404 Transient alteration of awareness: Secondary | ICD-10-CM | POA: Diagnosis not present

## 2021-06-10 DIAGNOSIS — N179 Acute kidney failure, unspecified: Secondary | ICD-10-CM | POA: Diagnosis not present

## 2021-06-10 DIAGNOSIS — I10 Essential (primary) hypertension: Secondary | ICD-10-CM | POA: Diagnosis not present

## 2021-06-10 DIAGNOSIS — T50901A Poisoning by unspecified drugs, medicaments and biological substances, accidental (unintentional), initial encounter: Secondary | ICD-10-CM

## 2021-06-10 DIAGNOSIS — T50904A Poisoning by unspecified drugs, medicaments and biological substances, undetermined, initial encounter: Secondary | ICD-10-CM | POA: Diagnosis not present

## 2021-06-10 LAB — COMPREHENSIVE METABOLIC PANEL
ALT: 25 U/L (ref 0–44)
AST: 23 U/L (ref 15–41)
Albumin: 2.5 g/dL — ABNORMAL LOW (ref 3.5–5.0)
Alkaline Phosphatase: 101 U/L (ref 38–126)
Anion gap: 6 (ref 5–15)
BUN: 48 mg/dL — ABNORMAL HIGH (ref 8–23)
CO2: 33 mmol/L — ABNORMAL HIGH (ref 22–32)
Calcium: 8.8 mg/dL — ABNORMAL LOW (ref 8.9–10.3)
Chloride: 94 mmol/L — ABNORMAL LOW (ref 98–111)
Creatinine, Ser: 2.11 mg/dL — ABNORMAL HIGH (ref 0.61–1.24)
GFR, Estimated: 32 mL/min — ABNORMAL LOW (ref >60)
Glucose, Bld: 107 mg/dL — ABNORMAL HIGH (ref 70–99)
Potassium: 3.9 mmol/L (ref 3.5–5.1)
Sodium: 133 mmol/L — ABNORMAL LOW (ref 135–145)
Total Bilirubin: 0.7 mg/dL (ref 0.3–1.2)
Total Protein: 8 g/dL (ref 6.5–8.1)

## 2021-06-10 LAB — CBC WITH DIFFERENTIAL/PLATELET
Abs Immature Granulocytes: 0.01 10*3/uL (ref 0.00–0.07)
Basophils Absolute: 0.1 10*3/uL (ref 0.0–0.1)
Basophils Relative: 1 %
Eosinophils Absolute: 0.6 10*3/uL — ABNORMAL HIGH (ref 0.0–0.5)
Eosinophils Relative: 8 %
HCT: 29.9 % — ABNORMAL LOW (ref 39.0–52.0)
Hemoglobin: 8.9 g/dL — ABNORMAL LOW (ref 13.0–17.0)
Immature Granulocytes: 0 %
Lymphocytes Relative: 21 %
Lymphs Abs: 1.5 10*3/uL (ref 0.7–4.0)
MCH: 24.9 pg — ABNORMAL LOW (ref 26.0–34.0)
MCHC: 29.8 g/dL — ABNORMAL LOW (ref 30.0–36.0)
MCV: 83.5 fL (ref 80.0–100.0)
Monocytes Absolute: 0.8 10*3/uL (ref 0.1–1.0)
Monocytes Relative: 11 %
Neutro Abs: 3.9 10*3/uL (ref 1.7–7.7)
Neutrophils Relative %: 59 %
Platelets: 275 10*3/uL (ref 150–400)
RBC: 3.58 MIL/uL — ABNORMAL LOW (ref 4.22–5.81)
RDW: 16.6 % — ABNORMAL HIGH (ref 11.5–15.5)
WBC: 6.8 10*3/uL (ref 4.0–10.5)
nRBC: 0 % (ref 0.0–0.2)

## 2021-06-10 LAB — URINALYSIS, COMPLETE (UACMP) WITH MICROSCOPIC
Bacteria, UA: NONE SEEN
Bilirubin Urine: NEGATIVE
Glucose, UA: NEGATIVE mg/dL
Ketones, ur: NEGATIVE mg/dL
Nitrite: NEGATIVE
Protein, ur: NEGATIVE mg/dL
Specific Gravity, Urine: 1.017 (ref 1.005–1.030)
pH: 5 (ref 5.0–8.0)

## 2021-06-10 LAB — URINE DRUG SCREEN, QUALITATIVE (ARMC ONLY)
Amphetamines, Ur Screen: NOT DETECTED
Barbiturates, Ur Screen: NOT DETECTED
Benzodiazepine, Ur Scrn: NOT DETECTED
Cannabinoid 50 Ng, Ur ~~LOC~~: NOT DETECTED
Cocaine Metabolite,Ur ~~LOC~~: NOT DETECTED
MDMA (Ecstasy)Ur Screen: NOT DETECTED
Methadone Scn, Ur: NOT DETECTED
Opiate, Ur Screen: POSITIVE — AB
Phencyclidine (PCP) Ur S: NOT DETECTED
Tricyclic, Ur Screen: NOT DETECTED

## 2021-06-10 LAB — ACETAMINOPHEN LEVEL: Acetaminophen (Tylenol), Serum: <10 ug/mL — ABNORMAL LOW (ref 10–30)

## 2021-06-10 LAB — LACTIC ACID, PLASMA: Lactic Acid, Venous: 0.8 mmol/L (ref 0.5–1.9)

## 2021-06-10 LAB — TROPONIN I (HIGH SENSITIVITY)
Troponin I (High Sensitivity): 12 ng/L (ref <18)
Troponin I (High Sensitivity): 12 ng/L (ref <18)

## 2021-06-10 LAB — ETHANOL: Alcohol, Ethyl (B): <10 mg/dL (ref <10)

## 2021-06-10 LAB — SALICYLATE LEVEL: Salicylate Lvl: <7 mg/dL — ABNORMAL LOW (ref 7.0–30.0)

## 2021-06-10 MED ORDER — SODIUM CHLORIDE 0.9 % IV BOLUS
500.0000 mL | Freq: Once | INTRAVENOUS | Status: AC
  Administered 2021-06-10: 500 mL via INTRAVENOUS

## 2021-06-10 NOTE — ED Notes (Signed)
Pt resting with eyes closed. Awaiting his test results. VSS. NAD. Call light within reach. Will continue to monitor.

## 2021-06-10 NOTE — ED Triage Notes (Signed)
Pt brought in by [facility] with c/o of altered mental status changes. His neighbor went to check on him like they normally do and he was not acting himself.
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
## 2021-06-10 NOTE — Discharge Instructions (Addendum)
Your lab work shows an elevated creatinine of 2, which represents your kidney function.  Make sure to drink plenty of fluids.  Avoid taking more than your prescribed dosage of oxycodone or other medications.  Return to the ER for new or worsening weakness, confusion, fever, vomiting, decreased urination, dehydration, or any other new or worsening symptoms that concern you.  Follow-up with your primary care doctor within the next 1 to 2 weeks to have your kidney function rechecked and monitored.  You may also return to the ER anytime if you change your mind and wish to resume your care in the hospital.

## 2021-06-13 NOTE — Telephone Encounter (Signed)
Patient called and stated no one has come to change his bandages. Patient could not give the date of the last time bandages were changed. He has no clue who the nurse is, patient would like a phone number to call nurse.

## 2021-06-14 ENCOUNTER — Telehealth: Payer: Self-pay

## 2021-06-14 ENCOUNTER — Other Ambulatory Visit: Payer: Self-pay | Admitting: Internal Medicine

## 2021-06-14 ENCOUNTER — Encounter: Payer: Self-pay | Admitting: Internal Medicine

## 2021-06-14 DIAGNOSIS — D631 Anemia in chronic kidney disease: Secondary | ICD-10-CM

## 2021-06-14 DIAGNOSIS — Z87891 Personal history of nicotine dependence: Secondary | ICD-10-CM

## 2021-06-14 DIAGNOSIS — R338 Other retention of urine: Secondary | ICD-10-CM

## 2021-06-14 DIAGNOSIS — I130 Hypertensive heart and chronic kidney disease with heart failure and stage 1 through stage 4 chronic kidney disease, or unspecified chronic kidney disease: Secondary | ICD-10-CM | POA: Diagnosis not present

## 2021-06-14 DIAGNOSIS — E1122 Type 2 diabetes mellitus with diabetic chronic kidney disease: Secondary | ICD-10-CM

## 2021-06-14 DIAGNOSIS — I252 Old myocardial infarction: Secondary | ICD-10-CM

## 2021-06-14 DIAGNOSIS — I2510 Atherosclerotic heart disease of native coronary artery without angina pectoris: Secondary | ICD-10-CM

## 2021-06-14 DIAGNOSIS — J449 Chronic obstructive pulmonary disease, unspecified: Secondary | ICD-10-CM

## 2021-06-14 DIAGNOSIS — I69318 Other symptoms and signs involving cognitive functions following cerebral infarction: Secondary | ICD-10-CM

## 2021-06-14 DIAGNOSIS — Z905 Acquired absence of kidney: Secondary | ICD-10-CM

## 2021-06-14 DIAGNOSIS — E785 Hyperlipidemia, unspecified: Secondary | ICD-10-CM

## 2021-06-14 DIAGNOSIS — E1151 Type 2 diabetes mellitus with diabetic peripheral angiopathy without gangrene: Secondary | ICD-10-CM | POA: Diagnosis not present

## 2021-06-14 DIAGNOSIS — H9193 Unspecified hearing loss, bilateral: Secondary | ICD-10-CM

## 2021-06-14 DIAGNOSIS — M4722 Other spondylosis with radiculopathy, cervical region: Secondary | ICD-10-CM

## 2021-06-14 DIAGNOSIS — F1011 Alcohol abuse, in remission: Secondary | ICD-10-CM

## 2021-06-14 DIAGNOSIS — N1831 Chronic kidney disease, stage 3a: Secondary | ICD-10-CM | POA: Diagnosis not present

## 2021-06-14 DIAGNOSIS — I890 Lymphedema, not elsewhere classified: Secondary | ICD-10-CM | POA: Diagnosis not present

## 2021-06-14 DIAGNOSIS — G4733 Obstructive sleep apnea (adult) (pediatric): Secondary | ICD-10-CM

## 2021-06-14 DIAGNOSIS — Z634 Disappearance and death of family member: Secondary | ICD-10-CM

## 2021-06-14 DIAGNOSIS — M19012 Primary osteoarthritis, left shoulder: Secondary | ICD-10-CM

## 2021-06-14 DIAGNOSIS — L03116 Cellulitis of left lower limb: Secondary | ICD-10-CM | POA: Diagnosis not present

## 2021-06-14 DIAGNOSIS — D692 Other nonthrombocytopenic purpura: Secondary | ICD-10-CM

## 2021-06-14 DIAGNOSIS — Z9981 Dependence on supplemental oxygen: Secondary | ICD-10-CM

## 2021-06-14 DIAGNOSIS — D6859 Other primary thrombophilia: Secondary | ICD-10-CM

## 2021-06-14 DIAGNOSIS — S81852A Open bite, left lower leg, initial encounter: Secondary | ICD-10-CM | POA: Diagnosis not present

## 2021-06-14 DIAGNOSIS — R627 Adult failure to thrive: Secondary | ICD-10-CM

## 2021-06-14 DIAGNOSIS — N39498 Other specified urinary incontinence: Secondary | ICD-10-CM

## 2021-06-14 DIAGNOSIS — F5102 Adjustment insomnia: Secondary | ICD-10-CM

## 2021-06-14 DIAGNOSIS — Z7901 Long term (current) use of anticoagulants: Secondary | ICD-10-CM

## 2021-06-14 DIAGNOSIS — I4891 Unspecified atrial fibrillation: Secondary | ICD-10-CM

## 2021-06-14 DIAGNOSIS — K2920 Alcoholic gastritis without bleeding: Secondary | ICD-10-CM

## 2021-06-14 DIAGNOSIS — N401 Enlarged prostate with lower urinary tract symptoms: Secondary | ICD-10-CM

## 2021-06-14 DIAGNOSIS — Z602 Problems related to living alone: Secondary | ICD-10-CM

## 2021-06-14 DIAGNOSIS — I5032 Chronic diastolic (congestive) heart failure: Secondary | ICD-10-CM | POA: Diagnosis not present

## 2021-06-14 DIAGNOSIS — H547 Unspecified visual loss: Secondary | ICD-10-CM

## 2021-06-14 DIAGNOSIS — T50901A Poisoning by unspecified drugs, medicaments and biological substances, accidental (unintentional), initial encounter: Secondary | ICD-10-CM | POA: Insufficient documentation

## 2021-06-14 DIAGNOSIS — Z9181 History of falling: Secondary | ICD-10-CM

## 2021-06-14 DIAGNOSIS — E876 Hypokalemia: Secondary | ICD-10-CM

## 2021-06-14 DIAGNOSIS — Z86718 Personal history of other venous thrombosis and embolism: Secondary | ICD-10-CM

## 2021-06-14 DIAGNOSIS — D510 Vitamin B12 deficiency anemia due to intrinsic factor deficiency: Secondary | ICD-10-CM

## 2021-06-14 DIAGNOSIS — D509 Iron deficiency anemia, unspecified: Secondary | ICD-10-CM

## 2021-06-14 DIAGNOSIS — R351 Nocturia: Secondary | ICD-10-CM

## 2021-06-14 DIAGNOSIS — I872 Venous insufficiency (chronic) (peripheral): Secondary | ICD-10-CM | POA: Diagnosis not present

## 2021-06-14 NOTE — Telephone Encounter (Signed)
Lisa-a nurse with [facility] called and states that pt needs a new rx sent in if Dr Darrick Huntsman wants him to continue it. She states that he must have been put on that at the hospital and now the pharmacy states that they need a new rx. She also wanted Dr Darrick Huntsman to know that she discovered a wound on his left heel and she will send over orders about that via fax. Her number is [phone]

## 2021-06-14 NOTE — Telephone Encounter (Signed)
LMTCB with Delice Bison, RN at [facility].

## 2021-06-14 NOTE — Telephone Encounter (Signed)
Spoke with Misty Stanley, RN with [facility] she stated that the pt has been getting wound care twice a week. She stated that one time last week when she went out the pt had been taken to the ED for confusion. She stated the neighbor/friend that checks on him said pt was very confusion, had no clothes on, had soiled himself, did not know who he was or where he was at. Pt was not admitted he stayed in ED for observation and was discharged. Misty Stanley went back today for wound care and nursing. She stated that while she was dressing the wound on his leg she noticed another wound on his left heel that they are going to start dressing. She also stated that pt has not been taking any of his medication. She sat down with the friend and friend's wife, threw out all medications that pt should not be taking, put meds in a pill box. Friend's wife stated that she is going to come over daily and make sure that pt is taking his medications from the pill box. Misty Stanley also stated that pt was taking Xarelto but when she went to refill it both [facility] and [facility] stated that there was not a rx on file. Misty Stanley is not sure if pt was taking this medication before going to the hospital the first time or they put him on it there. If he needs to be taking it she needs a new rx sent to  pharmacy, pt is out of medication.

## 2021-06-14 NOTE — Telephone Encounter (Signed)
See other message with details.

## 2021-06-15 ENCOUNTER — Ambulatory Visit: Payer: Medicare HMO | Admitting: Pharmacist

## 2021-06-15 DIAGNOSIS — I10 Essential (primary) hypertension: Secondary | ICD-10-CM

## 2021-06-15 DIAGNOSIS — E782 Mixed hyperlipidemia: Secondary | ICD-10-CM

## 2021-06-15 DIAGNOSIS — I25118 Atherosclerotic heart disease of native coronary artery with other forms of angina pectoris: Secondary | ICD-10-CM

## 2021-06-15 DIAGNOSIS — J431 Panlobular emphysema: Secondary | ICD-10-CM

## 2021-06-15 DIAGNOSIS — I4819 Other persistent atrial fibrillation: Secondary | ICD-10-CM

## 2021-06-15 DIAGNOSIS — R600 Localized edema: Secondary | ICD-10-CM

## 2021-06-15 DIAGNOSIS — I5030 Unspecified diastolic (congestive) heart failure: Secondary | ICD-10-CM

## 2021-06-15 NOTE — Chronic Care Management (AMB) (Signed)
Chronic Care Management Pharmacy Note  06/15/2021 Name:  Victor Daniels MRN:  [id number] DOB:  [birth year]  Subjective: Victor Daniels is an 75 y.o. year old male who is a primary patient of Tullo, Aris Everts, MD.  The CCM team was consulted for assistance with disease management and care coordination needs.    Engaged with patient by telephone for follow up visit in response to provider referral for pharmacy case management and/or care coordination services.   Consent to Services:  The patient was given information about Chronic Care Management services, agreed to services, and gave verbal consent prior to initiation of services.  Please see initial visit note for detailed documentation.   Patient Care Team: Crecencio Mc, MD as PCP - General (Internal Medicine) Minna Merritts, MD as PCP - Cardiology (Cardiology) Crecencio Mc, MD as Referring Physician (Internal Medicine) Lucky Cowboy Erskine Squibb, MD as Surgeon (Surgery) Wilhelmina Mcardle, MD (Inactive) as Consulting Physician (Pulmonary Disease) De Hollingshead, RPH-CPP as Pharmacist (Pharmacist) Leona Singleton, RN as [place] [facility] visits: 6/5-6/7 for cellulitis   Objective:  Lab Results  Component Value Date   CREATININE 2.11 (H) 06/10/2021   CREATININE 1.04 05/31/2021   CREATININE 1.15 05/30/2021    Lab Results  Component Value Date   HGBA1C 6.1 (H) 02/11/2021   Last diabetic Eye exam:  Lab Results  Component Value Date/Time   HMDIABEYEEXA No Retinopathy 08/14/2017 12:00 AM    Last diabetic Foot exam: No results found for: HMDIABFOOTEX      Component Value Date/Time   CHOL 103 02/11/2021 1430   TRIG 54 02/11/2021 1430   HDL 37 (L) 02/11/2021 1430   CHOLHDL 2.8 02/11/2021 1430   VLDL 20.8 03/15/2020 1051   LDLCALC 53 02/11/2021 1430   LDLDIRECT 72.0 12/01/2016 1459    Hepatic Function Latest Ref Rng & Units 06/10/2021 05/29/2021 05/26/2021  Total Protein 6.5 - 8.1 g/dL 8.0 8.0 7.4   Albumin 3.5 - 5.0 g/dL 2.5(L) 2.3(L) 2.6(L)  AST 15 - 41 U/L _0 ALT 0 - 44 U/L _1 Alk Phosphatase 38 - 126 U/L 101 82 100  Total Bilirubin 0.3 - 1.2 mg/dL 0.7 1.3(H) 0.8    Lab Results  Component Value Date/Time   TSH 1.84 05/26/2021 11:50 AM   TSH 2.088 06/23/2020 05:26 AM   TSH 1.52 03/15/2020 10:51 AM   FREET4 0.84 06/09/2013 12:05 PM    CBC Latest Ref Rng & Units 06/10/2021 05/31/2021 05/30/2021  WBC 4.0 - 10.5 K/uL 6.8 7.8 8.5  Hemoglobin 13.0 - 17.0 g/dL 8.9(L) 9.9(L) 9.8(L)  Hematocrit 39.0 - 52.0 % 29.9(L) 32.2(L) 32.2(L)  Platelets 150 - 400 K/uL 275 195 193    Lab Results  Component Value Date/Time   VD25OH 21.04 (L) 02/23/2016 02:05 PM   VD25OH 31.68 06/25/2015 09:38 AM    Clinical ASCVD: Yes  The ASCVD Risk score Mikey Bussing DC Jr., et al., 2013) failed to calculate for the following reasons:   The patient has a prior MI or stroke diagnosis     CHA2DS2-VASc Score = 6  This indicates a 9.7% annual risk of stroke. The patient's score is based upon: CHF History: Yes HTN History: Yes Diabetes History: No Stroke History: Yes Vascular Disease History: Yes Age Score: 1 Gender Score: 0    Social History   Tobacco Use  Smoking Status Former   Packs/day: 2.00   Years: 25.00   Pack years:  50.00   Types: Cigarettes   Quit date: 09/26/2009   Years since quitting: 11.7  Smokeless Tobacco Never   BP Readings from Last 3 Encounters:  06/10/21 (!) 141/80  05/31/21 (!) 145/88  05/26/21 (!) 142/66   Pulse Readings from Last 3 Encounters:  06/10/21 74  05/31/21 97  05/26/21 93   Wt Readings from Last 3 Encounters:  06/10/21 217 lb 13 oz (98.8 kg)  05/30/21 217 lb 13 oz (98.8 kg)  05/26/21 240 lb (108.9 kg)    Assessment: Review of patient past medical history, allergies, medications, health status, including review of consultants reports, laboratory and other test data, was performed as part of comprehensive evaluation and provision of chronic care  management services.   SDOH:  (Social Determinants of Health) assessments and interventions performed:  SDOH Interventions    Flowsheet Row Most Recent Value  SDOH Interventions   Financial Strain Interventions Intervention Not Indicated  Transportation Interventions [facility], Other (Comment)  [Care Guide referral placed]       CCM Care Plan  Allergies  Allergen Reactions   Clonidine Derivatives     Reaction unknown   Sulfa Antibiotics     Reaction unknown    Medications Reviewed Today     Reviewed by Trula Slade, RN (Registered Nurse) on 05/29/21 at 1442  Med List Status: Complete   Medication Order Taking? Sig Documenting Provider Last Dose Status Informant  albuterol (PROVENTIL) (2.5 MG/3ML) 0.083% nebulizer solution [id number] Yes Take 3 mLs (2.5 mg total) by nebulization every 6 (six) hours as needed for wheezing or shortness of breath. Loletha Grayer, MD prn prn Active Self  ciprofloxacin (CIPRO) 500 MG tablet [id number] Yes Take 1 tablet (500 mg total) by mouth 2 (two) times daily for 7 days. With food McLean-Scocuzza, Nino Glow, MD 05/27/2021 unk Active Self  doxycycline (VIBRA-TABS) 100 MG tablet [id number] Yes Take 1 tablet (100 mg total) by mouth 2 (two) times daily. With food McLean-Scocuzza, Nino Glow, MD 05/27/2021 unk Active Self  Fluticasone-Umeclidin-Vilant (TRELEGY ELLIPTA) 100-62.5-25 MCG/INH AEPB [id number] Yes Inhale 1 puff into the lungs daily. Crecencio Mc, MD 05/27/2021 unk Active Self  furosemide (LASIX) 40 MG tablet [id number] Yes Take 1 tablet (40 mg total) by mouth 2 (two) times daily. McLean-Scocuzza, Nino Glow, MD 05/27/2021 unk Active Self  ipratropium (ATROVENT) 0.03 % nasal spray [id number] Yes Place into both nostrils 2 (two) times daily as needed. [provider] prn prn Active Self  ipratropium-albuterol (DUONEB) 0.5-2.5 (3) MG/3ML SOLN [id number] Yes USE 1 VIAL IN NEBULIZER EVERY 6 HOURS AS NEEDED FOR WHEEZING Derrel Nip, Aris Everts,  MD prn prn Active Self  losartan (COZAAR) 25 MG tablet [id number] Yes Take 1 tablet (25 mg total) by mouth daily. Crecencio Mc, MD 05/27/2021 unk Active Self  methocarbamol (ROBAXIN) 500 MG tablet [id number] Yes Take 500 mg by mouth every 6 (six) hours as needed. [provider] 05/27/2021 unk Active Self  mupirocin ointment (BACTROBAN) 2 % [id number]  Apply 1 application topically 2 (two) times daily. McLean-Scocuzza, Nino Glow, MD  Active Self  nitroGLYCERIN (NITROSTAT) 0.4 MG SL tablet [id number] Yes Place 1 tablet (0.4 mg total) under the tongue every 5 (five) minutes as needed for chest pain. Maximum dose 3 tablets Crecencio Mc, MD prn prn Active Self           Med Note Henreitta Cea May 29, 2021 11:47 AM) Pt states that he has medication but has never had  to use it   OVER THE COUNTER MEDICATION [id number] No Super Beta Prostate Supplement  Patient not taking: Reported on 05/29/2021   [provider] Not Taking Unknown time Consider Medication Status and Discontinue Self  oxyCODONE (ROXICODONE) 15 MG immediate release tablet [id number] Yes Take 1 tablet (15 mg total) by mouth every 6 (six) hours as needed. Crecencio Mc, MD 05/27/2021 Active Self  oxyCODONE (ROXICODONE) 15 MG immediate release tablet [id number] No Take 1 tablet (15 mg total) by mouth every 6 (six) hours as needed for pain.  Patient not taking: Reported on 05/29/2021   Crecencio Mc, MD Not Taking Unknown time Consider Medication Status and Discontinue Self  oxyCODONE (ROXICODONE) 15 MG immediate release tablet [id number] No Take 1 tablet (15 mg total) by mouth every 6 (six) hours as needed.  Patient not taking: Reported on 05/29/2021   Crecencio Mc, MD Not Taking Unknown time Consider Medication Status and Discontinue Self  OXYGEN [id number]  Inhale 2 L into the lungs 3 (three) times daily as needed (shortness of breath).  [provider]  Active Self  rivaroxaban (XARELTO) 20 MG TABS tablet  [id number] Yes Take 20 mg by mouth daily with supper. [provider] 05/27/2021 unk Active Self  simvastatin (ZOCOR) 20 MG tablet [id number] Yes Take 1 tablet (20 mg total) by mouth at bedtime. Crecencio Mc, MD 05/27/2021 unk Active Self  spironolactone (ALDACTONE) 50 MG tablet [id number] Yes Take 1 tablet (50 mg total) by mouth daily. In am McLean-Scocuzza, Nino Glow, MD 05/27/2021 unk Active Self  traZODone (DESYREL) 50 MG tablet [id number] Yes Take 1 tablet (50 mg total) by mouth at bedtime. Crecencio Mc, MD prn prn Active Self           Med Note Nat Christen Apr 14, 2021  1:38 PM) Taking PRN  VENTOLIN HFA 108 (90 Base) MCG/ACT inhaler [id number] Yes INHALE 2 PUFFS BY MOUTH EVERY 6 HOURS AS NEEDED FOR WHEEZING OR SHORTNESS OF Nicholes Mango Crecencio Mc, MD 05/27/2021 unk Active Self  vitamin B-12 (CYANOCOBALAMIN) 1000 MCG tablet [id number] No Take 1,000 mcg by mouth daily.  Patient not taking: Reported on 05/29/2021   [provider] Not Taking Unknown time Consider Medication Status and Discontinue Self            Patient Active Problem List   Diagnosis Date Noted   Accidental drug overdose 06/14/2021   Cellulitis of left lower extremity 05/29/2021   Wound infection 05/29/2021   Chronic diastolic CHF (congestive heart failure) ([place]) 05/29/2021   Hypokalemia 05/29/2021   Lymphedema 05/26/2021   Abnormal gait 05/26/2021   Insomnia due to psychological stress 03/26/2021   Failure to thrive syndrome, adult 03/23/2021   DVT (deep venous thrombosis) (HCC)    Leukocytosis    Hematuria, gross 07/19/2020   Acquired thrombophilia ([place]) 07/04/2020   Atrial fibrillation ([place])    CKD (chronic kidney disease), stage IIIa 03/30/2020   Degenerative joint disease of shoulder region 09/26/2018   Obstructive sleep apnea 11/22/2016   Hypogonadism male 07/07/2016   Obesity 06/03/2016   Reduced libido 05/11/2016   Visit for preventive health examination 03/01/2016   Diabetes  mellitus with circulatory complication ([place]) 70/76/1518   Shoulder pain, left 08/03/2015   Pneumonia 34/37/3578   Marital conflict involving estrangement 04/13/2015   Cervical radiculopathy due to degenerative joint disease of spine 03/24/2015   Iron deficiency anemia 02/25/2015   Generalized weakness 02/25/2015   Nocturia 02/25/2015  Pernicious anemia 01/14/2015   S/P hip replacement 12/12/2014   S/p nephrectomy 09/07/2014   Vertigo, central 08/20/2014   Senile purpura ([place]) 06/28/2014   Incomplete emptying of bladder 10/08/2013   Anemia 09/11/2013   Chest pain with high risk for cardiac etiology 04/08/2013   Benign localized prostatic hyperplasia with lower urinary tract symptoms (LUTS) 10/23/2012   Sciatica 10/23/2012   Venous insufficiency (chronic) (peripheral) 07/08/2012   Venous insufficiency    History of alcohol abuse    Vasomotor rhinitis 12/06/2011   CAD (coronary artery disease) 10/12/2011   COPD (chronic obstructive pulmonary disease) ([place])    CHF with left ventricular diastolic dysfunction, NYHA class 2 ([place])    Hyperlipidemia    Hypertension     Immunization History  Administered Date(s) Administered   Influenza Split 10/09/2011, 12/03/2012   Influenza, High Dose Seasonal PF 09/01/2016, 09/12/2017, 12/20/2018   Influenza,inj,Quad PF,6+ Mos 09/10/2013, 09/19/2014   PFIZER(Purple Top)SARS-COV-2 Vaccination 10/01/2020, 11/02/2020   Pneumococcal Conjugate-13 02/23/2016   Pneumococcal Polysaccharide-23 02/21/2010, 12/20/2018   Td 05/26/2021   Tdap 05/25/2010    Conditions to be addressed/monitored: Atrial Fibrillation, CHF, CAD, HTN, and HLD  Care Plan : PharmD - Medication Management  Updates made by De Hollingshead, RPH-CPP since 06/15/2021 12:00 AM     Problem: Heart Failure, COPD      Long-Range Goal: Disease Progression Prevention   This Visit's Progress: On track  Recent Progress: On track  Priority: High  Note:   Current Barriers:  Unable  to independently monitor therapeutic efficacy Complex patient with multiple comorbidities including heart failure, COPD  Pharmacist Clinical Goal(s):  Over the next 90 days, patient will maintain control of heart failure as evidenced by no unnecessary hospitalizations Over the next 90 days, patient will adhere to prescribed medication regimen.  Interventions: Inter-disciplinary care team collaboration (see longitudinal plan of care) Comprehensive medication review performed; medication list updated in electronic medical record  Health Maintenance: Receiving [facility] RN services and wound care. HH RN has called in with concerns about patient's ability to care for himself.   Medication Management: Per documentation, [place] RN sat down with patient's neighbor, Vicente Serene and Vickii Chafe, and helped organize medications. Had questions about Xarelto and needing refills.   Heart Failure (recovered, EF 55%), Atrial Fibrillation Uncontrolled due to medication nonadherence; current treatment: losartan 25 mg QPM, spironolactone 50 mg QAM, furosemide 40 mg QAM PRN Anticoagulant regimen: Xarelto 20 mg QPM; most recent eGFR in ED was <50, however, dehydration suspected. Discussed with Dr. Derrel Nip. Order placed for CMP by home health tomorrow to evaluate renal function and determine if Xarelto needs to be dose reduced.  Follow home BP readings now that [facility] is involved to help with medication adherence.  Discussed need to schedule f/u with Dr. Rockey Situ. Patient declines to continue to follow with Dr. Rockey Situ as "specialists never do anything". Encouraged to discuss referral to a different cardiologist with Dr. Derrel Nip at next appointment.   Hyperlipidemia and Secondary ASCVD Prevention - hx MI, CVA x2 Controlled per last lab work; current treatment: simvastatin 20 mg QPM No antiplatelet therapy d/t concurrent anticoagulant Recommended to continue current regimen at this time. Stressed importance of adherence.  Discussed need to  schedule f/u with Dr. Rockey Situ. Patient declines to continue to follow with Dr. Rockey Situ as "specialists never do anything". Encouraged to discuss referral to a different cardiologist with Dr. Derrel Nip at next appointment.   Chronic Obstructive Pulmonary Disease: Uncontrolled; current treatment: Trelegy 100/62.5/25 mcg daily, albuterol HFA PRN or Duonebs  TID; oxygen 24 hours; reports minimal need for rescue therapy lately Due to re-establish with pulmonary. Will need transportation. Referral for Care Guide support placed today  Chronic Pain: Uncontrolled; recent ED visit for oxycodone overdose. PCP cancelled oxycodone refills. CMA will work to schedule f/u with patient in office. Transportation support through [place] team as above.   Insomnia: Moderately well controlled; current regimen: trazodone 25 mg QPM PRN, indicated improvement previously.  Previously recommend to continue current regimen at this time  Patient Goals/Self-Care Activities Over the next 90 days, patient will:  - take medications as prescribed weigh daily, and contact provider if weight gain of >3 lbs/day or 5 lbs/week collaborate with provider on medication access solutions  Follow Up Plan: Telephone follow up appointment with care management team member scheduled for: ~ 2 weeks     Medication Assistance: None required.  Patient affirms current coverage meets needs.  Patient's preferred pharmacy is:  [facility] ([place]) [place], [place] [address] Phone: [phone] Fax: [phone]  [facility] - [place], [place] [address] Phone: [phone] Fax: [phone]  [facility] [address] [place] - [place]) [place] [address] Phone: [phone] Fax:  [phone]  Uses pill box? Yes Pt endorses poor compliance  Follow Up:  Patient agrees to Care Plan and Follow-up.  Plan: Telephone follow up appointment with care management team member scheduled for:  ~ 2 weeks  Catie Darnelle Maffucci, PharmD, [place], [place] Clinical Pharmacist [employer] at [employer] [phone]

## 2021-06-15 NOTE — Telephone Encounter (Signed)
Spoke with Lattie Haw, RN. Gave verbal under Dr. Derrel Nip for CMP. She will draw tomorrow.   If eGFR remains <50, recommend reducing Xarelto dose to 15 mg daily. If back to baseline >50, recommend Xarelto 20 mg daily.   If eGFR <15 will need to evaluate appropriateness to change to Eliquis.

## 2021-06-15 NOTE — Patient Instructions (Signed)
Visit Information  PATIENT GOALS:  Goals Addressed               This Visit's Progress     Patient Stated     PharmD - Medication Monitoring (pt-stated)        Patient Goals/Self-Care Activities Over the next 90 days, patient will:  - take medications as prescribed weigh daily, and contact provider if weight gain of >3 lbs/day or 5 lbs/week collaborate with provider on medication access solutions          Patient verbalizes understanding of instructions provided today and agrees to view in MyChart.   Plan: Telephone follow up appointment with care management team member scheduled for:  ~ 2 weeks  Catie Feliz Beam, PharmD, [place], CPP Clinical Pharmacist [employer] at [employer] [phone]

## 2021-06-16 ENCOUNTER — Telehealth: Payer: Self-pay | Admitting: Internal Medicine

## 2021-06-16 DIAGNOSIS — S81852A Open bite, left lower leg, initial encounter: Secondary | ICD-10-CM | POA: Diagnosis not present

## 2021-06-16 DIAGNOSIS — I5032 Chronic diastolic (congestive) heart failure: Secondary | ICD-10-CM | POA: Diagnosis not present

## 2021-06-16 DIAGNOSIS — I872 Venous insufficiency (chronic) (peripheral): Secondary | ICD-10-CM | POA: Diagnosis not present

## 2021-06-16 DIAGNOSIS — E1122 Type 2 diabetes mellitus with diabetic chronic kidney disease: Secondary | ICD-10-CM | POA: Diagnosis not present

## 2021-06-16 DIAGNOSIS — I130 Hypertensive heart and chronic kidney disease with heart failure and stage 1 through stage 4 chronic kidney disease, or unspecified chronic kidney disease: Secondary | ICD-10-CM | POA: Diagnosis not present

## 2021-06-16 DIAGNOSIS — L03116 Cellulitis of left lower limb: Secondary | ICD-10-CM | POA: Diagnosis not present

## 2021-06-16 DIAGNOSIS — N1831 Chronic kidney disease, stage 3a: Secondary | ICD-10-CM | POA: Diagnosis not present

## 2021-06-16 DIAGNOSIS — I890 Lymphedema, not elsewhere classified: Secondary | ICD-10-CM | POA: Diagnosis not present

## 2021-06-16 DIAGNOSIS — E1151 Type 2 diabetes mellitus with diabetic peripheral angiopathy without gangrene: Secondary | ICD-10-CM | POA: Diagnosis not present

## 2021-06-16 NOTE — Telephone Encounter (Signed)
Stephanie called from [facility] in regards to PT. She would like to talk to Shanda Bumps in regards to orders that have been faxed over for over a week ago and no word about it. She would like to see with Shanda Bumps if it be possible to do Verbal orders.

## 2021-06-16 NOTE — Telephone Encounter (Signed)
Attempted to call Judeth Cornfield back to let her know that we have tried faxing them several times. No answer no voicemail. I have re faxed orders again.

## 2021-06-17 ENCOUNTER — Telehealth: Payer: Medicare HMO

## 2021-06-20 NOTE — Telephone Encounter (Signed)
LMTCB

## 2021-06-20 NOTE — Telephone Encounter (Addendum)
Patient called me today asking about 1) needing something for sleep and 2) wondering when he can get a refill on oxycodone. I let him know that he would need to set up an appointment with Dr. Darrick Huntsman to discuss this.   Routing to PCP and CMA. Unsure if patient has been notified that per below note from Dr. Darrick Huntsman, he will not be getting oxycodone refills until he is seen.   We can help arrange transportation for this appointment through Care Guide support.

## 2021-06-20 NOTE — Telephone Encounter (Signed)
Spoke with [facility] and got a different fax number to fax all orders to.

## 2021-06-21 DIAGNOSIS — N1831 Chronic kidney disease, stage 3a: Secondary | ICD-10-CM | POA: Diagnosis not present

## 2021-06-21 DIAGNOSIS — I872 Venous insufficiency (chronic) (peripheral): Secondary | ICD-10-CM | POA: Diagnosis not present

## 2021-06-21 DIAGNOSIS — E1151 Type 2 diabetes mellitus with diabetic peripheral angiopathy without gangrene: Secondary | ICD-10-CM | POA: Diagnosis not present

## 2021-06-21 DIAGNOSIS — I130 Hypertensive heart and chronic kidney disease with heart failure and stage 1 through stage 4 chronic kidney disease, or unspecified chronic kidney disease: Secondary | ICD-10-CM | POA: Diagnosis not present

## 2021-06-21 DIAGNOSIS — S81852A Open bite, left lower leg, initial encounter: Secondary | ICD-10-CM | POA: Diagnosis not present

## 2021-06-21 DIAGNOSIS — E1122 Type 2 diabetes mellitus with diabetic chronic kidney disease: Secondary | ICD-10-CM | POA: Diagnosis not present

## 2021-06-21 DIAGNOSIS — I5032 Chronic diastolic (congestive) heart failure: Secondary | ICD-10-CM | POA: Diagnosis not present

## 2021-06-21 DIAGNOSIS — I890 Lymphedema, not elsewhere classified: Secondary | ICD-10-CM | POA: Diagnosis not present

## 2021-06-21 DIAGNOSIS — L03116 Cellulitis of left lower limb: Secondary | ICD-10-CM | POA: Diagnosis not present

## 2021-06-22 DIAGNOSIS — E1122 Type 2 diabetes mellitus with diabetic chronic kidney disease: Secondary | ICD-10-CM | POA: Diagnosis not present

## 2021-06-22 DIAGNOSIS — I872 Venous insufficiency (chronic) (peripheral): Secondary | ICD-10-CM | POA: Diagnosis not present

## 2021-06-22 DIAGNOSIS — L03116 Cellulitis of left lower limb: Secondary | ICD-10-CM | POA: Diagnosis not present

## 2021-06-22 DIAGNOSIS — I5032 Chronic diastolic (congestive) heart failure: Secondary | ICD-10-CM | POA: Diagnosis not present

## 2021-06-22 DIAGNOSIS — S81852A Open bite, left lower leg, initial encounter: Secondary | ICD-10-CM | POA: Diagnosis not present

## 2021-06-22 DIAGNOSIS — E1151 Type 2 diabetes mellitus with diabetic peripheral angiopathy without gangrene: Secondary | ICD-10-CM | POA: Diagnosis not present

## 2021-06-22 DIAGNOSIS — I130 Hypertensive heart and chronic kidney disease with heart failure and stage 1 through stage 4 chronic kidney disease, or unspecified chronic kidney disease: Secondary | ICD-10-CM | POA: Diagnosis not present

## 2021-06-22 DIAGNOSIS — I890 Lymphedema, not elsewhere classified: Secondary | ICD-10-CM | POA: Diagnosis not present

## 2021-06-22 DIAGNOSIS — N1831 Chronic kidney disease, stage 3a: Secondary | ICD-10-CM | POA: Diagnosis not present

## 2021-06-23 ENCOUNTER — Telehealth: Payer: Self-pay | Admitting: *Deleted

## 2021-06-23 NOTE — Telephone Encounter (Signed)
   Telephone encounter was:  Successful.  06/23/2021 Name: BRYCESON GRAPE MRN: [id number] DOB: 26-Sep-1946  DERRY KASSEL is a 75 y.o. year old male who is a primary care patient of Darrick Huntsman, Mar Daring, MD . The community resource team was consulted for assistance with Transportation Needs   Care guide performed the following interventions: Patient provided with information about care guide support team and interviewed to confirm resource needs.  Follow Up Plan:  No further follow up planned at this time. The patient has been provided with needed resources.  Alois Cliche -[facility] Guide , Embedded Care Coordination [facility], Care Management  [phone] [address] [place] , [place] [address] Email : [email or web address]

## 2021-06-24 ENCOUNTER — Telehealth: Payer: Self-pay

## 2021-06-24 DIAGNOSIS — E1122 Type 2 diabetes mellitus with diabetic chronic kidney disease: Secondary | ICD-10-CM | POA: Diagnosis not present

## 2021-06-24 DIAGNOSIS — I872 Venous insufficiency (chronic) (peripheral): Secondary | ICD-10-CM | POA: Diagnosis not present

## 2021-06-24 DIAGNOSIS — I130 Hypertensive heart and chronic kidney disease with heart failure and stage 1 through stage 4 chronic kidney disease, or unspecified chronic kidney disease: Secondary | ICD-10-CM | POA: Diagnosis not present

## 2021-06-24 DIAGNOSIS — E1151 Type 2 diabetes mellitus with diabetic peripheral angiopathy without gangrene: Secondary | ICD-10-CM | POA: Diagnosis not present

## 2021-06-24 DIAGNOSIS — S81852A Open bite, left lower leg, initial encounter: Secondary | ICD-10-CM | POA: Diagnosis not present

## 2021-06-24 DIAGNOSIS — I890 Lymphedema, not elsewhere classified: Secondary | ICD-10-CM | POA: Diagnosis not present

## 2021-06-24 DIAGNOSIS — L03116 Cellulitis of left lower limb: Secondary | ICD-10-CM | POA: Diagnosis not present

## 2021-06-24 DIAGNOSIS — N1831 Chronic kidney disease, stage 3a: Secondary | ICD-10-CM | POA: Diagnosis not present

## 2021-06-24 DIAGNOSIS — I5032 Chronic diastolic (congestive) heart failure: Secondary | ICD-10-CM | POA: Diagnosis not present

## 2021-06-24 NOTE — Telephone Encounter (Signed)
NO ,  HIS OXYCODONE HAS BEEN STOPPED BECAUSE OF HIS RECENT ER VISIT WHEN HE HAD A DRUG OVERDOSE .    WE HAVE TRIED TO CONTACT HIM WITH THIS INFORMATION STARTING ON June 22 AND HE HAS NOT RETURNED CALLS

## 2021-06-24 NOTE — Telephone Encounter (Signed)
Boneta Lucks with [facility] called and states that pt is at the pharmacy and thought he could get his oxycodone today. She states that it is not due until tomorrow and wanted permission from Dr Darrick Huntsman to fill because pt told her that it is hard for him to get a ride.

## 2021-06-28 ENCOUNTER — Ambulatory Visit: Payer: Medicare HMO | Admitting: Pharmacist

## 2021-06-28 DIAGNOSIS — I25118 Atherosclerotic heart disease of native coronary artery with other forms of angina pectoris: Secondary | ICD-10-CM

## 2021-06-28 DIAGNOSIS — I10 Essential (primary) hypertension: Secondary | ICD-10-CM

## 2021-06-28 DIAGNOSIS — E1151 Type 2 diabetes mellitus with diabetic peripheral angiopathy without gangrene: Secondary | ICD-10-CM | POA: Diagnosis not present

## 2021-06-28 DIAGNOSIS — E1122 Type 2 diabetes mellitus with diabetic chronic kidney disease: Secondary | ICD-10-CM | POA: Diagnosis not present

## 2021-06-28 DIAGNOSIS — E782 Mixed hyperlipidemia: Secondary | ICD-10-CM

## 2021-06-28 DIAGNOSIS — J431 Panlobular emphysema: Secondary | ICD-10-CM

## 2021-06-28 DIAGNOSIS — I872 Venous insufficiency (chronic) (peripheral): Secondary | ICD-10-CM | POA: Diagnosis not present

## 2021-06-28 DIAGNOSIS — I130 Hypertensive heart and chronic kidney disease with heart failure and stage 1 through stage 4 chronic kidney disease, or unspecified chronic kidney disease: Secondary | ICD-10-CM | POA: Diagnosis not present

## 2021-06-28 DIAGNOSIS — I4819 Other persistent atrial fibrillation: Secondary | ICD-10-CM

## 2021-06-28 DIAGNOSIS — S81852A Open bite, left lower leg, initial encounter: Secondary | ICD-10-CM | POA: Diagnosis not present

## 2021-06-28 DIAGNOSIS — I890 Lymphedema, not elsewhere classified: Secondary | ICD-10-CM | POA: Diagnosis not present

## 2021-06-28 DIAGNOSIS — N1831 Chronic kidney disease, stage 3a: Secondary | ICD-10-CM | POA: Diagnosis not present

## 2021-06-28 DIAGNOSIS — I5032 Chronic diastolic (congestive) heart failure: Secondary | ICD-10-CM | POA: Diagnosis not present

## 2021-06-28 DIAGNOSIS — I5030 Unspecified diastolic (congestive) heart failure: Secondary | ICD-10-CM

## 2021-06-28 DIAGNOSIS — L03116 Cellulitis of left lower limb: Secondary | ICD-10-CM | POA: Diagnosis not present

## 2021-06-28 MED ORDER — RIVAROXABAN 20 MG PO TABS
20.0000 mg | ORAL_TABLET | Freq: Every day | ORAL | 3 refills | Status: DC
Start: 2021-06-28 — End: 2021-09-28

## 2021-06-28 MED ORDER — RIVAROXABAN 15 MG PO TABS
15.0000 mg | ORAL_TABLET | Freq: Every day | ORAL | 2 refills | Status: DC
Start: 2021-06-28 — End: 2021-06-28

## 2021-06-28 NOTE — Chronic Care Management (AMB) (Addendum)
Chronic Care Management Pharmacy Note  06/28/2021 Name:  Victor Daniels MRN:  [id number] DOB:  Nov 02, 1946   Subjective: Victor Daniels is an 75 y.o. year old male who is a primary patient of Tullo, Aris Everts, MD.  The CCM team was consulted for assistance with disease management and care coordination needs.    Engaged with patient by telephone for follow up visit in response to provider referral for pharmacy case management and/or care coordination services.   Consent to Services:  The patient was given information about Chronic Care Management services, agreed to services, and gave verbal consent prior to initiation of services.  Please see initial visit note for detailed documentation.   Patient Care Team: Crecencio Mc, MD as PCP - General (Internal Medicine) Rockey Situ Kathlene November, MD as PCP - Cardiology (Cardiology) Crecencio Mc, MD as Referring Physician (Internal Medicine) Lucky Cowboy Erskine Squibb, MD as Surgeon (Surgery) Wilhelmina Mcardle, MD (Inactive) as Consulting Physician (Pulmonary Disease) De Hollingshead, RPH-CPP as Pharmacist (Pharmacist) Leona Singleton, RN as Case Manager   Objective:  Lab Results  Component Value Date   CREATININE 2.11 (H) 06/10/2021   CREATININE 1.04 05/31/2021   CREATININE 1.15 05/30/2021    Lab Results  Component Value Date   HGBA1C 6.1 (H) 02/11/2021   Last diabetic Eye exam:  Lab Results  Component Value Date/Time   HMDIABEYEEXA No Retinopathy 08/14/2017 12:00 AM    Last diabetic Foot exam: No results found for: HMDIABFOOTEX      Component Value Date/Time   CHOL 103 02/11/2021 1430   TRIG 54 02/11/2021 1430   HDL 37 (L) 02/11/2021 1430   CHOLHDL 2.8 02/11/2021 1430   VLDL 20.8 03/15/2020 1051   LDLCALC 53 02/11/2021 1430   LDLDIRECT 72.0 12/01/2016 1459    Hepatic Function Latest Ref Rng & Units 06/10/2021 05/29/2021 05/26/2021  Total Protein 6.5 - 8.1 g/dL 8.0 8.0 7.4  Albumin 3.5 - 5.0 g/dL 2.5(L) 2.3(L) 2.6(L)   AST 15 - 41 U/L _0 ALT 0 - 44 U/L _1 Alk Phosphatase 38 - 126 U/L 101 82 100  Total Bilirubin 0.3 - 1.2 mg/dL 0.7 1.3(H) 0.8    Lab Results  Component Value Date/Time   TSH 1.84 05/26/2021 11:50 AM   TSH 2.088 06/23/2020 05:26 AM   TSH 1.52 03/15/2020 10:51 AM   FREET4 0.84 06/09/2013 12:05 PM    CBC Latest Ref Rng & Units 06/10/2021 05/31/2021 05/30/2021  WBC 4.0 - 10.5 K/uL 6.8 7.8 8.5  Hemoglobin 13.0 - 17.0 g/dL 8.9(L) 9.9(L) 9.8(L)  Hematocrit 39.0 - 52.0 % 29.9(L) 32.2(L) 32.2(L)  Platelets 150 - 400 K/uL 275 195 193    Lab Results  Component Value Date/Time   VD25OH 21.04 (L) 02/23/2016 02:05 PM   VD25OH 31.68 06/25/2015 09:38 AM    Clinical ASCVD: Yes  The ASCVD Risk score Mikey Bussing DC Jr., et al., 2013) failed to calculate for the following reasons:   The patient has a prior MI or stroke diagnosis     CHA2DS2-VASc Score = 6  This indicates a 9.7% annual risk of stroke. The patient's score is based upon: CHF History: Yes HTN History: Yes Diabetes History: No Stroke History: Yes Vascular Disease History: Yes Age Score: 1 Gender Score: 0    Social History   Tobacco Use  Smoking Status Former   Packs/day: 2.00   Years: 25.00   Pack years: 50.00   Types: Cigarettes  Quit date: 09/26/2009   Years since quitting: 11.7  Smokeless Tobacco Never   BP Readings from Last 3 Encounters:  06/10/21 (!) 141/80  05/31/21 (!) 145/88  05/26/21 (!) 142/66   Pulse Readings from Last 3 Encounters:  06/10/21 74  05/31/21 97  05/26/21 93   Wt Readings from Last 3 Encounters:  06/10/21 217 lb 13 oz (98.8 kg)  05/30/21 217 lb 13 oz (98.8 kg)  05/26/21 240 lb (108.9 kg)    Assessment: Review of patient past medical history, allergies, medications, health status, including review of consultants reports, laboratory and other test data, was performed as part of comprehensive evaluation and provision of chronic care management services.   SDOH:  (Social  Determinants of Health) assessments and interventions performed: none today   CCM Care Plan  Allergies  Allergen Reactions   Clonidine Derivatives     Reaction unknown   Sulfa Antibiotics     Reaction unknown    Medications Reviewed Today     Reviewed by De Hollingshead, RPH-CPP (Pharmacist) on 06/28/21 at 1200  Med List Status: <None>   Medication Order Taking? Sig Documenting Provider Last Dose Status Informant  albuterol (PROVENTIL) (2.5 MG/3ML) 0.083% nebulizer solution [id number] Yes Take 3 mLs (2.5 mg total) by nebulization every 6 (six) hours as needed for wheezing or shortness of breath. Loletha Grayer, MD Taking Active Self  Fluticasone-Umeclidin-Vilant (TRELEGY ELLIPTA) 100-62.5-25 MCG/INH AEPB [id number] Yes Inhale 1 puff into the lungs daily. Crecencio Mc, MD Taking Active Self  furosemide (LASIX) 40 MG tablet [id number] Yes Take 1 tablet (40 mg total) by mouth 2 (two) times daily. McLean-Scocuzza, Nino Glow, MD Taking Active Self  ipratropium-albuterol (DUONEB) 0.5-2.5 (3) MG/3ML SOLN [id number] Yes USE 1 VIAL IN NEBULIZER EVERY 6 HOURS AS NEEDED FOR WHEEZING Crecencio Mc, MD Taking Active Self  losartan (COZAAR) 25 MG tablet [id number]  Take 1 tablet (25 mg total) by mouth daily. Crecencio Mc, MD  Active Self  mupirocin ointment (BACTROBAN) 2 % [id number]  Apply 1 application topically 2 (two) times daily. McLean-Scocuzza, Nino Glow, MD  Active Self  nitroGLYCERIN (NITROSTAT) 0.4 MG SL tablet [id number] No Place 1 tablet (0.4 mg total) under the tongue every 5 (five) minutes as needed for chest pain. Maximum dose 3 tablets  Patient not taking: Reported on 06/28/2021   Crecencio Mc, MD Not Taking Active Self           Med Note ([place] Jun 28, 2021 11:57 AM)    OXYGEN [id number] Yes Inhale 2 L into the lungs 3 (three) times daily as needed (shortness of breath).  [provider] Taking Active Self  Rivaroxaban (XARELTO) 15 MG TABS tablet  [id number] Yes Take 1 tablet (15 mg total) by mouth daily with supper. Crecencio Mc, MD  Active   simvastatin (ZOCOR) 20 MG tablet [id number] Yes Take 1 tablet (20 mg total) by mouth at bedtime. Crecencio Mc, MD Taking Active Self  spironolactone (ALDACTONE) 50 MG tablet [id number] Yes Take 1 tablet (50 mg total) by mouth daily. In am McLean-Scocuzza, Nino Glow, MD Taking Active Self  traZODone (DESYREL) 50 MG tablet [id number] Yes Take 1 tablet (50 mg total) by mouth at bedtime. Crecencio Mc, MD Taking Active Self           Med Note Nat Christen Apr 14, 2021  1:38 PM) Taking PRN  VENTOLIN HFA 108 (90 Base) MCG/ACT inhaler [id number]  Yes INHALE 2 PUFFS BY MOUTH EVERY 6 HOURS AS NEEDED FOR WHEEZING OR SHORTNESS OF BREATH Crecencio Mc, MD Taking Active Self  vitamin B-12 (CYANOCOBALAMIN) 1000 MCG tablet [id number] Yes Take 1,000 mcg by mouth daily. [provider] Taking Active Self            Patient Active Problem List   Diagnosis Date Noted   Accidental drug overdose 06/14/2021   Cellulitis of left lower extremity 05/29/2021   Wound infection 05/29/2021   Chronic diastolic CHF (congestive heart failure) ([place]) 05/29/2021   Hypokalemia 05/29/2021   Lymphedema 05/26/2021   Abnormal gait 05/26/2021   Insomnia due to psychological stress 03/26/2021   Failure to thrive syndrome, adult 03/23/2021   DVT (deep venous thrombosis) (HCC)    Leukocytosis    Hematuria, gross 07/19/2020   Acquired thrombophilia ([place]) 07/04/2020   Atrial fibrillation ([place])    CKD (chronic kidney disease), stage IIIa 03/30/2020   Degenerative joint disease of shoulder region 09/26/2018   Obstructive sleep apnea 11/22/2016   Hypogonadism male 07/07/2016   Obesity 06/03/2016   Reduced libido 05/11/2016   Visit for preventive health examination 03/01/2016   Diabetes mellitus with circulatory complication ([place]) 45/80/9983   Shoulder pain, left 08/03/2015   Pneumonia 38/25/0539    Marital conflict involving estrangement 04/13/2015   Cervical radiculopathy due to degenerative joint disease of spine 03/24/2015   Iron deficiency anemia 02/25/2015   Generalized weakness 02/25/2015   Nocturia 02/25/2015   Pernicious anemia 01/14/2015   S/P hip replacement 12/12/2014   S/p nephrectomy 09/07/2014   Vertigo, central 08/20/2014   Senile purpura ([place]) 06/28/2014   Incomplete emptying of bladder 10/08/2013   Anemia 09/11/2013   Chest pain with high risk for cardiac etiology 04/08/2013   Benign localized prostatic hyperplasia with lower urinary tract symptoms (LUTS) 10/23/2012   Sciatica 10/23/2012   Venous insufficiency (chronic) (peripheral) 07/08/2012   Venous insufficiency    History of alcohol abuse    Vasomotor rhinitis 12/06/2011   CAD (coronary artery disease) 10/12/2011   COPD (chronic obstructive pulmonary disease) ([place])    CHF with left ventricular diastolic dysfunction, NYHA class 2 ([place])    Hyperlipidemia    Hypertension     Immunization History  Administered Date(s) Administered   Influenza Split 10/09/2011, 12/03/2012   Influenza, High Dose Seasonal PF 09/01/2016, 09/12/2017, 12/20/2018   Influenza,inj,Quad PF,6+ Mos 09/10/2013, 09/19/2014   PFIZER(Purple Top)SARS-COV-2 Vaccination 10/01/2020, 11/02/2020   Pneumococcal Conjugate-13 02/23/2016   Pneumococcal Polysaccharide-23 02/21/2010, 12/20/2018   Td 05/26/2021   Tdap 05/25/2010    Conditions to be addressed/monitored: Atrial Fibrillation, CHF, CAD, HTN, HLD, and COPD  Care Plan : PharmD - Medication Management  Updates made by De Hollingshead, RPH-CPP since 06/28/2021 12:00 AM     Problem: Heart Failure, COPD      Long-Range Goal: Disease Progression Prevention   This Visit's Progress: On track  Recent Progress: On track  Priority: High  Note:   Current Barriers:  Unable to independently monitor therapeutic efficacy Complex patient with multiple comorbidities including heart  failure, COPD  Pharmacist Clinical Goal(s):  Over the next 90 days, patient will maintain control of heart failure as evidenced by no unnecessary hospitalizations Over the next 90 days, patient will adhere to prescribed medication regimen.  Interventions: 1:1 collaboration with Crecencio Mc, MD regarding development and update of comprehensive plan of care as evidenced by provider attestation and co-signature Inter-disciplinary care team collaboration (see longitudinal plan of care) Comprehensive medication review  performed; medication list updated in electronic medical record  Health Maintenance: Receiving [facility] RN services and wound care.  Medication Management: Per documentation, [place] RN sat down with patient's neighbor, Vicente Serene and Vickii Chafe, and helped organize medications. Patient reports today that he has continued to use his pill box and that Vickii Chafe helps remind him daily.   Heart Failure (recovered, EF 55%), Atrial Fibrillation Uncontrolled due to medication nonadherence; current treatment: losartan 25 mg QPM, spironolactone 50 mg QAM, furosemide 40 mg QAM PRN (not taking evening dose) Anticoagulant regimen: Xarelto 20 mg QPM; most recent eGFR in ED was <50, however, dehydration suspected. Discussed with Dr. Derrel Nip. Order placed for CMP by home health tomorrow to evaluate renal function and determine if Xarelto needs to be dose reduced. This was not checked 1.5 weeks ago as ordered.  Sending script for Xarelto 15 mg QPM (appropriate renal dose reduction based on last available lab work) to avoid continued lack of treatment. Patient aware and will have someone to go [facility] today to pick up for him. Reviewed indication for preventing thrombolic event.  [place] clinical manager [place]. Discussed need for CMP to be drawn. She was going to contact the nurse going out today to have them check CMP. Also notified Colletta Maryland that we were sending the script for Xarelto today.  She verbalized understanding and will notify her nurse that is going out to see patient today ADDENDUM: Received call from [facility], clinical lead at [facility] that the CMP had been drawn, but just hadn't been linked to patient correctly. She is having the lab fax her results which she will then fax to [place], but she verbally provided me the renal function: Scr 1.13, eGFR 68 - will continue Xarelto 20 mg daily. [facility] to [facility], and contacted [facility] to provide clarification that 20 mg dose is what is intended. Colletta Maryland will notify the RN going out to the patient today to notify.   Hyperlipidemia and Secondary ASCVD Prevention - hx MI, CVA x2 Controlled per last lab work; current treatment: simvastatin 20 mg QPM No antiplatelet therapy d/t concurrent anticoagulant Recommended to continue current regimen at this time.   Chronic Obstructive Pulmonary Disease: Moderately well controlled; current treatment: Trelegy 100/62.5/25 mcg daily, albuterol HFA PRN or Duonebs TID; oxygen 24 hours; reports minimal need for rescue therapy lately Appointment with pulmonary scheduled tomorrow. Transportation scheduled via [employer] team through [place] benefits. Patient aware.   Chronic Pain: Uncontrolled; recent ED visit for oxycodone overdose. PCP cancelled oxycodone refills. Discussed this with patient. Assisted in scheduling appointment with PCP in next available appointment time.  Patient requested transportation support through [place] team. He does not remember how to schedule this himself. Placing referral for Care Guide team.   Insomnia: Moderately well controlled; current regimen: trazodone 25 mg QPM PRN, indicated improvement previously.  Previously recommend to continue current regimen at this time  Patient Goals/Self-Care Activities Over the next 90 days, patient will:  - take medications as prescribed weigh daily, and contact provider if weight gain of >3 lbs/day or 5  lbs/week collaborate with provider on medication access solutions  Follow Up Plan: Telephone follow up appointment with care management team member scheduled for: ~ 4 weeks     Medication Assistance: None required.  Patient affirms current coverage meets needs.  Patient's preferred pharmacy is:  [facility] - [address], [place] [address] Phone: [phone] Fax: [phone]  [facility] - [place], [place] [address] Phone: [phone] Fax: [phone]  [facility] [address]), [place] - [place]) [place] [address] Phone: [phone] Fax: [phone]  Uses pill box? Yes Pt endorses 100% compliance recently  Follow Up:  Patient agrees to Care Plan and Follow-up.  Plan: Telephone follow up appointment with care management team member scheduled for:  ~ 4 weeks  Catie Darnelle Maffucci, PharmD, [place], [place] Clinical Pharmacist [employer] at [employer] [phone]

## 2021-06-28 NOTE — Patient Instructions (Signed)
Mr. Nabers,   It was great talking to you today!  You should be taking:   Morning: - Losartan 25 mg  - Furosemide 40 mg - Spironolactone 50 mg   Trelegy inhaler - 1 puff every morning  Midafternoon: - Furosemide 40 mg  Evening: - Xarelto 15 mg (for now. We will see the results of your next lab work) - Simvastatin 20 mg   Trazodone AS NEEDED (not in pill box) for sleep.  Call me with any questions or concerns!  Catie Feliz Beam, PharmD [phone]    Visit Information  PATIENT GOALS:  Goals Addressed               This Visit's Progress     Patient Stated     PharmD - Medication Monitoring (pt-stated)        Patient Goals/Self-Care Activities Over the next 90 days, patient will:  - take medications as prescribed weigh daily, and contact provider if weight gain of >3 lbs/day or 5 lbs/week collaborate with provider on medication access solutions           The patient verbalized understanding of instructions, educational materials, and care plan provided today and agreed to receive a mailed copy of patient instructions, educational materials, and care plan.   Plan: Telephone follow up appointment with care management team member scheduled for:  ~ 4 weeks  Catie Feliz Beam, PharmD, [place], CPP Clinical Pharmacist [employer] at [employer] [phone]

## 2021-06-28 NOTE — Addendum Note (Signed)
Addended by: Lourena Simmonds on: 06/28/2021 01:51 PM   Modules accepted: Orders

## 2021-06-28 NOTE — Telephone Encounter (Signed)
Instructed pharmacy of the below. They have stopped the oxycodone per provider request.

## 2021-06-28 NOTE — Telephone Encounter (Signed)
Notified patient that oxycodone will not be prescribed until he has an appointment with PCP. Assisted patient in scheduling an appointment in next available slot on 07/27/21.   Also collaborated with Judeth Cornfield, clinical lead with [facility] regarding need for CMP and Xarelto. See CCM documentation for more detail.

## 2021-06-29 ENCOUNTER — Telehealth: Payer: Self-pay

## 2021-06-29 ENCOUNTER — Ambulatory Visit: Payer: Medicare HMO | Admitting: Primary Care

## 2021-06-29 DIAGNOSIS — N1831 Chronic kidney disease, stage 3a: Secondary | ICD-10-CM | POA: Diagnosis not present

## 2021-06-29 DIAGNOSIS — E1122 Type 2 diabetes mellitus with diabetic chronic kidney disease: Secondary | ICD-10-CM | POA: Diagnosis not present

## 2021-06-29 DIAGNOSIS — I130 Hypertensive heart and chronic kidney disease with heart failure and stage 1 through stage 4 chronic kidney disease, or unspecified chronic kidney disease: Secondary | ICD-10-CM | POA: Diagnosis not present

## 2021-06-29 DIAGNOSIS — I890 Lymphedema, not elsewhere classified: Secondary | ICD-10-CM | POA: Diagnosis not present

## 2021-06-29 DIAGNOSIS — S81852A Open bite, left lower leg, initial encounter: Secondary | ICD-10-CM | POA: Diagnosis not present

## 2021-06-29 DIAGNOSIS — I872 Venous insufficiency (chronic) (peripheral): Secondary | ICD-10-CM | POA: Diagnosis not present

## 2021-06-29 DIAGNOSIS — E1151 Type 2 diabetes mellitus with diabetic peripheral angiopathy without gangrene: Secondary | ICD-10-CM | POA: Diagnosis not present

## 2021-06-29 DIAGNOSIS — L03116 Cellulitis of left lower limb: Secondary | ICD-10-CM | POA: Diagnosis not present

## 2021-06-29 DIAGNOSIS — I5032 Chronic diastolic (congestive) heart failure: Secondary | ICD-10-CM | POA: Diagnosis not present

## 2021-06-29 NOTE — Telephone Encounter (Signed)
Patient last seen in 06/2018.  Patient will need to come in for visit vs telephone.   Lm to make patient aware.

## 2021-07-01 ENCOUNTER — Telehealth: Payer: Self-pay | Admitting: Internal Medicine

## 2021-07-01 DIAGNOSIS — S81852A Open bite, left lower leg, initial encounter: Secondary | ICD-10-CM | POA: Diagnosis not present

## 2021-07-01 DIAGNOSIS — I890 Lymphedema, not elsewhere classified: Secondary | ICD-10-CM | POA: Diagnosis not present

## 2021-07-01 DIAGNOSIS — N1831 Chronic kidney disease, stage 3a: Secondary | ICD-10-CM | POA: Diagnosis not present

## 2021-07-01 DIAGNOSIS — L03116 Cellulitis of left lower limb: Secondary | ICD-10-CM | POA: Diagnosis not present

## 2021-07-01 DIAGNOSIS — E1122 Type 2 diabetes mellitus with diabetic chronic kidney disease: Secondary | ICD-10-CM | POA: Diagnosis not present

## 2021-07-01 DIAGNOSIS — I130 Hypertensive heart and chronic kidney disease with heart failure and stage 1 through stage 4 chronic kidney disease, or unspecified chronic kidney disease: Secondary | ICD-10-CM | POA: Diagnosis not present

## 2021-07-01 DIAGNOSIS — I5032 Chronic diastolic (congestive) heart failure: Secondary | ICD-10-CM | POA: Diagnosis not present

## 2021-07-01 DIAGNOSIS — I872 Venous insufficiency (chronic) (peripheral): Secondary | ICD-10-CM | POA: Diagnosis not present

## 2021-07-01 DIAGNOSIS — E1151 Type 2 diabetes mellitus with diabetic peripheral angiopathy without gangrene: Secondary | ICD-10-CM | POA: Diagnosis not present

## 2021-07-01 NOTE — Telephone Encounter (Signed)
   Telephone encounter was:  Successful.  07/01/2021 Name: Victor Daniels MRN: [id number] DOB: September 20, 1946  Victor Daniels is a 75 y.o. year old male who is a primary care patient of Darrick Huntsman, Mar Daring, MD . The community resource team was consulted for assistance with Transportation Needs   Care guide performed the following interventions: Patient provided with information about care guide support team and interviewed to confirm resource needs.  Follow Up Plan:  No further follow up planned at this time. The patient has been provided with needed resources.Sent pt number to [facility] [phone] and instructed to call 3 business days prior to his 8/3 appt to schedule his ride.   April Green Care Guide, Embedded Care Coordination [facility], Care Management Phone: [phone] Email: [email or web address]

## 2021-07-02 DIAGNOSIS — E1122 Type 2 diabetes mellitus with diabetic chronic kidney disease: Secondary | ICD-10-CM | POA: Diagnosis not present

## 2021-07-02 DIAGNOSIS — I890 Lymphedema, not elsewhere classified: Secondary | ICD-10-CM | POA: Diagnosis not present

## 2021-07-02 DIAGNOSIS — S81852A Open bite, left lower leg, initial encounter: Secondary | ICD-10-CM | POA: Diagnosis not present

## 2021-07-02 DIAGNOSIS — I872 Venous insufficiency (chronic) (peripheral): Secondary | ICD-10-CM | POA: Diagnosis not present

## 2021-07-02 DIAGNOSIS — I5032 Chronic diastolic (congestive) heart failure: Secondary | ICD-10-CM | POA: Diagnosis not present

## 2021-07-02 DIAGNOSIS — N1831 Chronic kidney disease, stage 3a: Secondary | ICD-10-CM | POA: Diagnosis not present

## 2021-07-02 DIAGNOSIS — E1151 Type 2 diabetes mellitus with diabetic peripheral angiopathy without gangrene: Secondary | ICD-10-CM | POA: Diagnosis not present

## 2021-07-02 DIAGNOSIS — I130 Hypertensive heart and chronic kidney disease with heart failure and stage 1 through stage 4 chronic kidney disease, or unspecified chronic kidney disease: Secondary | ICD-10-CM | POA: Diagnosis not present

## 2021-07-02 DIAGNOSIS — L03116 Cellulitis of left lower limb: Secondary | ICD-10-CM | POA: Diagnosis not present

## 2021-07-03 DIAGNOSIS — E1122 Type 2 diabetes mellitus with diabetic chronic kidney disease: Secondary | ICD-10-CM | POA: Diagnosis not present

## 2021-07-03 DIAGNOSIS — I130 Hypertensive heart and chronic kidney disease with heart failure and stage 1 through stage 4 chronic kidney disease, or unspecified chronic kidney disease: Secondary | ICD-10-CM | POA: Diagnosis not present

## 2021-07-03 DIAGNOSIS — S81852A Open bite, left lower leg, initial encounter: Secondary | ICD-10-CM | POA: Diagnosis not present

## 2021-07-03 DIAGNOSIS — I890 Lymphedema, not elsewhere classified: Secondary | ICD-10-CM | POA: Diagnosis not present

## 2021-07-03 DIAGNOSIS — N1831 Chronic kidney disease, stage 3a: Secondary | ICD-10-CM | POA: Diagnosis not present

## 2021-07-03 DIAGNOSIS — L03116 Cellulitis of left lower limb: Secondary | ICD-10-CM | POA: Diagnosis not present

## 2021-07-03 DIAGNOSIS — E1151 Type 2 diabetes mellitus with diabetic peripheral angiopathy without gangrene: Secondary | ICD-10-CM | POA: Diagnosis not present

## 2021-07-03 DIAGNOSIS — I872 Venous insufficiency (chronic) (peripheral): Secondary | ICD-10-CM | POA: Diagnosis not present

## 2021-07-03 DIAGNOSIS — I5032 Chronic diastolic (congestive) heart failure: Secondary | ICD-10-CM | POA: Diagnosis not present

## 2021-07-04 DIAGNOSIS — I130 Hypertensive heart and chronic kidney disease with heart failure and stage 1 through stage 4 chronic kidney disease, or unspecified chronic kidney disease: Secondary | ICD-10-CM | POA: Diagnosis not present

## 2021-07-04 DIAGNOSIS — L03116 Cellulitis of left lower limb: Secondary | ICD-10-CM | POA: Diagnosis not present

## 2021-07-04 DIAGNOSIS — E1151 Type 2 diabetes mellitus with diabetic peripheral angiopathy without gangrene: Secondary | ICD-10-CM | POA: Diagnosis not present

## 2021-07-04 DIAGNOSIS — I5032 Chronic diastolic (congestive) heart failure: Secondary | ICD-10-CM | POA: Diagnosis not present

## 2021-07-04 DIAGNOSIS — E1122 Type 2 diabetes mellitus with diabetic chronic kidney disease: Secondary | ICD-10-CM | POA: Diagnosis not present

## 2021-07-04 DIAGNOSIS — I890 Lymphedema, not elsewhere classified: Secondary | ICD-10-CM | POA: Diagnosis not present

## 2021-07-04 DIAGNOSIS — I872 Venous insufficiency (chronic) (peripheral): Secondary | ICD-10-CM | POA: Diagnosis not present

## 2021-07-04 DIAGNOSIS — S81852A Open bite, left lower leg, initial encounter: Secondary | ICD-10-CM | POA: Diagnosis not present

## 2021-07-04 DIAGNOSIS — N1831 Chronic kidney disease, stage 3a: Secondary | ICD-10-CM | POA: Diagnosis not present

## 2021-07-05 DIAGNOSIS — I872 Venous insufficiency (chronic) (peripheral): Secondary | ICD-10-CM | POA: Diagnosis not present

## 2021-07-05 DIAGNOSIS — I5032 Chronic diastolic (congestive) heart failure: Secondary | ICD-10-CM | POA: Diagnosis not present

## 2021-07-05 DIAGNOSIS — N1831 Chronic kidney disease, stage 3a: Secondary | ICD-10-CM | POA: Diagnosis not present

## 2021-07-05 DIAGNOSIS — I130 Hypertensive heart and chronic kidney disease with heart failure and stage 1 through stage 4 chronic kidney disease, or unspecified chronic kidney disease: Secondary | ICD-10-CM | POA: Diagnosis not present

## 2021-07-05 DIAGNOSIS — E1122 Type 2 diabetes mellitus with diabetic chronic kidney disease: Secondary | ICD-10-CM | POA: Diagnosis not present

## 2021-07-05 DIAGNOSIS — S81852A Open bite, left lower leg, initial encounter: Secondary | ICD-10-CM | POA: Diagnosis not present

## 2021-07-05 DIAGNOSIS — E1151 Type 2 diabetes mellitus with diabetic peripheral angiopathy without gangrene: Secondary | ICD-10-CM | POA: Diagnosis not present

## 2021-07-05 DIAGNOSIS — L03116 Cellulitis of left lower limb: Secondary | ICD-10-CM | POA: Diagnosis not present

## 2021-07-05 DIAGNOSIS — I890 Lymphedema, not elsewhere classified: Secondary | ICD-10-CM | POA: Diagnosis not present

## 2021-07-08 DIAGNOSIS — I872 Venous insufficiency (chronic) (peripheral): Secondary | ICD-10-CM | POA: Diagnosis not present

## 2021-07-08 DIAGNOSIS — I5032 Chronic diastolic (congestive) heart failure: Secondary | ICD-10-CM | POA: Diagnosis not present

## 2021-07-08 DIAGNOSIS — L03116 Cellulitis of left lower limb: Secondary | ICD-10-CM | POA: Diagnosis not present

## 2021-07-08 DIAGNOSIS — E1122 Type 2 diabetes mellitus with diabetic chronic kidney disease: Secondary | ICD-10-CM | POA: Diagnosis not present

## 2021-07-08 DIAGNOSIS — S81852A Open bite, left lower leg, initial encounter: Secondary | ICD-10-CM | POA: Diagnosis not present

## 2021-07-08 DIAGNOSIS — I890 Lymphedema, not elsewhere classified: Secondary | ICD-10-CM | POA: Diagnosis not present

## 2021-07-08 DIAGNOSIS — I130 Hypertensive heart and chronic kidney disease with heart failure and stage 1 through stage 4 chronic kidney disease, or unspecified chronic kidney disease: Secondary | ICD-10-CM | POA: Diagnosis not present

## 2021-07-08 DIAGNOSIS — E1151 Type 2 diabetes mellitus with diabetic peripheral angiopathy without gangrene: Secondary | ICD-10-CM | POA: Diagnosis not present

## 2021-07-08 DIAGNOSIS — N1831 Chronic kidney disease, stage 3a: Secondary | ICD-10-CM | POA: Diagnosis not present

## 2021-07-12 DIAGNOSIS — E1151 Type 2 diabetes mellitus with diabetic peripheral angiopathy without gangrene: Secondary | ICD-10-CM | POA: Diagnosis not present

## 2021-07-12 DIAGNOSIS — I5032 Chronic diastolic (congestive) heart failure: Secondary | ICD-10-CM | POA: Diagnosis not present

## 2021-07-12 DIAGNOSIS — N1831 Chronic kidney disease, stage 3a: Secondary | ICD-10-CM | POA: Diagnosis not present

## 2021-07-12 DIAGNOSIS — S81852A Open bite, left lower leg, initial encounter: Secondary | ICD-10-CM | POA: Diagnosis not present

## 2021-07-12 DIAGNOSIS — I890 Lymphedema, not elsewhere classified: Secondary | ICD-10-CM | POA: Diagnosis not present

## 2021-07-12 DIAGNOSIS — L03116 Cellulitis of left lower limb: Secondary | ICD-10-CM | POA: Diagnosis not present

## 2021-07-12 DIAGNOSIS — E1122 Type 2 diabetes mellitus with diabetic chronic kidney disease: Secondary | ICD-10-CM | POA: Diagnosis not present

## 2021-07-12 DIAGNOSIS — I130 Hypertensive heart and chronic kidney disease with heart failure and stage 1 through stage 4 chronic kidney disease, or unspecified chronic kidney disease: Secondary | ICD-10-CM | POA: Diagnosis not present

## 2021-07-12 DIAGNOSIS — I872 Venous insufficiency (chronic) (peripheral): Secondary | ICD-10-CM | POA: Diagnosis not present

## 2021-07-15 DIAGNOSIS — I130 Hypertensive heart and chronic kidney disease with heart failure and stage 1 through stage 4 chronic kidney disease, or unspecified chronic kidney disease: Secondary | ICD-10-CM | POA: Diagnosis not present

## 2021-07-15 DIAGNOSIS — E1151 Type 2 diabetes mellitus with diabetic peripheral angiopathy without gangrene: Secondary | ICD-10-CM | POA: Diagnosis not present

## 2021-07-15 DIAGNOSIS — N1831 Chronic kidney disease, stage 3a: Secondary | ICD-10-CM | POA: Diagnosis not present

## 2021-07-15 DIAGNOSIS — L03116 Cellulitis of left lower limb: Secondary | ICD-10-CM | POA: Diagnosis not present

## 2021-07-15 DIAGNOSIS — I5032 Chronic diastolic (congestive) heart failure: Secondary | ICD-10-CM | POA: Diagnosis not present

## 2021-07-15 DIAGNOSIS — I872 Venous insufficiency (chronic) (peripheral): Secondary | ICD-10-CM | POA: Diagnosis not present

## 2021-07-15 DIAGNOSIS — E1122 Type 2 diabetes mellitus with diabetic chronic kidney disease: Secondary | ICD-10-CM | POA: Diagnosis not present

## 2021-07-15 DIAGNOSIS — S81852A Open bite, left lower leg, initial encounter: Secondary | ICD-10-CM | POA: Diagnosis not present

## 2021-07-15 DIAGNOSIS — I890 Lymphedema, not elsewhere classified: Secondary | ICD-10-CM | POA: Diagnosis not present

## 2021-07-19 DIAGNOSIS — I872 Venous insufficiency (chronic) (peripheral): Secondary | ICD-10-CM | POA: Diagnosis not present

## 2021-07-19 DIAGNOSIS — S81852A Open bite, left lower leg, initial encounter: Secondary | ICD-10-CM | POA: Diagnosis not present

## 2021-07-19 DIAGNOSIS — E1151 Type 2 diabetes mellitus with diabetic peripheral angiopathy without gangrene: Secondary | ICD-10-CM | POA: Diagnosis not present

## 2021-07-19 DIAGNOSIS — E1122 Type 2 diabetes mellitus with diabetic chronic kidney disease: Secondary | ICD-10-CM | POA: Diagnosis not present

## 2021-07-19 DIAGNOSIS — N1831 Chronic kidney disease, stage 3a: Secondary | ICD-10-CM | POA: Diagnosis not present

## 2021-07-19 DIAGNOSIS — L03116 Cellulitis of left lower limb: Secondary | ICD-10-CM | POA: Diagnosis not present

## 2021-07-19 DIAGNOSIS — I5032 Chronic diastolic (congestive) heart failure: Secondary | ICD-10-CM | POA: Diagnosis not present

## 2021-07-19 DIAGNOSIS — I890 Lymphedema, not elsewhere classified: Secondary | ICD-10-CM | POA: Diagnosis not present

## 2021-07-19 DIAGNOSIS — I130 Hypertensive heart and chronic kidney disease with heart failure and stage 1 through stage 4 chronic kidney disease, or unspecified chronic kidney disease: Secondary | ICD-10-CM | POA: Diagnosis not present

## 2021-07-22 DIAGNOSIS — I890 Lymphedema, not elsewhere classified: Secondary | ICD-10-CM | POA: Diagnosis not present

## 2021-07-22 DIAGNOSIS — E1151 Type 2 diabetes mellitus with diabetic peripheral angiopathy without gangrene: Secondary | ICD-10-CM | POA: Diagnosis not present

## 2021-07-22 DIAGNOSIS — I130 Hypertensive heart and chronic kidney disease with heart failure and stage 1 through stage 4 chronic kidney disease, or unspecified chronic kidney disease: Secondary | ICD-10-CM | POA: Diagnosis not present

## 2021-07-22 DIAGNOSIS — E1122 Type 2 diabetes mellitus with diabetic chronic kidney disease: Secondary | ICD-10-CM | POA: Diagnosis not present

## 2021-07-22 DIAGNOSIS — N1831 Chronic kidney disease, stage 3a: Secondary | ICD-10-CM | POA: Diagnosis not present

## 2021-07-22 DIAGNOSIS — I5032 Chronic diastolic (congestive) heart failure: Secondary | ICD-10-CM | POA: Diagnosis not present

## 2021-07-22 DIAGNOSIS — S81852A Open bite, left lower leg, initial encounter: Secondary | ICD-10-CM | POA: Diagnosis not present

## 2021-07-22 DIAGNOSIS — I872 Venous insufficiency (chronic) (peripheral): Secondary | ICD-10-CM | POA: Diagnosis not present

## 2021-07-22 DIAGNOSIS — L03116 Cellulitis of left lower limb: Secondary | ICD-10-CM | POA: Diagnosis not present

## 2021-07-26 DIAGNOSIS — N1831 Chronic kidney disease, stage 3a: Secondary | ICD-10-CM | POA: Diagnosis not present

## 2021-07-26 DIAGNOSIS — L03116 Cellulitis of left lower limb: Secondary | ICD-10-CM | POA: Diagnosis not present

## 2021-07-26 DIAGNOSIS — I5032 Chronic diastolic (congestive) heart failure: Secondary | ICD-10-CM | POA: Diagnosis not present

## 2021-07-26 DIAGNOSIS — I890 Lymphedema, not elsewhere classified: Secondary | ICD-10-CM | POA: Diagnosis not present

## 2021-07-26 DIAGNOSIS — S81852A Open bite, left lower leg, initial encounter: Secondary | ICD-10-CM | POA: Diagnosis not present

## 2021-07-26 DIAGNOSIS — E1122 Type 2 diabetes mellitus with diabetic chronic kidney disease: Secondary | ICD-10-CM | POA: Diagnosis not present

## 2021-07-26 DIAGNOSIS — I872 Venous insufficiency (chronic) (peripheral): Secondary | ICD-10-CM | POA: Diagnosis not present

## 2021-07-26 DIAGNOSIS — I130 Hypertensive heart and chronic kidney disease with heart failure and stage 1 through stage 4 chronic kidney disease, or unspecified chronic kidney disease: Secondary | ICD-10-CM | POA: Diagnosis not present

## 2021-07-26 DIAGNOSIS — E1151 Type 2 diabetes mellitus with diabetic peripheral angiopathy without gangrene: Secondary | ICD-10-CM | POA: Diagnosis not present

## 2021-07-27 ENCOUNTER — Encounter: Payer: Self-pay | Admitting: Internal Medicine

## 2021-07-27 ENCOUNTER — Other Ambulatory Visit: Payer: Self-pay

## 2021-07-27 ENCOUNTER — Ambulatory Visit (INDEPENDENT_AMBULATORY_CARE_PROVIDER_SITE_OTHER): Payer: Medicare HMO | Admitting: Internal Medicine

## 2021-07-27 DIAGNOSIS — T50901D Poisoning by unspecified drugs, medicaments and biological substances, accidental (unintentional), subsequent encounter: Secondary | ICD-10-CM | POA: Diagnosis not present

## 2021-07-27 DIAGNOSIS — M4722 Other spondylosis with radiculopathy, cervical region: Secondary | ICD-10-CM

## 2021-07-27 MED ORDER — OXYCODONE HCL 15 MG PO TABS: 15.0000 mg | ORAL_TABLET | Freq: Four times a day (QID) | ORAL | 0 refills | Status: DC | PRN

## 2021-07-27 NOTE — Patient Instructions (Addendum)
Take your second dose of furosemide by 2 PM  NOT AT 8 PM   TALK TO YOUR SISTER ABOUT LIVING TOGETHER IN THE FUTURE   RETURN FOR A LAB APPT ON Friday AT 2:15  SKIP YOUR AFTERNOON DOSE OF FUROSEMIDE ON Friday

## 2021-07-27 NOTE — Progress Notes (Addendum)
Subjective:  Patient ID: Victor Daniels, male    DOB: [birth year]  Age: 75 y.o. MRN: [id number]  CC: Diagnoses of Cervical radiculopathy due to degenerative joint disease of spine and Accidental drug overdose, subsequent encounter were pertinent to this visit.  HPI Victor Daniels presents for ER follow up  for accidental  overdose (presumed).  He is accompanied by his neighbor Mr. Curtis/   This visit occurred during the SARS-CoV-2 public health emergency.  Safety protocols were in place, including screening questions prior to the visit, additional usage of staff PPE, and extensive cleaning of exam room while observing appropriate contact time as indicated for disinfecting solutions.   Ed has had a difficult time since his wife died of metastatic CA several months ago.  He was admitted to [facility] in early June after developing left leg cellullitis from a dog bite that had occurred one month prior.  He was clearly having difficulty managing without his wife, offered transfer to rehab but declined admission.  He has chronic neck, shoulder and back pain managed with oxycodone and was recently taken to the ER when his neighbor found him in his home naked and confused.  He was treated for dehydration and sent home with a  diagnosis of presumed accidental overdose .Since then he has had increased assistance from neighbors and friends  for his social needs.   He now has a neighbor counting out his pills and administering  them in cups ar various times , and has not had another episod of confusion.  The same neighbor is also bringing him home cooked meals twice daily .  However since I did not refill his oxycodone he  states that has not been sleeping well due to shoulder and neck pain  that is going untreated at night.  He also reports ongoing heel pain.  He remains very angry about the passing of his wife because he was not aware of her prognosis and felt that her doctors did nothing forert  strained relation her in the end.  From my recollection of prior conversations,  he and his wife had a very strained relationship and lived  separate lives though cohabiting. He states that his Beverely Low passed away " 3 weeks ago"  but this is incorrect .  He becomes quite angry when I attempt to test his memory of the events surrounding his ER visit as well as his ability to live independently.  He has refused grief counselling, and states that he has "thrown out"  the physical therapists that have come to the house.  He does state that he has a sister in [place] who is younger than him and may be interested in allowing him to live with her in the future if he can purchase her home, which she is apparently trying to sell.  H has lost 41 lbs since March 2022 (15%) but some of that was edema He states that the weight loss Is intentional and was advoated by his wife     Outpatient Medications Prior to Visit  Medication Sig Dispense Refill   albuterol (PROVENTIL) (2.5 MG/3ML) 0.083% nebulizer solution Take 3 mLs (2.5 mg total) by nebulization every 6 (six) hours as needed for wheezing or shortness of breath. 75 mL 0   furosemide (LASIX) 40 MG tablet Take 1 tablet (40 mg total) by mouth 2 (two) times daily. 60 tablet 1   ipratropium-albuterol (DUONEB) 0.5-2.5 (3) MG/3ML SOLN USE 1 VIAL IN NEBULIZER EVERY 6 HOURS AS  NEEDED FOR WHEEZING 360 mL 0   losartan (COZAAR) 25 MG tablet Take 1 tablet (25 mg total) by mouth daily. 90 tablet 1   mupirocin ointment (BACTROBAN) 2 % Apply 1 application topically 2 (two) times daily. 30 g 2   nitroGLYCERIN (NITROSTAT) 0.4 MG SL tablet Place 1 tablet (0.4 mg total) under the tongue every 5 (five) minutes as needed for chest pain. Maximum dose 3 tablets 50 tablet 3   OXYGEN Inhale 2 L into the lungs 3 (three) times daily as needed (shortness of breath).      rivaroxaban (XARELTO) 20 MG TABS tablet Take 1 tablet (20 mg total) by mouth daily with supper. 90 tablet 3    simvastatin (ZOCOR) 20 MG tablet Take 1 tablet (20 mg total) by mouth at bedtime. 90 tablet 1   spironolactone (ALDACTONE) 50 MG tablet Take 1 tablet (50 mg total) by mouth daily. In am 90 tablet 1   VENTOLIN HFA 108 (90 Base) MCG/ACT inhaler INHALE 2 PUFFS BY MOUTH EVERY 6 HOURS AS NEEDED FOR WHEEZING OR SHORTNESS OF BREATH 18 g 0   vitamin B-12 (CYANOCOBALAMIN) 1000 MCG tablet Take 1,000 mcg by mouth daily.     Fluticasone-Umeclidin-Vilant (TRELEGY ELLIPTA) 100-62.5-25 MCG/INH AEPB Inhale 1 puff into the lungs daily. 3 each 1   traZODone (DESYREL) 50 MG tablet Take 1 tablet (50 mg total) by mouth at bedtime. (Patient not taking: Reported on 07/27/2021) 90 tablet 0   No facility-administered medications prior to visit.    Review of Systems;  Patient denies headache, fevers, malaise, unintentional weight loss, skin rash, eye pain, sinus congestion and sinus pain, sore throat, dysphagia,  hemoptysis , cough, dyspnea, wheezing, chest pain, palpitations, orthopnea, edema, abdominal pain, nausea, melena, diarrhea, constipation, flank pain, dysuria, hematuria, urinary  Frequency, nocturia, numbness, tingling, seizures,  Focal weakness, Loss of consciousness,  Tremor, insomnia, depression, anxiety, and suicidal ideation.      Objective:  BP (!) 100/58 (BP Location: Left Arm, Patient Position: Sitting, Cuff Size: Large)   Pulse (!) 101   Temp 99.7 F (37.6 C) (Oral)   Ht 6' (1.829 m)   Wt 227 lb (103 kg)   SpO2 90%   BMI 30.79 kg/m   BP Readings from Last 3 Encounters:  07/27/21 (!) 100/58  06/10/21 (!) 141/80  05/31/21 (!) 145/88    Wt Readings from Last 3 Encounters:  07/27/21 227 lb (103 kg)  06/10/21 217 lb 13 oz (98.8 kg)  05/30/21 217 lb 13 oz (98.8 kg)    General appearance: alert, cooperative and appears stated age Ears: normal TM's and external ear canals both ears Throat: lips, mucosa, and tongue normal; teeth and gums normal Neck: no adenopathy, no carotid bruit, supple,  symmetrical, trachea midline and thyroid not enlarged, symmetric, no tenderness/mass/nodules Back: symmetric, no curvature. ROM normal. No CVA tenderness. Lungs: clear to auscultation bilaterally Heart: regular rate and rhythm, S1, S2 normal, no murmur, click, rub or gallop Abdomen: soft, non-tender; bowel sounds normal; no masses,  no organomegaly Pulses: 2+ and symmetric Skin: Brawny skin  changes , or normal. No rashes or lesions Lymph nodes: Cervical, supraclavicular, and axillary nodes normal.  Lab Results  Component Value Date   HGBA1C 6.1 (H) 02/11/2021   HGBA1C 6.3 (H) 06/23/2020   HGBA1C 6.6 (H) 03/15/2020    Lab Results  Component Value Date   CREATININE 1.71 (H) 07/29/2021   CREATININE 2.11 (H) 06/10/2021   CREATININE 1.04 05/31/2021    Lab Results  Component Value Date   WBC 6.8 06/10/2021   HGB 8.9 (L) 06/10/2021   HCT 29.9 (L) 06/10/2021   PLT 275 06/10/2021   GLUCOSE 94 07/29/2021   CHOL 103 02/11/2021   TRIG 54 02/11/2021   HDL 37 (L) 02/11/2021   LDLDIRECT 72.0 12/01/2016   LDLCALC 53 02/11/2021   ALT 15 07/29/2021   AST 21 07/29/2021   NA 135 07/29/2021   K 5.0 07/29/2021   CL 97 (L) 07/29/2021   CREATININE 1.71 (H) 07/29/2021   BUN 47 (H) 07/29/2021   CO2 27 07/29/2021   TSH 2.16 07/29/2021   PSA 0.29 02/11/2021   INR 1.3 (H) 07/23/2020   HGBA1C 6.1 (H) 02/11/2021   MICROALBUR 0.5 02/11/2021    No results found.  Assessment & Plan:   Problem List Items Addressed This Visit       Unprioritized   Cervical radiculopathy due to degenerative joint disease of spine     He has neck pain, shoulder pain and  bilateral hand numbness aggravated by arthritis.Marland Kitchen    He has required use of oxycodone 15 mg every 8 hours prn due to CKD, His recent ER visit for presumed accidental overdose was discussed today . He has been in considerable pain since refills were denied but now has assistance with is medications by a neighbor  who administers them in small  cups.   Refill for one month  As a trial        Relevant Medications   oxyCODONE (ROXICODONE) 15 MG immediate release tablet   Accidental drug overdose    I have reinstated a 30 day  Refill of oxycodone for patient based on his need for pain management and his supervised administration.        Meds ordered this encounter  Medications   oxyCODONE (ROXICODONE) 15 MG immediate release tablet    Sig: Take 1 tablet (15 mg total) by mouth every 6 (six) hours as needed.    Dispense:  120 tablet    Refill:  0   I provided  30 minutes of  face-to-face time during this encounter reviewing patient's current problems and ecent ER visits , providing counseling on the above mentioned problems , and coordination  of care .    There are no discontinued medications.  Follow-up: No follow-ups on file.   [name], MD

## 2021-07-29 ENCOUNTER — Ambulatory Visit: Payer: Medicare HMO | Admitting: Pharmacist

## 2021-07-29 ENCOUNTER — Other Ambulatory Visit (INDEPENDENT_AMBULATORY_CARE_PROVIDER_SITE_OTHER): Payer: Medicare HMO

## 2021-07-29 ENCOUNTER — Other Ambulatory Visit: Payer: Self-pay

## 2021-07-29 ENCOUNTER — Telehealth: Payer: Self-pay | Admitting: *Deleted

## 2021-07-29 DIAGNOSIS — E1122 Type 2 diabetes mellitus with diabetic chronic kidney disease: Secondary | ICD-10-CM | POA: Diagnosis not present

## 2021-07-29 DIAGNOSIS — I10 Essential (primary) hypertension: Secondary | ICD-10-CM

## 2021-07-29 DIAGNOSIS — I890 Lymphedema, not elsewhere classified: Secondary | ICD-10-CM | POA: Diagnosis not present

## 2021-07-29 DIAGNOSIS — L03116 Cellulitis of left lower limb: Secondary | ICD-10-CM | POA: Diagnosis not present

## 2021-07-29 DIAGNOSIS — I872 Venous insufficiency (chronic) (peripheral): Secondary | ICD-10-CM | POA: Diagnosis not present

## 2021-07-29 DIAGNOSIS — E782 Mixed hyperlipidemia: Secondary | ICD-10-CM

## 2021-07-29 DIAGNOSIS — I25118 Atherosclerotic heart disease of native coronary artery with other forms of angina pectoris: Secondary | ICD-10-CM

## 2021-07-29 DIAGNOSIS — N1831 Chronic kidney disease, stage 3a: Secondary | ICD-10-CM | POA: Diagnosis not present

## 2021-07-29 DIAGNOSIS — N179 Acute kidney failure, unspecified: Secondary | ICD-10-CM

## 2021-07-29 DIAGNOSIS — S81852A Open bite, left lower leg, initial encounter: Secondary | ICD-10-CM | POA: Diagnosis not present

## 2021-07-29 DIAGNOSIS — E1151 Type 2 diabetes mellitus with diabetic peripheral angiopathy without gangrene: Secondary | ICD-10-CM | POA: Diagnosis not present

## 2021-07-29 DIAGNOSIS — I5030 Unspecified diastolic (congestive) heart failure: Secondary | ICD-10-CM

## 2021-07-29 DIAGNOSIS — I130 Hypertensive heart and chronic kidney disease with heart failure and stage 1 through stage 4 chronic kidney disease, or unspecified chronic kidney disease: Secondary | ICD-10-CM | POA: Diagnosis not present

## 2021-07-29 DIAGNOSIS — J431 Panlobular emphysema: Secondary | ICD-10-CM

## 2021-07-29 DIAGNOSIS — I5032 Chronic diastolic (congestive) heart failure: Secondary | ICD-10-CM | POA: Diagnosis not present

## 2021-07-29 MED ORDER — TRELEGY ELLIPTA 100-62.5-25 MCG/INH IN AEPB: 1.0000 | INHALATION_SPRAY | Freq: Every day | RESPIRATORY_TRACT | 3 refills | Status: DC

## 2021-07-29 NOTE — Patient Instructions (Signed)
Victor Daniels and Victor Daniels,   Keep up the great work with organizing your medications! This is how you should be taking everything:    Morning: - Losartan 25 mg - Furosemide 40 mg - Spironolactone 50 mg   Trelegy inhaler - 1 puff every morning   Midafternoon (around 1-2 pm) - Furosemide 40 mg   Evening: - Xarelto 20 mg (for now. We will see the results of your next lab work) - Simvastatin 20 mg   Trazodone AS NEEDED (not in pill box) for sleep.  Call me with any future questions or concerns!  Catie Feliz Beam, PharmD [phone]    Visit Information  PATIENT GOALS:  Goals Addressed               This Visit's Progress     Patient Stated     PharmD - Medication Monitoring (pt-stated)        Patient Goals/Self-Care Activities Over the next 90 days, patient will:  - take medications as prescribed weigh daily, and contact provider if weight gain of >3 lbs/day or 5 lbs/week collaborate with provider on medication access solutions        Patient verbalizes understanding of instructions provided today and agrees to view in MyChart.   Plan: Telephone follow up appointment with care management team member scheduled for:  patient declines to schedule at this time   Catie Feliz Beam, PharmD, [place], CPP Clinical Pharmacist [employer] at [facility] [phone]

## 2021-07-29 NOTE — Chronic Care Management (AMB) (Addendum)
Chronic Care Management Pharmacy Note  07/29/2021 Name:  Victor Daniels MRN:  [id number] DOB:  09-08-1946   Subjective: Victor Daniels is an 75 y.o. year old male who is a primary patient of Tullo, Aris Everts, MD.  The CCM team was consulted for assistance with disease management and care coordination needs.    Engaged with patient by telephone for follow up visit in response to provider referral for pharmacy case management and/or care coordination services.   Consent to Services:  The patient was given information about Chronic Care Management services, agreed to services, and gave verbal consent prior to initiation of services.  Please see initial visit note for detailed documentation.   Patient Care Team: Crecencio Mc, MD as PCP - General (Internal Medicine) Rockey Situ Kathlene November, MD as PCP - Cardiology (Cardiology) Crecencio Mc, MD as Referring Physician (Internal Medicine) Lucky Cowboy Erskine Squibb, MD as Surgeon (Surgery) Wilhelmina Mcardle, MD (Inactive) as Consulting Physician (Pulmonary Disease) De Hollingshead, RPH-CPP as Pharmacist (Pharmacist) Leona Singleton, RN as Case Manager   Objective:  Lab Results  Component Value Date   CREATININE 2.11 (H) 06/10/2021   CREATININE 1.04 05/31/2021   CREATININE 1.15 05/30/2021    Lab Results  Component Value Date   HGBA1C 6.1 (H) 02/11/2021   Last diabetic Eye exam:  Lab Results  Component Value Date/Time   HMDIABEYEEXA No Retinopathy 08/14/2017 12:00 AM    Last diabetic Foot exam: No results found for: HMDIABFOOTEX      Component Value Date/Time   CHOL 103 02/11/2021 1430   TRIG 54 02/11/2021 1430   HDL 37 (L) 02/11/2021 1430   CHOLHDL 2.8 02/11/2021 1430   VLDL 20.8 03/15/2020 1051   LDLCALC 53 02/11/2021 1430   LDLDIRECT 72.0 12/01/2016 1459    Hepatic Function Latest Ref Rng & Units 06/10/2021 05/29/2021 05/26/2021  Total Protein 6.5 - 8.1 g/dL 8.0 8.0 7.4  Albumin 3.5 - 5.0 g/dL 2.5(L) 2.3(L) 2.6(L)   AST 15 - 41 U/L _0 ALT 0 - 44 U/L _1 Alk Phosphatase 38 - 126 U/L 101 82 100  Total Bilirubin 0.3 - 1.2 mg/dL 0.7 1.3(H) 0.8    Lab Results  Component Value Date/Time   TSH 1.84 05/26/2021 11:50 AM   TSH 2.088 06/23/2020 05:26 AM   TSH 1.52 03/15/2020 10:51 AM   FREET4 0.84 06/09/2013 12:05 PM    CBC Latest Ref Rng & Units 06/10/2021 05/31/2021 05/30/2021  WBC 4.0 - 10.5 K/uL 6.8 7.8 8.5  Hemoglobin 13.0 - 17.0 g/dL 8.9(L) 9.9(L) 9.8(L)  Hematocrit 39.0 - 52.0 % 29.9(L) 32.2(L) 32.2(L)  Platelets 150 - 400 K/uL 275 195 193    Lab Results  Component Value Date/Time   VD25OH 21.04 (L) 02/23/2016 02:05 PM   VD25OH 31.68 06/25/2015 09:38 AM    Clinical ASCVD: Yes  The ASCVD Risk score Mikey Bussing DC Jr., et al., 2013) failed to calculate for the following reasons:   The patient has a prior MI or stroke diagnosis     CHA2DS2-VASc Score = 6  This indicates a 9.7% annual risk of stroke. The patient's score is based upon: CHF History: Yes HTN History: Yes Diabetes History: No Stroke History: Yes Vascular Disease History: Yes Age Score: 1 Gender Score: 0     Social History   Tobacco Use  Smoking Status Former   Packs/day: 2.00   Years: 25.00   Pack years: 50.00   Types: Cigarettes  Quit date: 09/26/2009   Years since quitting: 11.8  Smokeless Tobacco Never   BP Readings from Last 3 Encounters:  07/27/21 (!) 100/58  06/10/21 (!) 141/80  05/31/21 (!) 145/88   Pulse Readings from Last 3 Encounters:  07/27/21 (!) 101  06/10/21 74  05/31/21 97   Wt Readings from Last 3 Encounters:  07/27/21 227 lb (103 kg)  06/10/21 217 lb 13 oz (98.8 kg)  05/30/21 217 lb 13 oz (98.8 kg)    Assessment: Review of patient past medical history, allergies, medications, health status, including review of consultants reports, laboratory and other test data, was performed as part of comprehensive evaluation and provision of chronic care management services.   SDOH:  (Social  Determinants of Health) assessments and interventions performed:  SDOH Interventions    Flowsheet Row Most Recent Value  SDOH Interventions   Financial Strain Interventions Intervention Not Indicated       CCM Care Plan  Allergies  Allergen Reactions   Clonidine Derivatives     Reaction unknown   Sulfa Antibiotics     Reaction unknown    Medications Reviewed Today     Reviewed by Earlyne Iba, CMA (Certified Medical Assistant) on 07/27/21 at 1638  Med List Status: <None>   Medication Order Taking? Sig Documenting Provider Last Dose Status Informant  albuterol (PROVENTIL) (2.5 MG/3ML) 0.083% nebulizer solution [id number] Yes Take 3 mLs (2.5 mg total) by nebulization every 6 (six) hours as needed for wheezing or shortness of breath. Loletha Grayer, MD Taking Active Self  Fluticasone-Umeclidin-Vilant (TRELEGY ELLIPTA) 100-62.5-25 MCG/INH AEPB [id number] Yes Inhale 1 puff into the lungs daily. Crecencio Mc, MD Taking Active Self  furosemide (LASIX) 40 MG tablet [id number] Yes Take 1 tablet (40 mg total) by mouth 2 (two) times daily. McLean-Scocuzza, Nino Glow, MD Taking Active Self  ipratropium-albuterol (DUONEB) 0.5-2.5 (3) MG/3ML SOLN [id number] Yes USE 1 VIAL IN NEBULIZER EVERY 6 HOURS AS NEEDED FOR WHEEZING Crecencio Mc, MD Taking Active Self  losartan (COZAAR) 25 MG tablet [id number] Yes Take 1 tablet (25 mg total) by mouth daily. Crecencio Mc, MD Taking Active Self  mupirocin ointment (BACTROBAN) 2 % [id number] Yes Apply 1 application topically 2 (two) times daily. McLean-Scocuzza, Nino Glow, MD Taking Active Self  nitroGLYCERIN (NITROSTAT) 0.4 MG SL tablet [id number] Yes Place 1 tablet (0.4 mg total) under the tongue every 5 (five) minutes as needed for chest pain. Maximum dose 3 tablets Crecencio Mc, MD Taking Active            Med Note ([place] Jun 28, 2021 11:57 AM)    OXYGEN [id number] Yes Inhale 2 L into the lungs 3 (three) times daily as needed  (shortness of breath).  [provider] Taking Active Self  rivaroxaban (XARELTO) 20 MG TABS tablet [id number] Yes Take 1 tablet (20 mg total) by mouth daily with supper. Crecencio Mc, MD Taking Active   simvastatin (ZOCOR) 20 MG tablet [id number] Yes Take 1 tablet (20 mg total) by mouth at bedtime. Crecencio Mc, MD Taking Active Self  spironolactone (ALDACTONE) 50 MG tablet [id number] Yes Take 1 tablet (50 mg total) by mouth daily. In am McLean-Scocuzza, Nino Glow, MD Taking Active Self  traZODone (DESYREL) 50 MG tablet [id number] No Take 1 tablet (50 mg total) by mouth at bedtime.  Patient not taking: Reported on 07/27/2021   Crecencio Mc, MD Not Taking Consider Medication Status and Discontinue Self  Med Note Darnelle Maffucci, Marcea Rojek E   Thu Apr 14, 2021  1:38 PM) Taking PRN  VENTOLIN HFA 108 (90 Base) MCG/ACT inhaler [id number] Yes INHALE 2 PUFFS BY MOUTH EVERY 6 HOURS AS NEEDED FOR WHEEZING OR SHORTNESS OF BREATH Crecencio Mc, MD Taking Active Self  vitamin B-12 (CYANOCOBALAMIN) 1000 MCG tablet [id number] Yes Take 1,000 mcg by mouth daily. [provider] Taking Active             Patient Active Problem List   Diagnosis Date Noted   Accidental drug overdose 06/14/2021   Cellulitis of left lower extremity 05/29/2021   Wound infection 05/29/2021   Chronic diastolic CHF (congestive heart failure) ([place]) 05/29/2021   Hypokalemia 05/29/2021   Lymphedema 05/26/2021   Abnormal gait 05/26/2021   Insomnia due to psychological stress 03/26/2021   Failure to thrive syndrome, adult 03/23/2021   DVT (deep venous thrombosis) (HCC)    Leukocytosis    Hematuria, gross 07/19/2020   Acquired thrombophilia ([place]) 07/04/2020   Atrial fibrillation ([place])    CKD (chronic kidney disease), stage IIIa 03/30/2020   Degenerative joint disease of shoulder region 09/26/2018   Obstructive sleep apnea 11/22/2016   Hypogonadism male 07/07/2016   Obesity 06/03/2016   Reduced libido  05/11/2016   Visit for preventive health examination 03/01/2016   Diabetes mellitus with circulatory complication ([place]) 10/93/2355   Shoulder pain, left 08/03/2015   Pneumonia 73/22/0254   Marital conflict involving estrangement 04/13/2015   Cervical radiculopathy due to degenerative joint disease of spine 03/24/2015   Iron deficiency anemia 02/25/2015   Generalized weakness 02/25/2015   Nocturia 02/25/2015   Pernicious anemia 01/14/2015   S/P hip replacement 12/12/2014   S/p nephrectomy 09/07/2014   Vertigo, central 08/20/2014   Senile purpura ([place]) 06/28/2014   Incomplete emptying of bladder 10/08/2013   Anemia 09/11/2013   Chest pain with high risk for cardiac etiology 04/08/2013   Benign localized prostatic hyperplasia with lower urinary tract symptoms (LUTS) 10/23/2012   Sciatica 10/23/2012   Venous insufficiency (chronic) (peripheral) 07/08/2012   Venous insufficiency    History of alcohol abuse    Vasomotor rhinitis 12/06/2011   CAD (coronary artery disease) 10/12/2011   COPD (chronic obstructive pulmonary disease) ([place])    CHF with left ventricular diastolic dysfunction, NYHA class 2 ([place])    Hyperlipidemia    Hypertension     Immunization History  Administered Date(s) Administered   Influenza Split 10/09/2011, 12/03/2012   Influenza, High Dose Seasonal PF 09/01/2016, 09/12/2017, 12/20/2018   Influenza,inj,Quad PF,6+ Mos 09/10/2013, 09/19/2014   PFIZER(Purple Top)SARS-COV-2 Vaccination 10/01/2020, 11/02/2020   Pneumococcal Conjugate-13 02/23/2016   Pneumococcal Polysaccharide-23 02/21/2010, 12/20/2018   Td 05/26/2021   Tdap 05/25/2010    Conditions to be addressed/monitored: Atrial Fibrillation, CHF, HTN, and HLD  Care Plan : PharmD - Medication Management  Updates made by De Hollingshead, RPH-CPP since 07/29/2021 12:00 AM     Problem: Heart Failure, COPD      Long-Range Goal: Disease Progression Prevention   This Visit's Progress: On track  Recent  Progress: On track  Priority: High  Note:   Current Barriers:  Unable to independently monitor therapeutic efficacy Complex patient with multiple comorbidities including heart failure, COPD  Pharmacist Clinical Goal(s):  Over the next 90 days, patient will maintain control of heart failure as evidenced by no unnecessary hospitalizations Over the next 90 days, patient will adhere to prescribed medication regimen.  Interventions: 1:1 collaboration with Crecencio Mc, MD regarding development and update of  comprehensive plan of care as evidenced by provider attestation and co-signature Inter-disciplinary care team collaboration (see longitudinal plan of care) Comprehensive medication review performed; medication list updated in electronic medical record  Health Maintenance: Continuing to receive [facility] RN services twice weekly.   Medication Management: Patient's neighbor, Vickii Chafe, continues to come over twice daily to arrange medications and feed his cats. She fills a BID pill box and then each morning and evening, pulls out the medications and puts in a small cup. The patient likes being able to see if he has taken his medications or not by if there is anything in the cup.   SDOH: Reports he is tired of others "being in my business". Reports that as directed by Dr. Derrel Nip earlier this week, he called his sister to discus moving in together. He has called several times and she hasn't answered. He notes they do not have a good relationship.   Heart Failure (recovered, EF 55%), Atrial Fibrillation Improving; current treatment: losartan 25 mg QPM, spironolactone 50 mg QAM, furosemide 40 mg QAM BID  Anticoagulant regimen: Xarelto 20 mg QPM; Continue current regimen at this time. Lab work today per PCP.  Hyperlipidemia and Secondary ASCVD Prevention - hx MI, CVA x2 Controlled per last lab work; current treatment: simvastatin 20 mg QPM No antiplatelet therapy d/t concurrent  anticoagulant Recommended to continue current regimen at this time.   Chronic Obstructive Pulmonary Disease: Moderately well controlled; current treatment: Trelegy 100/62.5/25 mcg daily, albuterol HFA PRN or Duonebs TID; oxygen 24 hours; reports minimal need for rescue therapy lately Declines f/u with pulmonary.  Reports requiring refill. Script for Trelegy sent to his mail order pharmacy. Reports he is out, so I will provide a sample when he is in the office for lab work this afternoon;   Chronic Pain: Moderately well managed; oxycodone 5 mg Q6H PRN Declines other interventional therapy for pain or non-opioids.  Continue current regimen at this time  Insomnia: Moderately well controlled; current regimen: trazodone 25 mg QPM PRN, indicated improvement previously.  Previously recommend to continue current regimen at this time  Patient Goals/Self-Care Activities Over the next 90 days, patient will:  - take medications as prescribed weigh daily, and contact provider if weight gain of >3 lbs/day or 5 lbs/week collaborate with provider on medication access solutions  Follow Up Plan: Telephone follow up appointment with care management team member scheduled for: patient declines to schedule follow up at this time, he will reach out with questions or concerns     Medication Assistance: None required.  Patient affirms current coverage meets needs.  Patient's preferred pharmacy is:  [facility] ([place]) [place], [place] [address] Phone: [phone] Fax: [phone]  [facility] - [place], [place] [address] Phone: [phone] Fax: [phone]  [facility] [address]), [place] - [place]) [place] [address] Phone: [phone] Fax:  [phone]   Follow Up:  Patient requests no follow-up at this time.  Plan: Telephone follow up appointment with care management team member scheduled for:  patient declines to schedule at this time  Catie Darnelle Maffucci, PharmD, [place], CPP Clinical Pharmacist [place] at [facility] [phone]  Medication Samples have been provided to the patient.  Drug name: Trelegy       Strength: 100/62.5/25 mcg  Qty: 1  LOT: [id number]  Exp.Date: 08/2022

## 2021-07-29 NOTE — Assessment & Plan Note (Signed)
He has neck pain, shoulder pain and  bilateral hand numbness aggravated by arthritis.Victor Daniels    He has required use of oxycodone 15 mg every 8 hours prn due to CKD, His recent ER visit for presumed accidental overdose was discussed today . He has been in considerable pain since refills were denied but now has assistance with is medications by a neighbor  who administers them in small cups.   Refill for one month  As a trial

## 2021-07-29 NOTE — Telephone Encounter (Signed)
Please place future orders for lab appt.   Appt is today at 2:15, please order labs to [facility].

## 2021-07-30 LAB — COMPREHENSIVE METABOLIC PANEL
AG Ratio: 0.6 (calc) — ABNORMAL LOW (ref 1.0–2.5)
ALT: 15 U/L (ref 9–46)
AST: 21 U/L (ref 10–35)
Albumin: 2.8 g/dL — ABNORMAL LOW (ref 3.6–5.1)
Alkaline phosphatase (APISO): 127 U/L (ref 35–144)
BUN/Creatinine Ratio: 27 (calc) — ABNORMAL HIGH (ref 6–22)
BUN: 47 mg/dL — ABNORMAL HIGH (ref 7–25)
CO2: 27 mmol/L (ref 20–32)
Calcium: 8.6 mg/dL (ref 8.6–10.3)
Chloride: 97 mmol/L — ABNORMAL LOW (ref 98–110)
Creat: 1.71 mg/dL — ABNORMAL HIGH (ref 0.70–1.28)
Globulin: 4.8 g/dL (calc) — ABNORMAL HIGH (ref 1.9–3.7)
Glucose, Bld: 94 mg/dL (ref 65–99)
Potassium: 5 mmol/L (ref 3.5–5.3)
Sodium: 135 mmol/L (ref 135–146)
Total Bilirubin: 0.5 mg/dL (ref 0.2–1.2)
Total Protein: 7.6 g/dL (ref 6.1–8.1)

## 2021-07-30 LAB — TSH: TSH: 2.16 mIU/L (ref 0.40–4.50)

## 2021-07-31 NOTE — Addendum Note (Signed)
Addended by: [name] on: 07/31/2021 03:38 PM   Modules accepted: Orders

## 2021-07-31 NOTE — Addendum Note (Signed)
Addended by: [name] on: 07/31/2021 03:58 PM   Modules accepted: Orders

## 2021-07-31 NOTE — Assessment & Plan Note (Signed)
I have reinstated a 30 day  Refill of oxycodone for patient based on his need for pain management and his supervised administration.

## 2021-08-02 DIAGNOSIS — E1122 Type 2 diabetes mellitus with diabetic chronic kidney disease: Secondary | ICD-10-CM | POA: Diagnosis not present

## 2021-08-02 DIAGNOSIS — I5032 Chronic diastolic (congestive) heart failure: Secondary | ICD-10-CM | POA: Diagnosis not present

## 2021-08-02 DIAGNOSIS — I130 Hypertensive heart and chronic kidney disease with heart failure and stage 1 through stage 4 chronic kidney disease, or unspecified chronic kidney disease: Secondary | ICD-10-CM | POA: Diagnosis not present

## 2021-08-02 DIAGNOSIS — L89620 Pressure ulcer of left heel, unstageable: Secondary | ICD-10-CM | POA: Diagnosis not present

## 2021-08-02 DIAGNOSIS — I890 Lymphedema, not elsewhere classified: Secondary | ICD-10-CM | POA: Diagnosis not present

## 2021-08-02 DIAGNOSIS — I872 Venous insufficiency (chronic) (peripheral): Secondary | ICD-10-CM | POA: Diagnosis not present

## 2021-08-02 DIAGNOSIS — D631 Anemia in chronic kidney disease: Secondary | ICD-10-CM | POA: Diagnosis not present

## 2021-08-02 DIAGNOSIS — E1151 Type 2 diabetes mellitus with diabetic peripheral angiopathy without gangrene: Secondary | ICD-10-CM | POA: Diagnosis not present

## 2021-08-02 DIAGNOSIS — N1831 Chronic kidney disease, stage 3a: Secondary | ICD-10-CM | POA: Diagnosis not present

## 2021-08-03 ENCOUNTER — Telehealth: Payer: Self-pay

## 2021-08-03 ENCOUNTER — Telehealth: Payer: Self-pay | Admitting: Internal Medicine

## 2021-08-03 DIAGNOSIS — I5032 Chronic diastolic (congestive) heart failure: Secondary | ICD-10-CM | POA: Diagnosis not present

## 2021-08-03 DIAGNOSIS — N1831 Chronic kidney disease, stage 3a: Secondary | ICD-10-CM | POA: Diagnosis not present

## 2021-08-03 DIAGNOSIS — E1151 Type 2 diabetes mellitus with diabetic peripheral angiopathy without gangrene: Secondary | ICD-10-CM | POA: Diagnosis not present

## 2021-08-03 DIAGNOSIS — I130 Hypertensive heart and chronic kidney disease with heart failure and stage 1 through stage 4 chronic kidney disease, or unspecified chronic kidney disease: Secondary | ICD-10-CM | POA: Diagnosis not present

## 2021-08-03 DIAGNOSIS — I890 Lymphedema, not elsewhere classified: Secondary | ICD-10-CM | POA: Diagnosis not present

## 2021-08-03 DIAGNOSIS — I872 Venous insufficiency (chronic) (peripheral): Secondary | ICD-10-CM | POA: Diagnosis not present

## 2021-08-03 DIAGNOSIS — E1122 Type 2 diabetes mellitus with diabetic chronic kidney disease: Secondary | ICD-10-CM | POA: Diagnosis not present

## 2021-08-03 DIAGNOSIS — L89620 Pressure ulcer of left heel, unstageable: Secondary | ICD-10-CM | POA: Diagnosis not present

## 2021-08-03 DIAGNOSIS — D631 Anemia in chronic kidney disease: Secondary | ICD-10-CM | POA: Diagnosis not present

## 2021-08-03 NOTE — Telephone Encounter (Signed)
Patient stated Dr.Tullo told him to stop taking 2 of his medicines a couple of weeks ago but he is unsure which medicines they are.Please call him at [phone].

## 2021-08-03 NOTE — Telephone Encounter (Signed)
LMTCB in regards to lab results.  

## 2021-08-05 DIAGNOSIS — I872 Venous insufficiency (chronic) (peripheral): Secondary | ICD-10-CM | POA: Diagnosis not present

## 2021-08-05 DIAGNOSIS — N1831 Chronic kidney disease, stage 3a: Secondary | ICD-10-CM | POA: Diagnosis not present

## 2021-08-05 DIAGNOSIS — D631 Anemia in chronic kidney disease: Secondary | ICD-10-CM | POA: Diagnosis not present

## 2021-08-05 DIAGNOSIS — I5032 Chronic diastolic (congestive) heart failure: Secondary | ICD-10-CM | POA: Diagnosis not present

## 2021-08-05 DIAGNOSIS — I130 Hypertensive heart and chronic kidney disease with heart failure and stage 1 through stage 4 chronic kidney disease, or unspecified chronic kidney disease: Secondary | ICD-10-CM | POA: Diagnosis not present

## 2021-08-05 DIAGNOSIS — E1151 Type 2 diabetes mellitus with diabetic peripheral angiopathy without gangrene: Secondary | ICD-10-CM | POA: Diagnosis not present

## 2021-08-05 DIAGNOSIS — I890 Lymphedema, not elsewhere classified: Secondary | ICD-10-CM | POA: Diagnosis not present

## 2021-08-05 DIAGNOSIS — E1122 Type 2 diabetes mellitus with diabetic chronic kidney disease: Secondary | ICD-10-CM | POA: Diagnosis not present

## 2021-08-05 DIAGNOSIS — L89620 Pressure ulcer of left heel, unstageable: Secondary | ICD-10-CM | POA: Diagnosis not present

## 2021-08-06 ENCOUNTER — Other Ambulatory Visit: Payer: Self-pay | Admitting: Internal Medicine

## 2021-08-08 DIAGNOSIS — I4891 Unspecified atrial fibrillation: Secondary | ICD-10-CM

## 2021-08-08 DIAGNOSIS — E1151 Type 2 diabetes mellitus with diabetic peripheral angiopathy without gangrene: Secondary | ICD-10-CM | POA: Diagnosis not present

## 2021-08-08 DIAGNOSIS — Z7901 Long term (current) use of anticoagulants: Secondary | ICD-10-CM

## 2021-08-08 DIAGNOSIS — E669 Obesity, unspecified: Secondary | ICD-10-CM

## 2021-08-08 DIAGNOSIS — D510 Vitamin B12 deficiency anemia due to intrinsic factor deficiency: Secondary | ICD-10-CM

## 2021-08-08 DIAGNOSIS — L89620 Pressure ulcer of left heel, unstageable: Secondary | ICD-10-CM | POA: Diagnosis not present

## 2021-08-08 DIAGNOSIS — Z9181 History of falling: Secondary | ICD-10-CM

## 2021-08-08 DIAGNOSIS — I2510 Atherosclerotic heart disease of native coronary artery without angina pectoris: Secondary | ICD-10-CM

## 2021-08-08 DIAGNOSIS — D631 Anemia in chronic kidney disease: Secondary | ICD-10-CM | POA: Diagnosis not present

## 2021-08-08 DIAGNOSIS — I69318 Other symptoms and signs involving cognitive functions following cerebral infarction: Secondary | ICD-10-CM

## 2021-08-08 DIAGNOSIS — Z602 Problems related to living alone: Secondary | ICD-10-CM

## 2021-08-08 DIAGNOSIS — H547 Unspecified visual loss: Secondary | ICD-10-CM

## 2021-08-08 DIAGNOSIS — Z905 Acquired absence of kidney: Secondary | ICD-10-CM

## 2021-08-08 DIAGNOSIS — I130 Hypertensive heart and chronic kidney disease with heart failure and stage 1 through stage 4 chronic kidney disease, or unspecified chronic kidney disease: Secondary | ICD-10-CM | POA: Diagnosis not present

## 2021-08-08 DIAGNOSIS — Z87891 Personal history of nicotine dependence: Secondary | ICD-10-CM

## 2021-08-08 DIAGNOSIS — F5102 Adjustment insomnia: Secondary | ICD-10-CM

## 2021-08-08 DIAGNOSIS — R338 Other retention of urine: Secondary | ICD-10-CM

## 2021-08-08 DIAGNOSIS — N401 Enlarged prostate with lower urinary tract symptoms: Secondary | ICD-10-CM

## 2021-08-08 DIAGNOSIS — R351 Nocturia: Secondary | ICD-10-CM

## 2021-08-08 DIAGNOSIS — N1831 Chronic kidney disease, stage 3a: Secondary | ICD-10-CM | POA: Diagnosis not present

## 2021-08-08 DIAGNOSIS — H9193 Unspecified hearing loss, bilateral: Secondary | ICD-10-CM

## 2021-08-08 DIAGNOSIS — Z7951 Long term (current) use of inhaled steroids: Secondary | ICD-10-CM

## 2021-08-08 DIAGNOSIS — J449 Chronic obstructive pulmonary disease, unspecified: Secondary | ICD-10-CM

## 2021-08-08 DIAGNOSIS — E785 Hyperlipidemia, unspecified: Secondary | ICD-10-CM

## 2021-08-08 DIAGNOSIS — I252 Old myocardial infarction: Secondary | ICD-10-CM

## 2021-08-08 DIAGNOSIS — N39498 Other specified urinary incontinence: Secondary | ICD-10-CM

## 2021-08-08 DIAGNOSIS — R627 Adult failure to thrive: Secondary | ICD-10-CM

## 2021-08-08 DIAGNOSIS — Z634 Disappearance and death of family member: Secondary | ICD-10-CM

## 2021-08-08 DIAGNOSIS — D509 Iron deficiency anemia, unspecified: Secondary | ICD-10-CM

## 2021-08-08 DIAGNOSIS — G4733 Obstructive sleep apnea (adult) (pediatric): Secondary | ICD-10-CM

## 2021-08-08 DIAGNOSIS — M19012 Primary osteoarthritis, left shoulder: Secondary | ICD-10-CM

## 2021-08-08 DIAGNOSIS — I872 Venous insufficiency (chronic) (peripheral): Secondary | ICD-10-CM | POA: Diagnosis not present

## 2021-08-08 DIAGNOSIS — I5032 Chronic diastolic (congestive) heart failure: Secondary | ICD-10-CM | POA: Diagnosis not present

## 2021-08-08 DIAGNOSIS — M4722 Other spondylosis with radiculopathy, cervical region: Secondary | ICD-10-CM

## 2021-08-08 DIAGNOSIS — I890 Lymphedema, not elsewhere classified: Secondary | ICD-10-CM | POA: Diagnosis not present

## 2021-08-08 DIAGNOSIS — Z86718 Personal history of other venous thrombosis and embolism: Secondary | ICD-10-CM

## 2021-08-08 DIAGNOSIS — E1122 Type 2 diabetes mellitus with diabetic chronic kidney disease: Secondary | ICD-10-CM | POA: Diagnosis not present

## 2021-08-08 NOTE — Telephone Encounter (Signed)
LMTCB

## 2021-08-09 ENCOUNTER — Telehealth: Payer: Self-pay | Admitting: Internal Medicine

## 2021-08-09 DIAGNOSIS — L89620 Pressure ulcer of left heel, unstageable: Secondary | ICD-10-CM | POA: Diagnosis not present

## 2021-08-09 DIAGNOSIS — N1831 Chronic kidney disease, stage 3a: Secondary | ICD-10-CM | POA: Diagnosis not present

## 2021-08-09 DIAGNOSIS — D631 Anemia in chronic kidney disease: Secondary | ICD-10-CM | POA: Diagnosis not present

## 2021-08-09 DIAGNOSIS — I872 Venous insufficiency (chronic) (peripheral): Secondary | ICD-10-CM | POA: Diagnosis not present

## 2021-08-09 DIAGNOSIS — I890 Lymphedema, not elsewhere classified: Secondary | ICD-10-CM | POA: Diagnosis not present

## 2021-08-09 DIAGNOSIS — I130 Hypertensive heart and chronic kidney disease with heart failure and stage 1 through stage 4 chronic kidney disease, or unspecified chronic kidney disease: Secondary | ICD-10-CM | POA: Diagnosis not present

## 2021-08-09 DIAGNOSIS — I5032 Chronic diastolic (congestive) heart failure: Secondary | ICD-10-CM | POA: Diagnosis not present

## 2021-08-09 DIAGNOSIS — E1151 Type 2 diabetes mellitus with diabetic peripheral angiopathy without gangrene: Secondary | ICD-10-CM | POA: Diagnosis not present

## 2021-08-09 DIAGNOSIS — E1122 Type 2 diabetes mellitus with diabetic chronic kidney disease: Secondary | ICD-10-CM | POA: Diagnosis not present

## 2021-08-09 NOTE — Telephone Encounter (Signed)
Patient called asking the provider to speak with his lawyer . I informed him that the provider was activity seeing patients. I informed the patient that if he is needing any documentation from the provider he would need to submit paperwork from his Lawyer.

## 2021-08-11 NOTE — Telephone Encounter (Signed)
LMTCB

## 2021-08-12 DIAGNOSIS — L89620 Pressure ulcer of left heel, unstageable: Secondary | ICD-10-CM | POA: Diagnosis not present

## 2021-08-12 DIAGNOSIS — I130 Hypertensive heart and chronic kidney disease with heart failure and stage 1 through stage 4 chronic kidney disease, or unspecified chronic kidney disease: Secondary | ICD-10-CM | POA: Diagnosis not present

## 2021-08-12 DIAGNOSIS — E1122 Type 2 diabetes mellitus with diabetic chronic kidney disease: Secondary | ICD-10-CM | POA: Diagnosis not present

## 2021-08-12 DIAGNOSIS — N1831 Chronic kidney disease, stage 3a: Secondary | ICD-10-CM | POA: Diagnosis not present

## 2021-08-12 DIAGNOSIS — I872 Venous insufficiency (chronic) (peripheral): Secondary | ICD-10-CM | POA: Diagnosis not present

## 2021-08-12 DIAGNOSIS — I5032 Chronic diastolic (congestive) heart failure: Secondary | ICD-10-CM | POA: Diagnosis not present

## 2021-08-12 DIAGNOSIS — E1151 Type 2 diabetes mellitus with diabetic peripheral angiopathy without gangrene: Secondary | ICD-10-CM | POA: Diagnosis not present

## 2021-08-12 DIAGNOSIS — I890 Lymphedema, not elsewhere classified: Secondary | ICD-10-CM | POA: Diagnosis not present

## 2021-08-12 DIAGNOSIS — D631 Anemia in chronic kidney disease: Secondary | ICD-10-CM | POA: Diagnosis not present

## 2021-08-15 ENCOUNTER — Other Ambulatory Visit: Payer: Self-pay | Admitting: Internal Medicine

## 2021-08-15 DIAGNOSIS — E782 Mixed hyperlipidemia: Secondary | ICD-10-CM

## 2021-08-16 DIAGNOSIS — I5032 Chronic diastolic (congestive) heart failure: Secondary | ICD-10-CM | POA: Diagnosis not present

## 2021-08-16 DIAGNOSIS — E1122 Type 2 diabetes mellitus with diabetic chronic kidney disease: Secondary | ICD-10-CM | POA: Diagnosis not present

## 2021-08-16 DIAGNOSIS — I872 Venous insufficiency (chronic) (peripheral): Secondary | ICD-10-CM | POA: Diagnosis not present

## 2021-08-16 DIAGNOSIS — I130 Hypertensive heart and chronic kidney disease with heart failure and stage 1 through stage 4 chronic kidney disease, or unspecified chronic kidney disease: Secondary | ICD-10-CM | POA: Diagnosis not present

## 2021-08-16 DIAGNOSIS — D631 Anemia in chronic kidney disease: Secondary | ICD-10-CM | POA: Diagnosis not present

## 2021-08-16 DIAGNOSIS — L89620 Pressure ulcer of left heel, unstageable: Secondary | ICD-10-CM | POA: Diagnosis not present

## 2021-08-16 DIAGNOSIS — E1151 Type 2 diabetes mellitus with diabetic peripheral angiopathy without gangrene: Secondary | ICD-10-CM | POA: Diagnosis not present

## 2021-08-16 DIAGNOSIS — N1831 Chronic kidney disease, stage 3a: Secondary | ICD-10-CM | POA: Diagnosis not present

## 2021-08-16 DIAGNOSIS — I890 Lymphedema, not elsewhere classified: Secondary | ICD-10-CM | POA: Diagnosis not present

## 2021-08-17 ENCOUNTER — Other Ambulatory Visit: Payer: Self-pay

## 2021-08-17 ENCOUNTER — Other Ambulatory Visit (INDEPENDENT_AMBULATORY_CARE_PROVIDER_SITE_OTHER): Payer: Medicare HMO

## 2021-08-17 DIAGNOSIS — N179 Acute kidney failure, unspecified: Secondary | ICD-10-CM | POA: Diagnosis not present

## 2021-08-18 LAB — BASIC METABOLIC PANEL
BUN: 22 mg/dL (ref 6–23)
CO2: 29 mEq/L (ref 19–32)
Calcium: 9.3 mg/dL (ref 8.4–10.5)
Chloride: 98 mEq/L (ref 96–112)
Creatinine, Ser: 1.42 mg/dL (ref 0.40–1.50)
GFR: 48.56 mL/min — ABNORMAL LOW (ref >60.00)
Glucose, Bld: 158 mg/dL — ABNORMAL HIGH (ref 70–99)
Potassium: 4.8 mEq/L (ref 3.5–5.1)
Sodium: 133 mEq/L — ABNORMAL LOW (ref 135–145)

## 2021-08-19 DIAGNOSIS — E1151 Type 2 diabetes mellitus with diabetic peripheral angiopathy without gangrene: Secondary | ICD-10-CM | POA: Diagnosis not present

## 2021-08-19 DIAGNOSIS — I890 Lymphedema, not elsewhere classified: Secondary | ICD-10-CM | POA: Diagnosis not present

## 2021-08-19 DIAGNOSIS — I5032 Chronic diastolic (congestive) heart failure: Secondary | ICD-10-CM | POA: Diagnosis not present

## 2021-08-19 DIAGNOSIS — L89620 Pressure ulcer of left heel, unstageable: Secondary | ICD-10-CM | POA: Diagnosis not present

## 2021-08-19 DIAGNOSIS — D631 Anemia in chronic kidney disease: Secondary | ICD-10-CM | POA: Diagnosis not present

## 2021-08-19 DIAGNOSIS — E1122 Type 2 diabetes mellitus with diabetic chronic kidney disease: Secondary | ICD-10-CM | POA: Diagnosis not present

## 2021-08-19 DIAGNOSIS — I130 Hypertensive heart and chronic kidney disease with heart failure and stage 1 through stage 4 chronic kidney disease, or unspecified chronic kidney disease: Secondary | ICD-10-CM | POA: Diagnosis not present

## 2021-08-19 DIAGNOSIS — N1831 Chronic kidney disease, stage 3a: Secondary | ICD-10-CM | POA: Diagnosis not present

## 2021-08-19 DIAGNOSIS — I872 Venous insufficiency (chronic) (peripheral): Secondary | ICD-10-CM | POA: Diagnosis not present

## 2021-08-19 NOTE — Addendum Note (Signed)
Addended by: [name] on: 08/19/2021 02:05 PM   Modules accepted: Orders

## 2021-08-23 DIAGNOSIS — I890 Lymphedema, not elsewhere classified: Secondary | ICD-10-CM | POA: Diagnosis not present

## 2021-08-23 DIAGNOSIS — I872 Venous insufficiency (chronic) (peripheral): Secondary | ICD-10-CM | POA: Diagnosis not present

## 2021-08-23 DIAGNOSIS — N1831 Chronic kidney disease, stage 3a: Secondary | ICD-10-CM | POA: Diagnosis not present

## 2021-08-23 DIAGNOSIS — L89620 Pressure ulcer of left heel, unstageable: Secondary | ICD-10-CM | POA: Diagnosis not present

## 2021-08-23 DIAGNOSIS — I130 Hypertensive heart and chronic kidney disease with heart failure and stage 1 through stage 4 chronic kidney disease, or unspecified chronic kidney disease: Secondary | ICD-10-CM | POA: Diagnosis not present

## 2021-08-23 DIAGNOSIS — I5032 Chronic diastolic (congestive) heart failure: Secondary | ICD-10-CM | POA: Diagnosis not present

## 2021-08-23 DIAGNOSIS — E1122 Type 2 diabetes mellitus with diabetic chronic kidney disease: Secondary | ICD-10-CM | POA: Diagnosis not present

## 2021-08-23 DIAGNOSIS — D631 Anemia in chronic kidney disease: Secondary | ICD-10-CM | POA: Diagnosis not present

## 2021-08-23 DIAGNOSIS — E1151 Type 2 diabetes mellitus with diabetic peripheral angiopathy without gangrene: Secondary | ICD-10-CM | POA: Diagnosis not present

## 2021-08-24 ENCOUNTER — Telehealth: Payer: Self-pay

## 2021-08-24 NOTE — Telephone Encounter (Signed)
LMTCB. Need to find if if pt stopped taking the spironolactone and the losartan. If so need to find out if he can come in the office in two weeks for blood work. If he can not we can check and see if his HH can come out and draw it at is home.

## 2021-08-25 NOTE — Telephone Encounter (Signed)
Pt called back and he still was unsure as to whether he was taking the losartan and the spironolactone. Pt wrote down both names and stated that he would take them out of his pill box. Pt was advised that once he stops both medications he needs to schedule an appt to have repeat blood work two weeks later.

## 2021-08-25 NOTE — Telephone Encounter (Signed)
Pt returned your call.  

## 2021-08-27 DIAGNOSIS — L89620 Pressure ulcer of left heel, unstageable: Secondary | ICD-10-CM | POA: Diagnosis not present

## 2021-08-27 DIAGNOSIS — D631 Anemia in chronic kidney disease: Secondary | ICD-10-CM | POA: Diagnosis not present

## 2021-08-27 DIAGNOSIS — I130 Hypertensive heart and chronic kidney disease with heart failure and stage 1 through stage 4 chronic kidney disease, or unspecified chronic kidney disease: Secondary | ICD-10-CM | POA: Diagnosis not present

## 2021-08-27 DIAGNOSIS — E1122 Type 2 diabetes mellitus with diabetic chronic kidney disease: Secondary | ICD-10-CM | POA: Diagnosis not present

## 2021-08-27 DIAGNOSIS — I890 Lymphedema, not elsewhere classified: Secondary | ICD-10-CM | POA: Diagnosis not present

## 2021-08-27 DIAGNOSIS — I872 Venous insufficiency (chronic) (peripheral): Secondary | ICD-10-CM | POA: Diagnosis not present

## 2021-08-27 DIAGNOSIS — N1831 Chronic kidney disease, stage 3a: Secondary | ICD-10-CM | POA: Diagnosis not present

## 2021-08-27 DIAGNOSIS — I5032 Chronic diastolic (congestive) heart failure: Secondary | ICD-10-CM | POA: Diagnosis not present

## 2021-08-27 DIAGNOSIS — E1151 Type 2 diabetes mellitus with diabetic peripheral angiopathy without gangrene: Secondary | ICD-10-CM | POA: Diagnosis not present

## 2021-08-30 ENCOUNTER — Other Ambulatory Visit: Payer: Self-pay | Admitting: Internal Medicine

## 2021-08-30 DIAGNOSIS — N1831 Chronic kidney disease, stage 3a: Secondary | ICD-10-CM | POA: Diagnosis not present

## 2021-08-30 DIAGNOSIS — I5032 Chronic diastolic (congestive) heart failure: Secondary | ICD-10-CM | POA: Diagnosis not present

## 2021-08-30 DIAGNOSIS — L89620 Pressure ulcer of left heel, unstageable: Secondary | ICD-10-CM | POA: Diagnosis not present

## 2021-08-30 DIAGNOSIS — I872 Venous insufficiency (chronic) (peripheral): Secondary | ICD-10-CM | POA: Diagnosis not present

## 2021-08-30 DIAGNOSIS — I890 Lymphedema, not elsewhere classified: Secondary | ICD-10-CM | POA: Diagnosis not present

## 2021-08-30 DIAGNOSIS — R600 Localized edema: Secondary | ICD-10-CM

## 2021-08-30 DIAGNOSIS — E1151 Type 2 diabetes mellitus with diabetic peripheral angiopathy without gangrene: Secondary | ICD-10-CM | POA: Diagnosis not present

## 2021-08-30 DIAGNOSIS — E1122 Type 2 diabetes mellitus with diabetic chronic kidney disease: Secondary | ICD-10-CM | POA: Diagnosis not present

## 2021-08-30 DIAGNOSIS — I130 Hypertensive heart and chronic kidney disease with heart failure and stage 1 through stage 4 chronic kidney disease, or unspecified chronic kidney disease: Secondary | ICD-10-CM | POA: Diagnosis not present

## 2021-08-30 DIAGNOSIS — D631 Anemia in chronic kidney disease: Secondary | ICD-10-CM | POA: Diagnosis not present

## 2021-08-30 MED ORDER — OXYCODONE HCL 15 MG PO TABS: 15.0000 mg | ORAL_TABLET | Freq: Four times a day (QID) | ORAL | 0 refills | Status: DC | PRN

## 2021-08-30 NOTE — Telephone Encounter (Signed)
Patient last seen 07/27/21 medication last sent in 07/27/21 30 day supply  Pended for your approval or denial.

## 2021-08-30 NOTE — Telephone Encounter (Signed)
Refilled: 07/27/2021 Last OV: 07/27/2021 Next OV: not scheduled

## 2021-08-30 NOTE — Progress Notes (Signed)
Oxycodone refilled and sent to [facility] pharmacy

## 2021-09-01 DIAGNOSIS — I5032 Chronic diastolic (congestive) heart failure: Secondary | ICD-10-CM | POA: Diagnosis not present

## 2021-09-01 DIAGNOSIS — D631 Anemia in chronic kidney disease: Secondary | ICD-10-CM | POA: Diagnosis not present

## 2021-09-01 DIAGNOSIS — E1122 Type 2 diabetes mellitus with diabetic chronic kidney disease: Secondary | ICD-10-CM | POA: Diagnosis not present

## 2021-09-01 DIAGNOSIS — N1831 Chronic kidney disease, stage 3a: Secondary | ICD-10-CM | POA: Diagnosis not present

## 2021-09-01 DIAGNOSIS — I872 Venous insufficiency (chronic) (peripheral): Secondary | ICD-10-CM | POA: Diagnosis not present

## 2021-09-01 DIAGNOSIS — I130 Hypertensive heart and chronic kidney disease with heart failure and stage 1 through stage 4 chronic kidney disease, or unspecified chronic kidney disease: Secondary | ICD-10-CM | POA: Diagnosis not present

## 2021-09-01 DIAGNOSIS — L89620 Pressure ulcer of left heel, unstageable: Secondary | ICD-10-CM | POA: Diagnosis not present

## 2021-09-01 DIAGNOSIS — E1151 Type 2 diabetes mellitus with diabetic peripheral angiopathy without gangrene: Secondary | ICD-10-CM | POA: Diagnosis not present

## 2021-09-01 DIAGNOSIS — I890 Lymphedema, not elsewhere classified: Secondary | ICD-10-CM | POA: Diagnosis not present

## 2021-09-06 ENCOUNTER — Other Ambulatory Visit: Payer: Self-pay | Admitting: Internal Medicine

## 2021-09-06 DIAGNOSIS — N1831 Chronic kidney disease, stage 3a: Secondary | ICD-10-CM | POA: Diagnosis not present

## 2021-09-06 DIAGNOSIS — I890 Lymphedema, not elsewhere classified: Secondary | ICD-10-CM | POA: Diagnosis not present

## 2021-09-06 DIAGNOSIS — I130 Hypertensive heart and chronic kidney disease with heart failure and stage 1 through stage 4 chronic kidney disease, or unspecified chronic kidney disease: Secondary | ICD-10-CM | POA: Diagnosis not present

## 2021-09-06 DIAGNOSIS — L89620 Pressure ulcer of left heel, unstageable: Secondary | ICD-10-CM | POA: Diagnosis not present

## 2021-09-06 DIAGNOSIS — I872 Venous insufficiency (chronic) (peripheral): Secondary | ICD-10-CM | POA: Diagnosis not present

## 2021-09-06 DIAGNOSIS — E1122 Type 2 diabetes mellitus with diabetic chronic kidney disease: Secondary | ICD-10-CM | POA: Diagnosis not present

## 2021-09-06 DIAGNOSIS — I5032 Chronic diastolic (congestive) heart failure: Secondary | ICD-10-CM | POA: Diagnosis not present

## 2021-09-06 DIAGNOSIS — R600 Localized edema: Secondary | ICD-10-CM

## 2021-09-06 DIAGNOSIS — D631 Anemia in chronic kidney disease: Secondary | ICD-10-CM | POA: Diagnosis not present

## 2021-09-06 DIAGNOSIS — E1151 Type 2 diabetes mellitus with diabetic peripheral angiopathy without gangrene: Secondary | ICD-10-CM | POA: Diagnosis not present

## 2021-09-08 ENCOUNTER — Other Ambulatory Visit: Payer: Self-pay

## 2021-09-08 DIAGNOSIS — I5030 Unspecified diastolic (congestive) heart failure: Secondary | ICD-10-CM

## 2021-09-08 MED ORDER — LOSARTAN POTASSIUM 25 MG PO TABS: 25.0000 mg | ORAL_TABLET | Freq: Every day | ORAL | 1 refills | Status: AC

## 2021-09-13 DIAGNOSIS — I872 Venous insufficiency (chronic) (peripheral): Secondary | ICD-10-CM | POA: Diagnosis not present

## 2021-09-13 DIAGNOSIS — N1831 Chronic kidney disease, stage 3a: Secondary | ICD-10-CM | POA: Diagnosis not present

## 2021-09-13 DIAGNOSIS — I890 Lymphedema, not elsewhere classified: Secondary | ICD-10-CM | POA: Diagnosis not present

## 2021-09-13 DIAGNOSIS — L89620 Pressure ulcer of left heel, unstageable: Secondary | ICD-10-CM | POA: Diagnosis not present

## 2021-09-13 DIAGNOSIS — D631 Anemia in chronic kidney disease: Secondary | ICD-10-CM | POA: Diagnosis not present

## 2021-09-13 DIAGNOSIS — I130 Hypertensive heart and chronic kidney disease with heart failure and stage 1 through stage 4 chronic kidney disease, or unspecified chronic kidney disease: Secondary | ICD-10-CM | POA: Diagnosis not present

## 2021-09-13 DIAGNOSIS — I5032 Chronic diastolic (congestive) heart failure: Secondary | ICD-10-CM | POA: Diagnosis not present

## 2021-09-13 DIAGNOSIS — E1122 Type 2 diabetes mellitus with diabetic chronic kidney disease: Secondary | ICD-10-CM | POA: Diagnosis not present

## 2021-09-13 DIAGNOSIS — E1151 Type 2 diabetes mellitus with diabetic peripheral angiopathy without gangrene: Secondary | ICD-10-CM | POA: Diagnosis not present

## 2021-09-15 NOTE — Telephone Encounter (Signed)
Patient calling back in. It has been 2 weeks since he stopped the medication. Scheduled for labs 09/16/21

## 2021-09-16 ENCOUNTER — Other Ambulatory Visit: Payer: Medicare HMO

## 2021-09-21 ENCOUNTER — Other Ambulatory Visit: Payer: Medicare HMO

## 2021-09-22 ENCOUNTER — Other Ambulatory Visit: Payer: Self-pay

## 2021-09-22 ENCOUNTER — Other Ambulatory Visit (INDEPENDENT_AMBULATORY_CARE_PROVIDER_SITE_OTHER): Payer: Medicare HMO

## 2021-09-22 DIAGNOSIS — N179 Acute kidney failure, unspecified: Secondary | ICD-10-CM | POA: Diagnosis not present

## 2021-09-22 DIAGNOSIS — I5030 Unspecified diastolic (congestive) heart failure: Secondary | ICD-10-CM

## 2021-09-22 DIAGNOSIS — R600 Localized edema: Secondary | ICD-10-CM

## 2021-09-22 LAB — BASIC METABOLIC PANEL
BUN: 26 mg/dL — ABNORMAL HIGH (ref 6–23)
CO2: 33 mEq/L — ABNORMAL HIGH (ref 19–32)
Calcium: 9.2 mg/dL (ref 8.4–10.5)
Chloride: 96 mEq/L (ref 96–112)
Creatinine, Ser: 1.75 mg/dL — ABNORMAL HIGH (ref 0.40–1.50)
GFR: 37.77 mL/min — ABNORMAL LOW (ref >60.00)
Glucose, Bld: 102 mg/dL — ABNORMAL HIGH (ref 70–99)
Potassium: 3.6 mEq/L (ref 3.5–5.1)
Sodium: 137 mEq/L (ref 135–145)

## 2021-09-22 MED ORDER — FUROSEMIDE 40 MG PO TABS: 40.0000 mg | ORAL_TABLET | Freq: Every day | ORAL | 0 refills | Status: DC

## 2021-09-22 NOTE — Assessment & Plan Note (Signed)
Furosemide dose reduced to 40 mg daily for  Change in GFR

## 2021-09-22 NOTE — Addendum Note (Signed)
Addended by: [name] on: 09/22/2021 09:41 PM   Modules accepted: Orders

## 2021-09-22 NOTE — Progress Notes (Signed)
Furosemide dose reduced to 40 mg daily for drop in GFR

## 2021-09-23 ENCOUNTER — Telehealth: Payer: Self-pay

## 2021-09-23 MED ORDER — OXYCODONE HCL 15 MG PO TABS: 15.0000 mg | ORAL_TABLET | Freq: Four times a day (QID) | ORAL | 0 refills | Status: DC | PRN

## 2021-09-23 NOTE — Telephone Encounter (Signed)
Oxycodone refilled and sent to [facility] pharmacy on 08-30-21. Patient stated they have not received the medication. He would like his refill resent in to [facility] pharmacy on [address]

## 2021-09-23 NOTE — Telephone Encounter (Signed)
THE OXYCODONE CANNOT BE PICKED UP UNTIL October 5 .

## 2021-09-23 NOTE — Addendum Note (Signed)
Addended by: [name] on: 09/23/2021 03:59 PM   Modules accepted: Orders

## 2021-09-27 ENCOUNTER — Telehealth: Payer: Self-pay | Admitting: Internal Medicine

## 2021-09-27 ENCOUNTER — Ambulatory Visit: Payer: Medicare HMO | Admitting: Pharmacist

## 2021-09-27 DIAGNOSIS — E782 Mixed hyperlipidemia: Secondary | ICD-10-CM

## 2021-09-27 DIAGNOSIS — I25118 Atherosclerotic heart disease of native coronary artery with other forms of angina pectoris: Secondary | ICD-10-CM

## 2021-09-27 DIAGNOSIS — I5030 Unspecified diastolic (congestive) heart failure: Secondary | ICD-10-CM

## 2021-09-27 DIAGNOSIS — I10 Essential (primary) hypertension: Secondary | ICD-10-CM

## 2021-09-27 DIAGNOSIS — I4819 Other persistent atrial fibrillation: Secondary | ICD-10-CM

## 2021-09-27 NOTE — Telephone Encounter (Signed)
Patient called in stating that his rivaroxaban (XARELTO) 20 MG TABS tablet will now be $422 a month and he would like to know if a cheaper medicine can be sent in for him.Please advise and he would like for it to be sent to ht e [facility] on [address]

## 2021-09-27 NOTE — Chronic Care Management (AMB) (Signed)
Chronic Care Management Pharmacy Note  09/27/2021 Name:  Victor Daniels MRN:  [id number] DOB:  14-Apr-1946   Subjective: Victor Daniels is an 75 y.o. year old male who is a primary patient of Tullo, Aris Everts, MD.  The CCM team was consulted for assistance with disease management and care coordination needs.    Engaged with patient by telephone for  medication access  in response to provider referral for pharmacy case management and/or care coordination services.   Consent to Services:  The patient was given information about Chronic Care Management services, agreed to services, and gave verbal consent prior to initiation of services.  Please see initial visit note for detailed documentation.   Patient Care Team: Crecencio Mc, MD as PCP - General (Internal Medicine) Rockey Situ Kathlene November, MD as PCP - Cardiology (Cardiology) Crecencio Mc, MD as Referring Physician (Internal Medicine) Lucky Cowboy Erskine Squibb, MD as Surgeon (Surgery) Wilhelmina Mcardle, MD (Inactive) as Consulting Physician (Pulmonary Disease) De Hollingshead, RPH-CPP as Pharmacist (Pharmacist)   Objective:  Lab Results  Component Value Date   CREATININE 1.75 (H) 09/22/2021   CREATININE 1.42 08/17/2021   CREATININE 1.71 (H) 07/29/2021    Lab Results  Component Value Date   HGBA1C 6.1 (H) 02/11/2021   Last diabetic Eye exam:  Lab Results  Component Value Date/Time   HMDIABEYEEXA No Retinopathy 08/14/2017 12:00 AM    Last diabetic Foot exam: No results found for: HMDIABFOOTEX      Component Value Date/Time   CHOL 103 02/11/2021 1430   TRIG 54 02/11/2021 1430   HDL 37 (L) 02/11/2021 1430   CHOLHDL 2.8 02/11/2021 1430   VLDL 20.8 03/15/2020 1051   LDLCALC 53 02/11/2021 1430   LDLDIRECT 72.0 12/01/2016 1459    Hepatic Function Latest Ref Rng & Units 07/29/2021 06/10/2021 05/29/2021  Total Protein 6.1 - 8.1 g/dL 7.6 8.0 8.0  Albumin 3.5 - 5.0 g/dL - 2.5(L) 2.3(L)  AST 10 - 35 U/L _0 ALT 9  - 46 U/L _1 Alk Phosphatase 38 - 126 U/L - 101 82  Total Bilirubin 0.2 - 1.2 mg/dL 0.5 0.7 1.3(H)    Lab Results  Component Value Date/Time   TSH 2.16 07/29/2021 02:19 PM   TSH 1.84 05/26/2021 11:50 AM   FREET4 0.84 06/09/2013 12:05 PM    CBC Latest Ref Rng & Units 06/10/2021 05/31/2021 05/30/2021  WBC 4.0 - 10.5 K/uL 6.8 7.8 8.5  Hemoglobin 13.0 - 17.0 g/dL 8.9(L) 9.9(L) 9.8(L)  Hematocrit 39.0 - 52.0 % 29.9(L) 32.2(L) 32.2(L)  Platelets 150 - 400 K/uL 275 195 193    Lab Results  Component Value Date/Time   VD25OH 21.04 (L) 02/23/2016 02:05 PM   VD25OH 31.68 06/25/2015 09:38 AM    Social History   Tobacco Use  Smoking Status Former   Packs/day: 2.00   Years: 25.00   Pack years: 50.00   Types: Cigarettes   Quit date: 09/26/2009   Years since quitting: 12.0  Smokeless Tobacco Never   BP Readings from Last 3 Encounters:  07/27/21 (!) 100/58  06/10/21 (!) 141/80  05/31/21 (!) 145/88   Pulse Readings from Last 3 Encounters:  07/27/21 (!) 101  06/10/21 74  05/31/21 97   Wt Readings from Last 3 Encounters:  07/27/21 227 lb (103 kg)  06/10/21 217 lb 13 oz (98.8 kg)  05/30/21 217 lb 13 oz (98.8 kg)    Assessment: Review of patient past medical history,  allergies, medications, health status, including review of consultants reports, laboratory and other test data, was performed as part of comprehensive evaluation and provision of chronic care management services.   SDOH:  (Social Determinants of Health) assessments and interventions performed:  SDOH Interventions    Flowsheet Row Most Recent Value  SDOH Interventions   Financial Strain Interventions Other (Comment)  [jannsen select]       CCM Care Plan  Allergies  Allergen Reactions   Clonidine Derivatives     Reaction unknown   Sulfa Antibiotics     Reaction unknown    Medications Reviewed Today     Reviewed by Earlyne Iba, CMA (Certified Medical Assistant) on 07/27/21 at 1638  Med List Status:  <None>   Medication Order Taking? Sig Documenting Provider Last Dose Status Informant  albuterol (PROVENTIL) (2.5 MG/3ML) 0.083% nebulizer solution [id number] Yes Take 3 mLs (2.5 mg total) by nebulization every 6 (six) hours as needed for wheezing or shortness of breath. Loletha Grayer, MD Taking Active Self  Fluticasone-Umeclidin-Vilant (TRELEGY ELLIPTA) 100-62.5-25 MCG/INH AEPB [id number] Yes Inhale 1 puff into the lungs daily. Crecencio Mc, MD Taking Active Self  furosemide (LASIX) 40 MG tablet [id number] Yes Take 1 tablet (40 mg total) by mouth 2 (two) times daily. McLean-Scocuzza, Nino Glow, MD Taking Active Self  ipratropium-albuterol (DUONEB) 0.5-2.5 (3) MG/3ML SOLN [id number] Yes USE 1 VIAL IN NEBULIZER EVERY 6 HOURS AS NEEDED FOR WHEEZING Crecencio Mc, MD Taking Active Self  losartan (COZAAR) 25 MG tablet [id number] Yes Take 1 tablet (25 mg total) by mouth daily. Crecencio Mc, MD Taking Active Self  mupirocin ointment (BACTROBAN) 2 % [id number] Yes Apply 1 application topically 2 (two) times daily. McLean-Scocuzza, Nino Glow, MD Taking Active Self  nitroGLYCERIN (NITROSTAT) 0.4 MG SL tablet [id number] Yes Place 1 tablet (0.4 mg total) under the tongue every 5 (five) minutes as needed for chest pain. Maximum dose 3 tablets Crecencio Mc, MD Taking Active            Med Note ([place] Jun 28, 2021 11:57 AM)    OXYGEN [id number] Yes Inhale 2 L into the lungs 3 (three) times daily as needed (shortness of breath).  [provider] Taking Active Self  rivaroxaban (XARELTO) 20 MG TABS tablet [id number] Yes Take 1 tablet (20 mg total) by mouth daily with supper. Crecencio Mc, MD Taking Active   simvastatin (ZOCOR) 20 MG tablet [id number] Yes Take 1 tablet (20 mg total) by mouth at bedtime. Crecencio Mc, MD Taking Active Self  spironolactone (ALDACTONE) 50 MG tablet [id number] Yes Take 1 tablet (50 mg total) by mouth daily. In am McLean-Scocuzza, Nino Glow, MD Taking Active  Self  traZODone (DESYREL) 50 MG tablet [id number] No Take 1 tablet (50 mg total) by mouth at bedtime.  Patient not taking: Reported on 07/27/2021   Crecencio Mc, MD Not Taking Consider Medication Status and Discontinue Self           Med Note Nat Christen Apr 14, 2021  1:38 PM) Taking PRN  VENTOLIN HFA 108 (90 Base) MCG/ACT inhaler [id number] Yes INHALE 2 PUFFS BY MOUTH EVERY 6 HOURS AS NEEDED FOR WHEEZING OR SHORTNESS OF BREATH Crecencio Mc, MD Taking Active Self  vitamin B-12 (CYANOCOBALAMIN) 1000 MCG tablet [id number] Yes Take 1,000 mcg by mouth daily. [provider] Taking Active             Patient Active  Problem List   Diagnosis Date Noted   Accidental drug overdose 06/14/2021   Cellulitis of left lower extremity 05/29/2021   Wound infection 05/29/2021   Chronic diastolic CHF (congestive heart failure) ([place]) 05/29/2021   Hypokalemia 05/29/2021   Lymphedema 05/26/2021   Abnormal gait 05/26/2021   Insomnia due to psychological stress 03/26/2021   Failure to thrive syndrome, adult 03/23/2021   DVT (deep venous thrombosis) (HCC)    Leukocytosis    Hematuria, gross 07/19/2020   Acquired thrombophilia ([place]) 07/04/2020   Atrial fibrillation ([place])    CKD (chronic kidney disease), stage IIIa 03/30/2020   Degenerative joint disease of shoulder region 09/26/2018   Obstructive sleep apnea 11/22/2016   Hypogonadism male 07/07/2016   Obesity 06/03/2016   Reduced libido 05/11/2016   Visit for preventive health examination 03/01/2016   Diabetes mellitus with circulatory complication ([place]) 54/27/0623   Shoulder pain, left 08/03/2015   Pneumonia 04/13/2015   Cervical radiculopathy due to degenerative joint disease of spine 03/24/2015   Iron deficiency anemia 02/25/2015   Generalized weakness 02/25/2015   Nocturia 02/25/2015   Pernicious anemia 01/14/2015   S/P hip replacement 12/12/2014   S/p nephrectomy 09/07/2014   Vertigo, central 08/20/2014   Senile  purpura ([place]) 06/28/2014   Incomplete emptying of bladder 10/08/2013   Anemia 09/11/2013   Chest pain with high risk for cardiac etiology 04/08/2013   Benign localized prostatic hyperplasia with lower urinary tract symptoms (LUTS) 10/23/2012   Sciatica 10/23/2012   Venous insufficiency (chronic) (peripheral) 07/08/2012   Venous insufficiency    History of alcohol abuse    Vasomotor rhinitis 12/06/2011   CAD (coronary artery disease) 10/12/2011   COPD (chronic obstructive pulmonary disease) ([place])    CHF with left ventricular diastolic dysfunction, NYHA class 2 ([place])    Hyperlipidemia    Hypertension     Immunization History  Administered Date(s) Administered   Influenza Split 10/09/2011, 12/03/2012   Influenza, High Dose Seasonal PF 09/01/2016, 09/12/2017, 12/20/2018   Influenza,inj,Quad PF,6+ Mos 09/10/2013, 09/19/2014   PFIZER(Purple Top)SARS-COV-2 Vaccination 10/01/2020, 11/02/2020   Pneumococcal Conjugate-13 02/23/2016   Pneumococcal Polysaccharide-23 02/21/2010, 12/20/2018   Td 05/26/2021   Tdap 05/25/2010    Conditions to be addressed/monitored: Atrial Fibrillation, CHF, CAD, and COPD  Care Plan : PharmD - Medication Management  Updates made by De Hollingshead, RPH-CPP since 09/27/2021 12:00 AM     Problem: Heart Failure, COPD      Long-Range Goal: Disease Progression Prevention   This Visit's Progress: On track  Recent Progress: On track  Priority: High  Note:   Current Barriers:  Unable to independently monitor therapeutic efficacy Complex patient with multiple comorbidities including heart failure, COPD  Pharmacist Clinical Goal(s):  Over the next 90 days, patient will maintain control of heart failure as evidenced by no unnecessary hospitalizations Over the next 90 days, patient will adhere to prescribed medication regimen.  Interventions: 1:1 collaboration with Crecencio Mc, MD regarding development and update of comprehensive plan of care as  evidenced by provider attestation and co-signature Inter-disciplinary care team collaboration (see longitudinal plan of care) Comprehensive medication review performed; medication list updated in electronic medical record  Atrial Fibrillation: Appropriately managed; current rate/rhythm control: none, limited by bradycardia; anticoagulant treatment: Xarelto 20 mg daily Reports he has hit the Medicare Coverage Gap, copay is now too expensive.  Reviewed [facility] Select program that allows for a flat monthly fee of $85 for a 30 day supply. Provided phone number [phone]). He is to call and  re-enroll for this year. He will ask them to overnight the prescription has he only has a few days left. He will let us know if a new prescription is needed immediately (script from last year may still be in date in their system).   Patient asked why he can't just stop the Xarelto. Reviewed stroke risk due to atrial fibrillation  Heart Failure, recovered with Atrial Fibrillation  Appropriately managed; current treatment;  ARNI/ACEi/ARB: losartan 25 mg QPM Beta blocker: none, limited by bradycardia SGLT2: consider moving forward Mineralocorticoid Receptor Antagonist: spironolactone 50 mg daily Diuretic: furosemide 40 mg BID Most recent ECHO: EF 55% Previously recommended to continue current regimen at this time  Hyperlipidemia and Secondary ASCVD Prevention - hx MI, CVA x2 Controlled per last lab work; current treatment: simvastatin 20 mg QPM No antiplatelet therapy d/t concurrent anticoagulant Previously recommended to continue current regimen at this time.   Chronic Obstructive Pulmonary Disease: Moderately well controlled; current treatment: Trelegy 100/62.5/25 mcg daily, albuterol HFA PRN or Duonebs TID; oxygen 24 hours; reports minimal need for rescue therapy lately Will review medication cost moving forward.   Chronic Pain: Moderately well managed; oxycodone 5 mg Q6H PRN Declines other  interventional therapy for pain or non-opioids.  Previously recommended to continue current regimen at this time along with collaboration with PCP  Insomnia: Moderately well controlled; current regimen: trazodone 25 mg QPM PRN, indicated improvement previously.  Previously recommend to continue current regimen at this time  Patient Goals/Self-Care Activities Over the next 90 days, patient will:  - take medications as prescribed weigh daily, and contact provider if weight gain of >3 lbs/day or 5 lbs/week collaborate with provider on medication access solutions  Follow Up Plan: Telephone follow up appointment with care management team member scheduled for: 2 days     Medication Assistance:  Collaboration with Rande Brunt select   Patient's preferred pharmacy is:  [facility] ([place] [place], [place] [address] Phone: [phone] Fax: [phone]  [facility] [place], [place] [address] Phone: [phone] Fax: [phone]  [facility] [address] ([address]), [place] - [place]) [place] [address] Phone: [phone] Fax: [phone]   Follow Up:  Patient agrees to Care Plan and Follow-up.  Plan: Telephone follow up appointment with care management team member scheduled for:  ~2 days  Catie Darnelle Maffucci, PharmD, [place], [place] Clinical Pharmacist [employer] at [employer] [phone]

## 2021-09-27 NOTE — Telephone Encounter (Signed)
See CCM documentation 

## 2021-09-27 NOTE — Patient Instructions (Signed)
Visit Information  PATIENT GOALS:  Goals Addressed               This Visit's Progress     Patient Stated     PharmD - Medication Monitoring (pt-stated)        Patient Goals/Self-Care Activities Over the next 90 days, patient will:  - take medications as prescribed weigh daily, and contact provider if weight gain of >3 lbs/day or 5 lbs/week collaborate with provider on medication access solutions         Patient verbalizes understanding of instructions provided today and agrees to view in MyChart.   Plan: Telephone follow up appointment with care management team member scheduled for:  ~2 days  Catie Feliz Beam, PharmD, [place], CPP Clinical Pharmacist [employer] at [employer] [phone]

## 2021-09-27 NOTE — Telephone Encounter (Signed)
Spoke with pt to see if he would be willing to talk to Catie, RPH to see if she can help him get this medication at cheaper cost. Pt was agreeable. He stated that he has 3 or 4 pills left.

## 2021-09-28 ENCOUNTER — Ambulatory Visit (INDEPENDENT_AMBULATORY_CARE_PROVIDER_SITE_OTHER): Payer: Medicare HMO | Admitting: Pharmacist

## 2021-09-28 ENCOUNTER — Telehealth: Payer: Self-pay | Admitting: Internal Medicine

## 2021-09-28 DIAGNOSIS — E1151 Type 2 diabetes mellitus with diabetic peripheral angiopathy without gangrene: Secondary | ICD-10-CM | POA: Diagnosis not present

## 2021-09-28 DIAGNOSIS — E1122 Type 2 diabetes mellitus with diabetic chronic kidney disease: Secondary | ICD-10-CM | POA: Diagnosis not present

## 2021-09-28 DIAGNOSIS — I130 Hypertensive heart and chronic kidney disease with heart failure and stage 1 through stage 4 chronic kidney disease, or unspecified chronic kidney disease: Secondary | ICD-10-CM | POA: Diagnosis not present

## 2021-09-28 DIAGNOSIS — D631 Anemia in chronic kidney disease: Secondary | ICD-10-CM | POA: Diagnosis not present

## 2021-09-28 DIAGNOSIS — I5032 Chronic diastolic (congestive) heart failure: Secondary | ICD-10-CM | POA: Diagnosis not present

## 2021-09-28 DIAGNOSIS — I872 Venous insufficiency (chronic) (peripheral): Secondary | ICD-10-CM | POA: Diagnosis not present

## 2021-09-28 DIAGNOSIS — E782 Mixed hyperlipidemia: Secondary | ICD-10-CM

## 2021-09-28 DIAGNOSIS — I10 Essential (primary) hypertension: Secondary | ICD-10-CM

## 2021-09-28 DIAGNOSIS — I890 Lymphedema, not elsewhere classified: Secondary | ICD-10-CM | POA: Diagnosis not present

## 2021-09-28 DIAGNOSIS — I4819 Other persistent atrial fibrillation: Secondary | ICD-10-CM

## 2021-09-28 DIAGNOSIS — N1831 Chronic kidney disease, stage 3a: Secondary | ICD-10-CM | POA: Diagnosis not present

## 2021-09-28 DIAGNOSIS — L89620 Pressure ulcer of left heel, unstageable: Secondary | ICD-10-CM | POA: Diagnosis not present

## 2021-09-28 MED ORDER — RIVAROXABAN 20 MG PO TABS: 20.0000 mg | ORAL_TABLET | Freq: Every day | ORAL | 3 refills | Status: AC

## 2021-09-28 NOTE — Telephone Encounter (Signed)
Patient calling in and states he needs an Rx for his Xarelto sent to [facility] pharmacy. Fax number is [phone].   Patient also wanting a return call from Catie to discuss his Xarelto medication as well. Patient had questions. Would not tell me the questions to document in the chart as he states they are "too involved.

## 2021-09-28 NOTE — Telephone Encounter (Signed)
Called patient. LVM for him to return my call at his convenience.

## 2021-09-28 NOTE — Patient Instructions (Signed)
Visit Information  PATIENT GOALS:  Goals Addressed               This Visit's Progress     Patient Stated     PharmD - Medication Monitoring (pt-stated)        Patient Goals/Self-Care Activities Over the next 90 days, patient will:  - take medications as prescribed weigh daily, and contact provider if weight gain of >3 lbs/day or 5 lbs/week collaborate with provider on medication access solutions         Patient verbalizes understanding of instructions provided today and agrees to view in MyChart.   Plan: Telephone follow up appointment with care management team member scheduled for:  ~2 days  Catie Medora Roorda, PharmD, BCACP, CPP Clinical Pharmacist [facility] at [place] [phone] 

## 2021-09-28 NOTE — Telephone Encounter (Signed)
Patient returned office phone call from Catie

## 2021-09-28 NOTE — Chronic Care Management (AMB) (Signed)
Chronic Care Management Pharmacy Note  09/28/2021 Name:  Victor Daniels MRN:  [id number] DOB:  09-20-1946    Subjective: Victor Daniels is an 75 y.o. year old male who is a primary patient of Tullo, Aris Everts, MD.  The CCM team was consulted for assistance with disease management and care coordination needs.    Engaged with patient by telephone for follow up visit in response to provider referral for pharmacy case management and/or care coordination services.   Consent to Services:  The patient was given information about Chronic Care Management services, agreed to services, and gave verbal consent prior to initiation of services.  Please see initial visit note for detailed documentation.   Patient Care Team: Crecencio Mc, MD as PCP - General (Internal Medicine) Rockey Situ Kathlene November, MD as PCP - Cardiology (Cardiology) Crecencio Mc, MD as Referring Physician (Internal Medicine) Lucky Cowboy Erskine Squibb, MD as Surgeon (Surgery) Wilhelmina Mcardle, MD (Inactive) as Consulting Physician (Pulmonary Disease) De Hollingshead, RPH-CPP as Pharmacist (Pharmacist)  Objective:  Lab Results  Component Value Date   CREATININE 1.75 (H) 09/22/2021   CREATININE 1.42 08/17/2021   CREATININE 1.71 (H) 07/29/2021    Lab Results  Component Value Date   HGBA1C 6.1 (H) 02/11/2021   Last diabetic Eye exam:  Lab Results  Component Value Date/Time   HMDIABEYEEXA No Retinopathy 08/14/2017 12:00 AM    Last diabetic Foot exam: No results found for: HMDIABFOOTEX      Component Value Date/Time   CHOL 103 02/11/2021 1430   TRIG 54 02/11/2021 1430   HDL 37 (L) 02/11/2021 1430   CHOLHDL 2.8 02/11/2021 1430   VLDL 20.8 03/15/2020 1051   LDLCALC 53 02/11/2021 1430   LDLDIRECT 72.0 12/01/2016 1459    Hepatic Function Latest Ref Rng & Units 07/29/2021 06/10/2021 05/29/2021  Total Protein 6.1 - 8.1 g/dL 7.6 8.0 8.0  Albumin 3.5 - 5.0 g/dL - 2.5(L) 2.3(L)  AST 10 - 35 U/L _0 ALT 9 - 46  U/L _1 Alk Phosphatase 38 - 126 U/L - 101 82  Total Bilirubin 0.2 - 1.2 mg/dL 0.5 0.7 1.3(H)    Lab Results  Component Value Date/Time   TSH 2.16 07/29/2021 02:19 PM   TSH 1.84 05/26/2021 11:50 AM   FREET4 0.84 06/09/2013 12:05 PM    CBC Latest Ref Rng & Units 06/10/2021 05/31/2021 05/30/2021  WBC 4.0 - 10.5 K/uL 6.8 7.8 8.5  Hemoglobin 13.0 - 17.0 g/dL 8.9(L) 9.9(L) 9.8(L)  Hematocrit 39.0 - 52.0 % 29.9(L) 32.2(L) 32.2(L)  Platelets 150 - 400 K/uL 275 195 193    Lab Results  Component Value Date/Time   VD25OH 21.04 (L) 02/23/2016 02:05 PM   VD25OH 31.68 06/25/2015 09:38 AM    Social History   Tobacco Use  Smoking Status Former   Packs/day: 2.00   Years: 25.00   Pack years: 50.00   Types: Cigarettes   Quit date: 09/26/2009   Years since quitting: 12.0  Smokeless Tobacco Never   BP Readings from Last 3 Encounters:  07/27/21 (!) 100/58  06/10/21 (!) 141/80  05/31/21 (!) 145/88   Pulse Readings from Last 3 Encounters:  07/27/21 (!) 101  06/10/21 74  05/31/21 97   Wt Readings from Last 3 Encounters:  07/27/21 227 lb (103 kg)  06/10/21 217 lb 13 oz (98.8 kg)  05/30/21 217 lb 13 oz (98.8 kg)    Assessment: Review of patient past medical history, allergies,  medications, health status, including review of consultants reports, laboratory and other test data, was performed as part of comprehensive evaluation and provision of chronic care management services.   SDOH:  (Social Determinants of Health) assessments and interventions performed:  SDOH Interventions    Flowsheet Row Most Recent Value  SDOH Interventions   Financial Strain Interventions Other (Comment)  [jannsen]       CCM Care Plan  Allergies  Allergen Reactions   Clonidine Derivatives     Reaction unknown   Sulfa Antibiotics     Reaction unknown    Medications Reviewed Today     Reviewed by De Hollingshead, RPH-CPP (Pharmacist) on 09/28/21 at [place] List Status: <None>    Medication Order Taking? Sig Documenting Provider Last Dose Status Informant  albuterol (PROVENTIL) (2.5 MG/3ML) 0.083% nebulizer solution [id number]  Take 3 mLs (2.5 mg total) by nebulization every 6 (six) hours as needed for wheezing or shortness of breath. Loletha Grayer, MD  Active Self  Fluticasone-Umeclidin-Vilant (TRELEGY ELLIPTA) 100-62.5-25 MCG/INH AEPB [id number]  Inhale 1 puff into the lungs daily. Crecencio Mc, MD  Active   furosemide (LASIX) 40 MG tablet [id number] Yes Take 1 tablet (40 mg total) by mouth daily. Crecencio Mc, MD Taking Active   ipratropium-albuterol (DUONEB) 0.5-2.5 (3) MG/3ML SOLN [id number]  USE 1 VIAL IN NEBULIZER EVERY 6 HOURS AS NEEDED FOR WHEEZING Crecencio Mc, MD  Active Self  losartan (COZAAR) 25 MG tablet [id number] Yes Take 1 tablet (25 mg total) by mouth daily. Crecencio Mc, MD Taking Active   mupirocin ointment (BACTROBAN) 2 % [id number]  Apply 1 application topically 2 (two) times daily. McLean-Scocuzza, Nino Glow, MD  Active Self  nitroGLYCERIN (NITROSTAT) 0.4 MG SL tablet [id number]  Place 1 tablet (0.4 mg total) under the tongue every 5 (five) minutes as needed for chest pain. Maximum dose 3 tablets Crecencio Mc, MD  Active            Med Note ([place] Jun 28, 2021 11:57 AM)    oxyCODONE (ROXICODONE) 15 MG immediate release tablet [id number]  Take 1 tablet (15 mg total) by mouth every 6 (six) hours as needed. Crecencio Mc, MD  Active   OXYGEN [id number]  Inhale 2 L into the lungs 3 (three) times daily as needed (shortness of breath).  [provider]  Active Self  rivaroxaban (XARELTO) 20 MG TABS tablet [id number] Yes Take 1 tablet (20 mg total) by mouth daily with supper. Crecencio Mc, MD Taking Active   simvastatin (ZOCOR) 20 MG tablet [id number] Yes TAKE 1 TABLET (20 MG TOTAL) BY MOUTH AT BEDTIME. Crecencio Mc, MD Taking Active   spironolactone (ALDACTONE) 50 MG tablet [id number] Yes Take 1 tablet (50 mg total)  by mouth daily. In am McLean-Scocuzza, Nino Glow, MD Taking Active   VENTOLIN HFA 108 (90 Base) MCG/ACT inhaler [id number]  INHALE 2 PUFFS BY MOUTH EVERY 6 HOURS AS NEEDED FOR WHEEZING FOR SHORTNESS OF BREATH Crecencio Mc, MD  Active   vitamin B-12 (CYANOCOBALAMIN) 1000 MCG tablet [id number]  Take 1,000 mcg by mouth daily. [provider]  Active             Patient Active Problem List   Diagnosis Date Noted   Accidental drug overdose 06/14/2021   Cellulitis of left lower extremity 05/29/2021   Wound infection 05/29/2021   Chronic diastolic CHF (congestive heart failure) ([place]) 05/29/2021   Hypokalemia 05/29/2021  Lymphedema 05/26/2021   Abnormal gait 05/26/2021   Insomnia due to psychological stress 03/26/2021   Failure to thrive syndrome, adult 03/23/2021   DVT (deep venous thrombosis) (HCC)    Leukocytosis    Hematuria, gross 07/19/2020   Acquired thrombophilia ([place]) 07/04/2020   Atrial fibrillation ([place])    CKD (chronic kidney disease), stage IIIa 03/30/2020   Degenerative joint disease of shoulder region 09/26/2018   Obstructive sleep apnea 11/22/2016   Hypogonadism male 07/07/2016   Obesity 06/03/2016   Reduced libido 05/11/2016   Visit for preventive health examination 03/01/2016   Diabetes mellitus with circulatory complication ([place]) 34/19/6222   Shoulder pain, left 08/03/2015   Pneumonia 04/13/2015   Cervical radiculopathy due to degenerative joint disease of spine 03/24/2015   Iron deficiency anemia 02/25/2015   Generalized weakness 02/25/2015   Nocturia 02/25/2015   Pernicious anemia 01/14/2015   S/P hip replacement 12/12/2014   S/p nephrectomy 09/07/2014   Vertigo, central 08/20/2014   Senile purpura ([place]) 06/28/2014   Incomplete emptying of bladder 10/08/2013   Anemia 09/11/2013   Chest pain with high risk for cardiac etiology 04/08/2013   Benign localized prostatic hyperplasia with lower urinary tract symptoms (LUTS) 10/23/2012   Sciatica  10/23/2012   Venous insufficiency (chronic) (peripheral) 07/08/2012   Venous insufficiency    History of alcohol abuse    Vasomotor rhinitis 12/06/2011   CAD (coronary artery disease) 10/12/2011   COPD (chronic obstructive pulmonary disease) ([place])    CHF with left ventricular diastolic dysfunction, NYHA class 2 ([place])    Hyperlipidemia    Hypertension     Immunization History  Administered Date(s) Administered   Influenza Split 10/09/2011, 12/03/2012   Influenza, High Dose Seasonal PF 09/01/2016, 09/12/2017, 12/20/2018   Influenza,inj,Quad PF,6+ Mos 09/10/2013, 09/19/2014   PFIZER(Purple Top)SARS-COV-2 Vaccination 10/01/2020, 11/02/2020   Pneumococcal Conjugate-13 02/23/2016   Pneumococcal Polysaccharide-23 02/21/2010, 12/20/2018   Td 05/26/2021   Tdap 05/25/2010    Conditions to be addressed/monitored: CHF, HTN, HLD, and COPD  Care Plan : PharmD - Medication Management  Updates made by De Hollingshead, RPH-CPP since 09/28/2021 12:00 AM     Problem: Heart Failure, COPD      Long-Range Goal: Disease Progression Prevention   Recent Progress: On track  Priority: High  Note:   Current Barriers:  Unable to independently monitor therapeutic efficacy Complex patient with multiple comorbidities including heart failure, COPD  Pharmacist Clinical Goal(s):  Over the next 90 days, patient will maintain control of heart failure as evidenced by no unnecessary hospitalizations Over the next 90 days, patient will adhere to prescribed medication regimen.  Interventions: 1:1 collaboration with Crecencio Mc, MD regarding development and update of comprehensive plan of care as evidenced by provider attestation and co-signature Inter-disciplinary care team collaboration (see longitudinal plan of care) Comprehensive medication review performed; medication list updated in electronic medical record  Atrial Fibrillation: Appropriately managed; current rate/rhythm control: none,  limited by bradycardia; anticoagulant treatment: Xarelto 20 mg daily Reports he has hit the Medicare Coverage Gap, copay is now too expensive.  Calls today to ask that we send Xarelto refill to [facility] pharmacy for [place] select program. Script sent today.  Heart Failure, recovered with Atrial Fibrillation  Appropriately managed; current treatment;  ARNI/ACEi/ARB: losartan 25 mg QPM Beta blocker: none, limited by bradycardia SGLT2: consider moving forward Mineralocorticoid Receptor Antagonist: spironolactone 50 mg daily Diuretic: furosemide 40 mg daily Most recent ECHO: EF 55% Reports that he is still having urinary accidents sometimes. Wants to  stop furosemide. Discussed importance of diuretic regimen to prevent HF exacerbation. Discussed medication regimen as above with PCP. Reduce furosemide to 20 mg daily but patient can hold until after trips to stores.   Hyperlipidemia and Secondary ASCVD Prevention - hx MI, CVA x2 Controlled per last lab work; current treatment: simvastatin 20 mg QPM No antiplatelet therapy d/t concurrent anticoagulant Previously recommended to continue current regimen at this time.   Chronic Obstructive Pulmonary Disease: Moderately well controlled; current treatment: Trelegy 100/62.5/25 mcg daily, albuterol HFA PRN or Duonebs TID; oxygen 24 hours; reports minimal need for rescue therapy lately Will review medication cost moving forward.   Chronic Pain: Moderately well managed; oxycodone 5 mg Q6H PRN Declines other interventional therapy for pain or non-opioids.  Previously recommended to continue current regimen at this time along with collaboration with PCP  Insomnia: Moderately well controlled; current regimen: trazodone 25 mg QPM PRN, indicated improvement previously.  Previously recommend to continue current regimen at this time  Patient Goals/Self-Care Activities Over the next 90 days, patient will:  - take medications as prescribed weigh  daily, and contact provider if weight gain of >3 lbs/day or 5 lbs/week collaborate with provider on medication access solutions  Follow Up Plan: Telephone follow up appointment with care management team member scheduled for: 2 days     Medication Assistance:  [place]  Patient's preferred pharmacy is:  [facility] ([place] [place], [place] [address] Phone: [phone] Fax: [phone]  [facility] [place], [place] [address] Phone: [phone] Fax: [phone]  [facility] [place] [address]) [place] [address] Phone: [phone] Fax: [phone]  [id number] [place] [address] [place] [address] Phone: [phone] Fax: [phone]   Follow Up:  Patient agrees to Care Plan and Follow-up.  Plan: Telephone follow up appointment with care management team member scheduled for:  2 days  Catie Darnelle Maffucci, PharmD, [place], [place] Clinical Pharmacist [employer] at [employer] [phone]

## 2021-09-28 NOTE — Telephone Encounter (Signed)
Script sent, will call patient later today to discuss

## 2021-09-28 NOTE — Telephone Encounter (Signed)
Called patient back again. Left voicemail.

## 2021-09-28 NOTE — Telephone Encounter (Signed)
Xarelto issue resolved.   Patient does have concerns today about urinary accidents. He wonders if he can stop (or reduce) his furosemide. Reports he was at the bank today and had to pee 3 times, once he had an accident because he couldn't get there fast enough.   Reviewed medications (his neighbor, Gigi Gin, was also there to review as she fills his pill box). He has been taking losartan 25 mg, spironolactone 50 mg, and furosemide 40 mg. After BMP on 8/24, he was instructed to hold losartan and spironolactone. Repeat BMP on 9/29 showed worsening kidney function, but unclear now whether he had held losartan or spironolactone at that time. Since 9/29 BMP results were given to him, he has reduced furosemide to once daily and notes he has not gained weight.   Reports his BP was checked when he was here for lab work on 8/29 and it was "perfect".   Will route to PCP and CMA to advise on patient's urinary frequency issues, what regimen he should be on, and follow up plan. He declines scheduling follow up with Dr. Mariah Milling.     Of note - would love to start SGLT2 in this patient for CKD and HF benefit (and maybe allow for reduction in diuretic dose), but per our prior conversations he is over income for assistance for Marcelline Deist and he is in the Coverage Gap now and would be unable to afford a brand copay.

## 2021-09-29 NOTE — Telephone Encounter (Signed)
LMTCB

## 2021-10-03 ENCOUNTER — Ambulatory Visit: Payer: Medicare HMO | Admitting: Pharmacist

## 2021-10-03 DIAGNOSIS — I5030 Unspecified diastolic (congestive) heart failure: Secondary | ICD-10-CM

## 2021-10-03 DIAGNOSIS — J431 Panlobular emphysema: Secondary | ICD-10-CM

## 2021-10-03 DIAGNOSIS — E782 Mixed hyperlipidemia: Secondary | ICD-10-CM

## 2021-10-03 DIAGNOSIS — R600 Localized edema: Secondary | ICD-10-CM

## 2021-10-03 DIAGNOSIS — I4819 Other persistent atrial fibrillation: Secondary | ICD-10-CM

## 2021-10-03 DIAGNOSIS — I10 Essential (primary) hypertension: Secondary | ICD-10-CM

## 2021-10-03 DIAGNOSIS — I25118 Atherosclerotic heart disease of native coronary artery with other forms of angina pectoris: Secondary | ICD-10-CM

## 2021-10-03 MED ORDER — FUROSEMIDE 20 MG PO TABS: 20.0000 mg | ORAL_TABLET | Freq: Every day | ORAL | 2 refills | Status: DC

## 2021-10-03 NOTE — Patient Instructions (Signed)
Visit Information  PATIENT GOALS:  Goals Addressed               This Visit's Progress     Patient Stated     PharmD - Medication Monitoring (pt-stated)        Patient Goals/Self-Care Activities Over the next 90 days, patient will:  - take medications as prescribed weigh daily, and contact provider if weight gain of >3 lbs/day or 5 lbs/week collaborate with provider on medication access solutions          Patient verbalizes understanding of instructions provided today and agrees to view in MyChart.  Plan: Telephone follow up appointment with care management team member scheduled for:  8 weeks  Catie Laster Appling, PharmD, BCACP, CPP Clinical Pharmacist [facility] at [place] [phone] 

## 2021-10-03 NOTE — Chronic Care Management (AMB) (Signed)
Chronic Care Management Pharmacy Note  10/03/2021 Name:  Victor Daniels MRN:  [id number] DOB:  1946/11/23   Subjective: Victor Daniels is an 75 y.o. year old male who is a primary patient of Tullo, Aris Everts, MD.  The CCM team was consulted for assistance with disease management and care coordination needs.    Engaged with patient by telephone for  medication management question  in response to provider referral for pharmacy case management and/or care coordination services.   Consent to Services:  The patient was given information about Chronic Care Management services, agreed to services, and gave verbal consent prior to initiation of services.  Please see initial visit note for detailed documentation.   Patient Care Team: Crecencio Mc, MD as PCP - General (Internal Medicine) Rockey Situ Kathlene November, MD as PCP - Cardiology (Cardiology) Crecencio Mc, MD as Referring Physician (Internal Medicine) Lucky Cowboy Erskine Squibb, MD as Surgeon (Surgery) Wilhelmina Mcardle, MD (Inactive) as Consulting Physician (Pulmonary Disease) De Hollingshead, RPH-CPP as Pharmacist (Pharmacist)   Objective:  Lab Results  Component Value Date   CREATININE 1.75 (H) 09/22/2021   CREATININE 1.42 08/17/2021   CREATININE 1.71 (H) 07/29/2021    Lab Results  Component Value Date   HGBA1C 6.1 (H) 02/11/2021   Last diabetic Eye exam:  Lab Results  Component Value Date/Time   HMDIABEYEEXA No Retinopathy 08/14/2017 12:00 AM    Last diabetic Foot exam: No results found for: HMDIABFOOTEX      Component Value Date/Time   CHOL 103 02/11/2021 1430   TRIG 54 02/11/2021 1430   HDL 37 (L) 02/11/2021 1430   CHOLHDL 2.8 02/11/2021 1430   VLDL 20.8 03/15/2020 1051   LDLCALC 53 02/11/2021 1430   LDLDIRECT 72.0 12/01/2016 1459    Hepatic Function Latest Ref Rng & Units 07/29/2021 06/10/2021 05/29/2021  Total Protein 6.1 - 8.1 g/dL 7.6 8.0 8.0  Albumin 3.5 - 5.0 g/dL - 2.5(L) 2.3(L)  AST 10 - 35 U/L '21  23 22  ' ALT 9 - 46 U/L '15 25 19  ' Alk Phosphatase 38 - 126 U/L - 101 82  Total Bilirubin 0.2 - 1.2 mg/dL 0.5 0.7 1.3(H)    Lab Results  Component Value Date/Time   TSH 2.16 07/29/2021 02:19 PM   TSH 1.84 05/26/2021 11:50 AM   FREET4 0.84 06/09/2013 12:05 PM    CBC Latest Ref Rng & Units 06/10/2021 05/31/2021 05/30/2021  WBC 4.0 - 10.5 K/uL 6.8 7.8 8.5  Hemoglobin 13.0 - 17.0 g/dL 8.9(L) 9.9(L) 9.8(L)  Hematocrit 39.0 - 52.0 % 29.9(L) 32.2(L) 32.2(L)  Platelets 150 - 400 K/uL 275 195 193    Lab Results  Component Value Date/Time   VD25OH 21.04 (L) 02/23/2016 02:05 PM   VD25OH 31.68 06/25/2015 09:38 AM    Social History   Tobacco Use  Smoking Status Former   Packs/day: 2.00   Years: 25.00   Pack years: 50.00   Types: Cigarettes   Quit date: 09/26/2009   Years since quitting: 12.0  Smokeless Tobacco Never   BP Readings from Last 3 Encounters:  07/27/21 (!) 100/58  06/10/21 (!) 141/80  05/31/21 (!) 145/88   Pulse Readings from Last 3 Encounters:  07/27/21 (!) 101  06/10/21 74  05/31/21 97   Wt Readings from Last 3 Encounters:  07/27/21 227 lb (103 kg)  06/10/21 217 lb 13 oz (98.8 kg)  05/30/21 217 lb 13 oz (98.8 kg)    Assessment: Review of patient past medical  history, allergies, medications, health status, including review of consultants reports, laboratory and other test data, was performed as part of comprehensive evaluation and provision of chronic care management services.   SDOH:  (Social Determinants of Health) assessments and interventions performed:  SDOH Interventions    Flowsheet Row Most Recent Value  SDOH Interventions   Financial Strain Interventions Other (Comment)  [[facility] select program]       CCM Care Plan  Allergies  Allergen Reactions   Clonidine Derivatives     Reaction unknown   Sulfa Antibiotics     Reaction unknown    Medications Reviewed Today     Reviewed by De Hollingshead, RPH-CPP (Pharmacist) on 09/28/21 at [facility]  List Status: <None>   Medication Order Taking? Sig Documenting Provider Last Dose Status Informant  albuterol (PROVENTIL) (2.5 MG/3ML) 0.083% nebulizer solution [id number]  Take 3 mLs (2.5 mg total) by nebulization every 6 (six) hours as needed for wheezing or shortness of breath. Loletha Grayer, MD  Active Self  Fluticasone-Umeclidin-Vilant (TRELEGY ELLIPTA) 100-62.5-25 MCG/INH AEPB [id number]  Inhale 1 puff into the lungs daily. Crecencio Mc, MD  Active   furosemide (LASIX) 40 MG tablet [id number] Yes Take 1 tablet (40 mg total) by mouth daily. Crecencio Mc, MD Taking Active   ipratropium-albuterol (DUONEB) 0.5-2.5 (3) MG/3ML SOLN [id number]  USE 1 VIAL IN NEBULIZER EVERY 6 HOURS AS NEEDED FOR WHEEZING Crecencio Mc, MD  Active Self  losartan (COZAAR) 25 MG tablet [id number] Yes Take 1 tablet (25 mg total) by mouth daily. Crecencio Mc, MD Taking Active   mupirocin ointment (BACTROBAN) 2 % [id number]  Apply 1 application topically 2 (two) times daily. McLean-Scocuzza, Nino Glow, MD  Active Self  nitroGLYCERIN (NITROSTAT) 0.4 MG SL tablet [id number]  Place 1 tablet (0.4 mg total) under the tongue every 5 (five) minutes as needed for chest pain. Maximum dose 3 tablets Crecencio Mc, MD  Active            Med Note ([place] Jun 28, 2021 11:57 AM)    oxyCODONE (ROXICODONE) 15 MG immediate release tablet [id number]  Take 1 tablet (15 mg total) by mouth every 6 (six) hours as needed. Crecencio Mc, MD  Active   OXYGEN [id number]  Inhale 2 L into the lungs 3 (three) times daily as needed (shortness of breath).  [provider]  Active Self  rivaroxaban (XARELTO) 20 MG TABS tablet [id number] Yes Take 1 tablet (20 mg total) by mouth daily with supper. Crecencio Mc, MD Taking Active   simvastatin (ZOCOR) 20 MG tablet [id number] Yes TAKE 1 TABLET (20 MG TOTAL) BY MOUTH AT BEDTIME. Crecencio Mc, MD Taking Active   spironolactone (ALDACTONE) 50 MG tablet [id number] Yes Take 1  tablet (50 mg total) by mouth daily. In am McLean-Scocuzza, Nino Glow, MD Taking Active   VENTOLIN HFA 108 (90 Base) MCG/ACT inhaler [id number]  INHALE 2 PUFFS BY MOUTH EVERY 6 HOURS AS NEEDED FOR WHEEZING FOR SHORTNESS OF BREATH Crecencio Mc, MD  Active   vitamin B-12 (CYANOCOBALAMIN) 1000 MCG tablet [id number]  Take 1,000 mcg by mouth daily. [provider]  Active             Patient Active Problem List   Diagnosis Date Noted   Accidental drug overdose 06/14/2021   Cellulitis of left lower extremity 05/29/2021   Wound infection 05/29/2021   Chronic diastolic CHF (congestive heart failure) ([place]) 05/29/2021  Hypokalemia 05/29/2021   Lymphedema 05/26/2021   Abnormal gait 05/26/2021   Insomnia due to psychological stress 03/26/2021   Failure to thrive syndrome, adult 03/23/2021   DVT (deep venous thrombosis) (HCC)    Leukocytosis    Hematuria, gross 07/19/2020   Acquired thrombophilia ([place]) 07/04/2020   Atrial fibrillation ([place])    CKD (chronic kidney disease), stage IIIa 03/30/2020   Degenerative joint disease of shoulder region 09/26/2018   Obstructive sleep apnea 11/22/2016   Hypogonadism male 07/07/2016   Obesity 06/03/2016   Reduced libido 05/11/2016   Visit for preventive health examination 03/01/2016   Diabetes mellitus with circulatory complication ([place]) 03/55/9741   Shoulder pain, left 08/03/2015   Pneumonia 04/13/2015   Cervical radiculopathy due to degenerative joint disease of spine 03/24/2015   Iron deficiency anemia 02/25/2015   Generalized weakness 02/25/2015   Nocturia 02/25/2015   Pernicious anemia 01/14/2015   S/P hip replacement 12/12/2014   S/p nephrectomy 09/07/2014   Vertigo, central 08/20/2014   Senile purpura ([place]) 06/28/2014   Incomplete emptying of bladder 10/08/2013   Anemia 09/11/2013   Chest pain with high risk for cardiac etiology 04/08/2013   Benign localized prostatic hyperplasia with lower urinary tract symptoms (LUTS)  10/23/2012   Sciatica 10/23/2012   Venous insufficiency (chronic) (peripheral) 07/08/2012   Venous insufficiency    History of alcohol abuse    Vasomotor rhinitis 12/06/2011   CAD (coronary artery disease) 10/12/2011   COPD (chronic obstructive pulmonary disease) ([place])    CHF with left ventricular diastolic dysfunction, NYHA class 2 ([place])    Hyperlipidemia    Hypertension     Immunization History  Administered Date(s) Administered   Influenza Split 10/09/2011, 12/03/2012   Influenza, High Dose Seasonal PF 09/01/2016, 09/12/2017, 12/20/2018   Influenza,inj,Quad PF,6+ Mos 09/10/2013, 09/19/2014   PFIZER(Purple Top)SARS-COV-2 Vaccination 10/01/2020, 11/02/2020   Pneumococcal Conjugate-13 02/23/2016   Pneumococcal Polysaccharide-23 02/21/2010, 12/20/2018   Td 05/26/2021   Tdap 05/25/2010    Conditions to be addressed/monitored: Atrial Fibrillation, CHF, and COPD  Care Plan : PharmD - Medication Management  Updates made by De Hollingshead, RPH-CPP since 10/03/2021 12:00 AM     Problem: Heart Failure, COPD      Long-Range Goal: Disease Progression Prevention   This Visit's Progress: On track  Recent Progress: On track  Priority: High  Note:   Current Barriers:  Unable to independently monitor therapeutic efficacy Complex patient with multiple comorbidities including heart failure, COPD  Pharmacist Clinical Goal(s):  Over the next 90 days, patient will maintain control of heart failure as evidenced by no unnecessary hospitalizations Over the next 90 days, patient will adhere to prescribed medication regimen.  Interventions: 1:1 collaboration with Crecencio Mc, MD regarding development and update of comprehensive plan of care as evidenced by provider attestation and co-signature Inter-disciplinary care team collaboration (see longitudinal plan of care) Comprehensive medication review performed; medication list updated in electronic medical record  Atrial  Fibrillation: Appropriately managed; current rate/rhythm control: none, limited by bradycardia; anticoagulant treatment: Xarelto 20 mg daily Reports he received supply from [facility] program.  Recommended to continue current regimen at this time  Heart Failure, recovered with Atrial Fibrillation  Appropriately managed; current treatment;  ARNI/ACEi/ARB: losartan 25 mg QPM Beta blocker: none, limited by bradycardia SGLT2: consider moving forward Mineralocorticoid Receptor Antagonist: spironolactone 50 mg daily Diuretic: furosemide 40 mg daily Most recent ECHO: EF 55% Reviewed PCP recommendation to reduce furosemide to 20 mg daily, take after he has run errands for  the day. He verbalizes understanding. Script for 20 mg tablets sent to [facility] in case 20 mg tablets are difficult to split.   Hyperlipidemia and Secondary ASCVD Prevention - hx MI, CVA x2 Controlled per last lab work; current treatment: simvastatin 20 mg QPM No antiplatelet therapy d/t concurrent anticoagulant Previously recommended to continue current regimen at this time.   Chronic Obstructive Pulmonary Disease: Moderately well controlled; current treatment: Trelegy 100/62.5/25 mcg daily, albuterol HFA PRN or Duonebs TID; oxygen 24 hours; reports minimal need for rescue therapy lately Reports he forgot where this medication came from. Reviewed that he gets this from [facility] order. Provided phone number for him to call and request refill. Advised to call me with any cost concerns. If so, will evaluate if he qualifies for patient assistance .  Chronic Pain: Moderately well managed; oxycodone 5 mg Q6H PRN Declines other interventional therapy for pain or non-opioids.  Previously recommended to continue current regimen at this time along with collaboration with PCP  Insomnia: Moderately well controlled; current regimen: trazodone 25 mg QPM PRN, indicated improvement previously.   Previously recommend to continue current regimen at this time  Patient Goals/Self-Care Activities Over the next 90 days, patient will:  - take medications as prescribed weigh daily, and contact provider if weight gain of >3 lbs/day or 5 lbs/week collaborate with provider on medication access solutions  Follow Up Plan: Telephone follow up appointment with care management team member scheduled for: 8 weeks     Medication Assistance: None required.  Patient affirms current coverage meets needs.  Patient's preferred pharmacy is:  [facility] ([facility] [place], [place] [address] Phone: [phone] Fax: [phone]  [facility] [place], [place] [address] Phone: [phone] Fax: [phone]  [facility] [address] [place] - [place]) [place] [address] Phone: [phone] Fax: [phone]  [facility] - [place] [address] [place] [address] Phone: [phone] Fax: [phone]    Follow Up:  Patient agrees to Care Plan and Follow-up.  Plan: Telephone follow up appointment with care management team member scheduled for:  ~ 8 weeks  Catie Darnelle Maffucci, PharmD, [place], [place] Clinical Pharmacist [employer] at [employer] [phone]

## 2021-10-03 NOTE — Telephone Encounter (Signed)
Reviewed the below with patient today. Scheduled f/u with PCP in office next month.

## 2021-10-05 ENCOUNTER — Ambulatory Visit: Payer: Medicare HMO | Admitting: Pharmacist

## 2021-10-05 ENCOUNTER — Telehealth: Payer: Self-pay | Admitting: Pharmacist

## 2021-10-05 DIAGNOSIS — I5030 Unspecified diastolic (congestive) heart failure: Secondary | ICD-10-CM

## 2021-10-05 DIAGNOSIS — J431 Panlobular emphysema: Secondary | ICD-10-CM

## 2021-10-05 DIAGNOSIS — I25118 Atherosclerotic heart disease of native coronary artery with other forms of angina pectoris: Secondary | ICD-10-CM

## 2021-10-05 MED ORDER — BREZTRI AEROSPHERE 160-9-4.8 MCG/ACT IN AERO: 2.0000 | INHALATION_SPRAY | Freq: Two times a day (BID) | RESPIRATORY_TRACT | 11 refills | Status: DC

## 2021-10-05 NOTE — Patient Instructions (Signed)
Visit Information  PATIENT GOALS:  Goals Addressed               This Visit's Progress     Patient Stated     PharmD - Medication Monitoring (pt-stated)        Patient Goals/Self-Care Activities Over the next 90 days, patient will:  - take medications as prescribed weigh daily, and contact provider if weight gain of >3 lbs/day or 5 lbs/week collaborate with provider on medication access solutions          Patient verbalizes understanding of instructions provided today and agrees to view in MyChart.  Plan: Telephone follow up appointment with care management team member scheduled for:  8 weeks  Catie Fifi Schindler, PharmD, BCACP, CPP Clinical Pharmacist [facility] at [place] [phone] 

## 2021-10-05 NOTE — Telephone Encounter (Signed)
Medication Samples have been labeled and logged for the patient.  Drug name: Breztri       Strength: 160/4.8/9 mcg        Qty: 4  LOT: [id number]  Exp.Date: 03/24/24  Dosing instructions: Inhale 2 puffs twice daily  The patient has been instructed regarding the correct time, dose, and frequency of taking this medication, including desired effects and most common side effects.   Victor Daniels 5:02 PM 10/05/2021

## 2021-10-05 NOTE — Chronic Care Management (AMB) (Signed)
Chronic Care Management Pharmacy Note  10/05/2021 Name:  JONLUKE COBBINS MRN:  [id number] DOB:  1946-03-12   Subjective: RICK CARRUTHERS is an 75 y.o. year old male who is a primary patient of Tullo, Aris Everts, MD.  The CCM team was consulted for assistance with disease management and care coordination needs.    Engaged with patient by telephone for  medication access  in response to provider referral for pharmacy case management and/or care coordination services.   Consent to Services:  The patient was given information about Chronic Care Management services, agreed to services, and gave verbal consent prior to initiation of services.  Please see initial visit note for detailed documentation.   Patient Care Team: Crecencio Mc, MD as PCP - General (Internal Medicine) Rockey Situ Kathlene November, MD as PCP - Cardiology (Cardiology) Crecencio Mc, MD as Referring Physician (Internal Medicine) Lucky Cowboy Erskine Squibb, MD as Surgeon (Surgery) Wilhelmina Mcardle, MD (Inactive) as Consulting Physician (Pulmonary Disease) De Hollingshead, RPH-CPP as Pharmacist (Pharmacist)  Objective:  Lab Results  Component Value Date   CREATININE 1.75 (H) 09/22/2021   CREATININE 1.42 08/17/2021   CREATININE 1.71 (H) 07/29/2021    Lab Results  Component Value Date   HGBA1C 6.1 (H) 02/11/2021   Last diabetic Eye exam:  Lab Results  Component Value Date/Time   HMDIABEYEEXA No Retinopathy 08/14/2017 12:00 AM    Last diabetic Foot exam: No results found for: HMDIABFOOTEX      Component Value Date/Time   CHOL 103 02/11/2021 1430   TRIG 54 02/11/2021 1430   HDL 37 (L) 02/11/2021 1430   CHOLHDL 2.8 02/11/2021 1430   VLDL 20.8 03/15/2020 1051   LDLCALC 53 02/11/2021 1430   LDLDIRECT 72.0 12/01/2016 1459    Hepatic Function Latest Ref Rng & Units 07/29/2021 06/10/2021 05/29/2021  Total Protein 6.1 - 8.1 g/dL 7.6 8.0 8.0  Albumin 3.5 - 5.0 g/dL - 2.5(L) 2.3(L)  AST 10 - 35 U/L _0 ALT 9 -  46 U/L _1 Alk Phosphatase 38 - 126 U/L - 101 82  Total Bilirubin 0.2 - 1.2 mg/dL 0.5 0.7 1.3(H)    Lab Results  Component Value Date/Time   TSH 2.16 07/29/2021 02:19 PM   TSH 1.84 05/26/2021 11:50 AM   FREET4 0.84 06/09/2013 12:05 PM    CBC Latest Ref Rng & Units 06/10/2021 05/31/2021 05/30/2021  WBC 4.0 - 10.5 K/uL 6.8 7.8 8.5  Hemoglobin 13.0 - 17.0 g/dL 8.9(L) 9.9(L) 9.8(L)  Hematocrit 39.0 - 52.0 % 29.9(L) 32.2(L) 32.2(L)  Platelets 150 - 400 K/uL 275 195 193    Lab Results  Component Value Date/Time   VD25OH 21.04 (L) 02/23/2016 02:05 PM   VD25OH 31.68 06/25/2015 09:38 AM     Social History   Tobacco Use  Smoking Status Former   Packs/day: 2.00   Years: 25.00   Pack years: 50.00   Types: Cigarettes   Quit date: 09/26/2009   Years since quitting: 12.0  Smokeless Tobacco Never   BP Readings from Last 3 Encounters:  07/27/21 (!) 100/58  06/10/21 (!) 141/80  05/31/21 (!) 145/88   Pulse Readings from Last 3 Encounters:  07/27/21 (!) 101  06/10/21 74  05/31/21 97   Wt Readings from Last 3 Encounters:  07/27/21 227 lb (103 kg)  06/10/21 217 lb 13 oz (98.8 kg)  05/30/21 217 lb 13 oz (98.8 kg)    Assessment: Review of patient past medical history,  allergies, medications, health status, including review of consultants reports, laboratory and other test data, was performed as part of comprehensive evaluation and provision of chronic care management services.   SDOH:  (Social Determinants of Health) assessments and interventions performed:  SDOH Interventions    Flowsheet Row Most Recent Value  SDOH Interventions   Financial Strain Interventions Other (Comment)  [manufacturer assistance]       CCM Care Plan  Allergies  Allergen Reactions   Clonidine Derivatives     Reaction unknown   Sulfa Antibiotics     Reaction unknown    Medications Reviewed Today     Reviewed by De Hollingshead, RPH-CPP (Pharmacist) on 09/28/21 at [place] List Status:  <None>   Medication Order Taking? Sig Documenting Provider Last Dose Status Informant  albuterol (PROVENTIL) (2.5 MG/3ML) 0.083% nebulizer solution [id number]  Take 3 mLs (2.5 mg total) by nebulization every 6 (six) hours as needed for wheezing or shortness of breath. Loletha Grayer, MD  Active Self  Fluticasone-Umeclidin-Vilant (TRELEGY ELLIPTA) 100-62.5-25 MCG/INH AEPB [id number]  Inhale 1 puff into the lungs daily. Crecencio Mc, MD  Active   furosemide (LASIX) 40 MG tablet [id number] Yes Take 1 tablet (40 mg total) by mouth daily. Crecencio Mc, MD Taking Active   ipratropium-albuterol (DUONEB) 0.5-2.5 (3) MG/3ML SOLN [id number]  USE 1 VIAL IN NEBULIZER EVERY 6 HOURS AS NEEDED FOR WHEEZING Crecencio Mc, MD  Active Self  losartan (COZAAR) 25 MG tablet [id number] Yes Take 1 tablet (25 mg total) by mouth daily. Crecencio Mc, MD Taking Active   mupirocin ointment (BACTROBAN) 2 % [id number]  Apply 1 application topically 2 (two) times daily. McLean-Scocuzza, Nino Glow, MD  Active Self  nitroGLYCERIN (NITROSTAT) 0.4 MG SL tablet [id number]  Place 1 tablet (0.4 mg total) under the tongue every 5 (five) minutes as needed for chest pain. Maximum dose 3 tablets Crecencio Mc, MD  Active            Med Note ([place] Jun 28, 2021 11:57 AM)    oxyCODONE (ROXICODONE) 15 MG immediate release tablet [id number]  Take 1 tablet (15 mg total) by mouth every 6 (six) hours as needed. Crecencio Mc, MD  Active   OXYGEN [id number]  Inhale 2 L into the lungs 3 (three) times daily as needed (shortness of breath).  [provider]  Active Self  rivaroxaban (XARELTO) 20 MG TABS tablet [id number] Yes Take 1 tablet (20 mg total) by mouth daily with supper. Crecencio Mc, MD Taking Active   simvastatin (ZOCOR) 20 MG tablet [id number] Yes TAKE 1 TABLET (20 MG TOTAL) BY MOUTH AT BEDTIME. Crecencio Mc, MD Taking Active   spironolactone (ALDACTONE) 50 MG tablet [id number] Yes Take 1 tablet (50 mg  total) by mouth daily. In am McLean-Scocuzza, Nino Glow, MD Taking Active   VENTOLIN HFA 108 (90 Base) MCG/ACT inhaler [id number]  INHALE 2 PUFFS BY MOUTH EVERY 6 HOURS AS NEEDED FOR WHEEZING FOR SHORTNESS OF BREATH Crecencio Mc, MD  Active   vitamin B-12 (CYANOCOBALAMIN) 1000 MCG tablet [id number]  Take 1,000 mcg by mouth daily. [provider]  Active             Patient Active Problem List   Diagnosis Date Noted   Accidental drug overdose 06/14/2021   Cellulitis of left lower extremity 05/29/2021   Wound infection 05/29/2021   Chronic diastolic CHF (congestive heart failure) ([facility]) 05/29/2021   Hypokalemia  05/29/2021   Lymphedema 05/26/2021   Abnormal gait 05/26/2021   Insomnia due to psychological stress 03/26/2021   Failure to thrive syndrome, adult 03/23/2021   DVT (deep venous thrombosis) (HCC)    Leukocytosis    Hematuria, gross 07/19/2020   Acquired thrombophilia ([facility]) 07/04/2020   Atrial fibrillation ([place])    CKD (chronic kidney disease), stage IIIa 03/30/2020   Degenerative joint disease of shoulder region 09/26/2018   Obstructive sleep apnea 11/22/2016   Hypogonadism male 07/07/2016   Obesity 06/03/2016   Reduced libido 05/11/2016   Visit for preventive health examination 03/01/2016   Diabetes mellitus with circulatory complication ([place]) 59/45/8592   Shoulder pain, left 08/03/2015   Pneumonia 04/13/2015   Cervical radiculopathy due to degenerative joint disease of spine 03/24/2015   Iron deficiency anemia 02/25/2015   Generalized weakness 02/25/2015   Nocturia 02/25/2015   Pernicious anemia 01/14/2015   S/P hip replacement 12/12/2014   S/p nephrectomy 09/07/2014   Vertigo, central 08/20/2014   Senile purpura ([place]) 06/28/2014   Incomplete emptying of bladder 10/08/2013   Anemia 09/11/2013   Chest pain with high risk for cardiac etiology 04/08/2013   Benign localized prostatic hyperplasia with lower urinary tract symptoms (LUTS) 10/23/2012   Sciatica  10/23/2012   Venous insufficiency (chronic) (peripheral) 07/08/2012   Venous insufficiency    History of alcohol abuse    Vasomotor rhinitis 12/06/2011   CAD (coronary artery disease) 10/12/2011   COPD (chronic obstructive pulmonary disease) ([place])    CHF with left ventricular diastolic dysfunction, NYHA class 2 ([place])    Hyperlipidemia    Hypertension     Immunization History  Administered Date(s) Administered   Influenza Split 10/09/2011, 12/03/2012   Influenza, High Dose Seasonal PF 09/01/2016, 09/12/2017, 12/20/2018   Influenza,inj,Quad PF,6+ Mos 09/10/2013, 09/19/2014   PFIZER(Purple Top)SARS-COV-2 Vaccination 10/01/2020, 11/02/2020   Pneumococcal Conjugate-13 02/23/2016   Pneumococcal Polysaccharide-23 02/21/2010, 12/20/2018   Td 05/26/2021   Tdap 05/25/2010    Conditions to be addressed/monitored: Atrial Fibrillation, CHF, and COPD  Care Plan : PharmD - Medication Management  Updates made by De Hollingshead, RPH-CPP since 10/05/2021 12:00 AM     Problem: Heart Failure, COPD      Long-Range Goal: Disease Progression Prevention   Recent Progress: On track  Priority: High  Note:   Current Barriers:  Unable to independently monitor therapeutic efficacy Complex patient with multiple comorbidities including heart failure, COPD  Pharmacist Clinical Goal(s):  Over the next 90 days, patient will maintain control of heart failure as evidenced by no unnecessary hospitalizations Over the next 90 days, patient will adhere to prescribed medication regimen.  Interventions: 1:1 collaboration with Crecencio Mc, MD regarding development and update of comprehensive plan of care as evidenced by provider attestation and co-signature Inter-disciplinary care team collaboration (see longitudinal plan of care) Comprehensive medication review performed; medication list updated in electronic medical record  Atrial Fibrillation: Appropriately managed; current rate/rhythm control:  none, limited by bradycardia; anticoagulant treatment: Xarelto 20 mg daily Reports he received supply from [facility] program.  Recommended to continue current regimen at this time.  Heart Failure, recovered with Atrial Fibrillation  Appropriately managed; current treatment;  ARNI/ACEi/ARB: losartan 25 mg QPM Beta blocker: none, limited by bradycardia SGLT2: consider moving forward Mineralocorticoid Receptor Antagonist: spironolactone 50 mg daily Diuretic: furosemide 40 mg daily Most recent ECHO: EF 55% Reviewed PCP recommendation to reduce furosemide to 20 mg daily, take after he has run errands for the day. He verbalizes understanding. Script for  20 mg tablets sent to [facility] in case 20 mg tablets are difficult to split.   Hyperlipidemia and Secondary ASCVD Prevention - hx MI, CVA x2 Controlled per last lab work; current treatment: simvastatin 20 mg QPM No antiplatelet therapy d/t concurrent anticoagulant Previously recommended to continue current regimen at this time.   Chronic Obstructive Pulmonary Disease: Moderately well controlled; current treatment: Trelegy 100/62.5/25 mcg daily, albuterol HFA PRN or Duonebs TID; oxygen 24 hours; reports minimal need for rescue therapy lately Calls today and reports Trelegy is now >$400 as he is in the coverage gap.  Reviewed income. He meets income criteria for Medicare Extra Help, but is over the income limit. For Trelegy assistance, patient would need to provide proof of income and proof of out of pocket spend on medications. He is not sure if he receives EOBs from [facility] and receives medications from different pharmacies. He is also not sure where his social security awards letter might be. Breztri assistance does not require proof of income or proof of out of pocket spend, so it would be an easier program to access. Patient amenable. Stop Trelegy, start Breztri 160/4.8/9 mcg 2 puffs twice daily. Patient will pick up a  sample here at the office and complete paperwork for assistance. Will collaborate w/ PCP on assistance paperwork.   Chronic Pain: Moderately well managed; oxycodone 5 mg Q6H PRN Declines other interventional therapy for pain or non-opioids.  Previously recommended to continue current regimen at this time along with collaboration with PCP  Insomnia: Moderately well controlled; current regimen: trazodone 25 mg QPM PRN, indicated improvement previously.  Previously recommend to continue current regimen at this time  Patient Goals/Self-Care Activities Over the next 90 days, patient will:  - take medications as prescribed weigh daily, and contact provider if weight gain of >3 lbs/day or 5 lbs/week collaborate with provider on medication access solutions  Follow Up Plan: Telephone follow up appointment with care management team member scheduled for: 8 weeks     Medication Assistance: Application for Breztri  medication assistance program. in process.  Anticipated assistance start date TBD.  See plan of care for additional detail.  Patient's preferred pharmacy is:  [facility] ([place]) [address], [place] [address] Phone: [phone] Fax: [phone]  [facility] [place], [address] [place] [address] Phone: [phone] Fax: [phone]  [facility] [address]), [place] - [place]) [place] [address] Phone: [phone] Fax: [phone]  [id number] [place] [address] [place] [address] Phone: [phone] Fax: [phone]   Follow Up:  Patient agrees to Care Plan and Follow-up.  Plan: Telephone follow up appointment with care management team member scheduled for:  8 weeks  Catie Darnelle Maffucci, PharmD, [place],  [place] Clinical Pharmacist [employer] [facility] [employer] [phone]

## 2021-10-06 ENCOUNTER — Ambulatory Visit: Payer: Medicare HMO | Admitting: Pharmacist

## 2021-10-06 DIAGNOSIS — J431 Panlobular emphysema: Secondary | ICD-10-CM

## 2021-10-06 DIAGNOSIS — I5030 Unspecified diastolic (congestive) heart failure: Secondary | ICD-10-CM

## 2021-10-06 NOTE — Chronic Care Management (AMB) (Signed)
Chronic Care Management Pharmacy Note  10/06/2021 Name:  Victor Daniels MRN:  [id number] DOB:  14-Nov-1946   Subjective: Victor Daniels is an 75 y.o. year old male who is a primary patient of Tullo, Aris Everts, MD.  The CCM team was consulted for assistance with disease management and care coordination needs.    Care coordination  for  medication access  in response to provider referral for pharmacy case management and/or care coordination services.   Consent to Services:  The patient was given information about Chronic Care Management services, agreed to services, and gave verbal consent prior to initiation of services.  Please see initial visit note for detailed documentation.   Patient Care Team: Crecencio Mc, MD as PCP - General (Internal Medicine) Rockey Situ Kathlene November, MD as PCP - Cardiology (Cardiology) Crecencio Mc, MD as Referring Physician (Internal Medicine) Lucky Cowboy Erskine Squibb, MD as Surgeon (Surgery) Wilhelmina Mcardle, MD (Inactive) as Consulting Physician (Pulmonary Disease) De Hollingshead, RPH-CPP as Pharmacist (Pharmacist)   Objective:  Lab Results  Component Value Date   CREATININE 1.75 (H) 09/22/2021   CREATININE 1.42 08/17/2021   CREATININE 1.71 (H) 07/29/2021    Lab Results  Component Value Date   HGBA1C 6.1 (H) 02/11/2021   Last diabetic Eye exam:  Lab Results  Component Value Date/Time   HMDIABEYEEXA No Retinopathy 08/14/2017 12:00 AM    Last diabetic Foot exam: No results found for: HMDIABFOOTEX      Component Value Date/Time   CHOL 103 02/11/2021 1430   TRIG 54 02/11/2021 1430   HDL 37 (L) 02/11/2021 1430   CHOLHDL 2.8 02/11/2021 1430   VLDL 20.8 03/15/2020 1051   LDLCALC 53 02/11/2021 1430   LDLDIRECT 72.0 12/01/2016 1459    Hepatic Function Latest Ref Rng & Units 07/29/2021 06/10/2021 05/29/2021  Total Protein 6.1 - 8.1 g/dL 7.6 8.0 8.0  Albumin 3.5 - 5.0 g/dL - 2.5(L) 2.3(L)  AST 10 - 35 U/L _0 ALT 9 - 46 U/L _1 Alk Phosphatase 38 - 126 U/L - 101 82  Total Bilirubin 0.2 - 1.2 mg/dL 0.5 0.7 1.3(H)    Lab Results  Component Value Date/Time   TSH 2.16 07/29/2021 02:19 PM   TSH 1.84 05/26/2021 11:50 AM   FREET4 0.84 06/09/2013 12:05 PM    CBC Latest Ref Rng & Units 06/10/2021 05/31/2021 05/30/2021  WBC 4.0 - 10.5 K/uL 6.8 7.8 8.5  Hemoglobin 13.0 - 17.0 g/dL 8.9(L) 9.9(L) 9.8(L)  Hematocrit 39.0 - 52.0 % 29.9(L) 32.2(L) 32.2(L)  Platelets 150 - 400 K/uL 275 195 193    Lab Results  Component Value Date/Time   VD25OH 21.04 (L) 02/23/2016 02:05 PM   VD25OH 31.68 06/25/2015 09:38 AM     Social History   Tobacco Use  Smoking Status Former   Packs/day: 2.00   Years: 25.00   Pack years: 50.00   Types: Cigarettes   Quit date: 09/26/2009   Years since quitting: 12.0  Smokeless Tobacco Never   BP Readings from Last 3 Encounters:  07/27/21 (!) 100/58  06/10/21 (!) 141/80  05/31/21 (!) 145/88   Pulse Readings from Last 3 Encounters:  07/27/21 (!) 101  06/10/21 74  05/31/21 97   Wt Readings from Last 3 Encounters:  07/27/21 227 lb (103 kg)  06/10/21 217 lb 13 oz (98.8 kg)  05/30/21 217 lb 13 oz (98.8 kg)    Assessment: Review of patient past medical history, allergies,  medications, health status, including review of consultants reports, laboratory and other test data, was performed as part of comprehensive evaluation and provision of chronic care management services.   SDOH:  (Social Determinants of Health) assessments and interventions performed:  SDOH Interventions    Flowsheet Row Most Recent Value  SDOH Interventions   Financial Strain Interventions Other (Comment)  [manufacturer assistance]       CCM Care Plan  Allergies  Allergen Reactions   Clonidine Derivatives     Reaction unknown   Sulfa Antibiotics     Reaction unknown    Medications Reviewed Today     Reviewed by De Hollingshead, RPH-CPP (Pharmacist) on 09/28/21 at [place] List Status: <None>    Medication Order Taking? Sig Documenting Provider Last Dose Status Informant  albuterol (PROVENTIL) (2.5 MG/3ML) 0.083% nebulizer solution [id number]  Take 3 mLs (2.5 mg total) by nebulization every 6 (six) hours as needed for wheezing or shortness of breath. Loletha Grayer, MD  Active Self  Fluticasone-Umeclidin-Vilant (TRELEGY ELLIPTA) 100-62.5-25 MCG/INH AEPB [id number]  Inhale 1 puff into the lungs daily. Crecencio Mc, MD  Active   furosemide (LASIX) 40 MG tablet [id number] Yes Take 1 tablet (40 mg total) by mouth daily. Crecencio Mc, MD Taking Active   ipratropium-albuterol (DUONEB) 0.5-2.5 (3) MG/3ML SOLN [id number]  USE 1 VIAL IN NEBULIZER EVERY 6 HOURS AS NEEDED FOR WHEEZING Crecencio Mc, MD  Active Self  losartan (COZAAR) 25 MG tablet [id number] Yes Take 1 tablet (25 mg total) by mouth daily. Crecencio Mc, MD Taking Active   mupirocin ointment (BACTROBAN) 2 % [id number]  Apply 1 application topically 2 (two) times daily. McLean-Scocuzza, Nino Glow, MD  Active Self  nitroGLYCERIN (NITROSTAT) 0.4 MG SL tablet [id number]  Place 1 tablet (0.4 mg total) under the tongue every 5 (five) minutes as needed for chest pain. Maximum dose 3 tablets Crecencio Mc, MD  Active            Med Note ([place] Jun 28, 2021 11:57 AM)    oxyCODONE (ROXICODONE) 15 MG immediate release tablet [id number]  Take 1 tablet (15 mg total) by mouth every 6 (six) hours as needed. Crecencio Mc, MD  Active   OXYGEN [id number]  Inhale 2 L into the lungs 3 (three) times daily as needed (shortness of breath).  [provider]  Active Self  rivaroxaban (XARELTO) 20 MG TABS tablet [id number] Yes Take 1 tablet (20 mg total) by mouth daily with supper. Crecencio Mc, MD Taking Active   simvastatin (ZOCOR) 20 MG tablet [id number] Yes TAKE 1 TABLET (20 MG TOTAL) BY MOUTH AT BEDTIME. Crecencio Mc, MD Taking Active   spironolactone (ALDACTONE) 50 MG tablet [id number] Yes Take 1 tablet (50 mg total)  by mouth daily. In am McLean-Scocuzza, Nino Glow, MD Taking Active   VENTOLIN HFA 108 (90 Base) MCG/ACT inhaler [id number]  INHALE 2 PUFFS BY MOUTH EVERY 6 HOURS AS NEEDED FOR WHEEZING FOR SHORTNESS OF BREATH Crecencio Mc, MD  Active   vitamin B-12 (CYANOCOBALAMIN) 1000 MCG tablet [id number]  Take 1,000 mcg by mouth daily. [provider]  Active             Patient Active Problem List   Diagnosis Date Noted   Accidental drug overdose 06/14/2021   Cellulitis of left lower extremity 05/29/2021   Wound infection 05/29/2021   Chronic diastolic CHF (congestive heart failure) ([place]) 05/29/2021   Hypokalemia 05/29/2021  Lymphedema 05/26/2021   Abnormal gait 05/26/2021   Insomnia due to psychological stress 03/26/2021   Failure to thrive syndrome, adult 03/23/2021   DVT (deep venous thrombosis) (HCC)    Leukocytosis    Hematuria, gross 07/19/2020   Acquired thrombophilia ([place]) 07/04/2020   Atrial fibrillation ([place])    CKD (chronic kidney disease), stage IIIa 03/30/2020   Degenerative joint disease of shoulder region 09/26/2018   Obstructive sleep apnea 11/22/2016   Hypogonadism male 07/07/2016   Obesity 06/03/2016   Reduced libido 05/11/2016   Visit for preventive health examination 03/01/2016   Diabetes mellitus with circulatory complication ([place]) 16/09/9603   Shoulder pain, left 08/03/2015   Pneumonia 04/13/2015   Cervical radiculopathy due to degenerative joint disease of spine 03/24/2015   Iron deficiency anemia 02/25/2015   Generalized weakness 02/25/2015   Nocturia 02/25/2015   Pernicious anemia 01/14/2015   S/P hip replacement 12/12/2014   S/p nephrectomy 09/07/2014   Vertigo, central 08/20/2014   Senile purpura ([place]) 06/28/2014   Incomplete emptying of bladder 10/08/2013   Anemia 09/11/2013   Chest pain with high risk for cardiac etiology 04/08/2013   Benign localized prostatic hyperplasia with lower urinary tract symptoms (LUTS) 10/23/2012   Sciatica  10/23/2012   Venous insufficiency (chronic) (peripheral) 07/08/2012   Venous insufficiency    History of alcohol abuse    Vasomotor rhinitis 12/06/2011   CAD (coronary artery disease) 10/12/2011   COPD (chronic obstructive pulmonary disease) ([place])    CHF with left ventricular diastolic dysfunction, NYHA class 2 ([place])    Hyperlipidemia    Hypertension     Immunization History  Administered Date(s) Administered   Influenza Split 10/09/2011, 12/03/2012   Influenza, High Dose Seasonal PF 09/01/2016, 09/12/2017, 12/20/2018   Influenza,inj,Quad PF,6+ Mos 09/10/2013, 09/19/2014   PFIZER(Purple Top)SARS-COV-2 Vaccination 10/01/2020, 11/02/2020   Pneumococcal Conjugate-13 02/23/2016   Pneumococcal Polysaccharide-23 02/21/2010, 12/20/2018   Td 05/26/2021   Tdap 05/25/2010    Conditions to be addressed/monitored: Atrial Fibrillation, CHF, and COPD  Care Plan : PharmD - Medication Management  Updates made by De Hollingshead, RPH-CPP since 10/06/2021 12:00 AM     Problem: Heart Failure, COPD      Long-Range Goal: Disease Progression Prevention   Recent Progress: On track  Priority: High  Note:   Current Barriers:  Unable to independently monitor therapeutic efficacy Complex patient with multiple comorbidities including heart failure, COPD  Pharmacist Clinical Goal(s):  Over the next 90 days, patient will maintain control of heart failure as evidenced by no unnecessary hospitalizations Over the next 90 days, patient will adhere to prescribed medication regimen.  Interventions: 1:1 collaboration with Crecencio Mc, MD regarding development and update of comprehensive plan of care as evidenced by provider attestation and co-signature Inter-disciplinary care team collaboration (see longitudinal plan of care) Comprehensive medication review performed; medication list updated in electronic medical record  Atrial Fibrillation: Appropriately managed; current rate/rhythm control:  none, limited by bradycardia; anticoagulant treatment: Xarelto 20 mg daily Reports he received supply from [facility] program.  Previously recommended to continue current regimen at this time.  Heart Failure, recovered with Atrial Fibrillation  Appropriately managed; current treatment;  ARNI/ACEi/ARB: losartan 25 mg QPM Beta blocker: none, limited by bradycardia SGLT2: consider moving forward Mineralocorticoid Receptor Antagonist: spironolactone 50 mg daily Diuretic: furosemide 20 mg daily Most recent ECHO: EF 55% Previously recommended to continue current regimen at this time  Hyperlipidemia and Secondary ASCVD Prevention - hx MI, CVA x2 Controlled per last lab work; current  treatment: simvastatin 20 mg QPM No antiplatelet therapy d/t concurrent anticoagulant Previously recommended to continue current regimen at this time.   Chronic Obstructive Pulmonary Disease: Moderately well controlled; current treatment: Trelegy 100/62.5/25 mcg daily, albuterol HFA PRN or Duonebs TID; oxygen 24 hours; reports minimal need for rescue therapy lately Received patient and provider portion of application. Submitted to [employer] today.  Chronic Pain: Moderately well managed; oxycodone 5 mg Q6H PRN Declines other interventional therapy for pain or non-opioids.  Previously recommended to continue current regimen at this time along with collaboration with PCP  Insomnia: Moderately well controlled; current regimen: trazodone 25 mg QPM PRN, indicated improvement previously.  Previously recommend to continue current regimen at this time  Patient Goals/Self-Care Activities Over the next 90 days, patient will:  - take medications as prescribed weigh daily, and contact provider if weight gain of >3 lbs/day or 5 lbs/week collaborate with provider on medication access solutions  Follow Up Plan: Telephone follow up appointment with care management team member scheduled for: 8  weeks     Medication Assistance: Application for Breztri  medication assistance program. in process.  Anticipated assistance start date TBD.  See plan of care for additional detail.  Patient's preferred pharmacy is:  [facility] [place] [address] Phone: [phone] Fax: [phone]  [facility] [place], [place] [address] Phone: [phone] Fax: [phone]  [facility] [address] [place] - [place] [address] Phone: [phone] Fax: [phone]  [facility] [place] [address] [place] [address] Phone: [phone] Fax: [phone]    Follow Up:  Patient agrees to Care Plan and Follow-up.  Plan: Telephone follow up appointment with care management team member scheduled for:  ~ 8 weeks  Catie Darnelle Maffucci, PharmD, [place], [place] Clinical Pharmacist [employer] at [employer] [phone]

## 2021-10-06 NOTE — Patient Instructions (Signed)
Visit Information  PATIENT GOALS:  Goals Addressed               This Visit's Progress     Patient Stated     PharmD - Medication Monitoring (pt-stated)        Patient Goals/Self-Care Activities Over the next 90 days, patient will:  - take medications as prescribed weigh daily, and contact provider if weight gain of >3 lbs/day or 5 lbs/week collaborate with provider on medication access solutions          Patient verbalizes understanding of instructions provided today and agrees to view in MyChart.  Plan: Telephone follow up appointment with care management team member scheduled for:  8 weeks  Catie Burdette Forehand, PharmD, BCACP, CPP Clinical Pharmacist [facility] at [place] [phone] 

## 2021-10-11 ENCOUNTER — Telehealth: Payer: Self-pay | Admitting: Pharmacy Technician

## 2021-10-11 DIAGNOSIS — Z596 Low income: Secondary | ICD-10-CM

## 2021-10-11 NOTE — Progress Notes (Signed)
[facility])                                            [facility] Quality Pharmacy Team    10/11/2021  Victor Daniels Feb 01, 1946 [id number]  Care coordination call placed to [employer] in regards to [name] application.  Spoke to [place] who informed patient would need to apply for LIS/Medicaid and be denied in order to be enrolled into their program. She informed the denial letter could be faxed to [employer] once received. In the meantime, she informed they would send out a temporary 90 days supply of medication while the patient awaited a determination. She informed he can expect that to be delivered in the next 7-10 business days to home address.  In basket message sent to embedded PharmD to assist patient with LIS and to inform about the arrival of his temporary supply.  Finola Rosal P. Nilton Lave, CPhT [facility]  [phone]

## 2021-10-17 ENCOUNTER — Ambulatory Visit: Payer: Medicare HMO | Admitting: Pharmacist

## 2021-10-17 ENCOUNTER — Telehealth: Payer: Self-pay | Admitting: Pharmacist

## 2021-10-17 DIAGNOSIS — I5030 Unspecified diastolic (congestive) heart failure: Secondary | ICD-10-CM

## 2021-10-17 DIAGNOSIS — J431 Panlobular emphysema: Secondary | ICD-10-CM

## 2021-10-17 DIAGNOSIS — I10 Essential (primary) hypertension: Secondary | ICD-10-CM

## 2021-10-17 DIAGNOSIS — E782 Mixed hyperlipidemia: Secondary | ICD-10-CM

## 2021-10-17 DIAGNOSIS — I25118 Atherosclerotic heart disease of native coronary artery with other forms of angina pectoris: Secondary | ICD-10-CM

## 2021-10-17 MED ORDER — TRELEGY ELLIPTA 100-62.5-25 MCG/ACT IN AEPB: 1.0000 | INHALATION_SPRAY | Freq: Every day | RESPIRATORY_TRACT | 0 refills | Status: AC

## 2021-10-17 NOTE — Patient Instructions (Signed)
Visit Information  PATIENT GOALS:  Goals Addressed               This Visit's Progress     Patient Stated     PharmD - Medication Monitoring (pt-stated)        Patient Goals/Self-Care Activities Over the next 90 days, patient will:  - take medications as prescribed weigh daily, and contact provider if weight gain of >3 lbs/day or 5 lbs/week collaborate with provider on medication access solutions          Patient verbalizes understanding of instructions provided today and agrees to view in MyChart.  Plan: Telephone follow up appointment with care management team member scheduled for:  8 weeks  Catie Feliz Beam, PharmD, [place], CPP Clinical Pharmacist [employer] at [employer] [phone]

## 2021-10-17 NOTE — Chronic Care Management (AMB) (Signed)
Chronic Care Management Pharmacy Note  10/17/2021 Name:  Victor Daniels MRN:  [id number] DOB:  06-27-1946  Subjective: Victor Daniels is an 75 y.o. year old male who is a primary patient of Tullo, Mar Daring, MD.  The CCM team was consulted for assistance with disease management and care coordination needs.    Engaged with patient by telephone for  medication access f/u  for pharmacy case management and/or care coordination services.   Objective:  Medications Reviewed Today     Reviewed by Lourena Simmonds, RPH-CPP (Pharmacist) on 09/28/21 at 1606  Med List Status: <None>   Medication Order Taking? Sig Documenting Provider Last Dose Status Informant  albuterol (PROVENTIL) (2.5 MG/3ML) 0.083% nebulizer solution [id number]  Take 3 mLs (2.5 mg total) by nebulization every 6 (six) hours as needed for wheezing or shortness of breath. Alford Highland, MD  Active Self  Fluticasone-Umeclidin-Vilant (TRELEGY ELLIPTA) 100-62.5-25 MCG/INH AEPB [id number]  Inhale 1 puff into the lungs daily. [name], MD  Active   furosemide (LASIX) 40 MG tablet [id number] Yes Take 1 tablet (40 mg total) by mouth daily. [name], MD Taking Active   ipratropium-albuterol (DUONEB) 0.5-2.5 (3) MG/3ML SOLN [id number]  USE 1 VIAL IN NEBULIZER EVERY 6 HOURS AS NEEDED FOR WHEEZING [name], MD  Active Self  losartan (COZAAR) 25 MG tablet [id number] Yes Take 1 tablet (25 mg total) by mouth daily. [name], MD Taking Active   mupirocin ointment (BACTROBAN) 2 % [id number]  Apply 1 application topically 2 (two) times daily. McLean-Scocuzza, Pasty Spillers, MD  Active Self  nitroGLYCERIN (NITROSTAT) 0.4 MG SL tablet [id number]  Place 1 tablet (0.4 mg total) under the tongue every 5 (five) minutes as needed for chest pain. Maximum dose 3 tablets [name], MD  Active            Med Note Victor Daniels   Tue Jun 28, 2021 11:57 AM)    oxyCODONE (ROXICODONE) 15 MG immediate release tablet  [id number]  Take 1 tablet (15 mg total) by mouth every 6 (six) hours as needed. [name], MD  Active   OXYGEN [id number]  Inhale 2 L into the lungs 3 (three) times daily as needed (shortness of breath).  [provider]  Active Self  rivaroxaban (XARELTO) 20 MG TABS tablet [id number] Yes Take 1 tablet (20 mg total) by mouth daily with supper. [name], MD Taking Active   simvastatin (ZOCOR) 20 MG tablet [id number] Yes TAKE 1 TABLET (20 MG TOTAL) BY MOUTH AT BEDTIME. [name], MD Taking Active   spironolactone (ALDACTONE) 50 MG tablet [id number] Yes Take 1 tablet (50 mg total) by mouth daily. In am McLean-Scocuzza, Pasty Spillers, MD Taking Active   VENTOLIN HFA 108 (90 Base) MCG/ACT inhaler [id number]  INHALE 2 PUFFS BY MOUTH EVERY 6 HOURS AS NEEDED FOR WHEEZING FOR SHORTNESS OF BREATH [name], MD  Active   vitamin B-12 (CYANOCOBALAMIN) 1000 MCG tablet [id number]  Take 1,000 mcg by mouth daily. [provider]  Active               Assessment/Interventions: Review of patient past medical history, allergies, medications, health status, including review of consultants reports, laboratory and other test data, was performed as part of comprehensive evaluation and provision of chronic care management services.   SDOH:  (Social Determinants of Health) assessments and interventions performed: Yes SDOH Interventions    Flowsheet Row Most Recent Value  SDOH Interventions   Financial Strain Interventions Other (Comment)  [Tier Exception]        CCM Care Plan  Review of patient past medical history, allergies, medications, health status, including review of consultants reports, laboratory and other test data, was performed as part of comprehensive evaluation and provision of chronic care management services.   Conditions to be addressed/monitored:  COPD  Care Plan : PharmD - Medication Management  Updates made by Lourena Simmonds, RPH-CPP since 10/17/2021  12:00 AM     Problem: Heart Failure, COPD      Long-Range Goal: Disease Progression Prevention   Recent Progress: On track  Priority: High  Note:   Current Barriers:  Unable to independently monitor therapeutic efficacy Complex patient with multiple comorbidities including heart failure, COPD  Pharmacist Clinical Goal(s):  Over the next 90 days, patient will maintain control of heart failure as evidenced by no unnecessary hospitalizations Over the next 90 days, patient will adhere to prescribed medication regimen.  Interventions: 1:1 collaboration with [name], MD regarding development and update of comprehensive plan of care as evidenced by provider attestation and co-signature Inter-disciplinary care team collaboration (see longitudinal plan of care) Comprehensive medication review performed; medication list updated in electronic medical record  Atrial Fibrillation: Appropriately managed; current rate/rhythm control: none, limited by bradycardia; anticoagulant treatment: Xarelto 20 mg daily Reports he received supply from [facility] pharmacy program.  Previously recommended to continue current regimen at this time.  Heart Failure, recovered with Atrial Fibrillation  Appropriately managed; current treatment;  ARNI/ACEi/ARB: losartan 25 mg QPM Beta blocker: none, limited by bradycardia SGLT2: consider moving forward Mineralocorticoid Receptor Antagonist: spironolactone 50 mg daily Diuretic: furosemide 20 mg daily Most recent ECHO: EF 55% Previously recommended to continue current regimen at this time  Hyperlipidemia and Secondary ASCVD Prevention - hx MI, CVA x2 Controlled per last lab work; current treatment: simvastatin 20 mg QPM No antiplatelet therapy d/t concurrent anticoagulant Previously recommended to continue current regimen at this time.   Chronic Obstructive Pulmonary Disease: Moderately well controlled; current treatment: Trelegy  100/62.5/25 mcg daily, albuterol HFA PRN or Duonebs TID; oxygen 24 hours; reports minimal need for rescue therapy lately Patient reports greater benefit from Trelegy than Breztri. Unable to fully assess administration over the phone, but patient requests to switch back to Trelegy. Can provide samples and attempt Tier Exception. Tier exception submitted to [facility] noting trial and failure of ipratropium/albuterol per nebulizer, Combivent Respimat PRN, Albuterol 2.5% nebulizer solution, Budesonide 0.25 mg/70mL per nebulizer, Perforomist, Brovana, Montelukast, Over the counter antihistamines, Ipratropium nasal spray, fluticasone nasal spray, Breztri, Stiolto, Anoro, [place], [place], Symbicort, [place], Advair, Spiriva Stop Breztri 160/4.8/9 mcg 2 puffs BID. Restart Trelegy 100/62.5/25 mcg daily. Will prepare sample to pick up.   Chronic Pain: Moderately well managed; oxycodone 5 mg Q6H PRN Declines other interventional therapy for pain or non-opioids.  Previously recommended to continue current regimen at this time along with collaboration with PCP  Insomnia: Moderately well controlled; current regimen: trazodone 25 mg QPM PRN, indicated improvement previously.  Previously recommend to continue current regimen at this time  Patient Goals/Self-Care Activities Over the next 90 days, patient will:  - take medications as prescribed weigh daily, and contact provider if weight gain of >3 lbs/day or 5 lbs/week collaborate with provider on medication access solutions  Follow Up Plan: Telephone follow up appointment with care management team member scheduled for: 8 weeks      Plan: Telephone follow up appointment with care management team  member scheduled for:  8 weeks  Catie Victor Beam, PharmD, [place], CPP Clinical Pharmacist [employer] at [employer] [phone]

## 2021-10-17 NOTE — Telephone Encounter (Signed)
Medication Samples have been labeled and logged for the patient.  Drug name: Trelegy       Strength: 100/62.5/25        Qty: 2 boxes  LOT: [id number]  Exp.Date: 02/2023  Dosing instructions: Inhale 1 puff daily  The patient has been instructed regarding the correct time, dose, and frequency of taking this medication, including desired effects and most common side effects.   Lourena Simmonds 3:02 PM 10/17/2021

## 2021-10-17 NOTE — Addendum Note (Signed)
Addended by: Lourena Simmonds on: 10/17/2021 03:04 PM   Modules accepted: Orders

## 2021-10-18 NOTE — Telephone Encounter (Signed)
Medications have been picked up by pt's friend/caregiver.

## 2021-10-19 ENCOUNTER — Telehealth: Payer: Self-pay | Admitting: Internal Medicine

## 2021-10-19 DIAGNOSIS — R600 Localized edema: Secondary | ICD-10-CM

## 2021-10-19 NOTE — Telephone Encounter (Signed)
Is it okay to type up a letter for [facility] to go out and wrap pt's legs due to leg swelling.

## 2021-10-19 NOTE — Telephone Encounter (Signed)
Patient is calling in to check on the status of the message below.He would like for his orders to be sent to [facility].Please call him with an update.

## 2021-10-19 NOTE — Telephone Encounter (Signed)
Patient called asking for legs to be wrapped due to swelling. Called nurse to wrap and treat them however due to needing another order she was unable to do that at this time. Please forward another order to his home health agency. Patient was informed to call the office with this information.

## 2021-10-19 NOTE — Telephone Encounter (Signed)
Letter printed and signed an in [place] folder

## 2021-10-20 NOTE — Telephone Encounter (Signed)
Letter has been faxed and pt is aware.  

## 2021-10-21 ENCOUNTER — Ambulatory Visit: Payer: Medicare HMO | Admitting: Physician Assistant

## 2021-10-21 ENCOUNTER — Other Ambulatory Visit: Payer: Self-pay

## 2021-10-21 ENCOUNTER — Encounter: Payer: Self-pay | Admitting: Physician Assistant

## 2021-10-21 VITALS — BP 149/72 | HR 97 | Temp 98.4°F | Ht 72.0 in | Wt 240.0 lb

## 2021-10-21 DIAGNOSIS — N5089 Other specified disorders of the male genital organs: Secondary | ICD-10-CM

## 2021-10-21 DIAGNOSIS — N50812 Left testicular pain: Secondary | ICD-10-CM | POA: Diagnosis not present

## 2021-10-21 DIAGNOSIS — B372 Candidiasis of skin and nail: Secondary | ICD-10-CM | POA: Diagnosis not present

## 2021-10-21 DIAGNOSIS — N50811 Right testicular pain: Secondary | ICD-10-CM | POA: Diagnosis not present

## 2021-10-21 LAB — URINALYSIS, COMPLETE
Bilirubin, UA: NEGATIVE
Ketones, UA: NEGATIVE
Nitrite, UA: NEGATIVE
Specific Gravity, UA: 1.025 (ref 1.005–1.030)
Urobilinogen, Ur: 0.2 mg/dL (ref 0.2–1.0)
pH, UA: 5.5 (ref 5.0–7.5)

## 2021-10-21 LAB — MICROSCOPIC EXAMINATION

## 2021-10-21 LAB — BLADDER SCAN AMB NON-IMAGING: Scan Result: 0

## 2021-10-21 MED ORDER — KETOCONAZOLE 2 % EX CREA: 1.0000 "application " | TOPICAL_CREAM | Freq: Two times a day (BID) | CUTANEOUS | 0 refills | Status: AC

## 2021-10-21 NOTE — Patient Instructions (Addendum)
Please contact Dr. Darrick Huntsman to report your increased leg and scrotal swelling after decreasing your furosemide dose.  Use the prescribed ketoconazole cream twice daily on your right hip crease to treat your rash.  This should clear up within 1 to 2 weeks.  Swelling in the scrotum may take some time to resolve, as the scrotum is located below your torso and fluid has to work against gravity to leave this tissue. To promote resolution of swelling and decrease discomfort, please follow the scrotal support instructions below.  Scrotal support instructions:  Wear compressive underwear, e.g. jockstrap or snug boxer briefs, to keep the scrotum supported and promote drainage of swelling. Elevate the scrotum when at rest. Tuck a rolled washcloth or towel underneath the scrotum to lift it up and promote drainage back into your abdomen.
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
## 2021-10-24 ENCOUNTER — Other Ambulatory Visit: Payer: Self-pay

## 2021-10-24 ENCOUNTER — Emergency Department: Payer: Medicare HMO

## 2021-10-24 ENCOUNTER — Emergency Department
Admission: EM | Admit: 2021-10-24 | Discharge: 2021-10-24 | Disposition: A | Discharge status: Home / Self Care | Payer: Medicare HMO | Attending: Emergency Medicine | Admitting: Emergency Medicine

## 2021-10-24 DIAGNOSIS — Z5321 Procedure and treatment not carried out due to patient leaving prior to being seen by health care provider: Secondary | ICD-10-CM | POA: Insufficient documentation

## 2021-10-24 DIAGNOSIS — N492 Inflammatory disorders of scrotum: Secondary | ICD-10-CM | POA: Diagnosis not present

## 2021-10-24 DIAGNOSIS — I4819 Other persistent atrial fibrillation: Secondary | ICD-10-CM | POA: Diagnosis not present

## 2021-10-24 DIAGNOSIS — I5030 Unspecified diastolic (congestive) heart failure: Secondary | ICD-10-CM | POA: Diagnosis not present

## 2021-10-24 DIAGNOSIS — N5082 Scrotal pain: Secondary | ICD-10-CM | POA: Insufficient documentation

## 2021-10-24 DIAGNOSIS — R2243 Localized swelling, mass and lump, lower limb, bilateral: Secondary | ICD-10-CM | POA: Diagnosis not present

## 2021-10-24 DIAGNOSIS — J431 Panlobular emphysema: Secondary | ICD-10-CM | POA: Diagnosis not present

## 2021-10-24 DIAGNOSIS — I10 Essential (primary) hypertension: Secondary | ICD-10-CM

## 2021-10-24 DIAGNOSIS — N5089 Other specified disorders of the male genital organs: Secondary | ICD-10-CM

## 2021-10-24 DIAGNOSIS — E782 Mixed hyperlipidemia: Secondary | ICD-10-CM

## 2021-10-24 DIAGNOSIS — R600 Localized edema: Secondary | ICD-10-CM | POA: Insufficient documentation

## 2021-10-24 DIAGNOSIS — I25118 Atherosclerotic heart disease of native coronary artery with other forms of angina pectoris: Secondary | ICD-10-CM | POA: Diagnosis not present

## 2021-10-24 DIAGNOSIS — N433 Hydrocele, unspecified: Secondary | ICD-10-CM | POA: Diagnosis not present

## 2021-10-24 LAB — COMPREHENSIVE METABOLIC PANEL
ALT: 10 U/L (ref 0–44)
AST: 18 U/L (ref 15–41)
Albumin: 3.2 g/dL — ABNORMAL LOW (ref 3.5–5.0)
Alkaline Phosphatase: 101 U/L (ref 38–126)
Anion gap: 9 (ref 5–15)
BUN: 31 mg/dL — ABNORMAL HIGH (ref 8–23)
CO2: 33 mmol/L — ABNORMAL HIGH (ref 22–32)
Calcium: 9.3 mg/dL (ref 8.9–10.3)
Chloride: 100 mmol/L (ref 98–111)
Creatinine, Ser: 1.62 mg/dL — ABNORMAL HIGH (ref 0.61–1.24)
GFR, Estimated: 44 mL/min — ABNORMAL LOW (ref >60)
Glucose, Bld: 128 mg/dL — ABNORMAL HIGH (ref 70–99)
Potassium: 3.6 mmol/L (ref 3.5–5.1)
Sodium: 142 mmol/L (ref 135–145)
Total Bilirubin: 1 mg/dL (ref 0.3–1.2)
Total Protein: 8.3 g/dL — ABNORMAL HIGH (ref 6.5–8.1)

## 2021-10-24 LAB — CBC WITH DIFFERENTIAL/PLATELET
Abs Immature Granulocytes: 0.03 10*3/uL (ref 0.00–0.07)
Basophils Absolute: 0.1 10*3/uL (ref 0.0–0.1)
Basophils Relative: 1 %
Eosinophils Absolute: 0.3 10*3/uL (ref 0.0–0.5)
Eosinophils Relative: 4 %
HCT: 34.4 % — ABNORMAL LOW (ref 39.0–52.0)
Hemoglobin: 10.5 g/dL — ABNORMAL LOW (ref 13.0–17.0)
Immature Granulocytes: 0 %
Lymphocytes Relative: 20 %
Lymphs Abs: 1.7 10*3/uL (ref 0.7–4.0)
MCH: 26.3 pg (ref 26.0–34.0)
MCHC: 30.5 g/dL (ref 30.0–36.0)
MCV: 86 fL (ref 80.0–100.0)
Monocytes Absolute: 0.8 10*3/uL (ref 0.1–1.0)
Monocytes Relative: 10 %
Neutro Abs: 5.5 10*3/uL (ref 1.7–7.7)
Neutrophils Relative %: 65 %
Platelets: 201 10*3/uL (ref 150–400)
RBC: 4 MIL/uL — ABNORMAL LOW (ref 4.22–5.81)
RDW: 17.5 % — ABNORMAL HIGH (ref 11.5–15.5)
WBC: 8.4 10*3/uL (ref 4.0–10.5)
nRBC: 0 % (ref 0.0–0.2)

## 2021-10-24 LAB — BRAIN NATRIURETIC PEPTIDE: B Natriuretic Peptide: 219.9 pg/mL — ABNORMAL HIGH (ref 0.0–100.0)

## 2021-10-24 NOTE — Telephone Encounter (Signed)
Pt is currently at the ED

## 2021-10-24 NOTE — ED Provider Notes (Signed)
Emergency Medicine Provider Triage Evaluation Note  Victor Daniels , a 75 y.o. male  was evaluated in triage.  Pt complains of scrotal pain, scrotal edema.  Patient is also reports increased bilateral lower extremity edema and pain to both feet.  Edema in his lower extremities are chronic but he states that is slightly worse than normal.  No chest pain, shortness of breath, abdominal pain.  He has no dysuria, polyuria, hematuria.  No GI complaints..  Review of Systems  Positive: Scrotal pain, scrotal edema, increased lower extremity edema Negative: Fevers, chills, URI symptoms, chest pain, shortness of breath, abdominal pain, nausea, vomiting, diarrhea.  No dysuria, polyuria, hematuria.  Physical Exam  BP (!) 178/88   Pulse 84   Temp 98.5 F (36.9 C) (Oral)   Resp 18   Ht 6' (1.829 m)   Wt 109 kg   SpO2 96%   BMI 32.59 kg/m  Gen:   Awake, no distress   Resp:  Normal effort  MSK:   Moves extremities without difficulty.  Grossly edematous bilateral lower extremities. Other:  Bowel sounds.  No abdominal tenderness  Medical Decision Making  Medically screening exam initiated at 2:27 PM.  Appropriate orders placed.  Victor Daniels was informed that the remainder of the evaluation will be completed by another provider, this initial triage assessment does not replace that evaluation, and the importance of remaining in the ED until their evaluation is complete.  Patient presents with scrotal pain, scrotal edema, bilateral lower extremity edema.  At this time will order labs, ultrasound of the scrotum, urinalysis   Alm Bustard Delorise Royals, PA-C 10/24/21 1428    Shaune Pollack, MD 10/25/21 1459

## 2021-10-24 NOTE — ED Triage Notes (Signed)
Pt to ED for swelling and pain to scrotum for 10 days. States when he urinates he urinates all over himself.   Also c/o bilateral leg swelling for several months

## 2021-10-24 NOTE — Telephone Encounter (Signed)
Patient sent to access nurse . The patient was seen by urology for scrotal swelling . The provider told the patient to call his primary to discuss. I sent the patient to access nurse because it is worsening.

## 2021-10-24 NOTE — Telephone Encounter (Signed)
LMTCB to see if pt went to ED.

## 2021-10-24 NOTE — Assessment & Plan Note (Signed)
Compression wraps ordered through home health RN and increse in furosemide dose to 40 mg daily authorized oct 31.  Repeat bmet needed Nov 3

## 2021-10-25 NOTE — Telephone Encounter (Signed)
LMTCB with both Peggy and pt.

## 2021-10-25 NOTE — Telephone Encounter (Signed)
Patient calling back stating nothing was done for him at the ED . Would like a call back from the assistant. He is still having pain in his scrotum and his leg are weeping. Peggy stated she wants [place] to return a call back once Dr Darrick Huntsman gets a date for an appointment. Patient was seen by urologist 10-21-21 and was seen in the ED yesterday. He has also tried to call the nurses to come wrap his legs ut has not receive a return call back. He would like referral/help in that regard. Peggy [phone].

## 2021-10-27 ENCOUNTER — Other Ambulatory Visit: Payer: Self-pay

## 2021-10-27 ENCOUNTER — Encounter: Payer: Self-pay | Admitting: Internal Medicine

## 2021-10-27 ENCOUNTER — Ambulatory Visit (INDEPENDENT_AMBULATORY_CARE_PROVIDER_SITE_OTHER): Payer: Medicare HMO | Admitting: Internal Medicine

## 2021-10-27 VITALS — BP 160/88 | HR 45

## 2021-10-27 DIAGNOSIS — R601 Generalized edema: Secondary | ICD-10-CM

## 2021-10-27 DIAGNOSIS — I872 Venous insufficiency (chronic) (peripheral): Secondary | ICD-10-CM

## 2021-10-27 DIAGNOSIS — L97822 Non-pressure chronic ulcer of other part of left lower leg with fat layer exposed: Secondary | ICD-10-CM

## 2021-10-27 DIAGNOSIS — N5089 Other specified disorders of the male genital organs: Secondary | ICD-10-CM

## 2021-10-27 MED ORDER — METOLAZONE 2.5 MG PO TABS: 2.5000 mg | ORAL_TABLET | Freq: Every day | ORAL | 0 refills | Status: AC

## 2021-10-27 MED ORDER — POTASSIUM CHLORIDE CRYS ER 20 MEQ PO TBCR: 20.0000 meq | EXTENDED_RELEASE_TABLET | Freq: Every day | ORAL | 3 refills | Status: AC

## 2021-10-27 MED ORDER — TORSEMIDE 20 MG PO TABS: 20.0000 mg | ORAL_TABLET | Freq: Two times a day (BID) | ORAL | 0 refills | Status: AC

## 2021-10-27 NOTE — Telephone Encounter (Signed)
Pt is scheduled with Dr. Darrick Huntsman today at 11:30.

## 2021-10-27 NOTE — Assessment & Plan Note (Signed)
Secondary to reduced furosemide dose due to drop in GFR.  Change regimen  to metolazone/torsemide bid , adding potassium daily.  Home health RN and Wound care referrals made

## 2021-10-27 NOTE — Progress Notes (Signed)
Subjective:  Patient ID: Victor Daniels, male    DOB: 10/31/1946  Age: 75 y.o. MRN: [id number]  CC: The primary encounter diagnosis was Venous stasis ulcer of other part of left lower leg with fat layer exposed without varicose veins (HCC). Diagnoses of Anasarca and Scrotal edema were also pertinent to this visit.  HPI Victor Daniels presents for  Chief Complaint  Patient presents with   Leg Swelling   Groin Swelling    This visit occurred during the SARS-CoV-2 public health emergency.  Safety protocols were in place, including screening questions prior to the visit, additional usage of staff PPE, and extensive cleaning of exam room while observing appropriate contact time as indicated for disinfecting solutions.    1) Peripheral edema:  Ed has had a 23 lb weight gain since June.  Furosemide dose was reduced from 40 mg to 20 mg on October 10 due to drop in GFR.  He started gaining fluid and  Increased his dose to 40 mg daily 10 days ago  with no considerable difference  IN his urinary output and legs continued increase in girth  He went to the ER on Oct 31 after seeing urology on Oct 26 for scrotal swelling candidiasis due to buried penis . He was not admitted because he did not repeat shortness of breath and his BNP was < 300  He no longer has a home health RN so his legs have not been wrapped.  He has several open wounds on his lower extremities  Outpatient Medications Prior to Visit  Medication Sig Dispense Refill   albuterol (PROVENTIL) (2.5 MG/3ML) 0.083% nebulizer solution Take 3 mLs (2.5 mg total) by nebulization every 6 (six) hours as needed for wheezing or shortness of breath. 75 mL 0   Fluticasone-Umeclidin-Vilant (TRELEGY ELLIPTA) 100-62.5-25 MCG/ACT AEPB Inhale 1 puff into the lungs daily. 2 each 0   ipratropium-albuterol (DUONEB) 0.5-2.5 (3) MG/3ML SOLN USE 1 VIAL IN NEBULIZER EVERY 6 HOURS AS NEEDED FOR WHEEZING 360 mL 0   ketoconazole (NIZORAL) 2 % cream Apply  1 application topically 2 (two) times daily. 30 g 0   losartan (COZAAR) 25 MG tablet Take 1 tablet (25 mg total) by mouth daily. 90 tablet 1   mupirocin ointment (BACTROBAN) 2 % Apply 1 application topically 2 (two) times daily. 30 g 2   nitroGLYCERIN (NITROSTAT) 0.4 MG SL tablet Place 1 tablet (0.4 mg total) under the tongue every 5 (five) minutes as needed for chest pain. Maximum dose 3 tablets 50 tablet 3   oxyCODONE (ROXICODONE) 15 MG immediate release tablet Take 1 tablet (15 mg total) by mouth every 6 (six) hours as needed. 120 tablet 0   OXYGEN Inhale 2 L into the lungs 3 (three) times daily as needed (shortness of breath).      rivaroxaban (XARELTO) 20 MG TABS tablet Take 1 tablet (20 mg total) by mouth daily with supper. 90 tablet 3   simvastatin (ZOCOR) 20 MG tablet TAKE 1 TABLET (20 MG TOTAL) BY MOUTH AT BEDTIME. 90 tablet 1   spironolactone (ALDACTONE) 50 MG tablet Take 1 tablet (50 mg total) by mouth daily. In am 90 tablet 1   VENTOLIN HFA 108 (90 Base) MCG/ACT inhaler INHALE 2 PUFFS BY MOUTH EVERY 6 HOURS AS NEEDED FOR WHEEZING FOR SHORTNESS OF BREATH 18 g 0   vitamin B-12 (CYANOCOBALAMIN) 1000 MCG tablet Take 1,000 mcg by mouth daily.     furosemide (LASIX) 20 MG tablet Take 1 tablet (20  mg total) by mouth daily. D/c 40 mg 30 tablet 2   No facility-administered medications prior to visit.    Review of Systems;  Patient denies headache, fevers, malaise, unintentional weight loss, skin rash, eye pain, sinus congestion and sinus pain, sore throat, dysphagia,  hemoptysis , cough, dyspnea, wheezing, chest pain, palpitations, orthopnea, edema, abdominal pain, nausea, melena, diarrhea, constipation, flank pain, dysuria, hematuria, urinary  Frequency, nocturia, numbness, tingling, seizures,  Focal weakness, Loss of consciousness,  Tremor, insomnia, depression, anxiety, and suicidal ideation.      Objective:  BP (!) 160/88   Pulse (!) 45   SpO2 94%   BP Readings from Last 3  Encounters:  10/27/21 (!) 160/88  10/24/21 (!) 178/88  10/21/21 (!) 149/72    Wt Readings from Last 3 Encounters:  10/24/21 240 lb 4.8 oz (109 kg)  10/21/21 240 lb (108.9 kg)  07/27/21 227 lb (103 kg)    General appearance: alert, cooperative and appears stated age Ears: normal TM's and external ear canals both ears Throat: lips, mucosa, and tongue normal; teeth and gums normal Neck: no adenopathy, no carotid bruit, supple, symmetrical, trachea midline and thyroid not enlarged, symmetric, no tenderness/mass/nodules Back: symmetric, no curvature. ROM normal. No CVA tenderness. Lungs: clear to auscultation bilaterally Heart: regular rate and rhythm, S1, S2 normal, no murmur, click, rub or gallop Abdomen: soft, non-tender; bowel sounds normal; no masses,  no organomegaly Pulses: 2+ and symmetric Skin: Skin color, texture, turgor normal. No rashes or lesions Lymph nodes: Cervical, supraclavicular, and axillary nodes normal. Scrotum:  diffusely swollen,  penis not visible due to swelling Ext:  venous stasis changes,  3+ pitting edema  to mid tibia .  Left lateral wound oozing,  no signs of infection   Lab Results  Component Value Date   HGBA1C 6.1 (H) 02/11/2021   HGBA1C 6.3 (H) 06/23/2020   HGBA1C 6.6 (H) 03/15/2020    Lab Results  Component Value Date   CREATININE 1.62 (H) 10/24/2021   CREATININE 1.75 (H) 09/22/2021   CREATININE 1.42 08/17/2021    Lab Results  Component Value Date   WBC 8.4 10/24/2021   HGB 10.5 (L) 10/24/2021   HCT 34.4 (L) 10/24/2021   PLT 201 10/24/2021   GLUCOSE 128 (H) 10/24/2021   CHOL 103 02/11/2021   TRIG 54 02/11/2021   HDL 37 (L) 02/11/2021   LDLDIRECT 72.0 12/01/2016   LDLCALC 53 02/11/2021   ALT 10 10/24/2021   AST 18 10/24/2021   NA 142 10/24/2021   K 3.6 10/24/2021   CL 100 10/24/2021   CREATININE 1.62 (H) 10/24/2021   BUN 31 (H) 10/24/2021   CO2 33 (H) 10/24/2021   TSH 2.16 07/29/2021   PSA 0.29 02/11/2021   INR 1.3 (H)  07/23/2020   HGBA1C 6.1 (H) 02/11/2021   MICROALBUR 0.5 02/11/2021    US SCROTUM W/DOPPLER  Result Date: 10/24/2021 CLINICAL DATA:  Scrotal pain and swelling for 10 days. EXAM: SCROTAL ULTRASOUND DOPPLER ULTRASOUND OF THE TESTICLES TECHNIQUE: Complete ultrasound examination of the testicles, epididymis, and other scrotal structures was performed. Color and spectral Doppler ultrasound were also utilized to evaluate blood flow to the testicles. COMPARISON:  None. FINDINGS: Right testicle Measurements: 2.7 x 2.9 x 2.9 cm. No mass or microlithiasis visualized. Left testicle Measurements: 3.4 x 2.7 x 2.4 cm. No mass or microlithiasis visualized. Right epididymis:  Normal in size and appearance. Left epididymis:  Normal in size and appearance. Hydrocele:  Small to moderate hydroceles are seen bilaterally.  Varicocele:  None visualized. Pulsed Doppler interrogation of both testes demonstrates normal low resistance arterial and venous waveforms bilaterally. Other: Marked diffuse scrotal wall thickening is seen bilaterally. No significant increased blood flow is seen within the scrotal wall on color Doppler ultrasound. This is suggestive of diffuse scrotal wall edema, although cellulitis cannot definitely be excluded. IMPRESSION: No evidence of testicular mass or torsion. Small to moderate bilateral hydroceles. Marked diffuse scrotal wall thickening, which may be due to soft tissue edema although cellulitis cannot definitely be excluded. Correlation with physical exam findings is recommended. Electronically Signed   By: Marlaine Hind M.D.   On: 10/24/2021 16:07    Assessment & Plan:   Problem List Items Addressed This Visit     Anasarca    Secondary to reduced furosemide dose due to drop in GFR.  Change regimen  to metolazone/torsemide bid , adding potassium daily.  Home health RN and Wound care referrals made       Other Visit Diagnoses     Venous stasis ulcer of other part of left lower leg with fat  layer exposed without varicose veins ([place])    -  Primary   Relevant Orders   Ambulatory referral to Pain Clinic   Ambulatory referral to Home Health   Scrotal edema       Relevant Orders   Ambulatory referral to Home Health       Follow-up: No follow-ups on file.   Crecencio Mc, MD

## 2021-10-27 NOTE — Patient Instructions (Signed)
I have changed your furosemide to  A REGIMEN CONTAINING 2 OTHER MEDICINES ,  METOLAZONE AND  TORSEMIDE    STOP THE FUROSEMIDE FOR NOW   TAKE THE METOLAZONE 30 MINUTES BEFORE THE TORSEMIDE.  VERY IMPORTANT SEQUENCE  DO THIS TWICE DAILY  TAKE ONE POTASSIUM PILL DAILY   I WILL MAKE ANOTHER REFERRAL TO HOME HEALTH AND TO THE WOUND CARE CENTER TO MANAGED YOUR WOUNDS AND WRAP YOUR LEGS

## 2021-11-03 ENCOUNTER — Telehealth: Payer: Self-pay | Admitting: Internal Medicine

## 2021-11-03 ENCOUNTER — Telehealth: Payer: Medicare HMO | Admitting: Internal Medicine

## 2021-11-03 MED ORDER — OXYCODONE HCL 15 MG PO TABS: 15.0000 mg | ORAL_TABLET | Freq: Four times a day (QID) | ORAL | 0 refills | Status: AC | PRN

## 2021-11-03 NOTE — Telephone Encounter (Signed)
Pt called in requesting refill on medication (oxyCODONE (ROXICODONE) 15 MG immediate release tablet). Pt stated that he needs medication by today. Pt would like callback.

## 2021-11-03 NOTE — Telephone Encounter (Signed)
Patient called and is requesting refill on his  oxyCODONE (ROXICODONE) 15 MG immediate release tablet. He wold like it sent to  [facility] on [address]

## 2021-11-04 ENCOUNTER — Ambulatory Visit: Payer: Medicare HMO | Admitting: Internal Medicine

## 2021-11-04 NOTE — Telephone Encounter (Signed)
According to chart medication has been ordered

## 2021-11-04 NOTE — Telephone Encounter (Signed)
I called patient to let him know the medication has been sent. He said that he did not make appointment due to diarrhea yesterday. He had also changed to phone call? Pt is rescheduled to see you next Friday at 2:30p.

## 2021-11-06 ENCOUNTER — Emergency Department
Admission: EM | Admit: 2021-11-06 | Discharge: 2021-11-06 | Disposition: A | Discharge status: Home / Self Care | Payer: Medicare HMO | Source: Home / Self Care | Attending: Emergency Medicine | Admitting: Emergency Medicine

## 2021-11-06 ENCOUNTER — Other Ambulatory Visit: Payer: Self-pay

## 2021-11-06 DIAGNOSIS — R6520 Severe sepsis without septic shock: Secondary | ICD-10-CM | POA: Diagnosis not present

## 2021-11-06 DIAGNOSIS — E871 Hypo-osmolality and hyponatremia: Secondary | ICD-10-CM | POA: Diagnosis not present

## 2021-11-06 DIAGNOSIS — I130 Hypertensive heart and chronic kidney disease with heart failure and stage 1 through stage 4 chronic kidney disease, or unspecified chronic kidney disease: Secondary | ICD-10-CM | POA: Insufficient documentation

## 2021-11-06 DIAGNOSIS — L0291 Cutaneous abscess, unspecified: Secondary | ICD-10-CM | POA: Diagnosis not present

## 2021-11-06 DIAGNOSIS — F1020 Alcohol dependence, uncomplicated: Secondary | ICD-10-CM | POA: Diagnosis present

## 2021-11-06 DIAGNOSIS — R609 Edema, unspecified: Secondary | ICD-10-CM | POA: Diagnosis not present

## 2021-11-06 DIAGNOSIS — M60009 Infective myositis, unspecified site: Secondary | ICD-10-CM | POA: Diagnosis not present

## 2021-11-06 DIAGNOSIS — J9610 Chronic respiratory failure, unspecified whether with hypoxia or hypercapnia: Secondary | ICD-10-CM | POA: Diagnosis present

## 2021-11-06 DIAGNOSIS — I4891 Unspecified atrial fibrillation: Secondary | ICD-10-CM | POA: Diagnosis not present

## 2021-11-06 DIAGNOSIS — I509 Heart failure, unspecified: Secondary | ICD-10-CM

## 2021-11-06 DIAGNOSIS — Z96649 Presence of unspecified artificial hip joint: Secondary | ICD-10-CM | POA: Insufficient documentation

## 2021-11-06 DIAGNOSIS — I340 Nonrheumatic mitral (valve) insufficiency: Secondary | ICD-10-CM | POA: Diagnosis not present

## 2021-11-06 DIAGNOSIS — Z66 Do not resuscitate: Secondary | ICD-10-CM | POA: Diagnosis not present

## 2021-11-06 DIAGNOSIS — J9611 Chronic respiratory failure with hypoxia: Secondary | ICD-10-CM

## 2021-11-06 DIAGNOSIS — I371 Nonrheumatic pulmonary valve insufficiency: Secondary | ICD-10-CM | POA: Diagnosis not present

## 2021-11-06 DIAGNOSIS — R079 Chest pain, unspecified: Secondary | ICD-10-CM | POA: Diagnosis not present

## 2021-11-06 DIAGNOSIS — M009 Pyogenic arthritis, unspecified: Secondary | ICD-10-CM | POA: Diagnosis not present

## 2021-11-06 DIAGNOSIS — Z79899 Other long term (current) drug therapy: Secondary | ICD-10-CM | POA: Insufficient documentation

## 2021-11-06 DIAGNOSIS — M00012 Staphylococcal arthritis, left shoulder: Secondary | ICD-10-CM | POA: Diagnosis present

## 2021-11-06 DIAGNOSIS — J431 Panlobular emphysema: Secondary | ICD-10-CM | POA: Diagnosis not present

## 2021-11-06 DIAGNOSIS — X58XXXA Exposure to other specified factors, initial encounter: Secondary | ICD-10-CM | POA: Insufficient documentation

## 2021-11-06 DIAGNOSIS — R531 Weakness: Secondary | ICD-10-CM | POA: Diagnosis not present

## 2021-11-06 DIAGNOSIS — R410 Disorientation, unspecified: Secondary | ICD-10-CM | POA: Diagnosis not present

## 2021-11-06 DIAGNOSIS — L03116 Cellulitis of left lower limb: Secondary | ICD-10-CM | POA: Diagnosis not present

## 2021-11-06 DIAGNOSIS — M25411 Effusion, right shoulder: Secondary | ICD-10-CM | POA: Diagnosis not present

## 2021-11-06 DIAGNOSIS — M542 Cervicalgia: Secondary | ICD-10-CM | POA: Diagnosis not present

## 2021-11-06 DIAGNOSIS — J449 Chronic obstructive pulmonary disease, unspecified: Secondary | ICD-10-CM | POA: Insufficient documentation

## 2021-11-06 DIAGNOSIS — M6008 Infective myositis, other site: Secondary | ICD-10-CM | POA: Diagnosis present

## 2021-11-06 DIAGNOSIS — E1151 Type 2 diabetes mellitus with diabetic peripheral angiopathy without gangrene: Secondary | ICD-10-CM | POA: Diagnosis present

## 2021-11-06 DIAGNOSIS — N183 Chronic kidney disease, stage 3 unspecified: Secondary | ICD-10-CM | POA: Diagnosis not present

## 2021-11-06 DIAGNOSIS — E870 Hyperosmolality and hypernatremia: Secondary | ICD-10-CM | POA: Diagnosis not present

## 2021-11-06 DIAGNOSIS — S81802A Unspecified open wound, left lower leg, initial encounter: Secondary | ICD-10-CM | POA: Insufficient documentation

## 2021-11-06 DIAGNOSIS — I76 Septic arterial embolism: Secondary | ICD-10-CM | POA: Diagnosis present

## 2021-11-06 DIAGNOSIS — G9349 Other encephalopathy: Secondary | ICD-10-CM | POA: Diagnosis present

## 2021-11-06 DIAGNOSIS — I517 Cardiomegaly: Secondary | ICD-10-CM | POA: Diagnosis not present

## 2021-11-06 DIAGNOSIS — I700 Atherosclerosis of aorta: Secondary | ICD-10-CM | POA: Diagnosis not present

## 2021-11-06 DIAGNOSIS — N179 Acute kidney failure, unspecified: Secondary | ICD-10-CM | POA: Insufficient documentation

## 2021-11-06 DIAGNOSIS — Z87891 Personal history of nicotine dependence: Secondary | ICD-10-CM | POA: Insufficient documentation

## 2021-11-06 DIAGNOSIS — L02213 Cutaneous abscess of chest wall: Secondary | ICD-10-CM | POA: Diagnosis present

## 2021-11-06 DIAGNOSIS — I10 Essential (primary) hypertension: Secondary | ICD-10-CM | POA: Diagnosis not present

## 2021-11-06 DIAGNOSIS — M19011 Primary osteoarthritis, right shoulder: Secondary | ICD-10-CM | POA: Diagnosis not present

## 2021-11-06 DIAGNOSIS — E669 Obesity, unspecified: Secondary | ICD-10-CM | POA: Diagnosis present

## 2021-11-06 DIAGNOSIS — R55 Syncope and collapse: Secondary | ICD-10-CM | POA: Insufficient documentation

## 2021-11-06 DIAGNOSIS — Z6835 Body mass index (BMI) 35.0-35.9, adult: Secondary | ICD-10-CM | POA: Diagnosis not present

## 2021-11-06 DIAGNOSIS — Z515 Encounter for palliative care: Secondary | ICD-10-CM | POA: Diagnosis not present

## 2021-11-06 DIAGNOSIS — I25118 Atherosclerotic heart disease of native coronary artery with other forms of angina pectoris: Secondary | ICD-10-CM | POA: Diagnosis not present

## 2021-11-06 DIAGNOSIS — R918 Other nonspecific abnormal finding of lung field: Secondary | ICD-10-CM | POA: Diagnosis not present

## 2021-11-06 DIAGNOSIS — I5032 Chronic diastolic (congestive) heart failure: Secondary | ICD-10-CM | POA: Diagnosis present

## 2021-11-06 DIAGNOSIS — E1159 Type 2 diabetes mellitus with other circulatory complications: Secondary | ICD-10-CM | POA: Insufficient documentation

## 2021-11-06 DIAGNOSIS — R4182 Altered mental status, unspecified: Secondary | ICD-10-CM | POA: Diagnosis not present

## 2021-11-06 DIAGNOSIS — B9562 Methicillin resistant Staphylococcus aureus infection as the cause of diseases classified elsewhere: Secondary | ICD-10-CM | POA: Diagnosis not present

## 2021-11-06 DIAGNOSIS — J189 Pneumonia, unspecified organism: Secondary | ICD-10-CM | POA: Diagnosis not present

## 2021-11-06 DIAGNOSIS — G9341 Metabolic encephalopathy: Secondary | ICD-10-CM | POA: Diagnosis present

## 2021-11-06 DIAGNOSIS — R7881 Bacteremia: Secondary | ICD-10-CM | POA: Diagnosis not present

## 2021-11-06 DIAGNOSIS — I69311 Memory deficit following cerebral infarction: Secondary | ICD-10-CM | POA: Diagnosis not present

## 2021-11-06 DIAGNOSIS — Z20822 Contact with and (suspected) exposure to covid-19: Secondary | ICD-10-CM | POA: Diagnosis present

## 2021-11-06 DIAGNOSIS — M545 Low back pain, unspecified: Secondary | ICD-10-CM | POA: Diagnosis not present

## 2021-11-06 DIAGNOSIS — R911 Solitary pulmonary nodule: Secondary | ICD-10-CM | POA: Diagnosis not present

## 2021-11-06 DIAGNOSIS — N1832 Chronic kidney disease, stage 3b: Secondary | ICD-10-CM | POA: Diagnosis present

## 2021-11-06 DIAGNOSIS — I361 Nonrheumatic tricuspid (valve) insufficiency: Secondary | ICD-10-CM | POA: Diagnosis not present

## 2021-11-06 DIAGNOSIS — M546 Pain in thoracic spine: Secondary | ICD-10-CM | POA: Diagnosis not present

## 2021-11-06 DIAGNOSIS — I2510 Atherosclerotic heart disease of native coronary artery without angina pectoris: Secondary | ICD-10-CM | POA: Insufficient documentation

## 2021-11-06 DIAGNOSIS — I890 Lymphedema, not elsewhere classified: Secondary | ICD-10-CM | POA: Diagnosis not present

## 2021-11-06 DIAGNOSIS — L02211 Cutaneous abscess of abdominal wall: Secondary | ICD-10-CM | POA: Diagnosis not present

## 2021-11-06 DIAGNOSIS — A419 Sepsis, unspecified organism: Secondary | ICD-10-CM | POA: Diagnosis not present

## 2021-11-06 DIAGNOSIS — J15212 Pneumonia due to Methicillin resistant Staphylococcus aureus: Secondary | ICD-10-CM | POA: Diagnosis present

## 2021-11-06 DIAGNOSIS — Z7189 Other specified counseling: Secondary | ICD-10-CM | POA: Diagnosis not present

## 2021-11-06 DIAGNOSIS — R52 Pain, unspecified: Secondary | ICD-10-CM | POA: Diagnosis not present

## 2021-11-06 DIAGNOSIS — J440 Chronic obstructive pulmonary disease with acute lower respiratory infection: Secondary | ICD-10-CM | POA: Diagnosis present

## 2021-11-06 DIAGNOSIS — N1831 Chronic kidney disease, stage 3a: Secondary | ICD-10-CM | POA: Insufficient documentation

## 2021-11-06 DIAGNOSIS — E1122 Type 2 diabetes mellitus with diabetic chronic kidney disease: Secondary | ICD-10-CM | POA: Diagnosis present

## 2021-11-06 DIAGNOSIS — A4102 Sepsis due to Methicillin resistant Staphylococcus aureus: Secondary | ICD-10-CM | POA: Diagnosis present

## 2021-11-06 DIAGNOSIS — W19XXXA Unspecified fall, initial encounter: Secondary | ICD-10-CM | POA: Diagnosis not present

## 2021-11-06 LAB — CBC
HCT: 30.4 % — ABNORMAL LOW (ref 39.0–52.0)
Hemoglobin: 9.4 g/dL — ABNORMAL LOW (ref 13.0–17.0)
MCH: 24.9 pg — ABNORMAL LOW (ref 26.0–34.0)
MCHC: 30.9 g/dL (ref 30.0–36.0)
MCV: 80.6 fL (ref 80.0–100.0)
Platelets: 232 10*3/uL (ref 150–400)
RBC: 3.77 MIL/uL — ABNORMAL LOW (ref 4.22–5.81)
RDW: 16.9 % — ABNORMAL HIGH (ref 11.5–15.5)
WBC: 20.5 10*3/uL — ABNORMAL HIGH (ref 4.0–10.5)
nRBC: 0 % (ref 0.0–0.2)

## 2021-11-06 LAB — COMPREHENSIVE METABOLIC PANEL
ALT: 18 U/L (ref 0–44)
AST: 30 U/L (ref 15–41)
Albumin: 2.3 g/dL — ABNORMAL LOW (ref 3.5–5.0)
Alkaline Phosphatase: 124 U/L (ref 38–126)
Anion gap: 10 (ref 5–15)
BUN: 96 mg/dL — ABNORMAL HIGH (ref 8–23)
CO2: 30 mmol/L (ref 22–32)
Calcium: 8.4 mg/dL — ABNORMAL LOW (ref 8.9–10.3)
Chloride: 93 mmol/L — ABNORMAL LOW (ref 98–111)
Creatinine, Ser: 4.2 mg/dL — ABNORMAL HIGH (ref 0.61–1.24)
GFR, Estimated: 14 mL/min — ABNORMAL LOW (ref >60)
Glucose, Bld: 159 mg/dL — ABNORMAL HIGH (ref 70–99)
Potassium: 4 mmol/L (ref 3.5–5.1)
Sodium: 133 mmol/L — ABNORMAL LOW (ref 135–145)
Total Bilirubin: 1.7 mg/dL — ABNORMAL HIGH (ref 0.3–1.2)
Total Protein: 7.3 g/dL (ref 6.5–8.1)

## 2021-11-06 MED ORDER — ONDANSETRON 4 MG PO TBDP: 4.0000 mg | ORAL_TABLET | Freq: Three times a day (TID) | ORAL | 0 refills | Status: AC | PRN

## 2021-11-06 MED ORDER — AMOXICILLIN-POT CLAVULANATE 875-125 MG PO TABS: 1.0000 | ORAL_TABLET | Freq: Two times a day (BID) | ORAL | 0 refills | Status: AC

## 2021-11-06 NOTE — ED Triage Notes (Signed)
Pt uses 2-3 liters [place] chronically. Pt alert, oriented to self, situation, not oriented to year/date. PT states "I was out in the yard, I lost my memory and I'm trying to get it back." NAD noted at this time.
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
## 2021-11-06 NOTE — Discharge Instructions (Signed)
We are concerned you have an infection that is causing you a severe illness and creating a risk of kidney failure.  If this gets worse, you could die.  Please take the antibiotic Augmentin as prescribed.  Please follow up with Dr. Darrick Huntsman tomorrow.  Return to the ER right away if you change your mind and are willing to be admitted to the hospital, or if you have any new or worsening symptoms.
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
## 2021-11-06 NOTE — ED Triage Notes (Signed)
Pt comes into the Ed via EMS from home , states called out by neighbors, pt was outside naked, recently lost his wife, concerned his health is declining and the pt mental state. VSS

## 2021-11-07 ENCOUNTER — Emergency Department: Payer: Medicare HMO

## 2021-11-07 ENCOUNTER — Telehealth: Payer: Self-pay | Admitting: Internal Medicine

## 2021-11-07 ENCOUNTER — Other Ambulatory Visit: Payer: Self-pay

## 2021-11-07 ENCOUNTER — Telehealth: Payer: Self-pay

## 2021-11-07 DIAGNOSIS — E669 Obesity, unspecified: Secondary | ICD-10-CM | POA: Diagnosis present

## 2021-11-07 DIAGNOSIS — G8929 Other chronic pain: Secondary | ICD-10-CM | POA: Diagnosis present

## 2021-11-07 DIAGNOSIS — E870 Hyperosmolality and hypernatremia: Secondary | ICD-10-CM | POA: Diagnosis not present

## 2021-11-07 DIAGNOSIS — L97821 Non-pressure chronic ulcer of other part of left lower leg limited to breakdown of skin: Secondary | ICD-10-CM | POA: Diagnosis present

## 2021-11-07 DIAGNOSIS — I872 Venous insufficiency (chronic) (peripheral): Secondary | ICD-10-CM | POA: Diagnosis present

## 2021-11-07 DIAGNOSIS — Z8249 Family history of ischemic heart disease and other diseases of the circulatory system: Secondary | ICD-10-CM

## 2021-11-07 DIAGNOSIS — N179 Acute kidney failure, unspecified: Secondary | ICD-10-CM | POA: Diagnosis present

## 2021-11-07 DIAGNOSIS — L02213 Cutaneous abscess of chest wall: Secondary | ICD-10-CM | POA: Diagnosis present

## 2021-11-07 DIAGNOSIS — G4733 Obstructive sleep apnea (adult) (pediatric): Secondary | ICD-10-CM | POA: Diagnosis present

## 2021-11-07 DIAGNOSIS — Z87891 Personal history of nicotine dependence: Secondary | ICD-10-CM

## 2021-11-07 DIAGNOSIS — Z7901 Long term (current) use of anticoagulants: Secondary | ICD-10-CM

## 2021-11-07 DIAGNOSIS — I252 Old myocardial infarction: Secondary | ICD-10-CM

## 2021-11-07 DIAGNOSIS — E1151 Type 2 diabetes mellitus with diabetic peripheral angiopathy without gangrene: Secondary | ICD-10-CM | POA: Diagnosis present

## 2021-11-07 DIAGNOSIS — I83028 Varicose veins of left lower extremity with ulcer other part of lower leg: Secondary | ICD-10-CM | POA: Diagnosis present

## 2021-11-07 DIAGNOSIS — J440 Chronic obstructive pulmonary disease with acute lower respiratory infection: Secondary | ICD-10-CM | POA: Diagnosis present

## 2021-11-07 DIAGNOSIS — Z905 Acquired absence of kidney: Secondary | ICD-10-CM

## 2021-11-07 DIAGNOSIS — E785 Hyperlipidemia, unspecified: Secondary | ICD-10-CM | POA: Diagnosis present

## 2021-11-07 DIAGNOSIS — Z20822 Contact with and (suspected) exposure to covid-19: Secondary | ICD-10-CM | POA: Diagnosis present

## 2021-11-07 DIAGNOSIS — Z6835 Body mass index (BMI) 35.0-35.9, adult: Secondary | ICD-10-CM

## 2021-11-07 DIAGNOSIS — G9349 Other encephalopathy: Secondary | ICD-10-CM | POA: Diagnosis present

## 2021-11-07 DIAGNOSIS — Z515 Encounter for palliative care: Secondary | ICD-10-CM

## 2021-11-07 DIAGNOSIS — R55 Syncope and collapse: Secondary | ICD-10-CM | POA: Diagnosis present

## 2021-11-07 DIAGNOSIS — I69311 Memory deficit following cerebral infarction: Secondary | ICD-10-CM

## 2021-11-07 DIAGNOSIS — Z888 Allergy status to other drugs, medicaments and biological substances status: Secondary | ICD-10-CM

## 2021-11-07 DIAGNOSIS — I255 Ischemic cardiomyopathy: Secondary | ICD-10-CM | POA: Diagnosis present

## 2021-11-07 DIAGNOSIS — F1020 Alcohol dependence, uncomplicated: Secondary | ICD-10-CM | POA: Diagnosis present

## 2021-11-07 DIAGNOSIS — R627 Adult failure to thrive: Secondary | ICD-10-CM | POA: Diagnosis present

## 2021-11-07 DIAGNOSIS — Z7951 Long term (current) use of inhaled steroids: Secondary | ICD-10-CM

## 2021-11-07 DIAGNOSIS — M6008 Infective myositis, other site: Secondary | ICD-10-CM | POA: Diagnosis present

## 2021-11-07 DIAGNOSIS — E1122 Type 2 diabetes mellitus with diabetic chronic kidney disease: Secondary | ICD-10-CM | POA: Diagnosis present

## 2021-11-07 DIAGNOSIS — M542 Cervicalgia: Secondary | ICD-10-CM | POA: Diagnosis present

## 2021-11-07 DIAGNOSIS — J941 Fibrothorax: Secondary | ICD-10-CM | POA: Diagnosis present

## 2021-11-07 DIAGNOSIS — Z79899 Other long term (current) drug therapy: Secondary | ICD-10-CM

## 2021-11-07 DIAGNOSIS — Z9981 Dependence on supplemental oxygen: Secondary | ICD-10-CM

## 2021-11-07 DIAGNOSIS — J9610 Chronic respiratory failure, unspecified whether with hypoxia or hypercapnia: Secondary | ICD-10-CM | POA: Diagnosis present

## 2021-11-07 DIAGNOSIS — Z882 Allergy status to sulfonamides status: Secondary | ICD-10-CM

## 2021-11-07 DIAGNOSIS — I76 Septic arterial embolism: Secondary | ICD-10-CM | POA: Diagnosis present

## 2021-11-07 DIAGNOSIS — I482 Chronic atrial fibrillation, unspecified: Secondary | ICD-10-CM | POA: Diagnosis present

## 2021-11-07 DIAGNOSIS — I130 Hypertensive heart and chronic kidney disease with heart failure and stage 1 through stage 4 chronic kidney disease, or unspecified chronic kidney disease: Secondary | ICD-10-CM | POA: Diagnosis present

## 2021-11-07 DIAGNOSIS — M00012 Staphylococcal arthritis, left shoulder: Secondary | ICD-10-CM | POA: Diagnosis present

## 2021-11-07 DIAGNOSIS — R6520 Severe sepsis without septic shock: Secondary | ICD-10-CM | POA: Diagnosis present

## 2021-11-07 DIAGNOSIS — G9341 Metabolic encephalopathy: Secondary | ICD-10-CM | POA: Diagnosis present

## 2021-11-07 DIAGNOSIS — E871 Hypo-osmolality and hyponatremia: Secondary | ICD-10-CM | POA: Diagnosis not present

## 2021-11-07 DIAGNOSIS — N1832 Chronic kidney disease, stage 3b: Secondary | ICD-10-CM | POA: Diagnosis present

## 2021-11-07 DIAGNOSIS — I2510 Atherosclerotic heart disease of native coronary artery without angina pectoris: Secondary | ICD-10-CM | POA: Diagnosis present

## 2021-11-07 DIAGNOSIS — Z96649 Presence of unspecified artificial hip joint: Secondary | ICD-10-CM | POA: Diagnosis present

## 2021-11-07 DIAGNOSIS — E860 Dehydration: Secondary | ICD-10-CM | POA: Diagnosis present

## 2021-11-07 DIAGNOSIS — J15212 Pneumonia due to Methicillin resistant Staphylococcus aureus: Secondary | ICD-10-CM | POA: Diagnosis present

## 2021-11-07 DIAGNOSIS — L03116 Cellulitis of left lower limb: Secondary | ICD-10-CM | POA: Diagnosis present

## 2021-11-07 DIAGNOSIS — Z66 Do not resuscitate: Secondary | ICD-10-CM | POA: Diagnosis not present

## 2021-11-07 DIAGNOSIS — I5032 Chronic diastolic (congestive) heart failure: Secondary | ICD-10-CM | POA: Diagnosis present

## 2021-11-07 DIAGNOSIS — M79604 Pain in right leg: Secondary | ICD-10-CM | POA: Diagnosis present

## 2021-11-07 DIAGNOSIS — A4102 Sepsis due to Methicillin resistant Staphylococcus aureus: Principal | ICD-10-CM | POA: Diagnosis present

## 2021-11-07 DIAGNOSIS — M549 Dorsalgia, unspecified: Secondary | ICD-10-CM | POA: Diagnosis present

## 2021-11-07 LAB — CBC WITH DIFFERENTIAL/PLATELET
Abs Immature Granulocytes: 0.49 10*3/uL — ABNORMAL HIGH (ref 0.00–0.07)
Basophils Absolute: 0.1 10*3/uL (ref 0.0–0.1)
Basophils Relative: 1 %
Eosinophils Absolute: 0 10*3/uL (ref 0.0–0.5)
Eosinophils Relative: 0 %
HCT: 31.2 % — ABNORMAL LOW (ref 39.0–52.0)
Hemoglobin: 10 g/dL — ABNORMAL LOW (ref 13.0–17.0)
Immature Granulocytes: 3 %
Lymphocytes Relative: 4 %
Lymphs Abs: 0.7 10*3/uL (ref 0.7–4.0)
MCH: 25.5 pg — ABNORMAL LOW (ref 26.0–34.0)
MCHC: 32.1 g/dL (ref 30.0–36.0)
MCV: 79.6 fL — ABNORMAL LOW (ref 80.0–100.0)
Monocytes Absolute: 0.6 10*3/uL (ref 0.1–1.0)
Monocytes Relative: 3 %
Neutro Abs: 16.5 10*3/uL — ABNORMAL HIGH (ref 1.7–7.7)
Neutrophils Relative %: 89 %
Platelets: 199 10*3/uL (ref 150–400)
RBC: 3.92 MIL/uL — ABNORMAL LOW (ref 4.22–5.81)
RDW: 17.6 % — ABNORMAL HIGH (ref 11.5–15.5)
Smear Review: NORMAL
WBC: 18.4 10*3/uL — ABNORMAL HIGH (ref 4.0–10.5)
nRBC: 0 % (ref 0.0–0.2)

## 2021-11-07 LAB — URINALYSIS, ROUTINE W REFLEX MICROSCOPIC
Bacteria, UA: NONE SEEN
Bilirubin Urine: NEGATIVE
Glucose, UA: NEGATIVE mg/dL
Ketones, ur: NEGATIVE mg/dL
Leukocytes,Ua: NEGATIVE
Nitrite: NEGATIVE
Protein, ur: NEGATIVE mg/dL
Specific Gravity, Urine: 1.01 (ref 1.005–1.030)
pH: 5 (ref 5.0–8.0)

## 2021-11-07 LAB — TROPONIN I (HIGH SENSITIVITY): Troponin I (High Sensitivity): 40 ng/L — ABNORMAL HIGH (ref <18)

## 2021-11-07 LAB — URINE DRUG SCREEN, QUALITATIVE (ARMC ONLY)
Amphetamines, Ur Screen: NOT DETECTED
Barbiturates, Ur Screen: NOT DETECTED
Benzodiazepine, Ur Scrn: NOT DETECTED
Cannabinoid 50 Ng, Ur ~~LOC~~: NOT DETECTED
Cocaine Metabolite,Ur ~~LOC~~: NOT DETECTED
MDMA (Ecstasy)Ur Screen: NOT DETECTED
Methadone Scn, Ur: NOT DETECTED
Opiate, Ur Screen: NOT DETECTED
Phencyclidine (PCP) Ur S: NOT DETECTED
Tricyclic, Ur Screen: NOT DETECTED

## 2021-11-07 LAB — LACTIC ACID, PLASMA: Lactic Acid, Venous: 1.5 mmol/L (ref 0.5–1.9)

## 2021-11-07 LAB — PROTIME-INR
INR: 1.2 (ref 0.8–1.2)
Prothrombin Time: 15.4 seconds — ABNORMAL HIGH (ref 11.4–15.2)

## 2021-11-07 MED ORDER — OXYCODONE-ACETAMINOPHEN 5-325 MG PO TABS
1.0000 | ORAL_TABLET | ORAL | Status: AC | PRN
  Administered 2021-11-07 – 2021-11-08 (×2): 1 via ORAL
  Filled 2021-11-07 (×2): qty 1

## 2021-11-07 NOTE — Telephone Encounter (Signed)
Called 911 while Patient and Patient's neighbor stayed on the phone with the Environmental education officer. Informed the dispatcher of the Patient's full name, address and age. Informed the 911 dispatcher that an ambulance needs to be sent out for a wellness check on the Patient.  Then gave them the information below. Of Patient's symptoms and possible over use of medication. Informed them of medication name and dose.   Was then informed that EMS was on the way, to have the Patient and neighbor stay on the line with Nurse Supervisor until EMS arrived. Verified that this information was given to the nurse supervisor and that we would call 911 back should the situation change before EMS arrived. The dispatcher then disconnected the call.

## 2021-11-07 NOTE — Telephone Encounter (Signed)
DSS has Been contacted wit information to make sure patient is safe to remain at home.

## 2021-11-07 NOTE — ED Notes (Signed)
Pt urinated on self, refusing to allow this RN to change the sheet underneath him.

## 2021-11-07 NOTE — ED Notes (Signed)
Pt needs blue top unable to get all blood ordered. Lab called and informed.

## 2021-11-07 NOTE — ED Notes (Signed)
Pt had saturated with urine on linens. Pt cleaned up and clean gown and chux linen and brief placed. Pt given warm blanket.

## 2021-11-07 NOTE — ED Provider Notes (Signed)
Emergency Medicine Provider Triage Evaluation Note  Victor Daniels , a 75 y.o. male  was evaluated in triage.  Pt complains of pain, confusion.  Patient was seen in this department yesterday after being found wandering outside naked.  Patient admitted confused and a neighbor called EMS.  Patient was diagnosed with a UTI, left yesterday AGAINST MEDICAL ADVICE.  Patient was again found wandering, confused today.  He is complaining of diffuse neck and back pain.  Patient states that he does not believe he has fallen, thinks it is just "because I am old" but is unable to verbalize that he has not truly fallen.  He denied any other complaints at this time.  Patient is currently clutching his prescribed narcotics in his hand.  When asked if he is taking his antibiotics for his UTI, he states I do not know what that is and I am not taking anything for something I do not know anything about.  Review of Systems  Positive: Confusion, back pain, neck pain Negative: Patient denies fevers, is unsure but does not believe that he had any falls, denies chest pain, denies abdominal pain  Physical Exam  BP 99/66   Pulse 93   Temp 98 F (36.7 C)   Resp 18   SpO2 97%  Gen:   Awake, no distress   Resp:  Normal effort  MSK:   Moves extremities without difficulty  Other:  Patient still with himself.  Appears somewhat confused but does follow commands including cranial nerve testing.  No gross signs of trauma  Medical Decision Making  Medically screening exam initiated at 6:56 PM.  Appropriate orders placed.  EDUAR KUMPF was informed that the remainder of the evaluation will be completed by another provider, this initial triage assessment does not replace that evaluation, and the importance of remaining in the ED until their evaluation is complete.  Patient presented to the emergency department via EMS for confusion, back pain.  Patient been seen yesterday and left AGAINST MEDICAL ADVICE.  Patient  was diagnosed with acute kidney injury, syncope yesterday.  Patient was again found wandering outside mostly undressed by a neighbor and EMS was again called.  Patient does not believe that he had any falls or trauma but cannot verbalize that he truly had not fallen.  Given the confusion, again finding the patient wandering outside without clothes on despite the cold temperatures, patient will have labs, imaging performed at this time.   Racheal Patches, PA-C 11/07/21 1856    Delton Prairie, MD 11/07/21 [phone]

## 2021-11-07 NOTE — Telephone Encounter (Signed)
Patient took pain medication neighbor says that he took his clothes off last night and was in the yard, and 911 was called he went to the hospital but left against medical advice. He was given ABX for acute kidney failure, but advised needed additional testing admission but patient refused. Neighbor says this is the second time he has taken off his clothes and was found in the yard with no clothing,  Today patient is groggy and out of it, has not eaten since Saturday has had 2 cokes last food intake was a half a sandwich on Saturday,. Neighbor says since Friday that patient has taken approximately 19 of the Oxycodone 15 mg Immediate releae tablets and says he takes them often 2 tablets at a time. Neighbor says he does not act like himself, Neighbor says he is unable to take care of himself, says he is a danger to himself when he is taking the pain medication.and that he could get hurt. I have had another CMA call 911 while I remain on phone with patient. EMS arrived advised patient had been incoherent on phone with me , speech slurred,  advised what neighbor had relayed to nurse.

## 2021-11-07 NOTE — ED Triage Notes (Addendum)
Pt comes from home with c/o pain all over. Pt states he just hurts all over. Pt dx with UTI yesterday and given meds .  Pt states lower back pain and some neck pain.  Pt has several open wounds over bilateral legs with drainage and foul odor.

## 2021-11-07 NOTE — Telephone Encounter (Signed)
Pt neighbor Victor Daniels called in concern about Pt well being. Pt has not been eating but is drinking fluids. Neighbor Victor Daniels stated that Pt is like a zombie today. Victor Daniels stated that Pt went to Ed last night. Pt wonder outside with no clothes on. Victor Daniels stated that Pt doctor prescribe Pt with pills that he is taking over the amount that was prescribe.  Neighbor stated that she found the pills and counted them Pt has only have only 100 pills left. Victor Daniels stated that the Pt has a nurse that suppose to come to the house to care for Pt leg wound. Victor Daniels stated that she hasn't seen a nurse come to Pt house yet. Victor Daniels stated that Pt is doing crazy things when he takes to much medication. Neighbor Victor Daniels would like callback at [phone]

## 2021-11-07 NOTE — ED Notes (Signed)
Pt yelling out repeatedly for pain medication and assistance. Reporting he can not handle to pain any longer.

## 2021-11-07 NOTE — Telephone Encounter (Signed)
Secure chat that was sent to Dr. Darrick Huntsman by Dr. Scotty Court ED doctor:   Hi again Dr. Darrick Huntsman - this pt of yours was brought to the ED after neighbors found him disoriented in his yard. In the ED, he got tired of waiting for a treatment room and wanted to be discharged.  He has some lab abnormalities - Creatinine 4.2, WBC 20k.  He wasn't willing to be admitted or have any additional testing at that point, and he was lucid and had capacity, so he went home AMA.  His plan was to reach out to you tomorrow, so hopefully he is in touch soon. I'm worried he might have a severe infection, so I gave him a prescription for augmentin.  ok thanks  Shanda Bumps can you contact patient ?  if he can't do virtual he will need an In person appt  LMTCB with pt.

## 2021-11-07 NOTE — ED Notes (Signed)
Pt refusing second blood draw.

## 2021-11-08 ENCOUNTER — Inpatient Hospital Stay
Admit: 2021-11-08 | Discharge: 2021-11-08 | Disposition: A | Discharge status: Home / Self Care | Payer: Medicare HMO | Attending: Internal Medicine | Admitting: Internal Medicine

## 2021-11-08 ENCOUNTER — Encounter: Payer: Self-pay | Admitting: Internal Medicine

## 2021-11-08 ENCOUNTER — Inpatient Hospital Stay: Payer: Medicare HMO

## 2021-11-08 ENCOUNTER — Other Ambulatory Visit: Payer: Self-pay | Admitting: Internal Medicine

## 2021-11-08 ENCOUNTER — Inpatient Hospital Stay
Admission: EM | Admit: 2021-11-08 | Discharge: 2021-11-24 | DRG: 871 | Disposition: E | Payer: Medicare HMO | Attending: Student in an Organized Health Care Education/Training Program | Admitting: Student in an Organized Health Care Education/Training Program

## 2021-11-08 ENCOUNTER — Emergency Department: Payer: Medicare HMO

## 2021-11-08 DIAGNOSIS — E870 Hyperosmolality and hypernatremia: Secondary | ICD-10-CM | POA: Diagnosis not present

## 2021-11-08 DIAGNOSIS — N183 Chronic kidney disease, stage 3 unspecified: Secondary | ICD-10-CM | POA: Diagnosis not present

## 2021-11-08 DIAGNOSIS — L03116 Cellulitis of left lower limb: Secondary | ICD-10-CM

## 2021-11-08 DIAGNOSIS — I4891 Unspecified atrial fibrillation: Secondary | ICD-10-CM | POA: Diagnosis not present

## 2021-11-08 DIAGNOSIS — R6520 Severe sepsis without septic shock: Secondary | ICD-10-CM | POA: Diagnosis not present

## 2021-11-08 DIAGNOSIS — J15212 Pneumonia due to Methicillin resistant Staphylococcus aureus: Secondary | ICD-10-CM | POA: Diagnosis present

## 2021-11-08 DIAGNOSIS — L02213 Cutaneous abscess of chest wall: Secondary | ICD-10-CM

## 2021-11-08 DIAGNOSIS — J431 Panlobular emphysema: Secondary | ICD-10-CM | POA: Diagnosis not present

## 2021-11-08 DIAGNOSIS — I69311 Memory deficit following cerebral infarction: Secondary | ICD-10-CM | POA: Diagnosis not present

## 2021-11-08 DIAGNOSIS — J189 Pneumonia, unspecified organism: Secondary | ICD-10-CM

## 2021-11-08 DIAGNOSIS — I130 Hypertensive heart and chronic kidney disease with heart failure and stage 1 through stage 4 chronic kidney disease, or unspecified chronic kidney disease: Secondary | ICD-10-CM | POA: Diagnosis present

## 2021-11-08 DIAGNOSIS — I76 Septic arterial embolism: Secondary | ICD-10-CM

## 2021-11-08 DIAGNOSIS — I5032 Chronic diastolic (congestive) heart failure: Secondary | ICD-10-CM | POA: Diagnosis present

## 2021-11-08 DIAGNOSIS — E1122 Type 2 diabetes mellitus with diabetic chronic kidney disease: Secondary | ICD-10-CM | POA: Diagnosis present

## 2021-11-08 DIAGNOSIS — Z515 Encounter for palliative care: Secondary | ICD-10-CM | POA: Diagnosis not present

## 2021-11-08 DIAGNOSIS — I82409 Acute embolism and thrombosis of unspecified deep veins of unspecified lower extremity: Secondary | ICD-10-CM

## 2021-11-08 DIAGNOSIS — Z6835 Body mass index (BMI) 35.0-35.9, adult: Secondary | ICD-10-CM | POA: Diagnosis not present

## 2021-11-08 DIAGNOSIS — M6008 Infective myositis, other site: Secondary | ICD-10-CM | POA: Diagnosis present

## 2021-11-08 DIAGNOSIS — I361 Nonrheumatic tricuspid (valve) insufficiency: Secondary | ICD-10-CM | POA: Diagnosis not present

## 2021-11-08 DIAGNOSIS — G9349 Other encephalopathy: Secondary | ICD-10-CM | POA: Diagnosis present

## 2021-11-08 DIAGNOSIS — I872 Venous insufficiency (chronic) (peripheral): Secondary | ICD-10-CM

## 2021-11-08 DIAGNOSIS — R7881 Bacteremia: Secondary | ICD-10-CM | POA: Diagnosis not present

## 2021-11-08 DIAGNOSIS — I25118 Atherosclerotic heart disease of native coronary artery with other forms of angina pectoris: Secondary | ICD-10-CM | POA: Diagnosis not present

## 2021-11-08 DIAGNOSIS — J9610 Chronic respiratory failure, unspecified whether with hypoxia or hypercapnia: Secondary | ICD-10-CM | POA: Diagnosis present

## 2021-11-08 DIAGNOSIS — J449 Chronic obstructive pulmonary disease, unspecified: Secondary | ICD-10-CM | POA: Diagnosis present

## 2021-11-08 DIAGNOSIS — N179 Acute kidney failure, unspecified: Secondary | ICD-10-CM | POA: Diagnosis not present

## 2021-11-08 DIAGNOSIS — B9562 Methicillin resistant Staphylococcus aureus infection as the cause of diseases classified elsewhere: Secondary | ICD-10-CM

## 2021-11-08 DIAGNOSIS — G9341 Metabolic encephalopathy: Secondary | ICD-10-CM | POA: Diagnosis present

## 2021-11-08 DIAGNOSIS — L97309 Non-pressure chronic ulcer of unspecified ankle with unspecified severity: Secondary | ICD-10-CM

## 2021-11-08 DIAGNOSIS — I371 Nonrheumatic pulmonary valve insufficiency: Secondary | ICD-10-CM | POA: Diagnosis not present

## 2021-11-08 DIAGNOSIS — L0291 Cutaneous abscess, unspecified: Secondary | ICD-10-CM | POA: Diagnosis not present

## 2021-11-08 DIAGNOSIS — M00012 Staphylococcal arthritis, left shoulder: Secondary | ICD-10-CM | POA: Diagnosis present

## 2021-11-08 DIAGNOSIS — F1020 Alcohol dependence, uncomplicated: Secondary | ICD-10-CM | POA: Diagnosis present

## 2021-11-08 DIAGNOSIS — R52 Pain, unspecified: Secondary | ICD-10-CM | POA: Diagnosis not present

## 2021-11-08 DIAGNOSIS — Z7189 Other specified counseling: Secondary | ICD-10-CM | POA: Diagnosis not present

## 2021-11-08 DIAGNOSIS — I340 Nonrheumatic mitral (valve) insufficiency: Secondary | ICD-10-CM | POA: Diagnosis not present

## 2021-11-08 DIAGNOSIS — J440 Chronic obstructive pulmonary disease with acute lower respiratory infection: Secondary | ICD-10-CM | POA: Diagnosis present

## 2021-11-08 DIAGNOSIS — N1832 Chronic kidney disease, stage 3b: Secondary | ICD-10-CM | POA: Diagnosis present

## 2021-11-08 DIAGNOSIS — M60009 Infective myositis, unspecified site: Secondary | ICD-10-CM

## 2021-11-08 DIAGNOSIS — E669 Obesity, unspecified: Secondary | ICD-10-CM | POA: Diagnosis present

## 2021-11-08 DIAGNOSIS — A4102 Sepsis due to Methicillin resistant Staphylococcus aureus: Secondary | ICD-10-CM | POA: Diagnosis present

## 2021-11-08 DIAGNOSIS — Z66 Do not resuscitate: Secondary | ICD-10-CM | POA: Diagnosis not present

## 2021-11-08 DIAGNOSIS — I2510 Atherosclerotic heart disease of native coronary artery without angina pectoris: Secondary | ICD-10-CM | POA: Diagnosis present

## 2021-11-08 DIAGNOSIS — M009 Pyogenic arthritis, unspecified: Secondary | ICD-10-CM | POA: Diagnosis not present

## 2021-11-08 DIAGNOSIS — E1151 Type 2 diabetes mellitus with diabetic peripheral angiopathy without gangrene: Secondary | ICD-10-CM | POA: Diagnosis present

## 2021-11-08 DIAGNOSIS — E871 Hypo-osmolality and hyponatremia: Secondary | ICD-10-CM | POA: Diagnosis not present

## 2021-11-08 DIAGNOSIS — Z20822 Contact with and (suspected) exposure to covid-19: Secondary | ICD-10-CM | POA: Diagnosis present

## 2021-11-08 LAB — COMPREHENSIVE METABOLIC PANEL
ALT: 15 U/L (ref 0–44)
AST: 21 U/L (ref 15–41)
Albumin: 2.1 g/dL — ABNORMAL LOW (ref 3.5–5.0)
Alkaline Phosphatase: 164 U/L — ABNORMAL HIGH (ref 38–126)
Anion gap: 10 (ref 5–15)
BUN: 103 mg/dL — ABNORMAL HIGH (ref 8–23)
CO2: 34 mmol/L — ABNORMAL HIGH (ref 22–32)
Calcium: 8.5 mg/dL — ABNORMAL LOW (ref 8.9–10.3)
Chloride: 89 mmol/L — ABNORMAL LOW (ref 98–111)
Creatinine, Ser: 2.51 mg/dL — ABNORMAL HIGH (ref 0.61–1.24)
GFR, Estimated: 26 mL/min — ABNORMAL LOW (ref >60)
Glucose, Bld: 152 mg/dL — ABNORMAL HIGH (ref 70–99)
Potassium: 3.5 mmol/L (ref 3.5–5.1)
Sodium: 133 mmol/L — ABNORMAL LOW (ref 135–145)
Total Bilirubin: 2.3 mg/dL — ABNORMAL HIGH (ref 0.3–1.2)
Total Protein: 7.1 g/dL (ref 6.5–8.1)

## 2021-11-08 LAB — HEMOGLOBIN A1C
Hgb A1c MFr Bld: 6.1 % — ABNORMAL HIGH (ref 4.8–5.6)
Mean Plasma Glucose: 128.37 mg/dL

## 2021-11-08 LAB — BLOOD CULTURE ID PANEL (REFLEXED) - BCID2

## 2021-11-08 LAB — CBG MONITORING, ED
Glucose-Capillary: 137 mg/dL — ABNORMAL HIGH (ref 70–99)
Glucose-Capillary: 118 mg/dL — ABNORMAL HIGH (ref 70–99)

## 2021-11-08 LAB — MAGNESIUM: Magnesium: 2 mg/dL (ref 1.7–2.4)

## 2021-11-08 LAB — RESP PANEL BY RT-PCR (FLU A&B, COVID) ARPGX2
Influenza A by PCR: NEGATIVE
Influenza B by PCR: NEGATIVE
SARS Coronavirus 2 by RT PCR: NEGATIVE

## 2021-11-08 LAB — TROPONIN I (HIGH SENSITIVITY): Troponin I (High Sensitivity): 47 ng/L — ABNORMAL HIGH (ref <18)

## 2021-11-08 MED ORDER — VITAMIN B-12 1000 MCG PO TABS
1000.0000 ug | ORAL_TABLET | Freq: Every day | ORAL | Status: DC
  Administered 2021-11-08 – 2021-11-14 (×7): 1000 ug via ORAL
  Filled 2021-11-08 (×7): qty 1

## 2021-11-08 MED ORDER — RIVAROXABAN 20 MG PO TABS
20.0000 mg | ORAL_TABLET | Freq: Every day | ORAL | Status: DC
  Administered 2021-11-08 – 2021-11-09 (×2): 20 mg via ORAL
  Filled 2021-11-08 (×3): qty 1

## 2021-11-08 MED ORDER — NITROGLYCERIN 0.4 MG SL SUBL: 0.4000 mg | SUBLINGUAL_TABLET | SUBLINGUAL | Status: DC | PRN

## 2021-11-08 MED ORDER — IPRATROPIUM-ALBUTEROL 0.5-2.5 (3) MG/3ML IN SOLN: 3.0000 mL | Freq: Four times a day (QID) | RESPIRATORY_TRACT | Status: DC | PRN

## 2021-11-08 MED ORDER — SIMVASTATIN 20 MG PO TABS
20.0000 mg | ORAL_TABLET | Freq: Every day | ORAL | Status: DC
  Administered 2021-11-09 – 2021-11-10 (×2): 20 mg via ORAL
  Filled 2021-11-08 (×2): qty 1

## 2021-11-08 MED ORDER — FLUTICASONE-UMECLIDIN-VILANT 100-62.5-25 MCG/ACT IN AEPB
1.0000 | INHALATION_SPRAY | Freq: Every day | RESPIRATORY_TRACT | Status: DC
Start: 2021-11-08 — End: 2021-11-08

## 2021-11-08 MED ORDER — UMECLIDINIUM BROMIDE 62.5 MCG/ACT IN AEPB
1.0000 | INHALATION_SPRAY | Freq: Every day | RESPIRATORY_TRACT | Status: DC
Start: 2021-11-08 — End: 2021-11-14
  Administered 2021-11-10 – 2021-11-14 (×5): 1 via RESPIRATORY_TRACT
  Filled 2021-11-08 (×4): qty 7

## 2021-11-08 MED ORDER — ONDANSETRON HCL 4 MG PO TABS: 4.0000 mg | ORAL_TABLET | Freq: Four times a day (QID) | ORAL | Status: DC | PRN

## 2021-11-08 MED ORDER — ONDANSETRON HCL 4 MG/2ML IJ SOLN: 4.0000 mg | Freq: Four times a day (QID) | INTRAMUSCULAR | Status: DC | PRN

## 2021-11-08 MED ORDER — OXYCODONE HCL 5 MG PO TABS
15.0000 mg | ORAL_TABLET | Freq: Four times a day (QID) | ORAL | Status: DC | PRN
  Administered 2021-11-08 – 2021-11-14 (×10): 15 mg via ORAL
  Filled 2021-11-08 (×11): qty 3

## 2021-11-08 MED ORDER — PERFLUTREN LIPID MICROSPHERE
1.0000 mL | INTRAVENOUS | Status: AC | PRN
  Administered 2021-11-08: 2 mL via INTRAVENOUS
  Filled 2021-11-08: qty 10

## 2021-11-08 MED ORDER — VANCOMYCIN HCL IN DEXTROSE 1-5 GM/200ML-% IV SOLN
1000.0000 mg | Freq: Once | INTRAVENOUS | Status: AC
  Administered 2021-11-08: 1000 mg via INTRAVENOUS
  Filled 2021-11-08: qty 200

## 2021-11-08 MED ORDER — LACTATED RINGERS IV BOLUS
500.0000 mL | Freq: Once | INTRAVENOUS | Status: AC
  Administered 2021-11-08: 500 mL via INTRAVENOUS

## 2021-11-08 MED ORDER — SODIUM CHLORIDE 0.9 % IV SOLN: INTRAVENOUS | Status: AC

## 2021-11-08 MED ORDER — PIPERACILLIN-TAZOBACTAM 3.375 G IVPB 30 MIN
3.3750 g | Freq: Once | INTRAVENOUS | Status: AC
  Administered 2021-11-08: 3.375 g via INTRAVENOUS
  Filled 2021-11-08: qty 50

## 2021-11-08 MED ORDER — VANCOMYCIN HCL 1500 MG/300ML IV SOLN
1500.0000 mg | Freq: Once | INTRAVENOUS | Status: AC
  Administered 2021-11-08: 1500 mg via INTRAVENOUS
  Filled 2021-11-08: qty 300

## 2021-11-08 MED ORDER — VANCOMYCIN VARIABLE DOSE PER UNSTABLE RENAL FUNCTION (PHARMACIST DOSING)
Status: DC
Start: 2021-11-08 — End: 2021-11-09

## 2021-11-08 MED ORDER — ONDANSETRON HCL 4 MG/2ML IJ SOLN
4.0000 mg | Freq: Once | INTRAMUSCULAR | Status: AC
  Administered 2021-11-08: 4 mg via INTRAVENOUS
  Filled 2021-11-08: qty 2

## 2021-11-08 MED ORDER — ACETAMINOPHEN 650 MG RE SUPP
650.0000 mg | Freq: Four times a day (QID) | RECTAL | Status: DC | PRN
  Filled 2021-11-08: qty 1

## 2021-11-08 MED ORDER — FLUTICASONE FUROATE-VILANTEROL 100-25 MCG/ACT IN AEPB
1.0000 | INHALATION_SPRAY | Freq: Every day | RESPIRATORY_TRACT | Status: DC
  Administered 2021-11-08 – 2021-11-14 (×7): 1 via RESPIRATORY_TRACT
  Filled 2021-11-08 (×2): qty 28

## 2021-11-08 MED ORDER — MORPHINE SULFATE (PF) 4 MG/ML IV SOLN
4.0000 mg | Freq: Once | INTRAVENOUS | Status: AC
  Administered 2021-11-08: 4 mg via INTRAVENOUS
  Filled 2021-11-08: qty 1

## 2021-11-08 MED ORDER — INSULIN ASPART 100 UNIT/ML IJ SOLN
0.0000 [IU] | Freq: Three times a day (TID) | INTRAMUSCULAR | Status: DC
Start: 2021-11-08 — End: 2021-11-14
  Administered 2021-11-08 – 2021-11-10 (×6): 2 [IU] via SUBCUTANEOUS
  Administered 2021-11-11: 3 [IU] via SUBCUTANEOUS
  Administered 2021-11-11: 2 [IU] via SUBCUTANEOUS
  Administered 2021-11-11: 3 [IU] via SUBCUTANEOUS
  Administered 2021-11-12: 2 [IU] via SUBCUTANEOUS
  Administered 2021-11-12 (×2): 3 [IU] via SUBCUTANEOUS
  Administered 2021-11-13 (×3): 2 [IU] via SUBCUTANEOUS
  Administered 2021-11-14: 3 [IU] via SUBCUTANEOUS
  Filled 2021-11-08 (×16): qty 1

## 2021-11-08 MED ORDER — ACETAMINOPHEN 325 MG PO TABS
650.0000 mg | ORAL_TABLET | Freq: Four times a day (QID) | ORAL | Status: DC | PRN
  Administered 2021-11-10: 09:00:00 650 mg via ORAL
  Filled 2021-11-08: qty 2

## 2021-11-08 MED ORDER — VANCOMYCIN HCL IN DEXTROSE 1-5 GM/200ML-% IV SOLN: 1000.0000 mg | Freq: Once | INTRAVENOUS | Status: DC

## 2021-11-08 NOTE — Progress Notes (Signed)
PHARMACY - PHYSICIAN COMMUNICATION CRITICAL VALUE ALERT - BLOOD CULTURE IDENTIFICATION (BCID)  Victor Daniels is an 75 y.o. male who presented to [facility] on 11/15/2021 with a chief complaint of generalized weakness.  Assessment:  Presents with worsening diffuse pain after leaving [facility] 2 days prior. CT c/w possible septic emboli and septic arthritis. Blood cultures growing GPCs 2/4 bottles in both sets. BCID detected S. Aureus with +MecA.  Name of physician (or Provider) Contacted: Dr. Joylene Igo  Current antibiotics: One time doses of vancomycin and piperacillin/tazobactam  Changes to prescribed antibiotics recommended:  Recommendations accepted by provider. Will proceed with vancomycin for this MRSA bacteremia. ID has been consulted.  Results for orders placed or performed during the hospital encounter of 10/31/2021  Blood Culture ID Panel (Reflexed) (Collected: 11/07/2021  7:33 PM)  Result Value Ref Range   Enterococcus faecalis NOT DETECTED NOT DETECTED   Enterococcus Faecium NOT DETECTED NOT DETECTED   Listeria monocytogenes NOT DETECTED NOT DETECTED   Staphylococcus species DETECTED (A) NOT DETECTED   Staphylococcus aureus (BCID) DETECTED (A) NOT DETECTED   Staphylococcus epidermidis NOT DETECTED NOT DETECTED   Staphylococcus lugdunensis NOT DETECTED NOT DETECTED   Streptococcus species NOT DETECTED NOT DETECTED   Streptococcus agalactiae NOT DETECTED NOT DETECTED   Streptococcus pneumoniae NOT DETECTED NOT DETECTED   Streptococcus pyogenes NOT DETECTED NOT DETECTED   A.calcoaceticus-baumannii NOT DETECTED NOT DETECTED   Bacteroides fragilis NOT DETECTED NOT DETECTED   Enterobacterales NOT DETECTED NOT DETECTED   Enterobacter cloacae complex NOT DETECTED NOT DETECTED   Escherichia coli NOT DETECTED NOT DETECTED   Klebsiella aerogenes NOT DETECTED NOT DETECTED   Klebsiella oxytoca NOT DETECTED NOT DETECTED   Klebsiella pneumoniae NOT DETECTED NOT DETECTED   Proteus species  NOT DETECTED NOT DETECTED   Salmonella species NOT DETECTED NOT DETECTED   Serratia marcescens NOT DETECTED NOT DETECTED   Haemophilus influenzae NOT DETECTED NOT DETECTED   Neisseria meningitidis NOT DETECTED NOT DETECTED   Pseudomonas aeruginosa NOT DETECTED NOT DETECTED   Stenotrophomonas maltophilia NOT DETECTED NOT DETECTED   Candida albicans NOT DETECTED NOT DETECTED   Candida auris NOT DETECTED NOT DETECTED   Candida glabrata NOT DETECTED NOT DETECTED   Candida krusei NOT DETECTED NOT DETECTED   Candida parapsilosis NOT DETECTED NOT DETECTED   Candida tropicalis NOT DETECTED NOT DETECTED   Cryptococcus neoformans/gattii NOT DETECTED NOT DETECTED   Meth resistant mecA/C and MREJ DETECTED (A) NOT DETECTED    Jaynie Bream, PharmD Pharmacy Resident  11/18/2021 1:15 PM

## 2021-11-08 NOTE — ED Provider Notes (Signed)
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 pain.  This is patient's second visit to the ER.  Patient does not seem to be able to take care of himself at home.  Has been nonambulatory because of the leg pain and infection.  Most likely required admission to the hospitalist. Patient is placed on telemetry for close monitoring of cardiorespiratory status.  Old medical records reviewed including most recent echo from 2021 and most recent visit with patient's PCP.  _________________________ 5:44 AM on 11/07/2021 -----------------------------------------  CT of the chest is extremely abnormal showing a suspected left pectoralis abscess with a left chest wall mass containing gas and intramuscular fluid.  Patient does have swelling in that area but the skin overlying it is normal and is not particularly tender on palpation.  CT proceeds to read as a septic left sternoclavicular joint, superimposed bilateral lower lobe pneumonia and consider septic emboli.  Patient already received Zosyn and Vanco.  We will consult the hospitalist service for admission  for further management.     _____________________________________________ Please note:  Patient was evaluated in Emergency Department today for the symptoms described in the history of present illness. Patient was evaluated in the context of the global COVID-19 pandemic, which necessitated consideration that the patient might be at risk for infection with the SARS-CoV-2 virus that causes COVID-19. Institutional protocols and algorithms that pertain to the evaluation of patients at risk for COVID-19 are in a state of rapid change based on information released by regulatory bodies including the CDC and federal and state organizations. These policies and algorithms were followed during the patient's care in the ED.  Some ED evaluations and interventions may be delayed as a result of limited staffing during the pandemic.   [place] Controlled Substance Database was reviewed by me. ____________________________________________   FINAL CLINICAL IMPRESSION(S) / ED DIAGNOSES   Final diagnoses:  Cellulitis of left lower extremity  Abscess of chest wall  Septic arthritis of left sternoclavicular joint (HCC)  Septic embolism (HCC)  Pneumonia of both lungs due to infectious organism, unspecified part of lung      NEW MEDICATIONS STARTED DURING THIS VISIT:  ED Discharge Orders     None        Note:  This document was prepared using Dragon voice recognition software and may include unintentional dictation errors.    Rudene Re, MD 10/30/2021 [phone]

## 2021-11-08 NOTE — ED Notes (Signed)
Wound care RN at bedside  

## 2021-11-08 NOTE — ED Notes (Addendum)
Pt bedding and self changed.   Pt legs was dressed and cleaned. (Weeping leg)

## 2021-11-08 NOTE — ED Notes (Signed)
US at bedside

## 2021-11-08 NOTE — ED Notes (Signed)
Patient given warm blanket at this time.  

## 2021-11-08 NOTE — ED Notes (Signed)
Patient sleeping at this time. NAD noted. °

## 2021-11-08 NOTE — ED Notes (Signed)
Patient given cola at this time. Okay to give per Agbata, MD. MD aware that no admission orders have been placed.

## 2021-11-08 NOTE — ED Notes (Signed)
Multiple messages sent to pharmacy regarding getting patient's inhaler. Not in department at this time. 1st message sent over 2 hours ago.

## 2021-11-08 NOTE — Consult Note (Signed)
I have placed a request via Secure Chat to Dr. Agbata requesting photos of the wound areas of concern to be placed in the EMR.    Buster Schueller MSN,RN,CWOCN, CNS, CWON-AP [phone]  

## 2021-11-08 NOTE — ED Notes (Signed)
Bladder scan x4 with average of 71 ml

## 2021-11-08 NOTE — ED Notes (Addendum)
Patient found in room with wet sheets and with urinal under him. Patient cleaned up and new liens placed. Patient assisted to use restroom with urinal. Patient repositioned at this time. Bed in lowest position and call light within reach.

## 2021-11-08 NOTE — H&P (Signed)
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 torsemide, spironolactone, metolazone and Cozaar due to worsening renal function.  DVT prophylaxis: Xarelto Code Status: full code  Family Communication: Greater than 50% of time was spent discussing patient's condition and plan of care with him at the bedside.  All questions and concerns have been addressed.  He verbalizes understanding and agrees with the plan.  CODE STATUS was discussed and he wishes to be a full code.  He lists his friend Conley Rolls as his healthcare power of attorney. Disposition Plan: Back to previous home environment Consults called: Infectious disease Status:At the time of admission, it appears that the appropriate admission status for this patient is inpatient. This is judged to be reasonable and necessary to provide the required intensity of service to ensure the patient's safety given the presenting symptoms, physical exam findings, and initial radiographic and laboratory data in the context of their comorbid  conditions. Patient requires inpatient status due to high intensity of service, high risk for further deterioration and high frequency of surveillance required.     Collier Bullock MD [facility]     10/29/2021, 12:11 PM

## 2021-11-08 NOTE — Progress Notes (Addendum)
Pharmacy Antibiotic Note  Victor Daniels is a 75 y.o. male admitted on 11/21/2021 with bacteremia,  cellulitis as suspected source. Pharmacy has been consulted for vancomycin dosing.   Presents with worsening diffuse pain after leaving AMA 2 days prior. PMH includes CAD, CHF (EF 25%), Stage 3 CKD, CVA and PAD. CT shows superimposed bilateral lower lobe pneumonia with possible septic emboli as well as septic arthritis of left sternoclavicular joint/abscess of the pectoralis muscle.  Received vancomycin and piperacillin/tazobactam x1 in ED. Blood cultures resulting MRSA 1/2 bottles. Vancomycin given this AM @0430 . Due to poor renal function, will dose via random levels.  Plan: Vancomycin 1,500 mg x 1 now Vancomycin random level in the morning Monitor renal function closely, clinical course, LOT   Weight: 117.1 kg (258 lb 2.5 oz)  Temp (24hrs), Avg:98.1 F (36.7 C), Min:98 F (36.7 C), Max:98.1 F (36.7 C)  Recent Labs  Lab 11/06/21 1529 11/07/21 1854 11/07/21 1922 10/28/2021 0236  WBC 20.5* 18.4*  --   --   CREATININE 4.20*  --   --  2.51*  LATICACIDVEN  --   --  1.5  --     Estimated Creatinine Clearance: 33.6 mL/min (A) (by C-G formula based on SCr of 2.51 mg/dL (H)).    Allergies  Allergen Reactions   Clonidine Derivatives     Reaction unknown   Sulfa Antibiotics     Reaction unknown    Antimicrobials this admission: 11/15 vancomycin >>  11/15 Zosyn >>   Dose adjustments this admission:   Microbiology results: 11/14 BCx: GPCs 2/4 bottles in both sets; BCID MRSA 11/14 UCx: sent    Thank you for allowing pharmacy to be a part of this patient's care.   12/14, PharmD Pharmacy Resident  11/07/2021 12:50 PM

## 2021-11-08 NOTE — Consult Note (Addendum)
WOC Nurse Consult Note: Reason for Consult:LE wounds Patient resting in ED room, will not answer questions, will not open eyes for WOC nurse. No other family in the room.  Patient with AMS, wandering naked in his front yard, delusional, has left AMA in the last few days from ED. DSS called to report patient's behaviors at home.  Followed by primary care for LE venous insufficiency.  Recently DC from [facility], therefore compression therapy was stopped. Not sure patient would be compliant with compression stockings  No ABI results located in chart  Wound type:venous insufficiency with ulcerations L>R Pressure Injury POA: NA Measurement: Left LE with circumferential weeping and ulcerations that are partial thickness and almost linear in appearance(covers entire LLE calf and pretibial region Right LE; scattered areas of eschar/scabbing also with some linear in appearance areas. Not truly opened wounds.  May have some excoriation as well on this RLE which is common with venous dermatitis  Wound bed:see above  Drainage (amount, consistency, odor) serosanguinous, no odor  Periwound: edema  Dressing procedure/placement/frequency: Single layer of xeroform gauze over open weeping wounds of the LLE and the scabbed and open areas of the RLE. Top with ABD pads on the LLE for drainage. Wrap from toes to pattellar notch with ACE wraps  Change daily.   Follow up in the outpatient wound care clinic, appears primary care MD just made referral to restart [facility] and return to outpatient wound center. Would not provide therapeutic compression inpatient until ABIs obtained.    Re consult if needed, will not follow at this time. Thanks  Hisashi Amadon [employer], RN,CWOCN, CNS, CWON-AP [phone])

## 2021-11-08 NOTE — ED Notes (Signed)
Patient's bed wet. Patient cleaned and liens changed. New gown applied. Patient repositioned in bed at this time.
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
## 2021-11-09 DIAGNOSIS — R7881 Bacteremia: Secondary | ICD-10-CM | POA: Diagnosis not present

## 2021-11-09 DIAGNOSIS — B9562 Methicillin resistant Staphylococcus aureus infection as the cause of diseases classified elsewhere: Secondary | ICD-10-CM | POA: Diagnosis not present

## 2021-11-09 LAB — BASIC METABOLIC PANEL
Anion gap: 9 (ref 5–15)
BUN: 92 mg/dL — ABNORMAL HIGH (ref 8–23)
CO2: 37 mmol/L — ABNORMAL HIGH (ref 22–32)
Calcium: 8.7 mg/dL — ABNORMAL LOW (ref 8.9–10.3)
Chloride: 93 mmol/L — ABNORMAL LOW (ref 98–111)
Creatinine, Ser: 1.82 mg/dL — ABNORMAL HIGH (ref 0.61–1.24)
GFR, Estimated: 38 mL/min — ABNORMAL LOW (ref >60)
Glucose, Bld: 136 mg/dL — ABNORMAL HIGH (ref 70–99)
Potassium: 3.6 mmol/L (ref 3.5–5.1)
Sodium: 139 mmol/L (ref 135–145)

## 2021-11-09 LAB — CBC
HCT: 29.9 % — ABNORMAL LOW (ref 39.0–52.0)
Hemoglobin: 9.6 g/dL — ABNORMAL LOW (ref 13.0–17.0)
MCH: 25.6 pg — ABNORMAL LOW (ref 26.0–34.0)
MCHC: 32.1 g/dL (ref 30.0–36.0)
MCV: 79.7 fL — ABNORMAL LOW (ref 80.0–100.0)
Platelets: 122 10*3/uL — ABNORMAL LOW (ref 150–400)
RBC: 3.75 MIL/uL — ABNORMAL LOW (ref 4.22–5.81)
RDW: 16.9 % — ABNORMAL HIGH (ref 11.5–15.5)
WBC: 18.6 10*3/uL — ABNORMAL HIGH (ref 4.0–10.5)
nRBC: 0.3 % — ABNORMAL HIGH (ref 0.0–0.2)

## 2021-11-09 LAB — VANCOMYCIN, RANDOM: Vancomycin Rm: 16

## 2021-11-09 LAB — CBG MONITORING, ED
Glucose-Capillary: 131 mg/dL — ABNORMAL HIGH (ref 70–99)
Glucose-Capillary: 134 mg/dL — ABNORMAL HIGH (ref 70–99)
Glucose-Capillary: 144 mg/dL — ABNORMAL HIGH (ref 70–99)
Glucose-Capillary: 129 mg/dL — ABNORMAL HIGH (ref 70–99)

## 2021-11-09 LAB — ECHOCARDIOGRAM COMPLETE
S' Lateral: 2.8 cm
Weight: 4130.54 oz

## 2021-11-09 MED ORDER — VANCOMYCIN HCL 1500 MG/300ML IV SOLN
1500.0000 mg | INTRAVENOUS | Status: DC
  Administered 2021-11-09 – 2021-11-10 (×2): 1500 mg via INTRAVENOUS
  Filled 2021-11-09 (×2): qty 300

## 2021-11-09 NOTE — ED Notes (Signed)
Pt given food and drink. This nurse had to food pt. Pt was unable to use hands. Bed changed

## 2021-11-09 NOTE — Evaluation (Signed)
Physical Therapy Evaluation Patient Details Name: BROADY ALMAS MRN: [id number] DOB: June 17, 1946 Today's Date: 11/09/2021  History of Present Illness  Pt is a 75 y.o. male with medical history significant for diabetes mellitus with complications of stage III chronic kidney disease, CAD, COPD with chronic respiratory failure on 2.5 L of oxygen continuous, chronic diastolic heart failure with last known LVEF of 55% from 2021, history of CVA, chronic venous stasis ulceration and dermatitis  who presents to the ER for the second time in 2 days with complaints of pain all over but mostly in his lower extremities. MD assessment includes: sepsis, pneumonia, left sternal clavicular joint septic arthritis, left pectoralis muscles infection/abscess, skin infection both lower extremity, possible septic emboli, bilateral lower extremity chronic venous stasis and edema with chronic wounds cellulitis, scrotal edema, and acute on chronic kidney disease stage III.   Clinical Impression  Pt was lethargic, but eager to get up and to participate during the session. Pt was able to answer history questions, but needed many repeated to get response. Pt had difficulty following multi modal cuing for all ther ex in bed. Pt required +2 max assist for bed mobility and cuing for hand placement. Pt demonstrated poor sitting balance, left lateral lean, and could not keep hands by side to hold himself up even after multiple cuing attempts. Pt could not perform STS due to BUE and BLE weakness even after multi modal cuing for proper sequencing and hand/foot placement. Pt attempted one STS but was unable to clear the mattress. Pt will benefit from PT services in a SNF setting upon discharge to safely address deficits listed in patient problem list for decreased caregiver assistance and eventual return to PLOF.   Recommendations for follow up therapy are one component of a multi-disciplinary discharge planning process, led by the  attending physician.  Recommendations may be updated based on patient status, additional functional criteria and insurance authorization.  Follow Up Recommendations Skilled nursing-short term rehab (<3 hours/day)    Assistance Recommended at Discharge Frequent or constant Supervision/Assistance  Functional Status Assessment Patient has had a recent decline in their functional status and demonstrates the ability to make significant improvements in function in a reasonable and predictable amount of time.  Equipment Recommendations  None recommended by PT    Recommendations for Other Services       Precautions / Restrictions Precautions Precautions: Fall Restrictions Weight Bearing Restrictions: No      Mobility  Bed Mobility Overal bed mobility: Needs Assistance Bed Mobility: Supine to Sit;Sit to Supine     Supine to sit: +2 for physical assistance;Max assist Sit to supine: +2 for physical assistance;Max assist   General bed mobility comments: Pt needed +2 max assist for any bed mobility due to generalized BUE and BLE weakness and the pt reporting "all over" pain.    Transfers Overall transfer level: Needs assistance Equipment used: Rolling walker (2 wheels) Transfers: Sit to/from Stand Sit to Stand: Max assist;+2 physical assistance           General transfer comment: Pt required +2 max assist to attmept a STS due to BLE weakness, needed max cuing and guidance for hand placement on RW with PTs holding pt's hands in place, needed mod cuing for proper STS sequencing, pt could NOT clear mattress surface with attempt to STS    Ambulation/Gait               General Gait Details: NT  Stairs  Wheelchair Mobility    Modified Rankin (Stroke Patients Only)       Balance Overall balance assessment: Needs assistance Sitting-balance support: Feet supported;No upper extremity supported Sitting balance-Leahy Scale: Poor Sitting balance - Comments: Pt  was unable to place hands beside him on the bed to hold himself up and needed max assist from PT on L side to stay upright, pt did not exhibit a righting response when tested on the L side Postural control: Left lateral lean     Standing balance comment: NT                             Pertinent Vitals/Pain Pain Assessment: 0-10 Pain Score: 7  Pain Location: Whole body Pain Descriptors / Indicators: Discomfort;Aching;Grimacing Pain Intervention(s): Repositioned;Premedicated before session;Limited activity within patient's tolerance    Home Living Family/patient expects to be discharged to:: Private residence Living Arrangements: Alone Available Help at Discharge: Other (Comment) (Does not report any support at home) Type of Home: House Home Access: Stairs to enter Entrance Stairs-Rails: None Entrance Stairs-Number of Steps: 1   Home Layout: One level Home Equipment: Conservation officer, nature (2 wheels);Rollator (4 wheels);Cane - single point;Tub bench      Prior Function Prior Level of Function : Independent/Modified Independent             Mobility Comments: Pt reports not requiring any assistance with mobility and did not need to use an AD, reports falling once in the past 6 months, PRN SPC ADLs Comments: Pt reports not needing any assistance with ADLs     Hand Dominance        Extremity/Trunk Assessment   Upper Extremity Assessment Upper Extremity Assessment: Generalized weakness    Lower Extremity Assessment Lower Extremity Assessment: Generalized weakness       Communication   Communication: HOH  Cognition Arousal/Alertness: Lethargic Behavior During Therapy: WFL for tasks assessed/performed Overall Cognitive Status: No family/caregiver present to determine baseline cognitive functioning                                          General Comments      Exercises Total Joint Exercises Ankle Circles/Pumps: AROM;Both;Supine (Pt had  great difficulty completing exercise LLE>RLE) Quad Sets: AROM;Both;Supine (Pt demonstrated activation but did not show much movement) Knee Flexion: AAROM;Both;Supine (Pt required mod assist to flex BLE) Other Exercises: Pt AAROM for elbow flexion to pt's tolerance  Other Exercises: Pt education on benefits of movement for joints and to reduce pain Other exercises: R lateral weight shifting activities in sitting to address left lateral lean   Assessment/Plan    PT Assessment Patient needs continued PT services  PT Problem List Decreased strength;Decreased activity tolerance;Decreased balance;Decreased mobility;Decreased range of motion;Decreased knowledge of use of DME;Pain;Decreased skin integrity       PT Treatment Interventions DME instruction;Gait training;Stair training;Functional mobility training;Therapeutic activities;Therapeutic exercise;Balance training;Patient/family education    PT Goals (Current goals can be found in the Care Plan section)  Acute Rehab PT Goals Patient Stated Goal: To go home PT Goal Formulation: With patient Time For Goal Achievement: 11/22/21 Potential to Achieve Goals: [place] (Pt was here back in June and was doing fairly well, so if we can get him stable, he will have a better chance at achieving his goal.)    Frequency Min 2X/week   Barriers to discharge  Co-evaluation               AM-PAC PT "6 Clicks" Mobility  Outcome Measure Help needed turning from your back to your side while in a flat bed without using bedrails?: A Lot Help needed moving from lying on your back to sitting on the side of a flat bed without using bedrails?: A Lot Help needed moving to and from a bed to a chair (including a wheelchair)?: Total Help needed standing up from a chair using your arms (e.g., wheelchair or bedside chair)?: A Lot Help needed to walk in hospital room?: Total Help needed climbing 3-5 steps with a railing? : Total 6 Click Score: 9    End  of Session Equipment Utilized During Treatment: Gait belt;Oxygen Activity Tolerance: Patient limited by lethargy Patient left: in bed;with call bell/phone within reach Nurse Communication: Mobility status PT Visit Diagnosis: Muscle weakness (generalized) (M62.81);Difficulty in walking, not elsewhere classified (R26.2);Pain Pain - Right/Left:  (both) Pain - part of body:  ("Whole body")    Time: 4650-3546 PT Time Calculation (min) (ACUTE ONLY): 39 min   Charges:              Hildred Alamin SPT 11/09/21, 5:26 PM

## 2021-11-09 NOTE — Progress Notes (Signed)
Patient incontinent of urine, changed and repositioned. Sips of diet coke given upon request.

## 2021-11-09 NOTE — Progress Notes (Addendum)
[facility]  - [place] [facility]   PATIENT NAME: Victor Daniels    MR#:  [id number]  DATE OF BIRTH:  [birth year]  SUBJECTIVE:   patient came in after he was found with altered mental status in his backyard. His neighbors called EMS. Found to have weeping ulcers with significant discharge both lower extremity left worse than right. Patient not able to get much history. Complains of all over pain. No family in the room. REVIEW OF SYSTEMS:   Review of Systems  Constitutional:  Negative for chills, fever and weight loss.  HENT:  Negative for ear discharge, ear pain and nosebleeds.   Eyes:  Negative for blurred vision, pain and discharge.  Respiratory:  Positive for shortness of breath. Negative for sputum production, wheezing and stridor.   Cardiovascular:  Negative for chest pain, palpitations, orthopnea and PND.  Gastrointestinal:  Negative for abdominal pain, diarrhea, nausea and vomiting.  Genitourinary:  Negative for frequency and urgency.  Musculoskeletal:  Positive for back pain and joint pain.  Neurological:  Negative for sensory change, speech change, focal weakness and weakness.  Psychiatric/Behavioral:  Negative for depression and hallucinations. The patient is not nervous/anxious.   Tolerating Diet: Tolerating PT:   DRUG ALLERGIES:   Allergies  Allergen Reactions   Clonidine Derivatives     Reaction unknown   Sulfa Antibiotics     Reaction unknown    VITALS:  Blood pressure (!) 144/91, pulse (!) 104, temperature 98.1 F (36.7 C), temperature source Oral, resp. rate 13, weight 117.1 kg, SpO2 97 %.  PHYSICAL EXAMINATION:   Physical Exam  GENERAL:  75 y.o.-year-old patient lying in the bed with no acute distress. Disheveled, poor hygiene HEENT: Head atraumatic, normocephalic. Oropharynx and nasopharynx clear.  LUNGS: decreased breath sounds bilaterally, no wheezing, rales, rhonchi. No use of accessory muscles of respiration.   CARDIOVASCULAR: S1, S2 normal. No murmurs, rubs, or gallops. tachycardia ABDOMEN: Soft, nontender, nondistended. Bowel sounds present. No organomegaly or mass.  EXTREMITIES: Left LE with circumferential weeping and ulcerations that are partial thickness and almost linear in appearance(covers entire LLE calf and pretibial region Right LE; scattered areas of eschar/scabbing also with some linear in appearance areas. Not truly opened wounds.  May have some excoriation as well on this RLE which is common with venous dermatitis  NEUROLOGIC: nonfocal PSYCHIATRIC:  patient is alert SKIN:as above  LABORATORY PANEL:  CBC Recent Labs  Lab 11/09/21 0721  WBC 18.6*  HGB 9.6*  HCT 29.9*  PLT 122*    Chemistries  Recent Labs  Lab 2021/11/24 0236 11/09/21 0721  NA 133* 139  K 3.5 3.6  CL 89* 93*  CO2 34* 37*  GLUCOSE 152* 136*  BUN 103* 92*  CREATININE 2.51* 1.82*  CALCIUM 8.5* 8.7*  MG 2.0  --   AST 21  --   ALT 15  --   ALKPHOS 164*  --   BILITOT 2.3*  --    Cardiac Enzymes No results for input(s): TROPONINI in the last 168 hours. RADIOLOGY:  DG Chest 2 View  Result Date: 11/07/2021 CLINICAL DATA:  Weakness and confusion. EXAM: CHEST - 2 VIEW COMPARISON:  Chest radiograph dated 05/29/2021. FINDINGS: Bibasilar linear and streaky atelectasis/scarring. An area of increased density at the left lung base noted. Pneumonia is not excluded. Clinical correlation is recommended. There is blunting of the right costophrenic angle which may represent scarring or trace pleural effusion. No pneumothorax. Mild cardiomegaly. No acute osseous pathology. Degenerative changes of the spine.  IMPRESSION: 1. Left lung base atelectasis versus infiltrate. 2. Mild cardiomegaly. Electronically Signed   By: Anner Crete M.D.   On: 11/07/2021 21:25   DG Thoracic Spine 2 View  Result Date: 11/07/2021 CLINICAL DATA:  Fall, back pain EXAM: THORACIC SPINE 2 VIEWS COMPARISON:  None. FINDINGS: Normal alignment.  No fracture. Flowing anterior osteophytes. No focal bone lesion. IMPRESSION: No acute bony abnormality. Electronically Signed   By: Rolm Baptise M.D.   On: 11/07/2021 21:26   DG Lumbar Spine 2-3 Views  Result Date: 11/07/2021 CLINICAL DATA:  Fall, back pain EXAM: LUMBAR SPINE - 2-3 VIEW COMPARISON:  None. FINDINGS: Normal alignment. No fracture. Diffuse degenerative disc and facet disease. SI joints symmetric. IMPRESSION: No acute bony abnormality. Electronically Signed   By: Rolm Baptise M.D.   On: 11/07/2021 21:25   CT HEAD WO CONTRAST (5MM)  Result Date: 11/07/2021 CLINICAL DATA:  75 year old male with altered mental status, confusion and head and neck pain. EXAM: CT HEAD WITHOUT CONTRAST CT CERVICAL SPINE WITHOUT CONTRAST TECHNIQUE: Multidetector CT imaging of the head and cervical spine was performed following the standard protocol without intravenous contrast. Multiplanar CT image reconstructions of the cervical spine were also generated. COMPARISON:  05/25/2005 brain MRI and 10/25/2004 CT cervical spine FINDINGS: CT HEAD FINDINGS Brain: No evidence of acute infarction, hemorrhage, hydrocephalus, extra-axial collection or mass lesion/mass effect. Remote bilateral occipital and RIGHT posterior parietal infarcts are noted. Mild chronic small vessel white matter ischemic changes are present. Vascular: Carotid and vertebral atherosclerotic calcifications are noted. Skull: Normal. Negative for fracture or focal lesion. Sinuses/Orbits: No acute finding. Other: None CT CERVICAL SPINE FINDINGS Alignment: Normal. Skull base and vertebrae: No acute fracture. No primary bone lesion or focal pathologic process. Soft tissues and spinal canal: No prevertebral fluid or swelling. No visible canal hematoma. Disc levels: Anterior fusion changes from C3-C5 noted. Moderate degenerative disc disease/spondylosis from C5-C7 noted. Mild-to-moderate multilevel facet arthropathy is identified. These changes contribute to mild  central spinal and mild to moderate biforaminal narrowing. Upper chest: Several LEFT UPPER lobe nodular opacities are noted, not completely imaged or characterized. Other: None IMPRESSION: 1. No evidence of acute intracranial abnormality. Remote bilateral occipital and RIGHT posterior parietal infarcts. Mild chronic small vessel white matter ischemic changes. 2. No static evidence of acute injury to the cervical spine. Degenerative and postsurgical changes as described. 3. LEFT UPPER lobe pulmonary nodular opacities, not completely imaged or characterized. Chest CT with contrast is recommended for further evaluation. Electronically Signed   By: Margarette Canada M.D.   On: 11/07/2021 21:12   CT CHEST WO CONTRAST  Result Date: 11/05/2021 CLINICAL DATA:  75 year old male with generalized pain. Abnormal left lung base on chest radiographs yesterday. UTI. EXAM: CT CHEST WITHOUT CONTRAST TECHNIQUE: Multidetector CT imaging of the chest was performed following the standard protocol without IV contrast. COMPARISON:  Chest radiographs 11/07/2021 and earlier. Chest CT 06/04/2018. FINDINGS: Cardiovascular: Cardiac size is increased since 2019 but there is no pericardial effusion. Calcified coronary artery and Calcified aortic atherosclerosis. Vascular patency is not evaluated in the absence of IV contrast. Mediastinum/Nodes: Small right paratracheal venous collaterals appear increased since 2019. Mediastinal lymph nodes remain within normal limits. Lungs/Pleura: Right lower lung chronic pleural thickening and calcification. Lower lung volumes compared to 2019. Major airways remain patent. There is extensive indistinct peribronchial nodularity throughout the superior segment of the left lower lobe, with consolidation elsewhere in that lower lobe. Similar new consolidation in the right lower lobe, and mild  right middle lobe nodularity superimposed on chronic scarring and architectural distortion there. There are also multiple  solid and sub solid indistinct bilateral new pulmonary nodules (series 3, image 39 in the left upper lobe). And 1 of the nodules in the right middle lobe shows evidence of early cavitation on series 3, image 106. In the right upper lobe some of the nodules are peripheral and wedge-shaped. No pleural effusion. Upper Abdomen: Negative visible noncontrast liver, spleen, pancreas, adrenal glands, and bowel in the upper abdomen. Musculoskeletal: Chronic severe degeneration at both shoulders and in the spine. Abnormal left chest wall with expanded left pectoralis muscle containing both numerous gas foci (series 2, image 56) and also possible intramuscular fluid. Reactive appearing left axillary lymph nodes. Conspicuous soft tissue gas also about the left sternoclavicular joint with asymmetric soft tissue swelling at the medial joint there also (coronal image 38). But no definite osteolysis. Superimposed small volume of intravenous gas at the thoracic inlet, likely from recent IV access. Multilevel thoracic spine ankylosis from flowing anterior endplate osteophytes, T2 through T12 and also T12-L1. Abnormal L1-L2 disc space including endplate spurring and small volume vacuum disc, but no convincing paraspinal soft tissue inflammation. IMPRESSION: 1. Suspect Left Pectoralis Abscess: abnormal left chest wall with expanded left pectoralis muscle containing some gas and probable intramuscular fluid. And consider Septic Left Sternoclavicular Joint: abnormal soft tissue swelling and punctate soft tissue gas also about the left sternoclavicular joint. No definite osteolysis to indicate osteomyelitis. 2. Superimposed bilateral lower lobe Pneumonia. And Consider Septic Emboli: with multiple indistinct bilateral pulmonary nodules elsewhere. 3. 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 No pleural effusion. 4. Cardiomegaly, new since 2019. Calcified coronary artery and Aortic  Atherosclerosis (ICD10-I70.0). Electronically Signed   By: Genevie Ann M.D.   On: 11/02/2021 05:29   CT Cervical Spine Wo Contrast  Result Date: 11/07/2021 CLINICAL DATA:  75 year old male with altered mental status, confusion and head and neck pain. EXAM: CT HEAD WITHOUT CONTRAST CT CERVICAL SPINE WITHOUT CONTRAST TECHNIQUE: Multidetector CT imaging of the head and cervical spine was performed following the standard protocol without intravenous contrast. Multiplanar CT image reconstructions of the cervical spine were also generated. COMPARISON:  05/25/2005 brain MRI and 10/25/2004 CT cervical spine FINDINGS: CT HEAD FINDINGS Brain: No evidence of acute infarction, hemorrhage, hydrocephalus, extra-axial collection or mass lesion/mass effect. Remote bilateral occipital and RIGHT posterior parietal infarcts are noted. Mild chronic small vessel white matter ischemic changes are present. Vascular: Carotid and vertebral atherosclerotic calcifications are noted. Skull: Normal. Negative for fracture or focal lesion. Sinuses/Orbits: No acute finding. Other: None CT CERVICAL SPINE FINDINGS Alignment: Normal. Skull base and vertebrae: No acute fracture. No primary bone lesion or focal pathologic process. Soft tissues and spinal canal: No prevertebral fluid or swelling. No visible canal hematoma. Disc levels: Anterior fusion changes from C3-C5 noted. Moderate degenerative disc disease/spondylosis from C5-C7 noted. Mild-to-moderate multilevel facet arthropathy is identified. These changes contribute to mild central spinal and mild to moderate biforaminal narrowing. Upper chest: Several LEFT UPPER lobe nodular opacities are noted, not completely imaged or characterized. Other: None IMPRESSION: 1. No evidence of acute intracranial abnormality. Remote bilateral occipital and RIGHT posterior parietal infarcts. Mild chronic small vessel white matter ischemic changes. 2. No static evidence of acute injury to the cervical spine.  Degenerative and postsurgical changes as described. 3. LEFT UPPER lobe pulmonary nodular opacities, not completely imaged or characterized. Chest CT with contrast is recommended for further evaluation. Electronically Signed  By: Margarette Canada M.D.   On: 11/07/2021 21:12   US RENAL  Result Date: 11/15/2021 CLINICAL DATA:  Acute kidney injury.  Left nephrectomy. EXAM: RENAL / URINARY TRACT ULTRASOUND COMPLETE COMPARISON:  None. FINDINGS: Right Kidney: Renal measurements: 13.8 x 7.6 x 7.1 cm = volume: 367 mL. Normal parenchymal echogenicity. No hydronephrosis. No visualized stone or focal lesion. Left Kidney: Surgically absent. Bladder: Appears normal for degree of bladder distention. Other: None. IMPRESSION: 1. Normal sonographic appearance of the right kidney. 2. Post remote left nephrectomy. Electronically Signed   By: Keith Rake M.D.   On: 10/27/2021 21:55   US Venous Img Lower Bilateral (DVT)  Result Date: 10/27/2021 CLINICAL DATA:  Lower extremity lymphedema and cellulitis. EXAM: BILATERAL LOWER EXTREMITY VENOUS DOPPLER ULTRASOUND TECHNIQUE: Gray-scale sonography with graded compression, as well as color Doppler and duplex ultrasound were performed to evaluate the lower extremity deep venous systems from the level of the common femoral vein and including the common femoral, femoral, profunda femoral, popliteal and calf veins including the posterior tibial, peroneal and gastrocnemius veins when visible. The superficial great saphenous vein was also interrogated. Spectral Doppler was utilized to evaluate flow at rest and with distal augmentation maneuvers in the common femoral, femoral and popliteal veins. COMPARISON:  Prior study on 05/27/2021 FINDINGS: RIGHT LOWER EXTREMITY Common Femoral Vein: No evidence of thrombus. Normal compressibility, respiratory phasicity and response to augmentation. Saphenofemoral Junction: No evidence of thrombus. Normal compressibility and flow on color Doppler  imaging. Profunda Femoral Vein: No evidence of thrombus. Normal compressibility and flow on color Doppler imaging. Femoral Vein: No evidence of thrombus. Normal compressibility, respiratory phasicity and response to augmentation. Popliteal Vein: No evidence of thrombus. Normal compressibility, respiratory phasicity and response to augmentation. Calf Veins: No evidence of thrombus. Normal compressibility and flow on color Doppler imaging. Superficial Great Saphenous Vein: No evidence of thrombus. Normal compressibility. Venous Reflux:  None. Other Findings: No evidence of superficial thrombophlebitis or abnormal fluid collection. LEFT LOWER EXTREMITY Common Femoral Vein: No evidence of thrombus. Normal compressibility, respiratory phasicity and response to augmentation. Saphenofemoral Junction: No evidence of thrombus. Normal compressibility and flow on color Doppler imaging. Profunda Femoral Vein: No evidence of thrombus. Normal compressibility and flow on color Doppler imaging. Femoral Vein: No evidence of thrombus. Normal compressibility, respiratory phasicity and response to augmentation. Popliteal Vein: No evidence of thrombus. Normal compressibility, respiratory phasicity and response to augmentation. Calf Veins: No evidence of thrombus. Normal compressibility and flow on color Doppler imaging. Superficial Great Saphenous Vein: No evidence of thrombus. Normal compressibility. Venous Reflux:  None. Other Findings: No evidence of superficial thrombophlebitis or abnormal fluid collection. IMPRESSION: No evidence of deep venous thrombosis in either lower extremity. Electronically Signed   By: Aletta Edouard M.D.   On: 11/14/2021 15:05   ASSESSMENT AND PLAN:    Victor Daniels is a 75 y.o. male with medical history significant for diabetes mellitus with complications of stage III chronic kidney disease, COPD with chronic respiratory failure on 2.5 L of oxygen continuous, chronic diastolic heart failure  with last known LVEF of 55% from 2021, history of CVA, chronic venous stasis ulceration and dermatitis who presents to the ER for the second time in 2 days with complaints of pain all over but mostly in his lower extremities. Patient noted to have bilateral lower extremity wounds (Left > right) with drainage and foul odor.  Sepsis secondary to M RSA pneumonia, left sternal clavicular joint septic arthritis, Left Pectoralis muscles infection/abscess skin infection  both lower extremity Possible Septic emboli -- presented with altered mental status, acute renal failure, elevated white count, tachycardia -- appreciate ID consultation with Dr. Steva Ready. Continue IV vancomycin -- monitor fever and white count -- patient will need TEE-- message sent to cardiology  bilateral lower extremity chronic venous stasis and edema with chronic wounds cellulitis Scrotal edema -- wound consult for dressing changes -- IV vancomycin should cover  Diabetes with stage III chronic kidney disease -- sliding scale insulin -- carb control diet  acute on chronic kidney disease stage III -- patient came in with creatinine of 4.28 in the setting of sepsis -- IV fluids--- creatinine down to 1.8 -- baseline creatinine 1.6  atrial fibrillation chronic -- continue Xarelto  COPD with chronic respiratory failure -- continue nebulizer and bronchodilator therapy  chronic diastolic dysfunction -Stable -Not acutely exacerbated --Hold torsemide, spironolactone, metolazone and Cozaar due to worsening renal function   Procedures: Family communication :none today Consults :ID CODE STATUS: FULL DVT Prophylaxis : Level of care: Progressive Status is: Inpatient  Remains inpatient appropriate because: M RSA sepsis rx        TOTAL TIME TAKING CARE OF THIS PATIENT: 35 minutes.  >50% time spent on counselling and coordination of care  Note: This dictation was prepared with Dragon dictation along with smaller  phrase technology. Any transcriptional errors that result from this process are unintentional.  Fritzi Mandes M.D    [facility]   CC: Primary care physician; Crecencio Mc, MD Patient ID: Victor Daniels, male   DOB: 06-Jul-1946, 75 y.o.   MRN: [id number]

## 2021-11-09 NOTE — Progress Notes (Signed)
Pharmacy Antibiotic Note  Victor Daniels is a 75 y.o. male admitted on 11/17/2021 with bacteremia,  cellulitis as suspected source. Pharmacy has been consulted for vancomycin dosing.   Presents with worsening diffuse pain after leaving AMA 2 days prior. PMH includes CAD, CHF (EF 25%), Stage 3 CKD, CVA and PAD. CT shows possible septic emboli and septic arthritis of left sternoclavicular joint/abscess of the pectoralis muscle. Blood cultures positive for MRSA.  Initially presenting with AKI, though Scr improved to 1.72 today. Vanc random lvl resulting @ 16 after vancomycin 1500 mg x 1 dose. ID plan has changed to continue vancomycin given improvement in Scr.  Plan: Vancomycin 1,500 mg every 24 hours Will reassess dose tomorrow after morning creatinine 11/17.  Monitor renal function closely, clinical course, LOT.   Weight: 117.1 kg (258 lb 2.5 oz)  No data recorded.  Recent Labs  Lab 11/06/21 1529 11/07/21 1854 11/07/21 1922 10/29/2021 0236 11/09/21 0721  WBC 20.5* 18.4*  --   --  18.6*  CREATININE 4.20*  --   --  2.51* 1.82*  LATICACIDVEN  --   --  1.5  --   --   VANCORANDOM  --   --   --   --  16     Estimated Creatinine Clearance: 46.3 mL/min (A) (by C-G formula based on SCr of 1.82 mg/dL (H)).    Allergies  Allergen Reactions   Clonidine Derivatives     Reaction unknown   Sulfa Antibiotics     Reaction unknown    Antimicrobials this admission: 11/15 vancomycin >>  11/15 Zosyn >>   Dose adjustments this admission:   Microbiology results: 11/14 BCx: MRSA 3/4  11/14 UCx: S. Aureus (80,000 CFU)   Thank you for allowing pharmacy to be a part of this patient's care.   Jaynie Bream, PharmD Pharmacy Resident  11/09/2021 8:21 AM

## 2021-11-09 NOTE — Progress Notes (Signed)
Date of Admission:  10/26/2021     ID: Victor Daniels is a 75 y.o. male  Principal Problem:   Bilateral pneumonia Active Problems:   COPD (chronic obstructive pulmonary disease) (HCC)   CAD (coronary artery disease)   CKD stage 3 due to type 2 diabetes mellitus (HCC)   Atrial fibrillation with RVR (HCC)   Cellulitis of left lower extremity   Chronic diastolic CHF (congestive heart failure) (HCC)   Acute worsening of stage 3 chronic kidney disease (HCC)   Septic arthritis of left sternoclavicular joint (HCC)    Subjective: Pt says he is feeling better Pain whole body and chest wall slightly better  Medications:   fluticasone furoate-vilanterol  1 puff Inhalation Daily   And   umeclidinium bromide  1 puff Inhalation Daily   insulin aspart  0-15 Units Subcutaneous TID WC   rivaroxaban  20 mg Oral Q supper   simvastatin  20 mg Oral QHS   vitamin B-12  1,000 mcg Oral Daily    Objective: Vital signs in last 24 hours: Pulse Rate:  [57-104] 89 (11/16 1500) Resp:  [13-20] 17 (11/16 1500) BP: (106-160)/(64-104) 128/76 (11/16 1500) SpO2:  [91 %-100 %] 100 % (11/16 1500)  PHYSICAL EXAM:  General: somnolent , but on calling his name he woke up and answered questions appropriately Head: Normocephalic, without obvious abnormality, atraumatic. ENT Nares normal. No drainage or sinus tenderness. Tongue coated Neck:  symmetrical, no adenopathy, thyroid: non tender  Lungs: b/l air entry Heart: s1s2 Left chest wall- erythematous circumscribed  swelling Abdomen: Soft, non-tender,not distended. Bowel sounds normal. No masses Extremities: b/l venous edema, superficial ulceration and bleeding left > rt Skin: brusing and superfical wounds forearms Lymph: Cervical, supraclavicular normal. Neurologic: Grossly non-focal  Lab Results Recent Labs    11/07/21 1854 10/28/2021 0236 11/09/21 0721  WBC 18.4*  --  18.6*  HGB 10.0*  --  9.6*  HCT 31.2*  --  29.9*  NA  --  133* 139   K  --  3.5 3.6  CL  --  89* 93*  CO2  --  34* 37*  BUN  --  103* 92*  CREATININE  --  2.51* 1.82*   Liver Panel Recent Labs    11/02/2021 0236  PROT 7.1  ALBUMIN 2.1*  AST 21  ALT 15  ALKPHOS 164*  BILITOT 2.3*   Microbiology: 11/14 - MRSA 3/4 Studies/Results: DG Chest 2 View  Result Date: 11/07/2021 CLINICAL DATA:  Weakness and confusion. EXAM: CHEST - 2 VIEW COMPARISON:  Chest radiograph dated 05/29/2021. FINDINGS: Bibasilar linear and streaky atelectasis/scarring. An area of increased density at the left lung base noted. Pneumonia is not excluded. Clinical correlation is recommended. There is blunting of the right costophrenic angle which may represent scarring or trace pleural effusion. No pneumothorax. Mild cardiomegaly. No acute osseous pathology. Degenerative changes of the spine. IMPRESSION: 1. Left lung base atelectasis versus infiltrate. 2. Mild cardiomegaly. Electronically Signed   By: Anner Crete M.D.   On: 11/07/2021 21:25   DG Thoracic Spine 2 View  Result Date: 11/07/2021 CLINICAL DATA:  Fall, back pain EXAM: THORACIC SPINE 2 VIEWS COMPARISON:  None. FINDINGS: Normal alignment. No fracture. Flowing anterior osteophytes. No focal bone lesion. IMPRESSION: No acute bony abnormality. Electronically Signed   By: Rolm Baptise M.D.   On: 11/07/2021 21:26   DG Lumbar Spine 2-3 Views  Result Date: 11/07/2021 CLINICAL DATA:  Fall, back pain EXAM: LUMBAR SPINE - 2-3 VIEW COMPARISON:  None. FINDINGS: Normal alignment. No fracture. Diffuse degenerative disc and facet disease. SI joints symmetric. IMPRESSION: No acute bony abnormality. Electronically Signed   By: Charlett Nose M.D.   On: 11/07/2021 21:25   CT HEAD WO CONTRAST ( )  Result Date: 11/07/2021 CLINICAL DATA:  75 year old male with altered mental status, confusion and head and neck pain. EXAM: CT HEAD WITHOUT CONTRAST CT CERVICAL SPINE WITHOUT CONTRAST TECHNIQUE: Multidetector CT imaging of the head and cervical  spine was performed following the standard protocol without intravenous contrast. Multiplanar CT image reconstructions of the cervical spine were also generated. COMPARISON:  05/25/2005 brain MRI and 10/25/2004 CT cervical spine FINDINGS: CT HEAD FINDINGS Brain: No evidence of acute infarction, hemorrhage, hydrocephalus, extra-axial collection or mass lesion/mass effect. Remote bilateral occipital and RIGHT posterior parietal infarcts are noted. Mild chronic small vessel white matter ischemic changes are present. Vascular: Carotid and vertebral atherosclerotic calcifications are noted. Skull: Normal. Negative for fracture or focal lesion. Sinuses/Orbits: No acute finding. Other: None CT CERVICAL SPINE FINDINGS Alignment: Normal. Skull base and vertebrae: No acute fracture. No primary bone lesion or focal pathologic process. Soft tissues and spinal canal: No prevertebral fluid or swelling. No visible canal hematoma. Disc levels: Anterior fusion changes from C3-C5 noted. Moderate degenerative disc disease/spondylosis from C5-C7 noted. Mild-to-moderate multilevel facet arthropathy is identified. These changes contribute to mild central spinal and mild to moderate biforaminal narrowing. Upper chest: Several LEFT UPPER lobe nodular opacities are noted, not completely imaged or characterized. Other: None IMPRESSION: 1. No evidence of acute intracranial abnormality. Remote bilateral occipital and RIGHT posterior parietal infarcts. Mild chronic small vessel white matter ischemic changes. 2. No static evidence of acute injury to the cervical spine. Degenerative and postsurgical changes as described. 3. LEFT UPPER lobe pulmonary nodular opacities, not completely imaged or characterized. Chest CT with contrast is recommended for further evaluation. Electronically Signed   By: Harmon Pier M.D.   On: 11/07/2021 21:12   CT CHEST WO CONTRAST  Result Date: 10/28/2021 CLINICAL DATA:  75 year old male with generalized pain.  Abnormal left lung base on chest radiographs yesterday. UTI. EXAM: CT CHEST WITHOUT CONTRAST TECHNIQUE: Multidetector CT imaging of the chest was performed following the standard protocol without IV contrast. COMPARISON:  Chest radiographs 11/07/2021 and earlier. Chest CT 06/04/2018. FINDINGS: Cardiovascular: Cardiac size is increased since 2019 but there is no pericardial effusion. Calcified coronary artery and Calcified aortic atherosclerosis. Vascular patency is not evaluated in the absence of IV contrast. Mediastinum/Nodes: Small right paratracheal venous collaterals appear increased since 2019. Mediastinal lymph nodes remain within normal limits. Lungs/Pleura: Right lower lung chronic pleural thickening and calcification. Lower lung volumes compared to 2019. Major airways remain patent. There is extensive indistinct peribronchial nodularity throughout the superior segment of the left lower lobe, with consolidation elsewhere in that lower lobe. Similar new consolidation in the right lower lobe, and mild right middle lobe nodularity superimposed on chronic scarring and architectural distortion there. There are also multiple solid and sub solid indistinct bilateral new pulmonary nodules (series 3, image 39 in the left upper lobe). And 1 of the nodules in the right middle lobe shows evidence of early cavitation on series 3, image 106. In the right upper lobe some of the nodules are peripheral and wedge-shaped. No pleural effusion. Upper Abdomen: Negative visible noncontrast liver, spleen, pancreas, adrenal glands, and bowel in the upper abdomen. Musculoskeletal: Chronic severe degeneration at both shoulders and in the spine. Abnormal left chest wall with expanded left pectoralis muscle  containing both numerous gas foci (series 2, image 56) and also possible intramuscular fluid. Reactive appearing left axillary lymph nodes. Conspicuous soft tissue gas also about the left sternoclavicular joint with asymmetric soft  tissue swelling at the medial joint there also (coronal image 38). But no definite osteolysis. Superimposed small volume of intravenous gas at the thoracic inlet, likely from recent IV access. Multilevel thoracic spine ankylosis from flowing anterior endplate osteophytes, T2 through T12 and also T12-L1. Abnormal L1-L2 disc space including endplate spurring and small volume vacuum disc, but no convincing paraspinal soft tissue inflammation. IMPRESSION: 1. Suspect Left Pectoralis Abscess: abnormal left chest wall with expanded left pectoralis muscle containing some gas and probable intramuscular fluid. And consider Septic Left Sternoclavicular Joint: abnormal soft tissue swelling and punctate soft tissue gas also about the left sternoclavicular joint. No definite osteolysis to indicate osteomyelitis. 2. Superimposed bilateral lower lobe Pneumonia. And Consider Septic Emboli: with multiple indistinct bilateral pulmonary nodules elsewhere. 3. 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 No pleural effusion. 4. Cardiomegaly, new since 2019. Calcified coronary artery and Aortic Atherosclerosis (ICD10-I70.0). Electronically Signed   By: Genevie Ann M.D.   On: 10/29/2021 05:29   CT Cervical Spine Wo Contrast  Result Date: 11/07/2021 CLINICAL DATA:  75 year old male with altered mental status, confusion and head and neck pain. EXAM: CT HEAD WITHOUT CONTRAST CT CERVICAL SPINE WITHOUT CONTRAST TECHNIQUE: Multidetector CT imaging of the head and cervical spine was performed following the standard protocol without intravenous contrast. Multiplanar CT image reconstructions of the cervical spine were also generated. COMPARISON:  05/25/2005 brain MRI and 10/25/2004 CT cervical spine FINDINGS: CT HEAD FINDINGS Brain: No evidence of acute infarction, hemorrhage, hydrocephalus, extra-axial collection or mass lesion/mass effect. Remote bilateral occipital and RIGHT posterior parietal  infarcts are noted. Mild chronic small vessel white matter ischemic changes are present. Vascular: Carotid and vertebral atherosclerotic calcifications are noted. Skull: Normal. Negative for fracture or focal lesion. Sinuses/Orbits: No acute finding. Other: None CT CERVICAL SPINE FINDINGS Alignment: Normal. Skull base and vertebrae: No acute fracture. No primary bone lesion or focal pathologic process. Soft tissues and spinal canal: No prevertebral fluid or swelling. No visible canal hematoma. Disc levels: Anterior fusion changes from C3-C5 noted. Moderate degenerative disc disease/spondylosis from C5-C7 noted. Mild-to-moderate multilevel facet arthropathy is identified. These changes contribute to mild central spinal and mild to moderate biforaminal narrowing. Upper chest: Several LEFT UPPER lobe nodular opacities are noted, not completely imaged or characterized. Other: None IMPRESSION: 1. No evidence of acute intracranial abnormality. Remote bilateral occipital and RIGHT posterior parietal infarcts. Mild chronic small vessel white matter ischemic changes. 2. No static evidence of acute injury to the cervical spine. Degenerative and postsurgical changes as described. 3. LEFT UPPER lobe pulmonary nodular opacities, not completely imaged or characterized. Chest CT with contrast is recommended for further evaluation. Electronically Signed   By: Margarette Canada M.D.   On: 11/07/2021 21:12   US RENAL  Result Date: 11/15/2021 CLINICAL DATA:  Acute kidney injury.  Left nephrectomy. EXAM: RENAL / URINARY TRACT ULTRASOUND COMPLETE COMPARISON:  None. FINDINGS: Right Kidney: Renal measurements: 13.8 x 7.6 x 7.1 cm = volume: 367 mL. Normal parenchymal echogenicity. No hydronephrosis. No visualized stone or focal lesion. Left Kidney: Surgically absent. Bladder: Appears normal for degree of bladder distention. Other: None. IMPRESSION: 1. Normal sonographic appearance of the right kidney. 2. Post remote left nephrectomy.  Electronically Signed   By: Keith Rake M.D.   On: 11/10/2021 21:55  US Venous Img Lower Bilateral (DVT)  Result Date: 11/01/2021 CLINICAL DATA:  Lower extremity lymphedema and cellulitis. EXAM: BILATERAL LOWER EXTREMITY VENOUS DOPPLER ULTRASOUND TECHNIQUE: Gray-scale sonography with graded compression, as well as color Doppler and duplex ultrasound were performed to evaluate the lower extremity deep venous systems from the level of the common femoral vein and including the common femoral, femoral, profunda femoral, popliteal and calf veins including the posterior tibial, peroneal and gastrocnemius veins when visible. The superficial great saphenous vein was also interrogated. Spectral Doppler was utilized to evaluate flow at rest and with distal augmentation maneuvers in the common femoral, femoral and popliteal veins. COMPARISON:  Prior study on 05/27/2021 FINDINGS: RIGHT LOWER EXTREMITY Common Femoral Vein: No evidence of thrombus. Normal compressibility, respiratory phasicity and response to augmentation. Saphenofemoral Junction: No evidence of thrombus. Normal compressibility and flow on color Doppler imaging. Profunda Femoral Vein: No evidence of thrombus. Normal compressibility and flow on color Doppler imaging. Femoral Vein: No evidence of thrombus. Normal compressibility, respiratory phasicity and response to augmentation. Popliteal Vein: No evidence of thrombus. Normal compressibility, respiratory phasicity and response to augmentation. Calf Veins: No evidence of thrombus. Normal compressibility and flow on color Doppler imaging. Superficial Great Saphenous Vein: No evidence of thrombus. Normal compressibility. Venous Reflux:  None. Other Findings: No evidence of superficial thrombophlebitis or abnormal fluid collection. LEFT LOWER EXTREMITY Common Femoral Vein: No evidence of thrombus. Normal compressibility, respiratory phasicity and response to augmentation. Saphenofemoral Junction: No  evidence of thrombus. Normal compressibility and flow on color Doppler imaging. Profunda Femoral Vein: No evidence of thrombus. Normal compressibility and flow on color Doppler imaging. Femoral Vein: No evidence of thrombus. Normal compressibility, respiratory phasicity and response to augmentation. Popliteal Vein: No evidence of thrombus. Normal compressibility, respiratory phasicity and response to augmentation. Calf Veins: No evidence of thrombus. Normal compressibility and flow on color Doppler imaging. Superficial Great Saphenous Vein: No evidence of thrombus. Normal compressibility. Venous Reflux:  None. Other Findings: No evidence of superficial thrombophlebitis or abnormal fluid collection. IMPRESSION: No evidence of deep venous thrombosis in either lower extremity. Electronically Signed   By: Aletta Edouard M.D.   On: 11/23/2021 15:05   ECHOCARDIOGRAM COMPLETE  Result Date: 11/09/2021    ECHOCARDIOGRAM REPORT   Patient Name:   Victor Daniels Date of Exam: 10/25/2021 Medical Rec #:  [id number]             Height:       72.0 in Accession #:    [id number]            Weight:       258.2 lb Date of Birth:  [birth year]            BSA:          2.375 m Patient Age:    71 years              BP:           108/66 mmHg Patient Gender: M                     HR:           89 bpm. Exam Location:  [facility] Procedure: 2D Echo, Cardiac Doppler, Color Doppler and Intracardiac            Opacification Agent  MODIFIED REPORT:      This report was modified by Yolonda Kida MD on 11/09/2021 due to                                 Mod/Severe TR.  Indications:     I51.7 Cardiomegaly  History:         Patient has prior history of Echocardiogram examinations, most                  recent 06/22/2020. CHF, Previous Myocardial Infarction and CAD,                  COPD and Stroke; Risk Factors:Diabetes, Dyslipidemia,                  Hypertension and Sleep Apnea. Pneumonia.  Sonographer:      Cresenciano Lick RDCS Referring Phys:  [id number] Diagnosing Phys: Yolonda Kida MD IMPRESSIONS  1. Left ventricular ejection fraction, by estimation, is 60 to 65%. The left ventricle has normal function. The left ventricle has no regional wall motion abnormalities. Left ventricular diastolic parameters were normal.  2. Right ventricular systolic function is normal. The right ventricular size is normal.  3. The mitral valve is normal in structure. No evidence of mitral valve regurgitation.  4. Tricuspid valve regurgitation is moderate to severe.  5. The aortic valve is normal in structure. Aortic valve regurgitation is not visualized. FINDINGS  Left Ventricle: Left ventricular ejection fraction, by estimation, is 60 to 65%. The left ventricle has normal function. The left ventricle has no regional wall motion abnormalities. Definity contrast agent was given IV to delineate the left ventricular  endocardial borders. The left ventricular internal cavity size was normal in size. There is borderline concentric left ventricular hypertrophy. Left ventricular diastolic parameters were normal. Right Ventricle: The right ventricular size is normal. No increase in right ventricular wall thickness. Right ventricular systolic function is normal. Left Atrium: Left atrial size was normal in size. Right Atrium: Right atrial size was normal in size. Pericardium: There is no evidence of pericardial effusion. Mitral Valve: The mitral valve is normal in structure. No evidence of mitral valve regurgitation. Tricuspid Valve: The tricuspid valve is normal in structure. Tricuspid valve regurgitation is moderate to severe. Aortic Valve: The aortic valve is normal in structure. Aortic valve regurgitation is not visualized. Pulmonic Valve: The pulmonic valve was normal in structure. Pulmonic valve regurgitation is not visualized. Aorta: The ascending aorta was not well visualized. IAS/Shunts: No atrial level shunt  detected by color flow Doppler.  LEFT VENTRICLE PLAX 2D LVIDd:         4.30 cm LVIDs:         2.80 cm LV PW:         1.10 cm LV IVS:        1.10 cm LVOT diam:     2.20 cm LV SV:         69 LV SV Index:   29 LVOT Area:     3.80 cm  RIGHT VENTRICLE             IVC RV S prime:     13.40 cm/s  IVC diam: 2.70 cm TAPSE (M-mode): 1.4 cm LEFT ATRIUM           Index        RIGHT ATRIUM  Index LA diam:      4.40 cm 1.85 cm/m   RA Area:     26.10 cm LA Vol (A2C): 74.9 ml 31.54 ml/m  RA Volume:   85.20 ml  35.88 ml/m LA Vol (A4C): 94.6 ml 39.84 ml/m  AORTIC VALVE LVOT Vmax:   97.90 cm/s LVOT Vmean:  71.767 cm/s LVOT VTI:    0.181 m  AORTA Ao Root diam: 3.00 cm Ao Asc diam:  3.90 cm MV E velocity: 107.00 cm/s  TRICUSPID VALVE                             TR Peak grad:   34.3 mmHg                             TR Vmax:        293.00 cm/s                              SHUNTS                             Systemic VTI:  0.18 m                             Systemic Diam: 2.20 cm Dwayne Prince Rome MD Electronically signed by Yolonda Kida MD Signature Date/Time: 11/09/2021/1:25:51 PM    Final (Updated)      Assessment/Plan: Acute encephalopathy.  Due to infection and AKI Possibly oxycodone is playing a role as well.   MRSA bacteremia currently on vancomycin. AKI improving continue vanco.  Need to rule out endocarditis.  We will get TEE.  Repeat blood cultures sent  Left sternoclavicular joint septic arthritis with left pectoralis muscle abscess Patient needs drainage of the pectoralis muscle abscess- IR will do tomorrow   Bilateral infiltrate in the lungs left more than right very likely septic emboli.  AKI on CKD- improving Patient has had left nephrectomy  Renal ultrasound shows N rt kidney postvoid bladder scan.    Discussed the management with the patient and care team

## 2021-11-09 NOTE — TOC Initial Note (Signed)
Transition of Care [facility]) - Initial/Assessment Note    Patient Details  Name: Victor Daniels MRN: [id number] Date of Birth: Apr 25, 1946  Transition of Care [facility]) CM/SW Contact:    Ova Freshwater Phone Number: [phone] 11/09/2021, 2:51 PM  Clinical Narrative:                  Patient presents to [facility] due to altered mental status.  Patient uses O2 2-3L chronically.  Patient states he does not want his sister Ozella Almond to make decisions for him or updated.  Patient stated his HPOA is Conley Rolls (friend/HPOA) [phone].  Patient's wife died recently. Patient is able to complete most ADLs without assistance. DSS has been contacted by patient's neighbors.  Patient tired and not abel to follow conversation consistently. TOC will continue to follow. .  Expected Discharge Plan: Skilled Nursing Facility Barriers to Discharge: Continued Medical Work up   Patient Goals and CMS Choice Patient states their goals for this hospitalization and ongoing recovery are:: To get back home      Expected Discharge Plan and Services Expected Discharge Plan: [place] In-house Referral: Clinical Social Work   Post Acute Care Choice: [place] Living arrangements for the past 2 months: [place]                                      Prior Living Arrangements/Services Living arrangements for the past 2 months: Single Family Home Lives with:: Self Patient language and need for interpreter reviewed:: Yes Do you feel safe going back to the place where you live?: Yes      Need for Family Participation in Patient Care: Yes (Comment) Care giver support system in place?: Yes (comment)   Criminal Activity/Legal Involvement Pertinent to Current Situation/Hospitalization: No - Comment as needed  Activities of Daily Living      Permission Sought/Granted Permission sought to share information with : Other (comment) (HPOA) Permission  granted to share information with : Yes, Verbal Permission Granted  Share Information with NAME: morgan,peggy Friend     [phone]           Emotional Assessment Appearance:: Appears stated age Attitude/Demeanor/Rapport: Engaged Affect (typically observed): Stable Orientation: : Oriented to Self, Oriented to Place, Oriented to  Time, Oriented to Situation Alcohol / Substance Use: Not Applicable Psych Involvement: No (comment)  Admission diagnosis:  Bilateral pneumonia [J18.9] Patient Active Problem List   Diagnosis Date Noted   Bilateral pneumonia 11/09/2021   Acute worsening of stage 3 chronic kidney disease ([place]) 11/19/2021   Septic arthritis of left sternoclavicular joint ([place]) 10/27/2021   Anasarca 10/27/2021   Bilateral leg edema 10/24/2021   Accidental drug overdose 06/14/2021   Cellulitis of left lower extremity 05/29/2021   Wound infection 05/29/2021   Chronic diastolic CHF (congestive heart failure) ([place]) 05/29/2021   Hypokalemia 05/29/2021   Lymphedema 05/26/2021   Abnormal gait 05/26/2021   Insomnia due to psychological stress 03/26/2021   Failure to thrive syndrome, adult 03/23/2021   DVT (deep venous thrombosis) (HCC)    Leukocytosis    Hematuria, gross 07/19/2020   Acquired thrombophilia ([place]) 07/04/2020   Atrial fibrillation with RVR ([place])    CKD stage 3 due to type 2 diabetes mellitus ([place]) 03/30/2020   Degenerative joint disease of shoulder region 09/26/2018   Obstructive sleep apnea 11/22/2016   Hypogonadism male 07/07/2016   Obesity  06/03/2016   Reduced libido 05/11/2016   Visit for preventive health examination 03/01/2016   Diabetes mellitus with circulatory complication (HCC) 03/01/2016   Shoulder pain, left 08/03/2015   Pneumonia 04/13/2015   Cervical radiculopathy due to degenerative joint disease of spine 03/24/2015   Iron deficiency anemia 02/25/2015   Generalized weakness 02/25/2015   Nocturia 02/25/2015   Pernicious anemia 01/14/2015    S/P hip replacement 12/12/2014   S/p nephrectomy 09/07/2014   Vertigo, central 08/20/2014   Senile purpura (HCC) 06/28/2014   Incomplete emptying of bladder 10/08/2013   Anemia 09/11/2013   Chest pain with high risk for cardiac etiology 04/08/2013   Benign localized prostatic hyperplasia with lower urinary tract symptoms (LUTS) 10/23/2012   Sciatica 10/23/2012   Venous insufficiency (chronic) (peripheral) 07/08/2012   Venous insufficiency    History of alcohol abuse    Vasomotor rhinitis 12/06/2011   CAD (coronary artery disease) 10/12/2011   COPD (chronic obstructive pulmonary disease) (HCC)    CHF with left ventricular diastolic dysfunction, NYHA class 2 (HCC)    Hyperlipidemia    Hypertension    PCP:  [name], MD Pharmacy:   [facility] ORDER) ELECTRONIC [place], [place] - [address] [place] [address] Phone: [phone] Fax: [phone]  [facility] - [place], [place] - [address] [place] [address] Phone: [phone] Fax: [phone]  [facility] [address]), [place] - [address] [place] [address] Phone: [phone] Fax: [phone]  [facility] [address] [place] - [address] [facility] [address] [place] [address] Phone: [phone] Fax: [phone]     Social Determinants of Health (SDOH) Interventions    Readmission Risk Interventions No flowsheet data found.

## 2021-11-09 NOTE — Progress Notes (Signed)
Patient incontinent of urine. Brief changed.

## 2021-11-09 NOTE — Progress Notes (Signed)
Episode of incontinence. Patient changed and repositioned.

## 2021-11-10 ENCOUNTER — Inpatient Hospital Stay (HOSPITAL_COMMUNITY)
Admit: 2021-11-10 | Discharge: 2021-11-10 | Disposition: A | Discharge status: Home / Self Care | Payer: Medicare HMO | Attending: Cardiovascular Disease | Admitting: Cardiovascular Disease

## 2021-11-10 ENCOUNTER — Encounter: Admission: EM | Disposition: E | Payer: Self-pay | Source: Home / Self Care | Attending: Internal Medicine

## 2021-11-10 ENCOUNTER — Encounter: Payer: Self-pay | Admitting: Cardiovascular Disease

## 2021-11-10 ENCOUNTER — Inpatient Hospital Stay: Payer: Medicare HMO | Admitting: Radiology

## 2021-11-10 DIAGNOSIS — R7881 Bacteremia: Secondary | ICD-10-CM

## 2021-11-10 DIAGNOSIS — I340 Nonrheumatic mitral (valve) insufficiency: Secondary | ICD-10-CM | POA: Diagnosis not present

## 2021-11-10 DIAGNOSIS — B9562 Methicillin resistant Staphylococcus aureus infection as the cause of diseases classified elsewhere: Secondary | ICD-10-CM | POA: Diagnosis not present

## 2021-11-10 DIAGNOSIS — I371 Nonrheumatic pulmonary valve insufficiency: Secondary | ICD-10-CM | POA: Diagnosis not present

## 2021-11-10 DIAGNOSIS — L03116 Cellulitis of left lower limb: Secondary | ICD-10-CM

## 2021-11-10 DIAGNOSIS — I4891 Unspecified atrial fibrillation: Secondary | ICD-10-CM | POA: Diagnosis not present

## 2021-11-10 DIAGNOSIS — I361 Nonrheumatic tricuspid (valve) insufficiency: Secondary | ICD-10-CM | POA: Diagnosis not present

## 2021-11-10 DIAGNOSIS — N183 Chronic kidney disease, stage 3 unspecified: Secondary | ICD-10-CM | POA: Diagnosis not present

## 2021-11-10 HISTORY — PX: TEE WITHOUT CARDIOVERSION: SHX5443

## 2021-11-10 HISTORY — PX: IR US GUIDE BX ASP/DRAIN: IMG2392

## 2021-11-10 LAB — GLUCOSE, CAPILLARY
Glucose-Capillary: 135 mg/dL — ABNORMAL HIGH (ref 70–99)
Glucose-Capillary: 125 mg/dL — ABNORMAL HIGH (ref 70–99)
Glucose-Capillary: 120 mg/dL — ABNORMAL HIGH (ref 70–99)
Glucose-Capillary: 122 mg/dL — ABNORMAL HIGH (ref 70–99)

## 2021-11-10 LAB — URINE CULTURE: Culture: 80000 — AB

## 2021-11-10 LAB — CREATININE, SERUM
Creatinine, Ser: 1.63 mg/dL — ABNORMAL HIGH (ref 0.61–1.24)
GFR, Estimated: 44 mL/min — ABNORMAL LOW (ref >60)

## 2021-11-10 SURGERY — ECHOCARDIOGRAM, TRANSESOPHAGEAL
Anesthesia: Moderate Sedation

## 2021-11-10 MED ORDER — SODIUM CHLORIDE 0.9 % IV SOLN
8.0000 mg/kg | Freq: Every day | INTRAVENOUS | Status: DC
  Administered 2021-11-11 – 2021-11-13 (×3): 750 mg via INTRAVENOUS
  Filled 2021-11-10 (×4): qty 15

## 2021-11-10 MED ORDER — LIDOCAINE HCL 1 % IJ SOLN
INTRAMUSCULAR | Status: AC
  Filled 2021-11-10: qty 20

## 2021-11-10 MED ORDER — SODIUM CHLORIDE FLUSH 0.9 % IV SOLN
INTRAVENOUS | Status: AC
  Filled 2021-11-10: qty 10

## 2021-11-10 MED ORDER — FENTANYL CITRATE (PF) 100 MCG/2ML IJ SOLN
INTRAMUSCULAR | Status: AC
  Filled 2021-11-10: qty 2

## 2021-11-10 MED ORDER — MIDAZOLAM HCL 2 MG/2ML IJ SOLN
INTRAMUSCULAR | Status: AC
  Filled 2021-11-10: qty 4

## 2021-11-10 MED ORDER — MIDAZOLAM HCL 2 MG/2ML IJ SOLN
INTRAMUSCULAR | Status: AC
  Filled 2021-11-10: qty 2

## 2021-11-10 MED ORDER — MIDAZOLAM HCL 2 MG/2ML IJ SOLN
INTRAMUSCULAR | Status: AC | PRN
  Administered 2021-11-10: 2 mg via INTRAVENOUS
  Administered 2021-11-10: 1 mg via INTRAVENOUS

## 2021-11-10 MED ORDER — FENTANYL CITRATE (PF) 100 MCG/2ML IJ SOLN
INTRAMUSCULAR | Status: AC | PRN
  Administered 2021-11-10 (×3): 50 ug via INTRAVENOUS

## 2021-11-10 MED ORDER — BUTAMBEN-TETRACAINE-BENZOCAINE 2-2-14 % EX AERO
INHALATION_SPRAY | CUTANEOUS | Status: AC
  Filled 2021-11-10: qty 5

## 2021-11-10 MED ORDER — LIDOCAINE VISCOUS HCL 2 % MT SOLN
OROMUCOSAL | Status: AC | PRN
  Administered 2021-11-10: 5 mL via OROMUCOSAL

## 2021-11-10 MED ORDER — LIDOCAINE VISCOUS HCL 2 % MT SOLN
OROMUCOSAL | Status: AC
  Filled 2021-11-10: qty 15

## 2021-11-10 MED ORDER — MIDAZOLAM HCL 5 MG/5ML IJ SOLN
INTRAMUSCULAR | Status: AC | PRN
  Administered 2021-11-10: 1 mg via INTRAVENOUS

## 2021-11-10 MED ORDER — RIVAROXABAN 20 MG PO TABS
20.0000 mg | ORAL_TABLET | Freq: Every day | ORAL | Status: DC
  Administered 2021-11-11 – 2021-11-13 (×3): 20 mg via ORAL
  Filled 2021-11-10 (×3): qty 1

## 2021-11-10 MED ORDER — SODIUM CHLORIDE 0.9 % IV SOLN: INTRAVENOUS | Status: DC

## 2021-11-10 NOTE — Progress Notes (Signed)
*  PRELIMINARY RESULTS* Echocardiogram Echocardiogram Transesophageal has been performed.  Joanette Gula Matilde Pottenger 11/03/2021, 12:04 PM

## 2021-11-10 NOTE — Sedation Documentation (Signed)
Tee PROBE INSERTED

## 2021-11-10 NOTE — Progress Notes (Signed)
SLP Cancellation Note  Patient Details Name: RIVAN SIORDIA MRN: [id number] DOB: Sep 11, 1946   Cancelled treatment:       Reason Eval/Treat Not Completed: Patient at procedure or test/unavailable (pt was NPO or off the floor at procedures most of the day. Will f/u tomorrow morning. NSG consulted.)     Jerilynn Som, MS, CCC-SLP Speech Language Pathologist Rehab Services [phone] [facility] 10/30/2021, 5:20 PM

## 2021-11-10 NOTE — Procedures (Signed)
Interventional Radiology Procedure:   Indications: Left chest abscess  Procedure: US guided aspiration of left anterior chest fluid and gas collections  Findings: Small amount of thick bloody fluid obtained from small collections in left medial anterior chest.  Multiple areas were aspirated.  No significant fluid aspirated around the small gas collections in lateral chest.  6 ml of fluid collected.  Complications: No immediate complications noted.     EBL: Minimal  Plan: Send fluid for culture   Ryanne Morand R. Lowella Dandy, MD  Pager: [phone]

## 2021-11-10 NOTE — Progress Notes (Signed)
Transesophageal Echocardiogram :  Indication: bacteremia, endocarditis, atrial fibrillation Requesting/ordering  physician: sona patel  Procedure: Benzocaine spray x2 and 2 mls x 2 of viscous lidocaine were given orally to provide local anesthesia to the oropharynx. The patient was positioned supine on the left side, bite block provided. The patient was moderately sedated with the doses of versed and fentanyl as detailed below.  Using digital technique an omniplane probe was advanced into the distal esophagus without incident.   Moderate sedation: 1. Sedation used:  Versed:4 mg, Fentanyl: 150 ug 2. Time administered: 11:20 AM    Time when patient started recovery:12:10 pm Total sedation time 50 min 3. I was face to face during this time  See report in EPIC  for complete details: In brief,  No valve vegetation noted. Moderate TR with moderately elevated RVSP estimated at 50 mm Hg Transgastric imaging revealed normal LV function with no RWMAs and no mural apical thrombus.  .  Estimated ejection fraction was 55%.  Right sided cardiac chambers were normal   Imaging of the septum showed no ASD or VSD Bubble study was positive for shunt 2D and color flow confirmed PFO  The LA was well visualized in orthogonal views.  There was no spontaneous contrast and no thrombus in the LA and LA appendage   The descending thoracic aorta had no  mural aortic debris with no evidence of aneurysmal dilation or dissection. Mild atheroma noted.   Victor Daniels 11/12/2021 12:21 PM

## 2021-11-10 NOTE — Progress Notes (Signed)
[facility] [place] NAME: Victor Daniels    MR#:  [id number]  DATE OF BIRTH:  November 13, 1946  SUBJECTIVE:   patient keeps his eyes closed. He what he is thirsty wants water. Explain to him regarding TEE and ultrasound guided drainage of left sternal clavicle/pectoral muscle abscess. No fever.  REVIEW OF SYSTEMS:   Review of Systems  Constitutional:  Negative for chills, fever and weight loss.  HENT:  Negative for ear discharge, ear pain and nosebleeds.   Eyes:  Negative for blurred vision, pain and discharge.  Respiratory:  Positive for shortness of breath. Negative for sputum production, wheezing and stridor.   Cardiovascular:  Negative for chest pain, palpitations, orthopnea and PND.  Gastrointestinal:  Negative for abdominal pain, diarrhea, nausea and vomiting.  Genitourinary:  Negative for frequency and urgency.  Musculoskeletal:  Positive for back pain and joint pain.  Neurological:  Negative for sensory change, speech change, focal weakness and weakness.  Psychiatric/Behavioral:  Negative for depression and hallucinations. The patient is not nervous/anxious.   Tolerating Diet: NPO Tolerating PT: rehab  DRUG ALLERGIES:   Allergies  Allergen Reactions   Clonidine Derivatives     Reaction unknown   Sulfa Antibiotics     Reaction unknown    VITALS:  Blood pressure 124/75, pulse 97, temperature 98.2 F (36.8 C), resp. rate 18, height 6' (1.829 m), weight 117.4 kg, SpO2 91 %.  PHYSICAL EXAMINATION:   Physical Exam  GENERAL:  75 y.o.-year-old patient lying in the bed with no acute distress. Disheveled, poor hygiene HEENT: Head atraumatic, normocephalic. Oropharynx and nasopharynx clear.  LUNGS: decreased breath sounds bilaterally, no wheezing, rales, rhonchi. No use of accessory muscles of respiration.  CARDIOVASCULAR: S1, S2 normal. No murmurs, rubs, or gallops. tachycardia ABDOMEN: Soft, nontender, nondistended. Bowel  sounds present. No organomegaly or mass.  EXTREMITIES: Left LE with circumferential weeping and ulcerations that are partial thickness and almost linear in appearance(covers entire LLE calf and pretibial region Right LE; scattered areas of eschar/scabbing also with some linear in appearance areas. Not truly opened wounds.  May have some excoriation as well on this RLE which is common with venous dermatitis  NEUROLOGIC: nonfocal PSYCHIATRIC:  patient is alert SKIN:as above  LABORATORY PANEL:  CBC Recent Labs  Lab 11/09/21 0721  WBC 18.6*  HGB 9.6*  HCT 29.9*  PLT 122*     Chemistries  Recent Labs  Lab 11/05/2021 0236 11/09/21 0721 11/11/2021 0536  NA 133* 139  --   K 3.5 3.6  --   CL 89* 93*  --   CO2 34* 37*  --   GLUCOSE 152* 136*  --   BUN 103* 92*  --   CREATININE 2.51* 1.82* 1.63*  CALCIUM 8.5* 8.7*  --   MG 2.0  --   --   AST 21  --   --   ALT 15  --   --   ALKPHOS 164*  --   --   BILITOT 2.3*  --   --     Cardiac Enzymes No results for input(s): TROPONINI in the last 168 hours. RADIOLOGY:  US RENAL  Result Date: 11/05/2021 CLINICAL DATA:  Acute kidney injury.  Left nephrectomy. EXAM: RENAL / URINARY TRACT ULTRASOUND COMPLETE COMPARISON:  None. FINDINGS: Right Kidney: Renal measurements: 13.8 x 7.6 x 7.1 cm = volume: 367 mL. Normal parenchymal echogenicity. No hydronephrosis. No visualized stone or focal lesion. Left Kidney: Surgically absent. Bladder: Appears normal  for degree of bladder distention. Other: None. IMPRESSION: 1. Normal sonographic appearance of the right kidney. 2. Post remote left nephrectomy. Electronically Signed   By: Narda Rutherford M.D.   On: December 08, 2021 21:55   US Venous Img Lower Bilateral (DVT)  Result Date: 12-08-2021 CLINICAL DATA:  Lower extremity lymphedema and cellulitis. EXAM: BILATERAL LOWER EXTREMITY VENOUS DOPPLER ULTRASOUND TECHNIQUE: Gray-scale sonography with graded compression, as well as color Doppler and duplex ultrasound  were performed to evaluate the lower extremity deep venous systems from the level of the common femoral vein and including the common femoral, femoral, profunda femoral, popliteal and calf veins including the posterior tibial, peroneal and gastrocnemius veins when visible. The superficial great saphenous vein was also interrogated. Spectral Doppler was utilized to evaluate flow at rest and with distal augmentation maneuvers in the common femoral, femoral and popliteal veins. COMPARISON:  Prior study on 05/27/2021 FINDINGS: RIGHT LOWER EXTREMITY Common Femoral Vein: No evidence of thrombus. Normal compressibility, respiratory phasicity and response to augmentation. Saphenofemoral Junction: No evidence of thrombus. Normal compressibility and flow on color Doppler imaging. Profunda Femoral Vein: No evidence of thrombus. Normal compressibility and flow on color Doppler imaging. Femoral Vein: No evidence of thrombus. Normal compressibility, respiratory phasicity and response to augmentation. Popliteal Vein: No evidence of thrombus. Normal compressibility, respiratory phasicity and response to augmentation. Calf Veins: No evidence of thrombus. Normal compressibility and flow on color Doppler imaging. Superficial Great Saphenous Vein: No evidence of thrombus. Normal compressibility. Venous Reflux:  None. Other Findings: No evidence of superficial thrombophlebitis or abnormal fluid collection. LEFT LOWER EXTREMITY Common Femoral Vein: No evidence of thrombus. Normal compressibility, respiratory phasicity and response to augmentation. Saphenofemoral Junction: No evidence of thrombus. Normal compressibility and flow on color Doppler imaging. Profunda Femoral Vein: No evidence of thrombus. Normal compressibility and flow on color Doppler imaging. Femoral Vein: No evidence of thrombus. Normal compressibility, respiratory phasicity and response to augmentation. Popliteal Vein: No evidence of thrombus. Normal compressibility,  respiratory phasicity and response to augmentation. Calf Veins: No evidence of thrombus. Normal compressibility and flow on color Doppler imaging. Superficial Great Saphenous Vein: No evidence of thrombus. Normal compressibility. Venous Reflux:  None. Other Findings: No evidence of superficial thrombophlebitis or abnormal fluid collection. IMPRESSION: No evidence of deep venous thrombosis in either lower extremity. Electronically Signed   By: Irish Lack M.D.   On: 12-08-21 15:05   ECHOCARDIOGRAM COMPLETE  Result Date: 11/09/2021    ECHOCARDIOGRAM REPORT   Patient Name:   Victor Daniels Date of Exam: December 08, 2021 Medical Rec #:  [id number]             Height:       72.0 in Accession #:    [id number]            Weight:       258.2 lb Date of Birth:  Feb 28, 1946            BSA:          2.375 m Patient Age:    75 years              BP:           108/66 mmHg Patient Gender: M                     HR:           89 bpm. Exam Location:  [facility] Procedure: 2D Echo, Cardiac Doppler, Color Doppler and Intracardiac  Opacification Agent                                MODIFIED REPORT:      This report was modified by Yolonda Kida MD on 11/09/2021 due to                                 Mod/Severe TR.  Indications:     I51.7 Cardiomegaly  History:         Patient has prior history of Echocardiogram examinations, most                  recent 06/22/2020. CHF, Previous Myocardial Infarction and CAD,                  COPD and Stroke; Risk Factors:Diabetes, Dyslipidemia,                  Hypertension and Sleep Apnea. Pneumonia.  Sonographer:     Cresenciano Lick RDCS Referring Phys:  [id number] AGBATA Diagnosing Phys: Yolonda Kida MD IMPRESSIONS  1. Left ventricular ejection fraction, by estimation, is 60 to 65%. The left ventricle has normal function. The left ventricle has no regional wall motion abnormalities. Left ventricular diastolic parameters were normal.  2. Right ventricular systolic  function is normal. The right ventricular size is normal.  3. The mitral valve is normal in structure. No evidence of mitral valve regurgitation.  4. Tricuspid valve regurgitation is moderate to severe.  5. The aortic valve is normal in structure. Aortic valve regurgitation is not visualized. FINDINGS  Left Ventricle: Left ventricular ejection fraction, by estimation, is 60 to 65%. The left ventricle has normal function. The left ventricle has no regional wall motion abnormalities. Definity contrast agent was given IV to delineate the left ventricular  endocardial borders. The left ventricular internal cavity size was normal in size. There is borderline concentric left ventricular hypertrophy. Left ventricular diastolic parameters were normal. Right Ventricle: The right ventricular size is normal. No increase in right ventricular wall thickness. Right ventricular systolic function is normal. Left Atrium: Left atrial size was normal in size. Right Atrium: Right atrial size was normal in size. Pericardium: There is no evidence of pericardial effusion. Mitral Valve: The mitral valve is normal in structure. No evidence of mitral valve regurgitation. Tricuspid Valve: The tricuspid valve is normal in structure. Tricuspid valve regurgitation is moderate to severe. Aortic Valve: The aortic valve is normal in structure. Aortic valve regurgitation is not visualized. Pulmonic Valve: The pulmonic valve was normal in structure. Pulmonic valve regurgitation is not visualized. Aorta: The ascending aorta was not well visualized. IAS/Shunts: No atrial level shunt detected by color flow Doppler.  LEFT VENTRICLE PLAX 2D LVIDd:         4.30 cm LVIDs:         2.80 cm LV PW:         1.10 cm LV IVS:        1.10 cm LVOT diam:     2.20 cm LV SV:         69 LV SV Index:   29 LVOT Area:     3.80 cm  RIGHT VENTRICLE             IVC RV S prime:     13.40 cm/s  IVC diam: 2.70 cm TAPSE (M-mode): 1.4  cm LEFT ATRIUM           Index        RIGHT  ATRIUM           Index LA diam:      4.40 cm 1.85 cm/m   RA Area:     26.10 cm LA Vol (A2C): 74.9 ml 31.54 ml/m  RA Volume:   85.20 ml  35.88 ml/m LA Vol (A4C): 94.6 ml 39.84 ml/m  AORTIC VALVE LVOT Vmax:   97.90 cm/s LVOT Vmean:  71.767 cm/s LVOT VTI:    0.181 m  AORTA Ao Root diam: 3.00 cm Ao Asc diam:  3.90 cm MV E velocity: 107.00 cm/s  TRICUSPID VALVE                             TR Peak grad:   34.3 mmHg                             TR Vmax:        293.00 cm/s                              SHUNTS                             Systemic VTI:  0.18 m                             Systemic Diam: 2.20 cm Dwayne D Callwood MD Electronically signed by Yolonda Kida MD Signature Date/Time: 11/09/2021/1:25:51 PM    Final (Updated)    ASSESSMENT AND PLAN:    Victor Daniels is a 75 y.o. male with medical history significant for diabetes mellitus with complications of stage III chronic kidney disease, COPD with chronic respiratory failure on 2.5 L of oxygen continuous, chronic diastolic heart failure with last known LVEF of 55% from 2021, history of CVA, chronic venous stasis ulceration and dermatitis who presents to the ER for the second time in 2 days with complaints of pain all over but mostly in his lower extremities. Patient noted to have bilateral lower extremity wounds (Left > right) with drainage and foul odor.  Sepsis secondary to MRSA pneumonia, left sternoclavicular joint septic arthritis, Left Pectoralis muscles infection/abscess skin infection both lower extremity Possible Septic emboli -- presented with altered mental status, acute renal failure, elevated white count, tachycardia -- appreciate ID consultation with Dr. Delaine Lame. Continue IV vancomycin -- monitor fever and white count -- patient will need TEE-- message sent to cardiology --11/17-- spoke with Conley Rolls who was a neighbor and POA regarding ultrasound-guided left sternal clavicular/pectoral muscle abscess drained by IR  and TEE-- she understands risk and complications and wants the procedure done.  --UC -MRSA --BC-- Staph aureus  Leucocytosis --came in with 20.5K--18.4--18.6  Bilateral lower extremity chronic venous stasis and edema with chronic wounds cellulitis Scrotal edema -- wound consult for dressing changes -- IV vancomycin should cover -- ultrasound Doppler negative for DVT  Diabetes with stage III chronic kidney disease -- sliding scale insulin -- carb control diet  acute on chronic kidney disease stage III Left Nephrectomy -- patient came in with creatinine of 4.28 in the setting of sepsis -- IV fluids--- creatinine down to 1.6 (  4.28) -- baseline creatinine 1.6 --US renal --no acute abnormality  atrial fibrillation chronic -- continue Xarelto (holding for procedure)  COPD with chronic respiratory failure -- continue nebulizer and bronchodilator therapy  chronic diastolic dysfunction --Stable --Not acutely exacerbated --Hold torsemide, spironolactone, metolazone and Cozaar due to worsening renal function  Failure to thrive/unable to care for self/weakness --TOC for d/c planning    Procedures: 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  >50% time spent on counselling and coordination of care  Note: This dictation was prepared with Dragon dictation along with smaller phrase technology. Any transcriptional errors that result from this process are unintentional.  Fritzi Mandes M.D    [facility]   CC: Primary care physician; Crecencio Mc, MD Patient ID: Edson Snowball, male   DOB: [birth year], 75 y.o.   MRN: [id number]

## 2021-11-10 NOTE — Progress Notes (Signed)
Initial Nutrition Assessment  DOCUMENTATION CODES:   Obesity unspecified  INTERVENTION:   -RD will follow for diet advancement and add supplements as appropriate  NUTRITION DIAGNOSIS:   Increased nutrient needs related to acute illness as evidenced by estimated needs.  GOAL:   Patient will meet greater than or equal to 90% of their needs  MONITOR:   PO intake, Supplement acceptance, Labs, Weight trends, Skin, I & O's  REASON FOR ASSESSMENT:   Malnutrition Screening Tool    ASSESSMENT:   Patient is a 75 year old male admitted to the hospital for evaluation of bilateral pneumonia, septic arthritis of the left sternoclavicular joint and an abscess in the left pectoralis muscle.  Pt admitted with sepsis secondary to MRSA pneumonia.   11/17- s/p aspiration of lt chest abscess; s/p TEE  Reviewed I/O's: +300 ml x 24 hours and +605 ml since admission  Pt out of room at time of visit. No family at bedside. Unable to obtain further nutrition-related history or complete nutrition-focused physical exam.    Reviewed wt hx; wt has been stable over the past 9 months.  Lab Results  Component Value Date   HGBA1C 6.1 (H) 21-Nov-2021   PTA DM medications are none.   Labs reviewed: CBGS: 125-144 (inpatient orders for glycemic control are 0-15 units insulin aspart TID).    Diet Order:   Diet Order     None       EDUCATION NEEDS:   No education needs have been identified at this time  Skin:  Skin Assessment: Skin Integrity Issues: Skin Integrity Issues:: Other (Comment) Other: venous stasis ulcer lt leg  Last BM:  Unknown  Height:   Ht Readings from Last 1 Encounters:  11/23/2021 6' (1.829 m)    Weight:   Wt Readings from Last 1 Encounters:  10/28/2021 117.4 kg    Ideal Body Weight:  80.9 kg  BMI:  Body mass index is 35.1 kg/m.  Estimated Nutritional Needs:   Kcal:  2200-2400  Protein:  120-135 grams  Fluid:  > 2 L    Levada Schilling, RD, LDN,  CDCES Registered Dietitian II Certified Diabetes Care and Education Specialist Please refer to [facility] for RD and/or RD on-call/weekend/after hours pager

## 2021-11-10 NOTE — Progress Notes (Addendum)
Date of Admission:  11/23/2021     ID: Victor Daniels is a 75 y.o. male  Principal Problem:   Bilateral pneumonia Active Problems:   COPD (chronic obstructive pulmonary disease) (HCC)   CAD (coronary artery disease)   CKD stage 3 due to type 2 diabetes mellitus (HCC)   Atrial fibrillation with RVR (HCC)   Cellulitis of left lower extremity   Chronic diastolic CHF (congestive heart failure) (HCC)   Acute worsening of stage 3 chronic kidney disease (HCC)   Septic arthritis of left sternoclavicular joint (HCC)    Subjective: Patient is feeling better Underwent aspiration of the left pectoral abscess. Underwent TEE today and it did not show any vegetation. Medications:   butamben-tetracaine-benzocaine       fentaNYL       fentaNYL       fluticasone furoate-vilanterol  1 puff Inhalation Daily   And   umeclidinium bromide  1 puff Inhalation Daily   insulin aspart  0-15 Units Subcutaneous TID WC   lidocaine       lidocaine       midazolam       midazolam       [START ON 11/11/2021] rivaroxaban  20 mg Oral Q supper   simvastatin  20 mg Oral QHS   sodium chloride flush       vitamin B-12  1,000 mcg Oral Daily    Objective: Vital signs in last 24 hours: Temp:  [97.8 F (36.6 C)-98.6 F (37 C)] 98 F (36.7 C) (11/17 1932) Pulse Rate:  [53-99] 93 (11/17 1932) Resp:  [15-24] 16 (11/17 1932) BP: (93-130)/(55-88) 105/71 (11/17 1932) SpO2:  [88 %-99 %] 98 % (11/17 1932) Weight:  [117.4 kg] 117.4 kg (11/17 1045)  PHYSICAL EXAM:  General: More alert today.  Still somewhat somnolent Head: Normocephalic, without obvious abnormality, atraumatic. Lungs: Bilateral air entry. Heart: Regular rate and rhythm, no murmur, rub or gallop. Abdomen: Soft, non-tender,not distended. Bowel sounds normal. No masses Extremities: Bilateral venous edema with superficial ulcerating lesions on the left Skin: Bruising over the upper extremities Lymph: Cervical, supraclavicular  normal. Neurologic: Grossly non-focal  Lab Results Recent Labs    11/20/2021 0236 11/09/21 0721 11/04/2021 0536  WBC  --  18.6*  --   HGB  --  9.6*  --   HCT  --  29.9*  --   NA 133* 139  --   K 3.5 3.6  --   CL 89* 93*  --   CO2 34* 37*  --   BUN 103* 92*  --   CREATININE 2.51* 1.82* 1.63*   Liver Panel Recent Labs    10/26/2021 0236  PROT 7.1  ALBUMIN 2.1*  AST 21  ALT 15  ALKPHOS 164*  BILITOT 2.3*   Sedimentation Rate No results for input(s): ESRSEDRATE in the last 72 hours. C-Reactive Protein No results for input(s): CRP in the last 72 hours.  Microbiology:  Studies/Results: US RENAL  Result Date: 11/11/2021 CLINICAL DATA:  Acute kidney injury.  Left nephrectomy. EXAM: RENAL / URINARY TRACT ULTRASOUND COMPLETE COMPARISON:  None. FINDINGS: Right Kidney: Renal measurements: 13.8 x 7.6 x 7.1 cm = volume: 367 mL. Normal parenchymal echogenicity. No hydronephrosis. No visualized stone or focal lesion. Left Kidney: Surgically absent. Bladder: Appears normal for degree of bladder distention. Other: None. IMPRESSION: 1. Normal sonographic appearance of the right kidney. 2. Post remote left nephrectomy. Electronically Signed   By: Keith Rake M.D.   On: 11/03/2021 21:55  IR US Guide Bx Asp/Drain  Result Date: 10/29/2021 INDICATION: 75 year old with bacteremia and concern for abscesses along the left anterior chest wall musculature. EXAM: Ultrasound-guided aspiration of left anterior chest wall collections MEDICATIONS: Local anesthetic, 1% lidocaine ANESTHESIA/SEDATION: None COMPLICATIONS: None immediate. PROCEDURE: Informed consent was obtained for ultrasound-guided aspiration and possible drain placement within left chest wall fluid collections. Patient was evaluated with ultrasound. Left anterior chest was prepped with chlorhexidine and sterile field was created. Maximal barrier sterile technique was utilized including caps, mask, sterile gowns, sterile gloves, sterile  drape, hand hygiene and skin antiseptic. Skin was anesthetized using 1% lidocaine. Using ultrasound guidance, an 18 gauge needle and a 19 gauge Yueh catheter were directed into multiple areas within the left anterior chest musculature. Initially, needle was directed into the lateral aspect of the chest with small gas collections. Minimal thick bloody fluid was aspirated from these areas. Additional aspirations were performed along the more medial aspect of the chest where small fluid collections were identified with ultrasound. Yueh catheter was directed into the small fluid collections but only 6 ml of thick bloody fluid could be aspirated. Bandage placed at the puncture sites. FINDINGS: Small irregular fluid collections in the medial left anterior chest. Small amount of bloody fluid was aspirated from these collections. Soft tissue thickening along the lateral aspect of the left anterior chest wall musculature containing multiple foci of gas. Minimal fluid could be aspirated around these areas of gas. IMPRESSION: Complex collections in the left anterior chest wall musculature. Total of 6 mL of bloody thick fluid was aspirated from multiple areas in the left anterior chest wall musculature. Drain was not placed. Electronically Signed   By: Markus Daft M.D.   On: 11/15/2021 16:43   ECHOCARDIOGRAM COMPLETE  Result Date: 11/09/2021    ECHOCARDIOGRAM REPORT   Patient Name:   Victor Daniels Date of Exam: 11/13/2021 Medical Rec #:  [id number]             Height:       72.0 in Accession #:    [id number]            Weight:       258.2 lb Date of Birth:  November 08, 1946            BSA:          2.375 m Patient Age:    60 years              BP:           108/66 mmHg Patient Gender: M                     HR:           89 bpm. Exam Location:  [facility] Procedure: 2D Echo, Cardiac Doppler, Color Doppler and Intracardiac            Opacification Agent                                MODIFIED REPORT:      This report was modified  by Yolonda Kida MD on 11/09/2021 due to                                 Mod/Severe TR.  Indications:     I51.7 Cardiomegaly  History:  Patient has prior history of Echocardiogram examinations, most                  recent 06/22/2020. CHF, Previous Myocardial Infarction and CAD,                  COPD and Stroke; Risk Factors:Diabetes, Dyslipidemia,                  Hypertension and Sleep Apnea. Pneumonia.  Sonographer:     Cresenciano Lick RDCS Referring Phys:  [id number] AGBATA Diagnosing Phys: Yolonda Kida MD IMPRESSIONS  1. Left ventricular ejection fraction, by estimation, is 60 to 65%. The left ventricle has normal function. The left ventricle has no regional wall motion abnormalities. Left ventricular diastolic parameters were normal.  2. Right ventricular systolic function is normal. The right ventricular size is normal.  3. The mitral valve is normal in structure. No evidence of mitral valve regurgitation.  4. Tricuspid valve regurgitation is moderate to severe.  5. The aortic valve is normal in structure. Aortic valve regurgitation is not visualized. FINDINGS  Left Ventricle: Left ventricular ejection fraction, by estimation, is 60 to 65%. The left ventricle has normal function. The left ventricle has no regional wall motion abnormalities. Definity contrast agent was given IV to delineate the left ventricular  endocardial borders. The left ventricular internal cavity size was normal in size. There is borderline concentric left ventricular hypertrophy. Left ventricular diastolic parameters were normal. Right Ventricle: The right ventricular size is normal. No increase in right ventricular wall thickness. Right ventricular systolic function is normal. Left Atrium: Left atrial size was normal in size. Right Atrium: Right atrial size was normal in size. Pericardium: There is no evidence of pericardial effusion. Mitral Valve: The mitral valve is normal in structure. No evidence of  mitral valve regurgitation. Tricuspid Valve: The tricuspid valve is normal in structure. Tricuspid valve regurgitation is moderate to severe. Aortic Valve: The aortic valve is normal in structure. Aortic valve regurgitation is not visualized. Pulmonic Valve: The pulmonic valve was normal in structure. Pulmonic valve regurgitation is not visualized. Aorta: The ascending aorta was not well visualized. IAS/Shunts: No atrial level shunt detected by color flow Doppler.  LEFT VENTRICLE PLAX 2D LVIDd:         4.30 cm LVIDs:         2.80 cm LV PW:         1.10 cm LV IVS:        1.10 cm LVOT diam:     2.20 cm LV SV:         69 LV SV Index:   29 LVOT Area:     3.80 cm  RIGHT VENTRICLE             IVC RV S prime:     13.40 cm/s  IVC diam: 2.70 cm TAPSE (M-mode): 1.4 cm LEFT ATRIUM           Index        RIGHT ATRIUM           Index LA diam:      4.40 cm 1.85 cm/m   RA Area:     26.10 cm LA Vol (A2C): 74.9 ml 31.54 ml/m  RA Volume:   85.20 ml  35.88 ml/m LA Vol (A4C): 94.6 ml 39.84 ml/m  AORTIC VALVE LVOT Vmax:   97.90 cm/s LVOT Vmean:  71.767 cm/s LVOT VTI:    0.181 m  AORTA Ao Root diam:  3.00 cm Ao Asc diam:  3.90 cm MV E velocity: 107.00 cm/s  TRICUSPID VALVE                             TR Peak grad:   34.3 mmHg                             TR Vmax:        293.00 cm/s                              SHUNTS                             Systemic VTI:  0.18 m                             Systemic Diam: 2.20 cm Alwyn Pea MD Electronically signed by Alwyn Pea MD Signature Date/Time: 11/09/2021/1:25:51 PM    Final (Updated)    ECHO TEE  Result Date: 11/11/2021    TRANSESOPHOGEAL ECHO REPORT   Patient Name:   MADDUX VANSCYOC Date of Exam: 11/17/2021 Medical Rec #:  [id number]             Height:       72.0 in Accession #:    [id number]            Weight:       258.8 lb Date of Birth:  06-Dec-1946            BSA:          2.377 m Patient Age:    75 years              BP:           127/74 mmHg Patient  Gender: M                     HR:           87 bpm. Exam Location:  [facility] Procedure: Transesophageal Echo, Color Doppler, Cardiac Doppler and Saline            Contrast Bubble Study Indications:     Endocarditis, bacteremia  History:         Patient has prior history of Echocardiogram examinations, most                  recent 11/19/2021. CHF, CAD, COPD, Stroke, DVT and PAD; Risk                  Factors:Hypertension, Dyslipidemia and Sleep Apnea.  Sonographer:     Humphrey Rolls Referring Phys:  [id number] [name] Diagnosing Phys: Julien Nordmann MD PROCEDURE: After discussion of the risks and benefits of a TEE, an informed consent was obtained from the patient. TEE procedure time was 50 minutes. The transesophogeal probe was passed without difficulty through the esophogus of the patient. Imaged were obtained with the patient in a left lateral decubitus position. Local oropharyngeal anesthetic was provided with viscous lidocaine. Sedation performed by performing physician. Image quality was excellent. The patient's vital signs; including heart rate, blood pressure, and oxygen saturation; remained stable throughout the procedure. The patient developed no complications during the procedure. IMPRESSIONS  1. No  valve endocarditis  2. Left ventricular ejection fraction, by estimation, is 55 %. The left ventricle has normal function. The left ventricle has no regional wall motion abnormalities. There is mild left ventricular hypertrophy.  3. Right ventricular systolic function is normal. The right ventricular size is normal. There is moderately elevated pulmonary artery systolic pressure. The estimated right ventricular systolic pressure is 48.4 mmHg.  4. Left atrial size was mildly dilated. No left atrial/left atrial appendage thrombus was detected.  5. The mitral valve is normal in structure. Mild mitral valve regurgitation. No evidence of mitral stenosis.  6. Tricuspid valve regurgitation is moderate.  7. The aortic  valve is normal in structure. Aortic valve regurgitation is not visualized. Aortic valve sclerosis/calcification is present, without any evidence of aortic stenosis.  8. The inferior vena cava is normal in size with greater than 50% respiratory variability, suggesting right atrial pressure of 3 mmHg.  9. Agitated saline contrast bubble study was positive with shunting observed within 3-6 cardiac cycles suggestive of interatrial shunt. There is a small patent foramen ovale with predominantly right to left shunting across the atrial septum. Conclusion(s)/Recommendation(s): Normal biventricular function without evidence of hemodynamically significant valvular heart disease. FINDINGS  Left Ventricle: Left ventricular ejection fraction, by estimation, is 55 to 60%. The left ventricle has normal function. The left ventricle has no regional wall motion abnormalities. The left ventricular internal cavity size was normal in size. There is  mild left ventricular hypertrophy. Right Ventricle: The right ventricular size is normal. No increase in right ventricular wall thickness. Right ventricular systolic function is normal. There is moderately elevated pulmonary artery systolic pressure. The tricuspid regurgitant velocity is 3.10 m/s, and with an assumed right atrial pressure of 10 mmHg, the estimated right ventricular systolic pressure is 48.4 mmHg. Left Atrium: Left atrial size was mildly dilated. No left atrial/left atrial appendage thrombus was detected. Right Atrium: Right atrial size was normal in size. Pericardium: There is no evidence of pericardial effusion. Mitral Valve: The mitral valve is normal in structure. Mild mitral valve regurgitation. No evidence of mitral valve stenosis. Tricuspid Valve: The tricuspid valve is normal in structure. Tricuspid valve regurgitation is moderate . No evidence of tricuspid stenosis. Aortic Valve: The aortic valve is normal in structure. Aortic valve regurgitation is not visualized.  Aortic valve sclerosis/calcification is present, without any evidence of aortic stenosis. Pulmonic Valve: The pulmonic valve was normal in structure. Pulmonic valve regurgitation is mild. No evidence of pulmonic stenosis. Aorta: The aortic root is normal in size and structure. There is minimal (Grade I) atheroma plaque involving the descending aorta. Venous: The inferior vena cava is normal in size with greater than 50% respiratory variability, suggesting right atrial pressure of 3 mmHg. IAS/Shunts: No atrial level shunt detected by color flow Doppler. Agitated saline contrast was given intravenously to evaluate for intracardiac shunting. Agitated saline contrast bubble study was positive with shunting observed within 3-6 cardiac cycles suggestive of interatrial shunt. A small patent foramen ovale is detected with predominantly right to left shunting across the atrial septum. There is no evidence of an atrial septal defect.  TRICUSPID VALVE TR Peak grad:   38.4 mmHg TR Vmax:        310.00 cm/s Julien Nordmann MD Electronically signed by Julien Nordmann MD Signature Date/Time: Dec 03, 2021/6:33:04 PM    Final      Assessment/Plan:  Acute encephalopathy due to infection and AKI. Pain meds could be contributing . MRSA bacteremia with pectoralis muscle abscess and left sternoclavicular joint  septic arthritis. Patient is currently on vancomycin but because of 1 kidney and AKI even though creatinine is improving we will change to daptomycin.  TEE done today did not show any vegetation.  Repeat blood cultures have been sent.  Bilateral infiltrates in the lung secondary to what looks like septic emboli.  We will add doxycycline.  AKI on CKD improving  Patient has history of left nephrectomy   Discussed the management with the care team.

## 2021-11-10 NOTE — Sedation Documentation (Signed)
BUBBLE STUDY X 2

## 2021-11-10 NOTE — Progress Notes (Signed)
Pharmacy Antibiotic Note  Victor Daniels is a 75 y.o. male admitted on Nov 10, 2021 with bacteremia,  cellulitis as suspected source. Pharmacy has been consulted for vancomycin dosing.   Presents with worsening diffuse pain after leaving AMA 2 days prior. PMH includes CAD, CHF (EF 25%), Stage 3 CKD, CVA and PAD. CT shows possible septic emboli and septic arthritis of left sternoclavicular joint/abscess of the pectoralis muscle. Blood cultures positive for MRSA.  Initially presenting with AKI, though Scr improved to 1.72 today. Vanc random lvl resulting @ 16 after vancomycin 1500 mg x 1 dose. ID plan has changed to continue vancomycin given improvement in Scr.  Renal ultrasound shows no right kidney, history of nephrectomy. Switching to daptomycin to preserve renal function. Noted patient on simvastatin 20 mg QHS.  Plan: Discontinue vancomycin. Start daptomycin 750 mg once daily Baseline and weekly CK level  Monitor renal function, clinical course, LOT.   Height: 6' (182.9 cm) Weight: 117.4 kg (258 lb 13.1 oz) IBW/kg (Calculated) : 77.6  Temp (24hrs), Avg:98.2 F (36.8 C), Min:97.8 F (36.6 C), Max:98.6 F (37 C)  Recent Labs  Lab 11/06/21 1529 11/07/21 1854 11/07/21 1922 11/02/2021 0236 11/09/21 0721 11/15/2021 0536  WBC 20.5* 18.4*  --   --  18.6*  --   CREATININE 4.20*  --   --  2.51* 1.82* 1.63*  LATICACIDVEN  --   --  1.5  --   --   --   VANCORANDOM  --   --   --   --  16  --      Estimated Creatinine Clearance: 51.8 mL/min (A) (by C-G formula based on SCr of 1.63 mg/dL (H)).    Allergies  Allergen Reactions   Clonidine Derivatives     Reaction unknown   Sulfa Antibiotics     Reaction unknown    Antimicrobials this admission: 11/15 vancomycin >>  11/15 Zosyn >>   Dose adjustments this admission:   Microbiology results: 11/14 BCx: MRSA 3/4  11/14 UCx: S. Aureus (80,000 CFU)   Thank you for allowing pharmacy to be a part of this patient's  care.   Jaynie Bream, PharmD Pharmacy Resident  11/11/2021 4:54 PM

## 2021-11-10 NOTE — NC FL2 (Signed)
[place] LEVEL OF CARE SCREENING TOOL     IDENTIFICATION  Patient Name: Victor Daniels Birthdate: 1946/04/29 Sex: male Admission Date (Current Location): 11/10/2021  [facility] and [place] Number:  Engineering geologist and Address:  [facility], [address] [place], [place] [address]      Provider Number: [id number]  Attending Physician Name and Address:  Fritzi Mandes, MD  Relative Name and Phone Number:  Geronimo Boot     [place]    Current Level of Care: Hospital Recommended Level of Care: [place] Prior Approval Number:    Date Approved/Denied:   PASRR Number: [id number]  Discharge Plan: SNF    Current Diagnoses: Patient Active Problem List   Diagnosis Date Noted   Bilateral pneumonia 11/10/2021   Acute worsening of stage 3 chronic kidney disease ([place]) 11/02/2021   Septic arthritis of left sternoclavicular joint ([place]) 11/22/2021   Anasarca 10/27/2021   Bilateral leg edema 10/24/2021   Accidental drug overdose 06/14/2021   Cellulitis of left lower extremity 05/29/2021   Wound infection 05/29/2021   Chronic diastolic CHF (congestive heart failure) ([place]) 05/29/2021   Hypokalemia 05/29/2021   Lymphedema 05/26/2021   Abnormal gait 05/26/2021   Insomnia due to psychological stress 03/26/2021   Failure to thrive syndrome, adult 03/23/2021   DVT (deep venous thrombosis) ([place])    Leukocytosis    Hematuria, gross 07/19/2020   Acquired thrombophilia ([place]) 07/04/2020   Atrial fibrillation with RVR ([place])    CKD stage 3 due to type 2 diabetes mellitus ([place]) 03/30/2020   Degenerative joint disease of shoulder region 09/26/2018   Obstructive sleep apnea 11/22/2016   Hypogonadism male 07/07/2016   Obesity 06/03/2016   Reduced libido 05/11/2016   Visit for preventive health examination 03/01/2016   Diabetes mellitus with circulatory complication ([place]) [id number]   Shoulder pain, left  08/03/2015   Pneumonia 04/13/2015   Cervical radiculopathy due to degenerative joint disease of spine 03/24/2015   Iron deficiency anemia 02/25/2015   Generalized weakness 02/25/2015   Nocturia 02/25/2015   Pernicious anemia 01/14/2015   S/P hip replacement 12/12/2014   S/p nephrectomy 09/07/2014   Vertigo, central 08/20/2014   Senile purpura ([place]) 06/28/2014   Incomplete emptying of bladder 10/08/2013   Anemia 09/11/2013   Chest pain with high risk for cardiac etiology 04/08/2013   Benign localized prostatic hyperplasia with lower urinary tract symptoms (LUTS) 10/23/2012   Sciatica 10/23/2012   Venous insufficiency (chronic) (peripheral) 07/08/2012   Venous insufficiency    History of alcohol abuse    Vasomotor rhinitis 12/06/2011   CAD (coronary artery disease) 10/12/2011   COPD (chronic obstructive pulmonary disease) ([place])    CHF with left ventricular diastolic dysfunction, NYHA class 2 (HCC)    Hyperlipidemia    Hypertension     Orientation RESPIRATION BLADDER Height & Weight     Self, Situation, Place  Normal Continent Weight: 258 lb 13.1 oz (117.4 kg) Height:  6' (182.9 cm)  BEHAVIORAL SYMPTOMS/MOOD NEUROLOGICAL BOWEL NUTRITION STATUS      Continent Diet  AMBULATORY STATUS COMMUNICATION OF NEEDS Skin   Extensive Assist Verbally Normal                       Personal Care Assistance Level of Assistance  Bathing, Feeding, Dressing, Total care Bathing Assistance: Limited assistance Feeding assistance: Limited assistance Dressing Assistance: Limited assistance Total Care Assistance: Limited assistance   Functional Limitations Info  Sight, Hearing,  Speech Sight Info: Adequate Hearing Info: Adequate Speech Info: Adequate    SPECIAL CARE FACTORS FREQUENCY  PT (By licensed PT), OT (By licensed OT)     PT Frequency: 5X per week OT Frequency: 5x per week            Contractures Contractures Info: Not present    Additional Factors Info        Pfizer  COVID-19 Vaccine 11/02/2020 , 10/01/2020             Current Medications (10/31/2021):  This is the current hospital active medication list Current Facility-Administered Medications  Medication Dose Route Frequency Provider Last Rate Last Admin   0.9 %  sodium chloride infusion   Intravenous Continuous Gollan, Kathlene November, MD       [MAR Hold] acetaminophen (TYLENOL) tablet 650 mg  650 mg Oral Q6H PRN Agbata, Tochukwu, MD   650 mg at 11/15/2021 0926   Or   [MAR Hold] acetaminophen (TYLENOL) suppository 650 mg  650 mg Rectal Q6H PRN Agbata, Tochukwu, MD       butamben-tetracaine-benzocaine (CETACAINE) 01-26-13 % spray            fentaNYL (SUBLIMAZE) 100 MCG/2ML injection            fentaNYL (SUBLIMAZE) 100 MCG/2ML injection            [MAR Hold] fluticasone furoate-vilanterol (BREO ELLIPTA) 100-25 MCG/ACT 1 puff  1 puff Inhalation Daily Tawnya Crook, RPH   1 puff at 11/09/21 0819   And   [MAR Hold] umeclidinium bromide (INCRUSE ELLIPTA) 62.5 MCG/ACT 1 puff  1 puff Inhalation Daily Tawnya Crook, RPH       [MAR Hold] insulin aspart (novoLOG) injection 0-15 Units  0-15 Units Subcutaneous TID WC Agbata, Tochukwu, MD   2 Units at 11/06/2021 0941   [MAR Hold] ipratropium-albuterol (DUONEB) 0.5-2.5 (3) MG/3ML nebulizer solution 3 mL  3 mL Nebulization Q6H PRN Agbata, Tochukwu, MD       lidocaine (XYLOCAINE) 2 % viscous mouth solution            midazolam (VERSED) 2 MG/2ML injection            midazolam (VERSED) 2 MG/2ML injection            [MAR Hold] nitroGLYCERIN (NITROSTAT) SL tablet 0.4 mg  0.4 mg Sublingual Q5 min PRN Agbata, Tochukwu, MD       [MAR Hold] ondansetron (ZOFRAN) tablet 4 mg  4 mg Oral Q6H PRN Agbata, Tochukwu, MD       Or   [MAR Hold] ondansetron (ZOFRAN) injection 4 mg  4 mg Intravenous Q6H PRN Agbata, Tochukwu, MD       [MAR Hold] oxyCODONE (Oxy IR/ROXICODONE) immediate release tablet 15 mg  15 mg Oral Q6H PRN Agbata, Tochukwu, MD   15 mg at 11/15/2021 0403   [MAR Hold]  rivaroxaban (XARELTO) tablet 20 mg  20 mg Oral Q supper Lorna Dibble, [facility]       [MAR Hold] simvastatin (ZOCOR) tablet 20 mg  20 mg Oral QHS Agbata, Tochukwu, MD   20 mg at 11/09/21 2228   sodium chloride flush 0.9 % injection            [MAR Hold] vancomycin (VANCOREADY) IVPB 1500 mg/300 mL  1,500 mg Intravenous Q24H Wynelle Cleveland, [place] at 11/09/21 1639   [MAR Hold] vitamin B-12 (CYANOCOBALAMIN) tablet 1,000 mcg  1,000 mcg Oral Daily Agbata, Tochukwu, MD   1,000 mcg  at 11/20/2021 2081     Discharge Medications: Please see discharge summary for a list of discharge medications.  Relevant Imaging Results:  Relevant Lab Results:   Additional Information SS# [id number]  Joseph Art, LCSWA

## 2021-11-11 ENCOUNTER — Inpatient Hospital Stay: Payer: Medicare HMO

## 2021-11-11 ENCOUNTER — Ambulatory Visit: Payer: Medicare HMO | Admitting: Internal Medicine

## 2021-11-11 DIAGNOSIS — R7881 Bacteremia: Secondary | ICD-10-CM | POA: Diagnosis not present

## 2021-11-11 DIAGNOSIS — M009 Pyogenic arthritis, unspecified: Secondary | ICD-10-CM

## 2021-11-11 DIAGNOSIS — M60009 Infective myositis, unspecified site: Secondary | ICD-10-CM | POA: Diagnosis not present

## 2021-11-11 DIAGNOSIS — L02213 Cutaneous abscess of chest wall: Secondary | ICD-10-CM

## 2021-11-11 DIAGNOSIS — L03116 Cellulitis of left lower limb: Secondary | ICD-10-CM | POA: Diagnosis not present

## 2021-11-11 DIAGNOSIS — I4891 Unspecified atrial fibrillation: Secondary | ICD-10-CM | POA: Diagnosis not present

## 2021-11-11 DIAGNOSIS — N183 Chronic kidney disease, stage 3 unspecified: Secondary | ICD-10-CM | POA: Diagnosis not present

## 2021-11-11 LAB — CBC
HCT: 34.2 % — ABNORMAL LOW (ref 39.0–52.0)
Hemoglobin: 10.5 g/dL — ABNORMAL LOW (ref 13.0–17.0)
MCH: 25.2 pg — ABNORMAL LOW (ref 26.0–34.0)
MCHC: 30.7 g/dL (ref 30.0–36.0)
MCV: 82.2 fL (ref 80.0–100.0)
Platelets: 175 10*3/uL (ref 150–400)
RBC: 4.16 MIL/uL — ABNORMAL LOW (ref 4.22–5.81)
RDW: 17.5 % — ABNORMAL HIGH (ref 11.5–15.5)
WBC: 21.2 10*3/uL — ABNORMAL HIGH (ref 4.0–10.5)
nRBC: 0.1 % (ref 0.0–0.2)

## 2021-11-11 LAB — CULTURE, BLOOD (ROUTINE X 2)
Special Requests: ADEQUATE
Special Requests: ADEQUATE

## 2021-11-11 LAB — GLUCOSE, CAPILLARY
Glucose-Capillary: 143 mg/dL — ABNORMAL HIGH (ref 70–99)
Glucose-Capillary: 165 mg/dL — ABNORMAL HIGH (ref 70–99)
Glucose-Capillary: 176 mg/dL — ABNORMAL HIGH (ref 70–99)
Glucose-Capillary: 123 mg/dL — ABNORMAL HIGH (ref 70–99)

## 2021-11-11 LAB — CK: Total CK: 28 U/L — ABNORMAL LOW (ref 49–397)

## 2021-11-11 LAB — CREATININE, SERUM
Creatinine, Ser: 1.48 mg/dL — ABNORMAL HIGH (ref 0.61–1.24)
GFR, Estimated: 49 mL/min — ABNORMAL LOW (ref >60)

## 2021-11-11 MED ORDER — CHLORHEXIDINE GLUCONATE 0.12 % MT SOLN
15.0000 mL | Freq: Two times a day (BID) | OROMUCOSAL | Status: DC
  Administered 2021-11-11 – 2021-11-15 (×8): 15 mL via OROMUCOSAL
  Filled 2021-11-11 (×8): qty 15

## 2021-11-11 MED ORDER — IOHEXOL 300 MG/ML  SOLN
80.0000 mL | Freq: Once | INTRAMUSCULAR | Status: AC | PRN
  Administered 2021-11-11: 80 mL via INTRAVENOUS

## 2021-11-11 MED ORDER — ENSURE ENLIVE PO LIQD
237.0000 mL | Freq: Two times a day (BID) | ORAL | Status: DC
  Administered 2021-11-11 – 2021-11-14 (×5): 237 mL via ORAL

## 2021-11-11 MED ORDER — ORAL CARE MOUTH RINSE
15.0000 mL | Freq: Two times a day (BID) | OROMUCOSAL | Status: DC
  Administered 2021-11-12 – 2021-11-14 (×5): 15 mL via OROMUCOSAL

## 2021-11-11 MED ORDER — ADULT MULTIVITAMIN W/MINERALS CH
1.0000 | ORAL_TABLET | Freq: Every day | ORAL | Status: DC
  Administered 2021-11-11 – 2021-11-13 (×3): 1 via ORAL
  Filled 2021-11-11 (×3): qty 1

## 2021-11-11 MED ORDER — DOXYCYCLINE HYCLATE 100 MG PO TABS
100.0000 mg | ORAL_TABLET | Freq: Two times a day (BID) | ORAL | Status: DC
  Administered 2021-11-11 – 2021-11-14 (×7): 100 mg via ORAL
  Filled 2021-11-11 (×7): qty 1

## 2021-11-11 NOTE — Progress Notes (Addendum)
Pharmacy Antibiotic Note  Victor Daniels is a 75 y.o. male admitted on 11/24/2021 with bacteremia,  cellulitis as suspected source. Pharmacy has been consulted for vancomycin dosing.   Presents with worsening diffuse pain after leaving AMA 2 days prior. PMH includes CAD, CHF (EF 25%), Stage 3 CKD, CVA and PAD. CT shows possible septic emboli and septic arthritis of left sternoclavicular joint/abscess of the pectoralis muscle. Blood and chest wall cultures are growing MRSA. S/p IR aspiration on 11/17.  AKI improving, however renal ultrasound shows no right kidney. History of nephrectomy. Switching to daptomycin to preserve renal function. Noted patient on simvastatin 20 mg QHS. BL CK level 28.  Plan: Continue daptomycin 750 mg once daily Baseline and weekly CK level  Monitor renal function, clinical course, LOT.   Height: 6' (182.9 cm) Weight: 117.4 kg (258 lb 13.1 oz) IBW/kg (Calculated) : 77.6  Temp (24hrs), Avg:97.9 F (36.6 C), Min:97.4 F (36.3 C), Max:98.3 F (36.8 C)  Recent Labs  Lab 11/06/21 1529 11/07/21 1854 11/07/21 1922 November 24, 2021 0236 11/09/21 0721 11/17/2021 0536  WBC 20.5* 18.4*  --   --  18.6*  --   CREATININE 4.20*  --   --  2.51* 1.82* 1.63*  LATICACIDVEN  --   --  1.5  --   --   --   VANCORANDOM  --   --   --   --  16  --      Estimated Creatinine Clearance: 51.8 mL/min (A) (by C-G formula based on SCr of 1.63 mg/dL (H)).    Allergies  Allergen Reactions   Clonidine Derivatives     Reaction unknown   Sulfa Antibiotics     Reaction unknown    Antimicrobials this admission: 11/15 vancomycin >>  11/15 Zosyn >>   Dose adjustments this admission:   Microbiology results: 11/14 BCx: MRSA 3/4  11/14 UCx: S. Aureus (80,000 CFU) 11/17 chest wall: moderate S. aureus  Thank you for allowing pharmacy to be a part of this patient's care.   Jaynie Bream, PharmD Pharmacy Resident  11/11/2021 7:23 AM

## 2021-11-11 NOTE — Progress Notes (Signed)
Nutrition Follow-up  DOCUMENTATION CODES:   Obesity unspecified  INTERVENTION:   -Ensure Enlive po BID, each supplement provides 350 kcal and 20 grams of protein  -MVI with minerals daily  NUTRITION DIAGNOSIS:   Increased nutrient needs related to acute illness as evidenced by estimated needs.  Ongoing  GOAL:   Patient will meet greater than or equal to 90% of their needs  Progressing   MONITOR:   PO intake, Supplement acceptance, Labs, Weight trends, Skin, I & O's  REASON FOR ASSESSMENT:   Malnutrition Screening Tool    ASSESSMENT:   Patient is a 75 year old male admitted to the hospital for evaluation of bilateral pneumonia, septic arthritis of the left sternoclavicular joint and an abscess in the left pectoralis muscle.  11/17- s/p aspiration of lt chest abscess; s/p TEE 11/18- s/p BSE- advanced to dysphagia 3 diet with thin liquids  Reviewed I/O's: -400 ml x 24 hours and +205 ml since admission  Per MD notes, TEE negative for vegetations.   Pt sleeping soundly at time of visit and did not arouse to touch.   No meal completion data available to assess at this time.   Reviewed wt hx; wt has been stable over the past 9 months.  Pt with increased nutritional needs and would benefit from addition of oral nutrition supplements.   Medications reviewed and include vitamin B-12.   Labs reviewed: CBGS: 120-165 (inpatient orders for glycemic control are 0-15 units insulin aspart TID with meals).    NUTRITION - FOCUSED PHYSICAL EXAM:  Flowsheet Row Most Recent Value  Orbital Region Mild depletion  Upper Arm Region No depletion  Thoracic and Lumbar Region No depletion  Buccal Region No depletion  Temple Region No depletion  Clavicle Bone Region No depletion  Clavicle and Acromion Bone Region No depletion  Scapular Bone Region No depletion  Dorsal Hand No depletion  Patellar Region No depletion  Anterior Thigh Region No depletion  Posterior Calf Region No  depletion  Edema (RD Assessment) Mild  Hair Reviewed  Eyes Reviewed  Mouth Reviewed  Skin Reviewed  Nails Reviewed       Diet Order:   Diet Order             DIET DYS 3 Room service appropriate? Yes with Assist; Fluid consistency: Nectar Thick  Diet effective now                   EDUCATION NEEDS:   No education needs have been identified at this time  Skin:  Skin Assessment: Skin Integrity Issues: Skin Integrity Issues:: Other (Comment) Other: venous stasis ulcer lt leg  Last BM:  Unknown  Height:   Ht Readings from Last 1 Encounters:  11/07/2021 6' (1.829 m)    Weight:   Wt Readings from Last 1 Encounters:  10/25/2021 117.4 kg    Ideal Body Weight:  80.9 kg  BMI:  Body mass index is 35.1 kg/m.  Estimated Nutritional Needs:   Kcal:  2200-2400  Protein:  120-135 grams  Fluid:  > 2 L    Levada Schilling, RD, LDN, CDCES Registered Dietitian II Certified Diabetes Care and Education Specialist Please refer to [facility] for RD and/or RD on-call/weekend/after hours pager

## 2021-11-11 NOTE — Progress Notes (Signed)
ID Patient is somnolent Upon calling his name he opens his eyes Is following commands Is verbally appropriate He says he has got pain in his right shoulder  O/E Somnolent but more awake today Patient Vitals for the past 24 hrs:  BP Temp Pulse Resp SpO2  11/11/21 1944 121/69 99.2 F (37.3 C) (!) 108 20 94 %  11/11/21 1542 138/68 97.9 F (36.6 C) (!) 104 18 95 %  11/11/21 1156 122/75 98.2 F (36.8 C) 99 18 97 %  11/11/21 0756 125/81 98.2 F (36.8 C) 92 18 --  11/11/21 0225 124/83 (!) 97.4 F (36.3 C) 93 16 95 %  11/11/21 0000 -- -- 84 -- --  11/07/2021 2350 (!) 155/73 97.6 F (36.4 C) (!) 43 16 92 %    Chest B/l air entry [id number] Abd soft Rt arm passive movt restricted at shoulder and painful Scar over the rt shoulder Both legs has wounds   Labs CBC Latest Ref Rng & Units 11/11/2021 11/09/2021 11/07/2021  WBC 4.0 - 10.5 K/uL 21.2(H) 18.6(H) 18.4(H)  Hemoglobin 13.0 - 17.0 g/dL 10.5(L) 9.6(L) 10.0(L)  Hematocrit 39.0 - 52.0 % 34.2(L) 29.9(L) 31.2(L)  Platelets 150 - 400 K/uL 175 122(L) 199     CMP Latest Ref Rng & Units 11/11/2021 11/03/2021 11/09/2021  Glucose 70 - 99 mg/dL - - 458(K)  BUN 8 - 23 mg/dL - - 99(I)  Creatinine 3.38 - 1.24 mg/dL 2.50(N) 3.97(Q) 7.34(L)  Sodium 135 - 145 mmol/L - - 139  Potassium 3.5 - 5.1 mmol/L - - 3.6  Chloride 98 - 111 mmol/L - - 93(L)  CO2 22 - 32 mmol/L - - 37(H)  Calcium 8.9 - 10.3 mg/dL - - 8.7(L)  Total Protein 6.5 - 8.1 g/dL - - -  Total Bilirubin 0.3 - 1.2 mg/dL - - -  Alkaline Phos 38 - 126 U/L - - -  AST 15 - 41 U/L - - -  ALT 0 - 44 U/L - - -    Micro 11/13/2021 blood culture follow-up for MRSA positive 11/09/2021 blood culture 1 out of 2 MRSA positive 11/03/2021 pectoralis abscess culture MRSA  Impression/recommendation MRSA bacteremia with disseminated infection.  Pectoralis muscle abscess status post aspiration Bilateral leg wounds left more than right which would be the source TEE is negative for  vegetation Right shoulder pain and restricted mobility need to get MRI to look for infection Bilateral septic emboli with infiltrate Left sternoclavicular joint septic arthritis as per imaging  Patient is on daptomycin and doxycycline Need to repeat blood culture until it is clear of bacteremia  Encephalopathy slightly improving  AKI improving Leukocytosis increasing.  Patient does not have adequate source control.  Patient has a history of left nephrectomy  Discussed the management with the hospitalist  ID will follow him peripherally this weekend call if needed.

## 2021-11-11 NOTE — Progress Notes (Signed)
[facility]  - [place] [facility]   PATIENT NAME: Victor Daniels    MR#:  [id number]  DATE OF BIRTH:  05/15/1946  SUBJECTIVE:   patient keeps his eyes closed.No fever. No new issues per RN REVIEW OF SYSTEMS:   Review of Systems  Constitutional:  Negative for chills, fever and weight loss.  HENT:  Negative for ear discharge, ear pain and nosebleeds.   Eyes:  Negative for blurred vision, pain and discharge.  Respiratory:  Positive for shortness of breath. Negative for sputum production, wheezing and stridor.   Cardiovascular:  Negative for chest pain, palpitations, orthopnea and PND.  Gastrointestinal:  Negative for abdominal pain, diarrhea, nausea and vomiting.  Genitourinary:  Negative for frequency and urgency.  Musculoskeletal:  Positive for back pain and joint pain.  Neurological:  Negative for sensory change, speech change, focal weakness and weakness.  Psychiatric/Behavioral:  Negative for depression and hallucinations. The patient is not nervous/anxious.   Tolerating Diet: yes Tolerating PT: rehab  DRUG ALLERGIES:   Allergies  Allergen Reactions   Clonidine Derivatives     Reaction unknown   Sulfa Antibiotics     Reaction unknown    VITALS:  Blood pressure 122/75, pulse 99, temperature 98.2 F (36.8 C), resp. rate 18, height 6' (1.829 m), weight 117.4 kg, SpO2 97 %.  PHYSICAL EXAMINATION:   Physical Exam  GENERAL:  75 y.o.-year-old patient lying in the bed with no acute distress. Disheveled,obesity HEENT: Head atraumatic, normocephalic. Oropharynx and nasopharynx clear.  LUNGS: decreased breath sounds bilaterally, no wheezing, rales, rhonchi. No use of accessory muscles of respiration.  CARDIOVASCULAR: S1, S2 normal. No murmurs, rubs, or gallops. tachycardia ABDOMEN: Soft, nontender, nondistended. Bowel sounds present. No organomegaly or mass.  EXTREMITIES: Left LE with circumferential weeping and ulcerations that are partial thickness  and almost linear in appearance(covers entire LLE calf and pretibial region Right LE; scattered areas of eschar/scabbing also with some linear in appearance areas. Not truly opened wounds.  May have some excoriation as well on this RLE which is common with venous dermatitis  NEUROLOGIC: nonfocal PSYCHIATRIC:  patient is alert SKIN:as above  LABORATORY PANEL:  CBC Recent Labs  Lab 11/11/21 0856  WBC 21.2*  HGB 10.5*  HCT 34.2*  PLT 175     Chemistries  Recent Labs  Lab November 10, 2021 0236 11/09/21 0721 11/14/2021 0536 11/11/21 0803  NA 133* 139  --   --   K 3.5 3.6  --   --   CL 89* 93*  --   --   CO2 34* 37*  --   --   GLUCOSE 152* 136*  --   --   BUN 103* 92*  --   --   CREATININE 2.51* 1.82*   < > 1.48*  CALCIUM 8.5* 8.7*  --   --   MG 2.0  --   --   --   AST 21  --   --   --   ALT 15  --   --   --   ALKPHOS 164*  --   --   --   BILITOT 2.3*  --   --   --    < > = values in this interval not displayed.    Cardiac Enzymes No results for input(s): TROPONINI in the last 168 hours. RADIOLOGY:  IR US Guide Bx Asp/Drain  Result Date: 11/19/2021 INDICATION: 75 year old with bacteremia and concern for abscesses along the left anterior chest wall musculature. EXAM: Ultrasound-guided  aspiration of left anterior chest wall collections MEDICATIONS: Local anesthetic, 1% lidocaine ANESTHESIA/SEDATION: None COMPLICATIONS: None immediate. PROCEDURE: Informed consent was obtained for ultrasound-guided aspiration and possible drain placement within left chest wall fluid collections. Patient was evaluated with ultrasound. Left anterior chest was prepped with chlorhexidine and sterile field was created. Maximal barrier sterile technique was utilized including caps, mask, sterile gowns, sterile gloves, sterile drape, hand hygiene and skin antiseptic. Skin was anesthetized using 1% lidocaine. Using ultrasound guidance, an 18 gauge needle and a 19 gauge Yueh catheter were directed into multiple  areas within the left anterior chest musculature. Initially, needle was directed into the lateral aspect of the chest with small gas collections. Minimal thick bloody fluid was aspirated from these areas. Additional aspirations were performed along the more medial aspect of the chest where small fluid collections were identified with ultrasound. Yueh catheter was directed into the small fluid collections but only 6 ml of thick bloody fluid could be aspirated. Bandage placed at the puncture sites. FINDINGS: Small irregular fluid collections in the medial left anterior chest. Small amount of bloody fluid was aspirated from these collections. Soft tissue thickening along the lateral aspect of the left anterior chest wall musculature containing multiple foci of gas. Minimal fluid could be aspirated around these areas of gas. IMPRESSION: Complex collections in the left anterior chest wall musculature. Total of 6 mL of bloody thick fluid was aspirated from multiple areas in the left anterior chest wall musculature. Drain was not placed. Electronically Signed   By: Markus Daft M.D.   On: 11/01/2021 16:43   ECHO TEE  Result Date: 10/25/2021    TRANSESOPHOGEAL ECHO REPORT   Patient Name:   Victor Daniels Date of Exam: 11/08/2021 Medical Rec #:  [id number]             Height:       72.0 in Accession #:    [id number]            Weight:       258.8 lb Date of Birth:  1946-12-04            BSA:          2.377 m Patient Age:    66 years              BP:           127/74 mmHg Patient Gender: M                     HR:           87 bpm. Exam Location:  [facility] Procedure: Transesophageal Echo, Color Doppler, Cardiac Doppler and Saline            Contrast Bubble Study Indications:     Endocarditis, bacteremia  History:         Patient has prior history of Echocardiogram examinations, most                  recent 11/18/2021. CHF, CAD, COPD, Stroke, DVT and PAD; Risk                  Factors:Hypertension, Dyslipidemia and Sleep  Apnea.  Sonographer:     Charmayne Sheer Referring Phys:  [place] Diagnosing Phys: Ida Rogue MD PROCEDURE: After discussion of the risks and benefits of a TEE, an informed consent was obtained from the patient. TEE procedure time was 50 minutes. The transesophogeal probe was passed without  difficulty through the esophogus of the patient. Imaged were obtained with the patient in a left lateral decubitus position. Local oropharyngeal anesthetic was provided with viscous lidocaine. Sedation performed by performing physician. Image quality was excellent. The patient's vital signs; including heart rate, blood pressure, and oxygen saturation; remained stable throughout the procedure. The patient developed no complications during the procedure. IMPRESSIONS  1. No valve endocarditis  2. Left ventricular ejection fraction, by estimation, is 55 %. The left ventricle has normal function. The left ventricle has no regional wall motion abnormalities. There is mild left ventricular hypertrophy.  3. Right ventricular systolic function is normal. The right ventricular size is normal. There is moderately elevated pulmonary artery systolic pressure. The estimated right ventricular systolic pressure is [id number] mmHg.  4. Left atrial size was mildly dilated. No left atrial/left atrial appendage thrombus was detected.  5. The mitral valve is normal in structure. Mild mitral valve regurgitation. No evidence of mitral stenosis.  6. Tricuspid valve regurgitation is moderate.  7. The aortic valve is normal in structure. Aortic valve regurgitation is not visualized. Aortic valve sclerosis/calcification is present, without any evidence of aortic stenosis.  8. The inferior vena cava is normal in size with greater than 50% respiratory variability, suggesting right atrial pressure of 3 mmHg.  9. Agitated saline contrast bubble study was positive with shunting observed within 3-6 cardiac cycles suggestive of interatrial shunt. There is  a small patent foramen ovale with predominantly right to left shunting across the atrial septum. Conclusion(s)/Recommendation(s): Normal biventricular function without evidence of hemodynamically significant valvular heart disease. FINDINGS  Left Ventricle: Left ventricular ejection fraction, by estimation, is 55 to 60%. The left ventricle has normal function. The left ventricle has no regional wall motion abnormalities. The left ventricular internal cavity size was normal in size. There is  mild left ventricular hypertrophy. Right Ventricle: The right ventricular size is normal. No increase in right ventricular wall thickness. Right ventricular systolic function is normal. There is moderately elevated pulmonary artery systolic pressure. The tricuspid regurgitant velocity is 3.10 m/s, and with an assumed right atrial pressure of 10 mmHg, the estimated right ventricular systolic pressure is [id number] mmHg. Left Atrium: Left atrial size was mildly dilated. No left atrial/left atrial appendage thrombus was detected. Right Atrium: Right atrial size was normal in size. Pericardium: There is no evidence of pericardial effusion. Mitral Valve: The mitral valve is normal in structure. Mild mitral valve regurgitation. No evidence of mitral valve stenosis. Tricuspid Valve: The tricuspid valve is normal in structure. Tricuspid valve regurgitation is moderate . No evidence of tricuspid stenosis. Aortic Valve: The aortic valve is normal in structure. Aortic valve regurgitation is not visualized. Aortic valve sclerosis/calcification is present, without any evidence of aortic stenosis. Pulmonic Valve: The pulmonic valve was normal in structure. Pulmonic valve regurgitation is mild. No evidence of pulmonic stenosis. Aorta: The aortic root is normal in size and structure. There is minimal (Grade I) atheroma plaque involving the descending aorta. Venous: The inferior vena cava is normal in size with greater than 50% respiratory  variability, suggesting right atrial pressure of 3 mmHg. IAS/Shunts: No atrial level shunt detected by color flow Doppler. Agitated saline contrast was given intravenously to evaluate for intracardiac shunting. Agitated saline contrast bubble study was positive with shunting observed within 3-6 cardiac cycles suggestive of interatrial shunt. A small patent foramen ovale is detected with predominantly right to left shunting across the atrial septum. There is no evidence of an atrial septal defect.  TRICUSPID VALVE  TR Peak grad:   38.4 mmHg TR Vmax:        310.00 cm/s Ida Rogue MD Electronically signed by Ida Rogue MD Signature Date/Time: 11/09/2021/6:33:04 PM    Final    ASSESSMENT AND PLAN:    Victor Daniels is a 75 y.o. male with medical history significant for diabetes mellitus with complications of stage III chronic kidney disease, COPD with chronic respiratory failure on 2.5 L of oxygen continuous, chronic diastolic heart failure with last known LVEF of 55% from 2021, history of CVA, chronic venous stasis ulceration and dermatitis who presents to the ER for the second time in 2 days with complaints of pain all over but mostly in his lower extremities. Patient noted to have bilateral lower extremity wounds (Left > right) with drainage and foul odor.  Sepsis secondary to MRSA pneumonia, left sternoclavicular joint septic arthritis, Left Pectoralis muscles infection/abscess skin infection both lower extremity Possible Septic emboli -- presented with altered mental status, acute renal failure, elevated white count, tachycardia -- appreciate ID consultation with Dr. Delaine Lame. Continue IV vancomycin -- monitor fever and white count -- patient will need TEE-- message sent to cardiology --11/17-- spoke with Conley Rolls who was a neighbor and POA regarding ultrasound-guided left sternal clavicular/pectoral muscle abscess drained by IR and TEE-- she understands risk and complications and  wants the procedure done.  --UC -MRSA --BC-- MR Staph aureus --11/18--WC from left sternoclavicular joint --Staph aureus --now on IV daptomycin and Doxycycline  Leucocytosis --came in with 20.5K--18.4--18.6  Bilateral lower extremity chronic venous stasis and edema with chronic wounds cellulitis Scrotal edema -- wound consult for dressing changes -- IV vancomycin should cover -- ultrasound Doppler negative for DVT  Diabetes with stage III chronic kidney disease -- sliding scale insulin -- carb control diet  acute on chronic kidney disease stage III Left Nephrectomy -- patient came in with creatinine of 4.28 in the setting of sepsis -- IV fluids--- creatinine down to 1.6 (4.28)--1.4 -- baseline creatinine 1.6 --US renal --no acute abnormality  atrial fibrillation chronic -- continue Xarelto (holding for procedure)  COPD with chronic respiratory failure -- continue nebulizer and bronchodilator therapy  chronic diastolic dysfunction --Stable --Not acutely exacerbated --Hold torsemide, spironolactone, metolazone and Cozaar due to worsening renal function  Failure to thrive/unable to care for self/weakness --TOC for d/c planning    Procedures:TEE, CT guided drainage of left sternoclavicular abscess 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  >50% time spent on counselling and coordination of care  Note: This dictation was prepared with Dragon dictation along with smaller phrase technology. Any transcriptional errors that result from this process are unintentional.  Fritzi Mandes M.D    [facility]   CC: Primary care physician; Crecencio Mc, MD Patient ID: Edson Snowball, male   DOB: August 04, 1946, 75 y.o.   MRN: [id number]

## 2021-11-11 NOTE — Progress Notes (Signed)
PT Cancellation Note  Patient Details Name: Victor Daniels MRN: [id number] DOB: June 02, 1946   Cancelled Treatment:     PT attempt. Pt too lethargic to participate. Required light sternal rub to wake. Greets therapist before falling back to sleep mid sentence. PT will continue to follow and progress as able per current POC.    Rushie Chestnut 11/11/2021, 4:38 PM
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
## 2021-11-12 DIAGNOSIS — N183 Chronic kidney disease, stage 3 unspecified: Secondary | ICD-10-CM | POA: Diagnosis not present

## 2021-11-12 DIAGNOSIS — I4891 Unspecified atrial fibrillation: Secondary | ICD-10-CM | POA: Diagnosis not present

## 2021-11-12 DIAGNOSIS — R7881 Bacteremia: Secondary | ICD-10-CM | POA: Diagnosis not present

## 2021-11-12 DIAGNOSIS — L03116 Cellulitis of left lower limb: Secondary | ICD-10-CM | POA: Diagnosis not present

## 2021-11-12 LAB — CULTURE, BLOOD (ROUTINE X 2): Special Requests: ADEQUATE

## 2021-11-12 LAB — GLUCOSE, CAPILLARY
Glucose-Capillary: 154 mg/dL — ABNORMAL HIGH (ref 70–99)
Glucose-Capillary: 134 mg/dL — ABNORMAL HIGH (ref 70–99)
Glucose-Capillary: 185 mg/dL — ABNORMAL HIGH (ref 70–99)
Glucose-Capillary: 127 mg/dL — ABNORMAL HIGH (ref 70–99)

## 2021-11-12 LAB — CBC
HCT: 32.6 % — ABNORMAL LOW (ref 39.0–52.0)
Hemoglobin: 10.1 g/dL — ABNORMAL LOW (ref 13.0–17.0)
MCH: 24.3 pg — ABNORMAL LOW (ref 26.0–34.0)
MCHC: 31 g/dL (ref 30.0–36.0)
MCV: 78.6 fL — ABNORMAL LOW (ref 80.0–100.0)
Platelets: 268 10*3/uL (ref 150–400)
RBC: 4.15 MIL/uL — ABNORMAL LOW (ref 4.22–5.81)
RDW: 18.1 % — ABNORMAL HIGH (ref 11.5–15.5)
WBC: 20.3 10*3/uL — ABNORMAL HIGH (ref 4.0–10.5)
nRBC: 0.1 % (ref 0.0–0.2)

## 2021-11-12 MED ORDER — SODIUM CHLORIDE 0.9 % IV SOLN: INTRAVENOUS | Status: DC | PRN

## 2021-11-12 NOTE — Plan of Care (Signed)
  Problem: Health Behavior/Discharge Planning: Goal: Ability to manage health-related needs will improve Outcome: Progressing   Problem: Clinical Measurements: Goal: Cardiovascular complication will be avoided Outcome: Progressing   Problem: Education: Goal: Knowledge of General Education information will improve Description: Including pain rating scale, medication(s)/side effects and non-pharmacologic comfort measures Outcome: Not Progressing

## 2021-11-12 NOTE — Progress Notes (Addendum)
Speech Language Pathology Treatment: Dysphagia  Patient Details Name: Victor Daniels MRN: [id number] DOB: Nov 20, 1946 Today's Date: 11/12/2021 Time: 1100-1120 SLP Time Calculation (min) (ACUTE ONLY): 20 min  Assessment / Plan / Recommendation Clinical Impression  Patient seen for skilled ST treatment to assess diet tolerance. Per nurse tech, pt took only two few bites of food from breakfast, preferred to have applesauce. Patient is awake but maintains eyes closed, opening only for brief period upon request. He answers most questions "Yes," often before the question is completed. He was unable to participate at all in self-feeding. Congested coughing at baseline.Required total assist for sips of nectar by teaspoon and cup, accepted only 2 bites of puree. Given cognitive decline and reduced ability to participate in self-feeding, recommend downgrade to dysphagia (puree) and nectar liquids, with full supervision/assistance. SLP to follow up for tolerance. Continue with precautions posted at [facility]; aspiration precautions; reduce Distractions during meals and attempt to engage pt during po's at meal for self-feeding. Pills Crushed in Puree for safer swallowing. Support w/ feeding at meals. Continue to recommend Palliative Care consult for goals of care.      HPI HPI: Pt is a 75 y.o. male with medical history significant for Morbid Obesity, diabetes mellitus with complications of stage III chronic kidney disease, COPD with chronic respiratory failure on 2.5 L of oxygen continuous, chronic diastolic heart failure with last known LVEF of 55% from 2021, history of CVA, chronic venous stasis ulceration and dermatitis who presents to the ER for the second time in 2 days with complaints of pain all over but mostly in his lower extremities.  Patient noted to have bilateral lower extremity wounds (Left > right) with drainage and foul odor.  Patient was seen in the emergency room on 11/14 and declined admission  at this time.  His neighbors had called EMS and they found him disoriented in his yard.  Pt was found anked in the yard.  He returns to the ER again 24 hours later with complaints of generalized body aches and pain.  He has been confused and agitated in the ED.  Recently lost his Wife to Cancer he stated.  Neighbors have been caring for him he stated.   Chest CT included: "Superimposed bilateral lower lobe Pneumonia. And Consider Septic  Emboli: with multiple indistinct bilateral pulmonary nodules  elsewhere.  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 No pleural effusion.".  Head CT: No evidence of acute intracranial abnormality. Remote bilateral  occipital and RIGHT posterior parietal infarcts. Chronic small  vessel white matter ischemic changes.      SLP Plan  Other (Comment) (downgrade to puree due to worsening mentation)      Recommendations for follow up therapy are one component of a multi-disciplinary discharge planning process, led by the attending physician.  Recommendations may be updated based on patient status, additional functional criteria and insurance authorization.    Recommendations  Diet recommendations: Dysphagia 1 (puree);Nectar-thick liquid Liquids provided via: Cup Medication Administration: Crushed with puree (can take whole in puree if able) Supervision: Full supervision/cueing for compensatory strategies Compensations: Slow rate;Small sips/bites;Minimize environmental distractions Postural Changes and/or Swallow Maneuvers: Seated upright 90 degrees                Oral Care Recommendations: Oral care BID;Oral care before and after PO;Staff/trained caregiver to provide oral care Follow Up Recommendations: Skilled nursing-short term rehab (<3 hours/day) Assistance recommended at discharge: Frequent or constant Supervision/Assistance SLP Visit Diagnosis: Dysphagia, oropharyngeal  phase (R13.12) Plan: Other (Comment)  (downgrade to puree due to worsening mentation)       GO              Rondel Baton, MS, CCC-SLP Speech-Language Pathologist   Arlana Lindau  11/12/2021, 11:28 AM

## 2021-11-12 NOTE — Progress Notes (Signed)
[facility]  - [place] at [facility]   PATIENT NAME: Victor Daniels    MR#:  [id number]  DATE OF BIRTH:  06-Jul-1946  SUBJECTIVE:   patient keeps his eyes closed.No fever. Hurts everywhere. No family in the room No new issues per RN REVIEW OF SYSTEMS:   Review of Systems  Constitutional:  Negative for chills, fever and weight loss.  HENT:  Negative for ear discharge, ear pain and nosebleeds.   Eyes:  Negative for blurred vision, pain and discharge.  Respiratory:  Positive for shortness of breath. Negative for sputum production, wheezing and stridor.   Cardiovascular:  Negative for chest pain, palpitations, orthopnea and PND.  Gastrointestinal:  Negative for abdominal pain, diarrhea, nausea and vomiting.  Genitourinary:  Negative for frequency and urgency.  Musculoskeletal:  Positive for back pain and joint pain.  Neurological:  Negative for sensory change, speech change, focal weakness and weakness.  Psychiatric/Behavioral:  Negative for depression and hallucinations. The patient is not nervous/anxious.   Tolerating Diet: yes Tolerating PT: rehab  DRUG ALLERGIES:   Allergies  Allergen Reactions   Clonidine Derivatives     Reaction unknown   Sulfa Antibiotics     Reaction unknown    VITALS:  Blood pressure (!) 148/83, pulse 93, temperature 97.9 F (36.6 C), temperature source Oral, resp. rate 18, height 6' (1.829 m), weight 117.4 kg, SpO2 96 %.  PHYSICAL EXAMINATION:   Physical Exam  GENERAL:  75 y.o.-year-old patient lying in the bed with no acute distress. Disheveled,obesity HEENT: Head atraumatic, normocephalic. Oropharynx and nasopharynx clear.  LUNGS: decreased breath sounds bilaterally, no wheezing, rales, rhonchi. No use of accessory muscles of respiration.  CARDIOVASCULAR: S1, S2 normal. No murmurs, rubs, or gallops. tachycardia ABDOMEN: Soft, nontender, nondistended. Bowel sounds present. No organomegaly or mass.  EXTREMITIES: LE  dressing+ NEUROLOGIC: nonfocal PSYCHIATRIC:  patient is alert SKIN:as above  LABORATORY PANEL:  CBC Recent Labs  Lab 11/12/21 0405  WBC 20.3*  HGB 10.1*  HCT 32.6*  PLT 268     Chemistries  Recent Labs  Lab 10/31/2021 0236 11/09/21 0721 11/06/2021 0536 11/11/21 0803  NA 133* 139  --   --   K 3.5 3.6  --   --   CL 89* 93*  --   --   CO2 34* 37*  --   --   GLUCOSE 152* 136*  --   --   BUN 103* 92*  --   --   CREATININE 2.51* 1.82*   < > 1.48*  CALCIUM 8.5* 8.7*  --   --   MG 2.0  --   --   --   AST 21  --   --   --   ALT 15  --   --   --   ALKPHOS 164*  --   --   --   BILITOT 2.3*  --   --   --    < > = values in this interval not displayed.    Cardiac Enzymes No results for input(s): TROPONINI in the last 168 hours. RADIOLOGY:  CT SHOULDER RIGHT W CONTRAST  Result Date: 11/12/2021 CLINICAL DATA:  Shoulder pain, septic arthritis suspected EXAM: CT OF THE UPPER RIGHT EXTREMITY WITH CONTRAST TECHNIQUE: Multidetector CT imaging of the upper right extremity was performed according to the standard protocol following intravenous contrast administration. CONTRAST:  24mL OMNIPAQUE IOHEXOL 300 MG/ML  SOLN COMPARISON:  None. FINDINGS: Bones/Joint/Cartilage Severe degenerative changes of the glenohumeral joint with extensive  juxta-articular subchondral sclerosis, cysts, marginal osteophytes and mild flattening of the glenoid and humeral head. No definite destructive bone lesion identified. Moderate degenerative changes of the acromioclavicular joint with subchondral cysts and sclerosis with small marginal osteophytes. Old fracture deformity of the humeral diaphysis partially visualized. Evidence of shoulder joint effusion. Ligaments Suboptimally assessed by CT. Muscles and Tendons Suboptimally assessed by CT.  No obvious significant abnormalities. Soft tissues Small right pleural effusion. Chronic coarse pleural calcifications on the right. A few pulmonary nodules identified measuring up  to 8 mm in the right middle lobe. A few pleural based consolidative densities scattered throughout the right lung measuring up to 1.5 cm in the lower lobe. IMPRESSION: 1. No acute osseous abnormality identified. Severe degenerative changes of the shoulder as described. 2. Shoulder joint effusion. Could be reactive secondary to arthritis or could also be seen with septic arthritis, correlate clinically and consider aspiration and sampling if indicated. 3. Several findings in the right lung including small pleural effusion, pleural calcifications, pulmonary nodules. Follow-up chest CT recommended in 3-6 months. Electronically Signed   By: Ofilia Neas M.D.   On: 11/12/2021 10:05   ASSESSMENT AND PLAN:    YUSSEF VONASEK is a 75 y.o. male with medical history significant for diabetes mellitus with complications of stage III chronic kidney disease, COPD with chronic respiratory failure on 2.5 L of oxygen continuous, chronic diastolic heart failure with last known LVEF of 55% from 2021, history of CVA, chronic venous stasis ulceration and dermatitis who presents to the ER for the second time in 2 days with complaints of pain all over but mostly in his lower extremities. Patient noted to have bilateral lower extremity wounds (Left > right) with drainage and foul odor.  Sepsis secondary to MRSA pneumonia, left sternoclavicular joint septic arthritis, Left Pectoralis muscles infection/abscess skin infection both lower extremity Possible Septic emboli -- presented with altered mental status, acute renal failure, elevated white count, tachycardia -- appreciate ID consultation with Dr. Delaine Lame. Continue IV vancomycin -- monitor fever and white count -- patient will need TEE-- message sent to cardiology --11/17-- spoke with Conley Rolls who was a neighbor and POA regarding ultrasound-guided left sternal clavicular/pectoral muscle abscess drained by IR and TEE-- she understands risk and complications  and wants the procedure done.  --UC -MRSA --BC-- MR Staph aureus --11/18--WC from left sternoclavicular joint --Staph aureus --now on IV daptomycin and Doxycycline --11/19 CT right shoulder severe DJD with some effusion Speech downgraded diet to pureed with nectar thick  Leucocytosis --came in with 20.5K--18.4--18.6--21--20.3  Bilateral lower extremity chronic venous stasis and edema with chronic wounds cellulitis Scrotal edema -- wound consult for dressing changes -- on abxs -- ultrasound Doppler negative for DVT  Diabetes with stage III chronic kidney disease -- sliding scale insulin -- carb control diet  acute on chronic kidney disease stage III Left Nephrectomy -- patient came in with creatinine of 4.28 in the setting of sepsis -- IV fluids--- creatinine down to 1.6 (4.28)--1.4 -- baseline creatinine 1.6 --US renal --no acute abnormality  atrial fibrillation chronic -- continue Xarelto (holding for procedure)  COPD with chronic respiratory failure -- continue nebulizer and bronchodilator therapy  chronic diastolic dysfunction --Stable --Not acutely exacerbated --Hold torsemide, spironolactone, metolazone and Cozaar due to worsening renal function  Failure to thrive/unable to care for self/weakness ?cognitive decline --TOC for d/c planning  Palliative care to see pt--overall poor prognosis longterm    Procedures:TEE, CT guided drainage of left sternoclavicular abscess Family communication :Clinical cytogeneticist  Lilia Pro (POA) Consults :ID, [facility] cards CODE STATUS: FULL DVT Prophylaxis : Level of care: Progressive Status is: Inpatient  Remains inpatient appropriate because: M RSA sepsis rx        TOTAL TIME TAKING CARE OF THIS PATIENT: 35 minutes.  >50% time spent on counselling and coordination of care  Note: This dictation was prepared with Dragon dictation along with smaller phrase technology. Any transcriptional errors that result from this process are  unintentional.  Fritzi Mandes M.D    [facility]   CC: Primary care physician; Crecencio Mc, MD Patient ID: Edson Snowball, male   DOB: 07/20/1946, 75 y.o.   MRN: [id number]

## 2021-11-13 DIAGNOSIS — L03116 Cellulitis of left lower limb: Secondary | ICD-10-CM | POA: Diagnosis not present

## 2021-11-13 DIAGNOSIS — N183 Chronic kidney disease, stage 3 unspecified: Secondary | ICD-10-CM | POA: Diagnosis not present

## 2021-11-13 DIAGNOSIS — E870 Hyperosmolality and hypernatremia: Secondary | ICD-10-CM

## 2021-11-13 DIAGNOSIS — J15212 Pneumonia due to Methicillin resistant Staphylococcus aureus: Secondary | ICD-10-CM | POA: Diagnosis not present

## 2021-11-13 DIAGNOSIS — R7881 Bacteremia: Secondary | ICD-10-CM | POA: Diagnosis not present

## 2021-11-13 LAB — BASIC METABOLIC PANEL
Anion gap: 10 (ref 5–15)
BUN: 118 mg/dL — ABNORMAL HIGH (ref 8–23)
CO2: 41 mmol/L — ABNORMAL HIGH (ref 22–32)
Calcium: 9.2 mg/dL (ref 8.9–10.3)
Chloride: 107 mmol/L (ref 98–111)
Creatinine, Ser: 1.8 mg/dL — ABNORMAL HIGH (ref 0.61–1.24)
GFR, Estimated: 39 mL/min — ABNORMAL LOW (ref >60)
Glucose, Bld: 158 mg/dL — ABNORMAL HIGH (ref 70–99)
Potassium: 3.5 mmol/L (ref 3.5–5.1)
Sodium: 158 mmol/L — ABNORMAL HIGH (ref 135–145)

## 2021-11-13 LAB — GLUCOSE, CAPILLARY
Glucose-Capillary: 133 mg/dL — ABNORMAL HIGH (ref 70–99)
Glucose-Capillary: 132 mg/dL — ABNORMAL HIGH (ref 70–99)
Glucose-Capillary: 146 mg/dL — ABNORMAL HIGH (ref 70–99)
Glucose-Capillary: 214 mg/dL — ABNORMAL HIGH (ref 70–99)

## 2021-11-13 LAB — SODIUM: Sodium: 160 mmol/L — ABNORMAL HIGH (ref 135–145)

## 2021-11-13 MED ORDER — DEXTROSE 5 % IV SOLN: INTRAVENOUS | Status: DC

## 2021-11-13 NOTE — Progress Notes (Signed)
Physical Therapy Treatment Patient Details Name: Victor Daniels MRN: [id number] DOB: 15-Dec-1946 Today's Date: 11/13/2021   History of Present Illness Pt is a 75 y.o. male with medical history significant for diabetes mellitus with complications of stage III chronic kidney disease, CAD, COPD with chronic respiratory failure on 2.5 L of oxygen continuous, chronic diastolic heart failure with last known LVEF of 55% from 2021, history of CVA, chronic venous stasis ulceration and dermatitis  who presents to the ER for the second time in 2 days with complaints of pain all over but mostly in his lower extremities. MD assessment includes: sepsis, pneumonia, left sternal clavicular joint septic arthritis, left pectoralis muscles infection/abscess, skin infection both lower extremity, possible septic emboli, bilateral lower extremity chronic venous stasis and edema with chronic wounds cellulitis, scrotal edema, and acute on chronic kidney disease stage III.    PT Comments    Poor tolerance for therapy this date. Pt yelling out and repeating self over and over about pain throughout body, however appears comfortable when not attempting to move. AAROM of B LE performed with poor tolerance. Encouraged pressure relief by rolling and pillow repositioning, however pt very resistant. B mitts in place. Unable to progress towards goals this date. Will continue to attempt. Anticipate +2 and total assist for mobility.   Recommendations for follow up therapy are one component of a multi-disciplinary discharge planning process, led by the attending physician.  Recommendations may be updated based on patient status, additional functional criteria and insurance authorization.  Follow Up Recommendations  Skilled nursing-short term rehab (<3 hours/day)     Assistance Recommended at Discharge Frequent or constant Supervision/Assistance  Equipment Recommendations  None recommended by PT    Recommendations for Other  Services       Precautions / Restrictions Precautions Precautions: Fall Restrictions Weight Bearing Restrictions: No     Mobility  Bed Mobility Overal bed mobility: Needs Assistance Bed Mobility: Rolling Rolling: Total assist         General bed mobility comments: attempted rolling, however pt resisted and reports pain all over body and wherever therapist places hand to support rolling. Further bed mobility deferred as pt unable to tolerate    Transfers                        Ambulation/Gait                   Stairs             Wheelchair Mobility    Modified Rankin (Stroke Patients Only)       Balance                                            Cognition Arousal/Alertness: Awake/alert Behavior During Therapy: WFL for tasks assessed/performed Overall Cognitive Status: No family/caregiver present to determine baseline cognitive functioning                                 General Comments: pt keeps eyes closed throughout session, however able to hold conversation. Pt confused to date/situation        Exercises Other Exercises Other Exercises: supine ther-ex performed including AAROM of B LE with SLRs and SAQ. Very limited tolerance approx 5-8 reps of each with limited participation. Attempted ROM  on UE, however pt resistant    General Comments        Pertinent Vitals/Pain Pain Assessment: Faces Faces Pain Scale: Hurts whole lot Pain Location: Whole body Pain Descriptors / Indicators: Discomfort;Aching;Grimacing Pain Intervention(s): Limited activity within patient's tolerance;Repositioned    Home Living                          Prior Function            PT Goals (current goals can now be found in the care plan section) Acute Rehab PT Goals Patient Stated Goal: To go home PT Goal Formulation: With patient Time For Goal Achievement: 11/22/21 Potential to Achieve Goals:  Fair Progress towards PT goals: Progressing toward goals    Frequency    Min 2X/week      PT Plan Current plan remains appropriate    Co-evaluation              AM-PAC PT "6 Clicks" Mobility   Outcome Measure  Help needed turning from your back to your side while in a flat bed without using bedrails?: Total Help needed moving from lying on your back to sitting on the side of a flat bed without using bedrails?: Total Help needed moving to and from a bed to a chair (including a wheelchair)?: Total Help needed standing up from a chair using your arms (e.g., wheelchair or bedside chair)?: Total Help needed to walk in hospital room?: Total Help needed climbing 3-5 steps with a railing? : Total 6 Click Score: 6    End of Session Equipment Utilized During Treatment: Oxygen Activity Tolerance: Patient limited by pain;Patient limited by fatigue Patient left: in bed;with bed alarm set Nurse Communication: Mobility status PT Visit Diagnosis: Muscle weakness (generalized) (M62.81);Difficulty in walking, not elsewhere classified (R26.2);Pain Pain - Right/Left:  (both) Pain - part of body:  (whole body)     Time: [id number] PT Time Calculation (min) (ACUTE ONLY): 10 min  Charges:  $Therapeutic Exercise: 8-22 mins                     Greggory Stallion, PT, DPT [phone]    [name] 11/13/2021, 10:33 AM

## 2021-11-13 NOTE — Progress Notes (Addendum)
[facility]  - [place] at [facility]   PATIENT NAME: Victor Daniels    MR#:  [id number]  DATE OF BIRTH:  Jun 13, 1946  SUBJECTIVE:   patient keeps his eyes closed.No fever. Hurts everywhere. No family in the room Not working much with PT  REVIEW OF SYSTEMS:   Review of Systems  Constitutional:  Negative for chills, fever and weight loss.  HENT:  Negative for ear discharge, ear pain and nosebleeds.   Eyes:  Negative for blurred vision, pain and discharge.  Respiratory:  Positive for shortness of breath. Negative for sputum production, wheezing and stridor.   Cardiovascular:  Negative for chest pain, palpitations, orthopnea and PND.  Gastrointestinal:  Negative for abdominal pain, diarrhea, nausea and vomiting.  Genitourinary:  Negative for frequency and urgency.  Musculoskeletal:  Positive for back pain and joint pain.  Neurological:  Negative for sensory change, speech change, focal weakness and weakness.  Psychiatric/Behavioral:  Negative for depression and hallucinations. The patient is not nervous/anxious.   Tolerating Diet: yes Tolerating PT: rehab  DRUG ALLERGIES:   Allergies  Allergen Reactions   Clonidine Derivatives     Reaction unknown   Sulfa Antibiotics     Reaction unknown    VITALS:  Blood pressure (!) 145/81, pulse 91, temperature 98.2 F (36.8 C), resp. rate 18, height 6' (1.829 m), weight 117.4 kg, SpO2 99 %.  PHYSICAL EXAMINATION:   Physical Exam  GENERAL:  75 y.o.-year-old patient lying in the bed with no acute distress. Disheveled,obesity HEENT: Head atraumatic, normocephalic. Oropharynx and nasopharynx clear.  LUNGS: decreased breath sounds bilaterally, no wheezing, rales, rhonchi. No use of accessory muscles of respiration.  CARDIOVASCULAR: S1, S2 normal. No murmurs, rubs, or gallops. tachycardia ABDOMEN: Soft, nontender, nondistended. Bowel sounds present. No organomegaly or mass.  EXTREMITIES: LE dressing+ NEUROLOGIC:  nonfocal PSYCHIATRIC:  patient is alert SKIN:as above  LABORATORY PANEL:  CBC Recent Labs  Lab 11/12/21 0405  WBC 20.3*  HGB 10.1*  HCT 32.6*  PLT 268     Chemistries  Recent Labs  Lab 11/15/2021 0236 11/09/21 0721 11/13/21 0526  NA 133*   < > 158*  K 3.5   < > 3.5  CL 89*   < > 107  CO2 34*   < > 41*  GLUCOSE 152*   < > 158*  BUN 103*   < > 118*  CREATININE 2.51*   < > 1.80*  CALCIUM 8.5*   < > 9.2  MG 2.0  --   --   AST 21  --   --   ALT 15  --   --   ALKPHOS 164*  --   --   BILITOT 2.3*  --   --    < > = values in this interval not displayed.    Cardiac Enzymes No results for input(s): TROPONINI in the last 168 hours. RADIOLOGY:  CT SHOULDER RIGHT W CONTRAST  Result Date: 11/12/2021 CLINICAL DATA:  Shoulder pain, septic arthritis suspected EXAM: CT OF THE UPPER RIGHT EXTREMITY WITH CONTRAST TECHNIQUE: Multidetector CT imaging of the upper right extremity was performed according to the standard protocol following intravenous contrast administration. CONTRAST:  26mL OMNIPAQUE IOHEXOL 300 MG/ML  SOLN COMPARISON:  None. FINDINGS: Bones/Joint/Cartilage Severe degenerative changes of the glenohumeral joint with extensive juxta-articular subchondral sclerosis, cysts, marginal osteophytes and mild flattening of the glenoid and humeral head. No definite destructive bone lesion identified. Moderate degenerative changes of the acromioclavicular joint with subchondral cysts and sclerosis with  small marginal osteophytes. Old fracture deformity of the humeral diaphysis partially visualized. Evidence of shoulder joint effusion. Ligaments Suboptimally assessed by CT. Muscles and Tendons Suboptimally assessed by CT.  No obvious significant abnormalities. Soft tissues Small right pleural effusion. Chronic coarse pleural calcifications on the right. A few pulmonary nodules identified measuring up to 8 mm in the right middle lobe. A few pleural based consolidative densities scattered  throughout the right lung measuring up to 1.5 cm in the lower lobe. IMPRESSION: 1. No acute osseous abnormality identified. Severe degenerative changes of the shoulder as described. 2. Shoulder joint effusion. Could be reactive secondary to arthritis or could also be seen with septic arthritis, correlate clinically and consider aspiration and sampling if indicated. 3. Several findings in the right lung including small pleural effusion, pleural calcifications, pulmonary nodules. Follow-up chest CT recommended in 3-6 months. Electronically Signed   By: Ofilia Neas M.D.   On: 11/12/2021 10:05   ASSESSMENT AND PLAN:    Victor Daniels is a 75 y.o. male with medical history significant for diabetes mellitus with complications of stage III chronic kidney disease, COPD with chronic respiratory failure on 2.5 L of oxygen continuous, chronic diastolic heart failure with last known LVEF of 55% from 2021, history of CVA, chronic venous stasis ulceration and dermatitis who presents to the ER for the second time in 2 days with complaints of pain all over but mostly in his lower extremities. Patient noted to have bilateral lower extremity wounds (Left > right) with drainage and foul odor.  Sepsis secondary to MRSA pneumonia, left sternoclavicular joint septic arthritis, Left Pectoralis muscles infection/abscess skin infection both lower extremity Possible Septic emboli -- presented with altered mental status, acute renal failure, elevated white count, tachycardia -- appreciate ID consultation with Dr. Delaine Lame. Continue IV vancomycin -- monitor fever and white count -- patient will need TEE-- message sent to cardiology --11/17-- spoke with Conley Rolls who was a neighbor and POA regarding ultrasound-guided left sternal clavicular/pectoral muscle abscess drained by IR and TEE-- she understands risk and complications and wants the procedure done.  --UC -MRSA --BC-- MR Staph aureus --11/18--WC from left  sternoclavicular joint --Staph aureus --now on IV daptomycin and Doxycycline --11/19 CT right shoulder severe DJD with some effusion Speech downgraded diet to pureed with nectar thick --11/20-- sodium 158--seems pt is on nectar thick and poor po intake. Start d5W  Hypernatremia --pt on nectar thick, poor po intake --Na 158 --start IV D5W  Leucocytosis --came in with 20.5K--18.4--18.6--21--20.3  Bilateral lower extremity chronic venous stasis and edema with chronic wounds cellulitis Scrotal edema -- wound consult for dressing changes -- on abxs -- ultrasound Doppler negative for DVT  Diabetes with stage III chronic kidney disease -- sliding scale insulin -- carb control diet  acute on chronic kidney disease stage III Left Nephrectomy -- patient came in with creatinine of 4.28 in the setting of sepsis -- IV fluids--- creatinine down to 1.6 (4.28)--1.4 -- baseline creatinine 1.6 --US renal --no acute abnormality  atrial fibrillation chronic -- continue Xarelto (holding for procedure)  COPD with chronic respiratory failure -- continue nebulizer and bronchodilator therapy  chronic diastolic dysfunction --Stable --Not acutely exacerbated --Hold torsemide, spironolactone, metolazone and Cozaar due to worsening renal function  Failure to thrive/unable to care for self/weakness ?cognitive decline --TOC for d/c planning  Palliative care to see pt--overall poor prognosis longterm    Procedures:TEE, CT guided drainage of left sternoclavicular abscess Family communication :Conley Rolls (POA) Consults :ID, [facility] cards  CODE STATUS: FULL DVT Prophylaxis : Level of care: Progressive Status is: Inpatient  Remains inpatient appropriate because: M RSA sepsis rx        TOTAL TIME TAKING CARE OF THIS PATIENT: 35 minutes.  >50% time spent on counselling and coordination of care  Note: This dictation was prepared with Dragon dictation along with smaller phrase technology.  Any transcriptional errors that result from this process are unintentional.  Fritzi Mandes M.D    [facility]   CC: Primary care physician; Crecencio Mc, MD Patient ID: Victor Daniels, male   DOB: April 08, 1946, 75 y.o.   MRN: [id number]

## 2021-11-13 NOTE — Evaluation (Signed)
Occupational Therapy Evaluation Patient Details Name: Victor Daniels MRN: [id number] DOB: 06-22-1946 Today's Date: 11/13/2021   History of Present Illness Pt is a 75 y.o. male with medical history significant for diabetes mellitus with complications of stage III chronic kidney disease, CAD, COPD with chronic respiratory failure on 2.5 L of oxygen continuous, chronic diastolic heart failure with last known LVEF of 55% from 2021, history of CVA, chronic venous stasis ulceration and dermatitis  who presents to the ER for the second time in 2 days with complaints of pain all over but mostly in his lower extremities. MD assessment includes: sepsis, pneumonia, left sternal clavicular joint septic arthritis, left pectoralis muscles infection/abscess, skin infection both lower extremity, possible septic emboli, bilateral lower extremity chronic venous stasis and edema with chronic wounds cellulitis, scrotal edema, and acute on chronic kidney disease stage III.   Clinical Impression   Victor Daniels presents with generalized weakness, reduced endurance, b/l LE edema and weeping wounds, skin tone tinged yellow-orange, and 10/10 pain. He has general awareness that he is in "a hospital." He is unable to provide any information regarding his PLOF and living function. Per chart review and earlier acquaintance with this pt, aware that he lives alone, receiving occasional assistance from neighbors. Victor Daniels wife used to assist him with IADLs as well as LB dressing, but she died earlier this year. During today's evaluation, pt yells out in pain with attempts at bed mobility, becoming particularly agitated with UE movement. He is able to provide slight assistance in elevating LE to reposition pillows for comfort, other than that resists all attempts at bed mobility. Pt's only self-initiated movement is to attempt to remove hand mitts. Recommend ongoing OT while hospitalized, with DC to SNF.      Recommendations for follow up therapy are one component of a multi-disciplinary discharge planning process, led by the attending physician.  Recommendations may be updated based on patient status, additional functional criteria and insurance authorization.   Follow Up Recommendations  Skilled nursing-short term rehab (<3 hours/day)    Assistance Recommended at Discharge Frequent or constant Supervision/Assistance  Functional Status Assessment  Patient has had a recent decline in their functional status and/or demonstrates limited ability to make significant improvements in function in a reasonable and predictable amount of time  Equipment Recommendations  None recommended by OT    Recommendations for Other Services       Precautions / Restrictions Precautions Precautions: Fall Precaution Comments: pulling at IVs, wearing hand mitts Restrictions Weight Bearing Restrictions: No      Mobility Bed Mobility Overal bed mobility: Needs Assistance Bed Mobility: Rolling;Supine to Sit;Sit to Supine Rolling: Total assist   Supine to sit: Total assist Sit to supine: Total assist   General bed mobility comments: Pt requires Total A for bed mobility, yelling out in pain with any movement.    Transfers Overall transfer level: Needs assistance                 General transfer comment: Unable      Balance Overall balance assessment: Needs assistance   Sitting balance-Leahy Scale: Zero Sitting balance - Comments: Pt unable to come into sitting     Standing balance-Leahy Scale: Zero Standing balance comment: unable                           ADL either performed or assessed with clinical judgement   ADL Overall ADL's : Needs assistance/impaired  Functional mobility during ADLs: Maximal assistance;+2 for physical assistance General ADL Comments: Anticipate pt would be Max A +2 for seated/OOB fxl mobility      Vision   Additional Comments: Rt eye is swollen, almost closed, and with discharge     Perception     Praxis      Pertinent Vitals/Pain Pain Score: 10-Worst pain ever Pain Location: Whole body Pain Descriptors / Indicators: Discomfort;Aching;Grimacing;Guarding Pain Intervention(s): Limited activity within patient's tolerance;Repositioned     Hand Dominance Right   Extremity/Trunk Assessment Upper Extremity Assessment Upper Extremity Assessment: Generalized weakness   Lower Extremity Assessment Lower Extremity Assessment: Generalized weakness       Communication Communication Communication: HOH   Cognition Arousal/Alertness: Awake/alert Behavior During Therapy: Anxious;Flat affect Overall Cognitive Status: No family/caregiver present to determine baseline cognitive functioning                                 General Comments: Pt aware he is at "hospital," but unable to name. Not oriented to month or year. Unable to recall information presented to him during session.     General Comments  edema + weeping wounds on b/l LE; pt's skin tone yellowish-orange    Exercises Other Exercises Other Exercises: supine PROM and AAROM therex, w/ pt yelling out in pain with any limited movement   Shoulder Instructions      Home Living Family/patient expects to be discharged to:: Private residence Living Arrangements: Alone Available Help at Discharge: Available PRN/intermittently;Neighbor Type of Home: House Home Access: Stairs to enter [employer] of Steps: 1   Home Layout: One level           Bathroom Accessibility: No   Home Equipment: Agricultural consultant (2 wheels);Rollator (4 wheels);Cane - single point;Tub bench          Prior Functioning/Environment Prior Level of Function : Patient poor historian/Family not available;Needs assist             Mobility Comments: Pt reports not using any DME ADLs Comments: Pt unable to provide  history. Per chart review, pt receives asst from neighbors for ADL, IADL        OT Problem List: Decreased strength;Impaired balance (sitting and/or standing);Pain;Decreased cognition;Decreased range of motion;Decreased activity tolerance;Impaired UE functional use;Impaired sensation      OT Treatment/Interventions: Self-care/ADL training;DME and/or AE instruction;Therapeutic activities;Balance training;Therapeutic exercise;Patient/family education    OT Goals(Current goals can be found in the care plan section) Acute Rehab OT Goals Patient Stated Goal: to get hand mitts off OT Goal Formulation: With patient Time For Goal Achievement: 11/27/21 Potential to Achieve Goals: Good ADL Goals Pt Will Perform Grooming: sitting;with min assist Pt Will Perform Upper Body Dressing: with min assist;sitting Pt/caregiver will Perform Home Exercise Program: Increased ROM;Increased strength (in sitting or supine, to increase strength, endurance, and assist with pain mgmt)  OT Frequency: Min 2X/week   Barriers to D/C: Decreased caregiver support          Co-evaluation              AM-PAC OT "6 Clicks" Daily Activity     Outcome Measure Help from another person eating meals?: A Lot Help from another person taking care of personal grooming?: Total Help from another person toileting, which includes using toliet, bedpan, or urinal?: Total Help from another person bathing (including washing, rinsing, drying)?: Total Help from another person to put on and taking off regular upper  body clothing?: Total Help from another person to put on and taking off regular lower body clothing?: Total 6 Click Score: 7   End of Session    Activity Tolerance: Patient limited by pain;Patient limited by lethargy Patient left: in bed;with call bell/phone within reach;with bed alarm set  OT Visit Diagnosis: Unsteadiness on feet (R26.81);Muscle weakness (generalized) (M62.81);Pain                Time:  0207-0240 OT Time Calculation (min): 33 min Charges:  OT General Charges $OT Visit: 1 Visit OT Evaluation $OT Eval Moderate Complexity: 1 Mod OT Treatments $Self Care/Home Management : 23-37 mins Josiah Lobo, PhD, MS, OTR/L 11/13/21, 3:04 PM

## 2021-11-14 DIAGNOSIS — Z515 Encounter for palliative care: Secondary | ICD-10-CM

## 2021-11-14 DIAGNOSIS — J15212 Pneumonia due to Methicillin resistant Staphylococcus aureus: Secondary | ICD-10-CM | POA: Diagnosis not present

## 2021-11-14 DIAGNOSIS — Z7189 Other specified counseling: Secondary | ICD-10-CM

## 2021-11-14 DIAGNOSIS — J431 Panlobular emphysema: Secondary | ICD-10-CM | POA: Diagnosis not present

## 2021-11-14 DIAGNOSIS — J189 Pneumonia, unspecified organism: Secondary | ICD-10-CM | POA: Diagnosis not present

## 2021-11-14 DIAGNOSIS — I5032 Chronic diastolic (congestive) heart failure: Secondary | ICD-10-CM

## 2021-11-14 DIAGNOSIS — L03116 Cellulitis of left lower limb: Secondary | ICD-10-CM | POA: Diagnosis not present

## 2021-11-14 DIAGNOSIS — N183 Chronic kidney disease, stage 3 unspecified: Secondary | ICD-10-CM | POA: Diagnosis not present

## 2021-11-14 DIAGNOSIS — R7881 Bacteremia: Secondary | ICD-10-CM | POA: Diagnosis not present

## 2021-11-14 DIAGNOSIS — Z66 Do not resuscitate: Secondary | ICD-10-CM

## 2021-11-14 LAB — CBC
HCT: 33.8 % — ABNORMAL LOW (ref 39.0–52.0)
Hemoglobin: 10 g/dL — ABNORMAL LOW (ref 13.0–17.0)
MCH: 24.9 pg — ABNORMAL LOW (ref 26.0–34.0)
MCHC: 29.6 g/dL — ABNORMAL LOW (ref 30.0–36.0)
MCV: 84.1 fL (ref 80.0–100.0)
Platelets: 229 10*3/uL (ref 150–400)
RBC: 4.02 MIL/uL — ABNORMAL LOW (ref 4.22–5.81)
RDW: 18.5 % — ABNORMAL HIGH (ref 11.5–15.5)
WBC: 16.5 10*3/uL — ABNORMAL HIGH (ref 4.0–10.5)
nRBC: 0 % (ref 0.0–0.2)

## 2021-11-14 LAB — BASIC METABOLIC PANEL
Anion gap: 10 (ref 5–15)
BUN: 111 mg/dL — ABNORMAL HIGH (ref 8–23)
CO2: 40 mmol/L — ABNORMAL HIGH (ref 22–32)
Calcium: 9 mg/dL (ref 8.9–10.3)
Chloride: 109 mmol/L (ref 98–111)
Creatinine, Ser: 1.96 mg/dL — ABNORMAL HIGH (ref 0.61–1.24)
GFR, Estimated: 35 mL/min — ABNORMAL LOW (ref >60)
Glucose, Bld: 161 mg/dL — ABNORMAL HIGH (ref 70–99)
Potassium: 3.8 mmol/L (ref 3.5–5.1)
Sodium: 159 mmol/L — ABNORMAL HIGH (ref 135–145)

## 2021-11-14 LAB — CULTURE, BLOOD (ROUTINE X 2)
Culture: NO GROWTH
Special Requests: ADEQUATE

## 2021-11-14 LAB — GLUCOSE, CAPILLARY
Glucose-Capillary: 191 mg/dL — ABNORMAL HIGH (ref 70–99)
Glucose-Capillary: 131 mg/dL — ABNORMAL HIGH (ref 70–99)

## 2021-11-14 LAB — SODIUM: Sodium: 159 mmol/L — ABNORMAL HIGH (ref 135–145)

## 2021-11-14 MED ORDER — GLYCOPYRROLATE 0.2 MG/ML IJ SOLN
0.2000 mg | INTRAMUSCULAR | Status: DC | PRN
  Filled 2021-11-14: qty 1

## 2021-11-14 MED ORDER — MORPHINE SULFATE (PF) 2 MG/ML IV SOLN
1.0000 mg | INTRAVENOUS | Status: DC | PRN
  Administered 2021-11-14 – 2021-11-15 (×4): 1 mg via INTRAVENOUS
  Filled 2021-11-14 (×4): qty 1

## 2021-11-14 MED ORDER — POLYVINYL ALCOHOL 1.4 % OP SOLN
1.0000 [drp] | Freq: Four times a day (QID) | OPHTHALMIC | Status: DC | PRN
  Filled 2021-11-14: qty 15

## 2021-11-14 MED ORDER — ACETAMINOPHEN 325 MG PO TABS: 650.0000 mg | ORAL_TABLET | Freq: Four times a day (QID) | ORAL | Status: DC | PRN

## 2021-11-14 MED ORDER — OXYCODONE HCL 5 MG PO TABS: 15.0000 mg | ORAL_TABLET | Freq: Four times a day (QID) | ORAL | Status: DC | PRN

## 2021-11-14 MED ORDER — GLYCOPYRROLATE 1 MG PO TABS
1.0000 mg | ORAL_TABLET | ORAL | Status: DC | PRN
  Filled 2021-11-14: qty 1

## 2021-11-14 MED ORDER — SODIUM CHLORIDE 0.9% FLUSH: 3.0000 mL | INTRAVENOUS | Status: DC | PRN

## 2021-11-14 MED ORDER — HALOPERIDOL LACTATE 2 MG/ML PO CONC
0.5000 mg | ORAL | Status: DC | PRN
  Filled 2021-11-14: qty 0.3

## 2021-11-14 MED ORDER — HALOPERIDOL LACTATE 5 MG/ML IJ SOLN
0.5000 mg | INTRAMUSCULAR | Status: DC | PRN
  Administered 2021-11-14: 0.5 mg via INTRAVENOUS
  Filled 2021-11-14: qty 1

## 2021-11-14 MED ORDER — BLISTEX MEDICATED EX OINT
TOPICAL_OINTMENT | CUTANEOUS | Status: DC | PRN
  Filled 2021-11-14: qty 6.3

## 2021-11-14 MED ORDER — ACETAMINOPHEN 650 MG RE SUPP: 650.0000 mg | Freq: Four times a day (QID) | RECTAL | Status: DC | PRN

## 2021-11-14 MED ORDER — SODIUM CHLORIDE 0.9 % IV SOLN: 250.0000 mL | INTRAVENOUS | Status: DC | PRN

## 2021-11-14 MED ORDER — HALOPERIDOL 0.5 MG PO TABS
0.5000 mg | ORAL_TABLET | ORAL | Status: DC | PRN
  Filled 2021-11-14: qty 1

## 2021-11-14 MED ORDER — MORPHINE SULFATE (PF) 2 MG/ML IV SOLN
0.5000 mg | INTRAVENOUS | Status: DC | PRN
  Administered 2021-11-14: 0.5 mg via INTRAVENOUS
  Filled 2021-11-14: qty 1

## 2021-11-14 MED ORDER — SODIUM CHLORIDE 0.9% FLUSH
3.0000 mL | Freq: Two times a day (BID) | INTRAVENOUS | Status: DC
  Administered 2021-11-14 – 2021-11-15 (×3): 3 mL via INTRAVENOUS

## 2021-11-14 NOTE — Progress Notes (Signed)
SLP Cancellation Note  Patient Details Name: Victor Daniels MRN: [id number] DOB: April 18, 1946   Cancelled treatment:       Reason Eval/Treat Not Completed: Medical issues which prohibited therapy;Patient not medically ready (consulted MD; reviewed chart) Pt has had a decline in status per MD; poorly alert/responsive currently. ST services will Hold on po trials/tx today per MD and will monitor pt' status next 1-2 days. Aspiration precautinos.       Jerilynn Som, MS, CCC-SLP Speech Language Pathologist Rehab Services [phone] [facility] [place] 11/14/2021, 10:25 AM

## 2021-11-14 NOTE — Progress Notes (Signed)
While looking for pts cell phone a bottle of oxycodone was found, took meds to pharmacy.

## 2021-11-14 NOTE — Progress Notes (Signed)
Occupational Therapy Treatment Patient Details Name: Victor Daniels MRN: [id number] DOB: [birth year] Today's Date: 11/14/2021   History of present illness Pt is a 75 y.o. male with medical history significant for diabetes mellitus with complications of stage III chronic kidney disease, CAD, COPD with chronic respiratory failure on 2.5 L of oxygen continuous, chronic diastolic heart failure with last known LVEF of 55% from 2021, history of CVA, chronic venous stasis ulceration and dermatitis  who presents to the ER for the second time in 2 days with complaints of pain all over but mostly in his lower extremities. MD assessment includes: sepsis, pneumonia, left sternal clavicular joint septic arthritis, left pectoralis muscles infection/abscess, skin infection both lower extremity, possible septic emboli, bilateral lower extremity chronic venous stasis and edema with chronic wounds cellulitis, scrotal edema, and acute on chronic kidney disease stage III.   OT comments  Victor Daniels appeared to be less distressed than during yesterday's OT session. Today, he was able to participate very minimally in bed mobility, making small contralateral movements with UE for rolling, although could not follow directions to assist with changing gown, unable to assist in any way in threading UE through sleeves. He was able, however, to open his eyes when directed to do so. He had reduced edema on b/l Les, reduced swelling of R eye, and improved skin tone -- no longer yellow/orange. He continued to display signs of pain -- yelling, grimacing, guarding, but with a much reduced intensity than yesterday. Recommend consult with palliative care/hospice services.    Recommendations for follow up therapy are one component of a multi-disciplinary discharge planning process, led by the attending physician.  Recommendations may be updated based on patient status, additional functional criteria and insurance authorization.     Follow Up Recommendations  Skilled nursing-short term rehab (<3 hours/day)    Assistance Recommended at Discharge Frequent or constant Supervision/Assistance  Equipment Recommendations  None recommended by OT    Recommendations for Other Services Other (comment) (palliative care/hospice)    Precautions / Restrictions Precautions Precautions: Fall Precaution Comments: pulling at IVs, wearing hand mitts Restrictions Weight Bearing Restrictions: No       Mobility Bed Mobility Overal bed mobility: Needs Assistance Bed Mobility: Rolling Rolling: Max assist         General bed mobility comments: Pt participated very minimally in rolling in bed for linen change    Transfers                   General transfer comment: Unable     Balance Overall balance assessment: Needs assistance   Sitting balance-Victor Daniels: Zero Sitting balance - Comments: unable     Standing balance-Victor Daniels: Zero Standing balance comment: unable                           ADL either performed or assessed with clinical judgement   ADL Overall ADL's : Needs assistance/impaired                 Upper Body Dressing : Total assistance Upper Body Dressing Details (indicate cue type and reason): changing gown                 Functional mobility during ADLs: Maximal assistance;+2 for physical assistance General ADL Comments: Anticipate pt would be Max/Total A +2 for seated/OOB fxl mobility    Extremity/Trunk Assessment Upper Extremity Assessment Upper Extremity Assessment: Generalized weakness   Lower Extremity Assessment Lower Extremity  Assessment: Generalized weakness        Vision       Perception     Praxis      Cognition Arousal/Alertness: Lethargic Behavior During Therapy: Flat affect Overall Cognitive Status: No family/caregiver present to determine baseline cognitive functioning                                 General  Comments: Able to provide name, aware he is at "hospital" and "in [facility]"          Exercises Other Exercises Other Exercises: bed mobility w/ linen change; supine PROM and AAROM   Shoulder Instructions       General Comments      Pertinent Vitals/ Pain       Breathing: occasional labored breathing, short period of hyperventilation Negative Vocalization: occasional moan/groan, low speech, negative/disapproving quality Facial Expression: facial grimacing Body Language: tense, distressed pacing, fidgeting Consolability: distracted or reassured by voice/touch PAINAD Score: 6 Pain Location: Whole body Pain Intervention(s): Limited activity within patient's tolerance;Repositioned;Monitored during session  Home Living                                          Prior Functioning/Environment              Frequency  Min 2X/week        Progress Toward Goals  OT Goals(current goals can now be found in the care plan section)  Progress towards OT goals: Not progressing toward goals - comment  Acute Rehab OT Goals Patient Stated Goal: pt unable to state goal OT Goal Formulation: With patient Time For Goal Achievement: 11/27/21 Potential to Achieve Goals: Fair  Plan Discharge plan remains appropriate;Frequency remains appropriate    Co-evaluation                 AM-PAC OT "6 Clicks" Daily Activity     Outcome Measure   Help from another person eating meals?: A Lot Help from another person taking care of personal grooming?: Total Help from another person toileting, which includes using toliet, bedpan, or urinal?: Total Help from another person bathing (including washing, rinsing, drying)?: Total Help from another person to put on and taking off regular upper body clothing?: Total Help from another person to put on and taking off regular lower body clothing?: Total 6 Click Score: 7    End of Session    OT Visit Diagnosis: Unsteadiness on  feet (R26.81);Muscle weakness (generalized) (M62.81);Pain   Activity Tolerance Patient limited by pain;Patient limited by lethargy   Patient Left in bed;with call bell/phone within reach;with bed alarm set;with nursing/sitter in room   Nurse Communication          Time: 0932-6712 OT Time Calculation (min): 13 min  Charges: OT General Charges $OT Visit: 1 Visit OT Treatments $Self Care/Home Management : 8-22 mins  Latina Craver, PhD, MS, OTR/L 11/14/21, 2:18 PM

## 2021-11-14 NOTE — Progress Notes (Addendum)
Patient ID: AMEDIO BOWLBY, male   DOB: [birth year], 75 y.o.   MRN: [id number] Spoke with Doran Stabler Neighbor and POC regarding pt's overall poor prognosis and declining due to severe Disseminated MRSA sepsis. Per Gigi Gin pt has no contact with his family (sister). Peggy and Whitney Post are the only POC Discussed Code status and Gigi Gin mentioned that Ed had told her before that he would not want any aggressive measures coz that is not how he wants to live. Pt is DNR/DNI according to my conversation with Peggy--she voiced understanding. Discussed comofrt care and hospice also if he continues to decline. D/w Palliative care Bronson Curb, NP   1:50 pm informed by palliative care nurse practitioner who spoke with patient's sister and informed of patient's poor prognosis. Patient is now transition to comfort care after discussions were held with Doran Stabler by palliative care.

## 2021-11-14 NOTE — Consult Note (Signed)
[facility] Consult Note: November 14, 2021    Date of Admission:  24-Nov-2021           Reason for Consult:  Hyponatremia   Referring Provider: Enedina Finner, MD Primary Care Provider: [name], MD   History of Presenting Illness:  Victor Daniels is a 75 y.o. male  With multiple medical problems of diabetes, chronic kidney disease, COPD, chronic respiratory failure requiring 2.5 L continuous oxygen, chronic diastolic CHF with last known EF 55% from 2021, history of stroke, chronic venous stasis ulcers and dermatitis.  He presented to the emergency room for complaints of pain all over especially in his legs. He was found to have bilateral lower extremity wounds with drainage and foul odor. He was diagnosed with sepsis secondary to MRSA pneumonia, left pectoralis muscle as well as right shoulder effusion, skin infection of both lower extremities, possible septic emboli and acute metabolic encephalopathy. Patient's oral intake is extremely poor.  He is on thickened liquids. His sodium level has increased to 159 despite getting IV D5W   Review of Systems: ROS Patient is not able to provide any meaningful information.  He is moaning and answers yes to all questions.  Past Medical History:  Diagnosis Date   Alcoholic gastritis    Arthritis    CAD (coronary artery disease)    CHF (congestive heart failure) (HCC)    ischemic CM.  EF 25%   COPD (chronic obstructive pulmonary disease) (HCC)    COPD with acute exacerbation (HCC) 03/05/2013   CVA (cerebral infarction)    residual short term memory loss   Diabetes mellitus without complication (HCC)    diet controlled   DVT (deep venous thrombosis) (HCC)    Heart attack (HCC) 11/16/09   Hematospermia 10/23/2012   History of alcohol abuse 2005   now abstinent for years   Hyperlipidemia    Hypertension    On home oxygen therapy    2 liters continuously   OSA (obstructive sleep apnea)    not on CPAP    PAD (peripheral artery disease) (HCC)    Pneumonia    frequent in the past   Postoperative state 09/07/2014   Overview:  Last Assessment & Plan:  He has normal renal function.    Stroke (HCC) 2010   Stroke [facility])    Venous insufficiency of leg     Social History   Tobacco Use   Smoking status: Former    Packs/day: 2.00    Years: 25.00    Pack years: 50.00    Types: Cigarettes    Quit date: 09/26/2009    Years since quitting: 12.1   Smokeless tobacco: Never  Vaping Use   Vaping Use: Never used  Substance Use Topics   Alcohol use: Not Currently    Alcohol/week: 1.0 standard drink    Types: 1 Shots of liquor per week    Comment: Occ Beer    Drug use: No    Family History  Problem Relation Age of Onset   Heart disease Mother    Heart disease Father      OBJECTIVE: Blood pressure 137/62, pulse (!) 111, temperature 98 F (36.7 C), resp. rate 19, height 6' (1.829 m), weight 117.4 kg, SpO2 96 %.  Physical Exam General appearance: Chronically ill elderly gentleman HEENT: Dry oral mucous membranes Pulmonary: Bridger O2, normal breathing effort Cardiovascular: Irregular rhythm, no rub Abdomen: Soft.  External urine collection system in place Extremities: Bilateral legs are in  bandages Neuro: Alert, responds to voice, answers yes to all questions   Lab Results Lab Results  Component Value Date   WBC 20.3 (H) 11/12/2021   HGB 10.1 (L) 11/12/2021   HCT 32.6 (L) 11/12/2021   MCV 78.6 (L) 11/12/2021   PLT 268 11/12/2021    Lab Results  Component Value Date   CREATININE 1.80 (H) 11/13/2021   BUN 118 (H) 11/13/2021   NA 159 (H) 11/14/2021   K 3.5 11/13/2021   CL 107 11/13/2021   CO2 41 (H) 11/13/2021    Lab Results  Component Value Date   ALT 15 11/17/2021   AST 21 11/15/2021   ALKPHOS 164 (H) 10/30/2021   BILITOT 2.3 (H) 11/09/2021     Microbiology: Recent Results (from the past 240 hour(s))  Culture, blood (routine x 2)     Status: Abnormal   Collection  Time: 11/07/21  7:33 PM   Specimen: BLOOD  Result Value Ref Range Status   Specimen Description   Final    BLOOD LEFT ANTECUBITAL Performed at [facility], [address] [place], [place] [address]    Special Requests   Final    BOTTLES DRAWN AEROBIC AND ANAEROBIC Blood Culture adequate volume Performed at [facility], [address] [place], [place] [address]    Culture  Setup Time   Final    GRAM POSITIVE COCCI ANAEROBIC BOTTLE ONLY CRITICAL RESULT CALLED TO, READ BACK BY AND VERIFIED WITH: SHEEMA HALLAJI 11/17/2021 1210 SCS    Culture METHICILLIN RESISTANT STAPHYLOCOCCUS AUREUS (A)  Final   Report Status 11/11/2021 FINAL  Final   Organism ID, Bacteria METHICILLIN RESISTANT STAPHYLOCOCCUS AUREUS  Final      Susceptibility   Methicillin resistant staphylococcus aureus - MIC*    CIPROFLOXACIN <=0.5 SENSITIVE Sensitive     ERYTHROMYCIN >=8 RESISTANT Resistant     GENTAMICIN <=0.5 SENSITIVE Sensitive     OXACILLIN >=4 RESISTANT Resistant     TETRACYCLINE <=1 SENSITIVE Sensitive     VANCOMYCIN <=0.5 SENSITIVE Sensitive     TRIMETH/SULFA <=10 SENSITIVE Sensitive     CLINDAMYCIN <=0.25 SENSITIVE Sensitive     RIFAMPIN <=0.5 SENSITIVE Sensitive     Inducible Clindamycin NEGATIVE Sensitive     * METHICILLIN RESISTANT STAPHYLOCOCCUS AUREUS  Culture, blood (routine x 2)     Status: Abnormal   Collection Time: 11/07/21  7:33 PM   Specimen: BLOOD  Result Value Ref Range Status   Specimen Description   Final    BLOOD RIGHT ANTECUBITAL Performed at [facility], [address], [place], [place] [address]    Special Requests   Final    BOTTLES DRAWN AEROBIC AND ANAEROBIC Blood Culture adequate volume Performed at [facility], [address] [place], [place] [address]    Culture  Setup Time   Final    GRAM POSITIVE COCCI IN BOTH AEROBIC AND ANAEROBIC BOTTLES CRITICAL RESULT CALLED TO, READ BACK BY AND VERIFIED WITH: SHEEMA HALLAJI @1210   11/11/2021 SCS    Culture (A)  Final    STAPHYLOCOCCUS AUREUS SUSCEPTIBILITIES PERFORMED ON PREVIOUS CULTURE WITHIN THE LAST 5 DAYS. Performed at [facility], [address], [place], [place]    Report Status 11/11/2021 FINAL  Final  Blood Culture ID Panel (Reflexed)     Status: Abnormal   Collection Time: 11/07/21  7:33 PM  Result Value Ref Range Status   Enterococcus faecalis NOT DETECTED NOT DETECTED Final   Enterococcus Faecium NOT DETECTED  NOT DETECTED Final   Listeria monocytogenes NOT DETECTED NOT DETECTED Final   Staphylococcus species DETECTED (A) NOT DETECTED Final    Comment: CRITICAL RESULT CALLED TO, READ BACK BY AND VERIFIED WITH: SHEEMA HALLAJI @1210  11/23/2021 SCS    Staphylococcus aureus (BCID) DETECTED (A) NOT DETECTED Final    Comment: Methicillin (oxacillin)-resistant Staphylococcus aureus (MRSA). MRSA is predictably resistant to beta-lactam antibiotics (except ceftaroline). Preferred therapy is vancomycin unless clinically contraindicated. Patient requires contact precautions if  hospitalized. CRITICAL RESULT CALLED TO, READ BACK BY AND VERIFIED WITH: SHEEMA HALLAJI @1210  11/21/2021 SCS    Staphylococcus epidermidis NOT DETECTED NOT DETECTED Final   Staphylococcus lugdunensis NOT DETECTED NOT DETECTED Final   Streptococcus species NOT DETECTED NOT DETECTED Final   Streptococcus agalactiae NOT DETECTED NOT DETECTED Final   Streptococcus pneumoniae NOT DETECTED NOT DETECTED Final   Streptococcus pyogenes NOT DETECTED NOT DETECTED Final   A.calcoaceticus-baumannii NOT DETECTED NOT DETECTED Final   Bacteroides fragilis NOT DETECTED NOT DETECTED Final   Enterobacterales NOT DETECTED NOT DETECTED Final   Enterobacter cloacae complex NOT DETECTED NOT DETECTED Final   Escherichia coli NOT DETECTED NOT DETECTED Final   Klebsiella aerogenes NOT DETECTED NOT DETECTED Final   Klebsiella oxytoca NOT DETECTED NOT DETECTED Final   Klebsiella pneumoniae NOT  DETECTED NOT DETECTED Final   Proteus species NOT DETECTED NOT DETECTED Final   Salmonella species NOT DETECTED NOT DETECTED Final   Serratia marcescens NOT DETECTED NOT DETECTED Final   Haemophilus influenzae NOT DETECTED NOT DETECTED Final   Neisseria meningitidis NOT DETECTED NOT DETECTED Final   Pseudomonas aeruginosa NOT DETECTED NOT DETECTED Final   Stenotrophomonas maltophilia NOT DETECTED NOT DETECTED Final   Candida albicans NOT DETECTED NOT DETECTED Final   Candida auris NOT DETECTED NOT DETECTED Final   Candida glabrata NOT DETECTED NOT DETECTED Final   Candida krusei NOT DETECTED NOT DETECTED Final   Candida parapsilosis NOT DETECTED NOT DETECTED Final   Candida tropicalis NOT DETECTED NOT DETECTED Final   Cryptococcus neoformans/gattii NOT DETECTED NOT DETECTED Final   Meth resistant mecA/C and MREJ DETECTED (A) NOT DETECTED Final    Comment: CRITICAL RESULT CALLED TO, READ BACK BY AND VERIFIED WITH: SHEEMA HALLAJI @1210  11/14/2021 SCS Performed at [facility], [address], [place], [place] [address]   Urine Culture     Status: Abnormal   Collection Time: 11/07/21  8:45 PM   Specimen: Urine, Clean Catch  Result Value Ref Range Status   Specimen Description   Final    URINE, CLEAN CATCH Performed at [facility], [address] [place], [place] [address]    Special Requests   Final    NONE Performed at [facility], [address] [place], [place] [address]    Culture (A)  Final    80,000 COLONIES/mL METHICILLIN RESISTANT STAPHYLOCOCCUS AUREUS   Report Status 12/01/2021 FINAL  Final   Organism ID, Bacteria METHICILLIN RESISTANT STAPHYLOCOCCUS AUREUS (A)  Final      Susceptibility   Methicillin resistant staphylococcus aureus - MIC*    CIPROFLOXACIN <=0.5 SENSITIVE Sensitive     GENTAMICIN <=0.5 SENSITIVE Sensitive     NITROFURANTOIN <=16 SENSITIVE Sensitive     OXACILLIN >=4 RESISTANT Resistant     TETRACYCLINE <=1 SENSITIVE  Sensitive     VANCOMYCIN <=0.5 SENSITIVE Sensitive     TRIMETH/SULFA <=10 SENSITIVE Sensitive     CLINDAMYCIN <=0.25 SENSITIVE Sensitive     RIFAMPIN <=0.5 SENSITIVE Sensitive     Inducible  Clindamycin NEGATIVE Sensitive     * 80,000 COLONIES/mL METHICILLIN RESISTANT STAPHYLOCOCCUS AUREUS  Resp Panel by RT-PCR (Flu A&B, Covid) Nasopharyngeal Swab     Status: None   Collection Time: 11/05/2021  2:36 AM   Specimen: Nasopharyngeal Swab; Nasopharyngeal(NP) swabs in vial transport medium  Result Value Ref Range Status   SARS Coronavirus 2 by RT PCR NEGATIVE NEGATIVE Final    Comment: (NOTE) SARS-CoV-2 target nucleic acids are NOT DETECTED.  The SARS-CoV-2 RNA is generally detectable in upper respiratory specimens during the acute phase of infection. The lowest concentration of SARS-CoV-2 viral copies this assay can detect is 138 copies/mL. A negative result does not preclude SARS-Cov-2 infection and should not be used as the sole basis for treatment or other patient management decisions. A negative result may occur with  improper specimen collection/handling, submission of specimen other than nasopharyngeal swab, presence of viral mutation(s) within the areas targeted by this assay, and inadequate number of viral copies(<138 copies/mL). A negative result must be combined with clinical observations, patient history, and epidemiological information. The expected result is Negative.  Fact Sheet for Patients:  BloggerCourse.com  Fact Sheet for Healthcare Providers:  SeriousBroker.it  This test is no t yet approved or cleared by the [place] FDA and  has been authorized for detection and/or diagnosis of SARS-CoV-2 by FDA under an Emergency Use Authorization (EUA). This EUA will remain  in effect (meaning this test can be used) for the duration of the COVID-19 declaration under Section 564(b)(1) of the Act, 21 U.S.C.section  360bbb-3(b)(1), unless the authorization is terminated  or revoked sooner.       Influenza A by PCR NEGATIVE NEGATIVE Final   Influenza B by PCR NEGATIVE NEGATIVE Final    Comment: (NOTE) The Xpert Xpress SARS-CoV-2/FLU/RSV plus assay is intended as an aid in the diagnosis of influenza from Nasopharyngeal swab specimens and should not be used as a sole basis for treatment. Nasal washings and aspirates are unacceptable for Xpert Xpress SARS-CoV-2/FLU/RSV testing.  Fact Sheet for Patients: BloggerCourse.com  Fact Sheet for Healthcare Providers: SeriousBroker.it  This test is not yet approved or cleared by the [place] FDA and has been authorized for detection and/or diagnosis of SARS-CoV-2 by FDA under an Emergency Use Authorization (EUA). This EUA will remain in effect (meaning this test can be used) for the duration of the COVID-19 declaration under Section 564(b)(1) of the Act, 21 U.S.C. section 360bbb-3(b)(1), unless the authorization is terminated or revoked.  Performed at [facility], [address] [place], [place] [address]   CULTURE, BLOOD (ROUTINE X 2) w Reflex to ID Panel     Status: Abnormal   Collection Time: 11/09/21  7:22 AM   Specimen: BLOOD  Result Value Ref Range Status   Specimen Description   Final    BLOOD RIGHT WRIST Performed at [facility], [address], [place], [place] [address]    Special Requests   Final    BOTTLES DRAWN AEROBIC AND ANAEROBIC Blood Culture adequate volume Performed at [facility], [address], [place], [place] [address]    Culture  Setup Time   Final    GRAM POSITIVE COCCI ANAEROBIC BOTTLE ONLY CRITICAL VALUE NOTED.  VALUE IS CONSISTENT WITH PREVIOUSLY REPORTED AND CALLED VALUE.    Culture (A)  Final    STAPHYLOCOCCUS AUREUS SUSCEPTIBILITIES PERFORMED ON PREVIOUS CULTURE WITHIN THE LAST 5 DAYS. Performed at [facility],  [address] [place], [place] [address]  Report Status 11/12/2021 FINAL  Final  CULTURE, BLOOD (ROUTINE X 2) w Reflex to ID Panel     Status: None (Preliminary result)   Collection Time: 11/09/21  7:30 AM   Specimen: BLOOD RIGHT HAND  Result Value Ref Range Status   Specimen Description BLOOD RIGHT HAND  Final   Special Requests   Final    BOTTLES DRAWN AEROBIC AND ANAEROBIC Blood Culture adequate volume   Culture   Final    NO GROWTH 4 DAYS Performed at [facility], [address], [place], [place] [address]    Report Status PENDING  Incomplete  Aerobic/Anaerobic Culture w Gram Stain (surgical/deep wound)     Status: None (Preliminary result)   Collection Time: 11/18/2021 11:45 AM   Specimen: Wound  Result Value Ref Range Status   Specimen Description   Final    WOUND Performed at [facility], [address], [place], [place] [address]    Special Requests   Final    [facility] CHEST WALL Performed at [facility], [address] [place], [place] [address]    Gram Stain   Final    MODERATE WBC PRESENT,BOTH PMN AND MONONUCLEAR MODERATE GRAM POSITIVE COCCI Performed at [facility], [address] [place], [place] [address]    Culture   Final    MODERATE METHICILLIN RESISTANT STAPHYLOCOCCUS AUREUS NO ANAEROBES ISOLATED; CULTURE IN PROGRESS FOR 5 DAYS    Report Status PENDING  Incomplete   Organism ID, Bacteria METHICILLIN RESISTANT STAPHYLOCOCCUS AUREUS  Final      Susceptibility   Methicillin resistant staphylococcus aureus - MIC*    CIPROFLOXACIN <=0.5 SENSITIVE Sensitive     ERYTHROMYCIN >=8 RESISTANT Resistant     GENTAMICIN <=0.5 SENSITIVE Sensitive     OXACILLIN >=4 RESISTANT Resistant     TETRACYCLINE <=1 SENSITIVE Sensitive     VANCOMYCIN <=0.5 SENSITIVE Sensitive     TRIMETH/SULFA <=10 SENSITIVE Sensitive     CLINDAMYCIN <=0.25 SENSITIVE Sensitive     RIFAMPIN <=0.5 SENSITIVE Sensitive     Inducible Clindamycin NEGATIVE Sensitive      * MODERATE METHICILLIN RESISTANT STAPHYLOCOCCUS AUREUS  CULTURE, BLOOD (ROUTINE X 2) w Reflex to ID Panel     Status: None (Preliminary result)   Collection Time: 11/12/21  4:05 AM   Specimen: BLOOD  Result Value Ref Range Status   Specimen Description BLOOD LEFTH  Final   Special Requests   Final    BOTTLES DRAWN AEROBIC AND ANAEROBIC Blood Culture adequate volume   Culture   Final    NO GROWTH 1 DAY Performed at [facility], [address] [place], [place] [address]    Report Status PENDING  Incomplete  CULTURE, BLOOD (ROUTINE X 2) w Reflex to ID Panel     Status: None (Preliminary result)   Collection Time: 11/12/21  4:05 AM   Specimen: BLOOD  Result Value Ref Range Status   Specimen Description BLOOD RIGHTA  Final   Special Requests   Final    BOTTLES DRAWN AEROBIC ONLY Blood Culture adequate volume   Culture   Final    NO GROWTH 1 DAY Performed at [facility], [address], [place], [place] [address]    Report Status PENDING  Incomplete    Medications: Scheduled Meds:  chlorhexidine  15 mL Mouth Rinse BID   doxycycline  100 mg Oral Q12H   feeding supplement  237 mL Oral BID BM   fluticasone furoate-vilanterol  1  puff Inhalation Daily   And   umeclidinium bromide  1 puff Inhalation Daily   insulin aspart  0-15 Units Subcutaneous TID WC   mouth rinse  15 mL Mouth Rinse q12n4p   multivitamin with minerals  1 tablet Oral Daily   vitamin B-12  1,000 mcg Oral Daily   Continuous Infusions:  sodium chloride 10 mL/hr at 11/12/21 2140   DAPTOmycin (CUBICIN)  IV 750 mg (11/13/21 2135)   dextrose 100 mL/hr at 11/13/21 1232   PRN Meds:.sodium chloride, acetaminophen **OR** acetaminophen, ipratropium-albuterol, lip balm, nitroGLYCERIN, ondansetron **OR** ondansetron (ZOFRAN) IV, oxyCODONE  Allergies  Allergen Reactions   Clonidine Derivatives     Reaction unknown   Sulfa Antibiotics     Reaction unknown    Urinalysis: No results for  input(s): COLORURINE, LABSPEC, PHURINE, GLUCOSEU, HGBUR, BILIRUBINUR, KETONESUR, PROTEINUR, UROBILINOGEN, NITRITE, LEUKOCYTESUR in the last 72 hours.  Invalid input(s): APPERANCEUR    Imaging: No results found.    Assessment/Plan:  MAJOUR FREI is a 75 y.o. male with multiple medical problems was admitted on 11/17/2021 for :  Abscess of chest wall [L02.213] Abscess [L02.91] DVT (deep venous thrombosis) (HCC) [I82.409] Septic embolism (HCC) [I76] Bilateral pneumonia [J18.9] Cellulitis of left lower extremity [L03.116] AKI (acute kidney injury) (HCC) [N17.9] Septic arthritis of left sternoclavicular joint (HCC) [M00.9] Pneumonia of both lungs due to infectious organism, unspecified part of lung [J18.9]  #Hypernatremia #MRSA sepsis with multiple complications  Hypernatremia likely secondary to poor oral intake.  Patient's prognosis is very poor given underlying multiple medical conditions and also that he is not able to have adequate oral intake. At this time, continue supportive care with antibiotics Palliative care discussions are in progress to clarify goals of care Continue supportive care with IV dextrose   Marlaya Turck [name] 11/14/21

## 2021-11-14 NOTE — Consult Note (Addendum)
Consultation Note Date: 11/14/2021   Patient Name: Victor Daniels  DOB: 1946/11/09  MRN: [id number]  Age / Sex: 75 y.o., male  PCP: Crecencio Mc, MD Referring Physician: Fritzi Mandes, MD  Reason for Consultation: Establishing goals of care  HPI/Patient Profile: 75 y.o. male  with past medical history of diabetes type 2, chronic kidney disease stage III, COPD (home oxygen at baseline), chronic diastolic HF, history of CVA, and bilateral chronic venous stasis ulcers with dermatitis admitted on 10/26/2021 with complaints of pain all over and altered mental status.  CT revealed bilateral pneumonia with pulmonary nodules.  Palliative medicine was consulted to discuss goals of care.  Clinical Assessment and Goals of Care: I have reviewed medical records including EPIC notes, labs and imaging, assessed the patient and then met with patient, patient's friends and neighbors Vickii Chafe and Vicente Serene to discuss diagnosis prognosis, GOC, EOL wishes, disposition and options.   I introduced Palliative Medicine as specialized medical care for people living with serious illness. It focuses on providing relief from the symptoms and stress of a serious illness. The goal is to improve quality of life for both the patient and the family.  We discussed a brief life review of the patient.  Vickii Chafe shares that the patient held various jobs throughout his life.  His wife was a Pharmacist, hospital and principal who "was the breadwinner".  As far as functional and nutritional status prior to admission patient was living home independently but called Peggy several times a day to ask for assistance with ADLs, food, and housekeeping.  Peggy reports patient was independent with taking himself to doctors appointments and had a healthy appetite.  I attempted to elicit values and goals of care important to the patient.  Patient is unable to participate in  discussion.  Peggy and Vicente Serene conveyed they did not want to see him suffer but would rather want to keep him free of pain ensure his maximum comfort.  We discussed patient's current illness and what it means in the larger context of patient's on-going co-morbidities.  Natural disease trajectory and expectations at EOL were discussed.The difference between aggressive medical intervention and comfort care was considered in light of the patient's goals of care.  Peggy and Vicente Serene all agreed to move forward with a full comfort pathway.    After my discussion with patient and 2 friends, I spoke with patient's sister and made her aware of patient's current health status. She is in full agreement to move forward with comfort meaures, does not plan to visit, and would like to be notified when patient passes.   Comfort care described in detail and all - Deveron Furlong, and Remo Lipps - were in agreement.  I shared that we would be aggressive with the patient's symptoms management and that I likely anticipate hospital death.  However once his symptoms are better controlled if he is stable there is always the possibility that he could be transferred out of the hospital.  However, we will need more time to better manage his  symptoms and watch for these outcomes.  Questions and concerns were addressed. The family was encouraged to call with questions or concerns.   Primary Decision Maker NEXT OF KIN  Code Status/Advance Care Planning: DNR Full comfort measures - see MAR  Prognosis:   Hours - Days  Discharge Planning: Anticipated Hospital Death  Primary Diagnoses: Present on Admission:  Bilateral pneumonia  Atrial fibrillation with RVR ([place])  CAD (coronary artery disease)  Cellulitis of left lower extremity  Chronic diastolic CHF (congestive heart failure) (HCC)  COPD (chronic obstructive pulmonary disease) (HCC)  CKD stage 3 due to type 2 diabetes mellitus (HCC)  Acute worsening of stage 3 chronic kidney  disease (HCC)  Septic arthritis of left sternoclavicular joint ([place])   Physical Exam Vitals and nursing note reviewed.  Constitutional:      General: He is in acute distress.  HENT:     Head: Normocephalic and atraumatic.     Nose:     Comments: Band aid to left nare    Mouth/Throat:     Mouth: Mucous membranes are moist.  Cardiovascular:     Rate and Rhythm: Normal rate.     Pulses: Normal pulses.  Pulmonary:     Comments: Gosnell in place, shallow respirations Abdominal:     Palpations: Abdomen is soft.  Skin:    Comments: Bilateral ulcers with dressings in place  Neurological:     Mental Status: He is alert. Mental status is at baseline.  Psychiatric:     Comments: Anxious, yells out "help", "hurry"    Vital Signs: BP 126/69 (BP Location: Right Arm)   Pulse (!) 102   Temp 98 F (36.7 C)   Resp 18   Ht 6' (1.829 m)   Wt 117.4 kg   SpO2 93%   BMI 35.10 kg/m  Pain Scale: 0-10 POSS *See Group Information*: S-Acceptable,Sleep, easy to arouse Pain Score: 7  SpO2: SpO2: 93 % O2 Device:SpO2: 93 % O2 Flow Rate: .O2 Flow Rate (L/min): 3 L/min  Palliative Assessment/Data: 20%     I discussed this patient's plan of care with patient, patient's friends Vickii Chafe and [place], patient's siter Remo Lipps, Dr. Posey Pronto, RN Brittney.  Thank you for this consult. Palliative medicine will continue to follow and assist holistically.   Time Total: 70 minutes Greater than 50%  of this time was spent counseling and coordinating care related to the above assessment and plan.  Signed by: Jordan Hawks, DNP, FNP-BC Palliative Medicine    Please contact Palliative Medicine Team phone at [phone] for questions and concerns.  For individual provider: See Shea Evans

## 2021-11-14 NOTE — Progress Notes (Signed)
   Date of Admission:  20-Nov-2021   l  Assessment/Plan:  MRSA bacteremia with disseminated infection.  Pectoralis muscle abscess status post aspiration Bilateral leg wounds left more than right which would be the source TEE is negative for vegetation Right shoulder pain and restricted mobility with effusion- this could be infection as well Bilateral septic emboli with infiltrate Left sternoclavicular joint septic arthritis  Grim prognosis Encephalopathy  Patient has been made comfort care ID will sign off

## 2021-11-14 NOTE — Progress Notes (Signed)
[facility]  - [place] [facility]   PATIENT NAME: Victor Daniels    MR#:  [id number]  DATE OF BIRTH:  [birth year]  SUBJECTIVE:   patient overall has declined. Remains very lethargic. Barely able to keep his eyes open and answer any questions this morning. Very poor PO intake. REVIEW OF SYSTEMS:   Review of Systems  Unable to perform ROS: Medical condition   DRUG ALLERGIES:   Allergies  Allergen Reactions   Clonidine Derivatives     Reaction unknown   Sulfa Antibiotics     Reaction unknown    VITALS:  Blood pressure 126/69, pulse (!) 102, temperature 98 F (36.7 C), resp. rate 18, height 6' (1.829 m), weight 117.4 kg, SpO2 93 %.  PHYSICAL EXAMINATION:   Physical Exam  GENERAL:  75 y.o.-year-old patient lying in the bed with no acute distress. Chronically ill disheveled,obesity HEENT: Head atraumatic, normocephalic. Oropharynx and nasopharynx clear. Crusty discharge from both eyes LUNGS: decreased breath sounds bilaterally, no wheezing, rales, rhonchi. No use of accessory muscles of respiration.  CARDIOVASCULAR: S1, S2 normal. No murmurs, rubs, or gallops. tachycardia ABDOMEN: Soft, nontender, nondistended. Bowel sounds present. No organomegaly or mass.  EXTREMITIES: LE dressing+ NEUROLOGIC: nonfocal PSYCHIATRIC:  patient is lethargic SKIN:as above   LABORATORY PANEL:  CBC Recent Labs  Lab 11/14/21 0026  WBC 16.5*  HGB 10.0*  HCT 33.8*  PLT 229     Chemistries  Recent Labs  Lab 11/05/2021 0236 11/09/21 0721 11/14/21 0026  NA 133*   < > 159*  159*  K 3.5   < > 3.8  CL 89*   < > 109  CO2 34*   < > 40*  GLUCOSE 152*   < > 161*  BUN 103*   < > 111*  CREATININE 2.51*   < > 1.96*  CALCIUM 8.5*   < > 9.0  MG 2.0  --   --   AST 21  --   --   ALT 15  --   --   ALKPHOS 164*  --   --   BILITOT 2.3*  --   --    < > = values in this interval not displayed.    Cardiac Enzymes No results for input(s): TROPONINI in the last 168  hours. RADIOLOGY:  No results found. ASSESSMENT AND PLAN:    TANVIR HIPPLE is a 75 y.o. male with medical history significant for diabetes mellitus with complications of stage III chronic kidney disease, COPD with chronic respiratory failure on 2.5 L of oxygen continuous, chronic diastolic heart failure with last known LVEF of 55% from 2021, history of CVA, chronic venous stasis ulceration and dermatitis who presents to the ER for the second time in 2 days with complaints of pain all over but mostly in his lower extremities. Patient noted to have bilateral lower extremity wounds (Left > right) with drainage and foul odor.  Sepsis secondary to MRSA pneumonia,  Left Pectoralis muscles /Right shoulder effusion,skin infection both lower extremity Possible Septic emboli acute metabolic encephalopathy/uremia -- presented with altered mental status, acute renal failure, elevated white count, tachycardia -- appreciate ID consultation with Dr. Rivka Safer. Continue IV vancomycin -- monitor fever and white count -- patient will need TEE-- message sent to cardiology --11/17-- spoke with Doran Stabler who was a neighbor and POA regarding ultrasound-guided left sternal clavicular/pectoral muscle abscess drained by IR and TEE-- she understands risk and complications and wants the procedure done.  --UC -MRSA --BC-- MR  Staph aureus --11/18--WC from left pectoral abscess --Staph aureus --now on IV daptomycin and Doxycycline --11/19 CT right shoulder severe DJD with some effusion Speech downgraded diet to pureed with nectar thick --11/20-- sodium 158--seems pt is on nectar thick and poor po intake. Start d5W --11/21-- patient remains clinically really very dehydrated and encephalopathic elevated BUN  Hypernatremia --pt on nectar thick, poor po intake --Na 158--160--159 --cont IV D5W  Leucocytosis --came in with 20.5K--18.4--18.6--21--20.3  Bilateral lower extremity chronic venous stasis and  edema with chronic wounds cellulitis Scrotal edema -- wound consult for dressing changes -- on abxs -- ultrasound Doppler negative for DVT  Diabetes with stage III chronic kidney disease -- sliding scale insulin -- carb control diet  acute on chronic kidney disease stage III Left Nephrectomy Uremic encephalopathy -- patient came in with creatinine of 4.28 in the setting of sepsis -- IV fluids--- creatinine down to 1.6 (4.28)--1.4--1.80--1.90  -- baseline creatinine 1.6 --US renal --no acute abnormality -- nephrology consultation with Dr. Candiss Norse  atrial fibrillation chronic -- continue Xarelto (holding for procedure)  COPD with chronic respiratory failure -- continue nebulizer and bronchodilator therapy  chronic diastolic dysfunction --Stable --Not acutely exacerbated --Hold torsemide, spironolactone, metolazone and Cozaar due to worsening renal function  Failure to thrive/unable to care for self/weakness ?cognitive decline --TOC for d/c planning  Palliative care to see pt--overall poor prognosis long term called and spoke with neighbor Conley Rolls. Peggy understands patient is very poor prognosis with high mortality rate and will likely not make through this hospitalization due to severe sepsis with renal failure and acute metabolic encephalopathy. Patient is not have any other family contacts other than Peggy and Vicente Serene were neighbor friends patient is DNR DNI     Procedures:TEE, CT guided drainage of left pectoralis muscle abscess 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  >50% time spent on counselling and coordination of care  Note: This dictation was prepared with Dragon dictation along with smaller phrase technology. Any  transcriptional errors that result from this process are unintentional.  Fritzi Mandes M.D    [facility]   CC: Primary care physician; Crecencio Mc, MD Patient ID: Edson Snowball, male   DOB: 21-May-1946, 75 y.o.   MRN: [id number]

## 2021-11-14 NOTE — Progress Notes (Signed)
PT Cancellation Note  Patient Details Name: Victor Daniels MRN: [id number] DOB: Jul 19, 1946   Cancelled Treatment:    Reason Eval/Treat Not Completed: Other (comment);Medical issues which prohibited therapy (PT orders have been cancelled by the provider).    Donna Bernard, PT, MPT  Ina Homes 11/14/2021, 2:09 PM

## 2021-11-15 DIAGNOSIS — J189 Pneumonia, unspecified organism: Secondary | ICD-10-CM | POA: Diagnosis not present

## 2021-11-15 DIAGNOSIS — R52 Pain, unspecified: Secondary | ICD-10-CM

## 2021-11-15 DIAGNOSIS — R6520 Severe sepsis without septic shock: Secondary | ICD-10-CM | POA: Diagnosis not present

## 2021-11-15 DIAGNOSIS — N179 Acute kidney failure, unspecified: Secondary | ICD-10-CM

## 2021-11-15 DIAGNOSIS — J15212 Pneumonia due to Methicillin resistant Staphylococcus aureus: Secondary | ICD-10-CM | POA: Diagnosis not present

## 2021-11-15 DIAGNOSIS — A4102 Sepsis due to Methicillin resistant Staphylococcus aureus: Secondary | ICD-10-CM | POA: Diagnosis not present

## 2021-11-15 DIAGNOSIS — E1122 Type 2 diabetes mellitus with diabetic chronic kidney disease: Secondary | ICD-10-CM

## 2021-11-15 DIAGNOSIS — I5032 Chronic diastolic (congestive) heart failure: Secondary | ICD-10-CM | POA: Diagnosis not present

## 2021-11-15 DIAGNOSIS — L03116 Cellulitis of left lower limb: Secondary | ICD-10-CM | POA: Diagnosis not present

## 2021-11-15 LAB — AEROBIC/ANAEROBIC CULTURE W GRAM STAIN (SURGICAL/DEEP WOUND)

## 2021-11-15 MED ORDER — MORPHINE 100MG IN NS 100ML (1MG/ML) PREMIX INFUSION
1.0000 mg/h | INTRAVENOUS | Status: DC
  Administered 2021-11-15: 6 mg/h via INTRAVENOUS
  Administered 2021-11-15: 4 mg/h via INTRAVENOUS
  Administered 2021-11-15: 2 mg/h via INTRAVENOUS
  Administered 2021-11-16: 7 mg/h via INTRAVENOUS
  Filled 2021-11-15 (×2): qty 100

## 2021-11-15 MED ORDER — MORPHINE SULFATE (PF) 2 MG/ML IV SOLN
2.0000 mg | INTRAVENOUS | Status: DC | PRN
  Administered 2021-11-15: 2 mg via INTRAVENOUS
  Filled 2021-11-15: qty 1

## 2021-11-15 MED ORDER — MORPHINE BOLUS VIA INFUSION
2.0000 mg | INTRAVENOUS | Status: DC | PRN
  Administered 2021-11-15 (×3): 2 mg via INTRAVENOUS
  Filled 2021-11-15: qty 2

## 2021-11-15 MED ORDER — MORPHINE BOLUS VIA INFUSION
1.0000 mg | INTRAVENOUS | Status: DC | PRN
  Filled 2021-11-15: qty 1

## 2021-11-15 NOTE — TOC Progression Note (Addendum)
Transition of Care [facility]) - Progression Note    Patient Details  Name: Victor Daniels MRN: [id number] Date of Birth: 08/23/1946  [facility] CM/SW Contact  Gildardo Griffes, [place] Phone Number: 11/15/2021, 8:38 AM  Clinical Narrative:     Update: per MD and Palliative NP, patient meets criteria for hospice home referral and family in agreement. CSW has sent referral to Gwynneth Aliment with [facility].   CSW notes patient/family have consulted with palliative and have decided on full comfort measures at this time.   TOC will continue to follow for any needs at this time.    Expected Discharge Plan: Skilled Nursing Facility Barriers to Discharge: Continued Medical Work up  Expected Discharge Plan and Services Expected Discharge Plan: Skilled Nursing Facility In-house Referral: Clinical Social Work   Post Acute Care Choice: Skilled Nursing Facility Living arrangements for the past 2 months: Single Family Home                                       Social Determinants of Health (SDOH) Interventions    Readmission Risk Interventions No flowsheet data found.

## 2021-11-15 NOTE — Care Management Important Message (Signed)
Important Message  Patient Details  Name: Victor Daniels MRN: [id number] Date of Birth: 22-Feb-1946   Medicare Important Message Given:  Other (see comment)  On comfort care measures.  Medicare IM withheld at this time.   Johnell Comings 11/15/2021, 3:27 PM

## 2021-11-15 NOTE — Progress Notes (Signed)
Nutrition Brief Note  Chart reviewed. Pt now transitioning to comfort care.  No further nutrition interventions planned at this time.  Please re-consult as needed.   Raihana Balderrama W, RD, LDN, CDCES Registered Dietitian II Certified Diabetes Care and Education Specialist Please refer to AMION for RD and/or RD on-call/weekend/after hours pager   

## 2021-11-15 NOTE — Progress Notes (Signed)
[facility]) Hospital Liaison Note   Patient chart and information reviewed by [facility] physician. Hospice Home eligibility confirmed.    Unfortunately, Hospice Home is not able to offer a room today. Family and [facility] Manager aware hospital liaison will follow up tomorrow or sooner if a room becomes available.    Please do not hesitate to call with any hospice related questions.    Thank you for the opportunity to participate in this patient's care.   Bobbie "Einar Gip, RN, BSN [facility] Liaison [phone]

## 2021-11-15 NOTE — Progress Notes (Signed)
Pt observed resting comfortably in bed with Morphine gtt in place at 38ml/hr. No acute distress noted, no grimacing or moaning noted. Pt still slightly arousable with verbal and tactile stimuli. Read notes today pt pain was not controlled. Primary RN to ask pt more frequently how is his pain instead of observing nonverbal signs of pain. Call light within reach. Safety measures intact.

## 2021-11-15 NOTE — Progress Notes (Signed)
Palliative Care Progress Note, Assessment & Plan   Patient Name: Victor Daniels       Date: 11/15/2021 DOB: 1946/12/05  Age: 75 y.o. MRN#: [id number] Attending Physician: Fritzi Mandes, MD Primary Care Physician: Crecencio Mc, MD Admit Date: 11/15/2021  Reason for Consultation/Follow-up: Establishing goals of care  Subjective: Patient is lying in bed and what appears to be mild distress.  He is moaning and his brow is furrowed.  When asked if he is in pain he says yes.  He states the pain is in his neck and back.  He says he is hungry.  But when I attempt to bring head of bed up he endorses the pain is too bad and wishes to be laid back down again.  HPI: 75 y.o. male  with past medical history of diabetes type 2, chronic kidney disease stage III, COPD (home oxygen at baseline), chronic diastolic HF, history of CVA, and bilateral chronic venous stasis ulcers with dermatitis admitted on 11/14/2021 with complaints of pain all over and altered mental status.  CT revealed bilateral pneumonia with pulmonary nodules.   Palliative medicine was consulted to discuss goals of care.  Summary of counseling/coordination of care: After reviewing the patient's progress overnight, I assessed the patient at bedside.  Full comfort care is still in effect.  He endorses pain that is not being well controlled.  Prior to me entering the room I observed the patient and he was fidgeting and moving around in the bed, appearing agitated.  Patient's pain seems to be poorly controlled.  He also has agitation that is not being well managed.  I notified nursing to please be more aggressive with his pain management and agitation medication.  She shared she would be.  I will round on the patient this afternoon to ensure all  symptoms are better controlled.  I am concerned that we continue to give him oxygen through his nasal cannula.  My recommendation would be to leave this in place until his pain is better controlled.  Then, we can titrate him off of his oxygen since it is a prolonging medication that does not focus fully on comfort.  I discussed the titration and removal of supplemental oxygen with Vickii Chafe and Curtis at bedside yesterday.  However, patient's pain is priority at this time.  I spoke with patient's friend Vickii Chafe over the phone.  She was grateful for the update.  She asked me if she thought Ed was going to "pull through".  I shared that we are ultimately not in charge of his transition but that given his nutritional and functional status his prognosis is poor.  We will continue to monitor him and continue comfort pathway.  She again reiterated she did not want him to suffer.  She also recalled having conversations with Ed he said that laying in a hospital bed was no way to live.  I will continue to round on the patient and monitor him for symptom management.  Code Status: DNR  Prognosis: Hours - Days  Discharge Planning: To Be Determined  Recommendations/Plan: Continue full comfort measures More aggressive pain management -nursing aware  Care plan was discussed with patient, patient's friend Vickii Chafe, RN Janett Billow  Physical Exam Vitals and nursing note reviewed.  Constitutional:      Appearance: He is ill-appearing.     Comments: Moaning, furrowing of brow  HENT:     Head: Normocephalic and atraumatic.  Cardiovascular:     Rate and Rhythm: Normal rate.     Pulses: Normal pulses.  Pulmonary:     Effort: Pulmonary effort is normal.  Abdominal:     Palpations: Abdomen is soft.  Musculoskeletal:     Comments: Generalized weakness  Skin:    General: Skin is warm and dry.     Comments: Bilateral LE ulcers  Neurological:     Comments: Able to verbalize he is in pain, pain has not been  controlled, and pain is mostly in his neck and back  Psychiatric:        Thought Content: Thought content normal.               Total Time 25 minutes  Greater than 50%  of this time was spent counseling and coordinating care related to the above assessment and plan.  Thank you for allowing the Palliative Medicine Team to assist in the care of this patient.  Samara Deist L. Manon Hilding, FNP-BC Palliative Medicine Team Team Phone # [phone]

## 2021-11-15 NOTE — Progress Notes (Signed)
[facility] [employer] [facility]) Hospital Liaison Note  Received request from Transitions of Care Manager Angeline Slim, LCSW for patient/caregiver interest in Hospice Home. Visited patient at bedside and spoke with friend Gigi Gin to confirm interest and explain services.  Approval for Hospice Home is determined by [facility] MD. Once [facility] MD has determined Hospice Home eligibility, [facility] will update hospital staff and family.  Please do not hesitate to call with any hospice related questions.    Thank you for the opportunity to participate in this patient's care.   Bobbie "Einar Gip, RN, BSN [facility] Liaison [phone]

## 2021-11-15 NOTE — Progress Notes (Signed)
[facility] [place] NAME: Victor Daniels    MR#:  [id number]  DATE OF BIRTH:  July 05, 1946  SUBJECTIVE:    Remains very lethargic. Moaning  REVIEW OF SYSTEMS:   Review of Systems  Unable to perform ROS: Medical condition   DRUG ALLERGIES:   Allergies  Allergen Reactions   Clonidine Derivatives     Reaction unknown   Sulfa Antibiotics     Reaction unknown    VITALS:  Blood pressure 126/63, pulse 100, temperature 99.1 F (37.3 C), temperature source Axillary, resp. rate (!) 22, height 6' (1.829 m), weight 117.4 kg, SpO2 98 %.  PHYSICAL EXAMINATION:   Physical Exam  GENERAL:  75 y.o.-year-old patient lying in the bed with no acute distress. Chronically ill disheveled,obesity Moaning, very ill appearing LUNGS: decreased breath sounds bilaterally, no wheezing, rales, rhonchi. No use of accessory muscles of respiration.  CARDIOVASCULAR: S1, S2 normal. No murmurs, rubs, or gallops. tachycardia EXTREMITIES: LE dressing+ NEUROLOGIC: nonfocal PSYCHIATRIC:  patient is lethargic SKIN:as above   LABORATORY PANEL:  CBC Recent Labs  Lab 11/14/21 0026  WBC 16.5*  HGB 10.0*  HCT 33.8*  PLT 229     Chemistries  Recent Labs  Lab 11/14/21 0026  NA 159*  159*  K 3.8  CL 109  CO2 40*  GLUCOSE 161*  BUN 111*  CREATININE 1.96*  CALCIUM 9.0    Cardiac Enzymes No results for input(s): TROPONINI in the last 168 hours. RADIOLOGY:  No results found. ASSESSMENT AND PLAN:    Victor Daniels is a 75 y.o. male with medical history significant for diabetes mellitus with complications of stage III chronic kidney disease, COPD with chronic respiratory failure on 2.5 L of oxygen continuous, chronic diastolic heart failure with last known LVEF of 55% from 2021, history of CVA, chronic venous stasis ulceration and dermatitis who presents to the ER for the second time in 2 days with complaints of pain all over but mostly in his  lower extremities. Patient noted to have bilateral lower extremity wounds (Left > right) with drainage and foul odor.  Sepsis secondary to MRSA pneumonia,  Left Pectoralis muscles /Right shoulder effusion,skin infection both lower extremity Possible Septic emboli acute metabolic encephalopathy/uremia -- presented with altered mental status, acute renal failure, elevated white count, tachycardia -- appreciate ID consultation with Dr. Delaine Lame. Continue IV vancomycin -- monitor fever and white count -- patient will need TEE-- message sent to cardiology --11/17-- spoke with Conley Rolls who was a neighbor and POA regarding ultrasound-guided left sternal clavicular/pectoral muscle abscess drained by IR and TEE-- she understands risk and complications and wants the procedure done.  --UC -MRSA --BC-- MR Staph aureus --11/18--WC from left pectoral abscess --Staph aureus --now on IV daptomycin and Doxycycline --11/19 CT right shoulder severe DJD with some effusion Speech downgraded diet to pureed with nectar thick --11/20-- sodium 158--seems pt is on nectar thick and poor po intake. Start d5W --11/21-- patient remains clinically really very dehydrated and encephalopathic elevated BUN --11/22-- pt is now on comfort care measures. Appropriate for hospice facility  Hypernatremia --pt on nectar thick, poor po intake --Na 158--160--159  Leucocytosis --came in with 20.5K--18.4--18.6--21--20.3  Bilateral lower extremity chronic venous stasis and edema with chronic wounds cellulitis Scrotal edema -- wound consult for dressing changes --- ultrasound Doppler negative for DVT  Diabetes with stage III chronic kidney disease  acute on chronic kidney disease stage III Left Nephrectomy Uremic encephalopathy -- patient came  in with creatinine of 4.28 in the setting of sepsis --  creatinine down to 1.6 (4.28)--1.4--1.80--1.90  -- baseline creatinine 1.6 --US renal --no acute abnormality --  nephrology consultation with Dr. [name]  atrial fibrillation chronic  COPD with chronic respiratory failure -- continue nebulizer and bronchodilator therapy  chronic diastolic dysfunction --Stable --Not acutely exacerbated  Failure to thrive/unable to care for self/weakness ?cognitive decline --TOC for d/c planning  Palliative care to see pt--overall poor prognosis long term called and spoke with neighbor Doran Stabler. Peggy understands patient is very poor prognosis with high mortality rate and will likely not make through this hospitalization due to severe sepsis with renal failure and acute metabolic encephalopathy. Patient is not have any other family contacts other than Peggy and Lyda Jester were neighbor friends patient is DNR DNI     Procedures:TEE, CT guided drainage of left pectoralis muscle abscess Family communication :Lobbyist (POA) Consults :ID, [facility] cards CODE STATUS:DNR/DNI DVT Prophylaxis : Level of care: Med-Surg Status is: Inpatient  Remains inpatient appropriate because: comfort care        TOTAL TIME TAKING CARE OF THIS PATIENT: 20 minutes.  >50% time spent on counselling and coordination of care  Note: This dictation was prepared with Dragon dictation along with smaller phrase technology. Any transcriptional errors that result from this process are unintentional.  Enedina Finner M.D    [facility]   CC: Primary care physician; [name], MD Patient ID: Victor Daniels, male   DOB: 22-Jun-1946, 75 y.o.   MRN: [id number]

## 2021-11-15 NOTE — Progress Notes (Signed)
Pt continues on comfort with no acute distress noted. No PRN given at this point in the shift. Pt has been resting comfortably all shift with no signs of pain or discomfort; no grimacing or moaning noted during RN rounds. Pt requested po fluids x1 this shift , request honored. Supplemental O2 remains in place for comfort. Pt continues to have urinary output via primofit. Call light within reach, frequent monitoring in place, and safety measures intact.

## 2021-11-16 DIAGNOSIS — N183 Chronic kidney disease, stage 3 unspecified: Secondary | ICD-10-CM | POA: Diagnosis not present

## 2021-11-16 DIAGNOSIS — L0291 Cutaneous abscess, unspecified: Secondary | ICD-10-CM

## 2021-11-16 DIAGNOSIS — L02213 Cutaneous abscess of chest wall: Secondary | ICD-10-CM | POA: Diagnosis not present

## 2021-11-16 DIAGNOSIS — J15212 Pneumonia due to Methicillin resistant Staphylococcus aureus: Secondary | ICD-10-CM | POA: Diagnosis not present

## 2021-11-16 DIAGNOSIS — I76 Septic arterial embolism: Secondary | ICD-10-CM

## 2021-11-17 LAB — CULTURE, BLOOD (ROUTINE X 2)
Culture: NO GROWTH
Culture: NO GROWTH
Special Requests: ADEQUATE
Special Requests: ADEQUATE

## 2021-11-24 NOTE — Progress Notes (Signed)
Daily Progress Note   Patient Name: Victor Daniels       Date: December 08, 2021 DOB: 16-Nov-1946  Age: 75 y.o. MRN#: [id number] Attending Physician: Richarda Osmond, MD Primary Care Physician: Crecencio Mc, MD Admit Date: 11/15/2021 Length of Stay: 8 days  Reason for Consultation/Follow-up: Terminal Care  HPI/Patient Profile:  75 y.o. male  with past medical history of diabetes type 2, chronic kidney disease stage III, COPD (home oxygen at baseline), chronic diastolic HF, history of CVA, and bilateral chronic venous stasis ulcers with dermatitis admitted on 11/18/2021 with complaints of pain all over and altered mental status.  CT revealed bilateral pneumonia with pulmonary nodules.   Palliative medicine was consulted to discuss goals of care.  Subjective:   Subjective: Chart Reviewed. Updates received. Patient Assessed. Created space and opportunity for patient  and family to explore thoughts and feelings regarding current medical situation.  Today's Discussion: Prior to entering the room I spoke with the nurse who informed me that the patient has had a rapid decline since initiated comfort care.  Decubitus it is anticipated he will suffer a hospital death and the hospitalist and hospice personnel have elected to keep the patient in place at this time.  I met with the patient's neighbor and another good friend at the bedside.  Discussed that the patient appears comfortable and our goal is to keep in place due to his rapid decline, which they are in agreement with.  We spent time allowing him to reminisce about caring for the patient's wife in her life and caring for the patient past several months due to his loneliness and significant physical needs.  We discussed the concept of family being more than blood.  They are sad that he is nearing the end of his life but they are happy that he is comfortable, which is their primary goal.  I provided emotional general support through  therapeutic listening, sharing of stories, reminiscing, other techniques.  I answered all questions and addressed all concerns at that time.  At the end of the visit it became evident that the patient had just passed away. I spend time with loved ones to provide emotional support.  Review of Systems  Unable to perform ROS: Patient unresponsive   Objective:   Vital Signs:  BP (!) 67/40 (BP Location: Left Arm)   Pulse (!) 46   Temp 99.1 F (37.3 C) (Oral)   Resp 19   Ht 6' (1.829 m)   Wt 117.4 kg   SpO2 93%   BMI 35.10 kg/m   Physical Exam: Physical Exam Vitals and nursing note reviewed.  Constitutional:      Comments: When completing my exam near the end of the visit, it was noted the patient had just passed away. Confirmed no heartbeat with the nurse. Notified bedside neighbor and friend.    Palliative Assessment/Data: 0%   Assessment & Plan:   Impression: Present on Admission:  Bilateral pneumonia  Atrial fibrillation with RVR (HCC)  CAD (coronary artery disease)  Cellulitis of left lower extremity  Chronic diastolic CHF (congestive heart failure) (HCC)  COPD (chronic obstructive pulmonary disease) (HCC)  CKD stage 3 due to type 2 diabetes mellitus (HCC)  Acute worsening of stage 3 chronic kidney disease (HCC)  Septic arthritis of left sternoclavicular joint ([place])  75 year old male comfort care with plans for transition to hospice due to declining on comfort measures.  I was able to spend time with the patient's neighbor (caregiver for the last  several months, and his good friend (also helping to provide care for several months.)  At the end of the visit when I attempted to examine the patient's it appears that he would just ask.  I notified bedside neighbor and friend after confirming as needed.  Care team has been updated.  When realizing he had passed, I spent time with loved ones to offer support.  SUMMARY OF RECOMMENDATIONS   Continue family/friends  support Post-mortem care per nursing staff Neighbor requests patient sister to be notified who will make funeral arrangements  Code Status: DNR  Prognosis: Hours - Days  Discharge Planning: Anticipated Hospital Death  Discussed with: Nursing staff, medical staff, patient's neighbor/POA, patient's friend  Thank you for allowing [place] to participate in the care of LEONA PRESSLY PMT will continue to support holistically.  Time Total: 65 min  Visit consisted of counseling and education dealing with the complex and emotionally intense issues of symptom management and palliative care in the setting of serious and potentially life-threatening illness. Greater than 50%  of this time was spent counseling and coordinating care related to the above assessment and plan.  Walden Field, NP Palliative Medicine Team  Team Phone # [phone] (Nights/Weekends)  08/23/2021, 8:17 AM

## 2021-11-24 NOTE — Progress Notes (Signed)
[facility] [address] [facility]) Hospital Liaison Note  Patient has declined and now expected a hospital death. Hospice Home referral closed.   Support provided to caregiver Peggy.  Please do not hesitate to call with any hospice related questions.  Thank you,  Bobbie "Einar Gip, RN, BSN [facility] Liaison [phone]

## 2021-11-24 NOTE — Progress Notes (Addendum)
Time of death 10:15am / pronounced by myself and Wynne Dust, NP/ pts support, Peggy at bedside/ MD made aware/ pts sister, Rojelio Brenner called to  make aware/ states she dose not live in area and needs to get some information/ instructed her to call Nursing Sup when she has chosen a funeral home/ post mortem care provided/ transported to morgue

## 2021-11-24 NOTE — Death Summary Note (Signed)
DEATH SUMMARY   Patient Details  Name: Victor Daniels MRN: [id number] DOB: Dec 11, 1946  Admission/Discharge Information   Admit Date:  Nov 23, 2021  Date of Death: Date of Death: 2021-12-01  Time of Death: Time of Death: 04-17-14  Length of Stay: 8  Referring Physician: Crecencio Mc, MD   Reason(s) for Hospitalization  MRSA bacteremia, Bilateral pneumonia, acute encephalopathy Diagnoses  Preliminary cause of death:  Secondary Diagnoses (including complications and co-morbidities):  Principal Problem:   Bilateral pneumonia Active Problems:   COPD (chronic obstructive pulmonary disease) ([place])   CAD (coronary artery disease)   CKD stage 3 due to type 2 diabetes mellitus ([place])   Atrial fibrillation with RVR ([place])   Cellulitis of left lower extremity   Chronic diastolic CHF (congestive heart failure) (H[facility])   Acute worsening of stage 3 chronic kidney disease ([facility])   Septic arthritis of left sternoclavicular joint ([facility])   Sepsis due to methicillin resistant Staphylococcus aureus (MRSA) with acute renal failure [facility])  Brief Hospital Course (including significant findings, care, treatment, and services provided and events leading to death)  Victor Daniels is a 75 y.o. year old male who presented to the ED from home with "pain all over". He was found to have possible septic left sternoclavicular joint with abscess in chest wall, AKI, and bilateral pneumonia. He was started on IV antibiotics as well as supportive care. Blood cultures grew MRSA and ID was consulted to further guide Abx therapy. Patient remained encephalopathic with poor overall prognosis so was transitioned to comfort care on 11/21 after discussions with palliative care and his [place] (neighbor).  On the morning of December 02, 2023. I examined patient who appeared comfortable with disregulated breathing pattern and unresponsive.  Later, patient's good friend and neighbor were at his bedside speaking with palliative care at  the time of 1014/04/17 when patient passed away of natural causes.   Pertinent Labs and Studies  Significant Diagnostic Studies DG Chest 2 View  Result Date: 11/07/2021 CLINICAL DATA:  Weakness and confusion. EXAM: CHEST - 2 VIEW COMPARISON:  Chest radiograph dated 05/29/2021. FINDINGS: Bibasilar linear and streaky atelectasis/scarring. An area of increased density at the left lung base noted. Pneumonia is not excluded. Clinical correlation is recommended. There is blunting of the right costophrenic angle which may represent scarring or trace pleural effusion. No pneumothorax. Mild cardiomegaly. No acute osseous pathology. Degenerative changes of the spine. IMPRESSION: 1. Left lung base atelectasis versus infiltrate. 2. Mild cardiomegaly. Electronically Signed   By: Anner Crete M.D.   On: 11/07/2021 21:25   DG Thoracic Spine 2 View  Result Date: 11/07/2021 CLINICAL DATA:  Fall, back pain EXAM: THORACIC SPINE 2 VIEWS COMPARISON:  None. FINDINGS: Normal alignment. No fracture. Flowing anterior osteophytes. No focal bone lesion. IMPRESSION: No acute bony abnormality. Electronically Signed   By: Rolm Baptise M.D.   On: 11/07/2021 21:26   DG Lumbar Spine 2-3 Views  Result Date: 11/07/2021 CLINICAL DATA:  Fall, back pain EXAM: LUMBAR SPINE - 2-3 VIEW COMPARISON:  None. FINDINGS: Normal alignment. No fracture. Diffuse degenerative disc and facet disease. SI joints symmetric. IMPRESSION: No acute bony abnormality. Electronically Signed   By: Rolm Baptise M.D.   On: 11/07/2021 21:25   CT HEAD WO CONTRAST (5MM)  Result Date: 11/07/2021 CLINICAL DATA:  75 year old male with altered mental status, confusion and head and neck pain. EXAM: CT HEAD WITHOUT CONTRAST CT CERVICAL SPINE WITHOUT CONTRAST TECHNIQUE: Multidetector CT imaging of the head and cervical spine was performed  following the standard protocol without intravenous contrast. Multiplanar CT image reconstructions of the cervical spine were also  generated. COMPARISON:  05/25/2005 brain MRI and 10/25/2004 CT cervical spine FINDINGS: CT HEAD FINDINGS Brain: No evidence of acute infarction, hemorrhage, hydrocephalus, extra-axial collection or mass lesion/mass effect. Remote bilateral occipital and RIGHT posterior parietal infarcts are noted. Mild chronic small vessel white matter ischemic changes are present. Vascular: Carotid and vertebral atherosclerotic calcifications are noted. Skull: Normal. Negative for fracture or focal lesion. Sinuses/Orbits: No acute finding. Other: None CT CERVICAL SPINE FINDINGS Alignment: Normal. Skull base and vertebrae: No acute fracture. No primary bone lesion or focal pathologic process. Soft tissues and spinal canal: No prevertebral fluid or swelling. No visible canal hematoma. Disc levels: Anterior fusion changes from C3-C5 noted. Moderate degenerative disc disease/spondylosis from C5-C7 noted. Mild-to-moderate multilevel facet arthropathy is identified. These changes contribute to mild central spinal and mild to moderate biforaminal narrowing. Upper chest: Several LEFT UPPER lobe nodular opacities are noted, not completely imaged or characterized. Other: None IMPRESSION: 1. No evidence of acute intracranial abnormality. Remote bilateral occipital and RIGHT posterior parietal infarcts. Mild chronic small vessel white matter ischemic changes. 2. No static evidence of acute injury to the cervical spine. Degenerative and postsurgical changes as described. 3. LEFT UPPER lobe pulmonary nodular opacities, not completely imaged or characterized. Chest CT with contrast is recommended for further evaluation. Electronically Signed   By: Margarette Canada M.D.   On: 11/07/2021 21:12   CT CHEST WO CONTRAST  Result Date: 10/29/2021 CLINICAL DATA:  75 year old male with generalized pain. Abnormal left lung base on chest radiographs yesterday. UTI. EXAM: CT CHEST WITHOUT CONTRAST TECHNIQUE: Multidetector CT imaging of the chest was performed  following the standard protocol without IV contrast. COMPARISON:  Chest radiographs 11/07/2021 and earlier. Chest CT 06/04/2018. FINDINGS: Cardiovascular: Cardiac size is increased since 2019 but there is no pericardial effusion. Calcified coronary artery and Calcified aortic atherosclerosis. Vascular patency is not evaluated in the absence of IV contrast. Mediastinum/Nodes: Small right paratracheal venous collaterals appear increased since 2019. Mediastinal lymph nodes remain within normal limits. Lungs/Pleura: Right lower lung chronic pleural thickening and calcification. Lower lung volumes compared to 2019. Major airways remain patent. There is extensive indistinct peribronchial nodularity throughout the superior segment of the left lower lobe, with consolidation elsewhere in that lower lobe. Similar new consolidation in the right lower lobe, and mild right middle lobe nodularity superimposed on chronic scarring and architectural distortion there. There are also multiple solid and sub solid indistinct bilateral new pulmonary nodules (series 3, image 39 in the left upper lobe). And 1 of the nodules in the right middle lobe shows evidence of early cavitation on series 3, image 106. In the right upper lobe some of the nodules are peripheral and wedge-shaped. No pleural effusion. Upper Abdomen: Negative visible noncontrast liver, spleen, pancreas, adrenal glands, and bowel in the upper abdomen. Musculoskeletal: Chronic severe degeneration at both shoulders and in the spine. Abnormal left chest wall with expanded left pectoralis muscle containing both numerous gas foci (series 2, image 56) and also possible intramuscular fluid. Reactive appearing left axillary lymph nodes. Conspicuous soft tissue gas also about the left sternoclavicular joint with asymmetric soft tissue swelling at the medial joint there also (coronal image 38). But no definite osteolysis. Superimposed small volume of intravenous gas at the thoracic  inlet, likely from recent IV access. Multilevel thoracic spine ankylosis from flowing anterior endplate osteophytes, T2 through T12 and also T12-L1. Abnormal L1-L2 disc  space including endplate spurring and small volume vacuum disc, but no convincing paraspinal soft tissue inflammation. IMPRESSION: 1. Suspect Left Pectoralis Abscess: abnormal left chest wall with expanded left pectoralis muscle containing some gas and probable intramuscular fluid. And consider Septic Left Sternoclavicular Joint: abnormal soft tissue swelling and punctate soft tissue gas also about the left sternoclavicular joint. No definite osteolysis to indicate osteomyelitis. 2. Superimposed bilateral lower lobe Pneumonia. And Consider Septic Emboli: with multiple indistinct bilateral pulmonary nodules elsewhere. 3. 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 No pleural effusion. 4. Cardiomegaly, new since 2019. Calcified coronary artery and Aortic Atherosclerosis (ICD10-I70.0). Electronically Signed   By: Genevie Ann M.D.   On: 10/25/2021 05:29   CT Cervical Spine Wo Contrast  Result Date: 11/07/2021 CLINICAL DATA:  75 year old male with altered mental status, confusion and head and neck pain. EXAM: CT HEAD WITHOUT CONTRAST CT CERVICAL SPINE WITHOUT CONTRAST TECHNIQUE: Multidetector CT imaging of the head and cervical spine was performed following the standard protocol without intravenous contrast. Multiplanar CT image reconstructions of the cervical spine were also generated. COMPARISON:  05/25/2005 brain MRI and 10/25/2004 CT cervical spine FINDINGS: CT HEAD FINDINGS Brain: No evidence of acute infarction, hemorrhage, hydrocephalus, extra-axial collection or mass lesion/mass effect. Remote bilateral occipital and RIGHT posterior parietal infarcts are noted. Mild chronic small vessel white matter ischemic changes are present. Vascular: Carotid and vertebral atherosclerotic calcifications are noted.  Skull: Normal. Negative for fracture or focal lesion. Sinuses/Orbits: No acute finding. Other: None CT CERVICAL SPINE FINDINGS Alignment: Normal. Skull base and vertebrae: No acute fracture. No primary bone lesion or focal pathologic process. Soft tissues and spinal canal: No prevertebral fluid or swelling. No visible canal hematoma. Disc levels: Anterior fusion changes from C3-C5 noted. Moderate degenerative disc disease/spondylosis from C5-C7 noted. Mild-to-moderate multilevel facet arthropathy is identified. These changes contribute to mild central spinal and mild to moderate biforaminal narrowing. Upper chest: Several LEFT UPPER lobe nodular opacities are noted, not completely imaged or characterized. Other: None IMPRESSION: 1. No evidence of acute intracranial abnormality. Remote bilateral occipital and RIGHT posterior parietal infarcts. Mild chronic small vessel white matter ischemic changes. 2. No static evidence of acute injury to the cervical spine. Degenerative and postsurgical changes as described. 3. LEFT UPPER lobe pulmonary nodular opacities, not completely imaged or characterized. Chest CT with contrast is recommended for further evaluation. Electronically Signed   By: Margarette Canada M.D.   On: 11/07/2021 21:12   CT SHOULDER RIGHT W CONTRAST  Result Date: 11/12/2021 CLINICAL DATA:  Shoulder pain, septic arthritis suspected EXAM: CT OF THE UPPER RIGHT EXTREMITY WITH CONTRAST TECHNIQUE: Multidetector CT imaging of the upper right extremity was performed according to the standard protocol following intravenous contrast administration. CONTRAST:  47mL OMNIPAQUE IOHEXOL 300 MG/ML  SOLN COMPARISON:  None. FINDINGS: Bones/Joint/Cartilage Severe degenerative changes of the glenohumeral joint with extensive juxta-articular subchondral sclerosis, cysts, marginal osteophytes and mild flattening of the glenoid and humeral head. No definite destructive bone lesion identified. Moderate degenerative changes of the  acromioclavicular joint with subchondral cysts and sclerosis with small marginal osteophytes. Old fracture deformity of the humeral diaphysis partially visualized. Evidence of shoulder joint effusion. Ligaments Suboptimally assessed by CT. Muscles and Tendons Suboptimally assessed by CT.  No obvious significant abnormalities. Soft tissues Small right pleural effusion. Chronic coarse pleural calcifications on the right. A few pulmonary nodules identified measuring up to 8 mm in the right middle lobe. A few pleural based consolidative densities scattered throughout the right lung  measuring up to 1.5 cm in the lower lobe. IMPRESSION: 1. No acute osseous abnormality identified. Severe degenerative changes of the shoulder as described. 2. Shoulder joint effusion. Could be reactive secondary to arthritis or could also be seen with septic arthritis, correlate clinically and consider aspiration and sampling if indicated. 3. Several findings in the right lung including small pleural effusion, pleural calcifications, pulmonary nodules. Follow-up chest CT recommended in 3-6 months. Electronically Signed   By: Ofilia Neas M.D.   On: 11/12/2021 10:05   US RENAL  Result Date: 11/22/2021 CLINICAL DATA:  Acute kidney injury.  Left nephrectomy. EXAM: RENAL / URINARY TRACT ULTRASOUND COMPLETE COMPARISON:  None. FINDINGS: Right Kidney: Renal measurements: 13.8 x 7.6 x 7.1 cm = volume: 367 mL. Normal parenchymal echogenicity. No hydronephrosis. No visualized stone or focal lesion. Left Kidney: Surgically absent. Bladder: Appears normal for degree of bladder distention. Other: None. IMPRESSION: 1. Normal sonographic appearance of the right kidney. 2. Post remote left nephrectomy. Electronically Signed   By: Keith Rake M.D.   On: 11/03/2021 21:55   US Venous Img Lower Bilateral (DVT)  Result Date: 11/11/2021 CLINICAL DATA:  Lower extremity lymphedema and cellulitis. EXAM: BILATERAL LOWER EXTREMITY VENOUS DOPPLER  ULTRASOUND TECHNIQUE: Gray-scale sonography with graded compression, as well as color Doppler and duplex ultrasound were performed to evaluate the lower extremity deep venous systems from the level of the common femoral vein and including the common femoral, femoral, profunda femoral, popliteal and calf veins including the posterior tibial, peroneal and gastrocnemius veins when visible. The superficial great saphenous vein was also interrogated. Spectral Doppler was utilized to evaluate flow at rest and with distal augmentation maneuvers in the common femoral, femoral and popliteal veins. COMPARISON:  Prior study on 05/27/2021 FINDINGS: RIGHT LOWER EXTREMITY Common Femoral Vein: No evidence of thrombus. Normal compressibility, respiratory phasicity and response to augmentation. Saphenofemoral Junction: No evidence of thrombus. Normal compressibility and flow on color Doppler imaging. Profunda Femoral Vein: No evidence of thrombus. Normal compressibility and flow on color Doppler imaging. Femoral Vein: No evidence of thrombus. Normal compressibility, respiratory phasicity and response to augmentation. Popliteal Vein: No evidence of thrombus. Normal compressibility, respiratory phasicity and response to augmentation. Calf Veins: No evidence of thrombus. Normal compressibility and flow on color Doppler imaging. Superficial Great Saphenous Vein: No evidence of thrombus. Normal compressibility. Venous Reflux:  None. Other Findings: No evidence of superficial thrombophlebitis or abnormal fluid collection. LEFT LOWER EXTREMITY Common Femoral Vein: No evidence of thrombus. Normal compressibility, respiratory phasicity and response to augmentation. Saphenofemoral Junction: No evidence of thrombus. Normal compressibility and flow on color Doppler imaging. Profunda Femoral Vein: No evidence of thrombus. Normal compressibility and flow on color Doppler imaging. Femoral Vein: No evidence of thrombus. Normal compressibility,  respiratory phasicity and response to augmentation. Popliteal Vein: No evidence of thrombus. Normal compressibility, respiratory phasicity and response to augmentation. Calf Veins: No evidence of thrombus. Normal compressibility and flow on color Doppler imaging. Superficial Great Saphenous Vein: No evidence of thrombus. Normal compressibility. Venous Reflux:  None. Other Findings: No evidence of superficial thrombophlebitis or abnormal fluid collection. IMPRESSION: No evidence of deep venous thrombosis in either lower extremity. Electronically Signed   By: Aletta Edouard M.D.   On: 11/20/2021 15:05   IR US Guide Bx Asp/Drain  Result Date: 10/27/2021 INDICATION: 75 year old with bacteremia and concern for abscesses along the left anterior chest wall musculature. EXAM: Ultrasound-guided aspiration of left anterior chest wall collections MEDICATIONS: Local anesthetic, 1% lidocaine ANESTHESIA/SEDATION: None COMPLICATIONS: None immediate.  PROCEDURE: Informed consent was obtained for ultrasound-guided aspiration and possible drain placement within left chest wall fluid collections. Patient was evaluated with ultrasound. Left anterior chest was prepped with chlorhexidine and sterile field was created. Maximal barrier sterile technique was utilized including caps, mask, sterile gowns, sterile gloves, sterile drape, hand hygiene and skin antiseptic. Skin was anesthetized using 1% lidocaine. Using ultrasound guidance, an 18 gauge needle and a 19 gauge Yueh catheter were directed into multiple areas within the left anterior chest musculature. Initially, needle was directed into the lateral aspect of the chest with small gas collections. Minimal thick bloody fluid was aspirated from these areas. Additional aspirations were performed along the more medial aspect of the chest where small fluid collections were identified with ultrasound. Yueh catheter was directed into the small fluid collections but only 6 ml of thick  bloody fluid could be aspirated. Bandage placed at the puncture sites. FINDINGS: Small irregular fluid collections in the medial left anterior chest. Small amount of bloody fluid was aspirated from these collections. Soft tissue thickening along the lateral aspect of the left anterior chest wall musculature containing multiple foci of gas. Minimal fluid could be aspirated around these areas of gas. IMPRESSION: Complex collections in the left anterior chest wall musculature. Total of 6 mL of bloody thick fluid was aspirated from multiple areas in the left anterior chest wall musculature. Drain was not placed. Electronically Signed   By: Markus Daft M.D.   On: 11/23/2021 16:43   ECHOCARDIOGRAM COMPLETE  Result Date: 11/09/2021    ECHOCARDIOGRAM REPORT   Patient Name:   KOLETON ARCIA Date of Exam: 10/27/2021 Medical Rec #:  [id number]             Height:       72.0 in Accession #:    [id number]            Weight:       258.2 lb Date of Birth:  1946-10-11            BSA:          2.375 m Patient Age:    45 years              BP:           108/66 mmHg Patient Gender: M                     HR:           89 bpm. Exam Location:  [facility] Procedure: 2D Echo, Cardiac Doppler, Color Doppler and Intracardiac            Opacification Agent                                MODIFIED REPORT:      This report was modified by Yolonda Kida MD on 11/09/2021 due to                                 Mod/Severe TR.  Indications:     I51.7 Cardiomegaly  History:         Patient has prior history of Echocardiogram examinations, most                  recent 06/22/2020. CHF, Previous Myocardial Infarction and CAD,  COPD and Stroke; Risk Factors:Diabetes, Dyslipidemia,                  Hypertension and Sleep Apnea. Pneumonia.  Sonographer:     Daphine Deutscher RDCS Referring Phys:  [id number] Diagnosing Phys: Alwyn Pea MD IMPRESSIONS  1. Left ventricular ejection fraction, by estimation, is 60  to 65%. The left ventricle has normal function. The left ventricle has no regional wall motion abnormalities. Left ventricular diastolic parameters were normal.  2. Right ventricular systolic function is normal. The right ventricular size is normal.  3. The mitral valve is normal in structure. No evidence of mitral valve regurgitation.  4. Tricuspid valve regurgitation is moderate to severe.  5. The aortic valve is normal in structure. Aortic valve regurgitation is not visualized. FINDINGS  Left Ventricle: Left ventricular ejection fraction, by estimation, is 60 to 65%. The left ventricle has normal function. The left ventricle has no regional wall motion abnormalities. Definity contrast agent was given IV to delineate the left ventricular  endocardial borders. The left ventricular internal cavity size was normal in size. There is borderline concentric left ventricular hypertrophy. Left ventricular diastolic parameters were normal. Right Ventricle: The right ventricular size is normal. No increase in right ventricular wall thickness. Right ventricular systolic function is normal. Left Atrium: Left atrial size was normal in size. Right Atrium: Right atrial size was normal in size. Pericardium: There is no evidence of pericardial effusion. Mitral Valve: The mitral valve is normal in structure. No evidence of mitral valve regurgitation. Tricuspid Valve: The tricuspid valve is normal in structure. Tricuspid valve regurgitation is moderate to severe. Aortic Valve: The aortic valve is normal in structure. Aortic valve regurgitation is not visualized. Pulmonic Valve: The pulmonic valve was normal in structure. Pulmonic valve regurgitation is not visualized. Aorta: The ascending aorta was not well visualized. IAS/Shunts: No atrial level shunt detected by color flow Doppler.  LEFT VENTRICLE PLAX 2D LVIDd:         4.30 cm LVIDs:         2.80 cm LV PW:         1.10 cm LV IVS:        1.10 cm LVOT diam:     2.20 cm LV SV:          69 LV SV Index:   29 LVOT Area:     3.80 cm  RIGHT VENTRICLE             IVC RV S prime:     13.40 cm/s  IVC diam: 2.70 cm TAPSE (M-mode): 1.4 cm LEFT ATRIUM           Index        RIGHT ATRIUM           Index LA diam:      4.40 cm 1.85 cm/m   RA Area:     26.10 cm LA Vol (A2C): 74.9 ml 31.54 ml/m  RA Volume:   85.20 ml  35.88 ml/m LA Vol (A4C): 94.6 ml 39.84 ml/m  AORTIC VALVE LVOT Vmax:   97.90 cm/s LVOT Vmean:  71.767 cm/s LVOT VTI:    0.181 m  AORTA Ao Root diam: 3.00 cm Ao Asc diam:  3.90 cm MV E velocity: 107.00 cm/s  TRICUSPID VALVE                             TR Peak grad:  34.3 mmHg                             TR Vmax:        293.00 cm/s                              SHUNTS                             Systemic VTI:  0.18 m                             Systemic Diam: 2.20 cm Yolonda Kida MD Electronically signed by Yolonda Kida MD Signature Date/Time: 11/09/2021/1:25:51 PM    Final (Updated)    ECHO TEE  Result Date: 10/29/2021    TRANSESOPHOGEAL ECHO REPORT   Patient Name:   ZIGMUNT CAUGHELL Date of Exam: 10/26/2021 Medical Rec #:  [id number]             Height:       72.0 in Accession #:    [id number]            Weight:       258.8 lb Date of Birth:  03/28/1946            BSA:          2.377 m Patient Age:    48 years              BP:           127/74 mmHg Patient Gender: M                     HR:           87 bpm. Exam Location:  [facility] Procedure: Transesophageal Echo, Color Doppler, Cardiac Doppler and [place]ine            Contrast Bubble Study Indications:     Endocarditis, bacteremia  History:         Patient has prior history of Echocardiogram examinations, most                  recent 11/07/2021. CHF, CAD, COPD, Stroke, DVT and PAD; Risk                  Factors:Hypertension, Dyslipidemia and Sleep Apnea.  Sonographer:     Charmayne Sheer Referring Phys:  [place] Diagnosing Phys: Ida Rogue MD PROCEDURE: After discussion of the risks and benefits of a TEE, an  informed consent was obtained from the patient. TEE procedure time was 50 minutes. The transesophogeal probe was passed without difficulty through the esophogus of the patient. Imaged were obtained with the patient in a left lateral decubitus position. Local oropharyngeal anesthetic was provided with viscous lidocaine. Sedation performed by performing physician. Image quality was excellent. The patient's vital signs; including heart rate, blood pressure, and oxygen saturation; remained stable throughout the procedure. The patient developed no complications during the procedure. IMPRESSIONS  1. No valve endocarditis  2. Left ventricular ejection fraction, by estimation, is 55 %. The left ventricle has normal function. The left ventricle has no regional wall motion abnormalities. There is mild left ventricular hypertrophy.  3. Right ventricular systolic function is normal. The right ventricular size is normal. There  is moderately elevated pulmonary artery systolic pressure. The estimated right ventricular systolic pressure is 48.4 mmHg.  4. Left atrial size was mildly dilated. No left atrial/left atrial appendage thrombus was detected.  5. The mitral valve is normal in structure. Mild mitral valve regurgitation. No evidence of mitral stenosis.  6. Tricuspid valve regurgitation is moderate.  7. The aortic valve is normal in structure. Aortic valve regurgitation is not visualized. Aortic valve sclerosis/calcification is present, without any evidence of aortic stenosis.  8. The inferior vena cava is normal in size with greater than 50% respiratory variability, suggesting right atrial pressure of 3 mmHg.  9. Agitated saline contrast bubble study was positive with shunting observed within 3-6 cardiac cycles suggestive of interatrial shunt. There is a small patent foramen ovale with predominantly right to left shunting across the atrial septum. Conclusion(s)/Recommendation(s): Normal biventricular function without evidence  of hemodynamically significant valvular heart disease. FINDINGS  Left Ventricle: Left ventricular ejection fraction, by estimation, is 55 to 60%. The left ventricle has normal function. The left ventricle has no regional wall motion abnormalities. The left ventricular internal cavity size was normal in size. There is  mild left ventricular hypertrophy. Right Ventricle: The right ventricular size is normal. No increase in right ventricular wall thickness. Right ventricular systolic function is normal. There is moderately elevated pulmonary artery systolic pressure. The tricuspid regurgitant velocity is 3.10 m/s, and with an assumed right atrial pressure of 10 mmHg, the estimated right ventricular systolic pressure is 48.4 mmHg. Left Atrium: Left atrial size was mildly dilated. No left atrial/left atrial appendage thrombus was detected. Right Atrium: Right atrial size was normal in size. Pericardium: There is no evidence of pericardial effusion. Mitral Valve: The mitral valve is normal in structure. Mild mitral valve regurgitation. No evidence of mitral valve stenosis. Tricuspid Valve: The tricuspid valve is normal in structure. Tricuspid valve regurgitation is moderate . No evidence of tricuspid stenosis. Aortic Valve: The aortic valve is normal in structure. Aortic valve regurgitation is not visualized. Aortic valve sclerosis/calcification is present, without any evidence of aortic stenosis. Pulmonic Valve: The pulmonic valve was normal in structure. Pulmonic valve regurgitation is mild. No evidence of pulmonic stenosis. Aorta: The aortic root is normal in size and structure. There is minimal (Grade I) atheroma plaque involving the descending aorta. Venous: The inferior vena cava is normal in size with greater than 50% respiratory variability, suggesting right atrial pressure of 3 mmHg. IAS/Shunts: No atrial level shunt detected by color flow Doppler. Agitated saline contrast was given intravenously to evaluate for  intracardiac shunting. Agitated saline contrast bubble study was positive with shunting observed within 3-6 cardiac cycles suggestive of interatrial shunt. A small patent foramen ovale is detected with predominantly right to left shunting across the atrial septum. There is no evidence of an atrial septal defect.  TRICUSPID VALVE TR Peak grad:   38.4 mmHg TR Vmax:        310.00 cm/s Julien Nordmann MD Electronically signed by Julien Nordmann MD Signature Date/Time: 11/04/2021/6:33:04 PM    Final    US SCROTUM W/DOPPLER  Result Date: 10/24/2021 CLINICAL DATA:  Scrotal pain and swelling for 10 days. EXAM: SCROTAL ULTRASOUND DOPPLER ULTRASOUND OF THE TESTICLES TECHNIQUE: Complete ultrasound examination of the testicles, epididymis, and other scrotal structures was performed. Color and spectral Doppler ultrasound were also utilized to evaluate blood flow to the testicles. COMPARISON:  None. FINDINGS: Right testicle Measurements: 2.7 x 2.9 x 2.9 cm. No mass or microlithiasis visualized. Left testicle Measurements: 3.4  x 2.7 x 2.4 cm. No mass or microlithiasis visualized. Right epididymis:  Normal in size and appearance. Left epididymis:  Normal in size and appearance. Hydrocele:  Small to moderate hydroceles are seen bilaterally. Varicocele:  None visualized. Pulsed Doppler interrogation of both testes demonstrates normal low resistance arterial and venous waveforms bilaterally. Other: Marked diffuse scrotal wall thickening is seen bilaterally. No significant increased blood flow is seen within the scrotal wall on color Doppler ultrasound. This is suggestive of diffuse scrotal wall edema, although cellulitis cannot definitely be excluded. IMPRESSION: No evidence of testicular mass or torsion. Small to moderate bilateral hydroceles. Marked diffuse scrotal wall thickening, which may be due to soft tissue edema although cellulitis cannot definitely be excluded. Correlation with physical exam findings is recommended.  Electronically Signed   By: Marlaine Hind M.D.   On: 10/24/2021 16:07    Microbiology Recent Results (from the past 240 hour(s))  Culture, blood (routine x 2)     Status: Abnormal   Collection Time: 11/07/21  7:33 PM   Specimen: BLOOD  Result Value Ref Range Status   Specimen Description   Final    BLOOD LEFT ANTECUBITAL Performed at [facility], [address] [place], [place] [address]    Special Requests   Final    BOTTLES DRAWN AEROBIC AND ANAEROBIC Blood Culture adequate volume Performed at [facility], [address] [place], [place] [address]    Culture  Setup Time   Final    GRAM POSITIVE COCCI ANAEROBIC BOTTLE ONLY CRITICAL RESULT CALLED TO, READ BACK BY AND VERIFIED WITH: SHEEMA HALLAJI 11/07/2021 1210 SCS    Culture METHICILLIN RESISTANT STAPHYLOCOCCUS AUREUS (A)  Final   Report Status 11/11/2021 FINAL  Final   Organism ID, Bacteria METHICILLIN RESISTANT STAPHYLOCOCCUS AUREUS  Final      Susceptibility   Methicillin resistant staphylococcus aureus - MIC*    CIPROFLOXACIN <=0.5 SENSITIVE Sensitive     ERYTHROMYCIN >=8 RESISTANT Resistant     GENTAMICIN <=0.5 SENSITIVE Sensitive     OXACILLIN >=4 RESISTANT Resistant     TETRACYCLINE <=1 SENSITIVE Sensitive     VANCOMYCIN <=0.5 SENSITIVE Sensitive     TRIMETH/SULFA <=10 SENSITIVE Sensitive     CLINDAMYCIN <=0.25 SENSITIVE Sensitive     RIFAMPIN <=0.5 SENSITIVE Sensitive     Inducible Clindamycin NEGATIVE Sensitive     * METHICILLIN RESISTANT STAPHYLOCOCCUS AUREUS  Culture, blood (routine x 2)     Status: Abnormal   Collection Time: 11/07/21  7:33 PM   Specimen: BLOOD  Result Value Ref Range Status   Specimen Description   Final    BLOOD RIGHT ANTECUBITAL Performed at [facility], [address] [place], [place] [address]    Special Requests   Final    BOTTLES DRAWN AEROBIC AND ANAEROBIC Blood Culture adequate volume Performed at [facility], [address]  [place], [place] [address]    Culture  Setup Time   Final    GRAM POSITIVE COCCI IN BOTH AEROBIC AND ANAEROBIC BOTTLES CRITICAL RESULT CALLED TO, READ BACK BY AND VERIFIED WITH: SHEEMA HALLAJI @1210  11/06/2021 SCS    Culture (A)  Final    STAPHYLOCOCCUS AUREUS SUSCEPTIBILITIES PERFORMED ON PREVIOUS CULTURE WITHIN THE LAST 5 DAYS. Performed at [facility], [place] [address], [place], [place] [address]    Report Status 11/11/2021 FINAL  Final  Blood Culture ID Panel (Reflexed)     Status: Abnormal   Collection Time: 11/07/21  7:33 PM  Result Value Ref Range Status   Enterococcus faecalis NOT DETECTED NOT DETECTED Final   Enterococcus Faecium NOT DETECTED NOT DETECTED Final   Listeria monocytogenes NOT DETECTED NOT DETECTED Final   Staphylococcus species DETECTED (A) NOT DETECTED Final    Comment: CRITICAL RESULT CALLED TO, READ BACK BY AND VERIFIED WITH: SHEEMA HALLAJI @1210  11/14/2021 SCS    Staphylococcus aureus (BCID) DETECTED (A) NOT DETECTED Final    Comment: Methicillin (oxacillin)-resistant Staphylococcus aureus (MRSA). MRSA is predictably resistant to beta-lactam antibiotics (except ceftaroline). Preferred therapy is vancomycin unless clinically contraindicated. Patient requires contact precautions if  hospitalized. CRITICAL RESULT CALLED TO, READ BACK BY AND VERIFIED WITH: SHEEMA HALLAJI @1210  11/01/2021 SCS    Staphylococcus epidermidis NOT DETECTED NOT DETECTED Final   Staphylococcus lugdunensis NOT DETECTED NOT DETECTED Final   Streptococcus species NOT DETECTED NOT DETECTED Final   Streptococcus agalactiae NOT DETECTED NOT DETECTED Final   Streptococcus pneumoniae NOT DETECTED NOT DETECTED Final   Streptococcus pyogenes NOT DETECTED NOT DETECTED Final   A.calcoaceticus-baumannii NOT DETECTED NOT DETECTED Final   Bacteroides fragilis NOT DETECTED NOT DETECTED Final   Enterobacterales NOT DETECTED NOT DETECTED Final   Enterobacter cloacae complex NOT DETECTED NOT DETECTED Final    Escherichia coli NOT DETECTED NOT DETECTED Final   Klebsiella aerogenes NOT DETECTED NOT DETECTED Final   Klebsiella oxytoca NOT DETECTED NOT DETECTED Final   Klebsiella pneumoniae NOT DETECTED NOT DETECTED Final   Proteus species NOT DETECTED NOT DETECTED Final   Salmonella species NOT DETECTED NOT DETECTED Final   Serratia marcescens NOT DETECTED NOT DETECTED Final   Haemophilus influenzae NOT DETECTED NOT DETECTED Final   Neisseria meningitidis NOT DETECTED NOT DETECTED Final   Pseudomonas aeruginosa NOT DETECTED NOT DETECTED Final   Stenotrophomonas maltophilia NOT DETECTED NOT DETECTED Final   Candida albicans NOT DETECTED NOT DETECTED Final   Candida auris NOT DETECTED NOT DETECTED Final   Candida glabrata NOT DETECTED NOT DETECTED Final   Candida krusei NOT DETECTED NOT DETECTED Final   Candida parapsilosis NOT DETECTED NOT DETECTED Final   Candida tropicalis NOT DETECTED NOT DETECTED Final   Cryptococcus neoformans/gattii NOT DETECTED NOT DETECTED Final   Meth resistant mecA/C and MREJ DETECTED (A) NOT DETECTED Final    Comment: CRITICAL RESULT CALLED TO, READ BACK BY AND VERIFIED WITH: SHEEMA HALLAJI @1210  10/31/2021 SCS Performed at [facility], [address], [place], [place] [address]   Urine Culture     Status: Abnormal   Collection Time: 11/07/21  8:45 PM   Specimen: Urine, Clean Catch  Result Value Ref Range Status   Specimen Description   Final    URINE, CLEAN CATCH Performed at [facility], [address] [place], [place] [address]    Special Requests   Final    NONE Performed at [facility], [address] [place],  [address]    Culture (A)  Final    80,000 COLONIES/mL METHICILLIN RESISTANT STAPHYLOCOCCUS AUREUS   Report Status 11/06/2021 FINAL  Final   Organism ID, Bacteria METHICILLIN RESISTANT STAPHYLOCOCCUS AUREUS (A)  Final      Susceptibility   Methicillin resistant staphylococcus aureus - MIC*     CIPROFLOXACIN <=0.5 SENSITIVE Sensitive     GENTAMICIN <=0.5 SENSITIVE Sensitive     NITROFURANTOIN <=16 SENSITIVE Sensitive     OXACILLIN >=4 RESISTANT Resistant     TETRACYCLINE <=1 SENSITIVE Sensitive     VANCOMYCIN <=0.5 SENSITIVE Sensitive     TRIMETH/SULFA <=10 SENSITIVE Sensitive  CLINDAMYCIN <=0.25 SENSITIVE Sensitive     RIFAMPIN <=0.5 SENSITIVE Sensitive     Inducible Clindamycin NEGATIVE Sensitive     * 80,000 COLONIES/mL METHICILLIN RESISTANT STAPHYLOCOCCUS AUREUS  Resp Panel by RT-PCR (Flu A&B, Covid) Nasopharyngeal Swab     Status: None   Collection Time: 11/17/2021  2:36 AM   Specimen: Nasopharyngeal Swab; Nasopharyngeal(NP) swabs in vial transport medium  Result Value Ref Range Status   SARS Coronavirus 2 by RT PCR NEGATIVE NEGATIVE Final    Comment: (NOTE) SARS-CoV-2 target nucleic acids are NOT DETECTED.  The SARS-CoV-2 RNA is generally detectable in upper respiratory specimens during the acute phase of infection. The lowest concentration of SARS-CoV-2 viral copies this assay can detect is 138 copies/mL. A negative result does not preclude SARS-Cov-2 infection and should not be used as the sole basis for treatment or other patient management decisions. A negative result may occur with  improper specimen collection/handling, submission of specimen other than nasopharyngeal swab, presence of viral mutation(s) within the areas targeted by this assay, and inadequate number of viral copies(<138 copies/mL). A negative result must be combined with clinical observations, patient history, and epidemiological information. The expected result is Negative.  Fact Sheet for Patients:  EntrepreneurPulse.com.au  Fact Sheet for Healthcare Providers:  IncredibleEmployment.be  This test is no t yet approved or cleared by the [place] FDA and  has been authorized for detection and/or diagnosis of SARS-CoV-2 by FDA under an Emergency Use  Authorization (EUA). This EUA will remain  in effect (meaning this test can be used) for the duration of the COVID-19 declaration under Section 564(b)(1) of the Act, 21 U.S.C.section 360bbb-3(b)(1), unless the authorization is terminated  or revoked sooner.       Influenza A by PCR NEGATIVE NEGATIVE Final   Influenza B by PCR NEGATIVE NEGATIVE Final    Comment: (NOTE) The Xpert Xpress SARS-CoV-2/FLU/RSV plus assay is intended as an aid in the diagnosis of influenza from Nasopharyngeal swab specimens and should not be used as a sole basis for treatment. Nasal washings and aspirates are unacceptable for Xpert Xpress SARS-CoV-2/FLU/RSV testing.  Fact Sheet for Patients: EntrepreneurPulse.com.au  Fact Sheet for Healthcare Providers: IncredibleEmployment.be  This test is not yet approved or cleared by the [place] FDA and has been authorized for detection and/or diagnosis of SARS-CoV-2 by FDA under an Emergency Use Authorization (EUA). This EUA will remain in effect (meaning this test can be used) for the duration of the COVID-19 declaration under Section 564(b)(1) of the Act, 21 U.S.C. section 360bbb-3(b)(1), unless the authorization is terminated or revoked.  Performed at [facility], [address] [place], [place] [address]   CULTURE, BLOOD (ROUTINE X 2) w Reflex to ID Panel     Status: Abnormal   Collection Time: 11/09/21  7:22 AM   Specimen: BLOOD  Result Value Ref Range Status   Specimen Description   Final    BLOOD RIGHT WRIST Performed at [facility], [address], [place], [place] [address]    Special Requests   Final    BOTTLES DRAWN AEROBIC AND ANAEROBIC Blood Culture adequate volume Performed at [facility], [address] [place], [place] [address]    Culture  Setup Time   Final    GRAM POSITIVE COCCI ANAEROBIC BOTTLE ONLY CRITICAL VALUE NOTED.  VALUE IS CONSISTENT WITH PREVIOUSLY  REPORTED AND CALLED VALUE.    Culture (A)  Final    STAPHYLOCOCCUS AUREUS SUSCEPTIBILITIES PERFORMED ON PREVIOUS CULTURE WITHIN THE  LAST 5 DAYS. Performed at [facility], [place] [address] [place], [place] [address]    Report Status 11/12/2021 FINAL  Final  CULTURE, BLOOD (ROUTINE X 2) w Reflex to ID Panel     Status: None   Collection Time: 11/09/21  7:30 AM   Specimen: BLOOD RIGHT HAND  Result Value Ref Range Status   Specimen Description BLOOD RIGHT HAND  Final   Special Requests   Final    BOTTLES DRAWN AEROBIC AND ANAEROBIC Blood Culture adequate volume   Culture   Final    NO GROWTH 5 DAYS Performed at [facility], [address] [place], [place] [address]    Report Status 11/14/2021 FINAL  Final  Aerobic/Anaerobic Culture w Gram Stain (surgical/deep wound)     Status: None   Collection Time: 11/09/2021 11:45 AM   Specimen: Wound  Result Value Ref Range Status   Specimen Description   Final    WOUND Performed at [facility], [address], [place], [place] [address]    Special Requests   Final    ABSC CHEST WALL Performed at [facility], [address] [place], [place] [address]    Gram Stain   Final    MODERATE WBC PRESENT,BOTH PMN AND MONONUCLEAR MODERATE GRAM POSITIVE COCCI    Culture   Final    MODERATE METHICILLIN RESISTANT STAPHYLOCOCCUS AUREUS NO ANAEROBES ISOLATED Performed at [facility], [place] [address] [place], [place] [address]    Report Status 11/15/2021 FINAL  Final   Organism ID, Bacteria METHICILLIN RESISTANT STAPHYLOCOCCUS AUREUS  Final      Susceptibility   Methicillin resistant staphylococcus aureus - MIC*    CIPROFLOXACIN <=0.5 SENSITIVE Sensitive     ERYTHROMYCIN >=8 RESISTANT Resistant     GENTAMICIN <=0.5 SENSITIVE Sensitive     OXACILLIN >=4 RESISTANT Resistant     TETRACYCLINE <=1 SENSITIVE Sensitive     VANCOMYCIN <=0.5 SENSITIVE Sensitive     TRIMETH/SULFA <=10 SENSITIVE Sensitive      CLINDAMYCIN <=0.25 SENSITIVE Sensitive     RIFAMPIN <=0.5 SENSITIVE Sensitive     Inducible Clindamycin NEGATIVE Sensitive     * MODERATE METHICILLIN RESISTANT STAPHYLOCOCCUS AUREUS  CULTURE, BLOOD (ROUTINE X 2) w Reflex to ID Panel     Status: None (Preliminary result)   Collection Time: 11/12/21  4:05 AM   Specimen: BLOOD  Result Value Ref Range Status   Specimen Description BLOOD LEFTH  Final   Special Requests   Final    BOTTLES DRAWN AEROBIC AND ANAEROBIC Blood Culture adequate volume   Culture   Final    NO GROWTH 4 DAYS Performed at [facility], [address] [place], [place] [address]    Report Status PENDING  Incomplete  CULTURE, BLOOD (ROUTINE X 2) w Reflex to ID Panel     Status: None (Preliminary result)   Collection Time: 11/12/21  4:05 AM   Specimen: BLOOD  Result Value Ref Range Status   Specimen Description BLOOD RIGHTA  Final   Special Requests   Final    BOTTLES DRAWN AEROBIC ONLY Blood Culture adequate volume   Culture   Final    NO GROWTH 4 DAYS Performed at [facility], [address], [place], [place] [address]    Report Status PENDING  Incomplete    Lab Basic Metabolic Panel: Recent Labs  Lab 11/02/2021 0536 11/11/21 0803 11/13/21 0526 11/13/21 1251 11/14/21 0026  NA  --   --  158* 160* 159*  159*  K  --   --  3.5  --  3.8  CL  --   --  107  --  109  CO2  --   --  41*  --  40*  GLUCOSE  --   --  158*  --  161*  BUN  --   --  118*  --  111*  CREATININE 1.63* 1.48* 1.80*  --  1.96*  CALCIUM  --   --  9.2  --  9.0   Liver Function Tests: No results for input(s): AST, ALT, ALKPHOS, BILITOT, PROT, ALBUMIN in the last 168 hours. No results for input(s): LIPASE, AMYLASE in the last 168 hours. No results for input(s): AMMONIA in the last 168 hours. CBC: Recent Labs  Lab 11/11/21 0856 11/12/21 0405 11/14/21 0026  WBC 21.2* 20.3* 16.5*  HGB 10.5* 10.1* 10.0*  HCT 34.2* 32.6* 33.8*  MCV 82.2 78.6* 84.1  PLT 175 268 229    Cardiac Enzymes: Recent Labs  Lab 11/11/21 0803  CKTOTAL 28*   Sepsis Labs: Recent Labs  Lab 11/11/21 0856 11/12/21 0405 11/14/21 0026  WBC 21.2* 20.3* 16.5*    Procedures/Operations  none   [facility] Nick[facility] Diel December 15, 2021, 2:17 PM

## 2021-11-24 NOTE — Progress Notes (Signed)
Pt given two boluses overnight for voicing c/o pain when asked. Basal rate increased from 6mg  to 7mg /hr. Pt observed resting comfortably and slightly arousable. Pt is unable to voice anything at this moment, but open eyes when name is called.

## 2021-11-24 DEATH — deceased

## 2021-11-30 ENCOUNTER — Ambulatory Visit: Payer: Medicare HMO | Admitting: Internal Medicine

## 2021-12-12 ENCOUNTER — Telehealth: Payer: Medicare HMO

## 2022-03-10 ENCOUNTER — Ambulatory Visit: Payer: Medicare HMO
